# Patient Record
Sex: Female | Born: 1935 | Race: White | Hispanic: No | Marital: Single | Smoking: Never smoker
Health system: Southern US, Community
[De-identification: ages and names within clinical notes are randomized; demographics above are authoritative.]

## PROBLEM LIST (undated history)

## (undated) DIAGNOSIS — I10 Essential (primary) hypertension: Secondary | ICD-10-CM

## (undated) DIAGNOSIS — I5030 Unspecified diastolic (congestive) heart failure: Secondary | ICD-10-CM

## (undated) DIAGNOSIS — R079 Chest pain, unspecified: Secondary | ICD-10-CM

## (undated) DIAGNOSIS — Z7901 Long term (current) use of anticoagulants: Secondary | ICD-10-CM

## (undated) DIAGNOSIS — R609 Edema, unspecified: Secondary | ICD-10-CM

## (undated) DIAGNOSIS — M1990 Unspecified osteoarthritis, unspecified site: Secondary | ICD-10-CM

## (undated) DIAGNOSIS — I4891 Unspecified atrial fibrillation: Secondary | ICD-10-CM

## (undated) DIAGNOSIS — T884XXA Failed or difficult intubation, initial encounter: Secondary | ICD-10-CM

## (undated) DIAGNOSIS — C50919 Malignant neoplasm of unspecified site of unspecified female breast: Secondary | ICD-10-CM

## (undated) HISTORY — DX: Malignant neoplasm of unspecified site of unspecified female breast: C50.919

## (undated) HISTORY — DX: Unspecified diastolic (congestive) heart failure: I50.30

## (undated) HISTORY — DX: Unspecified atrial fibrillation: I48.91

## (undated) HISTORY — PX: KNEE SURGERY: SHX244

## (undated) HISTORY — DX: Chest pain, unspecified: R07.9

## (undated) HISTORY — DX: Unspecified osteoarthritis, unspecified site: M19.90

## (undated) HISTORY — PX: BREAST LUMPECTOMY: SHX2

## (undated) HISTORY — DX: Long term (current) use of anticoagulants: Z79.01

## (undated) HISTORY — PX: HIP SURGERY: SHX245

## (undated) HISTORY — DX: Essential (primary) hypertension: I10

## (undated) HISTORY — PX: LAMINECTOMY: SHX219

## (undated) HISTORY — DX: Edema, unspecified: R60.9

## (undated) HISTORY — PX: TONSILLECTOMY: SUR1361

---

## 2000-02-22 ENCOUNTER — Emergency Department (HOSPITAL_COMMUNITY): Admission: EM | Admit: 2000-02-22 | Discharge: 2000-02-23 | Payer: Self-pay | Admitting: Emergency Medicine

## 2000-02-23 ENCOUNTER — Emergency Department (HOSPITAL_COMMUNITY): Admission: EM | Admit: 2000-02-23 | Discharge: 2000-02-23 | Payer: Self-pay | Admitting: *Deleted

## 2000-02-24 ENCOUNTER — Emergency Department (HOSPITAL_COMMUNITY): Admission: EM | Admit: 2000-02-24 | Discharge: 2000-02-24 | Payer: Self-pay | Admitting: *Deleted

## 2004-10-20 ENCOUNTER — Ambulatory Visit: Payer: Self-pay | Admitting: Physical Medicine & Rehabilitation

## 2004-10-20 ENCOUNTER — Inpatient Hospital Stay (HOSPITAL_COMMUNITY): Admission: RE | Admit: 2004-10-20 | Discharge: 2004-10-23 | Payer: Self-pay | Admitting: Orthopedic Surgery

## 2004-10-23 ENCOUNTER — Inpatient Hospital Stay
Admission: RE | Admit: 2004-10-23 | Discharge: 2004-11-04 | Payer: Self-pay | Admitting: Physical Medicine & Rehabilitation

## 2006-12-13 ENCOUNTER — Emergency Department (HOSPITAL_COMMUNITY): Admission: EM | Admit: 2006-12-13 | Discharge: 2006-12-14 | Payer: Self-pay | Admitting: Emergency Medicine

## 2007-03-21 ENCOUNTER — Ambulatory Visit: Payer: Self-pay | Admitting: Cardiovascular Disease

## 2007-03-21 ENCOUNTER — Encounter: Payer: Self-pay | Admitting: Cardiovascular Disease

## 2007-03-21 ENCOUNTER — Ambulatory Visit: Payer: Self-pay

## 2007-03-30 ENCOUNTER — Ambulatory Visit: Payer: Self-pay | Admitting: Cardiology

## 2007-03-30 ENCOUNTER — Inpatient Hospital Stay (HOSPITAL_COMMUNITY): Admission: AD | Admit: 2007-03-30 | Discharge: 2007-04-06 | Payer: Self-pay | Admitting: Orthopedic Surgery

## 2007-04-28 ENCOUNTER — Ambulatory Visit: Payer: Self-pay | Admitting: Cardiovascular Disease

## 2007-04-28 LAB — CONVERTED CEMR LAB
INR: 2.2 — ABNORMAL HIGH (ref 0.8–1.0)
Prothrombin Time: 18.6 s — ABNORMAL HIGH (ref 10.9–13.3)

## 2007-05-19 ENCOUNTER — Ambulatory Visit: Payer: Self-pay | Admitting: Cardiology

## 2007-06-09 ENCOUNTER — Ambulatory Visit: Payer: Self-pay | Admitting: Cardiovascular Disease

## 2007-06-09 ENCOUNTER — Ambulatory Visit: Payer: Self-pay | Admitting: Internal Medicine

## 2007-06-23 ENCOUNTER — Ambulatory Visit: Payer: Self-pay | Admitting: Internal Medicine

## 2007-07-05 ENCOUNTER — Ambulatory Visit: Payer: Self-pay | Admitting: Cardiovascular Disease

## 2007-07-05 LAB — CONVERTED CEMR LAB
BUN: 22 mg/dL (ref 6–23)
CO2: 31 meq/L (ref 19–32)
Calcium: 9.3 mg/dL (ref 8.4–10.5)
Chloride: 107 meq/L (ref 96–112)
Creatinine, Ser: 0.8 mg/dL (ref 0.4–1.2)
GFR calc Af Amer: 88 mL/min
GFR calc non Af Amer: 73 mL/min
Glucose, Bld: 98 mg/dL (ref 70–99)
Potassium: 4 meq/L (ref 3.5–5.1)
Pro B Natriuretic peptide (BNP): 286 pg/mL — ABNORMAL HIGH (ref 0.0–100.0)
Sodium: 142 meq/L (ref 135–145)

## 2007-07-19 ENCOUNTER — Ambulatory Visit: Payer: Self-pay | Admitting: Cardiology

## 2007-08-05 ENCOUNTER — Ambulatory Visit: Payer: Self-pay | Admitting: Cardiovascular Disease

## 2007-09-05 ENCOUNTER — Ambulatory Visit: Payer: Self-pay | Admitting: Cardiology

## 2007-09-05 ENCOUNTER — Inpatient Hospital Stay (HOSPITAL_COMMUNITY): Admission: RE | Admit: 2007-09-05 | Discharge: 2007-09-14 | Payer: Self-pay | Admitting: Orthopedic Surgery

## 2007-09-09 ENCOUNTER — Encounter (INDEPENDENT_AMBULATORY_CARE_PROVIDER_SITE_OTHER): Payer: Self-pay | Admitting: Orthopedic Surgery

## 2007-09-12 ENCOUNTER — Ambulatory Visit: Payer: Self-pay | Admitting: Vascular Surgery

## 2007-09-12 ENCOUNTER — Encounter (INDEPENDENT_AMBULATORY_CARE_PROVIDER_SITE_OTHER): Payer: Self-pay | Admitting: Orthopedic Surgery

## 2007-10-21 ENCOUNTER — Ambulatory Visit: Payer: Self-pay | Admitting: Cardiovascular Disease

## 2007-10-21 LAB — CONVERTED CEMR LAB
INR: 1.6 — ABNORMAL HIGH (ref 0.8–1.0)
Prothrombin Time: 17.8 s — ABNORMAL HIGH (ref 10.9–13.3)

## 2007-10-27 ENCOUNTER — Ambulatory Visit: Payer: Self-pay | Admitting: Cardiology

## 2007-11-04 ENCOUNTER — Ambulatory Visit: Payer: Self-pay | Admitting: Cardiovascular Disease

## 2007-11-17 ENCOUNTER — Ambulatory Visit: Payer: Self-pay | Admitting: Cardiology

## 2007-11-17 ENCOUNTER — Ambulatory Visit: Payer: Self-pay

## 2007-11-17 ENCOUNTER — Ambulatory Visit: Payer: Self-pay | Admitting: Cardiovascular Disease

## 2007-11-17 LAB — CONVERTED CEMR LAB
BUN: 25 mg/dL — ABNORMAL HIGH (ref 6–23)
CO2: 31 meq/L (ref 19–32)
Calcium: 9.5 mg/dL (ref 8.4–10.5)
Chloride: 99 meq/L (ref 96–112)
Creatinine, Ser: 0.9 mg/dL (ref 0.4–1.2)
GFR calc Af Amer: 77 mL/min
GFR calc non Af Amer: 64 mL/min
Glucose, Bld: 88 mg/dL (ref 70–99)
Potassium: 3.2 meq/L — ABNORMAL LOW (ref 3.5–5.1)
Sodium: 141 meq/L (ref 135–145)

## 2007-12-08 ENCOUNTER — Ambulatory Visit: Payer: Self-pay | Admitting: Cardiology

## 2007-12-08 ENCOUNTER — Ambulatory Visit: Payer: Self-pay | Admitting: Cardiovascular Disease

## 2007-12-08 LAB — CONVERTED CEMR LAB
BUN: 24 mg/dL — ABNORMAL HIGH (ref 6–23)
CO2: 33 meq/L — ABNORMAL HIGH (ref 19–32)
Calcium: 9.5 mg/dL (ref 8.4–10.5)
Chloride: 104 meq/L (ref 96–112)
Creatinine, Ser: 0.9 mg/dL (ref 0.4–1.2)
GFR calc Af Amer: 77 mL/min
GFR calc non Af Amer: 64 mL/min
Glucose, Bld: 99 mg/dL (ref 70–99)
Potassium: 5 meq/L (ref 3.5–5.1)
Sodium: 142 meq/L (ref 135–145)

## 2007-12-29 ENCOUNTER — Ambulatory Visit: Payer: Self-pay | Admitting: Cardiovascular Disease

## 2008-01-12 ENCOUNTER — Ambulatory Visit: Payer: Self-pay | Admitting: Cardiovascular Disease

## 2008-01-12 ENCOUNTER — Ambulatory Visit: Payer: Self-pay | Admitting: Cardiology

## 2008-01-31 ENCOUNTER — Ambulatory Visit: Payer: Self-pay | Admitting: Cardiovascular Disease

## 2008-02-10 LAB — CONVERTED CEMR LAB: Pap Smear: NORMAL

## 2008-02-16 ENCOUNTER — Ambulatory Visit: Payer: Self-pay | Admitting: Cardiology

## 2008-03-08 ENCOUNTER — Ambulatory Visit: Payer: Self-pay | Admitting: Cardiovascular Disease

## 2008-05-03 ENCOUNTER — Ambulatory Visit: Payer: Self-pay | Admitting: Cardiovascular Disease

## 2008-05-17 ENCOUNTER — Ambulatory Visit: Payer: Self-pay | Admitting: Internal Medicine

## 2008-05-31 ENCOUNTER — Ambulatory Visit: Payer: Self-pay | Admitting: Cardiovascular Disease

## 2008-06-21 ENCOUNTER — Ambulatory Visit: Payer: Self-pay | Admitting: Internal Medicine

## 2008-07-19 ENCOUNTER — Ambulatory Visit: Payer: Self-pay | Admitting: Internal Medicine

## 2008-07-20 ENCOUNTER — Encounter (INDEPENDENT_AMBULATORY_CARE_PROVIDER_SITE_OTHER): Payer: Self-pay | Admitting: *Deleted

## 2008-07-31 ENCOUNTER — Encounter: Payer: Self-pay | Admitting: *Deleted

## 2008-08-03 DIAGNOSIS — I4891 Unspecified atrial fibrillation: Secondary | ICD-10-CM | POA: Insufficient documentation

## 2008-08-03 DIAGNOSIS — I10 Essential (primary) hypertension: Secondary | ICD-10-CM | POA: Insufficient documentation

## 2008-08-03 DIAGNOSIS — I5030 Unspecified diastolic (congestive) heart failure: Secondary | ICD-10-CM

## 2008-08-03 DIAGNOSIS — R609 Edema, unspecified: Secondary | ICD-10-CM | POA: Insufficient documentation

## 2008-08-03 DIAGNOSIS — R079 Chest pain, unspecified: Secondary | ICD-10-CM | POA: Insufficient documentation

## 2008-08-03 DIAGNOSIS — C50919 Malignant neoplasm of unspecified site of unspecified female breast: Secondary | ICD-10-CM | POA: Insufficient documentation

## 2008-08-03 HISTORY — DX: Unspecified diastolic (congestive) heart failure: I50.30

## 2008-08-03 HISTORY — DX: Malignant neoplasm of unspecified site of unspecified female breast: C50.919

## 2008-08-06 ENCOUNTER — Ambulatory Visit: Payer: Self-pay | Admitting: Cardiovascular Disease

## 2008-08-06 DIAGNOSIS — M1990 Unspecified osteoarthritis, unspecified site: Secondary | ICD-10-CM | POA: Insufficient documentation

## 2008-08-15 ENCOUNTER — Ambulatory Visit: Payer: Self-pay | Admitting: Cardiology

## 2008-08-15 LAB — CONVERTED CEMR LAB
POC INR: 2.6
Protime: 19.6

## 2008-09-05 ENCOUNTER — Encounter: Payer: Self-pay | Admitting: *Deleted

## 2008-09-19 ENCOUNTER — Ambulatory Visit: Payer: Self-pay | Admitting: Cardiology

## 2008-09-19 LAB — CONVERTED CEMR LAB
POC INR: 1.8
Prothrombin Time: 16.7 s

## 2008-10-03 ENCOUNTER — Ambulatory Visit: Payer: Self-pay | Admitting: Cardiology

## 2008-10-03 LAB — CONVERTED CEMR LAB
POC INR: 2.3
Prothrombin Time: 18.5 s

## 2008-10-25 ENCOUNTER — Ambulatory Visit: Payer: Self-pay | Admitting: Internal Medicine

## 2008-10-26 ENCOUNTER — Encounter (INDEPENDENT_AMBULATORY_CARE_PROVIDER_SITE_OTHER): Payer: Self-pay | Admitting: *Deleted

## 2008-10-26 LAB — CONVERTED CEMR LAB
BUN: 26 mg/dL — ABNORMAL HIGH (ref 6–23)
Basophils Absolute: 0.1 10*3/uL (ref 0.0–0.1)
Basophils Relative: 0.9 % (ref 0.0–3.0)
CO2: 35 meq/L — ABNORMAL HIGH (ref 19–32)
Calcium: 9.5 mg/dL (ref 8.4–10.5)
Chloride: 100 meq/L (ref 96–112)
Cholesterol: 215 mg/dL — ABNORMAL HIGH (ref 0–200)
Creatinine, Ser: 1 mg/dL (ref 0.4–1.2)
Direct LDL: 139.5 mg/dL
Eosinophils Absolute: 0.2 10*3/uL (ref 0.0–0.7)
Eosinophils Relative: 2.6 % (ref 0.0–5.0)
GFR calc non Af Amer: 56.18 mL/min (ref >60)
Glucose, Bld: 89 mg/dL (ref 70–99)
HCT: 42 % (ref 36.0–46.0)
HDL: 53.9 mg/dL (ref >39.00)
Hemoglobin: 14.6 g/dL (ref 12.0–15.0)
Lymphocytes Relative: 20.1 % (ref 12.0–46.0)
Lymphs Abs: 1.5 10*3/uL (ref 0.7–4.0)
MCHC: 34.7 g/dL (ref 30.0–36.0)
MCV: 94 fL (ref 78.0–100.0)
Monocytes Absolute: 0.9 10*3/uL (ref 0.1–1.0)
Monocytes Relative: 11.5 % (ref 3.0–12.0)
Neutro Abs: 4.8 10*3/uL (ref 1.4–7.7)
Neutrophils Relative %: 64.9 % (ref 43.0–77.0)
Platelets: 206 10*3/uL (ref 150.0–400.0)
Potassium: 4.3 meq/L (ref 3.5–5.1)
RBC: 4.46 M/uL (ref 3.87–5.11)
RDW: 13.4 % (ref 11.5–14.6)
Sodium: 142 meq/L (ref 135–145)
TSH: 3.98 microintl units/mL (ref 0.35–5.50)
Total CHOL/HDL Ratio: 4
Triglycerides: 120 mg/dL (ref 0.0–149.0)
VLDL: 24 mg/dL (ref 0.0–40.0)
WBC: 7.5 10*3/uL (ref 4.5–10.5)

## 2008-10-31 ENCOUNTER — Ambulatory Visit: Payer: Self-pay | Admitting: Internal Medicine

## 2008-10-31 LAB — CONVERTED CEMR LAB: POC INR: 2.8

## 2008-11-14 ENCOUNTER — Ambulatory Visit: Payer: Self-pay | Admitting: Internal Medicine

## 2008-11-14 LAB — CONVERTED CEMR LAB: POC INR: 3.2

## 2008-11-21 IMAGING — CR DG SHOULDER 2+V*R*
3 series · 3 of 3 positions shown · non-contrast
Comparison: 10/22/04.

CLINICAL DATA: 84-year-old, osteoarthritis, right shoulder. 
 RIGHT SHOULDER - 3 VIEW:

[x shoulder axillary right]
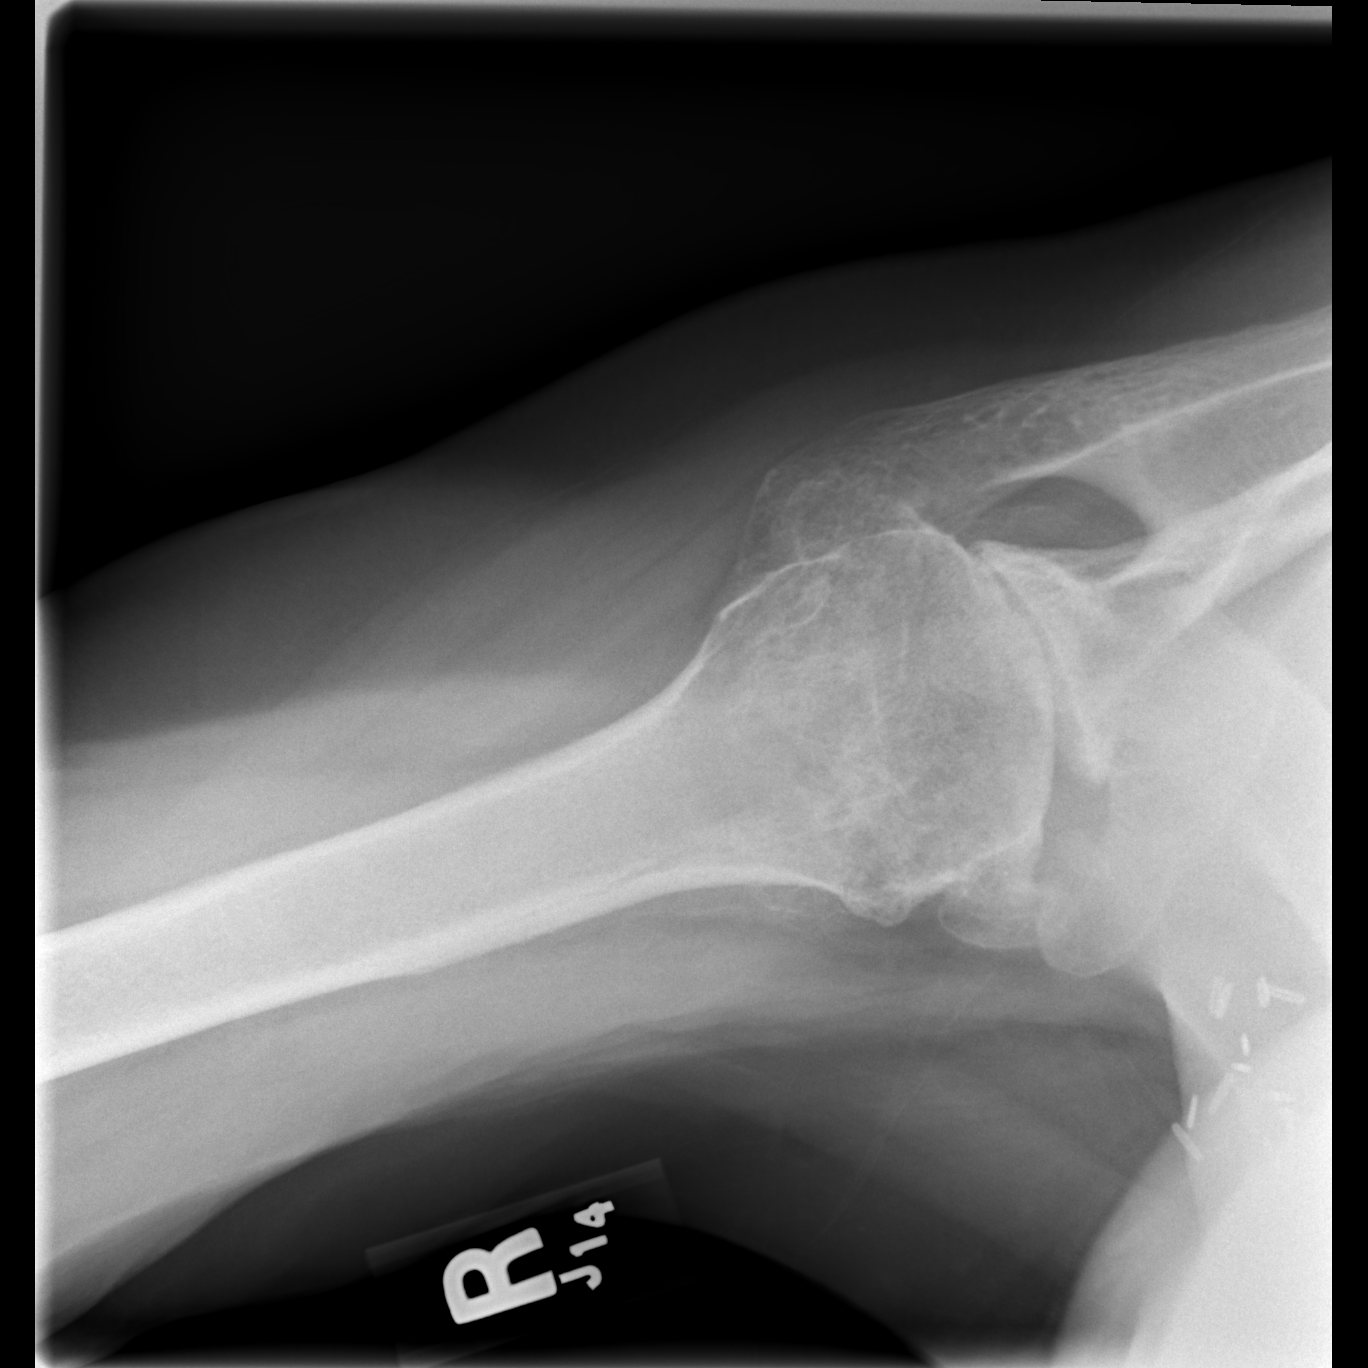

[w shoulder ap internal righ]
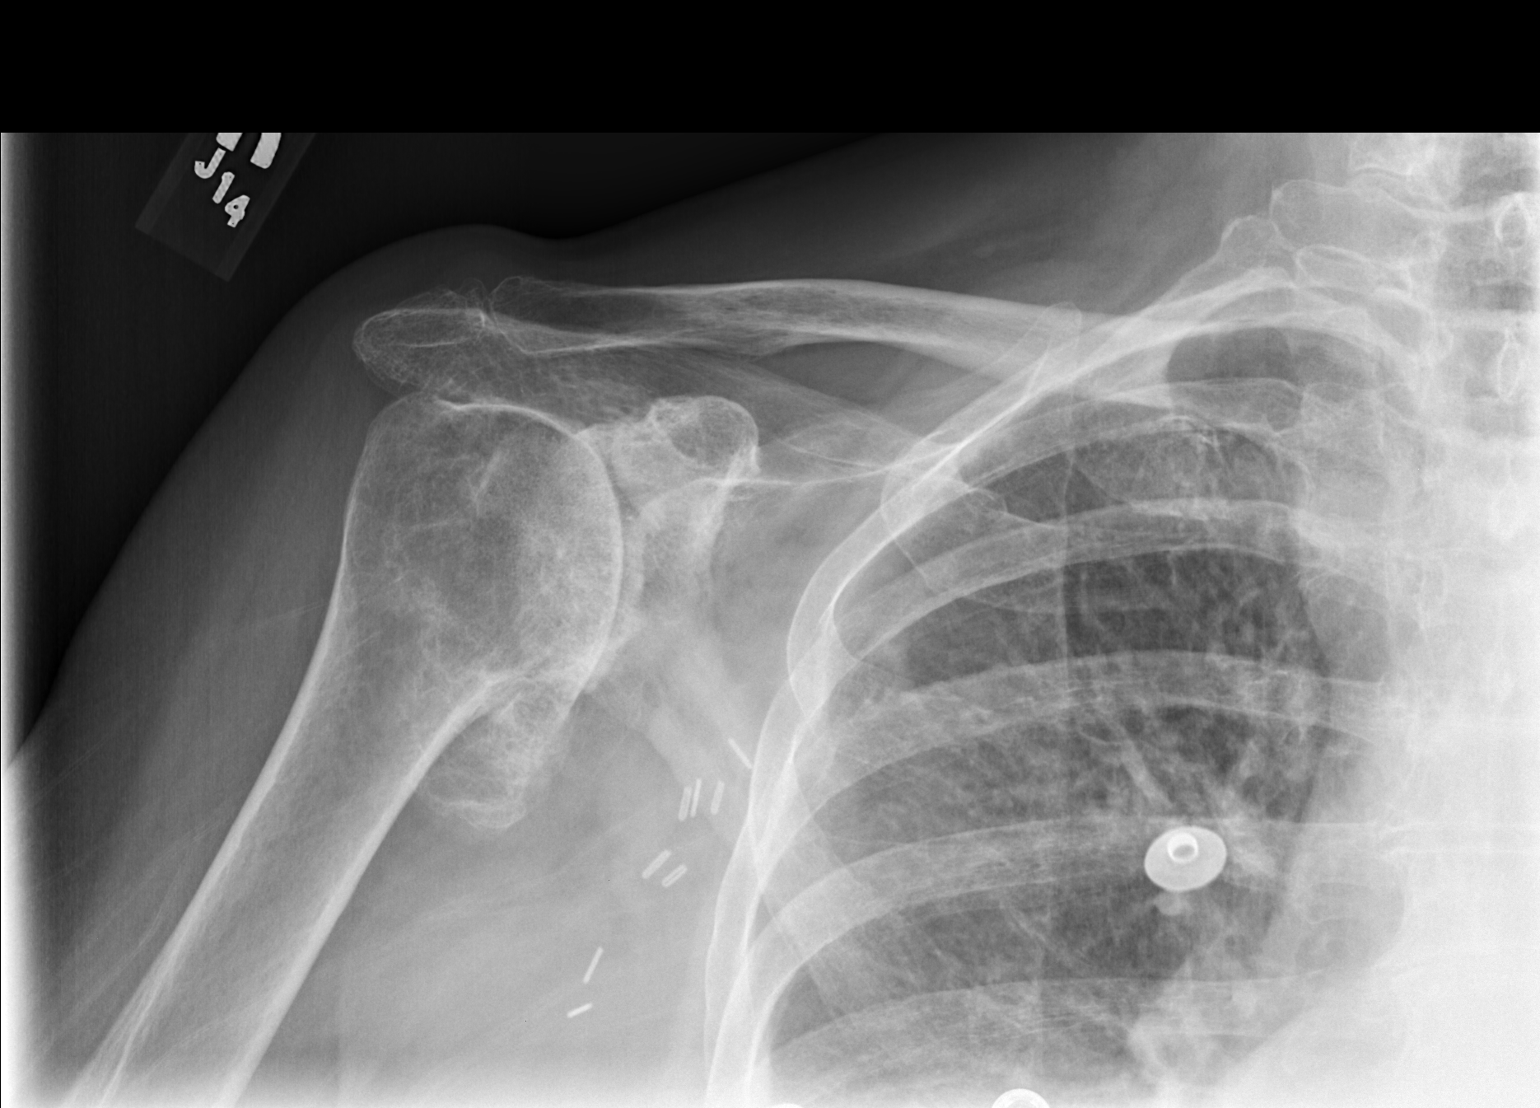

[w shoulder ap external righ]
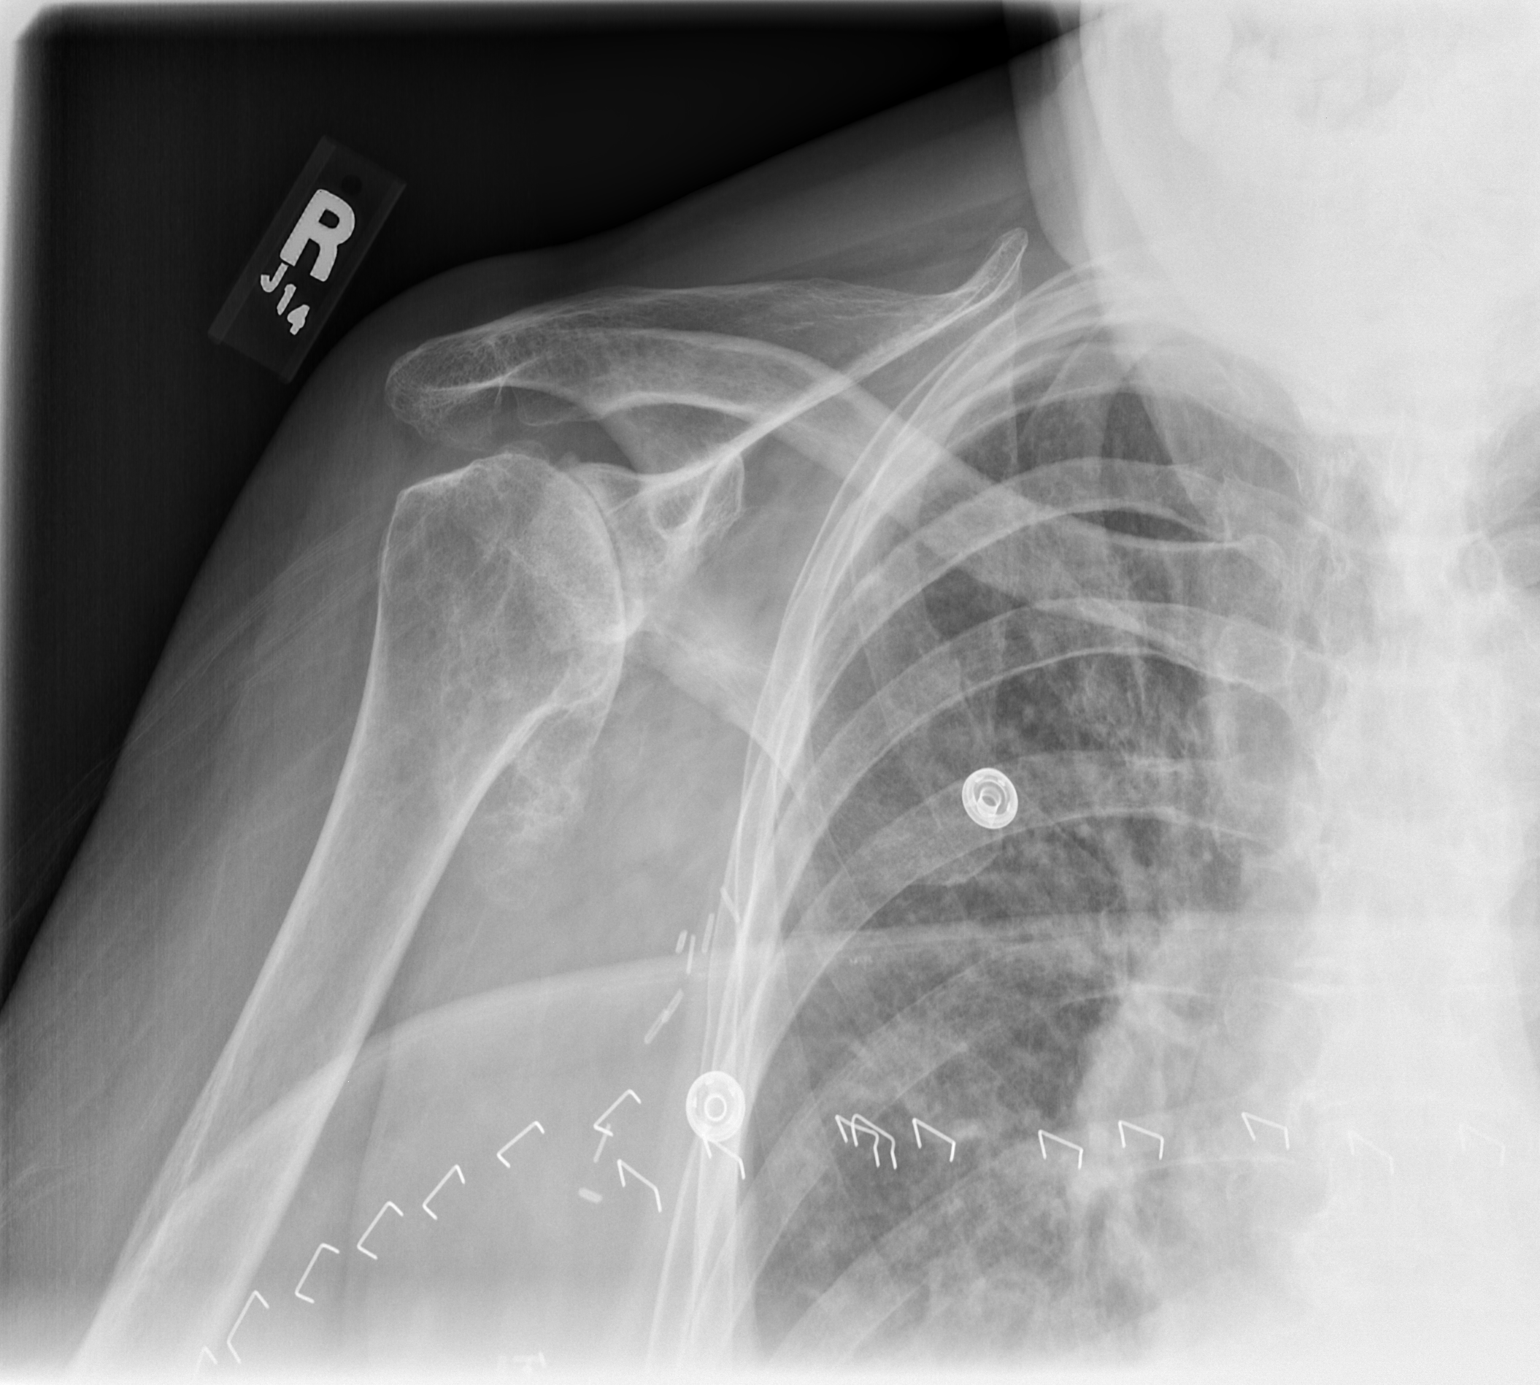

[3 of 3 positions shown; findings below may reference images not displayed]

FINDINGS: Severe and progressive degenerative change involving the right shoulder with marked glenohumeral joint space narrowing, huge osteophytic spur on the inferior aspect of the humeral head, and mild deformity of the humeral head.  No acute bony findings.  The AC joint is intact.  Surgical changes are noted in the right axilla and along the right chest wall.  The clavicle is intact, and the right lung apex is clear.
IMPRESSION: 1.  Severe and progressive glenohumeral joint degenerative changes. 
 2.  No acute bony findings.

## 2008-11-22 IMAGING — CR DG HIP COMPLETE 2+V*R*
1 series · 1 of 1 positions shown · non-contrast
Comparison: Portable right hip 2 views ? [REDACTED] 03/30/07.

CLINICAL DATA: Post-op right hip replacement.  Osteoarthritis with increasing groin pain.
DIAGNOSTIC HIP COMPLETE RIGHT ? 2 VIEWS ? 04/05/07:

[view not recorded]
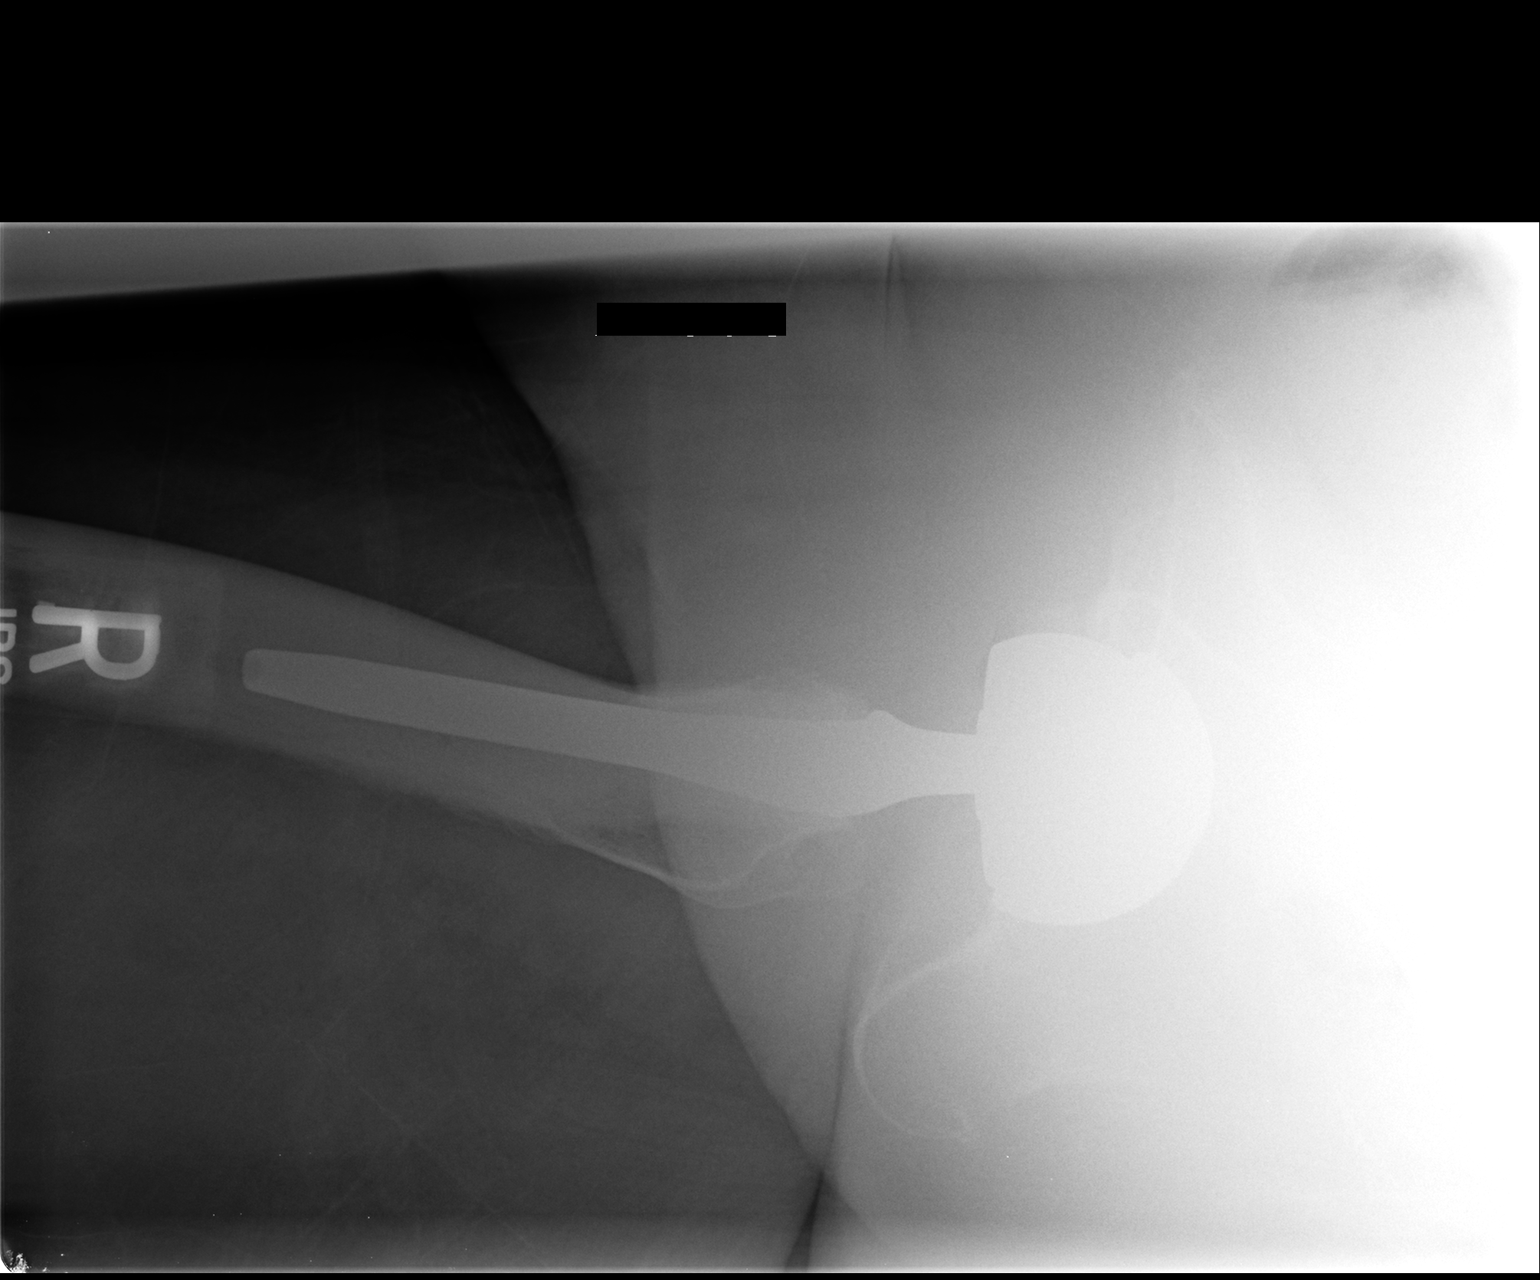

[1 of 1 positions shown; findings below may reference images not displayed]

FINDINGS: Again seen is a total right hip prosthesis in satisfactory position and alignment.  Slight diffuse osteopenia is noted.  The femoral component of the prosthesis is embedded in radiopaque cement.  Visualized right sacroiliac joint and pubic symphysis are unremarkable.  Slight diffuse osteopenia is noted.
IMPRESSION: 1.  Satisfactory position placement right hip prosthesis.
2.  Slight diffuse osteopenia.
3.  No acute findings.

## 2008-12-11 ENCOUNTER — Ambulatory Visit (HOSPITAL_COMMUNITY): Admission: RE | Admit: 2008-12-11 | Discharge: 2008-12-11 | Payer: Self-pay | Admitting: Orthopedic Surgery

## 2008-12-19 ENCOUNTER — Encounter (INDEPENDENT_AMBULATORY_CARE_PROVIDER_SITE_OTHER): Payer: Self-pay | Admitting: *Deleted

## 2008-12-19 ENCOUNTER — Ambulatory Visit: Payer: Self-pay | Admitting: Internal Medicine

## 2008-12-19 LAB — CONVERTED CEMR LAB: POC INR: 2.8

## 2008-12-21 ENCOUNTER — Encounter: Admission: RE | Admit: 2008-12-21 | Discharge: 2008-12-21 | Payer: Self-pay | Admitting: Orthopedic Surgery

## 2009-01-17 ENCOUNTER — Ambulatory Visit: Payer: Self-pay | Admitting: Cardiology

## 2009-01-17 LAB — CONVERTED CEMR LAB: POC INR: 2.3

## 2009-02-06 ENCOUNTER — Encounter: Payer: Self-pay | Admitting: Cardiovascular Disease

## 2009-02-13 ENCOUNTER — Ambulatory Visit: Payer: Self-pay | Admitting: Cardiology

## 2009-02-13 LAB — CONVERTED CEMR LAB: POC INR: 3.6

## 2009-02-27 ENCOUNTER — Ambulatory Visit: Payer: Self-pay | Admitting: Cardiology

## 2009-02-27 LAB — CONVERTED CEMR LAB: POC INR: 2.3

## 2009-03-20 ENCOUNTER — Ambulatory Visit: Payer: Self-pay | Admitting: Cardiovascular Disease

## 2009-03-20 LAB — CONVERTED CEMR LAB: POC INR: 2.5

## 2009-04-17 ENCOUNTER — Ambulatory Visit: Payer: Self-pay | Admitting: Internal Medicine

## 2009-04-17 LAB — CONVERTED CEMR LAB: POC INR: 2.5

## 2009-05-22 ENCOUNTER — Ambulatory Visit: Payer: Self-pay | Admitting: Internal Medicine

## 2009-05-22 LAB — CONVERTED CEMR LAB: POC INR: 3.1

## 2009-06-26 ENCOUNTER — Ambulatory Visit: Payer: Self-pay | Admitting: Internal Medicine

## 2009-06-26 LAB — CONVERTED CEMR LAB: POC INR: 2.1

## 2009-07-17 ENCOUNTER — Ambulatory Visit: Payer: Self-pay | Admitting: Cardiology

## 2009-07-17 LAB — CONVERTED CEMR LAB: POC INR: 2.4

## 2009-07-31 ENCOUNTER — Telehealth (INDEPENDENT_AMBULATORY_CARE_PROVIDER_SITE_OTHER): Payer: Self-pay | Admitting: *Deleted

## 2009-08-08 ENCOUNTER — Telehealth: Payer: Self-pay | Admitting: Internal Medicine

## 2009-08-09 ENCOUNTER — Ambulatory Visit: Payer: Self-pay | Admitting: Cardiovascular Disease

## 2009-08-09 LAB — CONVERTED CEMR LAB: POC INR: 2

## 2009-09-06 ENCOUNTER — Ambulatory Visit: Payer: Self-pay | Admitting: Cardiology

## 2009-09-06 LAB — CONVERTED CEMR LAB: POC INR: 3.1

## 2009-09-26 ENCOUNTER — Ambulatory Visit: Payer: Self-pay | Admitting: Internal Medicine

## 2009-09-26 ENCOUNTER — Ambulatory Visit: Payer: Self-pay | Admitting: Cardiovascular Disease

## 2009-09-26 LAB — CONVERTED CEMR LAB: POC INR: 2.8

## 2009-10-22 ENCOUNTER — Ambulatory Visit: Payer: Self-pay | Admitting: Cardiovascular Disease

## 2009-11-20 ENCOUNTER — Ambulatory Visit: Payer: Self-pay | Admitting: Cardiology

## 2009-11-20 LAB — CONVERTED CEMR LAB: POC INR: 2.2

## 2009-12-11 ENCOUNTER — Ambulatory Visit: Payer: Self-pay | Admitting: Cardiology

## 2009-12-11 LAB — CONVERTED CEMR LAB: POC INR: 2.5

## 2010-01-15 ENCOUNTER — Ambulatory Visit: Payer: Self-pay | Admitting: Cardiology

## 2010-01-15 LAB — CONVERTED CEMR LAB
INR: 1.7
POC INR: 1.7

## 2010-01-21 ENCOUNTER — Encounter: Payer: Self-pay | Admitting: Cardiovascular Disease

## 2010-01-29 ENCOUNTER — Ambulatory Visit: Payer: Self-pay | Admitting: Internal Medicine

## 2010-01-29 LAB — CONVERTED CEMR LAB: POC INR: 3

## 2010-02-19 ENCOUNTER — Ambulatory Visit: Payer: Self-pay | Admitting: Cardiology

## 2010-02-19 LAB — CONVERTED CEMR LAB: POC INR: 3.2

## 2010-03-06 ENCOUNTER — Ambulatory Visit: Admission: RE | Admit: 2010-03-06 | Discharge: 2010-03-06 | Payer: Self-pay | Source: Home / Self Care

## 2010-03-06 LAB — CONVERTED CEMR LAB: POC INR: 3.3

## 2010-03-20 ENCOUNTER — Ambulatory Visit: Admission: RE | Admit: 2010-03-20 | Discharge: 2010-03-20 | Payer: Self-pay | Source: Home / Self Care

## 2010-03-20 LAB — CONVERTED CEMR LAB: POC INR: 3

## 2010-03-27 ENCOUNTER — Encounter
Admission: RE | Admit: 2010-03-27 | Discharge: 2010-03-27 | Payer: Self-pay | Source: Home / Self Care | Attending: Orthopedic Surgery | Admitting: Orthopedic Surgery

## 2010-04-01 NOTE — Progress Notes (Signed)
Summary: dental procedure  Phone Note From Other Clinic Call back at [phone]   Caller: Marley Call For: Coumadin Clinic Summary of Call: Marley from Dr. Annabelle Harman' office (dentist) in [facility] called.  They need to extract a tooth and do a filling for pt.  Wanted to make sure this was okay since she was on Coumadin.  Instructed them that as long as it was a simple extraction, pt would be fine staying on Coumadin for procedure.  They are going to wait until pt has INR checked next Friday to schedule the procedure.  Initial call taken by: Weston Brass PharmD,  July 31, 2009 2:38 PM

## 2010-04-01 NOTE — Medication Information (Signed)
Summary: [name]  Anticoagulant Therapy  Managed by: Bethena Midget, RN, BSN Referring MD: Charlton Haws MD PCP: Newt Lukes MD Supervising MD: Johney Frame MD, Fayrene Fearing Indication 1: Atrial Fibrillation (ICD-427.31) Lab Used: [facility] Site: [facility] INR POC 2.5 INR RANGE 2 - 3  Dietary changes: no    Health status changes: no    Bleeding/hemorrhagic complications: no    Recent/future hospitalizations: no    Any changes in medication regimen? no    Recent/future dental: no  Any missed doses?: no       Is patient compliant with meds? yes       Allergies: 1)  Demerol 2)  Percocet  Anticoagulation Management History:      The patient is taking warfarin and comes in today for a routine follow up visit.  Positive risk factors for bleeding include an age of 75 years or older.  The bleeding index is 'intermediate risk'.  Positive CHADS2 values include History of CHF, History of HTN, and Age > 4 years old.  The start date was 04/22/2007.  Her last INR was 1.6 RATIO.  Anticoagulation responsible provider: Eudell Julian MD, Fayrene Fearing.  INR POC: 2.5.  Cuvette Lot#: [id number].  Exp: 06/2010.    Anticoagulation Management Assessment/Plan:      The patient's current anticoagulation dose is Warfarin sodium 6 mg tabs: Use as directed by Anticoagulation Clinic.  The target INR is 2 - 3.  The next INR is due 05/22/2009.  Anticoagulation instructions were given to patient.  Results were reviewed/authorized by Bethena Midget, RN, BSN.  She was notified by Bethena Midget, RN, BSN.         Prior Anticoagulation Instructions: INR 2.5  Continue same dose of 1.5 tablets daily except 1 tablet on Tuesdays, Thursdays, and Saturdays. Recheck in 4 weeks.  Current Anticoagulation Instructions: INR 2.5 Continue 9mg s daily except 6mg s on Tuesdays, Thursdays and Saturdays. Recheck in 4 weeks.

## 2010-04-01 NOTE — Medication Information (Signed)
Summary: rov/tm  Anticoagulant Therapy  Managed by: Bethena Midget, RN, BSN Referring MD: Charlton Haws MD PCP: Newt Lukes MD Supervising MD: Gala Romney MD, Reuel Boom Indication 1: Atrial Fibrillation (ICD-427.31) Lab Used: [facility] Site: [facility] INR POC 2.1 INR RANGE 2 - 3  Dietary changes: no    Health status changes: no    Bleeding/hemorrhagic complications: no    Recent/future hospitalizations: no    Any changes in medication regimen? no    Recent/future dental: no  Any missed doses?: no         Allergies: 1)  Demerol 2)  Percocet  Anticoagulation Management History:      The patient is taking warfarin and comes in today for a routine follow up visit.  Positive risk factors for bleeding include an age of 75 years or older.  The bleeding index is 'intermediate risk'.  Positive CHADS2 values include History of CHF, History of HTN, and Age > 50 years old.  The start date was 04/22/2007.  Her last INR was 1.6 RATIO.  Anticoagulation responsible provider: Almond Fitzgibbon MD, Reuel Boom.  INR POC: 2.1.  Cuvette Lot#: [id number].  Exp: 08/2010.    Anticoagulation Management Assessment/Plan:      The patient's current anticoagulation dose is Warfarin sodium 6 mg tabs: Take 1 1/2 tablet every day except 1 tablet on Tuesday, Thursday and Saturday or as directed by Anticoagulation Clinic.  The target INR is 2 - 3.  The next INR is due 07/17/2009.  Anticoagulation instructions were given to patient.  Results were reviewed/authorized by Bethena Midget, RN, BSN.  She was notified by Bethena Midget, RN, BSN.         Prior Anticoagulation Instructions: INR 3.1 Today take only 3mg s then resume 9mg s daily except 6mg s on Tuesdays,  Thursdays and Saturdays. Recheck in 4 weeks.   Current Anticoagulation Instructions: INR 2.1 Continue 9mg s daily except 6mg s on Tuesdays, Thursdays and Saturdays. Recheck in 3 weeks.

## 2010-04-01 NOTE — Medication Information (Signed)
Summary: rov/lr  Anticoagulant Therapy  Managed by: Shelby Dubin, PharmD, BCPS, CPP Referring MD: Charlton Haws MD Supervising MD: Daleen Squibb MD, Maisie Fus Indication 1: Atrial Fibrillation (ICD-427.31) Lab Used: [facility] Site: [facility] PT 16.7 INR POC 1.8 INR RANGE 2 - 3  Dietary changes: no    Health status changes: no    Bleeding/hemorrhagic complications: no    Recent/future hospitalizations: no    Any changes in medication regimen? no    Recent/future dental: no  Any missed doses?: no       Is patient compliant with meds? yes       Current Medications (verified): 1)  Vitamin C 500 Mg Tabs (Ascorbic Acid) .Marland Kitchen.. 1 Tab By Mouth Once Daily 2)  Calcium 500 Mg Tabs (Calcium Carbonate) .... 2 Tab By Mouth Once Daily 3)  Cvs Vitamin D 2000 Unit Tabs (Cholecalciferol) .Marland Kitchen.. 1 Tab By Mouth Once Daily 4)  Folic Acid .Marland Kitchen.. 1 Tab By Mouth Once Daily 5)  Digoxin 0.125 Mg Tabs (Digoxin) .Marland Kitchen.. 1 Tab By Mouth Once Daily 6)  Metoprolol Succinate 50 Mg Xr24h-Tab (Metoprolol Succinate) .Marland Kitchen.. 1 and 1/2 Two Times A Day 7)  Coumadin 6 Mg Tabs (Warfarin Sodium) .Marland Kitchen.. 1 Tab Tues, Thur. Sat 1 1/2 The Rest of The Week 8)  Oscal 500/200 D-3 500-200 Mg-Unit Tabs (Calcium-Vitamin D) .... 2 Tabs By Mouth Once Daily 9)  Fish Oil   Oil (Fish Oil) .Marland Kitchen.. 1 Tab By Mouth Once Daily 10)  Glucosamine-Chondroitin   Caps (Glucosamine-Chondroit-Vit C-Mn) .Marland Kitchen.. 1 Tab By Mouth Once Daily 11)  Torsemide 20 Mg Tabs (Torsemide) .... 2 Tabs By Mouth Once Daily 12)  Potassium Chloride Crys Cr 20 Meq Cr-Tabs (Potassium Chloride Crys Cr) .... 2 Tabs By Mouth Once Daily 13)  Klor-Con M20 20 Meq Cr-Tabs (Potassium Chloride Crys Cr) .Marland Kitchen.. 1 Tablet By Mouth Twice A Day 14)  Toprol Xl 50 Mg Xr24h-Tab (Metoprolol Succinate) .... Take 1 1/2 Tablet By Mouth Twice A Day 15)  Warfarin Sodium 6 Mg Tabs (Warfarin Sodium) .... Take As Directed  Allergies (verified): 1)  Demerol 2)  Percocet  Anticoagulation Management History:  The patient is taking warfarin and comes in today for a routine follow up visit.  Positive risk factors for bleeding include an age of 75 years or older.  The bleeding index is 'intermediate risk'.  Positive CHADS2 values include History of CHF, History of HTN, and Age > 75 years old.  The start date was 04/22/2007.  Her last INR was 1.6 RATIO.  Prothrombin time is 16.7.  Anticoagulation responsible provider: Daleen Squibb MD, Maisie Fus.  INR POC: 1.8.  Cuvette Lot#: [id number].  Exp: 07/11.    Anticoagulation Management Assessment/Plan:      The patient's current anticoagulation dose is Coumadin 6 mg tabs: 1 tab tues, thur. sat 1 1/2 the rest of the week, Warfarin sodium 6 mg tabs: Take as directed.  The target INR is 2 - 3.  The next INR is due 10/03/2008.  Anticoagulation instructions were given to patient.  Results were reviewed/authorized by Shelby Dubin, PharmD, BCPS, CPP.  She was notified by Cloyde Reams RN.         Prior Anticoagulation Instructions: Continue 9mg  once daily except 6mg  on Tues, Thurs, Sat.  Current Anticoagulation Instructions: INR 1.8  Take 2 tablets today then resume 1 and 1/2 tablets daily except 1 tablet on Tuesdays, Thursdays and Saturdays.  Recheck in 2 weeks.  Appended Document: rov/lr  Reviewed Juanito Doom, MD

## 2010-04-01 NOTE — Medication Information (Signed)
Summary: rov/tm  Anticoagulant Therapy  Managed by: Weston Brass, PharmD Referring MD: Charlton Haws MD PCP: Newt Lukes MD Supervising MD: Clifton James MD, Cristal Deer Indication 1: Atrial Fibrillation (ICD-427.31) Lab Used: [facility] Site: [facility] INR POC 2.0 INR RANGE 2 - 3  Dietary changes: no    Health status changes: no    Bleeding/hemorrhagic complications: no    Recent/future hospitalizations: no    Any changes in medication regimen? no    Recent/future dental: yes     Details: having tooth pulled in near future; not scheduled yet  Any missed doses?: no       Is patient compliant with meds? yes       Allergies: 1)  Demerol 2)  Percocet  Anticoagulation Management History:      The patient is taking warfarin and comes in today for a routine follow up visit.  Positive risk factors for bleeding include an age of 75 years or older.  The bleeding index is 'intermediate risk'.  Positive CHADS2 values include History of CHF, History of HTN, and Age > 30 years old.  The start date was 04/22/2007.  Her last INR was 1.6 RATIO.  Anticoagulation responsible provider: Clifton James MD, Cristal Deer.  INR POC: 2.0.  Cuvette Lot#: [id number].  Exp: 10/2010.    Anticoagulation Management Assessment/Plan:      The patient's current anticoagulation dose is Warfarin sodium 6 mg tabs: Take 1 1/2 tablet every day except 1 tablet on Tuesday, Thursday and Saturday or as directed by Anticoagulation Clinic.  The target INR is 2 - 3.  The next INR is due 09/06/2009.  Anticoagulation instructions were given to patient.  Results were reviewed/authorized by Weston Brass, PharmD.  She was notified by Weston Brass PharmD.         Prior Anticoagulation Instructions: INR 2.4 Continue 9mg  daily except 6mg  on Tuesdays, Thursdays and Saturdays. Recheck in 4 weeks.  Current Anticoagulation Instructions: INR 2.0  Continue same dose of 1 1/2 tablets every day except 1 tablet on Tuesday, Thursday and  Saturday.

## 2010-04-01 NOTE — Medication Information (Signed)
Summary: rov/lr  Anticoagulant Therapy  Managed by: Vashti Hey, RN Referring MD: Charlton Haws MD Supervising MD: Shirlee Latch MD, Areliz Rothman Indication 1: Atrial Fibrillation (ICD-427.31) Lab Used: [facility] 19.6 INR POC 2.6  Dietary changes: no    Health status changes: no    Bleeding/hemorrhagic complications: no    Recent/future hospitalizations: no    Any changes in medication regimen? no    Recent/future dental: no  Any missed doses?: no       Is patient compliant with meds? yes       Allergies (verified): 1)  Demerol 2)  Percocet  Anticoagulation Management History:      Positive risk factors for bleeding include an age of 75 years or older.  The bleeding index is 'intermediate risk'.  Positive CHADS2 values include History of CHF, History of HTN, and Age > 73 years old.  The start date was 04/22/2007.  Her last INR was 1.6 RATIO.    Anticoagulation Management Assessment/Plan:      The patient's current anticoagulation dose is Coumadin 6 mg tabs: 1 tab tues, thur. sat 1 1/2 the rest of the week, Warfarin sodium 6 mg tabs: Take as directed.  She is to have a 08/20/2008.  Anticoagulation instructions were given to patient.  Results were reviewed/authorized by Vashti Hey, RN.  She was notified by Vashti Hey RN.         Prior Anticoagulation Instructions: 6MG  T,T,SAT/ 9MG  QD  Current Anticoagulation Instructions: Continue 9mg  once daily except 6mg  on Tues, Thurs, Sat.

## 2010-04-01 NOTE — Medication Information (Signed)
Summary: rov/jaj  Anticoagulant Therapy  Managed by: Weston Brass, PharmD Referring MD: Charlton Haws MD PCP: Newt Lukes MD Supervising MD: Daleen Squibb MD, Maisie Fus Indication 1: Atrial Fibrillation (ICD-427.31) Lab Used: [facility] Site: [facility] INR POC 2.5 INR RANGE 2 - 3  Dietary changes: no    Health status changes: no    Bleeding/hemorrhagic complications: no    Recent/future hospitalizations: no    Any changes in medication regimen? no    Recent/future dental: no  Any missed doses?: no       Is patient compliant with meds? yes       Allergies: 1)  Demerol 2)  Percocet  Anticoagulation Management History:      The patient is taking warfarin and comes in today for a routine follow up visit.  Positive risk factors for bleeding include an age of 64 years or older.  The bleeding index is 'intermediate risk'.  Positive CHADS2 values include History of CHF, History of HTN, and Age > 85 years old.  The start date was 04/22/2007.  Her last INR was 1.6 RATIO.  Anticoagulation responsible provider: Daleen Squibb MD, Maisie Fus.  INR POC: 2.5.  Cuvette Lot#: [id number].  Exp: 01/2011.    Anticoagulation Management Assessment/Plan:      The patient's current anticoagulation dose is Warfarin sodium 6 mg tabs: Take 1 1/2 tablet every day except 1 tablet on Tuesday, Thursday and Saturday or as directed by Anticoagulation Clinic.  The target INR is 2 - 3.  The next INR is due 01/15/2010.  Anticoagulation instructions were given to patient.  Results were reviewed/authorized by Weston Brass, PharmD.  She was notified by Ilean Skill D candidate.         Prior Anticoagulation Instructions: INR 2.2  Continue taking 1 1/2 tablets everyday except take 1 tablet on Tuesdays, Thursdays, and Saturdays. Re-check INR in 4 weeks.   Current Anticoagulation Instructions: INR 2.5  Continue taking 1 1/2 tablets everyday except 1 tablet on Tuesday, Thursday, and Saturday. Recheck in 5 weeks. (pt's brother  requested this and she gets a ride with him)

## 2010-04-01 NOTE — Medication Information (Signed)
Summary: rov/nb  Anticoagulant Therapy  Managed by: Weston Brass, PharmD Referring MD: Charlton Haws MD PCP: Newt Lukes MD Supervising MD: Gala Romney MD, Reuel Boom Indication 1: Atrial Fibrillation (ICD-427.31) Lab Used: [facility] Site: [facility] INR POC 3.0 INR RANGE 2 - 3  Dietary changes: no    Health status changes: no    Bleeding/hemorrhagic complications: no    Recent/future hospitalizations: no    Any changes in medication regimen? yes       Details: taking extra tylenol for pain  Recent/future dental: no  Any missed doses?: no       Is patient compliant with meds? yes       Allergies: 1)  Demerol 2)  Percocet  Anticoagulation Management History:      The patient is taking warfarin and comes in today for a routine follow up visit.  Positive risk factors for bleeding include an age of 1 years or older.  The bleeding index is 'intermediate risk'.  Positive CHADS2 values include History of CHF, History of HTN, and Age > 58 years old.  The start date was 04/22/2007.  Her last INR was 1.7.  Anticoagulation responsible provider: Theophilus Walz MD, Reuel Boom.  INR POC: 3.0.  Cuvette Lot#: [id number].  Exp: 01/2011.    Anticoagulation Management Assessment/Plan:      The patient's current anticoagulation dose is Warfarin sodium 6 mg tabs: Take 1 1/2 tablet every day except 1 tablet on Tuesday, Thursday and Saturday or as directed by Anticoagulation Clinic.  The target INR is 2 - 3.  The next INR is due 02/19/2010.  Anticoagulation instructions were given to patient.  Results were reviewed/authorized by Weston Brass, PharmD.  She was notified by Weston Brass PharmD.         Prior Anticoagulation Instructions: INR 1.7 Patient to increase today dose to 2tabs and tomorrow 1.5 tabs and return to dose of 1.5 Sun, Mon , Wed and Frid and 1tab on Tue and Thur. Recheck in 10-14 days. Continue eating green leafy vegetables as normal each week.  Current Anticoagulation Instructions: INR  3.0  Continue same dose of 1 1/2 tablets every day except 1 tablet on Tuesday, Thursday and Saturday.  Recheck INR in 3 weeks.

## 2010-04-01 NOTE — Medication Information (Signed)
Summary: rov/tm  Anticoagulant Therapy  Managed by: Weston Brass, PharmD Referring MD: Charlton Haws MD PCP: Newt Lukes MD Supervising MD: Clifton James MD, Cristal Deer Indication 1: Atrial Fibrillation (ICD-427.31) Lab Used: [facility] Site: [facility] INR RANGE 2 - 3  Dietary changes: no    Health status changes: no    Bleeding/hemorrhagic complications: no    Recent/future hospitalizations: no    Any changes in medication regimen? no    Recent/future dental: no  Any missed doses?: no       Is patient compliant with meds? yes       Allergies: 1)  Demerol 2)  Percocet  Anticoagulation Management History:      The patient is taking warfarin and comes in today for a routine follow up visit.  Positive risk factors for bleeding include an age of 75 years or older.  The bleeding index is 'intermediate risk'.  Positive CHADS2 values include History of CHF, History of HTN, and Age > 75 years old.  The start date was 04/22/2007.  Her last INR was 1.6 RATIO.  Anticoagulation responsible provider: Clifton James MD, Cristal Deer.  Cuvette Lot#: [id number].  Exp: 12/2010.    Anticoagulation Management Assessment/Plan:      The patient's current anticoagulation dose is Warfarin sodium 6 mg tabs: Take 1 1/2 tablet every day except 1 tablet on Tuesday, Thursday and Saturday or as directed by Anticoagulation Clinic.  The target INR is 2 - 3.  The next INR is due 11/20/2009.  Anticoagulation instructions were given to patient.  Results were reviewed/authorized by Weston Brass, PharmD.  She was notified by Gweneth Fritter, PharmD Candidate.         Prior Anticoagulation Instructions: INR 2.8  Continue same dose of 1.5 tabs daily except for 1 tab on Tuesday, Thursday, and Saturday.  Re-check in 4 weeks. Schedule appt with brother Willadene Mounsey).   Current Anticoagulation Instructions: INR 1.9  Tonight, take 1.5 tablets (9mg ).  Then resume normal schedule of taking 1.5 tablets (9mg ) every day  except take 1 tablet (6mg ) on Tuesday and Thursday.  Recheck in 4 weeks.

## 2010-04-01 NOTE — Medication Information (Signed)
Summary: rov/tm  Anticoagulant Therapy  Managed by: Bethena Midget, RN, BSN Referring MD: Charlton Haws MD PCP: Newt Lukes MD Supervising MD: Antoine Poche MD, Fayrene Fearing Indication 1: Atrial Fibrillation (ICD-427.31) Lab Used: [facility] Site: [facility] INR POC 3.6 INR RANGE 2 - 3  Dietary changes: no    Health status changes: no    Bleeding/hemorrhagic complications: no    Recent/future hospitalizations: no    Any changes in medication regimen? no    Recent/future dental: no  Any missed doses?: no       Is patient compliant with meds? yes       Allergies: 1)  Demerol 2)  Percocet  Anticoagulation Management History:      The patient is taking warfarin and comes in today for a routine follow up visit.  Positive risk factors for bleeding include an age of 75 years or older.  The bleeding index is 'intermediate risk'.  Positive CHADS2 values include History of CHF, History of HTN, and Age > 37 years old.  The start date was 04/22/2007.  Her last INR was 1.6 RATIO.  Anticoagulation responsible provider: Antoine Poche MD, Fayrene Fearing.  INR POC: 3.6.  Cuvette Lot#: [id number].  Exp: 04/2010.    Anticoagulation Management Assessment/Plan:      The patient's current anticoagulation dose is Warfarin sodium 6 mg tabs: Use as directed by Anticoagulation Clinic.  The target INR is 2 - 3.  The next INR is due 02/27/2009.  Anticoagulation instructions were given to patient.  Results were reviewed/authorized by Bethena Midget, RN, BSN.  She was notified by Bethena Midget, RN, BSN.         Prior Anticoagulation Instructions: INR 2.3 Continue 9mg s daily except 6mg s on Tuesdays, Thursdays and Saturdays. Recheck in 4 weeks.   Current Anticoagulation Instructions: INR 3.6 Skip todays dose, then resume 9mg s daily except 6mg s on Tuesdays, Thursdays and Saturdays. Recheck in 2 weeks.

## 2010-04-01 NOTE — Medication Information (Signed)
Summary: rov/td  Anticoagulant Therapy  Managed by: Bethena Midget, RN, BSN Referring MD: Charlton Haws MD PCP: Newt Lukes MD Supervising MD: Antoine Poche MD, Fayrene Fearing Indication 1: Atrial Fibrillation (ICD-427.31) Lab Used: [facility] Site: [facility] INR POC 2.3 INR RANGE 2 - 3  Dietary changes: no    Health status changes: no    Bleeding/hemorrhagic complications: no    Recent/future hospitalizations: no    Any changes in medication regimen? no    Recent/future dental: no  Any missed doses?: no       Is patient compliant with meds? yes       Allergies: 1)  Demerol 2)  Percocet  Anticoagulation Management History:      The patient is taking warfarin and comes in today for a routine follow up visit.  Positive risk factors for bleeding include an age of 75 years or older.  The bleeding index is 'intermediate risk'.  Positive CHADS2 values include History of CHF, History of HTN, and Age > 57 years old.  The start date was 04/22/2007.  Her last INR was 1.6 RATIO.  Anticoagulation responsible provider: Antoine Poche MD, Fayrene Fearing.  INR POC: 2.3.  Cuvette Lot#: [id number].  Exp: 11/2009.    Anticoagulation Management Assessment/Plan:      The patient's current anticoagulation dose is Warfarin sodium 6 mg tabs: Use as directed by Anticoagulation Clinic.  The target INR is 2 - 3.  The next INR is due 02/13/2009.  Anticoagulation instructions were given to patient.  Results were reviewed/authorized by Bethena Midget, RN, BSN.  She was notified by Bethena Midget, RN, BSN.         Prior Anticoagulation Instructions: INR 2.8  Take1 tablet (6 mg) Tuesday, Thursday, and Saturday. Take 1.5 tablets (9 mg) all the other days of the week.  Current Anticoagulation Instructions: INR 2.3 Continue 9mg s daily except 6mg s on Tuesdays, Thursdays and Saturdays. Recheck in 4 weeks.

## 2010-04-01 NOTE — Medication Information (Signed)
Summary: rov/sp  Anticoagulant Therapy  Managed by: Bethena Midget, RN, BSN Referring MD: Charlton Haws MD PCP: Newt Lukes MD Supervising MD: Shirlee Latch MD, Thea Holshouser Indication 1: Atrial Fibrillation (ICD-427.31) Lab Used: [facility] Site: [facility] INR POC 2.4 INR RANGE 2 - 3  Dietary changes: no    Health status changes: no    Bleeding/hemorrhagic complications: no    Recent/future hospitalizations: no    Any changes in medication regimen? no    Recent/future dental: no  Any missed doses?: no       Is patient compliant with meds? yes       Allergies: 1)  Demerol 2)  Percocet  Anticoagulation Management History:      The patient is taking warfarin and comes in today for a routine follow up visit.  Positive risk factors for bleeding include an age of 75 years or older.  The bleeding index is 'intermediate risk'.  Positive CHADS2 values include History of CHF, History of HTN, and Age > 33 years old.  The start date was 04/22/2007.  Her last INR was 1.6 RATIO.  Anticoagulation responsible provider: Shirlee Latch MD, Karina Nofsinger.  INR POC: 2.4.  Cuvette Lot#: [id number].  Exp: 10/2010.    Anticoagulation Management Assessment/Plan:      The patient's current anticoagulation dose is Warfarin sodium 6 mg tabs: Take 1 1/2 tablet every day except 1 tablet on Tuesday, Thursday and Saturday or as directed by Anticoagulation Clinic.  The target INR is 2 - 3.  The next INR is due 08/09/2009.  Anticoagulation instructions were given to patient.  Results were reviewed/authorized by Bethena Midget, RN, BSN.  She was notified by Bethena Midget, RN, BSN.         Prior Anticoagulation Instructions: INR 2.1 Continue 9mg s daily except 6mg s on Tuesdays, Thursdays and Saturdays. Recheck in 3 weeks.   Current Anticoagulation Instructions: INR 2.4 Continue 9mg  daily except 6mg  on Tuesdays, Thursdays and Saturdays. Recheck in 4 weeks.

## 2010-04-01 NOTE — Medication Information (Signed)
Summary: rov/tm  Anticoagulant Therapy  Managed by: Bethena Midget, RN, BSN Referring MD: Charlton Haws MD PCP: Newt Lukes MD Supervising MD: Johney Frame MD, Fayrene Fearing Indication 1: Atrial Fibrillation (ICD-427.31) Lab Used: [facility] Site: [facility] INR POC 2.8 INR RANGE 2 - 3  Dietary changes: no    Health status changes: no    Bleeding/hemorrhagic complications: no    Recent/future hospitalizations: no    Any changes in medication regimen? no    Recent/future dental: no  Any missed doses?: no       Is patient compliant with meds? yes       Current Medications (verified): 1)  Vitamin C 500 Mg Tabs (Ascorbic Acid) .Marland Kitchen.. 1 Tab By Mouth Once Daily 2)  Calcium 500 Mg Tabs (Calcium Carbonate) .... 2 Tab By Mouth Once Daily 3)  Cvs Vitamin D 2000 Unit Tabs (Cholecalciferol) .Marland Kitchen.. 1 Tab By Mouth Once Daily 4)  Folic Acid .Marland Kitchen.. 1 Tab By Mouth Once Daily 5)  Digoxin 0.125 Mg Tabs (Digoxin) .Marland Kitchen.. 1 Tab By Mouth Once Daily 6)  Metoprolol Succinate 50 Mg Xr24h-Tab (Metoprolol Succinate) .Marland Kitchen.. 1 and 1/2 Two Times A Day 7)  Warfarin Sodium 6 Mg Tabs (Warfarin Sodium) .... Use As Directed By Anticoagulation Clinic 8)  Oscal 500/200 D-3 500-200 Mg-Unit Tabs (Calcium-Vitamin D) .... 2 Tabs By Mouth Once Daily 9)  Fish Oil   Oil (Fish Oil) .Marland Kitchen.. 1 Tab By Mouth Once Daily 10)  Glucosamine-Chondroitin   Caps (Glucosamine-Chondroit-Vit C-Mn) .Marland Kitchen.. 1 Tab By Mouth Once Daily 11)  Torsemide 20 Mg Tabs (Torsemide) .... Take 1 Tablet Two Times A Day 12)  Potassium Chloride Crys Cr 20 Meq Cr-Tabs (Potassium Chloride Crys Cr) .Marland Kitchen.. 1 Tabs By Mouth Bid  Allergies (verified): 1)  Demerol 2)  Percocet  Anticoagulation Management History:      The patient is taking warfarin and comes in today for a routine follow up visit.  Positive risk factors for bleeding include an age of 9 years or older.  The bleeding index is 'intermediate risk'.  Positive CHADS2 values include History of CHF, History of HTN,  and Age > 5 years old.  The start date was 04/22/2007.  Her last INR was 1.6 RATIO.  Anticoagulation responsible provider: Jahquan Klugh MD, Fayrene Fearing.  INR POC: 2.8.  Cuvette Lot#: [id number].  Exp: 11/2009.    Anticoagulation Management Assessment/Plan:      The patient's current anticoagulation dose is Warfarin sodium 6 mg tabs: Use as directed by Anticoagulation Clinic.  The target INR is 2 - 3.  The next INR is due 11/28/2008.  Anticoagulation instructions were given to patient.  Results were reviewed/authorized by Bethena Midget, RN, BSN.  She was notified by Bethena Midget, RN, BSN.         Prior Anticoagulation Instructions: Continue 9mg s everyday except 6mg s on Tuesdays, Thursdays, and Saturdays  Current Anticoagulation Instructions: INR 2.8 Continue 9mg s daily except on Tuesdays, Thursdays, and Saturdays take 6mg s

## 2010-04-01 NOTE — Medication Information (Signed)
Summary: rov/tm  Anticoagulant Therapy  Managed by: Weston Brass, PharmD Referring MD: Charlton Haws MD PCP: Newt Lukes MD Supervising MD: Gala Romney MD, Reuel Boom Indication 1: Atrial Fibrillation (ICD-427.31) Lab Used: [facility] Site: [facility] INR POC 2.8 INR RANGE 2 - 3  Dietary changes: no    Health status changes: no    Bleeding/hemorrhagic complications: no    Recent/future hospitalizations: no    Any changes in medication regimen? no    Recent/future dental: no  Any missed doses?: no       Is patient compliant with meds? yes       Allergies: 1)  Demerol 2)  Percocet  Anticoagulation Management History:      The patient is taking warfarin and comes in today for a routine follow up visit.  Positive risk factors for bleeding include an age of 30 years or older.  The bleeding index is 'intermediate risk'.  Positive CHADS2 values include History of CHF, History of HTN, and Age > 34 years old.  The start date was 04/22/2007.  Her last INR was 1.6 RATIO.  Anticoagulation responsible provider: Alease Fait MD, Reuel Boom.  INR POC: 2.8.  Cuvette Lot#: [id number].  Exp: 12/2010.    Anticoagulation Management Assessment/Plan:      The patient's current anticoagulation dose is Warfarin sodium 6 mg tabs: Take 1 1/2 tablet every day except 1 tablet on Tuesday, Thursday and Saturday or as directed by Anticoagulation Clinic.  The target INR is 2 - 3.  The next INR is due 10/24/2009.  Anticoagulation instructions were given to patient.  Results were reviewed/authorized by Weston Brass, PharmD.  She was notified by Dillard Cannon.         Prior Anticoagulation Instructions: INR 3.1 Today only take 3mg s then resume 9mg s everyday except 6mg s on Tuesdays, Thursdays and Saturdays. Recheck in 4 weeks.   Current Anticoagulation Instructions: INR 2.8  Continue same dose of 1.5 tabs daily except for 1 tab on Tuesday, Thursday, and Saturday.  Re-check in 4 weeks. Schedule appt with brother  Naevia Unterreiner).

## 2010-04-01 NOTE — Medication Information (Signed)
Summary: ROV/LB  Anticoagulant Therapy  Managed by: Lew Dawes, PharmD Candidate Referring MD: Charlton Haws MD PCP: Newt Lukes MD Supervising MD: Eden Emms MD, Theron Arista Indication 1: Atrial Fibrillation (ICD-427.31) Lab Used: [facility] Site: [facility] INR POC 2.5 INR RANGE 2 - 3  Dietary changes: no    Health status changes: no    Bleeding/hemorrhagic complications: no    Recent/future hospitalizations: no    Any changes in medication regimen? no    Recent/future dental: no  Any missed doses?: no       Is patient compliant with meds? yes       Allergies: 1)  Demerol 2)  Percocet  Anticoagulation Management History:      The patient is taking warfarin and comes in today for a routine follow up visit.  Positive risk factors for bleeding include an age of 23 years or older.  The bleeding index is 'intermediate risk'.  Positive CHADS2 values include History of CHF, History of HTN, and Age > 19 years old.  The start date was 04/22/2007.  Her last INR was 1.6 RATIO.  Anticoagulation responsible provider: Eden Emms MD, Theron Arista.  INR POC: 2.5.  Cuvette Lot#: [id number].  Exp: 06/2010.    Anticoagulation Management Assessment/Plan:      The patient's current anticoagulation dose is Warfarin sodium 6 mg tabs: Use as directed by Anticoagulation Clinic.  The target INR is 2 - 3.  The next INR is due 04/17/2009.  Anticoagulation instructions were given to patient.  Results were reviewed/authorized by Lew Dawes, PharmD Candidate.  She was notified by Lew Dawes, PharmD Candidate.         Prior Anticoagulation Instructions: INR 2.3  CONTINUE TAKING 1.5 TABLETS EVERYDAY EXCEPT TAKE 1 TABLET ON TUESDAYS, THURSDAYS, AND SATURDAYS.  RECHECK IN 3 WEEKS.  Current Anticoagulation Instructions: INR 2.5  Continue same dose of 1.5 tablets daily except 1 tablet on Tuesdays, Thursdays, and Saturdays. Recheck in 4 weeks.

## 2010-04-01 NOTE — Assessment & Plan Note (Signed)
Summary: NEW / MEDICARE/BCBS/ $50 /[place]   Vital Signs:  Patient profile:   77 year old female Height:      68 inches (172.72 cm) Weight:      165.4 pounds (75.18 kg) O2 Sat:      96 % Temp:     98.1 degrees F (36.72 degrees C) oral Pulse rate:   43 / minute BP sitting:   108 / 72  (left arm) Cuff size:   regular  Vitals Entered By: Orlan Leavens (October 25, 2008 9:33 AM) CC: New patient Is Patient Diabetic? No Pain Assessment Patient in pain? no        Primary Care Provider:  Newt Lukes MD  CC:  New patient.  History of Present Illness: here to establish care with local PCP  a fib - chronic coumadin - follws with dr. Eden Emms - leb cards no cp or palp - compliant with all meds  severe arthritis - folows with dr. Turner Daniels - daily severe complaints with R shoulder and L knee uses cane to ambulate - unlikely to go thru other surg due to concerns over her age and heart  hx breast cancer - '00 - s/p R side lumpectomy and XRT(30 tx) at [facility] cancer free per 12/09 check up at [facility]  would also like skin check - lots of "moles" all over body no appreciable change in any particular mole no pain, irriatation or bleeding  Clinical Review Panels:  Prevention   Last Mammogram:  normal (01/06/2008)   Last Pap Smear:  normal (02/10/2008)  Immunizations   Last Tetanus Booster:  Historical (03/02/1996)   Last Pneumovax:  Historical (03/03/2007)  Diabetes Management   Creatinine:  0.9 (12/08/2007)   Last Pneumovax:  Historical (03/03/2007)  Complete Metabolic Panel   Glucose:  99 (12/08/2007)   Sodium:  142 (12/08/2007)   Potassium:  5.0 (12/08/2007)   Chloride:  104 (12/08/2007)   CO2:  33 (12/08/2007)   BUN:  24 (12/08/2007)   Creatinine:  0.9 (12/08/2007)   Calcium:  9.5 (12/08/2007)   Current Medications (verified): 1)  Vitamin C 500 Mg Tabs (Ascorbic Acid) .Marland Kitchen.. 1 Tab By Mouth Once Daily 2)  Calcium 500 Mg Tabs (Calcium Carbonate) .... 2 Tab By Mouth Once  Daily 3)  Cvs Vitamin D 2000 Unit Tabs (Cholecalciferol) .Marland Kitchen.. 1 Tab By Mouth Once Daily 4)  Folic Acid .Marland Kitchen.. 1 Tab By Mouth Once Daily 5)  Digoxin 0.125 Mg Tabs (Digoxin) .Marland Kitchen.. 1 Tab By Mouth Once Daily 6)  Metoprolol Succinate 50 Mg Xr24h-Tab (Metoprolol Succinate) .Marland Kitchen.. 1 and 1/2 Two Times A Day 7)  Coumadin 6 Mg Tabs (Warfarin Sodium) .Marland Kitchen.. 1 Tab Tues, Thur. Sat 1 1/2 The Rest of The Week 8)  Oscal 500/200 D-3 500-200 Mg-Unit Tabs (Calcium-Vitamin D) .... 2 Tabs By Mouth Once Daily 9)  Fish Oil   Oil (Fish Oil) .Marland Kitchen.. 1 Tab By Mouth Once Daily 10)  Glucosamine-Chondroitin   Caps (Glucosamine-Chondroit-Vit C-Mn) .Marland Kitchen.. 1 Tab By Mouth Once Daily 11)  Torsemide 20 Mg Tabs (Torsemide) .... 2 Tabs By Mouth Once Daily 12)  Potassium Chloride Crys Cr 20 Meq Cr-Tabs (Potassium Chloride Crys Cr) .... 2 Tabs By Mouth Once Daily  Allergies (verified): 1)  Demerol 2)  Percocet  Past History:  Past Medical History: HYPERTENSION (ICD-401.9) CARCINOMA, BREAST (ICD-174.9) - dr. Larita Fife scallion [facility] CHEST PAIN (ICD-786.50) UNSPECIFIED DIASTOLIC HEART FAILURE (ICD-428.30) ATRIAL FIBRILLATION (ICD-427.31): chronic EDEMA (ICD-782.3) DEGENERATIVE JOINT DISEASE (ICD-715.90) Diastolic Dysfunction  Orthopedic issues.  Bilatateral hip replacements and right knee replacement - [facility]  Past Surgical History: right knee surgery 2009  both hips replaced by Dr. Turner Daniels - L '06, R '09 lumpectomy '00 remote laminectomy Tonsillectomy  Family History:  Noncontributory  Social History:  She is a nonsmoker  no alcohol  lives with her brother and his wife   Her own home is in [place], [place] Activity limited by arthritis in hips and knees  Review of Systems  The patient denies fever, chest pain, syncope, headaches, and abdominal pain.    Physical Exam  General:  overweight-appearing.  alert, well-developed, well-nourished, and cooperative to examination.    Lungs:  normal respiratory effort, no intercostal  retractions or use of accessory muscles; normal breath sounds bilaterally - no crackles and no wheezes.    Heart:  normal rate and irregular rhythm.  chronic BLE edema distally, slight L>R, 2+ Msk:  mod effusion L knee, chronic, min warmth with careful and limited ROM due to pain - Ambulates indep with use of cane Skin:  numerous moles, AKs on trunk and neck and proximal UE - none with concerning appearance or inflammation/drainage. also distal chronic venous insuff changes in BLE - no cellulitis Psych:  Oriented X3, memory intact for recent and remote, normally interactive, good eye contact, not anxious appearing, not depressed appearing, and not agitated.      Impression & Recommendations:  Problem # 1:  HYPERTENSION (ICD-401.9)  The following medications were removed from the medication list:    Toprol Xl 50 Mg Xr24h-tab (Metoprolol succinate) .Marland Kitchen... Take 1 1/2 tablet by mouth twice a day Her updated medication list for this problem includes:    Metoprolol Succinate 50 Mg Xr24h-tab (Metoprolol succinate) .Marland Kitchen... 1 and 1/2 two times a day    Torsemide 20 Mg Tabs (Torsemide) .Marland Kitchen... 2 tabs by mouth once daily  BP today: 108/72 Prior BP: 128/76 (08/06/2008)  Labs Reviewed: K+: 5.0 (12/08/2007) Creat: : 0.9 (12/08/2007)     Orders: TLB-BMP (Basic Metabolic Panel-BMET) (80048-METABOL) TLB-Lipid Panel (80061-LIPID)  Problem # 2:  EDEMA (ICD-782.3)  Her updated medication list for this problem includes:    Torsemide 20 Mg Tabs (Torsemide) .Marland Kitchen... 2 tabs by mouth once daily  Orders: TLB-CBC Platelet - w/Differential (85025-CBCD) TLB-TSH (Thyroid Stimulating Hormone) (84443-TSH)  Discussed elevation of the legs, use of compression stockings, sodium restiction, and medication use.   Problem # 3:  ATRIAL FIBRILLATION (ICD-427.31) follows with cards and anticoag clinic The following medications were removed from the medication list:    Toprol Xl 50 Mg Xr24h-tab (Metoprolol succinate) .Marland Kitchen...  Take 1 1/2 tablet by mouth twice a day    Warfarin Sodium 6 Mg Tabs (Warfarin sodium) .Marland Kitchen... Take as directed by coumadin clinic. Her updated medication list for this problem includes:    Digoxin 0.125 Mg Tabs (Digoxin) .Marland Kitchen... 1 tab by mouth once daily    Metoprolol Succinate 50 Mg Xr24h-tab (Metoprolol succinate) .Marland Kitchen... 1 and 1/2 two times a day    Coumadin 6 Mg Tabs (Warfarin sodium) .Marland Kitchen... 1 tab tues, thur. sat 1 1/2 the rest of the week  Reviewed the following: PT: 18.5 (10/03/2008)   INR: 1.6 RATIO (10/21/2007) Coumadin Dose (weekly): 54 mg (10/03/2008) Prior Coumadin Dose (weekly): 54 mg (10/03/2008) Next Protime: 10/31/2008 (dated on 10/03/2008)  Problem # 4:  CARCINOMA, BREAST (ICD-174.9) in remission - follows at [facility] annually  Complete Medication List: 1)  Vitamin C 500 Mg Tabs (Ascorbic acid) .Marland Kitchen.. 1 tab by mouth once daily 2)  Calcium 500 Mg Tabs (Calcium carbonate) .... 2 tab by mouth once daily 3)  Cvs Vitamin D 2000 Unit Tabs (Cholecalciferol) .Marland Kitchen.. 1 tab by mouth once daily 4)  Folic Acid  .Marland Kitchen.. 1 tab by mouth once daily 5)  Digoxin 0.125 Mg Tabs (Digoxin) .Marland Kitchen.. 1 tab by mouth once daily 6)  Metoprolol Succinate 50 Mg Xr24h-tab (Metoprolol succinate) .Marland Kitchen.. 1 and 1/2 two times a day 7)  Coumadin 6 Mg Tabs (Warfarin sodium) .Marland Kitchen.. 1 tab tues, thur. sat 1 1/2 the rest of the week 8)  Oscal 500/200 D-3 500-200 Mg-unit Tabs (Calcium-vitamin d) .... 2 tabs by mouth once daily 9)  Fish Oil Oil (Fish oil) .Marland Kitchen.. 1 tab by mouth once daily 10)  Glucosamine-chondroitin Caps (Glucosamine-chondroit-vit c-mn) .Marland Kitchen.. 1 tab by mouth once daily 11)  Torsemide 20 Mg Tabs (Torsemide) .... 2 tabs by mouth once daily 12)  Potassium Chloride Crys Cr 20 Meq Cr-tabs (Potassium chloride crys cr) .... 2 tabs by mouth once daily  Patient Instructions: 1)  it was a pleasure meeting you today 2)  we will check labwork today and you will be mailed a copy of these results 3)  continue all medications as currently  ongoing without change at this time - if your labs suggest a need for change, we will contact you about this 4)  Please schedule a follow-up appointment in 3-6 months, sooner if any problems.   Immunization History:  Tetanus/Td Immunization History:    Tetanus/Td:  historical (03/02/1996)  Pneumovax Immunization History:    Pneumovax:  historical (03/03/2007)   Preventive Care Screening  Pap Smear:    Date:  02/10/2008    Results:  normal   Mammogram:    Date:  01/06/2008    Results:  normal   Last Pneumovax:    Date:  03/03/2007    Results:  Historical   Last Tetanus Booster:    Date:  03/02/1996    Results:  Historical

## 2010-04-01 NOTE — Progress Notes (Signed)
  Phone Note From Other Clinic   Caller: [facility] Summary of Call: Dr. Leticia Penna at [facility] needs to speak to Dr. Felicity Coyer concerning the pt. [name] 11-02-1922. Heid at [facility]  called with this msg. the call back number is [phone]. Initial call taken by: Scharlene Gloss,  August 08, 2009 11:38 AM  Follow-up for Phone Call        spoke with Dr Leticia Penna at [phone] regarding this pt;  has ulcer on second toe. pt declines surgury, difficult  situation, pt not interested in aggressive therapy;  higher risk for osteo Dr Caesar Bookman asks for help with seeing if pt will accept an ID consult I stated I will ask pt to return for OV to discuss, and hopefully pt will accept further eval per ID Robin - to call pt - ? ask pt to see Dr Felicity Coyer on Mon june 13? ( or soon thereafter)  Follow-up by: Corwin Levins MD,  August 09, 2009 1:15 PM  Additional Follow-up for Phone Call Additional follow up Details #1::        called pt could not leave msg, will try back Additional Follow-up by: Scharlene Gloss,  August 09, 2009 1:34 PM    Additional Follow-up for Phone Call Additional follow up Details #2::    called pt and she agreed to schedule an OV with Dr. Felicity Coyer at her convenience to further evalute. Follow-up by: Scharlene Gloss,  August 12, 2009 8:54 AM

## 2010-04-01 NOTE — Medication Information (Signed)
Summary: rov/jk  Anticoagulant Therapy  Managed by: Weston Brass, PharmD Referring MD: Charlton Haws MD PCP: Newt Lukes MD Supervising MD: Juanda Chance MD, Vania Rosero Indication 1: Atrial Fibrillation (ICD-427.31) Lab Used: [facility]  Site: [facility] INR POC 2.2 INR RANGE 2 - 3  Dietary changes: no    Health status changes: no    Bleeding/hemorrhagic complications: yes       Details: Larey Seat and has big bruise on side of hip and leg  Recent/future hospitalizations: no    Any changes in medication regimen? no    Recent/future dental: no  Any missed doses?: no       Is patient compliant with meds? yes       Allergies: 1)  Demerol 2)  Percocet  Anticoagulation Management History:      The patient is taking warfarin and comes in today for a routine follow up visit.  Positive risk factors for bleeding include an age of 75 years or older.  The bleeding index is 'intermediate risk'.  Positive CHADS2 values include History of CHF, History of HTN, and Age > 75 years old.  The start date was 04/22/2007.  Her last INR was 1.6 RATIO.  Anticoagulation responsible provider: Juanda Chance MD, Smitty Cords.  INR POC: 2.2.  Cuvette Lot#: [id number].  Exp: 01/2011.    Anticoagulation Management Assessment/Plan:      The patient's current anticoagulation dose is Warfarin sodium 6 mg tabs: Take 1 1/2 tablet every day except 1 tablet on Tuesday, Thursday and Saturday or as directed by Anticoagulation Clinic.  The target INR is 2 - 3.  The next INR is due 12/18/2009.  Anticoagulation instructions were given to patient.  Results were reviewed/authorized by Weston Brass, PharmD.         Prior Anticoagulation Instructions: INR 1.9  Tonight, take 1.5 tablets (9mg ).  Then resume normal schedule of taking 1.5 tablets (9mg ) every day except take 1 tablet (6mg ) on Tuesday and Thursday.  Recheck in 4 weeks.   Current Anticoagulation Instructions: INR 2.2  Continue taking 1 1/2 tablets everyday except take 1 tablet on  Tuesdays, Thursdays, and Saturdays. Re-check INR in 4 weeks.

## 2010-04-01 NOTE — Miscellaneous (Signed)
Summary: appdate med  Clinical Lists Changes  Medications: Added new medication of DEMADEX 20 MG TABS (TORSEMIDE) Take 1 tablet by mouth two times a day

## 2010-04-01 NOTE — Medication Information (Signed)
Summary: rov.mp  Anticoagulant Therapy  Managed by: Cloyde Reams, RN, BSN Referring MD: Charlton Haws MD PCP: Newt Lukes MD Supervising MD: Graciela Husbands MD, Viviann Spare Indication 1: Atrial Fibrillation (ICD-427.31) Lab Used: [facility] Site: [facility] INR POC 3.2 INR RANGE 2 - 3  Dietary changes: no    Health status changes: no    Bleeding/hemorrhagic complications: no    Recent/future hospitalizations: no    Any changes in medication regimen? no    Recent/future dental: no  Any missed doses?: no       Is patient compliant with meds? yes       Allergies (verified): 1)  Demerol 2)  Percocet  Anticoagulation Management History:      The patient is taking warfarin and comes in today for a routine follow up visit.  Positive risk factors for bleeding include an age of 75 years or older.  The bleeding index is 'intermediate risk'.  Positive CHADS2 values include History of CHF, History of HTN, and Age > 55 years old.  The start date was 04/22/2007.  Her last INR was 1.6 RATIO.  Anticoagulation responsible provider: Graciela Husbands MD, Viviann Spare.  INR POC: 3.2.  Cuvette Lot#: [id number].  Exp: 12/2009.    Anticoagulation Management Assessment/Plan:      The patient's current anticoagulation dose is Warfarin sodium 6 mg tabs: Use as directed by Anticoagulation Clinic.  The target INR is 2 - 3.  The next INR is due 12/19/2008.  Anticoagulation instructions were given to patient.  Results were reviewed/authorized by Cloyde Reams, RN, BSN.  She was notified by Massie Bougie, PharmD Cand..         Prior Anticoagulation Instructions: INR 2.8 Continue 9mg s daily except on Tuesdays, Thursdays, and Saturdays take 6mg s  Current Anticoagulation Instructions: INR 3.2  Skip today's dose, then resume regimen of 1 1/2 tablet every day except 1 tablet on Tuesdays, Thursdays and Saturdays.

## 2010-04-01 NOTE — Assessment & Plan Note (Signed)
Summary: rov/ gd   Primary Provider:  Newt Lukes MD  CC:  check up.  History of Present Illness: [name] is seen today in followup for her chronic atrial fibrillation, anticoagulation, lower extremity edema.  She is from [place]  but has been with her brother here in [place] for most of the past year.  she needs a primary care physician.  Not in any significant palpitations.  She is still a good candidate for Coumadin despite her age.  Her INRs have been therapeutic in our Coumadin clinic.  She had multiple orthopedic surgeries last year.  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  Her left knee seems to be improving and she does not think she'll need surgery for it.  She has a brace for her.  She's had a distant history of diastolic dysfunction which has been quiet and.  She has lower extremity edema which is improved on her Demadex which he takes a total of 40 mg a day.  Current Problems (verified): 1)  Coumadin Therapy  (ICD-V58.61) 2)  Hypertension  (ICD-401.9) 3)  Carcinoma, Breast  (ICD-174.9) 4)  Chest Pain  (ICD-786.50) 5)  Unspecified Diastolic Heart Failure  (ICD-428.30) 6)  Atrial Fibrillation  (ICD-427.31) 7)  Edema  (ICD-782.3) 8)  Degenerative Joint Disease  (ICD-715.90)  Current Medications (verified): 1)  Vitamin C 500 Mg Tabs (Ascorbic Acid) .Marland Kitchen.. 1 Tab By Mouth Once Daily 2)  Calcium 500 Mg Tabs (Calcium Carbonate) .... 2 Tab By Mouth Once Daily 3)  Cvs Vitamin D 2000 Unit Tabs (Cholecalciferol) .Marland Kitchen.. 1 Tab By Mouth Once Daily 4)  Digoxin 0.125 Mg Tabs (Digoxin) .Marland Kitchen.. 1 Tab By Mouth Once Daily 5)  Metoprolol Succinate 50 Mg Xr24h-Tab (Metoprolol Succinate) .Marland Kitchen.. 1 and 1/2 Two Times A Day 6)  Warfarin Sodium 6 Mg Tabs (Warfarin Sodium) .... Take 1 1/2 Tablet Every Day Except 1 Tablet On Tuesday, Thursday and Saturday or As Directed By Anticoagulation Clinic 7)  Oscal 500/200 D-3 500-200 Mg-Unit Tabs (Calcium-Vitamin D) .... 2 Tabs By Mouth Once  Daily 8)  Fish Oil   Oil (Fish Oil) .Marland Kitchen.. 1 Tab By Mouth Once Daily 9)  Glucosamine-Chondroitin   Caps (Glucosamine-Chondroit-Vit C-Mn) .Marland Kitchen.. 1 Tab By Mouth Once Daily 10)  Torsemide 20 Mg Tabs (Torsemide) .... Take 1 Tablet Two Times A Day 11)  Potassium Chloride Crys Cr 20 Meq Cr-Tabs (Potassium Chloride Crys Cr) .Marland Kitchen.. 1 Tabs By Mouth Bid  Allergies (verified): 1)  Demerol 2)  Percocet  Past History:  Past Medical History: Last updated: 10/25/2008 HYPERTENSION (ICD-401.9) CARCINOMA, BREAST (ICD-174.9) - dr. Larita Fife scallion [facility] CHEST PAIN (ICD-786.50) UNSPECIFIED DIASTOLIC HEART FAILURE (ICD-428.30) ATRIAL FIBRILLATION (ICD-427.31): chronic EDEMA (ICD-782.3) DEGENERATIVE JOINT DISEASE (ICD-715.90) Diastolic Dysfunction  Orthopedic issues. Bilatateral hip replacements and right knee replacement - [facility]  Past Surgical History: Last updated: 10/25/2008 right knee surgery 2009  both hips replaced by Dr. Turner Daniels - L '06, R '09 lumpectomy '00 remote laminectomy Tonsillectomy  Family History: Last updated: 10/25/2008  Noncontributory  Social History: Last updated: 10/25/2008  She is a nonsmoker  no alcohol  lives with her brother and his wife   Her own home is in [place], [place] Activity limited by arthritis in hips and knees  Review of Systems       Denies fever, malais, weight loss, blurry vision, decreased visual acuity, cough, sputum, SOB, hemoptysis, pleuritic pain, palpitaitons, heartburn, abdominal pain, melena,  claudication, or rash.   Vital Signs:  Patient profile:  77 year old female Height:      68 inches Weight:      171 pounds BMI:     26.09 Pulse rate:   52 / minute Pulse rhythm:   irregularly irregular Resp:     12 per minute BP sitting:   130 / 70  (left arm)  Vitals Entered By: Kem Parkinson (September 26, 2009 11:16 AM)  Physical Exam  General:  Affect appropriate Healthy:  appears stated age HEENT: normal Neck supple with no adenopathy JVP  normal no bruits no thyromegaly Lungs clear with no wheezing and good diaphragmatic motion Heart:  S1/S2 no murmur,rub, gallop or click PMI normal Abdomen: benighn, BS positve, no tenderness, no AAA no bruit.  No HSM or HJR Distal pulses intact with no bruits Plus 3 bilateral  edema Neuro non-focal Skin warm and dry    Impression & Recommendations:  Problem # 1:  COUMADIN THERAPY (ICD-V58.61) F/U [facility] refill called in no falls and able to ambulate with cane  Problem # 2:  HYPERTENSION (ICD-401.9) Stable continue currient meds Her updated medication list for this problem includes:    Metoprolol Succinate 50 Mg Xr24h-tab (Metoprolol succinate) .Marland Kitchen... 1 and 1/2 two times a day    Torsemide 20 Mg Tabs (Torsemide) .Marland Kitchen... Take 1 tablet two times a day  Problem # 3:  ATRIAL FIBRILLATION (ICD-427.31) Rate is a little slow. D/C Digoxen no presyncope The following medications were removed from the medication list:    Digoxin 0.125 Mg Tabs (Digoxin) .Marland Kitchen... 1 tab by mouth once daily Her updated medication list for this problem includes:    Metoprolol Succinate 50 Mg Xr24h-tab (Metoprolol succinate) .Marland Kitchen... 1 and 1/2 two times a day    Warfarin Sodium 6 Mg Tabs (Warfarin sodium) .Marland Kitchen... Take 1 1/2 tablet every day except 1 tablet on tuesday, thursday and saturday or as directed by anticoagulation clinic  Problem # 4:  EDEMA (ICD-782.3) Stressed compliance of two times a day diuretic  Patient Instructions: 1)  Your physician recommends that you schedule a follow-up appointment in: 6 MONTHS 2)  Your physician has recommended you make the following change in your medication: STOP DIGOXIN/LANOXIN Prescriptions: WARFARIN SODIUM 6 MG TABS (WARFARIN SODIUM) Take 1 1/2 tablet every day except 1 tablet on Tuesday, Thursday and Saturday or as directed by Anticoagulation Clinic  #40 x 3   Entered by:   Deliah Goody, RN   Authorized by:   Colon Branch, MD, [facility]   Signed by:   Deliah Goody, RN on  09/26/2009   Method used:   Electronically to        [id number]* (retail)       [address]       [place], [place]  [address]       Ph: [phone]       Fax: [phone]   RxID:   [id number]    EKG Report  Procedure date:  09/26/2009  Findings:      Afib 57 Nonspecific ST/T wave changes

## 2010-04-01 NOTE — Miscellaneous (Signed)
Summary: [facility]  [facility]   Imported By: Roderic Ovens 02/21/2009 14:28:15  _____________________________________________________________________  External Attachment:    Type:   Image     Comment:   External Document

## 2010-04-01 NOTE — Medication Information (Signed)
Summary: rov/tm  Anticoagulant Therapy  Managed by: Bethena Midget, RN, BSN Referring MD: Charlton Haws MD PCP: Newt Lukes MD Supervising MD: Johney Frame MD, Fayrene Fearing Indication 1: Atrial Fibrillation (ICD-427.31) Lab Used: [facility] Site: [facility] INR POC 3.1 INR RANGE 2 - 3  Dietary changes: no    Health status changes: no    Bleeding/hemorrhagic complications: no    Recent/future hospitalizations: no    Any changes in medication regimen? no    Recent/future dental: no  Any missed doses?: no       Is patient compliant with meds? yes      Comments: Has had family crisis, thus diet not usual.   Allergies: 1)  Demerol 2)  Percocet  Anticoagulation Management History:      The patient is taking warfarin and comes in today for a routine follow up visit.  Positive risk factors for bleeding include an age of 75 years or older.  The bleeding index is 'intermediate risk'.  Positive CHADS2 values include History of CHF, History of HTN, and Age > 75 years old.  The start date was 04/22/2007.  Her last INR was 1.6 RATIO.  Anticoagulation responsible provider: Zaahir Pickney MD, Fayrene Fearing.  INR POC: 3.1.  Cuvette Lot#: [id number].  Exp: 07/2010.    Anticoagulation Management Assessment/Plan:      The patient's current anticoagulation dose is Warfarin sodium 6 mg tabs: Use as directed by Anticoagulation Clinic.  The target INR is 2 - 3.  The next INR is due 06/19/2009.  Anticoagulation instructions were given to patient.  Results were reviewed/authorized by Bethena Midget, RN, BSN.  She was notified by Bethena Midget, RN, BSN.         Prior Anticoagulation Instructions: INR 2.5 Continue 9mg s daily except 6mg s on Tuesdays, Thursdays and Saturdays. Recheck in 4 weeks.   Current Anticoagulation Instructions: INR 3.1 Today take only 3mg s then resume 9mg s daily except 6mg s on Tuesdays,  Thursdays and Saturdays. Recheck in 4 weeks.

## 2010-04-01 NOTE — Assessment & Plan Note (Signed)
Summary: 6 mo f/u//cy  Medications Added VITAMIN C 500 MG TABS (ASCORBIC ACID) 1 tab by mouth once daily CALCIUM 500 MG TABS (CALCIUM CARBONATE) 2 tab by mouth once daily [facility] VITAMIN D 2000 UNIT TABS (CHOLECALCIFEROL) 1 tab by mouth once daily * FOLIC ACID 1 tab by mouth once daily DIGOXIN 0.125 MG TABS (DIGOXIN) 1 tab by mouth once daily METOPROLOL SUCCINATE 50 MG XR24H-TAB (METOPROLOL SUCCINATE) 1 and 1/2 two times a day COUMADIN 6 MG TABS (WARFARIN SODIUM) 1 tab tues, thur. sat 1 1/2 the rest of the week OSCAL 500/200 D-3 500-200 MG-UNIT TABS (CALCIUM-VITAMIN D) 2 tabs by mouth once daily FISH OIL   OIL (FISH OIL) 1 tab by mouth once daily GLUCOSAMINE-CHONDROITIN   CAPS (GLUCOSAMINE-CHONDROIT-VIT C-MN) 1 tab by mouth once daily TORSEMIDE 20 MG TABS (TORSEMIDE) 2 tabs by mouth once daily POTASSIUM CHLORIDE CRYS CR 20 MEQ CR-TABS (POTASSIUM CHLORIDE CRYS CR) 2 tabs by mouth once daily KLOR-CON M20 20 MEQ CR-TABS (POTASSIUM CHLORIDE CRYS CR) 1 tablet by mouth twice a day TOPROL XL 50 MG XR24H-TAB (METOPROLOL SUCCINATE) Take 1 1/2 tablet by mouth twice a day WARFARIN SODIUM 6 MG TABS (WARFARIN SODIUM) Take as directed      Allergies Added: NKDA  CC:  6 month follow up.  History of Present Illness: [name] is seen today in followup for her chronic atrial fibrillation, anticoagulation, lower extremity edema.  She is from [place]  but has been with her brother here in [place] for most of the past year.  she needs a primary care physician.  Not in any significant palpitations.  She is still a good candidate for Coumadin despite her age.  Her INRs have been therapeutic in our Coumadin clinic.  She had multiple orthopedic surgeries last year.  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  Her left knee seems to be improving and she does not think she'll need surgery for it.  She has a brace for her.  She's had a distant history of diastolic dysfunction which has been quiet and.  She  has lower extremity edema which is improved on her Demadex which he takes a total of 40 mg a day.  Current Problems (verified): 1)  Hypertension  (ICD-401.9) 2)  Carcinoma, Breast  (ICD-174.9) 3)  Chest Pain  (ICD-786.50) 4)  Unspecified Diastolic Heart Failure  (ICD-428.30) 5)  Atrial Fibrillation  (ICD-427.31) 6)  Edema  (ICD-782.3) 7)  Degenerative Joint Disease  (ICD-715.90)  Current Medications (verified): 1)  Vitamin C 500 Mg Tabs (Ascorbic Acid) .Marland Kitchen.. 1 Tab By Mouth Once Daily 2)  Calcium 500 Mg Tabs (Calcium Carbonate) .... 2 Tab By Mouth Once Daily 3)  Cvs Vitamin D 2000 Unit Tabs (Cholecalciferol) .Marland Kitchen.. 1 Tab By Mouth Once Daily 4)  Folic Acid .Marland Kitchen.. 1 Tab By Mouth Once Daily 5)  Digoxin 0.125 Mg Tabs (Digoxin) .Marland Kitchen.. 1 Tab By Mouth Once Daily 6)  Metoprolol Succinate 50 Mg Xr24h-Tab (Metoprolol Succinate) .Marland Kitchen.. 1 and 1/2 Two Times A Day 7)  Coumadin 6 Mg Tabs (Warfarin Sodium) .Marland Kitchen.. 1 Tab Tues, Thur. Sat 1 1/2 The Rest of The Week 8)  Oscal 500/200 D-3 500-200 Mg-Unit Tabs (Calcium-Vitamin D) .... 2 Tabs By Mouth Once Daily 9)  Fish Oil   Oil (Fish Oil) .Marland Kitchen.. 1 Tab By Mouth Once Daily 10)  Glucosamine-Chondroitin   Caps (Glucosamine-Chondroit-Vit C-Mn) .Marland Kitchen.. 1 Tab By Mouth Once Daily 11)  Torsemide 20 Mg Tabs (Torsemide) .... 2 Tabs By Mouth Once  Daily 12)  Potassium Chloride Crys Cr 20 Meq Cr-Tabs (Potassium Chloride Crys Cr) .... 2 Tabs By Mouth Once Daily 13)  Klor-Con M20 20 Meq Cr-Tabs (Potassium Chloride Crys Cr) .Marland Kitchen.. 1 Tablet By Mouth Twice A Day 14)  Toprol Xl 50 Mg Xr24h-Tab (Metoprolol Succinate) .... Take 1 1/2 Tablet By Mouth Twice A Day 15)  Warfarin Sodium 6 Mg Tabs (Warfarin Sodium) .... Take As Directed  Allergies (verified): No Known Drug Allergies  Past History:  Past Medical History: Last updated: 08/03/2008 Current Problems:  HYPERTENSION (ICD-401.9) CARCINOMA, BREAST (ICD-174.9) CHEST PAIN (ICD-786.50) UNSPECIFIED DIASTOLIC HEART FAILURE  (ICD-428.30) ATRIAL FIBRILLATION (ICD-427.31): chronic EDEMA (ICD-782.3) DEGENERATIVE JOINT DISEASE (ICD-715.90) Diastolic Dysfunction  Orthopedic issues. Bilatateral hip replacements and right knee replacement  Past Surgical History: Last updated: 08/03/2008 right knee surgery.  both hips  replaced by Dr. Turner Daniels. lumpectomy  She has chronic back problems with a remote laminectomy.      Family History: Last updated: 08/03/2008   Noncontributory.   Social History: Last updated: 08/06/2008  She is a nonsmoker and does not drink alcohol.  She   lives with her brother.  Her own home is in [place] Activity limited by arthritis in hips and knees  Social History:  She is a nonsmoker and does not drink alcohol.  She   lives with her brother.  Her own home is in [place] Activity limited by arthritis in hips and knees  Vital Signs:  Patient profile:   77 year old female Height:      68 inches Weight:      167 pounds Pulse rate:   60 / minute BP sitting:   128 / 76  (left arm)  Vitals Entered By: Kem Parkinson (August 06, 2008 4:22 PM)  Physical Exam  General:  Affect appropriate Healthy:  appears stated age HEENT: normal Neck supple with no adenopathy JVP normal no bruits no thyromegaly Lungs clear with no wheezing and good diaphragmatic motion Heart:  S1/S2 no murmur,rub, gallop or click PMI normal Abdomen: benighn, BS positve, no tenderness, no AAA no bruit.  No HSM or HJR Distal pulses intact with no bruits Plaus 2 edema bilatterally Neuro non-focal Skin warm and dry S/P bilateral hip repladement and right knee  replacement    Impression & Recommendations:  Problem # 1:  HYPERTENSION (ICD-401.9) well controlled The following medications were removed from the medication list:    Metoprolol Succinate 50 Mg Xr24h-tab (Metoprolol succinate) .Marland Kitchen... 1 1/2 tab by mouth two times a day    Demadex 20 Mg Tabs (Torsemide) .Marland Kitchen... Take 1 tablet by mouth two times a  day Her updated medication list for this problem includes:    Metoprolol Succinate 50 Mg Xr24h-tab (Metoprolol succinate) .Marland Kitchen... 1 and 1/2 two times a day    Torsemide 20 Mg Tabs (Torsemide) .Marland Kitchen... 2 tabs by mouth once daily    Toprol Xl 50 Mg Xr24h-tab (Metoprolol succinate) .Marland Kitchen... Take 1 1/2 tablet by mouth twice a day  Problem # 2:  ATRIAL FIBRILLATION (ICD-427.31) Chronic good rate control.   The following medications were removed from the medication list:    Digoxin 0.25 Mg Tabs (Digoxin) .Marland Kitchen... 1 tab by mouth once daily    Metoprolol Succinate 50 Mg Xr24h-tab (Metoprolol succinate) .Marland Kitchen... 1 1/2 tab by mouth two times a day Her updated medication list for this problem includes:    Digoxin 0.125 Mg Tabs (Digoxin) .Marland Kitchen... 1 tab by mouth once daily    Metoprolol Succinate 50 Mg  Xr24h-tab (Metoprolol succinate) .Marland Kitchen... 1 and 1/2 two times a day    Coumadin 6 Mg Tabs (Warfarin sodium) .Marland Kitchen... 1 tab tues, thur. sat 1 1/2 the rest of the week    Toprol Xl 50 Mg Xr24h-tab (Metoprolol succinate) .Marland Kitchen... Take 1 1/2 tablet by mouth twice a day    Warfarin Sodium 6 Mg Tabs (Warfarin sodium) .Marland Kitchen... Take as directed  Problem # 3:  EDEMA (ICD-782.3) Improved on Demedex  Problem # 4:  DEGENERATIVE JOINT DISEASE (ICD-715.90) F/U Dr. Turner Daniels  left knee stable for now  Problem # 5:  COUMADIN THERAPY (ICD-V58.61) No bleeding diathesis, not falling ambulates with cane allright.  F/U clinic  Patient Instructions: 1)  Your physician recommends that you schedule a follow-up appointment in:  6 months 2)  You have been referred to dr Felicity Coyer

## 2010-04-01 NOTE — Medication Information (Signed)
Summary: rov/sel  Anticoagulant Therapy  Managed by: Darrick Penna Referring MD: Charlton Haws MD PCP: Newt Lukes MD Supervising MD: Myrtis Ser MD, Tinnie Gens Indication 1: Atrial Fibrillation (ICD-427.31) Lab Used: [facility] Site: [facility] INR POC 1.7 INR RANGE 2 - 3  Dietary changes: yes       Details: Increase green vegetables since last visit, turnip and collards  Health status changes: no    Bleeding/hemorrhagic complications: no    Recent/future hospitalizations: no    Any changes in medication regimen? no    Recent/future dental: no  Any missed doses?: no       Is patient compliant with meds? yes       Allergies: 1)  Demerol 2)  Percocet  Anticoagulation Management History:      The patient is taking warfarin and comes in today for a routine follow up visit.  Positive risk factors for bleeding include an age of 71 years or older.  The bleeding index is 'intermediate risk'.  Positive CHADS2 values include History of CHF, History of HTN, and Age > 17 years old.  The start date was 04/22/2007.  Her last INR was 1.6 RATIO and today's INR is 1.7.  Anticoagulation responsible provider: Myrtis Ser MD, Tinnie Gens.  INR POC: 1.7.  Cuvette Lot#: [id number].  Exp: 01/2011.    Anticoagulation Management Assessment/Plan:      The patient's current anticoagulation dose is Warfarin sodium 6 mg tabs: Take 1 1/2 tablet every day except 1 tablet on Tuesday, Thursday and Saturday or as directed by Anticoagulation Clinic.  The target INR is 2 - 3.  The next INR is due 01/29/2010.  Anticoagulation instructions were given to patient.  Results were reviewed/authorized by Darrick Penna.  She was notified by Cathlyn Parsons RN.         Prior Anticoagulation Instructions: INR 2.5  Continue taking 1 1/2 tablets everyday except 1 tablet on Tuesday, Thursday, and Saturday. Recheck in 5 weeks. (pt's brother requested this and she gets a ride with him)   Current Anticoagulation  Instructions: INR 1.7 Patient to increase today dose to 2tabs and tomorrow 1.5 tabs and return to dose of 1.5 Sun, Mon , Wed and Frid and 1tab on Tue and Thur. Recheck in 10-14 days. Continue eating green leafy vegetables as normal each week.

## 2010-04-01 NOTE — Medication Information (Signed)
Summary: rov/tm  Anticoagulant Therapy  Managed by: Jeralene Peters, PharmD Referring MD: Charlton Haws MD PCP: Newt Lukes MD Supervising MD: Riley Kill MD, Maisie Fus Indication 1: Atrial Fibrillation (ICD-427.31) Lab Used: [facility] Site: [facility] INR POC 2.3 INR RANGE 2 - 3  Dietary changes: no    Health status changes: no    Bleeding/hemorrhagic complications: no    Recent/future hospitalizations: no    Any changes in medication regimen? no    Recent/future dental: no  Any missed doses?: no       Is patient compliant with meds? yes       Current Medications (verified): 1)  Vitamin C 500 Mg Tabs (Ascorbic Acid) .Marland Kitchen.. 1 Tab By Mouth Once Daily 2)  Calcium 500 Mg Tabs (Calcium Carbonate) .... 2 Tab By Mouth Once Daily 3)  Cvs Vitamin D 2000 Unit Tabs (Cholecalciferol) .Marland Kitchen.. 1 Tab By Mouth Once Daily 4)  Folic Acid .Marland Kitchen.. 1 Tab By Mouth Once Daily 5)  Digoxin 0.125 Mg Tabs (Digoxin) .Marland Kitchen.. 1 Tab By Mouth Once Daily 6)  Metoprolol Succinate 50 Mg Xr24h-Tab (Metoprolol Succinate) .Marland Kitchen.. 1 and 1/2 Two Times A Day 7)  Warfarin Sodium 6 Mg Tabs (Warfarin Sodium) .... Use As Directed By Anticoagulation Clinic 8)  Oscal 500/200 D-3 500-200 Mg-Unit Tabs (Calcium-Vitamin D) .... 2 Tabs By Mouth Once Daily 9)  Fish Oil   Oil (Fish Oil) .Marland Kitchen.. 1 Tab By Mouth Once Daily 10)  Glucosamine-Chondroitin   Caps (Glucosamine-Chondroit-Vit C-Mn) .Marland Kitchen.. 1 Tab By Mouth Once Daily 11)  Torsemide 20 Mg Tabs (Torsemide) .... Take 1 Tablet Two Times A Day 12)  Potassium Chloride Crys Cr 20 Meq Cr-Tabs (Potassium Chloride Crys Cr) .Marland Kitchen.. 1 Tabs By Mouth Bid  Allergies (verified): 1)  Demerol 2)  Percocet  Anticoagulation Management History:      Positive risk factors for bleeding include an age of 28 years or older.  The bleeding index is 'intermediate risk'.  Positive CHADS2 values include History of CHF, History of HTN, and Age > 71 years old.  The start date was 04/22/2007.  Her last INR was 1.6 RATIO.   Anticoagulation responsible provider: Riley Kill MD, Maisie Fus.  INR POC: 2.3.  Exp: 04/2010.    Anticoagulation Management Assessment/Plan:      The patient's current anticoagulation dose is Warfarin sodium 6 mg tabs: Use as directed by Anticoagulation Clinic.  The target INR is 2 - 3.  The next INR is due 03/20/2009.  Anticoagulation instructions were given to patient.  Results were reviewed/authorized by Jeralene Peters, PharmD.         Prior Anticoagulation Instructions: INR 3.6 Skip todays dose, then resume 9mg s daily except 6mg s on Tuesdays, Thursdays and Saturdays. Recheck in 2 weeks.   Current Anticoagulation Instructions: INR 2.3  CONTINUE TAKING 1.5 TABLETS EVERYDAY EXCEPT TAKE 1 TABLET ON TUESDAYS, THURSDAYS, AND SATURDAYS.  RECHECK IN 3 WEEKS.

## 2010-04-01 NOTE — Medication Information (Signed)
Summary: rov/ewj  Anticoagulant Therapy  Managed by: Bethena Midget, RN, BSN Referring MD: Charlton Haws MD Supervising MD: Daleen Squibb MD, Maisie Fus Indication 1: Atrial Fibrillation (ICD-427.31) Lab Used: [facility] Site: [facility] PT 18.5 INR POC 2.3 INR RANGE 2 - 3  Dietary changes: no    Health status changes: no    Bleeding/hemorrhagic complications: no    Recent/future hospitalizations: no    Any changes in medication regimen? no    Recent/future dental: no  Any missed doses?: no       Is patient compliant with meds? yes       Allergies: 1)  Demerol 2)  Percocet  Anticoagulation Management History:      The patient is taking warfarin and comes in today for a routine follow up visit.  Positive risk factors for bleeding include an age of 75 years or older.  The bleeding index is 'intermediate risk'.  Positive CHADS2 values include History of CHF, History of HTN, and Age > 76 years old.  The start date was 04/22/2007.  Her last INR was 1.6 RATIO.  Prothrombin time is 18.5.  Anticoagulation responsible provider: Daleen Squibb MD, Maisie Fus.  INR POC: 2.3.  Cuvette Lot#: [id number].  Exp: 07/11.    Anticoagulation Management Assessment/Plan:      The patient's current anticoagulation dose is Coumadin 6 mg tabs: 1 tab tues, thur. sat 1 1/2 the rest of the week, Warfarin sodium 6 mg tabs: Take as directed by coumadin clinic..  The target INR is 2 - 3.  The next INR is due 10/31/2008.  Anticoagulation instructions were given to patient.  Results were reviewed/authorized by Bethena Midget, RN, BSN.  She was notified by Bethena Midget, RN, BSN.         Prior Anticoagulation Instructions: INR 1.8  Take 2 tablets today then resume 1 and 1/2 tablets daily except 1 tablet on Tuesdays, Thursdays and Saturdays.  Recheck in 2 weeks.  Current Anticoagulation Instructions: Continue 9mg s everyday except 6mg s on Tuesdays, Thursdays, and Saturdays

## 2010-04-01 NOTE — Letter (Signed)
Summary: Lipid Letter  [facility]  [address] [place], [place] [address]   Phone: [phone]  Fax: [phone]    10/26/2008  [name] [address] [place], [place]  [address]  Dear [name]:  We have carefully reviewed your last lipid profile from 10/25/08,   and the results are noted below with a summary of recommendations for lipid management.    Cholesterol:       215       HDL "good" Cholesterol:   62.95       LDL "bad" Cholesterol:         Triglycerides:       120.0      Total cholestrol and LDL are slightly above goal. All other labs look good. Please continue current meds as ongoing for now-no changes at this time. Attached a copy of your labs for your record.    LIFESTYLE RECOMMENDATIONS:   TLC Diet (Therapeutic Lifestyle Change): Saturated Fats & Transfatty acids should be kept < 7% of total calories ***Reduce Saturated Fats Polyunstaurated Fat can be up to 10% of total calories Monounsaturated Fat Fat can be up to 20% of total calories Total Fat should be no greater than 25-35% of total calories Carbohydrates should be 50-60% of total calories Protein should be approximately 15% of total calories Fiber should be at least 20-30 grams a day ***Increased fiber may help lower LDL Total Cholesterol should be < 200mg /day Consider adding plant stanol/sterols to diet (example: Benacol spread) ***A higher intake of unsaturated fat may reduce Triglycerides and Increase HDL    Adjunctive Measures (may lower LIPIDS and reduce risk of Heart Attack) include: Aerobic Exercise (20-30 minutes 3-4 times a week) Limit Alcohol Consumption Weight Reduction Aspirin 75-81 mg a day by mouth (if not allergic or contraindicated) Dietary Fiber 20-30 grams a day by mouth     Current Medications: 1)    Vitamin C 500 Mg Tabs (Ascorbic acid) .Marland Kitchen.. 1 tab by mouth once daily 2)    Calcium 500 Mg Tabs (Calcium carbonate) .... 2 tab by mouth once daily 3)     Cvs Vitamin D 2000 Unit Tabs (Cholecalciferol) .Marland Kitchen.. 1 tab by mouth once daily 4)    Folic Acid  .Marland Kitchen.. 1 tab by mouth once daily 5)    Digoxin 0.125 Mg Tabs (Digoxin) .Marland Kitchen.. 1 tab by mouth once daily 6)    Metoprolol Succinate 50 Mg Xr24h-tab (Metoprolol succinate) .Marland Kitchen.. 1 and 1/2 two times a day 7)    Coumadin 6 Mg Tabs (Warfarin sodium) .Marland Kitchen.. 1 tab tues, thur. sat 1 1/2 the rest of the week 8)    Oscal 500/200 D-3 500-200 Mg-unit Tabs (Calcium-vitamin d) .... 2 tabs by mouth once daily 9)    Fish Oil   Oil (Fish oil) .Marland Kitchen.. 1 tab by mouth once daily 10)    Glucosamine-chondroitin   Caps (Glucosamine-chondroit-vit c-mn) .Marland Kitchen.. 1 tab by mouth once daily 11)    Torsemide 20 Mg Tabs (Torsemide) .... 2 tabs by mouth once daily 12)    Potassium Chloride Crys Cr 20 Meq Cr-tabs (Potassium chloride crys cr) .... 2 tabs by mouth once daily  If you have any questions, please call. We appreciate being able to work with you.   Sincerely,    [facility] Newt Lukes MD

## 2010-04-01 NOTE — Medication Information (Signed)
Summary: ROV/lhk  Anticoagulant Therapy  Managed by: Weston Brass, PharmD Referring MD: Charlton Haws MD PCP: Newt Lukes MD Supervising MD: Gala Romney MD, Reuel Boom Indication 1: Atrial Fibrillation (ICD-427.31) Lab Used: [facility] Site: [facility] INR POC 2.8 INR RANGE 2 - 3  Dietary changes: no    Health status changes: no    Bleeding/hemorrhagic complications: no    Recent/future hospitalizations: no    Any changes in medication regimen? no    Recent/future dental: no  Any missed doses?: no       Is patient compliant with meds? yes       Allergies (verified): 1)  Demerol 2)  Percocet  Anticoagulation Management History:      The patient is taking warfarin and comes in today for a routine follow up visit.  Positive risk factors for bleeding include an age of 31 years or older.  The bleeding index is 'intermediate risk'.  Positive CHADS2 values include History of CHF, History of HTN, and Age > 34 years old.  The start date was 04/22/2007.  Her last INR was 1.6 RATIO.  Anticoagulation responsible provider: Melane Windholz MD, Reuel Boom.  INR POC: 2.8.  Cuvette Lot#: [id number].  Exp: 11/2009.    Anticoagulation Management Assessment/Plan:      The patient's current anticoagulation dose is Warfarin sodium 6 mg tabs: Use as directed by Anticoagulation Clinic.  The target INR is 2 - 3.  The next INR is due 01/17/2009.  Anticoagulation instructions were given to patient.  Results were reviewed/authorized by Weston Brass, PharmD.  She was notified by Marcheta Grammes, PharmD candidate.         Prior Anticoagulation Instructions: INR 3.2  Skip today's dose, then resume regimen of 1 1/2 tablet every day except 1 tablet on Tuesdays, Thursdays and Saturdays.  Current Anticoagulation Instructions: INR 2.8  Take1 tablet (6 mg) Tuesday, Thursday, and Saturday. Take 1.5 tablets (9 mg) all the other days of the week.

## 2010-04-01 NOTE — Letter (Signed)
Summary: Appointment - Reminder 2  [facility], Main Office  [address]   [place], [place] [address]   Phone: [phone]  Fax: [phone]     December 19, 2008 MRN: [id number]   [facility] [address] [place], [place]  [address]   Dear Ms. Hilscher,  Our records indicate that it is time to schedule a follow-up appointment with Dr. Eden Emms. It is very important that we reach you to schedule this appointment. We look forward to participating in your health care needs. Please contact us at the number listed above at your earliest convenience to schedule your appointment.  If you are unable to make an appointment at this time, give [place] a call so we can update our records.  Sincerely,   [name] Scheduling Team

## 2010-04-01 NOTE — Medication Information (Signed)
Summary: rov/sp  Anticoagulant Therapy  Managed by: Bethena Midget, RN, BSN Referring MD: Charlton Haws MD PCP: Newt Lukes MD Supervising MD: Antoine Poche MD, Fayrene Fearing Indication 1: Atrial Fibrillation (ICD-427.31) Lab Used: [facility] Site: [facility] INR POC 3.1 INR RANGE 2 - 3  Dietary changes: yes       Details: Eating less green leafy veggies  Health status changes: no    Bleeding/hemorrhagic complications: no    Recent/future hospitalizations: no    Any changes in medication regimen? no    Recent/future dental: no  Any missed doses?: no       Is patient compliant with meds? yes       Allergies: 1)  Demerol 2)  Percocet  Anticoagulation Management History:      The patient is taking warfarin and comes in today for a routine follow up visit.  Positive risk factors for bleeding include an age of 36 years or older.  The bleeding index is 'intermediate risk'.  Positive CHADS2 values include History of CHF, History of HTN, and Age > 72 years old.  The start date was 04/22/2007.  Her last INR was 1.6 RATIO.  Anticoagulation responsible provider: Antoine Poche MD, Fayrene Fearing.  INR POC: 3.1.  Cuvette Lot#: [id number].  Exp: 11/2010.    Anticoagulation Management Assessment/Plan:      The patient's current anticoagulation dose is Warfarin sodium 6 mg tabs: Take 1 1/2 tablet every day except 1 tablet on Tuesday, Thursday and Saturday or as directed by Anticoagulation Clinic.  The target INR is 2 - 3.  The next INR is due 09/26/2009.  Anticoagulation instructions were given to patient.  Results were reviewed/authorized by Bethena Midget, RN, BSN.  She was notified by Bethena Midget, RN, BSN.         Prior Anticoagulation Instructions: INR 2.0  Continue same dose of 1 1/2 tablets every day except 1 tablet on Tuesday, Thursday and Saturday.   Current Anticoagulation Instructions: INR 3.1 Today only take 3mg s then resume 9mg s everyday except 6mg s on Tuesdays, Thursdays and Saturdays. Recheck  in 4 weeks.

## 2010-04-03 NOTE — Medication Information (Signed)
Summary: rov/sp  Anticoagulant Therapy  Managed by: Bethena Midget, RN, BSN Referring MD: Charlton Haws MD PCP: Newt Lukes MD Supervising MD: Shirlee Latch MD, Doreena Maulden Indication 1: Atrial Fibrillation (ICD-427.31) Lab Used: [facility] Site: [facility] INR POC 3.2 INR RANGE 2 - 3  Dietary changes: no    Health status changes: no    Bleeding/hemorrhagic complications: no    Recent/future hospitalizations: no    Any changes in medication regimen? no    Recent/future dental: no  Any missed doses?: no       Is patient compliant with meds? yes       Allergies: 1)  Demerol 2)  Percocet  Anticoagulation Management History:      The patient is taking warfarin and comes in today for a routine follow up visit.  Positive risk factors for bleeding include an age of 14 years or older.  The bleeding index is 'intermediate risk'.  Positive CHADS2 values include History of CHF, History of HTN, and Age > 58 years old.  The start date was 04/22/2007.  Her last INR was 1.7.  Anticoagulation responsible provider: Shirlee Latch MD, Fianna Snowball.  INR POC: 3.2.  Cuvette Lot#: [id number].  Exp: 04/2011.    Anticoagulation Management Assessment/Plan:      The patient's current anticoagulation dose is Warfarin sodium 6 mg tabs: Take 1 1/2 tablet every day except 1 tablet on Tuesday, Thursday and Saturday or as directed by Anticoagulation Clinic.  The target INR is 2 - 3.  The next INR is due 03/06/2010.  Anticoagulation instructions were given to patient.  Results were reviewed/authorized by Bethena Midget, RN, BSN.  She was notified by Bethena Midget, RN, BSN.         Prior Anticoagulation Instructions: INR 3.0  Continue same dose of 1 1/2 tablets every day except 1 tablet on Tuesday, Thursday and Saturday.  Recheck INR in 3 weeks.   Current Anticoagulation Instructions: INR 3.2 Skip today's dose then resume 9mg s everyday except 6mg s on Tuesdays, Thursdays and Saturdays. Recheck in 2 weeks.

## 2010-04-03 NOTE — Letter (Signed)
Summary: [facility] Interval Note   [facility] Interval Note   Imported By: Roderic Ovens 03/13/2010 16:28:01  _____________________________________________________________________  External Attachment:    Type:   Image     Comment:   External Document

## 2010-04-03 NOTE — Medication Information (Signed)
Summary: rov/tm  Anticoagulant Therapy  Managed by: Bethena Midget, RN, BSN Referring MD: Charlton Haws MD PCP: Newt Lukes MD Supervising MD: Myrtis Ser MD,Takima Encina Indication 1: Atrial Fibrillation (ICD-427.31) Lab Used: [facility] Site: [facility] INR POC 3.3 INR RANGE 2 - 3  Dietary changes: no    Health status changes: no    Bleeding/hemorrhagic complications: no    Recent/future hospitalizations: no    Any changes in medication regimen? no    Recent/future dental: no  Any missed doses?: no       Is patient compliant with meds? yes       Allergies: 1)  Demerol 2)  Percocet  Anticoagulation Management History:      The patient is taking warfarin and comes in today for a routine follow up visit.  Positive risk factors for bleeding include an age of 70 years or older.  The bleeding index is 'intermediate risk'.  Positive CHADS2 values include History of CHF, History of HTN, and Age > 34 years old.  The start date was 04/22/2007.  Her last INR was 1.7.  Anticoagulation responsible provider: Myrtis Ser MD,Shekela Goodridge.  INR POC: 3.3.  Exp: 04/2011.    Anticoagulation Management Assessment/Plan:      The patient's current anticoagulation dose is Warfarin sodium 6 mg tabs: Take 1 1/2 tablet every day except 1 tablet on Tuesday, Thursday and Saturday or as directed by Anticoagulation Clinic.  The target INR is 2 - 3.  The next INR is due 03/20/2010.  Anticoagulation instructions were given to patient.  Results were reviewed/authorized by Bethena Midget, RN, BSN.  She was notified by Linward Headland PharmD candidate.         Prior Anticoagulation Instructions: INR 3.2 Skip today's dose then resume 9mg s everyday except 6mg s on Tuesdays, Thursdays and Saturdays. Recheck in 2 weeks.   Current Anticoagulation Instructions: INR 3.3 (goal INR=2-3)  Skip today's dose.  Take 1 tablet on Sundays, Tuesdays, Thursdays, and Saturdays and 1 and 1/2 tablets on Monday, Wednesday, and Friday.

## 2010-04-03 NOTE — Medication Information (Signed)
Summary: rov  Anticoagulant Therapy  Managed by: Weston Brass, PharmD Referring MD: Charlton Haws MD PCP: Newt Lukes MD Supervising MD: Ladona Ridgel MD, Sharlot Gowda Indication 1: Atrial Fibrillation (ICD-427.31) Lab Used: [facility] Site: [facility] INR POC 3.0 INR RANGE 2 - 3  Dietary changes: no    Health status changes: no    Bleeding/hemorrhagic complications: no    Recent/future hospitalizations: no    Any changes in medication regimen? no    Recent/future dental: no   Is patient compliant with meds? yes       Allergies: 1)  Demerol 2)  Percocet  Anticoagulation Management History:      The patient is taking warfarin and comes in today for a routine follow up visit.  Positive risk factors for bleeding include an age of 75 years or older.  The bleeding index is 'intermediate risk'.  Positive CHADS2 values include History of CHF, History of HTN, and Age > 33 years old.  The start date was 04/22/2007.  Her last INR was 1.7.  Anticoagulation responsible provider: Ladona Ridgel MD, Sharlot Gowda.  INR POC: 3.0.  Cuvette Lot#: [id number].  Exp: 03/2011.    Anticoagulation Management Assessment/Plan:      The patient's current anticoagulation dose is Warfarin sodium 6 mg tabs: Take 1 1/2 tablet every day except 1 tablet on Tuesday, Thursday and Saturday or as directed by Anticoagulation Clinic.  The target INR is 2 - 3.  The next INR is due 04/17/2010.  Anticoagulation instructions were given to patient.  Results were reviewed/authorized by Weston Brass, PharmD.  She was notified by Stephannie Peters, PharmD Candidate .         Prior Anticoagulation Instructions: INR 3.3 (goal INR=2-3)  Skip today's dose.  Take 1 tablet on Sundays, Tuesdays, Thursdays, and Saturdays and 1 and 1/2 tablets on Monday, Wednesday, and Friday.  Current Anticoagulation Instructions: INR 3.0  Coumadin 6 mg tablets - Continue 1 tablet every day except 1.5 on Mondays, Wednesdays, and Fridays

## 2010-04-10 DIAGNOSIS — I4891 Unspecified atrial fibrillation: Secondary | ICD-10-CM

## 2010-04-17 ENCOUNTER — Encounter: Payer: Self-pay | Admitting: Internal Medicine

## 2010-04-17 ENCOUNTER — Encounter (INDEPENDENT_AMBULATORY_CARE_PROVIDER_SITE_OTHER): Payer: Medicare Other

## 2010-04-17 DIAGNOSIS — I4891 Unspecified atrial fibrillation: Secondary | ICD-10-CM

## 2010-04-17 DIAGNOSIS — Z7901 Long term (current) use of anticoagulants: Secondary | ICD-10-CM

## 2010-04-17 LAB — CONVERTED CEMR LAB: POC INR: 3.5

## 2010-04-23 NOTE — Medication Information (Signed)
Summary: ccr/tmj  Anticoagulant Therapy  Managed by: Windell Hummingbird, RN Referring MD: Charlton Haws MD PCP: Newt Lukes MD Supervising MD: Tenny Craw MD, Gunnar Fusi Indication 1: Atrial Fibrillation (ICD-427.31) Lab Used: [facility] Site: [facility] INR POC 3.5 INR RANGE 2 - 3  Dietary changes: no    Health status changes: no    Bleeding/hemorrhagic complications: no    Recent/future hospitalizations: no    Any changes in medication regimen? no    Recent/future dental: no  Any missed doses?: no       Is patient compliant with meds? yes       Allergies: 1)  Demerol 2)  Percocet  Anticoagulation Management History:      The patient is taking warfarin and comes in today for a routine follow up visit.  Positive risk factors for bleeding include an age of 40 years or older.  The bleeding index is 'intermediate risk'.  Positive CHADS2 values include History of CHF, History of HTN, and Age > 24 years old.  The start date was 04/22/2007.  Her last INR was 1.7.  Anticoagulation responsible provider: Tenny Craw MD, Gunnar Fusi.  INR POC: 3.5.  Cuvette Lot#: [id number].  Exp: 03/2011.    Anticoagulation Management Assessment/Plan:      The patient's current anticoagulation dose is Warfarin sodium 6 mg tabs: Take 1 1/2 tablet every day except 1 tablet on Tuesday, Thursday and Saturday or as directed by Anticoagulation Clinic.  The target INR is 2 - 3.  The next INR is due 05/02/2010.  Anticoagulation instructions were given to patient.  Results were reviewed/authorized by Windell Hummingbird, RN.  She was notified by Windell Hummingbird, RN.         Prior Anticoagulation Instructions: INR 3.0  Coumadin 6 mg tablets - Continue 1 tablet every day except 1.5 on Mondays, Wednesdays, and Fridays   Current Anticoagulation Instructions: INR 3.5 Skip today's dose.  Then start taking 1 tablet every day, except on Mondays and Fridays.

## 2010-05-02 ENCOUNTER — Encounter (INDEPENDENT_AMBULATORY_CARE_PROVIDER_SITE_OTHER): Payer: Medicare Other

## 2010-05-02 ENCOUNTER — Encounter: Payer: Self-pay | Admitting: Cardiovascular Disease

## 2010-05-02 DIAGNOSIS — I4891 Unspecified atrial fibrillation: Secondary | ICD-10-CM

## 2010-05-02 DIAGNOSIS — Z7901 Long term (current) use of anticoagulants: Secondary | ICD-10-CM

## 2010-05-02 LAB — CONVERTED CEMR LAB: POC INR: 3.2

## 2010-05-08 NOTE — Medication Information (Signed)
Summary: rov/pc  Anticoagulant Therapy  Managed by: Cloyde Reams, RN, BSN Referring MD: Charlton Haws MD PCP: Newt Lukes MD Supervising MD: Clifton James MD, Cristal Deer Indication 1: Atrial Fibrillation (ICD-427.31) Lab Used: [facility] Site: [facility] INR POC 3.2 INR RANGE 2 - 3  Dietary changes: no    Health status changes: no    Bleeding/hemorrhagic complications: no    Recent/future hospitalizations: no    Any changes in medication regimen? no    Recent/future dental: no  Any missed doses?: no       Is patient compliant with meds? yes      Comments: Pt states she has been taking 1 tablet daily except 1.5 tablets on Sundays, Mondays, Wednesdays, and Fridays.    Allergies: 1)  Demerol 2)  Percocet  Anticoagulation Management History:      The patient is taking warfarin and comes in today for a routine follow up visit.  Positive risk factors for bleeding include an age of 75 years or older.  The bleeding index is 'intermediate risk'.  Positive CHADS2 values include History of CHF, History of HTN, and Age > 30 years old.  The start date was 04/22/2007.  Her last INR was 1.7.  Anticoagulation responsible provider: Clifton James MD, Cristal Deer.  INR POC: 3.2.  Cuvette Lot#: [id number].  Exp: 03/2011.    Anticoagulation Management Assessment/Plan:      The patient's current anticoagulation dose is Warfarin sodium 6 mg tabs: Take 1 1/2 tablet every day except 1 tablet on Tuesday, Thursday and Saturday or as directed by Anticoagulation Clinic.  The target INR is 2 - 3.  The next INR is due 05/16/2010.  Anticoagulation instructions were given to patient.  Results were reviewed/authorized by Cloyde Reams, RN, BSN.  She was notified by Cloyde Reams RN.         Prior Anticoagulation Instructions: INR 3.5 Skip today's dose.  Then start taking 1 tablet every day, except on Mondays and Fridays.   Current Anticoagulation Instructions: INR 3.2  Take 1/2 tablet today, then start  taking 1 tablet daily except 1.5 tablets on Mondays, Wednesdays, and Fridays.  Recheck in 2 weeks.

## 2010-05-16 ENCOUNTER — Encounter: Payer: Self-pay | Admitting: Cardiology

## 2010-05-16 ENCOUNTER — Encounter (INDEPENDENT_AMBULATORY_CARE_PROVIDER_SITE_OTHER): Payer: Medicare Other

## 2010-05-16 DIAGNOSIS — I4891 Unspecified atrial fibrillation: Secondary | ICD-10-CM

## 2010-05-16 DIAGNOSIS — Z7901 Long term (current) use of anticoagulants: Secondary | ICD-10-CM

## 2010-05-16 LAB — CONVERTED CEMR LAB: POC INR: 2.4

## 2010-05-20 NOTE — Medication Information (Signed)
Summary: rov/ewj  Anticoagulant Therapy  Managed by: Samantha Crimes, PharmD Referring MD: Charlton Haws MD PCP: Newt Lukes MD Supervising MD: Daleen Squibb MD, Maisie Fus Indication 1: Atrial Fibrillation (ICD-427.31) Lab Used: [facility] Site: [facility] INR POC 2.4 INR RANGE 2 - 3  Dietary changes: no    Health status changes: no    Bleeding/hemorrhagic complications: no    Recent/future hospitalizations: no    Any changes in medication regimen? no    Recent/future dental: no  Any missed doses?: no       Is patient compliant with meds? yes       Current Medications (verified): 1)  Vitamin C 500 Mg Tabs (Ascorbic Acid) .Marland Kitchen.. 1 Tab By Mouth Once Daily 2)  Calcium 500 Mg Tabs (Calcium Carbonate) .... 2 Tab By Mouth Once Daily 3)  Cvs Vitamin D 2000 Unit Tabs (Cholecalciferol) .Marland Kitchen.. 1 Tab By Mouth Once Daily 4)  Metoprolol Succinate 50 Mg Xr24h-Tab (Metoprolol Succinate) .Marland Kitchen.. 1 and 1/2 Two Times A Day 5)  Warfarin Sodium 6 Mg Tabs (Warfarin Sodium) .... Take 1 1/2 Tablet Every Day Except 1 Tablet On Tuesday, Thursday and Saturday or As Directed By Anticoagulation Clinic 6)  Oscal 500/200 D-3 500-200 Mg-Unit Tabs (Calcium-Vitamin D) .... 2 Tabs By Mouth Once Daily 7)  Fish Oil   Oil (Fish Oil) .Marland Kitchen.. 1 Tab By Mouth Once Daily 8)  Glucosamine-Chondroitin   Caps (Glucosamine-Chondroit-Vit C-Mn) .Marland Kitchen.. 1 Tab By Mouth Once Daily 9)  Torsemide 20 Mg Tabs (Torsemide) .... Take 1 Tablet Two Times A Day 10)  Potassium Chloride Crys Cr 20 Meq Cr-Tabs (Potassium Chloride Crys Cr) .Marland Kitchen.. 1 Tabs By Mouth Bid  Allergies (verified): 1)  Demerol 2)  Percocet  Anticoagulation Management History:      Positive risk factors for bleeding include an age of 75 years or older.  The bleeding index is 'intermediate risk'.  Positive CHADS2 values include History of CHF, History of HTN, and Age > 75 years old.  The start date was 04/22/2007.  Her last INR was 1.7.  Anticoagulation responsible provider: Daleen Squibb  MD, Maisie Fus.  INR POC: 2.4.  Exp: 03/2011.    Anticoagulation Management Assessment/Plan:      The patient's current anticoagulation dose is Warfarin sodium 6 mg tabs: Take 1 1/2 tablet every day except 1 tablet on Tuesday, Thursday and Saturday or as directed by Anticoagulation Clinic.  The target INR is 2 - 3.  The next INR is due 05/16/2010.  Anticoagulation instructions were given to patient.  Results were reviewed/authorized by Samantha Crimes, PharmD.         Prior Anticoagulation Instructions: INR 3.2  Take 1/2 tablet today, then start taking 1 tablet daily except 1.5 tablets on Mondays, Wednesdays, and Fridays.  Recheck in 2 weeks.    Current Anticoagulation Instructions: Return to clinic on 4/4 @ 315 Cont with current regim

## 2010-06-04 ENCOUNTER — Ambulatory Visit (INDEPENDENT_AMBULATORY_CARE_PROVIDER_SITE_OTHER): Payer: Medicare Other | Admitting: *Deleted

## 2010-06-04 DIAGNOSIS — I4891 Unspecified atrial fibrillation: Secondary | ICD-10-CM

## 2010-06-04 DIAGNOSIS — Z7901 Long term (current) use of anticoagulants: Secondary | ICD-10-CM

## 2010-06-04 LAB — POCT INR: INR: 2.7

## 2010-06-09 ENCOUNTER — Other Ambulatory Visit: Payer: Self-pay

## 2010-06-09 MED ORDER — WARFARIN SODIUM 6 MG PO TABS: 6.0000 mg | ORAL_TABLET | ORAL | Status: DC

## 2010-07-04 ENCOUNTER — Ambulatory Visit (INDEPENDENT_AMBULATORY_CARE_PROVIDER_SITE_OTHER): Payer: Medicare Other | Admitting: *Deleted

## 2010-07-04 DIAGNOSIS — I4891 Unspecified atrial fibrillation: Secondary | ICD-10-CM

## 2010-07-04 LAB — POCT INR: INR: 1.7

## 2010-07-15 NOTE — Op Note (Signed)
NAMEESTEEN, DELPRIORE NO.:  [id number]   MEDICAL RECORD NO.:  [id number]          PATIENT TYPE:  INP   LOCATION:  5030                         FACILITY:  [facility]   PHYSICIAN:  Feliberto Gottron. Turner Daniels, M.D.   DATE OF BIRTH:  1922/05/27   DATE OF PROCEDURE:  09/05/2007  DATE OF DISCHARGE:                               OPERATIVE REPORT   PREOPERATIVE DIAGNOSIS:  End-stage arthritis right knee.   POSTOPERATIVE DIAGNOSIS:  End-stage arthritis right knee.   PROCEDURE:  Right total knee arthroplasty using DePuy Sigma RP  components all cemented, DePuy HV cement two batches with a double batch  of Zinacef 1500 mg in the cement.  We used a #3 femur, a #3 tibia, a 10-  mm Sigma RP rotating bearing and a 35-mm patellar button.   SURGEON:  Feliberto Gottron.  Turner Daniels, MD   FIRST ASSISTANT:  Shirl Harris, PA-C   ANESTHETIC:  General endotracheal.   ESTIMATED BLOOD LOSS:  Minimal.   FLUID REPLACEMENT:  1200 mL crystalloid.   DRAINS PLACED:  Two medium Hemovacs and a Foley catheter.   TOURNIQUET TIME:  1 hour 45 minutes.   INDICATIONS FOR PROCEDURE:  This is an 77 year old woman who has already  had both of her hips replaced by me in the last few years and now  desires elective right total knee arthroplasty for end-stage arthritis  really of both knees but the right hurts the worse.  She has bone-on-  bone arthritic changes and has failed conservative treatment, anti-  inflammatory medicines, attempts at exercise, she can barely ambulate  now, and has also failed judicious use of narcotics, cortisone  injections, and viscosupplementation.  Risks and benefits of surgery  have been discussed, questions answered.   DESCRIPTION OF PROCEDURE:  The patient identified by armband and in the  block area underwent femoral nerve block anesthesia on the right.  She  then received 2 g of Ancef IV.  She was then taken to the operating room  5 where the appropriate anesthetic monitors were  reattached and general  endotracheal anesthesia induced with the patient in supine position.  A  Foley catheter was then inserted.  Lateral post and foot positioner  applied to the right side of the table.  Tourniquet applied high to the  right thigh and the right lower extremity prepped and draped in the  usual sterile fashion from the ankle to the hemipelvis.  Time-out  procedure was then performed.  Limb was wrapped with an Esmarch bandage,  tourniquet inflated to 350 mmHg with the knee bent and we began the  actual procedure by making the anterior midline incision starting  handbreadth above the patella going over the patella 1 cm medial, 2 and  3 to 4 cm distal to the tibial tubercle.  Small bleeders in the  subcutaneous tissue were identified and cauterized.  Transverse  retinaculum was incised and reflected medially allowing a medial  parapatellar arthrotomy.  Prepatellar fat pad was resected.  The  superficial medial collateral ligament was elevated off the proximal  tibia going from anterior to posterior and leaving intact  distally.  At  this point, the patella was everted and the knee was hyperflexed  exposing the joint itself.  Using small osteotomes and rongeurs,  peripheral osteophytes and notch osteophytes on the tibial spines were  removed.  The ACL and the PCL were resected.  The anterior one-half of  the menisci resected and then retractors were placed with a  posteromedial Z retractor, a McCullough retractor through the notch and  a lateral Homan retractor.  The knee was hyperflexed exposing the  proximal tibia which was then entered with the DePuy step drill followed  by the intramedullary rod set for 2 degrees posterior slope.  This was  then pinned in place allowing resection of 8-9 mm of bone medially and  laterally.  Using the power oscillating saw, the proximal tibia was then  removed with a mother-in-law clamp and the electrocautery.  Bone quality  was noted to be  moderate at this point.  We then entered the distal  femur 2 mm anterior to the PCL origin with a step drill followed by the  IM rod and a 5 degree right distal femoral cutting guide set at 11 mm.  This was pinned in place along the epicondylar axis and the distal  femoral cut accomplished without difficulty.  We then sized for #3 right  femoral component, once again set the foot pad along the epicondylar  axis set for neutral rotation since the arthritis was evenly divided  between the medial and lateral compartment and this was then pinned.  A  #3 femoral cutting guide was then placed over the pins.  Cross pins were  used to fix it in place the screwing type and then the anterior-  posterior chamfer cuts accomplished without difficulty followed by the  DePuy Sigma RP box cut.  The patella was then measured at 21 mm cutting  guide set at 14, the posterior 7-8 mm of the patella resected, sized for  a 35 patellar button and drilled.  Once again the knee was hyperflexed.  Proximal tibia exposed and measured for a #3 tibial baseplate.  This was  then pinned into place followed by the conical reamer and the Deltafit  keel punch.  A #3 right femoral trial was then hammered into place.  The  lug nuts were drilled.  A 10-mm trial Sigma RP bearing was snapped on  the tibial trial, 35 button snapped on the patella.  The knee reduced  and taken through range of motion.  Stability was noted to be excellent  in extension, 45 of flexion and about 95 of flexion.  At this point, the  trial components were removed.  All bony surfaces were Waterpik, clean  and dry with suction and sponges, a double batch of DePuy HV cement with  1500 mg of Zinacef was then mixed and applied to all bony metallic  mating surfaces except for the posterior condyles of the femur itself.  In order we hammered into place a #3 tibial baseplate and removed excess  cement with Freer elevators and #3 right femoral component removed   excess cement and a 35-mm patellar button removed excess cement.  The 10-  mm Sigma RP bearing was then snapped into place.  The knee once again  reduced and the cement allowed to cure.  Medium Hemovac drains were then  placed deep in the wound from the lateral side.  We checked our patellar  tracking one more time and irrigated out with normal saline solution.  Parapatellar arthrotomy closed with running #1 Vicryl suture, the  subcutaneous tissue with two layers of 0 Vicryl suture because of her  adipose tissue, 2-0 Vicryl suture and then skin staples.  Dressing of  Xeroform, 4x4 dressing, sponges, Webril and Ace wrap was then applied.  Tourniquet let down.  The foot was noted to pink up immediately.  The  patient was then awakened and taken to recovery room without difficulty.      Feliberto Gottron. Turner Daniels, M.D.  Electronically Signed     FJR/MEDQ  D:  09/05/2007  T:  09/06/2007  Job:  [id number]

## 2010-07-15 NOTE — Consult Note (Signed)
[name], [name] NO.:  [id number]   MEDICAL RECORD NO.:  [id number]          PATIENT TYPE:  INP   LOCATION:  3703                         FACILITY:  [facility]   PHYSICIAN:  Lowella Bandy, MD      DATE OF BIRTH:  15-Feb-1923   DATE OF CONSULTATION:  04/01/2007  DATE OF DISCHARGE:                                 CONSULTATION   REFERRING PHYSICIAN:  Feliberto Gottron. Turner Daniels, M.D.]   PRIMARY CARDIOLOGIST:  Noralyn Pick. Eden Emms, MD, [facility]   REASON FOR CONSULTATION:  Atrial fibrillation with rapid ventricular  response.   HISTORY OF PRESENT ILLNESS:  [name] is a very pleasant 77-year-  old female with chronic atrial fibrillation who was admitted for  elective right hip replacement for degenerative joint disease.  She had  been evaluated preoperatively by Dr. Eden Emms who increased her Toprol-XL  to 50 mg b.i.d. for improved rate control.  He did not recommend efforts  at cardioversion given the chronicity of her atrial fibrillation and the  likelihood of a recurrence perioperatively.  Her left ventricular  function is normal.  On admission her initial EKG showed atrial  fibrillation with a rate of 83 beats per minute.  However, now two days  postoperatively, she has become progressively more tachycardic on her  vital sign checks. She is not presently on telemetry monitoring, but her  heart rate was noted to be as high as 130.  She denies any chest pain or  associated shortness of breath and other than being told that she was  tachycardic, was unaware of her rapid heart rate.  She is in significant  pain in her right hip.   PAST MEDICAL HISTORY:  1. Chronic atrial fibrillation.  2. Breast cancer, status post lumpectomy in 2007.  3. Status post previous left hip replacement in 2006.  4. Status post back surgery in 1959.   CURRENT MEDICATIONS:  1. Vitamin C 500 mg p.o. daily.  2. Colace 100 mg b.i.d.  3. Lovenox 40 mg subcutaneously once a day.  4. Toprol-XL 50 mg p.o.  b.i.d.  5. Lovaza 1 g daily.  6. Potassium 10 mEq p.o. every other day.  7. Coumadin 5 mg.  8. Oxycodone p.r.n.  9. Robaxin p.r.n.   SOCIAL HISTORY:  The patient is a nonsmoker.  Does not drink alcohol.  She is from [place].   FAMILY HISTORY:  Mother died at age 83 of a heart attack.  Father died  at age 46 of an aortic aneurysm.   REVIEW OF SYSTEMS:  Positive for increased sinus drainage and  significant hip pain.  Otherwise consistent review is negative other  than those noted above in the HPI.   PHYSICAL EXAMINATION:  VITAL SIGNS:  Blood pressure 120/68, pulse 125.  GENERAL:  The patient is breathing comfortably, uncomfortable but not in  any distress.  HEENT:  Normocephalic, atraumatic.  Sclerae anicteric.  Oropharynx  clear.  NECK:  Supple.  No carotid bruits or JVD.  CARDIOVASCULAR:  Irregularly irregular rhythm.  Tachycardic.  No  murmurs, rubs or gallops appreciated.  CHEST:  Clear to auscultation  bilaterally.  ABDOMEN:  Soft, nontender, nondistended.  Normal bowel sounds.  EXTREMITIES:  Warm, trace pedal edema.  SKIN:  No rashes.  NEUROLOGIC:  Alert and oriented x3.  Cranial nerves grossly intact.  Moving all extremities well.  MUSCULOSKELETAL:  Status post right hip replacement with incision  intact.   EKG reviewed which shows atrial fibrillation with rapid ventricular  response at 127 beats per minute, nonspecific T wave abnormality.   LABORATORY DATA:  Laboratory values reviewed show hemoglobin 10.2,  hematocrit 29.6, white blood cell count 10.5, platelets 128.   ASSESSMENT:  An 77 year old female with chronic atrial fibrillation who  has developed rapid ventricular response with her atrial fibrillation,  postoperatively after hip surgery.  She is relatively asymptomatic from  a standpoint of her atrial fibrillation and at present is  hemodynamically stable.   RECOMMENDATIONS:  1. Transfer her to telemetry floor where her heart rate could be       monitored closer.  In the interim, we will initiate diltiazem 60 mg      p.o. q.6h. orally.  If this does not achieve rate control, she may      need a diltiazem drip.  2. Continue her Toprol-XL 50 mg b.i.d.  3. Continue her Coumadin therapy.  She will likely need chronic      anticoagulation with a target INR of 2 to 3 for her atrial      fibrillation.  4. Her heart rate also could be exacerbated by pain as well as mild      blood loss.  We would recommend aggressive management of her pain      as you were doing as well as checking daily hemoglobin to insure      that blood loss is not significantly contributing to her      tachycardia.      Lowella Bandy, MD  Electronically Signed     JJC/MEDQ  D:  04/02/2007  T:  04/02/2007  Job:  [id number]

## 2010-07-15 NOTE — Assessment & Plan Note (Signed)
HEALTHCARE                            CARDIOLOGY OFFICE NOTE   NAME:[name], [name]                    MRN:          [id number]  DATE:10/21/2007                            DOB:          Oct 09, 1922    HISTORY OF PRESENT ILLNESS:  A 77 year old patient that we see for  diastolic heart failure, AFib, and lower extremity edema.   Unfortunately, [name] does not have a primary care MD.  She lives in  [place].  She is currently staying with family in town.  She had a  right knee replacement performed in February.  She has not had both hips  and a right knee replaced.  Unfortunately, her left knee is a real  problem and she may need further surgery for this.  She tends to get  volume overloaded in the perioperative days.  She had a BNP in the 300s  and some dyspnea, which required IV Lasix.   She was seen in the hospital by Dr. Dietrich Pates.  She needs to have her  Coumadin level checked today.  She is in chronic AFib.   Outside of her left knee pain, in general she is doing good.  She has  had to a 2 or 3 episodes of chest pain.  She does not have documented  coronary artery disease.  The chest pain is somewhat atypical, it is  sharp, it is on the left side, it is related to motion of the upper  extremities.  I told [name] that given her age and chest pain, she should  have a Myoview.  She has never had one and there is orthopedic surgery  in her future.   REVIEW OF SYSTEMS:  Remarkable for lower extremity edema.  She needs to  be back on Lasix.  Her Coumadin level has been somewhat high at 4.6, it  needs to be rechecked today.  She has not had any bleeding diathesis.   MEDICATIONS:  1. Digoxin 0.25 a day.  2. Lasix to be reinstituted at 20 a day.  3. Potassium 10 a day.  4. Lopressor 25 b.i.d.  5. Coumadin as directed.  6. Folic acid.  7. Fish oil.   PHYSICAL EXAMINATION:  GENERAL:  Remarkable for an elderly white female,  in a wheelchair.  She  is ambulatory with a walker.  VITAL SIGNS:  Weight is 187, blood pressure is 115/70, pulse 76 and  regular, respiratory rate 14, afebrile.  HEENT:  Unremarkable.  Carotids are normal without bruit, no  lymphadenopathy, no thyromegaly, no JVP elevation.  LUNGS:  Clear.  Good diaphragmatic motion.  No wheezing.  S1 and S2 with  a soft systolic murmur.  PMI normal.  ABDOMEN:  Benign.  Bowel sounds positive.  No AAA, no tenderness, no  bruit, no hepatosplenomegaly, no hepatojugular reflux, no tenderness,  status post bilateral hip replacements, status post right knee  replacement.  EXTREMITIES:  Femorals are +3 bilaterally.  Distal pulses are intact  with +2 lower extremity edema on the right, +2 lower extremity edema in  the left.   EKG shows AFib with a digoxin effect.  IMPRESSION:  1. Atrial fibrillation, good rate control and follow up PT/INR today.  2. Lower extremity edema with history of diastolic heart failure.  Add      Lasix and potassium.  Followup BMET when she gets her next protime      checked.  3. Chest pain atypical in an elderly patient who needs more orthopedic      surgery based on abnormal EKG.  Followup adenosine Myoview.  4. Orthopedic issues.  Follow up with orthopedic doctors.  I suspect      she will proceed with left knee surgery some time in the next 3      months.  From a cardiac perspective, we would like to be consulted      to manage her volume status, which has been an issue in 2 of her      orthopedic surgeries.  She has normal left ventricular function by      recent echocardiogram.   Her clinical course is usually consistent with diastolic heart failure.     [name]. [name] Emms, MD, [facility]  Electronically Signed    PCN/MedQ  DD: 10/21/2007  DT: 10/21/2007  Job #: [id number]

## 2010-07-15 NOTE — Assessment & Plan Note (Signed)
[facility]                            CARDIOLOGY OFFICE NOTE   NAME:[name], [name]                    MRN:          [id number]  DATE:06/09/2007                            DOB:          16-Aug-1922    [name] returns today for follow-up.  She has chronic atrial fibrillation  on Coumadin.  She recently underwent hip surgery.  She now has bilateral  hip replacements.  She may need her left knee done soon.  Her rehab has  been a little bit difficult in regards to pain in her knee and lower  extremity swelling.  We had her on hydrochlorothiazide but this has not  been sufficient.  She needs to tighten up her low-sodium diet.  She has  significant lower extremity edema.  I talked to [facility] at length that we  will be making multiple medication changes to try to improve this.  However, I did tell her to continue to consult with Dr. Turner Daniels about  possible future surgery for her left knee.  Outside of the lower  extremity edema, she has not had any significant palpitations.  There  has been no PND or orthopnea.  There has been no chest pain.   She has normal LV function by echo.   REVIEW OF SYSTEMS:  Otherwise negative.   MEDICATIONS:  Os-Cal, Centrum, vitamin C, folic acid.  Her Toprol will  be increased from 50 b.i.d. to 75 b.i.d.  Her Cardizem will be stopped.  We will add digoxin 0.25 mg a day.  She will continue her Coumadin.  Her  hydrochlorothiazide will be stopped and we will substitute Lasix 20  b.i.d.  Her potassium will be increased from 10 a day to 20 a day.   ALLERGIES:  She has no known allergies.   PHYSICAL EXAMINATION:  VITAL SIGNS:  Remarkable for weight of 198 which  is the same as previous.  Affect is appropriate.  Blood pressure is  126/75, pulses 84 and irregular, respiratory rate 14, afebrile.  HEENT:  Unremarkable.  NECK:  Carotids normal without bruit, no lymphadenopathy, thyromegaly or  JVP elevation.  LUNGS:  Clear with good  diaphragmatic motion.  No wheezing.  CARDIOVASCULAR:  S1 and S2, normal heart sounds, PMI normal.  ABDOMEN:  Benign.  Bowel sounds positive, no AAA, no tenderness, no  hepatosplenomegaly, no hepatojugular reflux.  No bruit.  EXTREMITIES:  Distal pulses are intact.  There is +2 to 3 lower  extremity edema bilaterally.  PTs are +1 bilaterally.  NEURO:  Nonfocal.  SKIN:  Warm and dry.  MUSCULOSKELETAL:  She has some weakness with ambulation and left knee  pain.   She has a four-point walker with her.   IMPRESSION:  1. Atrial fibrillation, stable, rate control to be adjusted after      stopping Cardizem.  We will add digoxin.  Follow-up Coumadin clinic      today.  2. Lower extremity edema.  Cardizem to be stopped.  Diuretic to be      strengthened with b.i.d. Lasix.  Improve low-sodium diet.  Follow-      up in  four weeks with BMET and BMP.  Consider venous Duplex after      this.  3. Orthopedic issues.  Follow-up with Dr. Turner Daniels.  We will clear up her      lower extremity edema and improve her rate control.  There is no      reason for her to delay any further evaluation for possible left      knee surgery.   I will see her back in about four weeks.     Noralyn Pick. Eden Emms, MD, [facility]  Electronically Signed    PCN/MedQ  DD: 06/09/2007  DT: 06/09/2007  Job #: [id number]

## 2010-07-15 NOTE — Discharge Summary (Signed)
[name], [name] NO.:  [id number]   MEDICAL RECORD NO.:  [id number]          PATIENT TYPE:  INP   LOCATION:  5013                         FACILITY:  [facility]   PHYSICIAN:  Feliberto Gottron. Turner Daniels, M.D.   DATE OF BIRTH:  1922-11-26   DATE OF ADMISSION:  09/05/2007  DATE OF DISCHARGE:  09/14/2007                               DISCHARGE SUMMARY   CHIEF COMPLAINT:  Right knee pain.   HISTORY OF PRESENT ILLNESS:  [name] is an 77 year old lady  complaining of unremitting knee pain despite conservative treatment with  injection, anti-inflammatories and narcotic pain medicines.  She  describes a surgical intervention at this time and all risks and  benefits of surgery have been discussed.   PAST MEDICAL HISTORY:  Her past medical history is significant for  atrial fibrillation, breast cancer that was treated surgically.   PAST SURGICAL HISTORY:  Significant for lumpectomy, a right total hip  arthroplasty, left total hip arthroplasty.   SOCIAL HISTORY:  She is a nonsmoker and does not drink alcohol.  She  lives with her brother.   FAMILY HISTORY:  Noncontributory.   ALLERGIES:  She has no known drug allergies.   CURRENT MEDICATIONS:  1. Toprol-XL 75 mg two tabs p.o. daily.  2. Coumadin 6 mg one p.o. daily.  As of Tuesday she takes 9 mg of      Coumadin.  3. Digoxin 25 mg one p.o. daily.  4. Torsemide 20 mg one p.o. daily p.r.n. fluid retention.  5. Glucosamine plus MSM.  6. Centrum Silver vitamins.  7. Os-Cal with vitamin D.  8. She takes vitamin C 500 mg daily.  9. Vitamin D 1000 international units daily.  10.Folic acid 400 mcg one p.o. daily.   PHYSICAL EXAMINATION:  Gross examination of the right lower extremity  demonstrates the patient to have a valgus deformity at the knee.  Range  of motion is estimated to be 5-90 degrees.  She is neurovascularly  intact.   X-rays of the right hip demonstrate bone on bone degenerative joint  disease in the lateral  compartment of the knee.   PREOPERATIVE LABS:  White blood cells 8.9, red blood cells 4.58,  hemoglobin 14.4, hematocrit 42.5, platelet count 252, PT 25, INR 2.2,  sodium 143, potassium 3.8, chloride 102, glucose 108, BUN 18, creatinine  1.07.   HOSPITAL COURSE:  [name] was admitted to [facility] on  September 05, 2007, at which time she underwent a right total hip arthroplasty  using the DePuy system.  Dr. Turner Daniels was the surgeon.  [name]  tolerated the procedure very well.  Foley was placed perioperatively and  she was transferred to the orthopedic floor.  On the first postoperative  day the patient was tolerating p.o. intake well and was not complaining  of any nausea or vomiting.  She had no shortness of breath at that time.  On the second postoperative day [name] began complaining of  shortness of breath.  She was encouraged to use her incentive  spirometer.  She continued to tolerate p.o. intake.  Her wound was  benign with no drainage.  On the third postoperative day Dr. Turner Daniels  consulted with cardiology.  [name] continued to complain of  shortness of breath but she was given a dose of Dermedex which seemed to  improve the shortness of breath.  She had been evaluated by physical  therapy at this time but had not tried walking yet.  Her flexion with  continuous passive motion machine was at 50 degrees.  After being  evaluated by cardiology [name] was moved to telemetry floor where  she could be monitored more closely.  She was given diuretics for volume  overload related to her atrial fibrillation and congestive heart  failure.  Throughout her stay on the telemetry floor she complained of  shortness of breath that improved slowly with the diuresis.  Her wound  was benign.  The dressing was changed.  She did not work much with  physical therapy because of her shortness of breath.   On the sixth postoperative day [name] right lower  extremity had  an increase in edema and was tender to palpation and so a Doppler study  was ordered.  She continued to report improvement in her shortness of  breath.  Her vital signs remained stable.  The wound appeared benign.  On the seventh postoperative day the Doppler study had been completed  and showed no evidence of a DVT.  She was transferred back to the  orthopedic floor at that time after being stable on telemetry for  several days.  On that day she was able to ambulate approximately 15  feet with physical therapy.  On the eighth postoperative day Ms.  [name] was evaluated and she was back up on the orthopedic floor.  She reported improvement in her shortness of breath.  She stated that  she had been passing flatus and stool and tolerating p.o. intake well.  Her Foley catheter was taken out.  On the ninth postoperative day Ms.  [name] was ambulating independently.  She had walked 55 feet with  physical therapy the previous day.  She continued to tolerate p.o.  intake and pass stool and flatus.  Her incision was benign without any  drainage or erythema and so she was discharged back to the [facility] facility.   DISPOSITION:  [name] will be discharged back to [facility]  nursing facility for skilled nursing.  They will manage her wounds and  her physical therapy as well as monitoring her PT and INR.  Her  discharge medicines will be as per the HMR with the addition of Vicodin  5__________ mg tablets 1-2 tabs p.o. q.4 hours p.r.n. pain.  She will be  maintained on her normal high dose of Coumadin for DVT prophylaxis.  She  will be weightbearing as tolerated with her walker.  She will return to  the clinic in approximately 2 days for surgical staple removal.  We have  requested that a CPM machine be used at her nursing facility and she  will be 0-50 degrees with a 10 degree increase per day then CPM machine  will be discontinued once she has  metastatic 90 degrees.  She will  follow up with her cardiologist in regards to her shortness of breath.   FINAL DIAGNOSIS:  Right knee end-stage degenerative joint disease.   SECONDARY DIAGNOSES:  Shortness of breath secondary to atrial  fibrillation and congestive heart failure and hyponatremia.     Shirl Harris, [place]  Feliberto Gottron. Turner Daniels, M.D.  Electronically Signed   JW/MEDQ  D:  09/14/2007  T:  09/14/2007  Job:  [id number]

## 2010-07-15 NOTE — Op Note (Signed)
[name], [name]             ACCOUNT NO.:  [id number]   MEDICAL RECORD NO.:  [id number]          PATIENT TYPE:  INP   LOCATION:  2550                         FACILITY:  [facility]   PHYSICIAN:  Feliberto Gottron. Turner Daniels, M.D.   DATE OF BIRTH:  12/17/1922   DATE OF PROCEDURE:  03/30/2007  DATE OF DISCHARGE:                               OPERATIVE REPORT   PREOPERATIVE DIAGNOSIS:  End stage arthritis, right hip.   POSTOPERATIVE DIAGNOSIS:  End stage arthritis, right hip.   PROCEDURE:  Hybrid right total hip arthroplasty using a DePuy #4 Summit  stem cemented, 11 mm tip #3 cement restricter, and a plus 0 45 mm  ultimate ball, and a 50 mm ASR cup.   SURGEON:  Feliberto Gottron. Turner Daniels, M.D.   FIRST ASSISTANT:  [name] B. Su Hilt, P.A.-C.   ANESTHESIA:  General endotracheal.   ESTIMATED BLOOD LOSS:  200 mL.   FLUID REPLACEMENT:  1200 mL crystalloid.   DRAINS:  Foley catheter.   URINE OUTPUT:  300 mL.   INDICATIONS FOR PROCEDURE:  77 year old woman status post successful  left total hip arthroplasty, hybrid, in 2006, now desires the same for  the right for bone-on-bone arthritic changes that are affecting her  ability to ambulate and making household chores quite difficult, she  lives independently.  The risks and benefits of the surgery are well  known to the patient, questions were answered.   DESCRIPTION OF PROCEDURE:  The patient was identified by armband and  taken to the operating room at [facility] where the appropriate  anesthetic monitors were attached and general endotracheal anesthesia  induced with the patient in the supine position.  A Foley catheter was  inserted.  She received 2 grams of Ancef preoperatively and she was then  rolled into the left lateral decubitus position and fixed there with a  Veronda Prude II pelvic clamp.  The right lower extremity was prepped  and draped in the usual sterile fashion from the ankle to the  hemipelvis.  The skin along the lateral hip and  thigh infiltrated with  20 mL of 0.5% Marcaine and epinephrine solution.  Then, we began the  procedure by making a standard 20 cm incision centered over the greater  trochanter allowing a posterolateral approach to the hip joint.  Small  bleeders in the skin and subcutaneous tissue were identified and  cauterized.  The IT band was then cut in line with the skin incision  exposing the greater trochanter.  A Cobra retractor was placed between  the gluteus minimus and the superior hip joint capsule and between the  quadratus femoris and inferior joint capsule.  This exposed the  posterior aspect of the hip joint.  The piriformis and short external  rotators were then tagged with a #2 Ethibond suture and cut off their  insertion on the intertrochanteric crest and the posterior capsule was  developed into an acetabular based flap and, likewise, tagged with two  #2 Ethibond sutures.  The hip was then flexed and internally rotated  dislocating the femoral head.  The standard neck cut was performed one  fingerbreadth above the lesser trochanter.  The proximal femur was then  translated anterior levering off the anterior column with a Homan  retractor exposing the acetabulum.  A posterior inferior wing retractor  was placed at the junction of the ischium and the acetabulum, a spike  Cobra in the cotyloid notch, enhancing our exposure of the labrum which  was then resected. We then reamed up to a 49 mm basket reamer obtaining  good coverage in all quadrants and getting just down into the  subchondral bleeding bone.  The edges were then carefully cleaned with  rongeurs and we hammered into place a 50-mm DePuy ASR cup in 45 degrees  of adduction and about 20 degrees of anteversion.  Satisfied with the  position of the cup, which was well placed and well fixed, the hip was  flexed and internally rotated exposing the proximal femur.  We entered  with the initiating reamer followed by the lateral reamer  and broached  up to a #4 broach which had the same angle as the neck cut and did not  require any calcar milling. With the broach in place, an NK plus 0 45 mm  trial ball was placed on the broach, the hip reduced, and stability  noted to flexion of 90, internal rotation to 70, and in extension and  external rotation, the hip could not be dislocated. At this point, the  trial components were removed.  We sized for and inserted a #3 cement  restricter to the appropriate depth and then water picked the proximal  femur clean and dried with suction and sponges. A double batch of DePuy  low viscosity cement with 1500 mg Zinacef was then mixed, inserted in  the gun, and placed into the proximal femur under pressure followed by a  #4 DePuy Summit basic stem. Excess cement was removed and the stem was  held in 20 degrees of anteversion as it cured.  Once the stem had cured,  an NK plus 0 45 mm ultimate ball was hammered onto the stem.  The hip  was once again reduced and stability noted to be excellent.  The  acetabular capsular flap and short piriformis were repaired back to the  intertrochanteric crest through drill holes. The wound was water picked  clean one more time.  No significant bleeding was noted. The IT band was  closed with running #1 Vicryl suture, the subcutaneous tissue with 0 and  2-0 undyed Vicryl suture, and the skin with running interlocking 3-0  nylon suture.  A dressing of Xeroform and Mepilex was then applied.  The  patient was unclamped, rolled supine, awakened, and taken to the  recovery room without difficulty.      Feliberto Gottron. Turner Daniels, M.D.  Electronically Signed     FJR/MEDQ  D:  03/30/2007  T:  03/30/2007  Job:  [id number]

## 2010-07-15 NOTE — Assessment & Plan Note (Signed)
[facility]                            CARDIOLOGY OFFICE NOTE   NAME:[name], [name] NUON                    MRN:          [id number]  DATE:07/05/2007                            DOB:          1922/11/28    HISTORY OF PRESENT ILLNESS:  [name] returns today for follow-up.  She has  chronic atrial fibrillation.  Rate control has been good.  She is  asymptomatic from this.  Her INR was a little low today at 1.9.   She continues to have significant issues with her hips and knees.   She has had bilateral hip replacements and probably needs a left knee  replacement.   She has significant lower extremity edema which is likely due to  previous venous insufficiency and damage from her multiple orthopedic  surgeries.   We have escalated her dose of Lasix up to 20 b.i.d. and she still has  significant lower extremity edema.  I told her we would switch her to  Demadex 20 b.i.d. in hopes of getting a better result.  I will also give  her some Juzo 20-30 mmHg compression stockings for both legs,  particularly on the right side.   She will have a B-met and a BNP today to further assess her state of  hydration   I told her to follow up with Dr. Turner [name].  There is no absolute  contraindication to knee surgery, although, I did tell her that her  chronic lower extremity edema would be just that, and I do not foresee  her having resolution of this in the future.   REVIEW OF SYSTEMS:  Otherwise, negative.   MEDICATIONS:  1. Digoxin 0.25 a day.  2. Lasix to be changed to Demadex 20 b.i.d.  3. Potassium 20 a day.   PHYSICAL EXAMINATION:  GENERAL:  Remarkable for an elderly white female  who appears depressed.  VITAL SIGNS:  Weight is 196, blood pressure 128/72, pulse 67 and  irregular, respiratory 14.  HEENT:  Unremarkable.  Carotids normal without bruit, no  lymphadenopathy, thyromegaly JVP elevation.  LUNGS:  Clear with good diaphragmatic motion.  No wheezing.  HEART:  S1-S2, normal heart sounds, PMI normal.  ABDOMEN:  Protuberant.  Bowel sounds positive.  No bruit, no  hepatosplenomegaly or hepatojugular reflux.  No tenderness.  Distal  pulses are intact.  Right lower extremity has +3 edema, left lower  extremity has +2 edema.   IMPRESSION:  1. Atrial fibrillation,  rate control fine on digoxin alone.  Continue      anticoagulation with Coumadin.  2. Lower extremity edema.  Can change Demadex, continue potassium,      check B-met and BNP today.  3. Orthopedic issues.  Follow with Dr. Turner [name].  Okay to have knee      surgery.  Will have to follow lower extremity edema which will be      chronic and clearly will worsen at the time of surgery.  Her      Coumadin will have to be held and resumed in the perioperative.  I      will see her back  in about 3 months and we will reassess her      response to [facility]     [name]. [name] Emms, MD, [facility]  Electronically Signed    PCN/MedQ  DD: 07/05/2007  DT: 07/05/2007  Job #: [id number]

## 2010-07-15 NOTE — Assessment & Plan Note (Signed)
[facility]                            CARDIOLOGY OFFICE NOTE   NAME:[name], [name] BENTSEN                    MRN:          [id number]  DATE:03/21/2007                            DOB:          04-22-1922    [name] is a pleasant 77 year old patient referred by Dr. Turner Daniels  for preop clearance.  She needs a right hip surgery.   Dr. Turner Daniels has injected her right shoulder and then her left hip in 2006.  She has apparently had atrial fibrillation on an EKG, and was referred  here for further workup.   In talking to the patient this is likely chronic.  She reports having an  irregular heartbeat for over a year particularly when she had follow-up  at [facility] for her breast cancer.  In December they told her her heart beat  was irregular.   The patient has not had any significant chest pain, PND or orthopnea.  There have been no palpitations or syncope.   She has chronic lower extremity edema for over two years.   The patient has not had a previous cardiac workup.   Her coronary risk factors showed minimal.  There is no history of  diabetes.  She is not on cholesterol medicine.   FAMILY HISTORY:  Noncontributory.   She has not been hypertensive.   Her activity levels are markedly limited by her right hip pain.  She can  barely get around.  She also has limiting right upper shoulder pain.  She had a steroid shot in December, but clearly has significant issues  with range of motion in the right shoulder as well.  In regards to the  patient's atrial fibrillation there has been no TIA or CVA.  The patient  has been unaware of any irregularity to her heartbeat.  There has been  no presyncope or evidence of long pauses or need for a pacemaker.   The patient's lower extremity edema has not worsened.   I had a long discussion with [name] regarding the diagnosis of atrial  fibrillation.  We talked about rate control, anticoagulation,  antiarrhythmics and  conversion.  Since she is asymptomatic and probably  in chronic atrial fibrillation, I do not think there is any merit to  cardioverting her.  In particular there is no reason to do this prior to  the stress of surgery.  She is likely to go back into atrial  fibrillation in regards to her rate control.  We will increase her  Toprol to 50 b.i.d.   In regards to anticoagulation she will likely be on Coumadin post hip  surgery, and I would continue this postoperatively.  Then we will make a  decision about long-term Coumadin.  In talking to [name] she clearly is  unsteady on her feet now, but this is due to the right hip pain.  Prior  to this she has been very independent and likely would be a candidate  for long-term Coumadin.   She seems to understand the diagnosis now and some of the issues  involved.   REVIEW OF SYSTEMS:  Otherwise, remarkable for  right shoulder pain and  fairly profound right hip pain.  She seems to think her hip goes in and  out of its socket.   PAST MEDICAL HISTORY:  Otherwise, remarkable for some type of sarcoid  surgery back in 1959, previous broken ankle in 1985, breast cancer with  lumpectomy in 2007, left hip replacement in 2006 by Dr. Turner Daniels.   The patient is from [place].  She is single.  She is in town with her  brother and will be here until her surgery.  This has been rescheduled  for January 28.  She does not smoke or drink.  Activity is profoundly  limited by pain.   FAMILY HISTORY:  Remarkable for a mother dying at age 36 of a heart  attack.  Father dying at age 21 of an aneurysm of the aorta.   CURRENT MEDICATIONS:  1. Toprol 50 a day.  2. Os-Cal.  3. Vitamin C.  4. Folic acid.  5. Glucosamine.  6. An aspirin a day.   ALLERGIES:  She has no known allergies.   PHYSICAL EXAMINATION:  GENERAL:  Remarkable for an elderly white female  in some distress from her hip pain.  VITAL SIGNS:  Weight is 198, blood pressure 140/80, pulse 100 and   irregular, respiratory rate 16.  HEENT:  Unremarkable.  Carotids normal without bruit, no  lymphadenopathy, thyromegaly, JVP elevation.  LUNGS:  Clear, good diaphragmatic motion.  No wheezing.  S1, S2 with  normal heart sounds.  PMI normal.  ABDOMEN:  Benign.  Bowel sounds positive with AAA, no bruit, no  hepatosplenomegaly,  no hepatojugular reflux.  Distal pulses are intact,  +1 edema on the right, +2 in the left.  NEUROLOGICAL:  Nonfocal.  No muscular weakness.  SKIN:  Warm and dry.   She has marked decreased range of motion in the right shoulder as well  as the right hip.  EKG shows atrial fibrillation and nonspecific ST-T  wave changes.   IMPRESSION:  1. Preoperative clearance.  The patient is low risk in regards to a      coronary artery disease or heart failure.  She will increase her      Toprol to 50 b.i.d. for rate control, and make sure she takes her      beta blocker right up until the morning of surgery.  We would like      to be notified so we can follow her on telemetry perioperatively.  2. Atrial fibrillation in regards to anticoagulation.  I would like      the patient placed on Coumadin after her hip surgery.  She should      be discharged with a therapeutic INR, and we will follow her up in      our Coumadin clinic perioperatively.  3. Lower extremity edema.  Add hydrochlorothiazide 25 a day with      potassium 10 every other day.  This will decrease her risk of      perioperative thrombophlebitis, and make her rehabilitation easier      if she has less fluid on board.  4. Hypertension currently well controlled.  Continue Toprol.  Low dose      diuretic will also help.  5. Right hip and shoulder pain.  Follow up with Dr. Turner Daniels for pain      medications.  She will be cleared for surgery so long as her 2-D      echocardiogram is normal.  We will try to get  her 2-D      echocardiogram today.   That way there the patient will be able to proceed with hip surgery on   the 28th.   Addendum:  Echo shows normal LV fucntion with moderate LAE and mild MR.  Given her normal LV function and lack of cardiac symptoms she is cleared  for hip surgery.  Dr. Turner Daniels will call us to follow her on telemetry in  the hospital.     Theron Arista C. Eden Emms, MD, [facility]  Electronically Signed    PCN/MedQ  DD: 03/21/2007  DT: 03/21/2007  Job #: [id number]   cc:   Feliberto Gottron. Turner Daniels, M.D.

## 2010-07-15 NOTE — Discharge Summary (Signed)
[name], [name]             ACCOUNT NO.:  [id number]   MEDICAL RECORD NO.:  [id number]          PATIENT TYPE:  INP   LOCATION:  3703                         FACILITY:  [facility]   PHYSICIAN:  Feliberto Gottron. Turner Daniels, M.D.   DATE OF BIRTH:  Jul 08, 1922   DATE OF ADMISSION:  03/30/2007  DATE OF DISCHARGE:  04/06/2007                               DISCHARGE SUMMARY   DIAGNOSIS:  End-stage degenerative joint disease of the right hip.   SECONDARY DIAGNOSIS:  Atrial fibrillation.   PROCEDURE:  Right total hip arthroplasty.   HISTORY OF PRESENT ILLNESS:  The patient is an 77 year old woman who  underwent successful left total hip arthroplasty of a hybrid type in  2006, now has presented for the same for right bone on bone arthritic  changes that are effecting her ability to ambulate and making household  chores difficult.  She otherwise lives independent.  The risks and  benefits of the surgery were outlined to the patient, questions were  answered and all conservative measures had been exhausted.   ALLERGIES:  The patient has no known drug allergies.   MEDICATIONS:  At the time of admission:  1. Vicodin.  2. Klor-Con.  3. Toprol.  4. Hydrochlorothiazide.  5. Os-Cal.  6. Centrum Silver.  7. Vitamin C.  8. Folic acid.  9. Vitamin D.  10.Fish oil.  11.Omega 3.  12.Advil.  13.Bayer aspirin.  14.Glucosamine Chondroitin.   PAST MEDICAL HISTORY:  1. Usual childhood disease.  2. History of hypertension.  3. Degenerative joint disease.  4. Breast cancer.   PAST SURGICAL HISTORY:  Chest surgery, ankle ORIF, total hip  arthroplasty on the left, no difficulty with GET.   SOCIAL HISTORY:  No tobacco, no ethanol, no IV drug abuse.  She is  single, lives alone, is a retired Music therapist.   FAMILY HISTORY:  Mother died at age 6 with a history of diabetes and  MI.  Father died at 47 with a history of anemia.   REVIEW OF SYSTEMS:  Positive for glasses and swollen ankles.  No  dentures,  shortness of breath, chest pain or recent illness.   PHYSICAL EXAMINATION:  VITAL SIGNS:  At the time of admission the  patient's temperature is 97.4, pulse 61, blood pressure 131/74.  She is  5 feet, 9, 200 pound female.  HEENT:  Head is normocephalic, atraumatic.  Ears:  Tympanic membranes  clear. Eyes:  Pupils equal, round, reactive to light and accommodation.  Nose and throat:  Patent, benign.  NECK:  Supple, full range of motion.  CHEST:  Clear to auscultation and percussion.  HEART:  Has an irregular, irregular rhythm which she states she has  never had present before until she had an exam at [facility] several months prior to this admission.  EXTREMITIES:  Showed the left hip range of motion 0 to 90 degrees  forward flexion, 30 degrees of internal and external rotation.  Right  hip has forward flexion of only 95 degrees, internal and external  rotation of some 5 degrees with pain.  SKIN:  Shows well healed  normal surgical scar over the left hip.  Right  hip skin was intact without abnormality.   LABORATORY AND X-RAY DATA:  Showed right hip end-stage DJD of the  femoral head eroding into the acetabulum.  Preoperative labs including  CBC, CMET, chest x-ray, EKG, PT and PTT were within normal limits with  the exception of hemoglobin of 15.1, glucose of 58 with BUN of 34.   HOSPITAL COURSE:  Because of the patient's new finding of irregularity  on her heart rate, she was seen by Dr. Eden Emms for preoperative  clearance.  On the day of admission the patient was taken to the  operating room at [facility] where she underwent a right hip total  hip arthroplasty, hybrid type using DuPuy #4 Sonnet stem cemented with  11 mm tip, #3 cement restricter and a +0 to 45 mm Ultima ball with a 50  mm ASR cup.  Foley catheter was placed perioperatively.  The patient was  placed on perioperative antibiotics.  She was placed on postoperative  Coumadin prophylaxis with a target  INR of 2.0 to 3.0 per pharmacy  protocol with bridging Lovenox therapy until she became therapeutic.  Physical therapy was begun on the first postoperative day.  The patient  was placed on PCA Dilaudid pain pump for pain control.  On postoperative  day #1 the patient was awake and alert, positive nausea, no emesis.  Vital signs were stable, no fever.  Wound was clean and dry.  She was  neurovascularly intact.  Hemoglobin was 12.5, white blood cell count  9.5. INR was 1.1.  Oxygen saturation 100%.  Urine output 450.  She  continued with Toprol that had been prescribed.  On postoperative day #2  the patient was complaining of moderate pain, greater when ambulating.  Temperature max was 100.3 ranging to 98.5.  INR 1.5.  Hemoglobin 10.2.  Dressing was dry.  She was otherwise neurovascularly intact.  PCA was  discontinue.  The Foley catheter was discontinued.  Physical therapy was  continued.  On postoperative day #2 the patient began developing long  runs of atrial fibrillation and because of this she was seen by Dr.  Nevin Bloodgood at the request of Dr. Eden Emms, who was called because of the  previous cardiac clearance.  It was felt that it was important to  transfer to the telemetry floor which she was for further monitoring.  Diltiazem was initially orally and continued aggressive management of  her pain.  On postoperative day #3 the patient was without complaint.  Pulse rate was 120.  Hemoglobin was 10.6.  INR was 1.4.  Dressing was  dry.  Calf was soft.  Wound was clean and dry.  She was otherwise stable  orthopedically and was felt she was doing an adequate amount of  conversion with oral medications by the cardiologist.  It was  recommended that she consider a short rehab stay at a skilled nursing  facility so that was begun as well.  On postoperative day #4 the patient  was complaining of pain in her right hip when moving, otherwise doing  well.  She was afebrile.  Her hemoglobin was stable and  INR was 1.2.  She was continuing to be followed by cardiology and continued with her  physical therapy.  Skilled nursing facility bed was sought.  On  postoperative day #5 the patient was without complaint, moderate pain.  INR was 1.7. She was afebrile.  Wound was benign with decreasing edema.  Positive pain  to all ranges of motion of the right shoulder where she  had known DJD.  A new x-ray was taken to make sure there was no further  injury.  She continued awaiting a skilled nursing facility bed.  On  postoperative day #6 the patient was able to ambulate almost 60 feet in  the hallway, still needed assistance with transfers.  X-rays showed end-  stage degenerative joint disease of the shoulder, but no new changes.  She was afebrile, vital signs were stable and the wound was clean and  dry.  She was neurovascularly intact.  Urine output was steady.  Heart  rate was 78 on diltiazem daily.  She was otherwise stable and making  progress with physical therapy.  On postoperative day #7 the patient was  without complaints except for pain with ambulation.  She was afebrile,  vital signs were stable.  X-rays of total hip showed stable total right  hip arthroplasty.  INR was 2.0. Her wound was benign.  She had been  cleared by cardiology to go to a skilled nursing facility and was  transferred to [facility].      Laural Benes. Jannet Mantis.      Feliberto Gottron. Turner Daniels, M.D.  Electronically Signed    JBR/MEDQ  D:  04/24/2007  T:  04/25/2007  Job:  [id number]

## 2010-07-15 NOTE — Consult Note (Signed)
[name] NO.:  [id number]   MEDICAL RECORD NO.:  [id number]          PATIENT TYPE:  INP   LOCATION:  2628                         FACILITY:  [facility]   PHYSICIAN:  Gerrit Friends. Dietrich Pates, MD, FACCDATE OF BIRTH:  02-15-1923   DATE OF CONSULTATION:  09/08/2007  DATE OF DISCHARGE:                                 CONSULTATION   REFERRING PHYSICIAN:  Feliberto Gottron. Turner Daniels, MD.   PRIMARY CARE PHYSICIAN:  Lowella Bandy, MD.   PRIMARY CARDIOLOGIST:  Noralyn Pick. Eden Emms, MD, [facility].   HISTORY OF PRESENT ILLNESS:  An 77 year old woman admitted for total  knee replacement surgery and now found to have dyspnea.  [name]  is known to Dr. Eden Emms for a long history of atrial fibrillation with  adequate control of heart rate.  She has had chronic dyspnea on exertion  and presumed mild chronic congestive heart failure despite normal left  ventricular systolic function and no valvular heart disease.  She did  well up to until yesterday, the second postoperative day, but last night  noted increasing dyspnea.  A BNP level was obtained with a value of 333.  A metabolic profile shows mild hyponatremia with a sodium 132,  borderline potassium with a value of 3.6, and normal renal function.  She was given a dose of Demadex and now reports improved symptomatology.  Oxygen saturations were decreased prompting initiation of oxygen  therapy.  Her values are still intermittently been in the 80s.  They  improve when she uses her incentive spirometer.  There is no chest x-ray  nor EKG available at this time.  She has developed some anemia  postoperatively, but hemoglobin was 10.0 this morning.  Re-  anticoagulation is in progress.   PAST MEDICAL HISTORY:  Otherwise notable for carcinoma of the breast,  treated by lumpectomy in 2007.  She previously underwent a left hip  replacement in 2006 with Dr. Turner Daniels and a right hip replacement earlier  this year.  She has chronic back problems with a  remote laminectomy.   MEDICATIONS:  When last seen in the office included digoxin 0.25 mg  daily, Demadex 20 mg b.i.d., and potassium 20 mEq daily.  Unfortunately,  her admitting drug list is divergent from this.  On that she is reported  also taking Toprol-XL 75 mg or 150 mg daily as well as glucosamine and a  number of vitamins and other nutritional supplements.   ALLERGIES:  She reports allergy to DEMEROL and OXYCODONE.   SOCIAL HISTORY:  No use of tobacco products or alcohol; lives in  [place].   FAMILY HISTORY:  Mother died due to myocardial infarction at age 1.  Father died, also at age 50, due to aortic aneurysm.   REVIEW OF SYSTEMS:  Notable for urinary frequency, nocturia, following a  regular diet, need for corrective lenses for near vision, and insomnia  for which she uses no sleep aids.  She has diffuse arthritic discomfort.  All other systems reviewed and are negative.   PHYSICAL EXAMINATION:  GENERAL:  A pleasant woman with minimal  respiratory distress.  VITAL SIGNS:  The heart rate is 90 and irregular, respirations 20, and  O2 saturation in the 80s on nasal O2 at 2 liters but 97 on the nasal O2  at 5 liters.  Temperature 98.1 and blood pressure 105/60.  Input and  output shows a net negative of 1 liter.  HEENT:  Anicteric sclerae; normal lids and conjunctivae; normal oral  mucosa.  NECK:  Mild jugular venous distention; right carotid bruit.  ENDOCRINE:  No thyromegaly.  HEMATOPOIETIC:  No adenopathy.  LUNGS:  Bibasilar rales.  CARDIAC:  Irregular rhythm; normal first and second heart sounds;  minimal systolic murmur.  ABDOMEN:  Soft and nontender; no organomegaly.  EXTREMITIES:  Edema 1+; bandage over the right knee.  MUSCULOSKELETAL:  Pain with movement of the right upper extremity.  NEUROLOGIC:  Symmetric strength and tone; normal cranial nerves.   IMPRESSION:  [name] has a history of congestive heart failure  with preserved left ventricular  systolic function.  Over the past few  days, her net fluid balance is positive, but only a liter or two.  Serial weights have not been obtained.  Her clinical findings are  consistent with recurrent congestive heart failure.  We will continue  diuresis with intravenous furosemide.  Oxygen will be provided as  necessary to maintain oxygen saturation.  Myocardial infarction will be  ruled out.  A chest x-ray will be obtained as well as an echocardiogram.  We will be happy to follow this nice woman with you to control her  volume status, which appears to be only mildly out of balance at present  and probably will not prolong her hospitalization nor create significant  postoperative problems.      Gerrit Friends. Dietrich Pates, MD, [facility]  Electronically Signed     RMR/MEDQ  D:  09/08/2007  T:  09/09/2007  Job:  [id number]

## 2010-07-15 NOTE — Discharge Summary (Signed)
[name], [name]             ACCOUNT NO.:  [id number]   MEDICAL RECORD NO.:  [id number]          PATIENT TYPE:  INP   LOCATION:  3703                         FACILITY:  [facility]   PHYSICIAN:  Feliberto Gottron. Turner Daniels, M.D.   DATE OF BIRTH:  [birth year]   DATE OF ADMISSION:  03/30/2007  DATE OF DISCHARGE:  04/06/2007                               DISCHARGE SUMMARY   DIAGNOSIS:  End-stage degenerative joint disease, right hip.   PROCEDURE:  While in hospital, right total hip arthroplasty.   SECONDARY DIAGNOSIS:  Atrial fibrillation.   DISCHARGE SUMMARY:  The patient is an 77 year old woman status post left  total hip arthroplasty, hybrid type, in 2006. She now desires the same  for bone on bone arthritic changes of the right hip that are affecting  her ability to ambulate and making household chores quite difficult if  not possible, and she otherwise lives independently. The risks and  benefits of surgery are well known to the patient, questions were  answered, and cardiac clearance was obtained preoperatively.   The patient has no known drug allergies.   MEDICATIONS:  At time of admission:  1. Vicodin 1 to 2 q.6h. p.r.n. pain.  2. Klor-Con 10 mEq 1 every other day.  3. Toprol-XL 1 tablet twice daily.  4. Hydrochlorothiazide 25 mg p.o. daily.  5. Os-Cal 500 plus D twice daily.  6. Centrum Silver daily.  7. Vitamin C 500 daily.  8. Folic acid 400 mcg daily.  9. Vitamin D 1000 International Units daily.  10.Fish oil, Omega-3 one tablet daily.  11.Advil 2 tablets twice daily which she stopped prior to the surgery.  12.Bayer aspirin 81 mg daily.  13.Glucosamine chondroitin.   PAST MEDICAL HISTORY:  Usual childhood disease. Adult history:  1. Hypertension.  2. DJD.  3. Breast cancer.  4. Rather recent diagnosis of atrial fibrillation.   SURGICAL HISTORY:  1. Chest surgery.  2. Ankle ORIF.  3. Total hip arthroplasty, no difficulty with GET.   SOCIAL HISTORY:  No tobacco, no  ethanol, no IV drug use. She is single,  lives alone.   FAMILY HISTORY:  Mother died at age 68 with history of diabetes and  heart attack. Father died at age 78 with a history of anemia.   REVIEW OF SYSTEMS:  Positive for glasses and swollen ankles and  irregular heart beat. No dentures, shortness of breath, chest pain, or  recent illness.   PHYSICAL EXAMINATION:  VITAL SIGNS:  Temperature 97.4, pulse 61, blood  pressure 131/74. The patient is a 5 foot 9, 200-pound female.  HEAD:  Is normocephalic, atraumatic. EARS:  TMs are clear. EYES:  Pupils  are equal, round, and reactive to light and accommodation. NOSE:  Patent. THROAT:  Benign.  NECK:  Supple. Full range of motion.  CHEST:  Clear to auscultation and percussion.  HEART:  Irregularly irregular rate and rhythm. No murmurs heard.  ABDOMEN:  Soft, nontender.  EXTREMITIES:  Left hip range of motion 0 to 90 degrees forward flexion,  internal rotation 30 degrees, external rotation 30 degrees, right hip  forward flexion 85 degrees, internal/external  rotation of 5 degrees each  with pain. Foot tap is positive on the right, negative on the left. Left  hip shows well-healed normal scar. No abnormality of skin on the right.  Otherwise, neurovascularly intake.   X-rays show well fixed left total hip, on the right hip end-stage DJD  with femoral head eroding into the acetabulum. Preoperative labs  including CBC, CMET, chest x-ray, EKG, PT and PTT were within normal  limits.   HOSPITAL COURSE:  On the date of admission, the patient was taken to the  operating room at [facility] where she underwent a hybrid right  total hip arthroplasty using a DePuy #4 [place] stem cemented, 11-mm tip,  #3 cement restricter and a +0 45-mm Ultima ball and a 50-mm ASR cup.  Foley catheter was placed perioperatively. The patient was placed on  perioperative antibiotics. She was placed on postoperative Coumadin  prophylaxis with a target INR of 2 to  3. She was placed on bridging  Lovenox therapy until she became therapeutic. She was placed on Dilaudid  PCA for pain control, and physical therapy was begun the first  postoperative day. Postoperative day #1, the patient was awake and  alert, positive nausea. No emesis. No dizziness or shortness of breath.  Bowel sounds were stable. She was afebrile. Hemoglobin was 12.5, white  count 9.5, INR 1.1. She was otherwise stable and making slow progress  but steadily in physical therapy. On postoperative day #2, the patient  was complaining of moderate pain, certainly greater when ambulatory. T-  max 100.3, ranging to 98.5. Hemoglobin 10.2, INR 1.5. Dressing was dry.  Thigh was soft. PCA was discontinued. Foley was discontinued. On  postoperative day #2, the patient developed rapid heart rate with  symptoms, and [facility] saw the patient in consultation. She  was felt to be in atrial fibrillation with rapid ventricular response.  She was placed on telemetry bed for heart monitoring and continued  medical management. Postoperative day #3, the patient was without  complaint. T-max 100.5. Hemoglobin 10.6, INR 1.4. Dressing was dry.  Thigh was soft. She continued to make steady improvement with both  orthopedic and cardiology complaints but was making very slow progress  with occupational or physical therapy, and because of that, skilled  nursing bed was sought through social services. On postoperative day #4,  the patient was complaining of right hip pain when moving. Otherwise,  doing well orthopedically other than some right shoulder pain where she  has known arthritis. She was afebrile. INR was 1.2. Dressing was dry.  Wound was benign. Thigh edema continued to decrease. She continued  treatment for her atrial fibrillation through [place], and she continued  to await skilled nursing bed for rehabilitation. Postoperative day #5,  the patient was complaining of moderate pain in the right  hip with  increased pain in the right shoulder. She was afebrile. Wound was  benign. She had pain to all range of motion in the right shoulder, and x-  rays were obtained which showed stable arthritic change. No acute  abnormality. She continued to await skilled nursing bed and remained  stable with her cardiologic conditions. On postoperative day #6, the  patient was able to ambulate almost 60 feet with assistance but still  had poor transfers. Dressing was dry. Wound was benign. She was  afebrile. Urine output 450, heart rate 78. INR 1.9. Otherwise,  orthopedically stable. When seen by cardiology, they agree that she was  ready for rehabilitation bed  and would continue her medical management  after her transfer as needed. On postoperative day #7, the patient was  without complaint other than pain when ambulating. She was awake and  alert. She was afebrile. Wound was clean and dry. Thigh with decreased  edema. Pain to range of motion but x-rays the day before had revealed a  total stable hip. She was otherwise medically stable and orthopedically  improved. She was still not able to ambulate independently or transfer  independently and thus was improved to transfer to a nursing home bed  and had accepted a bed at [facility] for rehabilitation and  will be discharged there later today.   MEDICATIONS:  At the time of discharge:  1. Colace 100 mg b.i.d.  2. Coumadin per pharmacy protocol with a target INR of 2-3.  3. Potassium chloride, K-Dur 10 mEq tablet every other day.  4. Os-Cal plus D b.i.d.  5. Multivitamin p.o. daily.  6. Vitamin C 500 mg p.o. daily.  7. Vitamin D 400 mg p.o. daily, if the Os-Cal plus D is not available.  8. Omega-3 fish oil p.o. daily.  9. Folic acid 1 mg p.o. daily.  10.Toprol-XL 50 mg p.o. b.i.d., do not crush; hold if systolic blood      pressure is less than 100 or heart rate less than 70.  11.Tiazac Cardizem CD ER 240 mg p.o. daily.   12.Senokot or other laxative of choice as needed.  13.Reglan 10 mg p.o. q.8h. as needed for nausea.  14.Robaxin 500 mg p.o. q.6h. p.r.n. spasm.  15.Ambien 5 mg p.o. every night p.r.n. sleep.  16.Vicodin 1-2 q.4h. p.o. p.r.n. pain, the 5/325 dosage.   ACTIVITY:  At time of discharge, weight bearing as tolerated with  bilateral total hip precautions.   DIET:  As regular.   WOUND CARE:  Dressing change every other day or as needed. The patient  may shower seated with assistance.   Patient should return to clinic with Dr. Turner Daniels in one week's time.  Please call [phone] for appointment. She should return sooner should  she have any temperature greater than 101.5, any drainage from the  wound, or pain that is not well controlled by pain medication.   Once again, her diagnosis for this admission was end-stage degenerative  joint disease of the right hip.   PROCEDURE:  While in hospital, right total hip arthroplasty.      Laural Benes. Jannet Mantis.      Feliberto Gottron. Turner Daniels, M.D.  Electronically Signed    JBR/MEDQ  D:  04/06/2007  T:  04/06/2007  Job:  [id number]

## 2010-07-15 NOTE — Assessment & Plan Note (Signed)
[facility] HEALTHCARE                            CARDIOLOGY OFFICE NOTE   NAME:[name], [name]                    MRN:          [id number]  DATE:01/12/2008                            DOB:          1922/04/22    Mckaylin returns today for followup.  She has significant AFib, diastolic  heart failure, and lower extremity edema.  She has been doing well.  She  is recovering from her recent right knee surgery.  She has had both hips  replaced by Dr. Turner Daniels.  She probably needs her left knee done.  Unfortunately, her sister-in-law needs a knee replacement in January and  she helps [employer] quite a bit.  Malik is a little gun-shy, after having 2  major surgeries this year.  She will probably wait and reevaluate the  need for left knee surgery later next year.  She was at the Coumadin  Clinic today and INR was a little bit low at 1.6.  She has chronic AFib.  She is not having any significant palpitations.  Unfortunately, she has  not been taking her Lasix regularly and her lower extremity edema has  worsened.  She has chronic mild exertional dyspnea.  She is getting  along well with a 4-point cane.   REVIEW OF SYSTEMS:  Otherwise negative.   She is on:  1. Digoxin 0.25 a day.  2. Lasix is suppose to be 20 b.i.d. with potassium 10 b.i.d.  3. Lopressor 25 b.i.d.  4. Coumadin as directed.  5. Folic acid.  6. Fish oil.   PHYSICAL EXAMINATION:  GENERAL:  Remarkable for an elderly white female  in no distress.  VITAL SIGNS:  Her weight is 188, blood pressure is 120/70, pulse 76 and  regular, respiratory rate 14, and afebrile.  HEENT:  Unremarkable.  NECK:  Carotids are normal without bruit.  No lymphadenopathy,  thyromegaly, or JVP elevation.  LUNGS:  Clear.  Good diaphragmatic  motion.  No wheezing.  CARDIAC:  S1 and S2 with a soft murmur in systole.  PMI not palpable.  ABDOMEN:  Benign.  Bowel sounds positive.  No AAA.  No tenderness.  No  bruit.  No  hepatosplenomegaly.  No hepatojugular reflux.  EXTREMITIES:  Status post bilateral hip surgeries, status post right  knee replacement.  She has significant swelling in both lower  extremities at +3 with varicosities and a brace on her left knee.  NEURO:  Nonfocal.  SKIN:  Warm and dry.  Bilateral lower extremity weakness.   IMPRESSION:  1. Atrial fibrillation, stable.  Rate control is good.  INR little      low.  Follow up in the Coumadin Clinic.  Appropriate dose      adjustment made.  Continue Lopressor 25 b.i.d. for rate control.  2. Lower extremity edema, noncompliant with Lasix.  She will      reinstitute her Lasix 20 b.i.d. with potassium 10 b.i.d.  Elevate      legs at the end of the day.  3. Diastolic heart failure, currently stable.  Dyspnea at baseline.      Weight is in a  good range.  Continue low dose diuretic.   Overall, I think Aleida is doing well.  She will follow up with Dr. Turner Daniels  for orthopedic problems.  I will see her back in 6 months and she will  likely see the Coumadin Clinic in 4 weeks.     Noralyn Pick. Eden Emms, MD, [facility]  Electronically Signed    PCN/MedQ  DD: 01/12/2008  DT: 01/12/2008  Job #: [id number]

## 2010-07-15 NOTE — Assessment & Plan Note (Signed)
HEALTHCARE                            CARDIOLOGY OFFICE NOTE   NAME:[name], [name]                    MRN:          [id number]  DATE:04/28/2007                            DOB:          Jan 07, 1923    [name] returns today in follow-up.  She has chronic a fib,  lower  extremity edema and hypertension.  I cleared her for surgery at the end  of January.  She had right hip surgery by Dr. Turner Daniels.  Postoperative  course was fairly uncomplicated.  Prior to the surgery we had started  her on hydrochlorothiazide and potassium for lower extremity edema.  She  had stopped taking this at rehab.   She continues to have significant joint problems.  Her right shoulder  continues to be painful, now her right knee is hurting her.  She has  been able to ambulate with a walker but still has a long way to go.  Her  a fib has been asymptomatic.  She has not had palpitations, syncope,  PND, orthopnea.  She continues to have lower extremity edema now that  she has stopped her diuretic.   Review of systems otherwise negative.   MEDICATIONS:  Include Os-Cal, Centrum, vitamin C, folic acid, Toprol 50  b.i.d. Cardizem 240 a day, warfarin 6 mg Monday, Wednesday, Friday,  Saturday, 5 mg the other days, hydrochlorothiazide 25 a day, potassium  10 a day to be added back.   Her exam is remarkable for weight of 198, blood pressure 126/73, pulse  71 and irregular, respiratory rate 14, afebrile.  HEENT:  Unremarkable.  NECK:  Carotids normal, without bruit, no lymphadenopathy, thyromegaly,  JVP elevation.  LUNGS are clear with good diaphragmatic motion.  No  wheezing.  CARDIAC:  S1-S2 with normal heart sounds, PMI normal.  ABDOMEN:  Benign.  Bowel sounds positive.  No AAA, no tenderness, no  hepatosplenomegaly, no hepatojugular reflux.  EXTREMITIES:  Distal pulses are intact, +1-2 edema bilaterally.  PTs are  +1 bilaterally.  NEURO:  Nonfocal.  SKIN:  Warm and dry.  She has  significant right lower extremity weakness  with decreased range of motion in the right leg.   IMPRESSION:  1. A fib rate control good.  Continue current medications.  To see      Coumadin clinic today, follow-up q. 4 weeks as needed.  2. Lower extremity edema, hydrochlorothiazide, potassium being added      back.  This will help with the rehab.  I used the analogy with her      about using ankle weights in terms of making it more difficult for      her to move around.  She will elevate her legs at end of the day      and where surgical compression hose.  3. Hypertension currently well controlled.  Continue Cardizem and      Toprol which are helping with a rate control as well.  4. Continued orthopedic issues including right knee pain and right      shoulder pain.  Follow-up with Dr. Turner Daniels.  Unfortunately it appears  at some point in the future she may now need a right knee      replacement.   Continue rehab and home health.     Noralyn Pick. Eden Emms, MD, [facility]  Electronically Signed    PCN/MedQ  DD: 04/28/2007  DT: 04/28/2007  Job #: [id number]

## 2010-07-18 NOTE — Op Note (Signed)
NAMEANNSLEIGH, [name] NO.:  [id number]   MEDICAL RECORD NO.:  [id number]          PATIENT TYPE:  INP   LOCATION:  5022                         FACILITY:  [facility]   PHYSICIAN:  Feliberto Gottron. Turner Daniels, M.D.   DATE OF BIRTH:  June 23, 1922   DATE OF PROCEDURE:  10/20/2004  DATE OF DISCHARGE:                                 OPERATIVE REPORT   PREOPERATIVE DIAGNOSIS:  End-stage arthritis of the left hip.   POSTOPERATIVE DIAGNOSIS:  Same.   PROCEDURE:  Left total hip arthroplasty using DePuy components, a 52 ASR  shell, NK +0 46 ASR ball,  #4 Summit stem.  Stem cemented with a double  batch of DePuy medium viscosity cement.  11 mm distal tip on the stem and a  #3 cement restricter.   SURGEON:  Feliberto Gottron. Turner Daniels, M.D.   ASSISTANT:  Skip Mayer, PA-C.   ANESTHETIC:  Endotracheal.   ESTIMATED BLOOD LOSS:  300 mL.   FLUID REPLACEMENT:  1200 mL crystalloid.   DRAINS PLACED:  None.   TOURNIQUET TIME:  None.   INDICATIONS FOR PROCEDURE:  77 year old woman with x-ray proven end-stage  arthritis of the left hip who has progressed to the point where she actually  has difficulty walking despite use of narcotic medication. She has also  failed conservative treatment with exercise and because of functional  limitations and pain, desires elective left total hip arthroplasty. Risks  and benefits of surgery were discussed with the patient at length, also with  her brother Max Elias, who is a [employer] fast streamer and all questions were  answered.   DESCRIPTION OF PROCEDURE:  The patient identified by armband, taken the  operating room at [facility] main hospital where appropriate anesthetic monitors  were attached and general endotracheal anesthesia induced with the patient  in supine position. She was then placed in the right lateral decubitus  position and fixed with a Stulberg Mark II pelvic clamp with the pelvis  vertical.  The left lower extremity was then prepped and draped in usual  sterile fashion from the ankle to the hemipelvis and the skin along the  lateral hip and thigh infiltrated with 20 mL of half percent Marcaine and  epinephrine solution. At this point a standard posterolateral approach to  the hip 20 cm in length centered over the greater trochanter was made  through the skin and subcutaneous tissue. Because of her adipose layer we  had used some Adson-Beckman retractors to get down to the IT band which was  cut in line with the skin incision exposing the greater trochanter. The  piriformis and short external rotators were then tagged to number with #2  Ethilon suture and cut off at their insertion on the intertrochanteric crest  exposing the hip joint capsule from posterior-superior to posterior  inferior. An acetabular based flap in this tissue was then made and this was  likewise tagged with two #2 Ethibond suture and retracted posteriorly also  protecting the sciatic nerve. This exposed the femoral neck and the  posterior aspect of the arthritic femoral head.  The hip was flexed and  internally  rotated dislocating the femoral head and a standard neck cut was  performed one fingerbreadth above the lesser trochanter with a power saw.  Examination of the femoral head revealed it to be down to bone-on-bone  arthritis, some fairly impressive subchondral cysts same with the  acetabulum.  Posterior superior and posterior inferior wing retractors were  then placed.  The proximal femur was levered off the anterior column  translating it anterior to the acetabulum and a spike cobra retractor was  placed in the inferior aspect of the cotyloid notch.  The inflamed labrum  was then removed with the electrocautery exposing the acetabulum which was  then sequentially reamed up to 51 mm basket reamer obtaining a good cut in  all quadrants and peripheral osteophytes were removed.  At this point a 52  mm ASR shell was brought up and impacted into the position of 45  degrees of  abduction and 20 degrees of anteversion obtaining good coverage in all  quadrants and exposing the posterior superior beads over about 2 mm area. It  had excellent fit and fill and there was no loosening noted. At this point  the hip was flexed and internally rotated exposing the proximal femur which  was then entered with a step drill initiator followed by broaching up to a  #4 cemented Summit broach and calcar milling accomplished to finish up the  femoral preparation. An NK +0 46 mm trial was then placed on the broach, the  hip reduced and the hip noted to flex to 90 degrees, internally rotated to  60 without difficulty, externally rotated to 30 and there was no instability  noted. At this point the trial components removed. We sized the proximal  femur for a #3 cement restricter which was inserted to the proper depth and  the proximal femur was then water picked clean and dried with suction and  sponges, a double batch of DePuy medium viscosity cement with 1500  milligrams of Zinacef was then mixed and the back table and injected under  pressure into the proximal femur followed by a #4 Summit stem with a 11 mm  tip. Excess cement was removed and the cement allowed to cure. Once this had  been accomplished an NK+0 collar was then hammered on the stem followed by  the 46 ball. The hip again reduced and stability noted to be excellent. The  wound was once again irrigated out with normal saline solution. The capsule  and short external rotators were repaired back to the intertrochanteric  crest a #2 Ethibond suture. At this point the wound was once again irrigated  out externally the IT band was closed with running #1 Vicryl suture. The  subcutaneous tissue layered with 0 and 2-0 undyed Vicryl suture and the skin  with running interlocking 3-0 nylon suture. A dressing of Xeroform, 4x4  dressing sponges and Hypafix tape was applied. The patient was then laid supine, awakened and  taken to the recovery room without difficulty.      Feliberto Gottron. Turner Daniels, M.D.  Electronically Signed     FJR/MEDQ  D:  10/20/2004  T:  10/20/2004  Job:  [id number]

## 2010-07-18 NOTE — Discharge Summary (Signed)
[name], [name] NO.:  [id number]   MEDICAL RECORD NO.:  [id number]          PATIENT TYPE:  ORB   LOCATION:  4530                         FACILITY:  [facility]   PHYSICIAN:  Ranelle Oyster, M.D.DATE OF BIRTH:  [birth year]   DATE OF ADMISSION:  10/23/2004  DATE OF DISCHARGE:  11/04/2004                                 DISCHARGE SUMMARY   DISCHARGE DIAGNOSES:  1.  Left total hip arthroplasty secondary to osteoarthritis, October 20, 2004.  2.  Pain management.  3.  Coumadin for deep vein thrombosis prophylaxis.  4.  Hypertension.  5.  History of breast cancer.  6.  Postoperative anemia after total hip replacement.   HPI:  This is a 77 year old female, history of a left leg fracture in 1990  with pinning, admitted August 21 with end-stage changes to left hip, no  relief with conservative care.  Underwent a left total hip arthroplasty  August 21 per Dr. Turner Daniels.  Placed on Coumadin for deep vein thrombosis  prophylaxis, weightbearing as tolerated.  Hospital course unremarkable.  Foley catheter tube removed August 23.  Right shoulder discomfort with x-ray  showing severe glenohumeral osteoarthritis and monitor with conservative  care.  Postoperative anemia, 8.0.  She denied any headache or dizziness.  She was admitted to subacute care services.   PAST MEDICAL HISTORY:  See discharge diagnoses.  Occasional alcohol, no  tobacco.   ALLERGIES:  DEMEROL.   SOCIAL HISTORY:  [place]  Independent with  cane prior to admission.  She plans to stay with her brother on discharge in  [place] and assistance as needed.   MEDICATIONS PRIOR TO ADMISSION:  Arimidex 1 mg daily, Toprol XL 50 mg daily,  multivitamin daily, aspirin 325 mg daily.   HOSPITAL COURSE:  Patient with progressive gains while on rehab services  with therapies initiated daily.  The following issues are followed during  the patient's rehab course.  Pertaining to Ms.  Jowers's left total hip  arthroplasty, October 20, 2004, surgical site healing nicely.  No signs of  infection.  Sutures have been removed.  She is weightbearing as tolerated  with total hip precautions.  Postoperative anemia of 7.9.  She was  transfused 2 units of packed red blood cells August 25 with followup  hemoglobins of 9.0, 8.4.  She remained on iron supplement.  She remained on  oxycodone as needed for pain.  Coumadin for deep vein thrombosis prophylaxis  with latest INR of 2.2.  She will complete Coumadin protocol November 20, 2004, followed by [facility].  She was advised to resume her aspirin  therapy after Coumadin completed.  Blood pressures controlled with Toprol 50  mg daily.  She had no headache or dizziness.  No bowel or bladder  disturbances.  Overall, for her functional mobility, she was minimal assist  with a rolling walker 150 feet, simple setup for activities of daily living  except minimal assist for lower body dressing.  She was discharged to home  with home therapies.   Discharge medications at time of  dictation included Coumadin 5 mg one and a  half tablets daily until November 20, 2004 and stop.  Arimidex 1 mg at  bedtime, Toprol XL 50 mg daily, Trinsicon twice daily, folic acid 1 mg  daily,  multivitamin daily, oxycodone as needed for pain, Robaxin as needed for  spasms.  She would return to Dr. Turner Daniels orthopedic services, call for  appointment.  [facility] to check INR on November 05, 2004.  She was  advised to resume her aspirin therapy after Coumadin completed.      Mariam Dollar, P.A.      Ranelle Oyster, M.D.  Electronically Signed    DA/MEDQ  D:  11/04/2004  T:  11/04/2004  Job:  [id number]   cc:   Ranelle Oyster, M.D.  [address] [place]  [place] [address]  Fax: [phone]   Feliberto Gottron. Turner Daniels, M.D.  [address]  [place]  [place] [address]  Fax: [phone]

## 2010-07-18 NOTE — Discharge Summary (Signed)
NAMEGIAH, [name]             ACCOUNT NO.:  [id number]   MEDICAL RECORD NO.:  [id number]          PATIENT TYPE:  INP   LOCATION:  5022                         FACILITY:  [facility]   PHYSICIAN:  [name] B. Su Hilt, P.A.  DATE OF BIRTH:  Apr 12, 1922   DATE OF ADMISSION:  10/20/2004  DATE OF DISCHARGE:  10/23/2004                                 DISCHARGE SUMMARY   PRIMARY DIAGNOSIS:  End-stage DJD of the left hip.   PROCEDURES:  Left total hip arthroplasty.   DISCHARGE SUMMARY:  The patient is a 77 year old woman with x-ray proven end-  stage arthritis of the left hip who has progressed to the point where she  actually has difficulty walking despite using narcotic pain medication.  Has  failed conservative treatment with exercise and physical limitations of  pain.  Desires elective left total arthroplasty of the hip.  Risks and  benefits of the surgery were discussed at length with the patient.  All  questions were answered.   ALLERGIES:  DEMEROL.   MEDICATIONS:  1.  Arimidex.  2.  Toprol.  3.  Aleve.  4.  Aspirin.  5.  Vitamins.   PAST MEDICAL HISTORY:  1.  __________  adult history.  2.  Hypertension.  3.  Degenerative joint disease.  4.  Breast cancer.   PAST SURGICAL HISTORY:  1.  Sarcoid left leg fracture in 1990.  2.  Breast cancer 2002.  No difficulties with __________  .   SOCIAL HISTORY:  No tobacco.  Positive ethanol, rarely.  No IV drug abuse.  She is retired Facilities manager.  Lives alone.   FAMILY HISTORY:  Father died at 15 from an aneurysm.  Mother died at 36 of  diabetes and MI.   REVIEW OF SYSTEMS:  __________ .  She denies any chest pain or shortness of  breath or recent illness.   PHYSICAL EXAMINATION:  VITAL SIGNS:  Temperature 97.9, pulse 64,  respirations 18, blood pressure 130/60, 5 feet 9 inches, weight 194 pounds.  HEENT:  Normocephalic, atraumatic.  EARS:  TMs are clear.  EYES:  Pupils  equal, round and reactive to light and accommodations.  __________ .  NECK:  Supple range of motion.  CHEST:  Clear to auscultation and percussion.  HEART:  Regular rate and rhythm.  No murmurs, rubs or gallops.  ABDOMEN:  Soft, nontender.  No masses.  Bowel sounds 2+.  EXTREMITIES:  Within normal limits with the exception of left hip, which has  range of motion internal rotation 5 degrees, external rotation 5 degrees,  positive for pain.  One quarter inch shortened left lower extremity compared  to the right.  She is otherwise neurovascularly intact, __________  positive.   STUDIES:  X-rays show end-stage arthritis of the left hip.   LABORATORY DATA:  Preoperative labs including CBC, CMET, chest x-ray, EKG,  PT/PTT were all within normal limits with the exception of the EKG which did  show nonspecific T wave abnormalities.  She also had a  BUN of 25.  Other  preoperative labs were within normal limits.   HOSPITAL COURSE:  On the day of admission, the patient was taken to the  operating room in the hospital where she underwent a left total hip  arthroplasty __________ .  A 52 ASR shell and NK plus 046 ASR ball and #4  cement stem.  Stem submitted with double __________ , medium viscosity  cement with an 11 mm distal tip on the stem and a #3 cement restricter.  The  patient was placed on preoperative antibiotics.  She was placed on  postoperative Phenergan prophylaxis with a target INR of 1.5-2.  She was  placed on postoperative PCA pain control with morphine.  Physical therapy  was begun the first postoperative day.   Postoperative day #1, the patient reported moderate pain.  She was awake and  alert.  Otherwise, doing well.  T-max 102.4, recheck 101.2; O2 saturations  100% using incentive spirometer.  Abdomen was soft and nontender.  She was  neurovascularly intact.  X-rays showed well placed, well fixed total hip.  Urine output greater than 350 cc per shift.  PT 15.3 on Lovenox and  Coumadin.  Dressing is dry.  Otherwise, progressing  well.   On postoperative day #2, the patient complained of moderate pain in the left  hip and thigh and also in the right shoulder where she had no arthritis.  She was afebrile.  Blood pressure 108/59, hemoglobin 8.3, INR 1.3.  Pain  range of motion in the right shoulder.  Dressing was dry.  Showed moderate  edema.  Gap was soft.  X-rays were taken of the shoulder to determine if  there was any acute damage.  Physical therapy was continued.  Foley was  discontinued.  Her Toprol was held because of her low blood pressure.  She  was evaluated for possible admission to rehab or [facility], and it was felt that  the patient would do well with an inpatient rehabilitation staff.   Postoperative day #3, the patient was awake and alert with moderate nausea.  Vital signs were stable.  She is afebrile.  Dressing was clean and dry.  Wound was benign.  Hemoglobin 8.0, PT 15.6, gap and bili were soft.  Her  shoulder was improving.  They did show arthritis and possible impingement on  the right shoulder, but she wishes to hold off on cortisone shot at this  point.  She was okay for rehab or SACU, and a subacute bed was available, so  she was discharged to their care.  She will continue using total hip  precautions for ambulation.  Weightbearing as tolerated with total hip  precautions and walker.   DISCHARGE MEDICATIONS:  1.  Preoperative medications.  2.  Tylox for pain.  3.  Coumadin for pharmacy protocol.   FOLLOWUP:  She will return to see [place] within one week of her discharge from  the [facility].  We will continue to follow her while in [facility] stay as needed.      Laural Benes. Judithe Modest  D:  12/15/2004  T:  12/15/2004  Job:  [id number]

## 2010-07-25 ENCOUNTER — Ambulatory Visit (INDEPENDENT_AMBULATORY_CARE_PROVIDER_SITE_OTHER): Payer: Medicare Other | Admitting: *Deleted

## 2010-07-25 DIAGNOSIS — I4891 Unspecified atrial fibrillation: Secondary | ICD-10-CM

## 2010-07-25 LAB — POCT INR: INR: 1.6

## 2010-08-08 ENCOUNTER — Ambulatory Visit (INDEPENDENT_AMBULATORY_CARE_PROVIDER_SITE_OTHER): Payer: Medicare Other | Admitting: *Deleted

## 2010-08-08 DIAGNOSIS — Z7901 Long term (current) use of anticoagulants: Secondary | ICD-10-CM | POA: Insufficient documentation

## 2010-08-08 DIAGNOSIS — I4891 Unspecified atrial fibrillation: Secondary | ICD-10-CM

## 2010-08-08 LAB — POCT INR: INR: 3.4

## 2010-08-11 ENCOUNTER — Encounter: Payer: Medicare Other | Admitting: *Deleted

## 2010-09-05 ENCOUNTER — Ambulatory Visit (INDEPENDENT_AMBULATORY_CARE_PROVIDER_SITE_OTHER): Payer: Medicare Other | Admitting: *Deleted

## 2010-09-05 DIAGNOSIS — I4891 Unspecified atrial fibrillation: Secondary | ICD-10-CM

## 2010-09-05 LAB — POCT INR: INR: 2.5

## 2010-09-16 ENCOUNTER — Other Ambulatory Visit: Payer: Self-pay | Admitting: *Deleted

## 2010-09-16 MED ORDER — WARFARIN SODIUM 6 MG PO TABS: ORAL_TABLET | ORAL | Status: DC

## 2010-10-10 ENCOUNTER — Encounter: Payer: Medicare Other | Admitting: *Deleted

## 2010-10-17 ENCOUNTER — Other Ambulatory Visit: Payer: Self-pay | Admitting: *Deleted

## 2010-10-17 ENCOUNTER — Ambulatory Visit (INDEPENDENT_AMBULATORY_CARE_PROVIDER_SITE_OTHER): Payer: Medicare Other | Admitting: *Deleted

## 2010-10-17 DIAGNOSIS — I4891 Unspecified atrial fibrillation: Secondary | ICD-10-CM

## 2010-10-17 LAB — POCT INR: INR: 2.7

## 2010-10-17 MED ORDER — METOPROLOL SUCCINATE ER 50 MG PO TB24: ORAL_TABLET | ORAL | Status: DC

## 2010-10-17 NOTE — Telephone Encounter (Signed)
rx sent in today

## 2010-11-14 ENCOUNTER — Encounter: Payer: Medicare Other | Admitting: *Deleted

## 2010-11-14 ENCOUNTER — Ambulatory Visit (INDEPENDENT_AMBULATORY_CARE_PROVIDER_SITE_OTHER): Payer: Medicare Other | Admitting: *Deleted

## 2010-11-14 DIAGNOSIS — I4891 Unspecified atrial fibrillation: Secondary | ICD-10-CM

## 2010-11-14 LAB — POCT INR: INR: 3.2

## 2010-11-20 LAB — DIFFERENTIAL
Basophils Absolute: 0
Basophils Relative: 1
Eosinophils Absolute: 0.2
Eosinophils Relative: 2
Lymphocytes Relative: 23
Lymphs Abs: 1.9
Monocytes Absolute: 0.8
Monocytes Relative: 10
Neutro Abs: 5.2
Neutrophils Relative %: 64

## 2010-11-20 LAB — CBC
HCT: 29.6 — ABNORMAL LOW
HCT: 31.4 — ABNORMAL LOW
HCT: 37.1
HCT: 45.6
Hemoglobin: 10.2 — ABNORMAL LOW
Hemoglobin: 10.6 — ABNORMAL LOW
Hemoglobin: 12.5
Hemoglobin: 15.1 — ABNORMAL HIGH
MCHC: 33.1
MCHC: 33.6
MCHC: 33.9
MCHC: 34.5
MCV: 91.2
MCV: 91.3
MCV: 92.4
MCV: 92.6
Platelets: 128 — ABNORMAL LOW
Platelets: 158
Platelets: 167
Platelets: 238
RBC: 3.24 — ABNORMAL LOW
RBC: 3.44 — ABNORMAL LOW
RBC: 4.02
RBC: 4.92
RDW: 14.1
RDW: 14.1
RDW: 14.2
RDW: 14.4
WBC: 10.5
WBC: 16.4 — ABNORMAL HIGH
WBC: 8.1
WBC: 9.5

## 2010-11-20 LAB — URINALYSIS, ROUTINE W REFLEX MICROSCOPIC
Bilirubin Urine: NEGATIVE
Glucose, UA: NEGATIVE
Hgb urine dipstick: NEGATIVE
Ketones, ur: NEGATIVE
Nitrite: NEGATIVE
Protein, ur: NEGATIVE
Specific Gravity, Urine: 1.023
Urobilinogen, UA: 1
pH: 5

## 2010-11-20 LAB — ABO/RH: ABO/RH(D): AB POS

## 2010-11-20 LAB — BASIC METABOLIC PANEL
BUN: 15
BUN: 22
CO2: 26
CO2: 32
Calcium: 8.5
Calcium: 8.6
Chloride: 102
Chloride: 98
Creatinine, Ser: 0.74
Creatinine, Ser: 0.85
GFR calc Af Amer: >60
GFR calc Af Amer: >60
GFR calc non Af Amer: >60
GFR calc non Af Amer: >60
Glucose, Bld: 116 — ABNORMAL HIGH
Glucose, Bld: 121 — ABNORMAL HIGH
Potassium: 4.1
Potassium: 4.5
Sodium: 130 — ABNORMAL LOW
Sodium: 137

## 2010-11-20 LAB — COMPREHENSIVE METABOLIC PANEL
ALT: 18
AST: 25
Albumin: 3.8
Alkaline Phosphatase: 82
BUN: 34 — ABNORMAL HIGH
CO2: 29
Calcium: 9.9
Chloride: 103
Creatinine, Ser: 1.09
GFR calc Af Amer: 58 — ABNORMAL LOW
GFR calc non Af Amer: 48 — ABNORMAL LOW
Glucose, Bld: 58 — ABNORMAL LOW
Potassium: 4.6
Sodium: 139
Total Bilirubin: 0.8
Total Protein: 6.9

## 2010-11-20 LAB — APTT: aPTT: 25

## 2010-11-20 LAB — PROTIME-INR
INR: 0.9
INR: 1.1
INR: 1.4
INR: 1.5
Prothrombin Time: 12.8
Prothrombin Time: 14.1
Prothrombin Time: 17.5 — ABNORMAL HIGH
Prothrombin Time: 18.2 — ABNORMAL HIGH

## 2010-11-20 LAB — URINE MICROSCOPIC-ADD ON

## 2010-11-20 LAB — B-NATRIURETIC PEPTIDE (CONVERTED LAB): Pro B Natriuretic peptide (BNP): 414 — ABNORMAL HIGH

## 2010-11-20 LAB — TYPE AND SCREEN
ABO/RH(D): AB POS
Antibody Screen: NEGATIVE

## 2010-11-21 ENCOUNTER — Encounter: Payer: Medicare Other | Admitting: *Deleted

## 2010-11-21 LAB — PROTIME-INR
INR: 1.2
INR: 1.7 — ABNORMAL HIGH
INR: 1.9 — ABNORMAL HIGH
INR: 2 — ABNORMAL HIGH
Prothrombin Time: 15.9 — ABNORMAL HIGH
Prothrombin Time: 20.9 — ABNORMAL HIGH
Prothrombin Time: 22.8 — ABNORMAL HIGH
Prothrombin Time: 23.5 — ABNORMAL HIGH

## 2010-11-26 ENCOUNTER — Other Ambulatory Visit: Payer: Self-pay | Admitting: *Deleted

## 2010-11-26 MED ORDER — TORSEMIDE 20 MG PO TABS: 20.0000 mg | ORAL_TABLET | Freq: Two times a day (BID) | ORAL | Status: DC

## 2010-11-27 LAB — PROTIME-INR
INR: 1.1
INR: 1.2
INR: 1.3
INR: 1.4
INR: 1.4
INR: 1.5
INR: 1.6 — ABNORMAL HIGH
INR: 1.8 — ABNORMAL HIGH
INR: 2.1 — ABNORMAL HIGH
INR: 2.2 — ABNORMAL HIGH
Prothrombin Time: 14.7
Prothrombin Time: 15.8 — ABNORMAL HIGH
Prothrombin Time: 16.6 — ABNORMAL HIGH
Prothrombin Time: 17.3 — ABNORMAL HIGH
Prothrombin Time: 17.7 — ABNORMAL HIGH
Prothrombin Time: 18.8 — ABNORMAL HIGH
Prothrombin Time: 20 — ABNORMAL HIGH
Prothrombin Time: 21.6 — ABNORMAL HIGH
Prothrombin Time: 25 — ABNORMAL HIGH
Prothrombin Time: 25 — ABNORMAL HIGH

## 2010-11-27 LAB — BASIC METABOLIC PANEL
BUN: 11
BUN: 12
BUN: 12
BUN: 13
BUN: 14
BUN: 16
CO2: 26
CO2: 28
CO2: 29
CO2: 29
CO2: 33 — ABNORMAL HIGH
CO2: 34 — ABNORMAL HIGH
Calcium: 7.9 — ABNORMAL LOW
Calcium: 8.2 — ABNORMAL LOW
Calcium: 8.2 — ABNORMAL LOW
Calcium: 8.2 — ABNORMAL LOW
Calcium: 8.4
Calcium: 8.5
Chloride: 100
Chloride: 102
Chloride: 96
Chloride: 97
Chloride: 98
Chloride: 98
Creatinine, Ser: 0.64
Creatinine, Ser: 0.64
Creatinine, Ser: 0.67
Creatinine, Ser: 0.67
Creatinine, Ser: 0.75
Creatinine, Ser: 0.77
GFR calc Af Amer: >60
GFR calc Af Amer: >60
GFR calc Af Amer: >60
GFR calc Af Amer: >60
GFR calc Af Amer: >60
GFR calc Af Amer: >60
GFR calc non Af Amer: >60
GFR calc non Af Amer: >60
GFR calc non Af Amer: >60
GFR calc non Af Amer: >60
GFR calc non Af Amer: >60
GFR calc non Af Amer: >60
Glucose, Bld: 101 — ABNORMAL HIGH
Glucose, Bld: 101 — ABNORMAL HIGH
Glucose, Bld: 92
Glucose, Bld: 93
Glucose, Bld: 96
Glucose, Bld: 99
Potassium: 3.6
Potassium: 3.8
Potassium: 3.8
Potassium: 3.9
Potassium: 4
Potassium: 4.6
Sodium: 131 — ABNORMAL LOW
Sodium: 132 — ABNORMAL LOW
Sodium: 135
Sodium: 137
Sodium: 137
Sodium: 140

## 2010-11-27 LAB — CBC
HCT: 28.2 — ABNORMAL LOW
HCT: 28.6 — ABNORMAL LOW
HCT: 29 — ABNORMAL LOW
HCT: 29.4 — ABNORMAL LOW
HCT: 29.4 — ABNORMAL LOW
HCT: 30.4 — ABNORMAL LOW
HCT: 34.7 — ABNORMAL LOW
HCT: 42.5
Hemoglobin: 10 — ABNORMAL LOW
Hemoglobin: 10 — ABNORMAL LOW
Hemoglobin: 10.1 — ABNORMAL LOW
Hemoglobin: 10.2 — ABNORMAL LOW
Hemoglobin: 10.5 — ABNORMAL LOW
Hemoglobin: 11.7 — ABNORMAL LOW
Hemoglobin: 14.4
Hemoglobin: 9.6 — ABNORMAL LOW
MCHC: 33.7
MCHC: 33.8
MCHC: 34
MCHC: 34.2
MCHC: 34.5
MCHC: 34.6
MCHC: 34.7
MCHC: 35
MCV: 91.9
MCV: 92.6
MCV: 92.8
MCV: 92.9
MCV: 92.9
MCV: 93.1
MCV: 93.2
MCV: 93.3
Platelets: 128 — ABNORMAL LOW
Platelets: 133 — ABNORMAL LOW
Platelets: 144 — ABNORMAL LOW
Platelets: 147 — ABNORMAL LOW
Platelets: 187
Platelets: 235
Platelets: 252
Platelets: 306
RBC: 3.02 — ABNORMAL LOW
RBC: 3.11 — ABNORMAL LOW
RBC: 3.12 — ABNORMAL LOW
RBC: 3.15 — ABNORMAL LOW
RBC: 3.16 — ABNORMAL LOW
RBC: 3.27 — ABNORMAL LOW
RBC: 3.74 — ABNORMAL LOW
RBC: 4.58
RDW: 14.7
RDW: 14.8
RDW: 14.8
RDW: 14.9
RDW: 15
RDW: 15.1
RDW: 15.3
RDW: 15.5
WBC: 10.4
WBC: 10.6 — ABNORMAL HIGH
WBC: 12.8 — ABNORMAL HIGH
WBC: 7.9
WBC: 8.3
WBC: 8.9
WBC: 9.5
WBC: 9.7

## 2010-11-27 LAB — TROPONIN I
Troponin I: 0.02
Troponin I: 0.03

## 2010-11-27 LAB — URINALYSIS, ROUTINE W REFLEX MICROSCOPIC
Bilirubin Urine: NEGATIVE
Glucose, UA: NEGATIVE
Hgb urine dipstick: NEGATIVE
Ketones, ur: NEGATIVE
Nitrite: NEGATIVE
Protein, ur: NEGATIVE
Specific Gravity, Urine: 1.02
Urobilinogen, UA: 1
pH: 8

## 2010-11-27 LAB — DIFFERENTIAL
Basophils Absolute: 0
Basophils Relative: 0
Eosinophils Absolute: 0.2
Eosinophils Relative: 2
Lymphocytes Relative: 20
Lymphs Abs: 1.7
Monocytes Absolute: 0.9
Monocytes Relative: 10
Neutro Abs: 6.1
Neutrophils Relative %: 69

## 2010-11-27 LAB — COMPREHENSIVE METABOLIC PANEL
ALT: 19
AST: 27
Albumin: 3.6
Alkaline Phosphatase: 74
BUN: 18
CO2: 33 — ABNORMAL HIGH
Calcium: 9.4
Chloride: 102
Creatinine, Ser: 1.07
GFR calc Af Amer: 59 — ABNORMAL LOW
GFR calc non Af Amer: 49 — ABNORMAL LOW
Glucose, Bld: 108 — ABNORMAL HIGH
Potassium: 3.8
Sodium: 143
Total Bilirubin: 1.1
Total Protein: 6.5

## 2010-11-27 LAB — TYPE AND SCREEN
ABO/RH(D): AB POS
Antibody Screen: NEGATIVE

## 2010-11-27 LAB — CK TOTAL AND CKMB (NOT AT ARMC)
CK, MB: 1.9
CK, MB: 6.3 — ABNORMAL HIGH
Relative Index: 0.4
Relative Index: 0.8
Total CK: 531 — ABNORMAL HIGH
Total CK: 789 — ABNORMAL HIGH

## 2010-11-27 LAB — URINE MICROSCOPIC-ADD ON

## 2010-11-27 LAB — APTT: aPTT: 32

## 2010-11-27 LAB — B-NATRIURETIC PEPTIDE (CONVERTED LAB): Pro B Natriuretic peptide (BNP): 333 — ABNORMAL HIGH

## 2010-11-28 LAB — PROTIME-INR
INR: 2.2 — ABNORMAL HIGH
Prothrombin Time: 26 — ABNORMAL HIGH

## 2010-12-05 ENCOUNTER — Ambulatory Visit (INDEPENDENT_AMBULATORY_CARE_PROVIDER_SITE_OTHER): Payer: Medicare Other | Admitting: *Deleted

## 2010-12-05 DIAGNOSIS — I4891 Unspecified atrial fibrillation: Secondary | ICD-10-CM

## 2010-12-05 LAB — POCT INR: INR: 2.2

## 2010-12-29 ENCOUNTER — Other Ambulatory Visit: Payer: Self-pay | Admitting: *Deleted

## 2010-12-29 MED ORDER — POTASSIUM CHLORIDE CRYS ER 20 MEQ PO TBCR: 20.0000 meq | EXTENDED_RELEASE_TABLET | Freq: Two times a day (BID) | ORAL | Status: DC

## 2011-01-02 ENCOUNTER — Ambulatory Visit (INDEPENDENT_AMBULATORY_CARE_PROVIDER_SITE_OTHER): Payer: Medicare Other | Admitting: *Deleted

## 2011-01-02 DIAGNOSIS — I4891 Unspecified atrial fibrillation: Secondary | ICD-10-CM

## 2011-01-02 DIAGNOSIS — Z7901 Long term (current) use of anticoagulants: Secondary | ICD-10-CM

## 2011-01-02 LAB — POCT INR: INR: 2.3

## 2011-01-05 ENCOUNTER — Encounter: Payer: Medicare Other | Admitting: *Deleted

## 2011-01-29 ENCOUNTER — Ambulatory Visit (INDEPENDENT_AMBULATORY_CARE_PROVIDER_SITE_OTHER): Payer: Medicare Other | Admitting: *Deleted

## 2011-01-29 DIAGNOSIS — Z7901 Long term (current) use of anticoagulants: Secondary | ICD-10-CM

## 2011-01-29 DIAGNOSIS — I4891 Unspecified atrial fibrillation: Secondary | ICD-10-CM

## 2011-01-29 LAB — POCT INR: INR: 3.2

## 2011-02-02 ENCOUNTER — Other Ambulatory Visit: Payer: Self-pay | Admitting: Cardiology

## 2011-02-02 MED ORDER — WARFARIN SODIUM 6 MG PO TABS: ORAL_TABLET | ORAL | Status: DC

## 2011-02-02 NOTE — Telephone Encounter (Signed)
Coumadin refill

## 2011-02-26 ENCOUNTER — Encounter: Payer: Medicare Other | Admitting: *Deleted

## 2011-03-05 ENCOUNTER — Ambulatory Visit (INDEPENDENT_AMBULATORY_CARE_PROVIDER_SITE_OTHER): Payer: Medicare Other | Admitting: *Deleted

## 2011-03-05 DIAGNOSIS — I4891 Unspecified atrial fibrillation: Secondary | ICD-10-CM

## 2011-03-05 DIAGNOSIS — Z7901 Long term (current) use of anticoagulants: Secondary | ICD-10-CM

## 2011-03-05 LAB — POCT INR: INR: 3

## 2011-03-19 ENCOUNTER — Ambulatory Visit (INDEPENDENT_AMBULATORY_CARE_PROVIDER_SITE_OTHER): Payer: Medicare Other | Admitting: *Deleted

## 2011-03-19 DIAGNOSIS — I4891 Unspecified atrial fibrillation: Secondary | ICD-10-CM

## 2011-03-19 DIAGNOSIS — Z7901 Long term (current) use of anticoagulants: Secondary | ICD-10-CM

## 2011-03-19 LAB — POCT INR: INR: 3.3

## 2011-04-09 ENCOUNTER — Other Ambulatory Visit: Payer: Self-pay | Admitting: *Deleted

## 2011-04-21 ENCOUNTER — Other Ambulatory Visit: Payer: Self-pay | Admitting: *Deleted

## 2011-04-21 MED ORDER — METOPROLOL SUCCINATE ER 50 MG PO TB24: ORAL_TABLET | ORAL | Status: DC

## 2011-04-22 ENCOUNTER — Ambulatory Visit (INDEPENDENT_AMBULATORY_CARE_PROVIDER_SITE_OTHER): Payer: Medicare Other | Admitting: *Deleted

## 2011-04-22 DIAGNOSIS — I4891 Unspecified atrial fibrillation: Secondary | ICD-10-CM

## 2011-04-22 DIAGNOSIS — Z7901 Long term (current) use of anticoagulants: Secondary | ICD-10-CM

## 2011-04-22 LAB — POCT INR: INR: 2.4

## 2011-05-21 ENCOUNTER — Ambulatory Visit (INDEPENDENT_AMBULATORY_CARE_PROVIDER_SITE_OTHER): Payer: Medicare Other | Admitting: Pharmacist

## 2011-05-21 DIAGNOSIS — Z7901 Long term (current) use of anticoagulants: Secondary | ICD-10-CM

## 2011-05-21 DIAGNOSIS — I4891 Unspecified atrial fibrillation: Secondary | ICD-10-CM

## 2011-05-21 LAB — POCT INR: INR: 2.7

## 2011-06-15 ENCOUNTER — Encounter: Payer: Self-pay | Admitting: *Deleted

## 2011-06-15 ENCOUNTER — Other Ambulatory Visit: Payer: Self-pay | Admitting: *Deleted

## 2011-06-15 MED ORDER — WARFARIN SODIUM 6 MG PO TABS: ORAL_TABLET | ORAL | Status: DC

## 2011-06-15 NOTE — Telephone Encounter (Signed)
This encounter was created in error - please disregard.

## 2011-06-18 ENCOUNTER — Ambulatory Visit (INDEPENDENT_AMBULATORY_CARE_PROVIDER_SITE_OTHER): Payer: Medicare Other

## 2011-06-18 DIAGNOSIS — Z7901 Long term (current) use of anticoagulants: Secondary | ICD-10-CM

## 2011-06-18 DIAGNOSIS — I4891 Unspecified atrial fibrillation: Secondary | ICD-10-CM

## 2011-06-29 ENCOUNTER — Encounter: Payer: Self-pay | Admitting: *Deleted

## 2011-06-29 ENCOUNTER — Encounter: Payer: Self-pay | Admitting: Cardiovascular Disease

## 2011-06-30 ENCOUNTER — Ambulatory Visit (INDEPENDENT_AMBULATORY_CARE_PROVIDER_SITE_OTHER): Payer: Medicare Other | Admitting: Cardiovascular Disease

## 2011-06-30 ENCOUNTER — Encounter: Payer: Self-pay | Admitting: Cardiovascular Disease

## 2011-06-30 VITALS — BP 130/75 | HR 68

## 2011-06-30 DIAGNOSIS — R609 Edema, unspecified: Secondary | ICD-10-CM

## 2011-06-30 DIAGNOSIS — I4891 Unspecified atrial fibrillation: Secondary | ICD-10-CM

## 2011-06-30 DIAGNOSIS — I10 Essential (primary) hypertension: Secondary | ICD-10-CM

## 2011-06-30 MED ORDER — TORSEMIDE 20 MG PO TABS: 20.0000 mg | ORAL_TABLET | Freq: Two times a day (BID) | ORAL | Status: DC

## 2011-06-30 NOTE — Assessment & Plan Note (Signed)
Well controlled.  Continue current medications and low sodium Dash type diet.    

## 2011-06-30 NOTE — Assessment & Plan Note (Signed)
Good rate control and anticoagulation F/U coumadin clinic 

## 2011-06-30 NOTE — Progress Notes (Signed)
Patient ID: [name], female   DOB: 20-Aug-1922, 78 y.o.   MRN: [id number] [name] is seen today in followup for her chronic atrial fibrillation, anticoagulation, lower extremity edema. She is from [place] but has been with her brother here in [place] for most of the past year. she needs a primary care physician. Not in any significant palpitations. She is still a good candidate for Coumadin despite her age. Her INRs have been therapeutic in our Coumadin clinic. She had multiple orthopedic surgeries past few years . 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 Her left knee seems to be improving and she does not think she'll need surgery for it. She has a brace for her. She's had a distant history of diastolic dysfunction which has been quiet and. She has lower extremity edema from venous insuficiency.  She does not like to wear here support hose.  She has only been taking her diuretic once/day and the edema is worse  ROS: Denies fever, malais, weight loss, blurry vision, decreased visual acuity, cough, sputum, SOB, hemoptysis, pleuritic pain, palpitaitons, heartburn, abdominal pain, melena,, claudication, or rash.  All other systems reviewed and negative  General: Affect appropriate Chronically ill elderly female HEENT: normal Neck supple with no adenopathy JVP normal no bruits no thyromegaly Lungs clear with no wheezing and good diaphragmatic motion Heart:  S1/S2 no murmur, no rub, gallop or click PMI normal Abdomen: benighn, BS positve, no tenderness, no AAA no bruit.  No HSM or HJR Distal pulses intact with no bruits Plus 3 bilateral  edema Neuro non-focal Skin warm and dry No muscular weakness   Current Outpatient Prescriptions  Medication Sig Dispense Refill  . calcium-vitamin D (OSCAL WITH D) 500-200 MG-UNIT per tablet Take 1 tablet by mouth daily.      . cholecalciferol (VITAMIN D) 1000 UNITS tablet Take 1,000 Units by mouth daily.      Marland Kitchen  HYDROcodone-acetaminophen (NORCO) 5-325 MG per tablet prn      . metoprolol succinate (TOPROL XL) 50 MG 24 hr tablet Take 1 and 1/2 (half) tablet twice daily  120 tablet  1  . Multiple Vitamins-Minerals (CENTRUM PO) Take 1 tablet by mouth daily.      . Omega-3 Fatty Acids (FISH OIL PO) Take 1 tablet by mouth daily.      . potassium chloride SA (K-DUR,KLOR-CON) 20 MEQ tablet Take 1 tablet (20 mEq total) by mouth 2 (two) times daily.  60 tablet  12  . torsemide (DEMADEX) 20 MG tablet Take 1 tablet (20 mg total) by mouth 2 (two) times daily.  60 tablet  2  . vitamin C (ASCORBIC ACID) 500 MG tablet Take 500 mg by mouth daily.      Marland Kitchen warfarin (COUMADIN) 6 MG tablet Take as directed by the Anticoagulation Clinic  40 tablet  3    Allergies  Meperidine hcl and Oxycodone-acetaminophen  Electrocardiogram:  afib rate 68 orhterwise normal ECG  Assessment and Plan

## 2011-06-30 NOTE — Patient Instructions (Signed)
Your physician wants you to follow-up in: YEAR WITH DR NISHAN  You will receive a reminder letter in the mail two months in advance. If you don't receive a letter, please call our office to schedule the follow-up appointment.  Your physician recommends that you continue on your current medications as directed. Please refer to the Current Medication list given to you today. 

## 2011-06-30 NOTE — Assessment & Plan Note (Signed)
Dependant form venous insuf.  Encouraged her to elevate legs when possible , limit salt.  Where support hose and take diuretic bid as prescribed.  F/U Dr Bayard Hugger

## 2011-07-06 ENCOUNTER — Other Ambulatory Visit: Payer: Self-pay | Admitting: Cardiovascular Disease

## 2011-07-16 ENCOUNTER — Ambulatory Visit (INDEPENDENT_AMBULATORY_CARE_PROVIDER_SITE_OTHER): Payer: Medicare Other

## 2011-07-16 DIAGNOSIS — I4891 Unspecified atrial fibrillation: Secondary | ICD-10-CM

## 2011-07-16 DIAGNOSIS — Z7901 Long term (current) use of anticoagulants: Secondary | ICD-10-CM

## 2011-07-16 LAB — POCT INR: INR: 2.3

## 2011-08-13 ENCOUNTER — Ambulatory Visit (INDEPENDENT_AMBULATORY_CARE_PROVIDER_SITE_OTHER): Payer: Medicare Other | Admitting: *Deleted

## 2011-08-13 DIAGNOSIS — Z7901 Long term (current) use of anticoagulants: Secondary | ICD-10-CM

## 2011-08-13 DIAGNOSIS — I4891 Unspecified atrial fibrillation: Secondary | ICD-10-CM

## 2011-08-13 LAB — POCT INR: INR: 2.7

## 2011-08-18 ENCOUNTER — Other Ambulatory Visit: Payer: Self-pay | Admitting: Cardiovascular Disease

## 2011-09-10 ENCOUNTER — Ambulatory Visit (INDEPENDENT_AMBULATORY_CARE_PROVIDER_SITE_OTHER): Payer: Medicare Other

## 2011-09-10 DIAGNOSIS — Z7901 Long term (current) use of anticoagulants: Secondary | ICD-10-CM

## 2011-09-10 DIAGNOSIS — I4891 Unspecified atrial fibrillation: Secondary | ICD-10-CM

## 2011-09-10 LAB — POCT INR: INR: 3.3

## 2011-10-08 ENCOUNTER — Ambulatory Visit (INDEPENDENT_AMBULATORY_CARE_PROVIDER_SITE_OTHER): Payer: Medicare Other | Admitting: *Deleted

## 2011-10-08 DIAGNOSIS — Z7901 Long term (current) use of anticoagulants: Secondary | ICD-10-CM

## 2011-10-08 DIAGNOSIS — I4891 Unspecified atrial fibrillation: Secondary | ICD-10-CM

## 2011-10-08 LAB — POCT INR: INR: 2.9

## 2011-10-19 ENCOUNTER — Other Ambulatory Visit: Payer: Self-pay | Admitting: Cardiovascular Disease

## 2011-10-29 ENCOUNTER — Ambulatory Visit (INDEPENDENT_AMBULATORY_CARE_PROVIDER_SITE_OTHER): Payer: Medicare Other

## 2011-10-29 DIAGNOSIS — Z7901 Long term (current) use of anticoagulants: Secondary | ICD-10-CM

## 2011-10-29 DIAGNOSIS — I4891 Unspecified atrial fibrillation: Secondary | ICD-10-CM

## 2011-10-29 LAB — POCT INR: INR: 1.9

## 2011-11-26 ENCOUNTER — Ambulatory Visit (INDEPENDENT_AMBULATORY_CARE_PROVIDER_SITE_OTHER): Payer: Medicare Other | Admitting: *Deleted

## 2011-11-26 DIAGNOSIS — I4891 Unspecified atrial fibrillation: Secondary | ICD-10-CM

## 2011-11-26 DIAGNOSIS — Z7901 Long term (current) use of anticoagulants: Secondary | ICD-10-CM

## 2011-11-26 LAB — POCT INR: INR: 2

## 2011-12-24 ENCOUNTER — Ambulatory Visit (INDEPENDENT_AMBULATORY_CARE_PROVIDER_SITE_OTHER): Payer: Medicare Other | Admitting: *Deleted

## 2011-12-24 DIAGNOSIS — Z7901 Long term (current) use of anticoagulants: Secondary | ICD-10-CM

## 2011-12-24 DIAGNOSIS — I4891 Unspecified atrial fibrillation: Secondary | ICD-10-CM

## 2011-12-24 LAB — POCT INR: INR: 2.3

## 2012-02-02 ENCOUNTER — Ambulatory Visit (INDEPENDENT_AMBULATORY_CARE_PROVIDER_SITE_OTHER): Payer: Medicare Other | Admitting: *Deleted

## 2012-02-02 DIAGNOSIS — Z7901 Long term (current) use of anticoagulants: Secondary | ICD-10-CM

## 2012-02-02 DIAGNOSIS — I4891 Unspecified atrial fibrillation: Secondary | ICD-10-CM

## 2012-02-02 LAB — POCT INR: INR: 3

## 2012-02-11 ENCOUNTER — Telehealth: Payer: Self-pay | Admitting: Cardiovascular Disease

## 2012-02-11 MED ORDER — POTASSIUM CHLORIDE CRYS ER 20 MEQ PO TBCR: 20.0000 meq | EXTENDED_RELEASE_TABLET | Freq: Two times a day (BID) | ORAL | Status: DC

## 2012-02-11 MED ORDER — METOPROLOL SUCCINATE ER 50 MG PO TB24: ORAL_TABLET | ORAL | Status: DC

## 2012-02-11 NOTE — Telephone Encounter (Signed)
plz return call to pt [phone] regarding medication. Pt very upset, states she doesn't know whats going on would like return call to advise.

## 2012-03-08 ENCOUNTER — Ambulatory Visit (INDEPENDENT_AMBULATORY_CARE_PROVIDER_SITE_OTHER): Payer: Medicare PPO | Admitting: *Deleted

## 2012-03-08 DIAGNOSIS — I4891 Unspecified atrial fibrillation: Secondary | ICD-10-CM

## 2012-03-08 DIAGNOSIS — Z7901 Long term (current) use of anticoagulants: Secondary | ICD-10-CM

## 2012-03-08 LAB — POCT INR: INR: 2.8

## 2012-03-15 ENCOUNTER — Other Ambulatory Visit: Payer: Self-pay

## 2012-03-15 ENCOUNTER — Telehealth: Payer: Self-pay | Admitting: Cardiovascular Disease

## 2012-03-15 MED ORDER — WARFARIN SODIUM 6 MG PO TABS: ORAL_TABLET | ORAL | Status: DC

## 2012-03-15 NOTE — Telephone Encounter (Signed)
Called pt to inform her that her coumadin has been refilled and she states understanding

## 2012-03-15 NOTE — Telephone Encounter (Signed)
Pt needs refill on warfarin sent into [facility] [address] phone# [phone]

## 2012-04-21 ENCOUNTER — Ambulatory Visit (INDEPENDENT_AMBULATORY_CARE_PROVIDER_SITE_OTHER): Payer: Medicare PPO | Admitting: *Deleted

## 2012-04-21 DIAGNOSIS — Z7901 Long term (current) use of anticoagulants: Secondary | ICD-10-CM

## 2012-04-21 DIAGNOSIS — I4891 Unspecified atrial fibrillation: Secondary | ICD-10-CM

## 2012-04-21 LAB — POCT INR: INR: 2.4

## 2012-05-01 ENCOUNTER — Observation Stay (HOSPITAL_COMMUNITY)
Admission: EM | Admit: 2012-05-01 | Discharge: 2012-05-05 | Disposition: A | Discharge status: Skilled Nursing Facility | Payer: Medicare PPO | Attending: Internal Medicine | Admitting: Internal Medicine

## 2012-05-01 ENCOUNTER — Encounter (HOSPITAL_COMMUNITY): Payer: Self-pay | Admitting: Emergency Medicine

## 2012-05-01 DIAGNOSIS — M25539 Pain in unspecified wrist: Secondary | ICD-10-CM | POA: Insufficient documentation

## 2012-05-01 DIAGNOSIS — C50919 Malignant neoplasm of unspecified site of unspecified female breast: Secondary | ICD-10-CM

## 2012-05-01 DIAGNOSIS — L02419 Cutaneous abscess of limb, unspecified: Secondary | ICD-10-CM | POA: Insufficient documentation

## 2012-05-01 DIAGNOSIS — R609 Edema, unspecified: Secondary | ICD-10-CM

## 2012-05-01 DIAGNOSIS — Z7901 Long term (current) use of anticoagulants: Secondary | ICD-10-CM

## 2012-05-01 DIAGNOSIS — R079 Chest pain, unspecified: Secondary | ICD-10-CM

## 2012-05-01 DIAGNOSIS — L03119 Cellulitis of unspecified part of limb: Secondary | ICD-10-CM | POA: Insufficient documentation

## 2012-05-01 DIAGNOSIS — W19XXXA Unspecified fall, initial encounter: Secondary | ICD-10-CM

## 2012-05-01 DIAGNOSIS — M25552 Pain in left hip: Secondary | ICD-10-CM

## 2012-05-01 DIAGNOSIS — K5900 Constipation, unspecified: Secondary | ICD-10-CM | POA: Insufficient documentation

## 2012-05-01 DIAGNOSIS — R0789 Other chest pain: Secondary | ICD-10-CM | POA: Insufficient documentation

## 2012-05-01 DIAGNOSIS — I509 Heart failure, unspecified: Secondary | ICD-10-CM | POA: Insufficient documentation

## 2012-05-01 DIAGNOSIS — M1990 Unspecified osteoarthritis, unspecified site: Secondary | ICD-10-CM

## 2012-05-01 DIAGNOSIS — I5030 Unspecified diastolic (congestive) heart failure: Secondary | ICD-10-CM

## 2012-05-01 DIAGNOSIS — I5032 Chronic diastolic (congestive) heart failure: Secondary | ICD-10-CM | POA: Insufficient documentation

## 2012-05-01 DIAGNOSIS — Z96649 Presence of unspecified artificial hip joint: Secondary | ICD-10-CM | POA: Insufficient documentation

## 2012-05-01 DIAGNOSIS — I10 Essential (primary) hypertension: Secondary | ICD-10-CM

## 2012-05-01 DIAGNOSIS — M25559 Pain in unspecified hip: Principal | ICD-10-CM | POA: Insufficient documentation

## 2012-05-01 DIAGNOSIS — I4891 Unspecified atrial fibrillation: Secondary | ICD-10-CM

## 2012-05-01 DIAGNOSIS — Y92009 Unspecified place in unspecified non-institutional (private) residence as the place of occurrence of the external cause: Secondary | ICD-10-CM | POA: Insufficient documentation

## 2012-05-01 LAB — CBC WITH DIFFERENTIAL/PLATELET
Basophils Absolute: 0 10*3/uL (ref 0.0–0.1)
Basophils Relative: 0 % (ref 0–1)
Eosinophils Absolute: 0.2 10*3/uL (ref 0.0–0.7)
Eosinophils Relative: 2 % (ref 0–5)
HCT: 41.2 % (ref 36.0–46.0)
Hemoglobin: 13.6 g/dL (ref 12.0–15.0)
Lymphocytes Relative: 16 % (ref 12–46)
Lymphs Abs: 1.3 10*3/uL (ref 0.7–4.0)
MCH: 30.6 pg (ref 26.0–34.0)
MCHC: 33 g/dL (ref 30.0–36.0)
MCV: 92.8 fL (ref 78.0–100.0)
Monocytes Absolute: 1.1 10*3/uL — ABNORMAL HIGH (ref 0.1–1.0)
Monocytes Relative: 13 % — ABNORMAL HIGH (ref 3–12)
Neutro Abs: 5.6 10*3/uL (ref 1.7–7.7)
Neutrophils Relative %: 69 % (ref 43–77)
Platelets: 201 10*3/uL (ref 150–400)
RBC: 4.44 MIL/uL (ref 3.87–5.11)
RDW: 14.1 % (ref 11.5–15.5)
WBC: 8.2 10*3/uL (ref 4.0–10.5)

## 2012-05-01 LAB — COMPREHENSIVE METABOLIC PANEL
ALT: 18 U/L (ref 0–35)
AST: 29 U/L (ref 0–37)
Albumin: 3.5 g/dL (ref 3.5–5.2)
Alkaline Phosphatase: 76 U/L (ref 39–117)
BUN: 26 mg/dL — ABNORMAL HIGH (ref 6–23)
CO2: 26 mEq/L (ref 19–32)
Calcium: 9.3 mg/dL (ref 8.4–10.5)
Chloride: 106 mEq/L (ref 96–112)
Creatinine, Ser: 0.84 mg/dL (ref 0.50–1.10)
GFR calc Af Amer: 70 mL/min — ABNORMAL LOW (ref >90)
GFR calc non Af Amer: 61 mL/min — ABNORMAL LOW (ref >90)
Glucose, Bld: 105 mg/dL — ABNORMAL HIGH (ref 70–99)
Potassium: 4.3 mEq/L (ref 3.5–5.1)
Sodium: 140 mEq/L (ref 135–145)
Total Bilirubin: 0.7 mg/dL (ref 0.3–1.2)
Total Protein: 7 g/dL (ref 6.0–8.3)

## 2012-05-01 LAB — APTT: aPTT: 43 seconds — ABNORMAL HIGH (ref 24–37)

## 2012-05-01 LAB — PROTIME-INR
INR: 3.16 — ABNORMAL HIGH (ref 0.00–1.49)
Prothrombin Time: 30.7 seconds — ABNORMAL HIGH (ref 11.6–15.2)

## 2012-05-01 MED ORDER — ONDANSETRON HCL 4 MG/2ML IJ SOLN
4.0000 mg | Freq: Once | INTRAMUSCULAR | Status: AC
  Administered 2012-05-01: 4 mg via INTRAVENOUS
  Filled 2012-05-01: qty 2

## 2012-05-01 MED ORDER — MORPHINE SULFATE 4 MG/ML IJ SOLN
4.0000 mg | Freq: Once | INTRAMUSCULAR | Status: AC
  Administered 2012-05-01: 4 mg via INTRAVENOUS
  Filled 2012-05-01: qty 1

## 2012-05-01 NOTE — ED Provider Notes (Signed)
History     CSN: [id number]  Arrival date & time 05/01/12  2224   First MD Initiated Contact with Patient 05/01/12 2257      Chief Complaint  Patient presents with  . Fall    (Consider location/radiation/quality/duration/timing/severity/associated sxs/prior treatment) HPI Patient reports at 3 AM this morning she had gotten up and was standing at the kitchen counter taking her pain pill. She states she suddenly fell backwards onto her left side. She states she was not dizzy or lightheaded. She denies tripping. She states "I just fell". She states she hit her head but denies loss of consciousness. She states that since she fell she has pain in her left hip and groin and is having difficulty ambulating. She denies headache, blurred vision, nausea, vomiting, chest pain or shortness of breath.  Patient has had bilateral total hip replacement and has had right total knee replacement.  PCP Dr Sheryle Hail Cardiology Dr [facility] Dr Turner Daniels  Past Medical History  Diagnosis Date  . CARCINOMA, BREAST   . HYPERTENSION   . Atrial fibrillation   . Unspecified diastolic heart failure   . DEGENERATIVE JOINT DISEASE   . Edema   . CHEST PAIN   . Long term (current) use of anticoagulants     Past Surgical History  Procedure Laterality Date  . Knee surgery    . Hip surgery    . Breast lumpectomy    . Tonsillectomy    . Laminectomy      No family history on file.  History  Substance Use Topics  . Smoking status: Never Smoker   . Smokeless tobacco: Not on file  . Alcohol Use: No  Lives at home Lives alone  OB History   Grav Para Term Preterm Abortions TAB SAB Ect Mult Living                  Review of Systems  All other systems reviewed and are negative.    Allergies  Meperidine hcl and Oxycodone-acetaminophen  Home Medications   Current Outpatient Rx  Name  Route  Sig  Dispense  Refill  . calcium-vitamin D (OSCAL WITH D) 500-200 MG-UNIT per tablet   Oral    Take 1 tablet by mouth daily.         . cholecalciferol (VITAMIN D) 1000 UNITS tablet   Oral   Take 1,000 Units by mouth daily.         Marland Kitchen HYDROcodone-acetaminophen (NORCO) 5-325 MG per tablet   Oral   Take 1 tablet by mouth 2 (two) times daily as needed for pain. prn         . metoprolol succinate (TOPROL-XL) 50 MG 24 hr tablet   Oral   Take 75 mg by mouth 2 (two) times daily. Take with or immediately following a meal.         . Multiple Vitamins-Minerals (CENTRUM PO)   Oral   Take 1 tablet by mouth daily.         . Omega-3 Fatty Acids (FISH OIL PO)   Oral   Take 1 tablet by mouth daily.         . potassium chloride SA (K-DUR,KLOR-CON) 20 MEQ tablet   Oral   Take 1 tablet (20 mEq total) by mouth 2 (two) times daily.   60 tablet   12   . torsemide (DEMADEX) 20 MG tablet   Oral   Take 20 mg by mouth 2 (two) times daily.         Marland Kitchen  vitamin C (ASCORBIC ACID) 500 MG tablet   Oral   Take 500 mg by mouth daily.         Marland Kitchen warfarin (COUMADIN) 6 MG tablet   Oral   Take 6-9 mg by mouth See admin instructions. Takes 1.5 tablets Mon,Wed and Fri.  Takes 1 tablet Sun,Tues,Thurs and sat           BP 147/82  Pulse 64  Temp(Src) 98.3 F (36.8 C) (Oral)  Resp 16  SpO2 98%  Vital signs normal    Physical Exam  Nursing note and vitals reviewed. Constitutional: She is oriented to person, place, and time. She appears well-developed and well-nourished.  Non-toxic appearance. She does not appear ill. No distress.  HENT:  Head: Normocephalic and atraumatic.  Right Ear: External ear normal.  Left Ear: External ear normal.  Nose: Nose normal. No mucosal edema or rhinorrhea.  Mouth/Throat: Oropharynx is clear and moist and mucous membranes are normal. No dental abscesses or edematous.  Eyes: Conjunctivae and EOM are normal. Pupils are equal, round, and reactive to light.  Neck: Normal range of motion and full passive range of motion without pain. Neck supple.   Cardiovascular: Normal rate, regular rhythm and normal heart sounds.  Exam reveals no gallop and no friction rub.   No murmur heard. Pulmonary/Chest: Effort normal and breath sounds normal. No respiratory distress. She has no wheezes. She has no rhonchi. She has no rales. She exhibits no tenderness and no crepitus.  Abdominal: Soft. Normal appearance and bowel sounds are normal. She exhibits no distension. There is no tenderness. There is no rebound and no guarding.  Musculoskeletal: Normal range of motion. She exhibits no edema and no tenderness.  Has tenderness in her right true hip joint (chronic), also tender in her left hip and her pubis. Has had RTKR. Has diffuse swelling and effusion of her left knee ? Chronic, patient can't tell me if the swelling is new or old, states she needs a LTKR but can't have surgery b/o her heart.   Neurological: She is alert and oriented to person, place, and time. She has normal strength. No cranial nerve deficit.  Skin: Skin is warm, dry and intact. No rash noted. No erythema. No pallor.  Psychiatric: She has a normal mood and affect. Her speech is normal and behavior is normal. Her mood appears not anxious.    ED Course  Procedures (including critical care time) Medications  morphine 4 MG/ML injection 4 mg (4 mg Intravenous Given 05/01/12 2343)  ondansetron (ZOFRAN) injection 4 mg (4 mg Intravenous Given 05/01/12 2343)   Nursing staff attempted to have patient stand, states she is unable to bear weight on her left leg, indicates pain in her left hip.   I reexamined her leg, she is not tender in the thigh.   03:43 Dr Toniann Fail admit to med-surg team 10  Results for orders placed during the hospital encounter of 05/01/12  APTT      Result Value Range   aPTT 43 (*) 24 - 37 seconds  PROTIME-INR      Result Value Range   Prothrombin Time 30.7 (*) 11.6 - 15.2 seconds   INR 3.16 (*) 0.00 - 1.49  CBC WITH DIFFERENTIAL      Result Value Range   WBC 8.2   4.0 - 10.5 K/uL   RBC 4.44  3.87 - 5.11 MIL/uL   Hemoglobin 13.6  12.0 - 15.0 g/dL   HCT 16.1  09.6 - 04.5 %  MCV 92.8  78.0 - 100.0 fL   MCH 30.6  26.0 - 34.0 pg   MCHC 33.0  30.0 - 36.0 g/dL   RDW 54.0  98.1 - 19.1 %   Platelets 201  150 - 400 K/uL   Neutrophils Relative 69  43 - 77 %   Neutro Abs 5.6  1.7 - 7.7 K/uL   Lymphocytes Relative 16  12 - 46 %   Lymphs Abs 1.3  0.7 - 4.0 K/uL   Monocytes Relative 13 (*) 3 - 12 %   Monocytes Absolute 1.1 (*) 0.1 - 1.0 K/uL   Eosinophils Relative 2  0 - 5 %   Eosinophils Absolute 0.2  0.0 - 0.7 K/uL   Basophils Relative 0  0 - 1 %   Basophils Absolute 0.0  0.0 - 0.1 K/uL  COMPREHENSIVE METABOLIC PANEL      Result Value Range   Sodium 140  135 - 145 mEq/L   Potassium 4.3  3.5 - 5.1 mEq/L   Chloride 106  96 - 112 mEq/L   CO2 26  19 - 32 mEq/L   Glucose, Bld 105 (*) 70 - 99 mg/dL   BUN 26 (*) 6 - 23 mg/dL   Creatinine, Ser 4.78  0.50 - 1.10 mg/dL   Calcium 9.3  8.4 - 29.5 mg/dL   Total Protein 7.0  6.0 - 8.3 g/dL   Albumin 3.5  3.5 - 5.2 g/dL   AST 29  0 - 37 U/L   ALT 18  0 - 35 U/L   Alkaline Phosphatase 76  39 - 117 U/L   Total Bilirubin 0.7  0.3 - 1.2 mg/dL   GFR calc non Af Amer 61 (*) >90 mL/min   GFR calc Af Amer 70 (*) >90 mL/min  URINALYSIS, ROUTINE W REFLEX MICROSCOPIC      Result Value Range   Color, Urine YELLOW  YELLOW   APPearance CLOUDY (*) CLEAR   Specific Gravity, Urine 1.015  1.005 - 1.030   pH 5.5  5.0 - 8.0   Glucose, UA NEGATIVE  NEGATIVE mg/dL   Hgb urine dipstick SMALL (*) NEGATIVE   Bilirubin Urine NEGATIVE  NEGATIVE   Ketones, ur NEGATIVE  NEGATIVE mg/dL   Protein, ur NEGATIVE  NEGATIVE mg/dL   Urobilinogen, UA 0.2  0.0 - 1.0 mg/dL   Nitrite NEGATIVE  NEGATIVE   Leukocytes, UA NEGATIVE  NEGATIVE  URINE MICROSCOPIC-ADD ON      Result Value Range   Squamous Epithelial / LPF FEW (*) RARE   WBC, UA 0-2  <3 WBC/hpf   RBC / HPF 3-6  <3 RBC/hpf   Bacteria, UA FEW (*) RARE   Casts HYALINE CASTS (*)  NEGATIVE   Laboratory interpretation all normal     Dg Chest 1 View  05/02/2012  *RADIOLOGY REPORT*  Clinical Data: Status post fall; left hip pain.  CHEST - 1 VIEW  Comparison: Chest radiograph performed 09/08/2007  Findings: The lungs are well-aerated.  Vascular congestion is noted, with mildly increased interstitial markings, possibly reflecting transient interstitial edema.  There is no evidence of pleural effusion or pneumothorax.  The cardiomediastinal silhouette is mildly enlarged.  No acute osseous abnormalities are seen.  Clips are noted overlying the right axilla.  Degenerative change is noted at both glenohumeral joints.  IMPRESSION: Vascular congestion and mild cardiomegaly, with mildly increased interstitial markings, possibly reflecting transient interstitial edema.  No displaced rib fractures seen.   Original Report Authenticated By: Tonia Ghent, M.D.  Dg Hip Complete Left  05/02/2012  *RADIOLOGY REPORT*  Clinical Data: Status post fall; left hip pain.  LEFT HIP - COMPLETE 2+ VIEW  Comparison: Left hip radiographs performed 10/20/2004  Findings: There is no evidence of fracture or dislocation. Bilateral femoral prostheses appear grossly intact, though the right-sided prosthesis is incompletely imaged on this study.  Both femoral prostheses are seated normally at their respective acetabula, without evidence of loosening.  The proximal left femur appears intact.  Mild degenerative change is noted at the lower lumbar spine.  The sacroiliac joints are unremarkable in appearance.  Apparent lucency on the pelvis radiograph overlying the femoral prostheses is not characterized on the left hip view, and is likely artifactual in nature.  The visualized bowel gas pattern is grossly unremarkable in appearance.  Scattered phleboliths are noted within the pelvis.  IMPRESSION: No evidence of fracture or dislocation.  Bilateral femoral prostheses appear intact, without evidence of loosening.   Original  Report Authenticated By: Tonia Ghent, M.D.    Ct Head Wo Contrast  05/02/2012  *RADIOLOGY REPORT*  Clinical Data: Fall  CT HEAD WITHOUT CONTRAST  Technique:  Contiguous axial images were obtained from the base of the skull through the vertex without contrast.  Comparison: None.  Findings: No intracranial hemorrhage.  No parenchymal contusion. No midline shift or mass effect.  Basilar cisterns are patent. No skull base fracture.  No fluid in the paranasal sinuses or mastoid air cells.  There is mild periventricular white matter hypodensities.  IMPRESSION: No intracranial trauma.  Atrophy and microvascular disease.   Original Report Authenticated By: Genevive Bi, M.D.    Ct Pelvis Wo Contrast  Ct Hip Left Wo Contrast  05/02/2012  *RADIOLOGY REPORT*  Clinical Data:  Status post fall; left hip and groin pain.  Unable to bear weight on left leg.  CT PELVIS AND LEFT LOWER EXTREMITY WITHOUT CONTRAST  Technique:  Multidetector CT imaging of the pelvis and left lower extremity was performed according to the standard protocol without intravenous contrast.  Multiplanar CT image reconstructions were also generated.  Comparison:  Left hip radiographs performed earlier today at 12:00 a.m.  CT PELVIS  Findings:  There is no evidence of fracture or dislocation.  The sacrum appears grossly intact, though an occult fracture cannot be entirely excluded on CT.  The sacroiliac joints are grossly unremarkable in appearance.  The acetabula are difficult to fully characterize but appear grossly intact; evaluation is mildly suboptimal due to metal artifact.  The femoral prostheses are noted in expected alignment, and appear intact bilaterally, without evidence of loosening.  Mild degenerative change is noted at the lower lumbar spine.  There is slightly unusual low attenuation at the expected location of the uterus; this likely reflects the patient's baseline, given the patient's age.  There is ectasia of the right common iliac  artery to 1.6 cm, with mild associated calcification.  The visualized small and large bowel loops are grossly unremarkable in appearance.  The appendix is normal in caliber and contains minimal contrast.  Scattered vascular calcifications are seen.  No significant soft tissue injury is noted.  IMPRESSION:  1.  No evidence of fracture or dislocation. 2.  Bilateral femoral prostheses appear intact, without evidence of loosening. 3.  Ectasia of the right common iliac artery to 1.6 cm, with mild associated calcific atherosclerosis.  CT LEFT LOWER EXTREMITY  Findings: The proximal left femur appears intact.  There is no evidence of fracture or loosening.  The left femoral prosthesis is  seated in expected position.  There is no definite evidence for particle disease.  There is mild osteopenia of visualized osseous structures.  The right femoral prosthesis also appears intact, without evidence of loosening.  There is mild soft tissue injury along the lateral aspect of the left mid thigh, with mild soft tissue inflammation.  The underlying musculature is unremarkable in appearance.  The vasculature is not well assessed without contrast.  Visualized inguinal nodes remain borderline normal in size.  IMPRESSION:  1.  No evidence of fracture or dislocation. 2.  Femoral prostheses appear intact, without evidence of loosening. 3.  Mild osteopenia of visualized osseous structures. 4.  Mild soft tissue injury along the lateral aspect of the left mid thigh, with mild soft tissue inflammation.  Underlying musculature is grossly unremarkable.   Original Report Authenticated By: Tonia Ghent, M.D.    Dg Knee Complete 4 Views Left  05/02/2012  *RADIOLOGY REPORT*  Clinical Data: Fall, knee pain  LEFT KNEE - COMPLETE 4+ VIEW  Comparison: none  Findings: There is no evidence of acute fracture of the left knee. There is severe joint space narrowing of the lateral compartment. There is osteophytosis of the patellofemoral compartment.  No  joint effusion.  IMPRESSION: 1.  No acute fracture of the left knee. 2.  Severe osteoarthritis of the lateral compartment.   Original Report Authenticated By: Genevive Bi, M.D.      Date: 05/01/2012  Rate: 71  Rhythm: atrial fibrillation  QRS Axis: normal  Intervals: normal  ST/T Wave abnormalities: normal  Conduction Disutrbances:none  Narrative Interpretation:   Old EKG Reviewed: none available     1. Fall, initial encounter   2. Hip pain, acute, left     Plan admission  Devoria Albe, MD, FACEP   MDM          Ward Givens, MD 05/02/12 [phone]

## 2012-05-01 NOTE — ED Notes (Signed)
No gross obvious deformities upon initial assessment. Pt denies LOC. Denies pain to head and neck.

## 2012-05-02 ENCOUNTER — Emergency Department (HOSPITAL_COMMUNITY): Payer: Medicare PPO

## 2012-05-02 ENCOUNTER — Observation Stay (HOSPITAL_COMMUNITY): Payer: Medicare PPO

## 2012-05-02 ENCOUNTER — Encounter (HOSPITAL_COMMUNITY): Payer: Self-pay | Admitting: Radiology

## 2012-05-02 DIAGNOSIS — M25559 Pain in unspecified hip: Secondary | ICD-10-CM | POA: Diagnosis present

## 2012-05-02 DIAGNOSIS — W19XXXA Unspecified fall, initial encounter: Secondary | ICD-10-CM

## 2012-05-02 LAB — URINALYSIS, ROUTINE W REFLEX MICROSCOPIC
Bilirubin Urine: NEGATIVE
Glucose, UA: NEGATIVE mg/dL
Ketones, ur: NEGATIVE mg/dL
Leukocytes, UA: NEGATIVE
Nitrite: NEGATIVE
Protein, ur: NEGATIVE mg/dL
Specific Gravity, Urine: 1.015 (ref 1.005–1.030)
Urobilinogen, UA: 0.2 mg/dL (ref 0.0–1.0)
pH: 5.5 (ref 5.0–8.0)

## 2012-05-02 LAB — URINE MICROSCOPIC-ADD ON

## 2012-05-02 LAB — TROPONIN I: Troponin I: <0.3 ng/mL (ref <0.30)

## 2012-05-02 LAB — D-DIMER, QUANTITATIVE: D-Dimer, Quant: 0.83 ug/mL-FEU — ABNORMAL HIGH (ref 0.00–0.48)

## 2012-05-02 MED ORDER — HYDROCODONE-ACETAMINOPHEN 5-325 MG PO TABS
2.0000 | ORAL_TABLET | Freq: Four times a day (QID) | ORAL | Status: DC | PRN
  Administered 2012-05-02 (×2): 2 via ORAL
  Filled 2012-05-02 (×2): qty 2

## 2012-05-02 MED ORDER — HYDROCODONE-ACETAMINOPHEN 5-325 MG PO TABS
1.0000 | ORAL_TABLET | Freq: Two times a day (BID) | ORAL | Status: DC | PRN
  Administered 2012-05-03 (×2): 1 via ORAL
  Filled 2012-05-02 (×2): qty 1

## 2012-05-02 MED ORDER — ONDANSETRON HCL 4 MG PO TABS: 4.0000 mg | ORAL_TABLET | Freq: Four times a day (QID) | ORAL | Status: DC | PRN

## 2012-05-02 MED ORDER — VITAMIN C 500 MG PO TABS
500.0000 mg | ORAL_TABLET | Freq: Every day | ORAL | Status: DC
  Administered 2012-05-02 – 2012-05-05 (×4): 500 mg via ORAL
  Filled 2012-05-02 (×5): qty 1

## 2012-05-02 MED ORDER — TORSEMIDE 20 MG PO TABS
20.0000 mg | ORAL_TABLET | Freq: Two times a day (BID) | ORAL | Status: DC
  Administered 2012-05-02: 20 mg via ORAL
  Filled 2012-05-02 (×2): qty 1

## 2012-05-02 MED ORDER — ONDANSETRON HCL 4 MG/2ML IJ SOLN
4.0000 mg | Freq: Four times a day (QID) | INTRAMUSCULAR | Status: DC | PRN
  Administered 2012-05-05: 4 mg via INTRAVENOUS
  Filled 2012-05-02: qty 2

## 2012-05-02 MED ORDER — ONDANSETRON HCL 4 MG/2ML IJ SOLN: 4.0000 mg | Freq: Three times a day (TID) | INTRAMUSCULAR | Status: AC | PRN

## 2012-05-02 MED ORDER — WARFARIN - PHARMACIST DOSING INPATIENT: Freq: Every day | Status: DC

## 2012-05-02 MED ORDER — MORPHINE SULFATE 2 MG/ML IJ SOLN
1.0000 mg | INTRAMUSCULAR | Status: DC | PRN
  Administered 2012-05-03 – 2012-05-05 (×3): 1 mg via INTRAVENOUS
  Filled 2012-05-02 (×4): qty 1

## 2012-05-02 MED ORDER — ACETAMINOPHEN 650 MG RE SUPP: 650.0000 mg | Freq: Four times a day (QID) | RECTAL | Status: DC | PRN

## 2012-05-02 MED ORDER — OMEGA-3-ACID ETHYL ESTERS 1 G PO CAPS
1.0000 g | ORAL_CAPSULE | Freq: Every day | ORAL | Status: DC
  Administered 2012-05-02 – 2012-05-05 (×4): 1 g via ORAL
  Filled 2012-05-02 (×4): qty 1

## 2012-05-02 MED ORDER — POTASSIUM CHLORIDE CRYS ER 20 MEQ PO TBCR
20.0000 meq | EXTENDED_RELEASE_TABLET | Freq: Two times a day (BID) | ORAL | Status: DC
  Administered 2012-05-02 – 2012-05-05 (×7): 20 meq via ORAL
  Filled 2012-05-02 (×9): qty 1

## 2012-05-02 MED ORDER — SODIUM CHLORIDE 0.9 % IJ SOLN
3.0000 mL | Freq: Two times a day (BID) | INTRAMUSCULAR | Status: DC
  Administered 2012-05-03: 3 mL via INTRAVENOUS

## 2012-05-02 MED ORDER — SODIUM CHLORIDE 0.9 % IJ SOLN
3.0000 mL | Freq: Two times a day (BID) | INTRAMUSCULAR | Status: DC
  Administered 2012-05-03 – 2012-05-05 (×3): 3 mL via INTRAVENOUS

## 2012-05-02 MED ORDER — WARFARIN SODIUM 6 MG PO TABS
6.0000 mg | ORAL_TABLET | Freq: Once | ORAL | Status: AC
  Administered 2012-05-02: 6 mg via ORAL
  Filled 2012-05-02: qty 1

## 2012-05-02 MED ORDER — METOPROLOL SUCCINATE ER 50 MG PO TB24
75.0000 mg | ORAL_TABLET | Freq: Two times a day (BID) | ORAL | Status: DC
  Filled 2012-05-02 (×5): qty 1

## 2012-05-02 MED ORDER — TORSEMIDE 20 MG PO TABS
20.0000 mg | ORAL_TABLET | Freq: Two times a day (BID) | ORAL | Status: DC
  Administered 2012-05-02 – 2012-05-05 (×7): 20 mg via ORAL
  Filled 2012-05-02 (×8): qty 1

## 2012-05-02 MED ORDER — ACETAMINOPHEN 325 MG PO TABS: 650.0000 mg | ORAL_TABLET | Freq: Four times a day (QID) | ORAL | Status: DC | PRN

## 2012-05-02 MED ORDER — CEPHALEXIN 500 MG PO CAPS
500.0000 mg | ORAL_CAPSULE | Freq: Two times a day (BID) | ORAL | Status: DC
  Administered 2012-05-02 – 2012-05-05 (×7): 500 mg via ORAL
  Filled 2012-05-02 (×8): qty 1

## 2012-05-02 MED ORDER — SODIUM CHLORIDE 0.9 % IV SOLN
INTRAVENOUS | Status: AC
  Administered 2012-05-02: 06:00:00 via INTRAVENOUS

## 2012-05-02 NOTE — ED Notes (Signed)
Pt unable to tolerate ambulation effectively. MD notified.

## 2012-05-02 NOTE — Progress Notes (Signed)
Md notified of Bp 91/51 and HR ranging between low 50's and 70s. Will hold metoprolol per MD order this AM.

## 2012-05-02 NOTE — Progress Notes (Signed)
Patient ID: HELYN SCHWAN, female   DOB: 1922/10/31, 79 y.o.   MRN: [id number] Subjective: Patient reports continued discomfort primarily along the lateral aspect of her left hip. She has not had the pain before she fell yesterday morning, metal artifact MRI scans have been accomplished.  Objective: [place] MRI scan of the right hip shows no evidence of stress fracture, no marrow edema, no evidence of damage to the musculature from the metal-on-metal hips. No fluid collection. On the left side which is the newly painful hip, there is some subcutaneous edema and a fluid collection over the greater trochanter 2 x 4 x 8 cm it is consistent with trochanteric bursitis. It is not tract from the metal-on-metal joint. There is no evidence of marrow edema or destruction of the musculature.  Assessment: 56.  79 year old woman with stable metal-on-metal hips no evidence of adverse tissue reaction on [place] MRI scans today. 2. On the left hip there is lateral subcutaneous edema and fluid over the greater trochanter consistent with posttraumatic trochanteric bursitis. I believe this is secondary to the fall she sustained a day before yesterday and it will be treated nonoperatively  Plan: Going forward she is weightbearing as tolerated with a walker, pain medicines as prescribed by her primary care for rod her are reasonable. At age 9 she's been living at home with her brother who is a retired P[employer] consultant, and also has some medical issues. It would be a good time to consider assisted living or nursing home placement for Ms. Ballenger and and this was discussed with her and one of her family members.

## 2012-05-02 NOTE — Progress Notes (Signed)
ANTICOAGULATION CONSULT NOTE - Follow Up Consult  Pharmacy Consult for Coumadin  Indication: atrial fibrillation  Allergies  Allergen Reactions  . Meperidine Hcl Nausea Only  . Oxycodone-Acetaminophen Nausea Only    Patient Measurements: Height: 5\' 6"  (167.6 cm) Weight: 186 lb 11.2 oz (84.687 kg) IBW/kg (Calculated) : 59.3  Vital Signs: Temp: 98.9 F (37.2 C) (03/03 0534) BP: 91/51 mmHg (03/03 1000) Pulse Rate: 58 (03/03 1000)  Labs:  Recent Labs  05/01/12 2312  HGB 13.6  HCT 41.2  PLT 201  APTT 43*  LABPROT 30.7*  INR 3.16*  CREATININE 0.84    Estimated Creatinine Clearance: 51.8 ml/min (by C-G formula based on Cr of 0.84).   Medications:  Scheduled:  . sodium chloride   Intravenous STAT  . metoprolol succinate  75 mg Oral BID  . [COMPLETED]  morphine injection  4 mg Intravenous Once  . omega-3 acid ethyl esters  1 g Oral Daily  . [COMPLETED] ondansetron (ZOFRAN) IV  4 mg Intravenous Once  . potassium chloride SA  20 mEq Oral BID  . sodium chloride  3 mL Intravenous Q12H  . sodium chloride  3 mL Intravenous Q12H  . torsemide  20 mg Oral BID  . torsemide  20 mg Oral BID  . vitamin C  500 mg Oral Daily  . Warfarin - Pharmacist Dosing Inpatient   Does not apply q1800    Assessment: 79 yo female with fall and trouble ambulating. Continue coumadin for atrial fibrillation.  Home dose of coumadin is 9mg  MWF and 6mg  all other days.   Patient admitted with supratherapeutic INR at 3.16. Scr is 0.84 and CrCl 51.32ml/min. No evidence of bleeding.      Goal of Therapy:  INR 2-3 Monitor platelets by anticoagulation protocol: Yes   Plan:  Coumadin 6mg  today  Daily PT/INR    [name] PharmD Candidate 05/02/2012,11:49 AM

## 2012-05-02 NOTE — Progress Notes (Signed)
Lower dose of Coumadin today awaiting decrease in INR.  Do not want to hold today fearing that INR will drop to less than 2  Thank you.  Okey Regal, PharmD

## 2012-05-02 NOTE — Progress Notes (Signed)
[facility] PROGRESS NOTE  [name] ZOX:[id number] DOB: [birth year] DOA: 05/01/2012 PCP: Rene Paci, MD  Brief Narrative: [name] is a 79 y.o. female history of atrial fibrillation diastolic CHF chronic lower extremity edema had a fall at her house yesterday and hurt her left hip. Patient states that she does not know how she fell but did not lose consciousness or hit her head. Since her fall she has been experiencing hip pain in particular left side. In the ER x-rays did not reveal any fracture since the pain is persistent and severe CT of the hip was done which only showed some inflammation changes but no fracture. Since patient's pain is persistent and severe this time patient has been admitted for further observation and management. EKG shows atrial fibrillation with controlled rate and CT head chest x-rays are unremarkable with INR being mildly supratherapeutic. Patient otherwise denies any chest pain shortness of breath nausea vomiting abdominal pain diarrhea or any dizziness. Patient's daughter states that patient uses a walker and she has a poor gait and balance.  Assessment/Plan: 1. Left hip pain status post fall - CT scan did not show any evidence of acute fracture or hardware dislocation. Dr.Rowan is following as well. Plan for MRI today. PT OT saw patient and they recommended skilled nursing facility. 2. Left wrist pain - with swelling, we'll get a plain x-ray. 3. Atrial fibrillation - continue home medications 4. History of heart failure with preserved systolic function - continue diuretics  Code Status: Full Family Communication: Daughter at bedside  Disposition Plan: Skilled nursing facility. Awaiting MRI results for now.  Consultants:  Orthopedics  Procedures:  None  Antibiotics:  None  HPI/Subjective: Complains of soreness in her left hip and her left wrist.  Objective: Filed Vitals:   05/02/12 0400 05/02/12 0430 05/02/12 0500 05/02/12  0534  BP: 129/64 132/63 141/85 146/84  Pulse:  66 64 62  Temp:    98.9 F (37.2 C)  TempSrc:      Resp: 14 13 15 16   Height:    5\' 6"  (1.676 m)  Weight:    84.687 kg (186 lb 11.2 oz)  SpO2: 94% 95% 96% 99%    Intake/Output Summary (Last 24 hours) at 05/02/12 0856 Last data filed at 05/02/12 8119  Gross per 24 hour  Intake    240 ml  Output      0 ml  Net    240 ml   Filed Weights   05/02/12 0534  Weight: 84.687 kg (186 lb 11.2 oz)    Exam:   General:  NAD  Cardiovascular: regular, without MRG  Respiratory: good air movement, clear to auscultation throughout, no wheezing, ronchi or rales  Abdomen: soft, not tender to palpation, positive bowel sounds  MSK: no peripheral edema. Tenderness in the left hip area. [name] appear swollen and mildly erythematous. Tender with movement.  Neuro: Neuro exam nonfocal.  Data Reviewed: Basic Metabolic Panel:  Recent Labs Lab 05/01/12 2312  NA 140  K 4.3  CL 106  CO2 26  GLUCOSE 105*  BUN 26*  CREATININE 0.84  CALCIUM 9.3   Liver Function Tests:  Recent Labs Lab 05/01/12 2312  AST 29  ALT 18  ALKPHOS 76  BILITOT 0.7  PROT 7.0  ALBUMIN 3.5   No results found for this basename: LIPASE, AMYLASE,  in the last 168 hours No results found for this basename: AMMONIA,  in the last 168 hours CBC:  Recent Labs Lab 05/01/12 2312  WBC 8.2  NEUTROABS 5.6  HGB 13.6  HCT 41.2  MCV 92.8  PLT 201   Studies: Dg Chest 1 View  05/02/2012  *RADIOLOGY REPORT*  Clinical Data: Status post fall; left hip pain.  CHEST - 1 VIEW  Comparison: Chest radiograph performed 09/08/2007  Findings: The lungs are well-aerated.  Vascular congestion is noted, with mildly increased interstitial markings, possibly reflecting transient interstitial edema.  There is no evidence of pleural effusion or pneumothorax.  The cardiomediastinal silhouette is mildly enlarged.  No acute osseous abnormalities are seen.  Clips are noted overlying the right  axilla.  Degenerative change is noted at both glenohumeral joints.  IMPRESSION: Vascular congestion and mild cardiomegaly, with mildly increased interstitial markings, possibly reflecting transient interstitial edema.  No displaced rib fractures seen.   Original Report Authenticated By: Tonia Ghent, M.D.    Dg Hip Complete Left  05/02/2012  *RADIOLOGY REPORT*  Clinical Data: Status post fall; left hip pain.  LEFT HIP - COMPLETE 2+ VIEW  Comparison: Left hip radiographs performed 10/20/2004  Findings: There is no evidence of fracture or dislocation. Bilateral femoral prostheses appear grossly intact, though the right-sided prosthesis is incompletely imaged on this study.  Both femoral prostheses are seated normally at their respective acetabula, without evidence of loosening.  The proximal left femur appears intact.  Mild degenerative change is noted at the lower lumbar spine.  The sacroiliac joints are unremarkable in appearance.  Apparent lucency on the pelvis radiograph overlying the femoral prostheses is not characterized on the left hip view, and is likely artifactual in nature.  The visualized bowel gas pattern is grossly unremarkable in appearance.  Scattered phleboliths are noted within the pelvis.  IMPRESSION: No evidence of fracture or dislocation.  Bilateral femoral prostheses appear intact, without evidence of loosening.   Original Report Authenticated By: Tonia Ghent, M.D.    Ct Head Wo Contrast  05/02/2012  *RADIOLOGY REPORT*  Clinical Data: Fall  CT HEAD WITHOUT CONTRAST  Technique:  Contiguous axial images were obtained from the base of the skull through the vertex without contrast.  Comparison: None.  Findings: No intracranial hemorrhage.  No parenchymal contusion. No midline shift or mass effect.  Basilar cisterns are patent. No skull base fracture.  No fluid in the paranasal sinuses or mastoid air cells.  There is mild periventricular white matter hypodensities.  IMPRESSION: No intracranial  trauma.  Atrophy and microvascular disease.   Original Report Authenticated By: Genevive Bi, M.D.    Ct Pelvis Wo Contrast  05/02/2012  *RADIOLOGY REPORT*  Clinical Data:  Status post fall; left hip and groin pain.  Unable to bear weight on left leg.  CT PELVIS AND LEFT LOWER EXTREMITY WITHOUT CONTRAST  Technique:  Multidetector CT imaging of the pelvis and left lower extremity was performed according to the standard protocol without intravenous contrast.  Multiplanar CT image reconstructions were also generated.  Comparison:  Left hip radiographs performed earlier today at 12:00 a.m.  CT PELVIS  Findings:  There is no evidence of fracture or dislocation.  The sacrum appears grossly intact, though an occult fracture cannot be entirely excluded on CT.  The sacroiliac joints are grossly unremarkable in appearance.  The acetabula are difficult to fully characterize but appear grossly intact; evaluation is mildly suboptimal due to metal artifact.  The femoral prostheses are noted in expected alignment, and appear intact bilaterally, without evidence of loosening.  Mild degenerative change is noted at the lower lumbar spine.  There is slightly unusual low  attenuation at the expected location of the uterus; this likely reflects the patient's baseline, given the patient's age.  There is ectasia of the right common iliac artery to 1.6 cm, with mild associated calcification.  The visualized small and large bowel loops are grossly unremarkable in appearance.  The appendix is normal in caliber and contains minimal contrast.  Scattered vascular calcifications are seen.  No significant soft tissue injury is noted.  IMPRESSION:  1.  No evidence of fracture or dislocation. 2.  Bilateral femoral prostheses appear intact, without evidence of loosening. 3.  Ectasia of the right common iliac artery to 1.6 cm, with mild associated calcific atherosclerosis.  CT LEFT LOWER EXTREMITY  Findings: The proximal left femur appears  intact.  There is no evidence of fracture or loosening.  The left femoral prosthesis is seated in expected position.  There is no definite evidence for particle disease.  There is mild osteopenia of visualized osseous structures.  The right femoral prosthesis also appears intact, without evidence of loosening.  There is mild soft tissue injury along the lateral aspect of the left mid thigh, with mild soft tissue inflammation.  The underlying musculature is unremarkable in appearance.  The vasculature is not well assessed without contrast.  Visualized inguinal nodes remain borderline normal in size.  IMPRESSION:  1.  No evidence of fracture or dislocation. 2.  Femoral prostheses appear intact, without evidence of loosening. 3.  Mild osteopenia of visualized osseous structures. 4.  Mild soft tissue injury along the lateral aspect of the left mid thigh, with mild soft tissue inflammation.  Underlying musculature is grossly unremarkable.   Original Report Authenticated By: Tonia Ghent, M.D.    Ct Hip Left Wo Contrast  05/02/2012  *RADIOLOGY REPORT*  Clinical Data:  Status post fall; left hip and groin pain.  Unable to bear weight on left leg.  CT PELVIS AND LEFT LOWER EXTREMITY WITHOUT CONTRAST  Technique:  Multidetector CT imaging of the pelvis and left lower extremity was performed according to the standard protocol without intravenous contrast.  Multiplanar CT image reconstructions were also generated.  Comparison:  Left hip radiographs performed earlier today at 12:00 a.m.  CT PELVIS  Findings:  There is no evidence of fracture or dislocation.  The sacrum appears grossly intact, though an occult fracture cannot be entirely excluded on CT.  The sacroiliac joints are grossly unremarkable in appearance.  The acetabula are difficult to fully characterize but appear grossly intact; evaluation is mildly suboptimal due to metal artifact.  The femoral prostheses are noted in expected alignment, and appear intact  bilaterally, without evidence of loosening.  Mild degenerative change is noted at the lower lumbar spine.  There is slightly unusual low attenuation at the expected location of the uterus; this likely reflects the patient's baseline, given the patient's age.  There is ectasia of the right common iliac artery to 1.6 cm, with mild associated calcification.  The visualized small and large bowel loops are grossly unremarkable in appearance.  The appendix is normal in caliber and contains minimal contrast.  Scattered vascular calcifications are seen.  No significant soft tissue injury is noted.  IMPRESSION:  1.  No evidence of fracture or dislocation. 2.  Bilateral femoral prostheses appear intact, without evidence of loosening. 3.  Ectasia of the right common iliac artery to 1.6 cm, with mild associated calcific atherosclerosis.  CT LEFT LOWER EXTREMITY  Findings: The proximal left femur appears intact.  There is no evidence of fracture or loosening.  The left femoral prosthesis is seated in expected position.  There is no definite evidence for particle disease.  There is mild osteopenia of visualized osseous structures.  The right femoral prosthesis also appears intact, without evidence of loosening.  There is mild soft tissue injury along the lateral aspect of the left mid thigh, with mild soft tissue inflammation.  The underlying musculature is unremarkable in appearance.  The vasculature is not well assessed without contrast.  Visualized inguinal nodes remain borderline normal in size.  IMPRESSION:  1.  No evidence of fracture or dislocation. 2.  Femoral prostheses appear intact, without evidence of loosening. 3.  Mild osteopenia of visualized osseous structures. 4.  Mild soft tissue injury along the lateral aspect of the left mid thigh, with mild soft tissue inflammation.  Underlying musculature is grossly unremarkable.   Original Report Authenticated By: Tonia Ghent, M.D.    Dg Knee Complete 4 Views  Left  05/02/2012  *RADIOLOGY REPORT*  Clinical Data: Fall, knee pain  LEFT KNEE - COMPLETE 4+ VIEW  Comparison: none  Findings: There is no evidence of acute fracture of the left knee. There is severe joint space narrowing of the lateral compartment. There is osteophytosis of the patellofemoral compartment.  No joint effusion.  IMPRESSION: 1.  No acute fracture of the left knee. 2.  Severe osteoarthritis of the lateral compartment.   Original Report Authenticated By: Genevive Bi, M.D.     Scheduled Meds: . sodium chloride   Intravenous STAT  . metoprolol succinate  75 mg Oral BID  . omega-3 acid ethyl esters  1 g Oral Daily  . potassium chloride SA  20 mEq Oral BID  . sodium chloride  3 mL Intravenous Q12H  . sodium chloride  3 mL Intravenous Q12H  . torsemide  20 mg Oral BID  . torsemide  20 mg Oral BID  . vitamin C  500 mg Oral Daily  . Warfarin - Pharmacist Dosing Inpatient   Does not apply q1800   Continuous Infusions:   Principal Problem:   Hip pain Active Problems:   HYPERTENSION   Atrial fibrillation   Shelly Bombard, MD [facility] Pager [phone]. If 7 PM - 7 AM, please contact night-coverage at [email or web address], password [facility] 05/02/2012, 8:56 AM  LOS: 1 day

## 2012-05-02 NOTE — Progress Notes (Signed)
Patient ID: [name], female   DOB: [birth year], 79 y.o.   MRN: [id number] Subjective: Patient known to me for the last 15 years status post left total hip in 2008, right total hip in 2010. Both devices are DePuy ASR hips and are recalled, workup has included plain x-rays, metal ion levels, CT scan, bone scan, and most recently metal artifact reduction MRI scans which have not showed any evidence of pseudotumors or fluid collections. Last x-rays in my office for one year ago and were unremarkable. Patient fell at home at 3 AM on 05/01/2012 with pain in the left hip and thigh of new onset. The right total hip as does have some mild groin pain. She is brought to the emergency room at 2100 hours and plain x-rays were unremarkable, as were CT scans personally reviewed. She is been admitted to the medicine service for pain control.  Objective: Surgical wounds to both hips are healed no erythema no swelling. She is tender along the lateral aspect of the left hip and thigh consistent with a contusion, she thinks she may have actually fallen onto her left side. She has bilateral lower extremity pitting edema that is chronic and consistent with atrial fibrillation and congestive heart failure. She can move her toes up and down without difficulty.  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 Fall yesterday with new onset left lateral hip and thigh pain most consistent with contusion in light of negative imaging studies.  Plan: To complete her workup I would like to go ahead and get metal artifact reduction MRI scans of both hips to make sure there is no evidence of new pseudotumor or fluid collections. This patient has had some progressive weakness, she is now 79 years old, it is very likely she will need to be placed in a nursing home situation.

## 2012-05-02 NOTE — Progress Notes (Signed)
ANTICOAGULATION CONSULT NOTE - Initial Consult  Pharmacy Consult for heparin Indication: atrial fibrillation  Allergies  Allergen Reactions  . Meperidine Hcl Nausea Only  . Oxycodone-Acetaminophen Nausea Only    Vital Signs: Temp: 98.3 F (36.8 C) (03/02 2323) Temp src: Oral (03/02 2323) BP: 141/85 mmHg (03/03 0500) Pulse Rate: 64 (03/03 0500)  Labs:  Recent Labs  05/01/12 2312  HGB 13.6  HCT 41.2  PLT 201  APTT 43*  LABPROT 30.7*  INR 3.16*  CREATININE 0.84    Medical History: Past Medical History  Diagnosis Date  . CARCINOMA, BREAST   . HYPERTENSION   . Atrial fibrillation   . Unspecified diastolic heart failure   . DEGENERATIVE JOINT DISEASE   . Edema   . CHEST PAIN   . Long term (current) use of anticoagulants     Medications:  Prescriptions prior to admission  Medication Sig Dispense Refill  . calcium-vitamin D (OSCAL WITH D) 500-200 MG-UNIT per tablet Take 1 tablet by mouth daily.      . cholecalciferol (VITAMIN D) 1000 UNITS tablet Take 1,000 Units by mouth daily.      Marland Kitchen HYDROcodone-acetaminophen (NORCO) 5-325 MG per tablet Take 1 tablet by mouth 2 (two) times daily as needed for pain. prn      . metoprolol succinate (TOPROL-XL) 50 MG 24 hr tablet Take 75 mg by mouth 2 (two) times daily. Take with or immediately following a meal.      . Multiple Vitamins-Minerals (CENTRUM PO) Take 1 tablet by mouth daily.      . Omega-3 Fatty Acids (FISH OIL PO) Take 1 tablet by mouth daily.      . potassium chloride SA (K-DUR,KLOR-CON) 20 MEQ tablet Take 1 tablet (20 mEq total) by mouth 2 (two) times daily.  60 tablet  12  . torsemide (DEMADEX) 20 MG tablet Take 20 mg by mouth 2 (two) times daily.      . vitamin C (ASCORBIC ACID) 500 MG tablet Take 500 mg by mouth daily.      Marland Kitchen warfarin (COUMADIN) 6 MG tablet Take 6-9 mg by mouth See admin instructions. Takes 1.5 tablets Mon,Wed and Fri.  Takes 1 tablet Sun,Tues,Thurs and sat       Scheduled:  . sodium chloride    Intravenous STAT  . metoprolol succinate  75 mg Oral BID  . [COMPLETED]  morphine injection  4 mg Intravenous Once  . omega-3 acid ethyl esters  1 g Oral Daily  . [COMPLETED] ondansetron (ZOFRAN) IV  4 mg Intravenous Once  . potassium chloride SA  20 mEq Oral BID  . sodium chloride  3 mL Intravenous Q12H  . sodium chloride  3 mL Intravenous Q12H  . torsemide  20 mg Oral BID  . torsemide  20 mg Oral BID  . vitamin C  500 mg Oral Daily    Assessment: 79yo female c/o sudden fall, now with difficulty ambulating, CT reveals no acute issues of hip or head, to continue Coumadin for Afib; admitted with supratherapeutic INR.  Goal of Therapy:  INR 2-3   Plan:  Will hold Coumadin today and monitor INR to resume and adjust doses.  Vernard Gambles, PharmD, BCPS  05/02/2012,6:05 AM

## 2012-05-02 NOTE — H&P (Signed)
[facility] History and Physical  [name] ZOX:[id number] DOB: December 23, 1922 DOA: 05/01/2012  Referring physician: Dr. Lynelle Doctor. PCP: Rene Paci, MD  Specialists: Dr.Nishan Cardiologist.  Chief Complaint: Fall with left hip pain.  HPI: [name] is a 79 y.o. female history of atrial fibrillation diastolic CHF chronic lower extremity edema had a fall at her house yesterday and hurt her left hip. Patient states that she does not know how she fell but did not lose consciousness or hit her head. Since her fall she has been experiencing hip pain in particular left side. In the ER x-rays did not reveal any fracture since the pain is persistent and severe CT of the hip was done which only showed  some inflammation changes but no fracture. Since patient's pain is persistent and severe this time patient has been admitted for further observation and management. EKG shows atrial fibrillation with controlled rate and CT head chest x-rays are unremarkable with INR being mildly supratherapeutic. Patient otherwise denies any chest pain shortness of breath nausea vomiting abdominal pain diarrhea or any dizziness. Patient's daughter states that patient uses a walker and she has a poor gait and balance.  Review of Systems: As presented in the history of presenting illness nothing else significant.  Past Medical History  Diagnosis Date  . CARCINOMA, BREAST   . HYPERTENSION   . Atrial fibrillation   . Unspecified diastolic heart failure   . DEGENERATIVE JOINT DISEASE   . Edema   . CHEST PAIN   . Long term (current) use of anticoagulants    Past Surgical History  Procedure Laterality Date  . Knee surgery    . Hip surgery    . Breast lumpectomy    . Tonsillectomy    . Laminectomy     Social History:  reports that she has never smoked. She does not have any smokeless tobacco history on file. She reports that she does not drink alcohol or use illicit drugs.  lives with her family. In  her home. where does patient live--home, ALF, SNF? and with whom if at home? Can do ADLs. Can patient participate in ADLs?  Allergies  Allergen Reactions  . Meperidine Hcl Nausea Only  . Oxycodone-Acetaminophen Nausea Only    Family History  Problem Relation Age of Onset  . CAD Mother   . CAD Father   . Stroke Brother       Prior to Admission medications   Medication Sig Start Date End Date Taking? Authorizing Provider  calcium-vitamin D (OSCAL WITH D) 500-200 MG-UNIT per tablet Take 1 tablet by mouth daily.   Yes Historical Provider, MD  cholecalciferol (VITAMIN D) 1000 UNITS tablet Take 1,000 Units by mouth daily.   Yes Historical Provider, MD  HYDROcodone-acetaminophen (NORCO) 5-325 MG per tablet Take 1 tablet by mouth 2 (two) times daily as needed for pain. prn 04/17/11  Yes Historical Provider, MD  metoprolol succinate (TOPROL-XL) 50 MG 24 hr tablet Take 75 mg by mouth 2 (two) times daily. Take with or immediately following a meal.   Yes Historical Provider, MD  Multiple Vitamins-Minerals (CENTRUM PO) Take 1 tablet by mouth daily.   Yes Historical Provider, MD  Omega-3 Fatty Acids (FISH OIL PO) Take 1 tablet by mouth daily.   Yes Historical Provider, MD  potassium chloride SA (K-DUR,KLOR-CON) 20 MEQ tablet Take 1 tablet (20 mEq total) by mouth 2 (two) times daily. 02/11/12  Yes Wendall Stade, MD  torsemide (DEMADEX) 20 MG tablet Take 20 mg  by mouth 2 (two) times daily. 06/30/11  Yes Wendall Stade, MD  vitamin C (ASCORBIC ACID) 500 MG tablet Take 500 mg by mouth daily.   Yes Historical Provider, MD  warfarin (COUMADIN) 6 MG tablet Take 6-9 mg by mouth See admin instructions. Takes 1.5 tablets Mon,Wed and Fri.  Takes 1 tablet Sun,Tues,Thurs and sat 03/15/12  Yes Wendall Stade, MD   Physical Exam: Filed Vitals:   05/02/12 0330 05/02/12 0400 05/02/12 0430 05/02/12 0500  BP: 155/75 129/64 132/63 141/85  Pulse: 59  66 64  Temp:      TempSrc:      Resp: 11 14 13 15   SpO2: 99% 94%  95% 96%     General:  Well-built and nourished.  Eyes: Anicteric no pallor.  ENT: No discharge from the ears eyes nose or mouth.  Neck: No mass felt.  Cardiovascular: S1-S2 heard.  Respiratory: No rhonchi no crepitations.  Abdomen: Soft nontender bowel sounds present.  Skin: No rash.  Musculoskeletal: Pain on moving the left hip. Mild tenderness of the left hip area.  Psychiatric: Appears normal.  Neurologic: Motor lower extremities.  Labs on Admission:  Basic Metabolic Panel:  Recent Labs Lab 05/01/12 2312  NA 140  K 4.3  CL 106  CO2 26  GLUCOSE 105*  BUN 26*  CREATININE 0.84  CALCIUM 9.3   Liver Function Tests:  Recent Labs Lab 05/01/12 2312  AST 29  ALT 18  ALKPHOS 76  BILITOT 0.7  PROT 7.0  ALBUMIN 3.5   No results found for this basename: LIPASE, AMYLASE,  in the last 168 hours No results found for this basename: AMMONIA,  in the last 168 hours CBC:  Recent Labs Lab 05/01/12 2312  WBC 8.2  NEUTROABS 5.6  HGB 13.6  HCT 41.2  MCV 92.8  PLT 201   Cardiac Enzymes: No results found for this basename: CKTOTAL, CKMB, CKMBINDEX, TROPONINI,  in the last 168 hours  BNP (last 3 results) No results found for this basename: PROBNP,  in the last 8760 hours CBG: No results found for this basename: GLUCAP,  in the last 168 hours  Radiological Exams on Admission: Dg Chest 1 View  05/02/2012  *RADIOLOGY REPORT*  Clinical Data: Status post fall; left hip pain.  CHEST - 1 VIEW  Comparison: Chest radiograph performed 09/08/2007  Findings: The lungs are well-aerated.  Vascular congestion is noted, with mildly increased interstitial markings, possibly reflecting transient interstitial edema.  There is no evidence of pleural effusion or pneumothorax.  The cardiomediastinal silhouette is mildly enlarged.  No acute osseous abnormalities are seen.  Clips are noted overlying the right axilla.  Degenerative change is noted at both glenohumeral joints.  IMPRESSION:  Vascular congestion and mild cardiomegaly, with mildly increased interstitial markings, possibly reflecting transient interstitial edema.  No displaced rib fractures seen.   Original Report Authenticated By: Tonia Ghent, M.D.    Dg Hip Complete Left  05/02/2012  *RADIOLOGY REPORT*  Clinical Data: Status post fall; left hip pain.  LEFT HIP - COMPLETE 2+ VIEW  Comparison: Left hip radiographs performed 10/20/2004  Findings: There is no evidence of fracture or dislocation. Bilateral femoral prostheses appear grossly intact, though the right-sided prosthesis is incompletely imaged on this study.  Both femoral prostheses are seated normally at their respective acetabula, without evidence of loosening.  The proximal left femur appears intact.  Mild degenerative change is noted at the lower lumbar spine.  The sacroiliac joints are unremarkable in appearance.  Apparent lucency on the pelvis radiograph overlying the femoral prostheses is not characterized on the left hip view, and is likely artifactual in nature.  The visualized bowel gas pattern is grossly unremarkable in appearance.  Scattered phleboliths are noted within the pelvis.  IMPRESSION: No evidence of fracture or dislocation.  Bilateral femoral prostheses appear intact, without evidence of loosening.   Original Report Authenticated By: Tonia Ghent, M.D.    Ct Head Wo Contrast  05/02/2012  *RADIOLOGY REPORT*  Clinical Data: Fall  CT HEAD WITHOUT CONTRAST  Technique:  Contiguous axial images were obtained from the base of the skull through the vertex without contrast.  Comparison: None.  Findings: No intracranial hemorrhage.  No parenchymal contusion. No midline shift or mass effect.  Basilar cisterns are patent. No skull base fracture.  No fluid in the paranasal sinuses or mastoid air cells.  There is mild periventricular white matter hypodensities.  IMPRESSION: No intracranial trauma.  Atrophy and microvascular disease.   Original Report Authenticated By:  Genevive Bi, M.D.    Ct Pelvis Wo Contrast  05/02/2012  *RADIOLOGY REPORT*  Clinical Data:  Status post fall; left hip and groin pain.  Unable to bear weight on left leg.  CT PELVIS AND LEFT LOWER EXTREMITY WITHOUT CONTRAST  Technique:  Multidetector CT imaging of the pelvis and left lower extremity was performed according to the standard protocol without intravenous contrast.  Multiplanar CT image reconstructions were also generated.  Comparison:  Left hip radiographs performed earlier today at 12:00 a.m.  CT PELVIS  Findings:  There is no evidence of fracture or dislocation.  The sacrum appears grossly intact, though an occult fracture cannot be entirely excluded on CT.  The sacroiliac joints are grossly unremarkable in appearance.  The acetabula are difficult to fully characterize but appear grossly intact; evaluation is mildly suboptimal due to metal artifact.  The femoral prostheses are noted in expected alignment, and appear intact bilaterally, without evidence of loosening.  Mild degenerative change is noted at the lower lumbar spine.  There is slightly unusual low attenuation at the expected location of the uterus; this likely reflects the patient's baseline, given the patient's age.  There is ectasia of the right common iliac artery to 1.6 cm, with mild associated calcification.  The visualized small and large bowel loops are grossly unremarkable in appearance.  The appendix is normal in caliber and contains minimal contrast.  Scattered vascular calcifications are seen.  No significant soft tissue injury is noted.  IMPRESSION:  1.  No evidence of fracture or dislocation. 2.  Bilateral femoral prostheses appear intact, without evidence of loosening. 3.  Ectasia of the right common iliac artery to 1.6 cm, with mild associated calcific atherosclerosis.  CT LEFT LOWER EXTREMITY  Findings: The proximal left femur appears intact.  There is no evidence of fracture or loosening.  The left femoral prosthesis  is seated in expected position.  There is no definite evidence for particle disease.  There is mild osteopenia of visualized osseous structures.  The right femoral prosthesis also appears intact, without evidence of loosening.  There is mild soft tissue injury along the lateral aspect of the left mid thigh, with mild soft tissue inflammation.  The underlying musculature is unremarkable in appearance.  The vasculature is not well assessed without contrast.  Visualized inguinal nodes remain borderline normal in size.  IMPRESSION:  1.  No evidence of fracture or dislocation. 2.  Femoral prostheses appear intact, without evidence of loosening. 3.  Mild osteopenia  of visualized osseous structures. 4.  Mild soft tissue injury along the lateral aspect of the left mid thigh, with mild soft tissue inflammation.  Underlying musculature is grossly unremarkable.   Original Report Authenticated By: Tonia Ghent, M.D.    Ct Hip Left Wo Contrast  05/02/2012  *RADIOLOGY REPORT*  Clinical Data:  Status post fall; left hip and groin pain.  Unable to bear weight on left leg.  CT PELVIS AND LEFT LOWER EXTREMITY WITHOUT CONTRAST  Technique:  Multidetector CT imaging of the pelvis and left lower extremity was performed according to the standard protocol without intravenous contrast.  Multiplanar CT image reconstructions were also generated.  Comparison:  Left hip radiographs performed earlier today at 12:00 a.m.  CT PELVIS  Findings:  There is no evidence of fracture or dislocation.  The sacrum appears grossly intact, though an occult fracture cannot be entirely excluded on CT.  The sacroiliac joints are grossly unremarkable in appearance.  The acetabula are difficult to fully characterize but appear grossly intact; evaluation is mildly suboptimal due to metal artifact.  The femoral prostheses are noted in expected alignment, and appear intact bilaterally, without evidence of loosening.  Mild degenerative change is noted at the lower  lumbar spine.  There is slightly unusual low attenuation at the expected location of the uterus; this likely reflects the patient's baseline, given the patient's age.  There is ectasia of the right common iliac artery to 1.6 cm, with mild associated calcification.  The visualized small and large bowel loops are grossly unremarkable in appearance.  The appendix is normal in caliber and contains minimal contrast.  Scattered vascular calcifications are seen.  No significant soft tissue injury is noted.  IMPRESSION:  1.  No evidence of fracture or dislocation. 2.  Bilateral femoral prostheses appear intact, without evidence of loosening. 3.  Ectasia of the right common iliac artery to 1.6 cm, with mild associated calcific atherosclerosis.  CT LEFT LOWER EXTREMITY  Findings: The proximal left femur appears intact.  There is no evidence of fracture or loosening.  The left femoral prosthesis is seated in expected position.  There is no definite evidence for particle disease.  There is mild osteopenia of visualized osseous structures.  The right femoral prosthesis also appears intact, without evidence of loosening.  There is mild soft tissue injury along the lateral aspect of the left mid thigh, with mild soft tissue inflammation.  The underlying musculature is unremarkable in appearance.  The vasculature is not well assessed without contrast.  Visualized inguinal nodes remain borderline normal in size.  IMPRESSION:  1.  No evidence of fracture or dislocation. 2.  Femoral prostheses appear intact, without evidence of loosening. 3.  Mild osteopenia of visualized osseous structures. 4.  Mild soft tissue injury along the lateral aspect of the left mid thigh, with mild soft tissue inflammation.  Underlying musculature is grossly unremarkable.   Original Report Authenticated By: Tonia Ghent, M.D.    Dg Knee Complete 4 Views Left  05/02/2012  *RADIOLOGY REPORT*  Clinical Data: Fall, knee pain  LEFT KNEE - COMPLETE 4+ VIEW   Comparison: none  Findings: There is no evidence of acute fracture of the left knee. There is severe joint space narrowing of the lateral compartment. There is osteophytosis of the patellofemoral compartment.  No joint effusion.  IMPRESSION: 1.  No acute fracture of the left knee. 2.  Severe osteoarthritis of the lateral compartment.   Original Report Authenticated By: Genevive Bi, M.D.  EKG: Independently reviewed. A. fib with controlled rate.  Assessment/Plan Principal Problem:   Hip pain Active Problems:   HYPERTENSION   Atrial fibrillation   Fall   1. Left hip pain status post fall - patient has been placed on pain relief medications and have requested physical therapy consult. I have ordered nurses to observe if there is any hematoma buildup in that area as patient is on Coumadin. If pain persist or gets severe then we get MRI or consult orthopedic. 2. Fall - patient does not remember what made her fall probably balance issues. Given her cardiac history we will monitor in telemetry to rule out any arrhythmias. Check d-dimer given the lower extremity edema. 3. Atrial fibrillation rate controlled -  Continue present rate limiting medications and Coumadin per pharmacy. 4. Coagulopathy secondary to Coumadin - mildly supratherapeutic INR Coumadin to be dosed by pharmacy. 5. Chronic lower extremity edema - reviewing patient's chart patient does have chronic edema. Given the fall I ordered d-dimer. Continue diuretics. 6. History of CHF diastolic presently compensated - continue diuretics.   Code Status: Full code. Family Communication: Patient's family at the bedside.  Disposition Plan: Admit for observation.  Marvens Hollars N. [facility] Pager [phone].  If 7PM-7AM, please contact night-coverage [email or web address] Password [email or web address] 05/02/2012, 5:12 AM

## 2012-05-02 NOTE — Evaluation (Signed)
Physical Therapy Evaluation Patient Details Name: [name] MRN: [id number] DOB: 1922/03/31 Today's Date: 05/02/2012 Time: 0822-0855 PT Time Calculation (min): 33 min  PT Assessment / Plan / Recommendation Clinical Impression  pt presents with Fall and L sided hip and wrist pain along with other chronic pains.  Per pt and friend, it sounds like pt has not been very safe living alone at home.  pt reports near falls and having to catch herself on her 4WW.  pt and friend also note that pt's bathroom is not accessible and that pt has a hard time fitting into bathroom with RW.  pt would benefit from SNF at D/C in order to have 24/7 care.      PT Assessment  Patient needs continued PT services    Follow Up Recommendations  SNF    Does the patient have the potential to tolerate intense rehabilitation      Barriers to Discharge Decreased caregiver support;Inaccessible home environment      Equipment Recommendations  None recommended by PT    Recommendations for Other Services OT consult   Frequency Min 3X/week    Precautions / Restrictions Precautions Precautions: Fall Restrictions Weight Bearing Restrictions: No   Pertinent Vitals/Pain Did not rate, but indicates pain in Bil hips, L knee, L shoulder, L wrist.  Premedicated earlier this am.        Mobility  Bed Mobility Bed Mobility: Supine to Sit;Sitting - Scoot to Edge of Bed;Sit to Supine Supine to Sit: 2: Max assist;With rails Sitting - Scoot to [place] of Bed: 3: Mod assist Sit to Supine: 1: +2 Total assist Sit to Supine: Patient Percentage: 40% Details for Bed Mobility Assistance: cues for sequencing, safe technique, encouragement.   Transfers Transfers: Sit to Stand;Stand to Sit Sit to Stand: 1: +2 Total assist;With upper extremity assist;From bed Sit to Stand: Patient Percentage: 40% Stand to Sit: 1: +2 Total assist;With upper extremity assist;To bed Stand to Sit: Patient Percentage: 40% Details for Transfer  Assistance: cues for use of UEs, upright posture, controlling descent to bed.   Ambulation/Gait Ambulation/Gait Assistance: Not tested (comment) Stairs: No Wheelchair Mobility Wheelchair Mobility: No    Exercises     PT Diagnosis: Difficulty walking;Acute pain  PT Problem List: Decreased strength;Decreased activity tolerance;Decreased balance;Decreased mobility;Decreased knowledge of use of DME;Pain PT Treatment Interventions: DME instruction;Gait training;Stair training;Functional mobility training;Therapeutic activities;Therapeutic exercise;Balance training;Patient/family education   PT Goals Acute Rehab PT Goals PT Goal Formulation: With patient Time For Goal Achievement: 05/16/12 Potential to Achieve Goals: Fair Pt will go Supine/Side to Sit: with supervision PT Goal: Supine/Side to Sit - Progress: Goal set today Pt will go Sit to Supine/Side: with supervision PT Goal: Sit to Supine/Side - Progress: Goal set today Pt will go Sit to Stand: with supervision PT Goal: Sit to Stand - Progress: Goal set today Pt will go Stand to Sit: with supervision PT Goal: Stand to Sit - Progress: Goal set today Pt will Ambulate: 51 - 150 feet;with min assist;with rolling walker PT Goal: Ambulate - Progress: Goal set today Pt will Go Up / Down Stairs: 3-5 stairs;with min assist;with rolling walker PT Goal: Up/Down Stairs - Progress: Goal set today  Visit Information  Last PT Received On: 05/02/12 Assistance Needed: +2    Subjective Data  Subjective: I normally make do the best I can.   Patient Stated Goal: Move without pain.     Prior Functioning  Home Living Lives With: Alone Available Help at Discharge: Family;Friend(s);Available PRN/intermittently (Someone  visits pt daily, but unable to provide 24hr care.  ) Type of Home: House Home Access: Stairs to enter [employer] of Steps: 3 Entrance Stairs-Rails: None Home Layout: One level Bathroom Shower/Tub: Teacher, music: Standard Bathroom Accessibility: No Home Adaptive Equipment: Quad cane;Straight cane;Walker - rolling;Walker - four wheeled (Lift chair) Prior Function Level of Independence: Needs assistance Needs Assistance: Light Housekeeping;Meal Prep Meal Prep: Maximal Light Housekeeping: Total Able to Take Stairs?: Yes (with A only) Driving: No Vocation: Retired Comments: Per pt and Friend/CG (Jan) pt has been having difficulties at home for some time and should have more A at home than she has had.   Communication Communication: No difficulties    Cognition  Cognition Overall Cognitive Status: Appears within functional limits for tasks assessed/performed Arousal/Alertness: Awake/alert Orientation Level: Appears intact for tasks assessed Behavior During Session: [facility] for tasks performed    Extremity/Trunk Assessment Right Lower Extremity Assessment RLE ROM/Strength/Tone: Deficits RLE ROM/Strength/Tone Deficits: Generally weak and debilitated.  Painful with hip/knee mobility.   RLE Sensation: WFL - Light Touch Left Lower Extremity Assessment LLE ROM/Strength/Tone: Deficits LLE ROM/Strength/Tone Deficits: Generally weak and debilitated.  Hip/knee limited by pain.   LLE Sensation: WFL - Light Touch Trunk Assessment Trunk Assessment: Kyphotic   Balance Balance Balance Assessed: Yes Static Standing Balance Static Standing - Balance Support: Bilateral upper extremity supported;During functional activity Static Standing - Level of Assistance: 1: +2 Total assist Static Standing - Comment/# of Minutes: pt 70% during static stand.  pt attempts to wt shift on to L LE, however too painful to WB at this time.    End of Session PT - End of Session Equipment Utilized During Treatment: Gait belt;Oxygen Activity Tolerance: Patient limited by pain Patient left: in bed;with call bell/phone within reach;with family/visitor present Nurse Communication: Mobility status  GP Functional  Assessment Tool Used: Clincial Judgement Functional Limitation: Mobility: Walking and moving around Mobility: Walking and Moving Around Current Status ([id number]): At least 60 percent but less than 80 percent impaired, limited or restricted Mobility: Walking and Moving Around Goal Status [phone]): At least 20 percent but less than 40 percent impaired, limited or restricted   Sunny Schlein, [place] [phone] 05/02/2012, 9:13 AM

## 2012-05-03 DIAGNOSIS — Z7901 Long term (current) use of anticoagulants: Secondary | ICD-10-CM

## 2012-05-03 LAB — BASIC METABOLIC PANEL
BUN: 25 mg/dL — ABNORMAL HIGH (ref 6–23)
CO2: 27 mEq/L (ref 19–32)
Calcium: 8.8 mg/dL (ref 8.4–10.5)
Chloride: 101 mEq/L (ref 96–112)
Creatinine, Ser: 0.93 mg/dL (ref 0.50–1.10)
GFR calc Af Amer: 62 mL/min — ABNORMAL LOW (ref >90)
GFR calc non Af Amer: 54 mL/min — ABNORMAL LOW (ref >90)
Glucose, Bld: 82 mg/dL (ref 70–99)
Potassium: 4.1 mEq/L (ref 3.5–5.1)
Sodium: 139 mEq/L (ref 135–145)

## 2012-05-03 LAB — CBC
HCT: 38.2 % (ref 36.0–46.0)
Hemoglobin: 13.3 g/dL (ref 12.0–15.0)
MCH: 31.3 pg (ref 26.0–34.0)
MCHC: 34.8 g/dL (ref 30.0–36.0)
MCV: 89.9 fL (ref 78.0–100.0)
Platelets: 143 10*3/uL — ABNORMAL LOW (ref 150–400)
RBC: 4.25 MIL/uL (ref 3.87–5.11)
RDW: 14 % (ref 11.5–15.5)
WBC: 6.8 10*3/uL (ref 4.0–10.5)

## 2012-05-03 LAB — PROTIME-INR
INR: 3.44 — ABNORMAL HIGH (ref 0.00–1.49)
Prothrombin Time: 32.7 seconds — ABNORMAL HIGH (ref 11.6–15.2)

## 2012-05-03 LAB — GLUCOSE, CAPILLARY: Glucose-Capillary: 85 mg/dL (ref 70–99)

## 2012-05-03 MED ORDER — METOPROLOL SUCCINATE ER 50 MG PO TB24
75.0000 mg | ORAL_TABLET | Freq: Two times a day (BID) | ORAL | Status: DC
  Administered 2012-05-03 – 2012-05-05 (×3): 75 mg via ORAL
  Filled 2012-05-03 (×6): qty 1

## 2012-05-03 NOTE — Progress Notes (Signed)
Physical Therapy Treatment Patient Details Name: [name] MRN: [id number] DOB: 1922/07/01 Today's Date: 05/03/2012 Time: 4098-1191 PT Time Calculation (min): 20 min  PT Assessment / Plan / Recommendation Comments on Treatment Session  Pt continues ltd by pain primarily on left side and including shoulder, wrist and hip    Follow Up Recommendations  SNF     Does the patient have the potential to tolerate intense rehabilitation     Barriers to Discharge        Equipment Recommendations  None recommended by PT    Recommendations for Other Services OT consult  Frequency Min 3X/week   Plan Discharge plan remains appropriate    Precautions / Restrictions Precautions Precautions: Fall Restrictions Weight Bearing Restrictions: No   Pertinent Vitals/Pain 8/10; premed, RN aware    Mobility  Bed Mobility Bed Mobility: Sit to Supine Sit to Supine: 1: +2 Total assist Sit to Supine: Patient Percentage: 40% Details for Bed Mobility Assistance: cues for sequencing, safe technique, encouragement.   Transfers Transfers: Sit to Stand;Stand to Sit Sit to Stand: 1: +2 Total assist;From chair/3-in-1;With armrests Sit to Stand: Patient Percentage: 50% Stand to Sit: 1: +2 Total assist;To bed;With upper extremity assist Stand to Sit: Patient Percentage: 60% Details for Transfer Assistance: cues for use of UEs, upright posture, controlling descent to bed.   Ambulation/Gait Ambulation/Gait Assistance: 1: +2 Total assist Ambulation/Gait: Patient Percentage: 60% Ambulation Distance (Feet): 1 Feet Assistive device: Rolling walker Ambulation/Gait Assistance Details: cues for posture, sequence and position from RW - bed pulled in behind pt when unable to move further Gait Pattern: Step-to pattern;Decreased step length - right;Decreased step length - left;Decreased stance time - left Wheelchair Mobility Wheelchair Mobility: No    Exercises     PT Diagnosis:    PT Problem List:   PT  Treatment Interventions:     PT Goals Acute Rehab PT Goals PT Goal Formulation: With patient Time For Goal Achievement: 05/16/12 Potential to Achieve Goals: Fair Pt will go Supine/Side to Sit: with supervision PT Goal: Supine/Side to Sit - Progress: Progressing toward goal Pt will go Sit to Supine/Side: with supervision PT Goal: Sit to Supine/Side - Progress: Progressing toward goal Pt will go Sit to Stand: with supervision PT Goal: Sit to Stand - Progress: Progressing toward goal Pt will go Stand to Sit: with supervision PT Goal: Stand to Sit - Progress: Progressing toward goal Pt will Ambulate: 51 - 150 feet;with min assist;with rolling walker PT Goal: Ambulate - Progress: Progressing toward goal  Visit Information  Last PT Received On: 05/03/12 Assistance Needed: +2    Subjective Data  Patient Stated Goal: Move without pain.     Cognition  Cognition Overall Cognitive Status: Appears within functional limits for tasks assessed/performed Arousal/Alertness: Awake/alert Orientation Level: Appears intact for tasks assessed Behavior During Session: [facility] for tasks performed    Balance     End of Session PT - End of Session Equipment Utilized During Treatment: Gait belt;Oxygen Activity Tolerance: Patient limited by pain Patient left: in bed;with call bell/phone within reach;with family/visitor present Nurse Communication: Mobility status   GP     Kiet Geer 05/03/2012, 4:12 PM

## 2012-05-03 NOTE — Progress Notes (Signed)
ANTICOAGULATION CONSULT NOTE - Follow Up Consult  Pharmacy Consult for Coumadin Indication: atrial fibrillation  Allergies  Allergen Reactions  . Meperidine Hcl Nausea Only  . Oxycodone-Acetaminophen Nausea Only    Patient Measurements: Height: 5\' 6"  (167.6 cm) Weight: 176 lb 11.2 oz (80.151 kg) IBW/kg (Calculated) : 59.3  Vital Signs: Temp: 97.8 F (36.6 C) (03/04 0446) BP: 140/53 mmHg (03/04 0446) Pulse Rate: 65 (03/04 0446)  Labs:  Recent Labs  05/01/12 2312 05/02/12 1157 05/03/12 0600  HGB 13.6  --  13.3  HCT 41.2  --  38.2  PLT 201  --  143*  APTT 43*  --   --   LABPROT 30.7*  --  32.7*  INR 3.16*  --  3.44*  CREATININE 0.84  --  0.93  TROPONINI  --  <0.30  --     Estimated Creatinine Clearance: 45.5 ml/min (by C-G formula based on Cr of 0.93).   Medications:  Scheduled:  . [COMPLETED] sodium chloride   Intravenous STAT  . cephALEXin  500 mg Oral Q12H  . metoprolol succinate  75 mg Oral BID  . omega-3 acid ethyl esters  1 g Oral Daily  . potassium chloride SA  20 mEq Oral BID  . sodium chloride  3 mL Intravenous Q12H  . sodium chloride  3 mL Intravenous Q12H  . torsemide  20 mg Oral BID  . vitamin C  500 mg Oral Daily  . [COMPLETED] warfarin  6 mg Oral ONCE-1800  . Warfarin - Pharmacist Dosing Inpatient   Does not apply q1800  . [DISCONTINUED] metoprolol succinate  75 mg Oral BID  . [DISCONTINUED] torsemide  20 mg Oral BID    Assessment: 79 yo female with fall and trouble ambulating.  Will continue her on coumadin for atrial fibrillation.  Home dose of coumadin is 9mg  MWF and 6mg  all other days.   INR today remains supratherapeutic at 3.44.  SCr 0.93 and CrCl 45.30ml/min.  No evidence of bleeding at this time.  Because patients INR is continuing to climb despite lower dose of coumadin yesterday, will hold dose today and follow-up INR tomorrow.    Goal of Therapy:  INR 2-3 Monitor platelets by anticoagulation protocol: Yes   Plan:  Hold  Coumadin today  Daily PT/INR   Micheline Chapman PharmD Candidate 05/03/2012,11:33 AM

## 2012-05-03 NOTE — Clinical Social Work Placement (Addendum)
Clinical Social Work Department CLINICAL SOCIAL WORK PLACEMENT NOTE 05/03/2012  Patient:  [name], [name]  Account Number:  [id number] Admit date:  05/01/2012  Clinical Social Worker:  Johnsie Cancel  Date/time:  05/03/2012 04:20 PM  Clinical Social Work is seeking post-discharge placement for this patient at the following level of care:   SKILLED NURSING   (*CSW will update this form in Epic as items are completed)   05/03/2012  Patient/family provided with [facility] Department of Clinical Social Work's list of facilities offering this level of care within the geographic area requested by the patient (or if unable, by the patient's family).  05/03/2012  Patient/family informed of their freedom to choose among providers that offer the needed level of care, that participate in Medicare, Medicaid or managed care program needed by the patient, have an available bed and are willing to accept the patient.  05/03/2012  Patient/family informed of [facility]' ownership interest in [facility], as well as of the fact that they are under no obligation to receive care at this facility.  PASARR submitted to [employer] on 05/05/2012 PASARR number received from [employer] on 05/05/2012  FL2 transmitted to all facilities in geographic area requested by pt/family on  05/03/2012 FL2 transmitted to all facilities within larger geographic area on   Patient informed that his/her managed care company has contracts with or will negotiate with  certain facilities, including the following:     Patient/family informed of bed offers received:  05/04/2012 Patient chooses bed at  [facility] Physician recommends and patient chooses bed at  N/A  Patient to be transferred to [facility] on 05/05/2012   Patient to be transferred to facility by [facility]  The following physician request were entered in Epic:   Additional Comments:  Lia Foyer, LCSWA [facility] Clinical Social Worker Contact  #: [phone]

## 2012-05-03 NOTE — Clinical Social Work Note (Signed)
Clinical Social Work Department BRIEF PSYCHOSOCIAL ASSESSMENT 05/03/2012  Patient:  [name], [name]     Account Number:  [id number]     Admit date:  05/01/2012  Clinical Social Worker:  Johnsie Cancel  Date/Time:  05/03/2012 04:07 PM  Referred by:  Physician  Date Referred:  05/02/2012 Referred for  SNF Placement   Other Referral:   Interview type:  Patient Other interview type:    PSYCHOSOCIAL DATA Living Status:  ALONE  Primary support name:  Max (brother) Primary support relationship to patient:  SIBLING Degree of support available:   Unknown, not at patient's bedside.    CURRENT CONCERNS Current Concerns  Post-Acute Placement   Other Concerns:    SOCIAL WORK ASSESSMENT / PLAN CSW consulted by MD re: SNF placement. CSW completed chart review and spoke with patient. CSW introduced self, explained role as CSW, and provided support on current hospital admission. CSW explained the PT recommendations for SNF and patient was agreeable to d/c plan. CSW encouraged patient to show SNF list to family and friends for recommendations. CSW faxed patient out to [facility] and completed an FL2. CSW will continue to follow.   Assessment/plan status:  Information/Referral to [facility] Other assessment/ plan:   Information/referral to community resources:  SNF list  PATIENT'S/FAMILY'S RESPONSE TO PLAN OF CARE: Patient thanked CSW for providing support and assisting in d/c.    Lia Foyer, LCSWA [facility] Clinical Social Worker Contact #: [phone]

## 2012-05-03 NOTE — Progress Notes (Signed)
[facility] PROGRESS NOTE  [name] ZOX:[id number] DOB: Oct 05, 1922 DOA: 05/01/2012 PCP: Rene Paci, MD  Brief Narrative: [name] is a 79 y.o. female history of atrial fibrillation diastolic CHF chronic lower extremity edema had a fall at her house yesterday and hurt her left hip. Patient states that she does not know how she fell but did not lose consciousness or hit her head. Since her fall she has been experiencing hip pain in particular left side. In the ER x-rays did not reveal any fracture since the pain is persistent and severe CT of the hip was done which only showed some inflammation changes but no fracture. Since patient's pain is persistent and severe this time patient has been admitted for further observation and management. EKG shows atrial fibrillation with controlled rate and CT head chest x-rays are unremarkable with INR being mildly supratherapeutic. Patient otherwise denies any chest pain shortness of breath nausea vomiting abdominal pain diarrhea or any dizziness. Patient's daughter states that patient uses a walker and she has a poor gait and balance.  Assessment/Plan: 1. Left hip pain status post fall - CT scan did not show any evidence of acute fracture or hardware dislocation. MRI reassuring. PT OT saw patient and they recommended skilled nursing facility. 2. Left wrist pain - with swelling, plain x-ray negative for fracture. 3. Atrial fibrillation - continue home medications 4. Bilateral LE recurrent cellulitis - per patient an ongoing problem. I have started Keflex on 3/3, legs improved today 5. History of heart failure with preserved systolic function - continue diuretics  Code Status: Full Family Communication: Daughter at bedside  Disposition Plan: Skilled nursing facility.   Consultants:  Orthopedics  Procedures:  None  Antibiotics:  None  HPI/Subjective: - denies chest pain.  Objective: Filed Vitals:   05/02/12 2054  05/02/12 2300 05/03/12 0446 05/03/12 0500  BP: 110/49 135/51 140/53   Pulse: 67 71 65   Temp: 98 F (36.7 C)  97.8 F (36.6 C)   TempSrc: Oral     Resp: 18  18   Height:      Weight:    80.151 kg (176 lb 11.2 oz)  SpO2: 98%  100%     Intake/Output Summary (Last 24 hours) at 05/03/12 0815 Last data filed at 05/03/12 0447  Gross per 24 hour  Intake    960 ml  Output   1050 ml  Net    -90 ml   Filed Weights   05/02/12 0534 05/03/12 0500  Weight: 84.687 kg (186 lb 11.2 oz) 80.151 kg (176 lb 11.2 oz)    Exam:   General:  NAD  Cardiovascular: regular, without MRG  Respiratory: good air movement, clear to auscultation throughout, no wheezing, ronchi or rales  Abdomen: soft, not tender to palpation, positive bowel sounds  MSK: no peripheral edema. Tenderness in the left hip area. Therese appear swollen and mildly erythematous. Tender with movement.  Neuro: Neuro exam nonfocal.  Data Reviewed: Basic Metabolic Panel:  Recent Labs Lab 05/01/12 2312 05/03/12 0600  NA 140 139  K 4.3 4.1  CL 106 101  CO2 26 27  GLUCOSE 105* 82  BUN 26* 25*  CREATININE 0.84 0.93  CALCIUM 9.3 8.8   Liver Function Tests:  Recent Labs Lab 05/01/12 2312  AST 29  ALT 18  ALKPHOS 76  BILITOT 0.7  PROT 7.0  ALBUMIN 3.5   CBC:  Recent Labs Lab 05/01/12 2312 05/03/12 0600  WBC 8.2 6.8  NEUTROABS 5.6  --  HGB 13.6 13.3  HCT 41.2 38.2  MCV 92.8 89.9  PLT 201 143*   Studies: Dg Chest 1 View  05/02/2012  *RADIOLOGY REPORT*  Clinical Data: Status post fall; left hip pain.  CHEST - 1 VIEW  Comparison: Chest radiograph performed 09/08/2007  Findings: The lungs are well-aerated.  Vascular congestion is noted, with mildly increased interstitial markings, possibly reflecting transient interstitial edema.  There is no evidence of pleural effusion or pneumothorax.  The cardiomediastinal silhouette is mildly enlarged.  No acute osseous abnormalities are seen.  Clips are noted overlying  the right axilla.  Degenerative change is noted at both glenohumeral joints.  IMPRESSION: Vascular congestion and mild cardiomegaly, with mildly increased interstitial markings, possibly reflecting transient interstitial edema.  No displaced rib fractures seen.   Original Report Authenticated By: Tonia Ghent, M.D.    Dg Wrist Complete Left  05/02/2012  *RADIOLOGY REPORT*  Clinical Data: Fall, posterior wrist pain, swelling  LEFT WRIST - COMPLETE 3+ VIEW  Comparison: None.  Findings: Diffuse osteopenia noted.  Osteoarthritis of the left first [facility] joint with joint space loss, sclerosis and osteophytes. Normal alignment without acute displaced fracture.  Distal radius, ulna and carpal bones appear intact.  IMPRESSION: Osteopenia and osteoarthritis.  No acute osseous finding by plain radiography   Original Report Authenticated By: Judie Petit. Shick, M.D.    Dg Hip Complete Left  05/02/2012  *RADIOLOGY REPORT*  Clinical Data: Status post fall; left hip pain.  LEFT HIP - COMPLETE 2+ VIEW  Comparison: Left hip radiographs performed 10/20/2004  Findings: There is no evidence of fracture or dislocation. Bilateral femoral prostheses appear grossly intact, though the right-sided prosthesis is incompletely imaged on this study.  Both femoral prostheses are seated normally at their respective acetabula, without evidence of loosening.  The proximal left femur appears intact.  Mild degenerative change is noted at the lower lumbar spine.  The sacroiliac joints are unremarkable in appearance.  Apparent lucency on the pelvis radiograph overlying the femoral prostheses is not characterized on the left hip view, and is likely artifactual in nature.  The visualized bowel gas pattern is grossly unremarkable in appearance.  Scattered phleboliths are noted within the pelvis.  IMPRESSION: No evidence of fracture or dislocation.  Bilateral femoral prostheses appear intact, without evidence of loosening.   Original Report Authenticated By:  Tonia Ghent, M.D.    Ct Head Wo Contrast  05/02/2012  *RADIOLOGY REPORT*  Clinical Data: Fall  CT HEAD WITHOUT CONTRAST  Technique:  Contiguous axial images were obtained from the base of the skull through the vertex without contrast.  Comparison: None.  Findings: No intracranial hemorrhage.  No parenchymal contusion. No midline shift or mass effect.  Basilar cisterns are patent. No skull base fracture.  No fluid in the paranasal sinuses or mastoid air cells.  There is mild periventricular white matter hypodensities.  IMPRESSION: No intracranial trauma.  Atrophy and microvascular disease.   Original Report Authenticated By: Genevive Bi, M.D.    Ct Pelvis Wo Contrast  05/02/2012  *RADIOLOGY REPORT*  Clinical Data:  Status post fall; left hip and groin pain.  Unable to bear weight on left leg.  CT PELVIS AND LEFT LOWER EXTREMITY WITHOUT CONTRAST  Technique:  Multidetector CT imaging of the pelvis and left lower extremity was performed according to the standard protocol without intravenous contrast.  Multiplanar CT image reconstructions were also generated.  Comparison:  Left hip radiographs performed earlier today at 12:00 a.m.  CT PELVIS  Findings:  There is  no evidence of fracture or dislocation.  The sacrum appears grossly intact, though an occult fracture cannot be entirely excluded on CT.  The sacroiliac joints are grossly unremarkable in appearance.  The acetabula are difficult to fully characterize but appear grossly intact; evaluation is mildly suboptimal due to metal artifact.  The femoral prostheses are noted in expected alignment, and appear intact bilaterally, without evidence of loosening.  Mild degenerative change is noted at the lower lumbar spine.  There is slightly unusual low attenuation at the expected location of the uterus; this likely reflects the patient's baseline, given the patient's age.  There is ectasia of the right common iliac artery to 1.6 cm, with mild associated  calcification.  The visualized small and large bowel loops are grossly unremarkable in appearance.  The appendix is normal in caliber and contains minimal contrast.  Scattered vascular calcifications are seen.  No significant soft tissue injury is noted.  IMPRESSION:  1.  No evidence of fracture or dislocation. 2.  Bilateral femoral prostheses appear intact, without evidence of loosening. 3.  Ectasia of the right common iliac artery to 1.6 cm, with mild associated calcific atherosclerosis.  CT LEFT LOWER EXTREMITY  Findings: The proximal left femur appears intact.  There is no evidence of fracture or loosening.  The left femoral prosthesis is seated in expected position.  There is no definite evidence for particle disease.  There is mild osteopenia of visualized osseous structures.  The right femoral prosthesis also appears intact, without evidence of loosening.  There is mild soft tissue injury along the lateral aspect of the left mid thigh, with mild soft tissue inflammation.  The underlying musculature is unremarkable in appearance.  The vasculature is not well assessed without contrast.  Visualized inguinal nodes remain borderline normal in size.  IMPRESSION:  1.  No evidence of fracture or dislocation. 2.  Femoral prostheses appear intact, without evidence of loosening. 3.  Mild osteopenia of visualized osseous structures. 4.  Mild soft tissue injury along the lateral aspect of the left mid thigh, with mild soft tissue inflammation.  Underlying musculature is grossly unremarkable.   Original Report Authenticated By: Tonia Ghent, M.D.    Ct Hip Left Wo Contrast  05/02/2012  *RADIOLOGY REPORT*  Clinical Data:  Status post fall; left hip and groin pain.  Unable to bear weight on left leg.  CT PELVIS AND LEFT LOWER EXTREMITY WITHOUT CONTRAST  Technique:  Multidetector CT imaging of the pelvis and left lower extremity was performed according to the standard protocol without intravenous contrast.  Multiplanar CT  image reconstructions were also generated.  Comparison:  Left hip radiographs performed earlier today at 12:00 a.m.  CT PELVIS  Findings:  There is no evidence of fracture or dislocation.  The sacrum appears grossly intact, though an occult fracture cannot be entirely excluded on CT.  The sacroiliac joints are grossly unremarkable in appearance.  The acetabula are difficult to fully characterize but appear grossly intact; evaluation is mildly suboptimal due to metal artifact.  The femoral prostheses are noted in expected alignment, and appear intact bilaterally, without evidence of loosening.  Mild degenerative change is noted at the lower lumbar spine.  There is slightly unusual low attenuation at the expected location of the uterus; this likely reflects the patient's baseline, given the patient's age.  There is ectasia of the right common iliac artery to 1.6 cm, with mild associated calcification.  The visualized small and large bowel loops are grossly unremarkable in appearance.  The  appendix is normal in caliber and contains minimal contrast.  Scattered vascular calcifications are seen.  No significant soft tissue injury is noted.  IMPRESSION:  1.  No evidence of fracture or dislocation. 2.  Bilateral femoral prostheses appear intact, without evidence of loosening. 3.  Ectasia of the right common iliac artery to 1.6 cm, with mild associated calcific atherosclerosis.  CT LEFT LOWER EXTREMITY  Findings: The proximal left femur appears intact.  There is no evidence of fracture or loosening.  The left femoral prosthesis is seated in expected position.  There is no definite evidence for particle disease.  There is mild osteopenia of visualized osseous structures.  The right femoral prosthesis also appears intact, without evidence of loosening.  There is mild soft tissue injury along the lateral aspect of the left mid thigh, with mild soft tissue inflammation.  The underlying musculature is unremarkable in appearance.   The vasculature is not well assessed without contrast.  Visualized inguinal nodes remain borderline normal in size.  IMPRESSION:  1.  No evidence of fracture or dislocation. 2.  Femoral prostheses appear intact, without evidence of loosening. 3.  Mild osteopenia of visualized osseous structures. 4.  Mild soft tissue injury along the lateral aspect of the left mid thigh, with mild soft tissue inflammation.  Underlying musculature is grossly unremarkable.   Original Report Authenticated By: Tonia Ghent, M.D.    Mr Hip Right Wo Contrast  05/02/2012  *RADIOLOGY REPORT*  Clinical Data: Right hip pain.  MRI OF THE RIGHT HIP WITHOUT CONTRAST  Technique:  Multiplanar, multisequence MR imaging was performed. No intravenous contrast was administered.  Comparison: CT scan 05/02/2012.  Findings: The right total hip arthroplasty appears intact.  No obvious fracture.  No abnormal para-articular fluid collections are identified.  Moderate artifact is noted.  Moderate fatty atrophy of the gluteus muscles is noted bilaterally.  The bony pelvis appears intact.  No significant intrapelvic abnormalities.  A tiny right inguinal hernia is noted.  IMPRESSION:  1.  No complicating features associated with the right hip prosthesis. 2.  No pelvic fracture.   Original Report Authenticated By: Rudie Meyer, M.D.    Mr Hip Left Wo Contrast  05/02/2012  *RADIOLOGY REPORT*  Clinical Data: Left hip pain.  Total left hip arthroplasty.  MRI OF THE LEFT HIP WITHOUT CONTRAST  Technique:  Multiplanar, multisequence MR imaging was performed. No intravenous contrast was administered.  Comparison: CT scan 05/02/2012.  Findings: Significant artifact related to the bilateral hip prosthesis.  No obvious hip or pelvic fracture.  There is bilateral subcutaneous edema of uncertain significance.  There is a fluid collection deep to the fascia which could be trochanteric bursitis or a postoperative fluid collection.  A pseudotumor associated with the  prosthesis is also possible.  No inguinal mass are adenopathy.  IMPRESSION:  1.  5 x 8 2 x 3 cm fluid collection near the greater trochanter could reflect trochanteric bursitis or a pseudotumor associated with the prosthesis. 2.  No obvious pelvic or hip fracture.   Original Report Authenticated By: Rudie Meyer, M.D.    Dg Knee Complete 4 Views Left  05/02/2012  *RADIOLOGY REPORT*  Clinical Data: Fall, knee pain  LEFT KNEE - COMPLETE 4+ VIEW  Comparison: none  Findings: There is no evidence of acute fracture of the left knee. There is severe joint space narrowing of the lateral compartment. There is osteophytosis of the patellofemoral compartment.  No joint effusion.  IMPRESSION: 1.  No acute fracture of the left  knee. 2.  Severe osteoarthritis of the lateral compartment.   Original Report Authenticated By: Genevive Bi, M.D.     Scheduled Meds: . cephALEXin  500 mg Oral Q12H  . metoprolol succinate  75 mg Oral BID  . omega-3 acid ethyl esters  1 g Oral Daily  . potassium chloride SA  20 mEq Oral BID  . sodium chloride  3 mL Intravenous Q12H  . sodium chloride  3 mL Intravenous Q12H  . torsemide  20 mg Oral BID  . vitamin C  500 mg Oral Daily  . Warfarin - Pharmacist Dosing Inpatient   Does not apply q1800   Continuous Infusions:   Principal Problem:   Hip pain Active Problems:   HYPERTENSION   Atrial fibrillation   Shelly Bombard, MD [facility] Pager [phone]. If 7 PM - 7 AM, please contact night-coverage at [email or web address], password [facility] 05/03/2012, 8:15 AM  LOS: 2 days

## 2012-05-03 NOTE — Progress Notes (Signed)
Physical Therapy Treatment Patient Details Name: [name] MRN: [id number] DOB: 1922-11-20 Today's Date: 05/03/2012 Time: 4098-1191 PT Time Calculation (min): 27 min  PT Assessment / Plan / Recommendation Comments on Treatment Session       Follow Up Recommendations  SNF     Does the patient have the potential to tolerate intense rehabilitation     Barriers to Discharge        Equipment Recommendations  None recommended by PT    Recommendations for Other Services OT consult  Frequency Min 3X/week   Plan Discharge plan remains appropriate    Precautions / Restrictions Precautions Precautions: Fall Restrictions Weight Bearing Restrictions: No   Pertinent Vitals/Pain 7/10; premed, RN aware    Mobility  Bed Mobility Bed Mobility: Supine to Sit Supine to Sit: 2: Max assist;With rails Sitting - Scoot to Edge of Bed: 3: Mod assist Details for Bed Mobility Assistance: cues for sequencing, safe technique, encouragement.   Transfers Transfers: Sit to Stand;Stand to Sit Sit to Stand: 1: +2 Total assist;With upper extremity assist;From bed Sit to Stand: Patient Percentage: 50% Stand to Sit: 1: +2 Total assist;With upper extremity assist;To bed;To chair/3-in-1 Stand to Sit: Patient Percentage: 60% Details for Transfer Assistance: cues for use of UEs, upright posture, controlling descent to bed.   Ambulation/Gait Ambulation/Gait Assistance: 1: +2 Total assist Ambulation/Gait: Patient Percentage: 60% Ambulation Distance (Feet): 1 Feet Assistive device: Rolling walker Ambulation/Gait Assistance Details: pain ltd - pvted on R LE and chair brought behind Gait Pattern: Step-to pattern;Decreased step length - right;Decreased step length - left;Decreased stance time - left    Exercises     PT Diagnosis:    PT Problem List:   PT Treatment Interventions:     PT Goals Acute Rehab PT Goals PT Goal Formulation: With patient Time For Goal Achievement: 05/16/12 Potential to  Achieve Goals: Fair Pt will go Supine/Side to Sit: with supervision PT Goal: Supine/Side to Sit - Progress: Progressing toward goal Pt will go Sit to Supine/Side: with supervision PT Goal: Sit to Supine/Side - Progress: Progressing toward goal Pt will go Sit to Stand: with supervision PT Goal: Sit to Stand - Progress: Progressing toward goal Pt will go Stand to Sit: with supervision PT Goal: Stand to Sit - Progress: Progressing toward goal Pt will Ambulate: 51 - 150 feet;with min assist;with rolling walker PT Goal: Ambulate - Progress: Progressing toward goal Pt will Go Up / Down Stairs: 3-5 stairs;with min assist;with rolling walker  Visit Information  Last PT Received On: 05/03/12 Assistance Needed: +2    Subjective Data  Subjective: This shoulder and this hip just hurt a lot Patient Stated Goal: Move without pain.     Cognition  Cognition Overall Cognitive Status: Appears within functional limits for tasks assessed/performed Arousal/Alertness: Awake/alert Orientation Level: Appears intact for tasks assessed Behavior During Session: [facility] for tasks performed    Balance  Static Standing Balance Static Standing - Balance Support: Bilateral upper extremity supported;During functional activity Static Standing - Level of Assistance: 3: Mod assist;4: Min assist Static Standing - Comment/# of Minutes: 5  End of Session PT - End of Session Equipment Utilized During Treatment: Gait belt;Oxygen Activity Tolerance: Patient limited by pain Patient left: in chair;with call bell/phone within reach;with nursing in room Nurse Communication: Mobility status   GP     Davit Vassar 05/03/2012, 12:46 PM

## 2012-05-04 ENCOUNTER — Observation Stay (HOSPITAL_COMMUNITY): Payer: Medicare PPO

## 2012-05-04 DIAGNOSIS — I10 Essential (primary) hypertension: Secondary | ICD-10-CM

## 2012-05-04 LAB — PROTIME-INR
INR: 2.18 — ABNORMAL HIGH (ref 0.00–1.49)
Prothrombin Time: 23.3 seconds — ABNORMAL HIGH (ref 11.6–15.2)

## 2012-05-04 MED ORDER — ONDANSETRON HCL 4 MG PO TABS: 4.0000 mg | ORAL_TABLET | Freq: Four times a day (QID) | ORAL | Status: DC | PRN

## 2012-05-04 MED ORDER — ACETAMINOPHEN 325 MG PO TABS: 650.0000 mg | ORAL_TABLET | Freq: Four times a day (QID) | ORAL | Status: DC | PRN

## 2012-05-04 MED ORDER — BISACODYL 10 MG RE SUPP
10.0000 mg | Freq: Every day | RECTAL | Status: DC | PRN
  Administered 2012-05-04: 10 mg via RECTAL
  Filled 2012-05-04: qty 1

## 2012-05-04 MED ORDER — HYDROCODONE-ACETAMINOPHEN 5-325 MG PO TABS: 1.0000 | ORAL_TABLET | Freq: Four times a day (QID) | ORAL | Status: DC | PRN

## 2012-05-04 MED ORDER — SENNOSIDES-DOCUSATE SODIUM 8.6-50 MG PO TABS: 1.0000 | ORAL_TABLET | Freq: Two times a day (BID) | ORAL | Status: DC

## 2012-05-04 MED ORDER — HYDROCODONE-ACETAMINOPHEN 5-325 MG PO TABS
1.0000 | ORAL_TABLET | Freq: Four times a day (QID) | ORAL | Status: DC | PRN
  Administered 2012-05-04 – 2012-05-05 (×3): 1 via ORAL
  Filled 2012-05-04 (×3): qty 1

## 2012-05-04 MED ORDER — SENNOSIDES-DOCUSATE SODIUM 8.6-50 MG PO TABS
1.0000 | ORAL_TABLET | Freq: Two times a day (BID) | ORAL | Status: DC
  Administered 2012-05-04 – 2012-05-05 (×2): 1 via ORAL
  Filled 2012-05-04 (×2): qty 1

## 2012-05-04 MED ORDER — POLYETHYLENE GLYCOL 3350 17 G PO PACK: 17.0000 g | PACK | Freq: Every day | ORAL | Status: DC

## 2012-05-04 MED ORDER — POLYETHYLENE GLYCOL 3350 17 G PO PACK
17.0000 g | PACK | Freq: Every day | ORAL | Status: DC
  Administered 2012-05-04 – 2012-05-05 (×2): 17 g via ORAL
  Filled 2012-05-04 (×2): qty 1

## 2012-05-04 MED ORDER — CEPHALEXIN 500 MG PO CAPS: 500.0000 mg | ORAL_CAPSULE | Freq: Two times a day (BID) | ORAL | Status: AC

## 2012-05-04 NOTE — Progress Notes (Signed)
ANTICOAGULATION CONSULT NOTE - Follow Up Consult  Pharmacy Consult for Coumadin Indication: atrial fibrillation  Allergies  Allergen Reactions  . Meperidine Hcl Nausea Only  . Oxycodone-Acetaminophen Nausea Only    Patient Measurements: Height: 5\' 6"  (167.6 cm) Weight: 176 lb 11.2 oz (80.151 kg) IBW/kg (Calculated) : 59.3  Vital Signs: Temp: 97.9 F (36.6 C) (03/05 0549) BP: 107/42 mmHg (03/05 1000) Pulse Rate: 60 (03/05 1000)  Labs:  Recent Labs  05/01/12 2312 05/02/12 1157 05/03/12 0600 05/04/12 0645  HGB 13.6  --  13.3  --   HCT 41.2  --  38.2  --   PLT 201  --  143*  --   APTT 43*  --   --   --   LABPROT 30.7*  --  32.7* 23.3*  INR 3.16*  --  3.44* 2.18*  CREATININE 0.84  --  0.93  --   TROPONINI  --  <0.30  --   --     Estimated Creatinine Clearance: 45.5 ml/min (by C-G formula based on Cr of 0.93).   Medications:  Scheduled:  . cephALEXin  500 mg Oral Q12H  . metoprolol succinate  75 mg Oral BID  . omega-3 acid ethyl esters  1 g Oral Daily  . polyethylene glycol  17 g Oral Daily  . potassium chloride SA  20 mEq Oral BID  . senna-docusate  1 tablet Oral BID  . sodium chloride  3 mL Intravenous Q12H  . sodium chloride  3 mL Intravenous Q12H  . torsemide  20 mg Oral BID  . vitamin C  500 mg Oral Daily  . Warfarin - Pharmacist Dosing Inpatient   Does not apply q1800    Assessment: 79 yo female with fall and trouble ambulating. Will continue Coumadin for atrial fibrillation.  Home dose of Coumadin is 9mg  MWF and 6mg  all other days.   INR today is therapeutic is 2.18. SCr 0.93 and CrCl 45.39ml/min. No evidence of bleeding at this time. Recommend resuming home dose of Coumadin.   Goal of Therapy:  INR 2-3 Monitor platelets by anticoagulation protocol: Yes   Plan:  Resume home dose of Coumadin (6mg  every day, except 9mg  on MWF) on discharge.  Daily PT/INR  Micheline Chapman PharmD Candidate 05/04/2012,11:19 AM

## 2012-05-04 NOTE — Progress Notes (Signed)
[facility] PROGRESS NOTE  [name] YNW:[id number] DOB: 02-Oct-1922 DOA: 05/01/2012 PCP: Rene Paci, MD  Brief Narrative: [name] is a 79 y.o. female history of atrial fibrillation diastolic CHF chronic lower extremity edema had a fall at her house yesterday and hurt her left hip. Patient states that she does not know how she fell but did not lose consciousness or hit her head. Since her fall she has been experiencing hip pain in particular left side. In the ER x-rays did not reveal any fracture since the pain is persistent and severe CT of the hip was done which only showed some inflammation changes but no fracture. Since patient's pain is persistent and severe this time patient has been admitted for further observation and management. EKG shows atrial fibrillation with controlled rate and CT head chest x-rays are unremarkable with INR being mildly supratherapeutic. Patient otherwise denies any chest pain shortness of breath nausea vomiting abdominal pain diarrhea or any dizziness. Patient's daughter states that patient uses a walker and she has a poor gait and balance.  Assessment/Plan: Left hip pain status post fall CT scan did not show any evidence of acute fracture or hardware dislocation. MRI reassuring. PT/OT saw patient and they recommended skilled nursing facility. Pain controlled with pain medications.  Left sided rib/chest pain post fall Likely due musculoskeletal from the fall, tender to palpation. Chest x-ray negative for rib fracture, will get dedicated rib x-ray to rule out fractures.  Left wrist pain - with swelling Plain x-ray negative for fracture.  Atrial fibrillation Rate controlled.  Anticoagulated on coumadin, therapeutic INR.  Bilateral LE recurrent cellulitis/Chronic venous stasis changes Per patient an ongoing problem. Started on Keflex on 05/02/2012. To me they appear to be due to chronic venous stasis changes. Define a 10 day course of  antibiotics.  History of heart failure with preserved systolic function, chronic diastolic heart failure Currently compensated, continue diuretics.  Code Status: Full Family Communication: No family present. Disposition Plan: Skilled nursing facility.   Consultants:  Orthopedics  Procedures:  None  Antibiotics:  Cephalexin 05/02/2012 >>  HPI/Subjective: Complaining of left sided rib pain that is tender to palpation.  Objective: Filed Vitals:   05/03/12 0800 05/03/12 1300 05/03/12 2121 05/04/12 0549  BP: 114/97 110/58 147/78 159/77  Pulse: 78 76 79 72  Temp: 98.2 F (36.8 C) 98.3 F (36.8 C) 98.2 F (36.8 C) 97.9 F (36.6 C)  TempSrc: Oral     Resp: 16 16 18 20   Height:      Weight:      SpO2: 98%  100% 100%    Intake/Output Summary (Last 24 hours) at 05/04/12 0751 Last data filed at 05/03/12 2200  Gross per 24 hour  Intake   1200 ml  Output      0 ml  Net   1200 ml   Filed Weights   05/02/12 0534 05/03/12 0500  Weight: 84.687 kg (186 lb 11.2 oz) 80.151 kg (176 lb 11.2 oz)    Exam:   General:  NAD  Cardiovascular: regular, without MRG  Respiratory: good air movement, clear to auscultation throughout, no wheezing, ronchi or rales  Abdomen: soft, not tender to palpation, positive bowel sounds  MSK: no peripheral edema. Tenderness in the left hip area. Therese appear swollen and mildly erythematous. Tender with movement.  Neuro: Neuro exam nonfocal.  Chest: Tender to palpation over the left bottom ribs.  Data Reviewed: Basic Metabolic Panel:  Recent Labs Lab 05/01/12 2312 05/03/12 0600  NA 140 139  K 4.3 4.1  CL 106 101  CO2 26 27  GLUCOSE 105* 82  BUN 26* 25*  CREATININE 0.84 0.93  CALCIUM 9.3 8.8   Liver Function Tests:  Recent Labs Lab 05/01/12 2312  AST 29  ALT 18  ALKPHOS 76  BILITOT 0.7  PROT 7.0  ALBUMIN 3.5   CBC:  Recent Labs Lab 05/01/12 2312 05/03/12 0600  WBC 8.2 6.8  NEUTROABS 5.6  --   HGB 13.6 13.3   HCT 41.2 38.2  MCV 92.8 89.9  PLT 201 143*   Studies: Dg Wrist Complete Left  2012-06-01  *RADIOLOGY REPORT*  Clinical Data: Fall, posterior wrist pain, swelling  LEFT WRIST - COMPLETE 3+ VIEW  Comparison: None.  Findings: Diffuse osteopenia noted.  Osteoarthritis of the left first [facility] joint with joint space loss, sclerosis and osteophytes. Normal alignment without acute displaced fracture.  Distal radius, ulna and carpal bones appear intact.  IMPRESSION: Osteopenia and osteoarthritis.  No acute osseous finding by plain radiography   Original Report Authenticated By: Judie Petit. Miles Costain, M.D.    Mr Hip Right Wo Contrast  06/01/2012  *RADIOLOGY REPORT*  Clinical Data: Right hip pain.  MRI OF THE RIGHT HIP WITHOUT CONTRAST  Technique:  Multiplanar, multisequence MR imaging was performed. No intravenous contrast was administered.  Comparison: CT scan 06/01/2012.  Findings: The right total hip arthroplasty appears intact.  No obvious fracture.  No abnormal para-articular fluid collections are identified.  Moderate artifact is noted.  Moderate fatty atrophy of the gluteus muscles is noted bilaterally.  The bony pelvis appears intact.  No significant intrapelvic abnormalities.  A tiny right inguinal hernia is noted.  IMPRESSION:  1.  No complicating features associated with the right hip prosthesis. 2.  No pelvic fracture.   Original Report Authenticated By: Rudie Meyer, M.D.    Mr Hip Left Wo Contrast  June 01, 2012  *RADIOLOGY REPORT*  Clinical Data: Left hip pain.  Total left hip arthroplasty.  MRI OF THE LEFT HIP WITHOUT CONTRAST  Technique:  Multiplanar, multisequence MR imaging was performed. No intravenous contrast was administered.  Comparison: CT scan 06/01/2012.  Findings: Significant artifact related to the bilateral hip prosthesis.  No obvious hip or pelvic fracture.  There is bilateral subcutaneous edema of uncertain significance.  There is a fluid collection deep to the fascia which could be trochanteric  bursitis or a postoperative fluid collection.  A pseudotumor associated with the prosthesis is also possible.  No inguinal mass are adenopathy.  IMPRESSION:  1.  5 x 8 2 x 3 cm fluid collection near the greater trochanter could reflect trochanteric bursitis or a pseudotumor associated with the prosthesis. 2.  No obvious pelvic or hip fracture.   Original Report Authenticated By: Rudie Meyer, M.D.     Scheduled Meds: . cephALEXin  500 mg Oral Q12H  . metoprolol succinate  75 mg Oral BID  . omega-3 acid ethyl esters  1 g Oral Daily  . potassium chloride SA  20 mEq Oral BID  . senna-docusate  1 tablet Oral BID  . sodium chloride  3 mL Intravenous Q12H  . sodium chloride  3 mL Intravenous Q12H  . torsemide  20 mg Oral BID  . vitamin C  500 mg Oral Daily  . Warfarin - Pharmacist Dosing Inpatient   Does not apply q1800   Continuous Infusions:   Principal Problem:   Hip pain Active Problems:   HYPERTENSION   Atrial fibrillation   Fall  Javonna Balli  Betti Cruz, MD [facility] Pager [phone]. If 7 PM - 7 AM, please contact night-coverage at [email or web address], password [facility] 05/04/2012, 7:51 AM  LOS: 3 days

## 2012-05-04 NOTE — Discharge Summary (Addendum)
Physician Discharge Summary  [name]:[id number] DOB: [birth year] DOA: 05/01/2012  PCP: [name] Paci, MD  Admit date: 05/01/2012 Discharge date: 05/05/2012  Time spent: 35 minutes  Recommendations for Outpatient Follow-up:  Please followup with [name] Paci, MD (PCP) in 1 week.  Please have PT/INR checked on 05/06/2012, pharmacy to adjust coumadin dosing if deemed appropriate.  Discharge Diagnoses:  Principal Problem:   Hip pain Active Problems:   HYPERTENSION   Atrial fibrillation   Fall   Discharge Condition: Stable  Diet recommendation: Heart healthy diet  Filed Weights   05/02/12 0534 05/03/12 0500  Weight: 84.687 kg (186 lb 11.2 oz) 80.151 kg (176 lb 11.2 oz)    History of present illness:  [name] is a 79 y.o. female history of atrial fibrillation diastolic CHF chronic lower extremity edema had a fall at her house yesterday and hurt her left hip. Patient states that she does not know how she fell but did not lose consciousness or hit her head. Since her fall she has been experiencing hip pain in particular left side. In the ER x-rays did not reveal any fracture since the pain is persistent and severe CT of the hip was done which only showed some inflammation changes but no fracture. Since patient's pain is persistent and severe this time patient has been admitted for further observation and management. EKG shows atrial fibrillation with controlled rate and CT head chest x-rays are unremarkable with INR being mildly supratherapeutic. Patient otherwise denies any chest pain shortness of breath nausea vomiting abdominal pain diarrhea or any dizziness. Patient's daughter states that patient uses a walker and she has a poor gait and balance.  Hospital Course:  Left hip pain status post fall CT scan did not show any evidence of acute fracture or hardware dislocation. MRI reassuring. PT/OT saw patient and they recommended skilled nursing facility. Pain  controlled with pain medications, further titration of pain medications to be done at [facility].  Left sided rib/chest pain post fall Likely due musculoskeletal from the fall. Rib x-ray negative for fractures.  Left wrist pain - with swelling Plain x-ray negative for fracture.  Atrial fibrillation Rate controlled.  Anticoagulated on coumadin, therapeutic INR.  Bilateral LE recurrent cellulitis/Chronic venous stasis changes Per patient an ongoing problem. Started on Keflex on 05/02/2012. To me they appear to be due to chronic venous stasis changes. Define a 10 day course of antibiotics till 05/11/2012.  History of heart failure with preserved systolic function, chronic diastolic heart failure Currently compensated, continue diuretics.  Constipation Stool softeners and suppositories as needed.  Consultants:  Orthopedics  Procedures:  None  Antibiotics:  Cephalexin 05/02/2012 >> (Till 05/11/2012)  Discharge Exam: Filed Vitals:   05/04/12 1459 05/04/12 2113 05/05/12 0605 05/05/12 1136  BP: 105/57 125/69 135/63 102/62  Pulse: 68 69 71 98  Temp: 97.4 F (36.3 C) 98.6 F (37 C) 98.8 F (37.1 C)   TempSrc:      Resp: 18 18 20    Height:      Weight:      SpO2: 100% 96% 98%    Discharge Instructions      Discharge Orders   Future Appointments Provider Department Dept Phone   06/02/2012 2:00 PM [facility] [phone]   Future Orders Complete By Expires     Diet - low sodium heart healthy  As directed     Discharge instructions  As directed     Comments:  Please followup with [name] Paci, MD (PCP) in 1 week.  Please have PT/INR checked on 05/06/2012, pharmacy to adjust coumadin dosing if deemed appropriate.    Increase activity slowly  As directed         Medication List    STOP taking these medications       HYDROcodone-acetaminophen 5-325 MG per tablet  Commonly known as:  NORCO/VICODIN      TAKE these  medications       acetaminophen 325 MG tablet  Commonly known as:  TYLENOL  Take 2 tablets (650 mg total) by mouth every 6 (six) hours as needed.     bisacodyl 10 MG suppository  Commonly known as:  DULCOLAX  Place 1 suppository (10 mg total) rectally daily as needed.     calcium-vitamin D 500-200 MG-UNIT per tablet  Commonly known as:  OSCAL WITH D  Take 1 tablet by mouth daily.     CENTRUM PO  Take 1 tablet by mouth daily.     cephALEXin 500 MG capsule  Commonly known as:  KEFLEX  Take 1 capsule (500 mg total) by mouth every 12 (twelve) hours. Till 05/11/2012.     cholecalciferol 1000 UNITS tablet  Commonly known as:  VITAMIN D  Take 1,000 Units by mouth daily.     FISH OIL PO  Take 1 tablet by mouth daily.     metoprolol succinate 50 MG 24 hr tablet  Commonly known as:  TOPROL-XL  Take 75 mg by mouth 2 (two) times daily. Take with or immediately following a meal.     ondansetron 4 MG tablet  Commonly known as:  ZOFRAN  Take 1 tablet (4 mg total) by mouth every 6 (six) hours as needed for nausea.     Oxycodone HCl 10 MG Tabs  Take 1 tablet (10 mg total) by mouth 2 (two) times daily as needed (Prior to working with physical therapy.).     oxyCODONE 5 MG immediate release tablet  Commonly known as:  Oxy IR/ROXICODONE  Take 1-2 tablets (5-10 mg total) by mouth every 4 (four) hours as needed.     polyethylene glycol packet  Commonly known as:  MIRALAX / GLYCOLAX  Take 17 g by mouth daily.     potassium chloride SA 20 MEQ tablet  Commonly known as:  K-DUR,KLOR-CON  Take 1 tablet (20 mEq total) by mouth 2 (two) times daily.     senna-docusate 8.6-50 MG per tablet  Commonly known as:  Senokot-S  Take 1 tablet by mouth 2 (two) times daily.     torsemide 20 MG tablet  Commonly known as:  DEMADEX  Take 20 mg by mouth 2 (two) times daily.     vitamin C 500 MG tablet  Commonly known as:  ASCORBIC ACID  Take 500 mg by mouth daily.     warfarin 6 MG tablet  Commonly  known as:  COUMADIN  Take 6-9 mg by mouth See admin instructions. Takes 1.5 tablets Mon,Wed and Fri.   Takes 1 tablet Sun,Tues,Thurs and sat       Follow-up Information   Follow up with [name] Paci, MD. Schedule an appointment as soon as possible for a visit in 1 week.   Contact information:   [address] [place] [address] [phone]        The results of significant diagnostics from this hospitalization (including imaging, microbiology, ancillary and laboratory) are listed below for reference.  Significant Diagnostic Studies: Dg Chest 1 View  05/02/2012  *RADIOLOGY REPORT*  Clinical Data: Status post fall; left hip pain.  CHEST - 1 VIEW  Comparison: Chest radiograph performed 09/08/2007  Findings: The lungs are well-aerated.  Vascular congestion is noted, with mildly increased interstitial markings, possibly reflecting transient interstitial edema.  There is no evidence of pleural effusion or pneumothorax.  The cardiomediastinal silhouette is mildly enlarged.  No acute osseous abnormalities are seen.  Clips are noted overlying the right axilla.  Degenerative change is noted at both glenohumeral joints.  IMPRESSION: Vascular congestion and mild cardiomegaly, with mildly increased interstitial markings, possibly reflecting transient interstitial edema.  No displaced rib fractures seen.   Original Report Authenticated By: Tonia Ghent, M.D.    Dg Wrist Complete Left  05/02/2012  *RADIOLOGY REPORT*  Clinical Data: Fall, posterior wrist pain, swelling  LEFT WRIST - COMPLETE 3+ VIEW  Comparison: None.  Findings: Diffuse osteopenia noted.  Osteoarthritis of the left first [facility] joint with joint space loss, sclerosis and osteophytes. Normal alignment without acute displaced fracture.  Distal radius, ulna and carpal bones appear intact.  IMPRESSION: Osteopenia and osteoarthritis.  No acute osseous finding by plain radiography   Original Report Authenticated  By: Judie Petit. Shick, M.D.    Dg Hip Complete Left  05/02/2012  *RADIOLOGY REPORT*  Clinical Data: Status post fall; left hip pain.  LEFT HIP - COMPLETE 2+ VIEW  Comparison: Left hip radiographs performed 10/20/2004  Findings: There is no evidence of fracture or dislocation. Bilateral femoral prostheses appear grossly intact, though the right-sided prosthesis is incompletely imaged on this study.  Both femoral prostheses are seated normally at their respective acetabula, without evidence of loosening.  The proximal left femur appears intact.  Mild degenerative change is noted at the lower lumbar spine.  The sacroiliac joints are unremarkable in appearance.  Apparent lucency on the pelvis radiograph overlying the femoral prostheses is not characterized on the left hip view, and is likely artifactual in nature.  The visualized bowel gas pattern is grossly unremarkable in appearance.  Scattered phleboliths are noted within the pelvis.  IMPRESSION: No evidence of fracture or dislocation.  Bilateral femoral prostheses appear intact, without evidence of loosening.   Original Report Authenticated By: Tonia Ghent, M.D.    Ct Head Wo Contrast  05/02/2012  *RADIOLOGY REPORT*  Clinical Data: Fall  CT HEAD WITHOUT CONTRAST  Technique:  Contiguous axial images were obtained from the base of the skull through the vertex without contrast.  Comparison: None.  Findings: No intracranial hemorrhage.  No parenchymal contusion. No midline shift or mass effect.  Basilar cisterns are patent. No skull base fracture.  No fluid in the paranasal sinuses or mastoid air cells.  There is mild periventricular white matter hypodensities.  IMPRESSION: No intracranial trauma.  Atrophy and microvascular disease.   Original Report Authenticated By: Genevive Bi, M.D.    Ct Pelvis Wo Contrast  05/02/2012  *RADIOLOGY REPORT*  Clinical Data:  Status post fall; left hip and groin pain.  Unable to bear weight on left leg.  CT PELVIS AND LEFT LOWER  EXTREMITY WITHOUT CONTRAST  Technique:  Multidetector CT imaging of the pelvis and left lower extremity was performed according to the standard protocol without intravenous contrast.  Multiplanar CT image reconstructions were also generated.  Comparison:  Left hip radiographs performed earlier today at 12:00 a.m.  CT PELVIS  Findings:  There is no evidence of fracture or dislocation.  The sacrum appears grossly intact, though an occult  fracture cannot be entirely excluded on CT.  The sacroiliac joints are grossly unremarkable in appearance.  The acetabula are difficult to fully characterize but appear grossly intact; evaluation is mildly suboptimal due to metal artifact.  The femoral prostheses are noted in expected alignment, and appear intact bilaterally, without evidence of loosening.  Mild degenerative change is noted at the lower lumbar spine.  There is slightly unusual low attenuation at the expected location of the uterus; this likely reflects the patient's baseline, given the patient's age.  There is ectasia of the right common iliac artery to 1.6 cm, with mild associated calcification.  The visualized small and large bowel loops are grossly unremarkable in appearance.  The appendix is normal in caliber and contains minimal contrast.  Scattered vascular calcifications are seen.  No significant soft tissue injury is noted.  IMPRESSION:  1.  No evidence of fracture or dislocation. 2.  Bilateral femoral prostheses appear intact, without evidence of loosening. 3.  Ectasia of the right common iliac artery to 1.6 cm, with mild associated calcific atherosclerosis.  CT LEFT LOWER EXTREMITY  Findings: The proximal left femur appears intact.  There is no evidence of fracture or loosening.  The left femoral prosthesis is seated in expected position.  There is no definite evidence for particle disease.  There is mild osteopenia of visualized osseous structures.  The right femoral prosthesis also appears intact, without  evidence of loosening.  There is mild soft tissue injury along the lateral aspect of the left mid thigh, with mild soft tissue inflammation.  The underlying musculature is unremarkable in appearance.  The vasculature is not well assessed without contrast.  Visualized inguinal nodes remain borderline normal in size.  IMPRESSION:  1.  No evidence of fracture or dislocation. 2.  Femoral prostheses appear intact, without evidence of loosening. 3.  Mild osteopenia of visualized osseous structures. 4.  Mild soft tissue injury along the lateral aspect of the left mid thigh, with mild soft tissue inflammation.  Underlying musculature is grossly unremarkable.   Original Report Authenticated By: Tonia Ghent, M.D.    Ct Hip Left Wo Contrast  05/02/2012  *RADIOLOGY REPORT*  Clinical Data:  Status post fall; left hip and groin pain.  Unable to bear weight on left leg.  CT PELVIS AND LEFT LOWER EXTREMITY WITHOUT CONTRAST  Technique:  Multidetector CT imaging of the pelvis and left lower extremity was performed according to the standard protocol without intravenous contrast.  Multiplanar CT image reconstructions were also generated.  Comparison:  Left hip radiographs performed earlier today at 12:00 a.m.  CT PELVIS  Findings:  There is no evidence of fracture or dislocation.  The sacrum appears grossly intact, though an occult fracture cannot be entirely excluded on CT.  The sacroiliac joints are grossly unremarkable in appearance.  The acetabula are difficult to fully characterize but appear grossly intact; evaluation is mildly suboptimal due to metal artifact.  The femoral prostheses are noted in expected alignment, and appear intact bilaterally, without evidence of loosening.  Mild degenerative change is noted at the lower lumbar spine.  There is slightly unusual low attenuation at the expected location of the uterus; this likely reflects the patient's baseline, given the patient's age.  There is ectasia of the right common  iliac artery to 1.6 cm, with mild associated calcification.  The visualized small and large bowel loops are grossly unremarkable in appearance.  The appendix is normal in caliber and contains minimal contrast.  Scattered vascular calcifications are seen.  No significant soft tissue injury is noted.  IMPRESSION:  1.  No evidence of fracture or dislocation. 2.  Bilateral femoral prostheses appear intact, without evidence of loosening. 3.  Ectasia of the right common iliac artery to 1.6 cm, with mild associated calcific atherosclerosis.  CT LEFT LOWER EXTREMITY  Findings: The proximal left femur appears intact.  There is no evidence of fracture or loosening.  The left femoral prosthesis is seated in expected position.  There is no definite evidence for particle disease.  There is mild osteopenia of visualized osseous structures.  The right femoral prosthesis also appears intact, without evidence of loosening.  There is mild soft tissue injury along the lateral aspect of the left mid thigh, with mild soft tissue inflammation.  The underlying musculature is unremarkable in appearance.  The vasculature is not well assessed without contrast.  Visualized inguinal nodes remain borderline normal in size.  IMPRESSION:  1.  No evidence of fracture or dislocation. 2.  Femoral prostheses appear intact, without evidence of loosening. 3.  Mild osteopenia of visualized osseous structures. 4.  Mild soft tissue injury along the lateral aspect of the left mid thigh, with mild soft tissue inflammation.  Underlying musculature is grossly unremarkable.   Original Report Authenticated By: Tonia Ghent, M.D.    Mr Hip Right Wo Contrast  05/02/2012  *RADIOLOGY REPORT*  Clinical Data: Right hip pain.  MRI OF THE RIGHT HIP WITHOUT CONTRAST  Technique:  Multiplanar, multisequence MR imaging was performed. No intravenous contrast was administered.  Comparison: CT scan 05/02/2012.  Findings: The right total hip arthroplasty appears intact.  No  obvious fracture.  No abnormal para-articular fluid collections are identified.  Moderate artifact is noted.  Moderate fatty atrophy of the gluteus muscles is noted bilaterally.  The bony pelvis appears intact.  No significant intrapelvic abnormalities.  A tiny right inguinal hernia is noted.  IMPRESSION:  1.  No complicating features associated with the right hip prosthesis. 2.  No pelvic fracture.   Original Report Authenticated By: Rudie Meyer, M.D.    Mr Hip Left Wo Contrast  05/02/2012  *RADIOLOGY REPORT*  Clinical Data: Left hip pain.  Total left hip arthroplasty.  MRI OF THE LEFT HIP WITHOUT CONTRAST  Technique:  Multiplanar, multisequence MR imaging was performed. No intravenous contrast was administered.  Comparison: CT scan 05/02/2012.  Findings: Significant artifact related to the bilateral hip prosthesis.  No obvious hip or pelvic fracture.  There is bilateral subcutaneous edema of uncertain significance.  There is a fluid collection deep to the fascia which could be trochanteric bursitis or a postoperative fluid collection.  A pseudotumor associated with the prosthesis is also possible.  No inguinal mass are adenopathy.  IMPRESSION:  1.  5 x 8 2 x 3 cm fluid collection near the greater trochanter could reflect trochanteric bursitis or a pseudotumor associated with the prosthesis. 2.  No obvious pelvic or hip fracture.   Original Report Authenticated By: Rudie Meyer, M.D.    Dg Knee Complete 4 Views Left  05/02/2012  *RADIOLOGY REPORT*  Clinical Data: Fall, knee pain  LEFT KNEE - COMPLETE 4+ VIEW  Comparison: none  Findings: There is no evidence of acute fracture of the left knee. There is severe joint space narrowing of the lateral compartment. There is osteophytosis of the patellofemoral compartment.  No joint effusion.  IMPRESSION: 1.  No acute fracture of the left knee. 2.  Severe osteoarthritis of the lateral compartment.   Original Report Authenticated By: Genevive Bi,  M.D.      Microbiology: No results found for this or any previous visit (from the past 240 hour(s)).   Labs: Basic Metabolic Panel:  Recent Labs Lab 05/01/12 2312 05/03/12 0600  NA 140 139  K 4.3 4.1  CL 106 101  CO2 26 27  GLUCOSE 105* 82  BUN 26* 25*  CREATININE 0.84 0.93  CALCIUM 9.3 8.8   Liver Function Tests:  Recent Labs Lab 05/01/12 2312  AST 29  ALT 18  ALKPHOS 76  BILITOT 0.7  PROT 7.0  ALBUMIN 3.5   No results found for this basename: LIPASE, AMYLASE,  in the last 168 hours No results found for this basename: AMMONIA,  in the last 168 hours CBC:  Recent Labs Lab 05/01/12 2312 05/03/12 0600  WBC 8.2 6.8  NEUTROABS 5.6  --   HGB 13.6 13.3  HCT 41.2 38.2  MCV 92.8 89.9  PLT 201 143*   Cardiac Enzymes:  Recent Labs Lab 05/02/12 1157  TROPONINI <0.30   BNP: BNP (last 3 results) No results found for this basename: PROBNP,  in the last 8760 hours CBG:  Recent Labs Lab 05/03/12 0449  GLUCAP 85       Signed:  Hiral Lukasiewicz A  [facility] 05/05/2012, 1:09 PM

## 2012-05-05 DIAGNOSIS — I5030 Unspecified diastolic (congestive) heart failure: Secondary | ICD-10-CM

## 2012-05-05 DIAGNOSIS — M25559 Pain in unspecified hip: Principal | ICD-10-CM

## 2012-05-05 LAB — PROTIME-INR
INR: 1.61 — ABNORMAL HIGH (ref 0.00–1.49)
Prothrombin Time: 18.6 seconds — ABNORMAL HIGH (ref 11.6–15.2)

## 2012-05-05 MED ORDER — BISACODYL 10 MG RE SUPP: 10.0000 mg | Freq: Every day | RECTAL | Status: DC | PRN

## 2012-05-05 MED ORDER — OXYCODONE HCL 10 MG PO TABS: 10.0000 mg | ORAL_TABLET | Freq: Two times a day (BID) | ORAL | Status: DC | PRN

## 2012-05-05 MED ORDER — OXYCODONE HCL 5 MG PO TABS: 10.0000 mg | ORAL_TABLET | Freq: Every day | ORAL | Status: DC | PRN

## 2012-05-05 MED ORDER — OXYCODONE HCL 5 MG PO TABS: 5.0000 mg | ORAL_TABLET | ORAL | Status: DC | PRN

## 2012-05-05 MED ORDER — WARFARIN SODIUM 6 MG PO TABS
9.0000 mg | ORAL_TABLET | Freq: Once | ORAL | Status: AC
  Administered 2012-05-05: 9 mg via ORAL
  Filled 2012-05-05: qty 1

## 2012-05-05 NOTE — Progress Notes (Signed)
[facility] PROGRESS NOTE  [name] XLK:[id number] DOB: [birth year] DOA: 05/01/2012 PCP: Rene Paci, MD  Brief Narrative: [name] is a 79 y.o. female history of atrial fibrillation diastolic CHF chronic lower extremity edema had a fall at her house yesterday and hurt her left hip. Patient states that she does not know how she fell but did not lose consciousness or hit her head. Since her fall she has been experiencing hip pain in particular left side. In the ER x-rays did not reveal any fracture since the pain is persistent and severe CT of the hip was done which only showed some inflammation changes but no fracture. Since patient's pain is persistent and severe this time patient has been admitted for further observation and management. EKG shows atrial fibrillation with controlled rate and CT head chest x-rays are unremarkable with INR being mildly supratherapeutic. Patient otherwise denies any chest pain shortness of breath nausea vomiting abdominal pain diarrhea or any dizziness. Patient's daughter states that patient uses a walker and she has a poor gait and balance.  Assessment/Plan: Left hip pain status post fall CT scan did not show any evidence of acute fracture or hardware dislocation. MRI reassuring. PT/OT saw patient and they recommended skilled nursing facility. Pain controlled with pain medications.  Left sided rib/chest pain post fall Rib films negative for fracture.  Left wrist pain - with swelling Plain x-ray negative for fracture.  Atrial fibrillation Rate controlled.  Anticoagulated on coumadin, therapeutic INR.  Bilateral LE recurrent cellulitis/Chronic venous stasis changes Per patient an ongoing problem. Started on Keflex on 05/02/2012. To me they appear to be due to chronic venous stasis changes. Define a 10 day course of antibiotics.  History of heart failure with preserved systolic function, chronic diastolic heart failure Currently  compensated, continue diuretics.  Constipation Resolved with suppository.  Code Status: Full Family Communication: No family present. Disposition Plan: Skilled nursing facility.   Consultants:  Orthopedics  Procedures:  None  Antibiotics:  Cephalexin 05/02/2012 >>  HPI/Subjective: Pain continues to improve from admission. Had a bowel movement yesterday.  Objective: Filed Vitals:   05/04/12 1000 05/04/12 1459 05/04/12 2113 05/05/12 0605  BP: 107/42 105/57 125/69 135/63  Pulse: 60 68 69 71  Temp: 98.2 F (36.8 C) 97.4 F (36.3 C) 98.6 F (37 C) 98.8 F (37.1 C)  TempSrc: Oral     Resp: 18 18 18 20   Height:      Weight:      SpO2: 100% 100% 96% 98%   No intake or output data in the 24 hours ending 05/05/12 0932 Filed Weights   05/02/12 0534 05/03/12 0500  Weight: 84.687 kg (186 lb 11.2 oz) 80.151 kg (176 lb 11.2 oz)    Exam:   General:  NAD  Cardiovascular: regular, without MRG  Respiratory: good air movement, clear to auscultation throughout, no wheezing, ronchi or rales  Abdomen: soft, not tender to palpation, positive bowel sounds  MSK: no peripheral edema. Tenderness in the left hip area. Therese appear swollen and mildly erythematous. Tender with movement.  Neuro: Neuro exam nonfocal.  Chest: Tender to palpation over the left bottom ribs.  Data Reviewed: Basic Metabolic Panel:  Recent Labs Lab 05/01/12 2312 05/03/12 0600  NA 140 139  K 4.3 4.1  CL 106 101  CO2 26 27  GLUCOSE 105* 82  BUN 26* 25*  CREATININE 0.84 0.93  CALCIUM 9.3 8.8   Liver Function Tests:  Recent Labs Lab 05/01/12 2312  AST 29  ALT 18  ALKPHOS 76  BILITOT 0.7  PROT 7.0  ALBUMIN 3.5   CBC:  Recent Labs Lab 05/01/12 2312 05/03/12 0600  WBC 8.2 6.8  NEUTROABS 5.6  --   HGB 13.6 13.3  HCT 41.2 38.2  MCV 92.8 89.9  PLT 201 143*   Studies: Dg Ribs Unilateral Left  05/04/2012  *RADIOLOGY REPORT*  Clinical Data: Left rib pain.  LEFT RIBS - 2 VIEW   Comparison: 05/01/2012  Findings: No acute bony abnormality.  No visible rib fracture.  No pneumothorax.  Mild cardiomegaly.  Left lung is clear.  No visible effusion.  IMPRESSION: No visible rib fractures, effusion or pneumothorax.  Advanced degenerative changes in the left glenohumeral joint.   Original Report Authenticated By: Charlett Nose, M.D.     Scheduled Meds: . cephALEXin  500 mg Oral Q12H  . metoprolol succinate  75 mg Oral BID  . omega-3 acid ethyl esters  1 g Oral Daily  . polyethylene glycol  17 g Oral Daily  . potassium chloride SA  20 mEq Oral BID  . senna-docusate  1 tablet Oral BID  . sodium chloride  3 mL Intravenous Q12H  . sodium chloride  3 mL Intravenous Q12H  . torsemide  20 mg Oral BID  . vitamin C  500 mg Oral Daily  . Warfarin - Pharmacist Dosing Inpatient   Does not apply q1800   Continuous Infusions:   Principal Problem:   Hip pain Active Problems:   HYPERTENSION   Atrial fibrillation   Larkin Ina, MD [facility] Pager [phone]. If 7 PM - 7 AM, please contact night-coverage at [email or web address], password [facility] 05/05/2012, 9:32 AM  LOS: 4 days

## 2012-05-05 NOTE — Progress Notes (Signed)
ANTICOAGULATION CONSULT NOTE - Follow Up Consult  Pharmacy Consult for Coumadin Indication: atrial fibrillation  Allergies  Allergen Reactions  . Meperidine Hcl Nausea Only  . Oxycodone-Acetaminophen Nausea Only    Patient Measurements: Height: 5\' 6"  (167.6 cm) Weight: 176 lb 11.2 oz (80.151 kg) IBW/kg (Calculated) : 59.3  Vital Signs: Temp: 98.8 F (37.1 C) (03/06 0605) BP: 102/62 mmHg (03/06 1136) Pulse Rate: 98 (03/06 1136)  Labs:  Recent Labs  05/02/12 1157 05/03/12 0600 05/04/12 0645 05/05/12 0400  HGB  --  13.3  --   --   HCT  --  38.2  --   --   PLT  --  143*  --   --   LABPROT  --  32.7* 23.3* 18.6*  INR  --  3.44* 2.18* 1.61*  CREATININE  --  0.93  --   --   TROPONINI <0.30  --   --   --     Estimated Creatinine Clearance: 45.5 ml/min (by C-G formula based on Cr of 0.93).  Assessment: 89yof on coumadin pta for afib, admitted with a supratherapeutic INR. Dose held 3/4, INR down to 2.18 on 3/5, and coumadin resumed 3/6 @ 0100. INR continues to decrease and is subtherapeutic today at 1.61. No CBC today. No bleeding reported.  Goal of Therapy:  INR 2-3 Monitor platelets by anticoagulation protocol: Yes   Plan:  1) Repeat coumadin 9mg  x 1 tonight 2) Follow up INR in AM  Fredrik Rigger 05/05/2012,11:40 AM

## 2012-05-05 NOTE — Clinical Social Work Note (Signed)
CSW was consulted to complete discharge of patient. Pt to transfer to [facility] today via [facility]. Facility and family are aware of d/c. D/C packet complete with chart copy, signed FL2, and signed hard Rx.  CSW signing off as no other CSW needs identified at this time.  Lia Foyer, LCSWA [facility] Clinical Social Worker Contact #: [phone]

## 2012-05-05 NOTE — Progress Notes (Signed)
Report called to nurse at [facility] for patient transfer.

## 2012-05-09 ENCOUNTER — Telehealth: Payer: Self-pay | Admitting: General Practice

## 2012-05-09 NOTE — Telephone Encounter (Signed)
Transitional care call attempted.  LMOM. 

## 2012-05-10 ENCOUNTER — Telehealth: Payer: Self-pay | Admitting: General Practice

## 2012-05-10 NOTE — Telephone Encounter (Signed)
Attempted to make transitional care call.  Unable to reach patient.  Left message on machine to return call to office.  Patient needs PT/INR as soon as possible and f/u appointment with Dr. Felicity Coyer.

## 2012-06-20 ENCOUNTER — Other Ambulatory Visit: Payer: Self-pay | Admitting: Pharmacist

## 2012-06-20 ENCOUNTER — Ambulatory Visit (INDEPENDENT_AMBULATORY_CARE_PROVIDER_SITE_OTHER): Payer: Medicare PPO | Admitting: Pharmacist

## 2012-06-20 ENCOUNTER — Encounter: Payer: Self-pay | Admitting: Pharmacist

## 2012-06-20 DIAGNOSIS — Z7901 Long term (current) use of anticoagulants: Secondary | ICD-10-CM

## 2012-06-20 DIAGNOSIS — I4891 Unspecified atrial fibrillation: Secondary | ICD-10-CM

## 2012-06-20 LAB — POCT INR: INR: 1.4

## 2012-06-20 MED ORDER — WARFARIN SODIUM 6 MG PO TABS: 6.0000 mg | ORAL_TABLET | ORAL | Status: DC

## 2012-07-01 ENCOUNTER — Ambulatory Visit (INDEPENDENT_AMBULATORY_CARE_PROVIDER_SITE_OTHER): Payer: Medicare PPO | Admitting: *Deleted

## 2012-07-01 DIAGNOSIS — I4891 Unspecified atrial fibrillation: Secondary | ICD-10-CM

## 2012-07-01 DIAGNOSIS — Z7901 Long term (current) use of anticoagulants: Secondary | ICD-10-CM

## 2012-07-01 LAB — POCT INR: INR: 3.3

## 2012-07-13 ENCOUNTER — Ambulatory Visit (INDEPENDENT_AMBULATORY_CARE_PROVIDER_SITE_OTHER): Payer: Medicare PPO | Admitting: *Deleted

## 2012-07-13 DIAGNOSIS — Z7901 Long term (current) use of anticoagulants: Secondary | ICD-10-CM

## 2012-07-13 DIAGNOSIS — I4891 Unspecified atrial fibrillation: Secondary | ICD-10-CM

## 2012-07-13 LAB — POCT INR: INR: 2.9

## 2012-07-20 ENCOUNTER — Ambulatory Visit (INDEPENDENT_AMBULATORY_CARE_PROVIDER_SITE_OTHER): Payer: Medicare PPO | Admitting: Cardiovascular Disease

## 2012-07-20 ENCOUNTER — Encounter: Payer: Self-pay | Admitting: Cardiovascular Disease

## 2012-07-20 VITALS — BP 106/62 | HR 53 | Ht 66.0 in | Wt 200.8 lb

## 2012-07-20 DIAGNOSIS — I10 Essential (primary) hypertension: Secondary | ICD-10-CM

## 2012-07-20 DIAGNOSIS — I4891 Unspecified atrial fibrillation: Secondary | ICD-10-CM

## 2012-07-20 DIAGNOSIS — R609 Edema, unspecified: Secondary | ICD-10-CM

## 2012-07-20 NOTE — Patient Instructions (Signed)
Your physician wants you to follow-up in:  6 MONTHS WITH DR NISHAN  You will receive a reminder letter in the mail two months in advance. If you don't receive a letter, please call our office to schedule the follow-up appointment. Your physician recommends that you continue on your current medications as directed. Please refer to the Current Medication list given to you today. 

## 2012-07-20 NOTE — Assessment & Plan Note (Signed)
Well controlled.  Continue current medications and low sodium Dash type diet.    

## 2012-07-20 NOTE — Assessment & Plan Note (Signed)
Good rate control  Told her son if she has 2 more minor falls or one major she will have to have her coumadin stopped

## 2012-07-20 NOTE — Progress Notes (Signed)
Patient ID: [name], female   DOB: 1922/11/30, 79 y.o.   MRN: [id number] Billi is seen today in followup for her chronic atrial fibrillation, anticoagulation, lower extremity edema. She is from [place] but has been with her brother here in [place] for most of the past year. she needs a primary care physician. Not in any significant palpitations. She is still a good candidate for Coumadin despite her age. Her INRs have been therapeutic in our Coumadin clinic. She had multiple orthopedic surgeries past few years . 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 Her left knee seems to be improving and she does not think she'll need surgery for it. She has a brace for her. She's had a distant history of diastolic dysfunction which has been quiet and. She has lower extremity edema from venous insuficiency. She does not like to wear here support hose. She has only been taking her diuretic once/day and some times not at all as it is hard for her to get to the bathroom  In rehab march after fall Had fluid on left hip  ROS: Denies fever, malais, weight loss, blurry vision, decreased visual acuity, cough, sputum, SOB, hemoptysis, pleuritic pain, palpitaitons, heartburn, abdominal pain, melena, lower extremity edema, claudication, or rash.  All other systems reviewed and negative  General: Affect appropriate Frail elderly female HEENT: normal Neck supple with no adenopathy JVP normal no bruits no thyromegaly Lungs clear with no wheezing and good diaphragmatic motion Heart:  S1/S2 no murmur, no rub, gallop or click PMI normal Abdomen: benighn, BS positve, no tenderness, no AAA no bruit.  No HSM or HJR Distal pulses intact with no bruits Plus 2 bilateral  Edema chronic venous insuficiency Neuro non-focal Skin warm and dry No muscular weakness   Current Outpatient Prescriptions  Medication Sig Dispense Refill  . calcium-vitamin D (OSCAL WITH D) 500-200 MG-UNIT per tablet Take 1  tablet by mouth daily.      . cholecalciferol (VITAMIN D) 1000 UNITS tablet Take 1,000 Units by mouth daily.      Marland Kitchen HYDROcodone-acetaminophen (NORCO/VICODIN) 5-325 MG per tablet Take 1 tablet by mouth every 6 (six) hours as needed for pain.      Marland Kitchen lidocaine (LIDODERM) 5 % Place 3 patches onto the skin daily. Remove & Discard patch within 12 hours or as directed by MD      . metoprolol succinate (TOPROL-XL) 50 MG 24 hr tablet Take 75 mg by mouth 2 (two) times daily. Take with or immediately following a meal.      . Multiple Vitamins-Minerals (CENTRUM PO) Take 1 tablet by mouth daily.      . naproxen sodium (ANAPROX) 220 MG tablet Take 220 mg by mouth 2 (two) times daily with a meal.      . Omega-3 Fatty Acids (FISH OIL PO) Take 1 tablet by mouth daily.      . polyethylene glycol (MIRALAX / GLYCOLAX) packet Take 17 g by mouth daily.  14 each    . potassium chloride SA (K-DUR,KLOR-CON) 20 MEQ tablet Take 1 tablet (20 mEq total) by mouth 2 (two) times daily.  60 tablet  12  . senna-docusate (SENOKOT-S) 8.6-50 MG per tablet Take 1 tablet by mouth 2 (two) times daily.      Marland Kitchen torsemide (DEMADEX) 20 MG tablet Take 20 mg by mouth 2 (two) times daily.      . vitamin C (ASCORBIC ACID) 500 MG tablet Take 500 mg by mouth daily.      Marland Kitchen  warfarin (COUMADIN) 6 MG tablet Take 1 tablet (6 mg total) by mouth as directed.  120 tablet  0   No current facility-administered medications for this visit.    Allergies  Meperidine hcl and Oxycodone-acetaminophen  Electrocardiogram:  05/02/11  Afib rate 71 otherwise normal   Assessment and Plan

## 2012-07-20 NOTE — Assessment & Plan Note (Signed)
Chronic venous insuf.  Discussed need for compliance with low sodium diet, support hose and demedex

## 2012-07-29 ENCOUNTER — Other Ambulatory Visit: Payer: Self-pay

## 2012-07-29 ENCOUNTER — Ambulatory Visit (INDEPENDENT_AMBULATORY_CARE_PROVIDER_SITE_OTHER): Payer: Medicare PPO

## 2012-07-29 DIAGNOSIS — Z7901 Long term (current) use of anticoagulants: Secondary | ICD-10-CM

## 2012-07-29 DIAGNOSIS — I4891 Unspecified atrial fibrillation: Secondary | ICD-10-CM

## 2012-07-29 LAB — POCT INR: INR: 2.9

## 2012-07-29 NOTE — Telephone Encounter (Signed)
Pt requesting 90 day supply of these medications sent to [facility] on [address] please.  Thanks

## 2012-08-01 ENCOUNTER — Other Ambulatory Visit: Payer: Self-pay | Admitting: Emergency Medicine

## 2012-08-01 MED ORDER — POTASSIUM CHLORIDE CRYS ER 20 MEQ PO TBCR: 20.0000 meq | EXTENDED_RELEASE_TABLET | Freq: Two times a day (BID) | ORAL | Status: DC

## 2012-08-01 MED ORDER — METOPROLOL SUCCINATE ER 50 MG PO TB24: 75.0000 mg | ORAL_TABLET | Freq: Two times a day (BID) | ORAL | Status: DC

## 2012-08-01 MED ORDER — TORSEMIDE 20 MG PO TABS: 20.0000 mg | ORAL_TABLET | Freq: Two times a day (BID) | ORAL | Status: DC

## 2012-08-01 NOTE — Telephone Encounter (Signed)
A user error has taken place: encounter opened in error, closed for administrative reasons.

## 2012-09-05 ENCOUNTER — Ambulatory Visit (INDEPENDENT_AMBULATORY_CARE_PROVIDER_SITE_OTHER): Payer: Medicare PPO | Admitting: *Deleted

## 2012-09-05 DIAGNOSIS — Z7901 Long term (current) use of anticoagulants: Secondary | ICD-10-CM

## 2012-09-05 DIAGNOSIS — I4891 Unspecified atrial fibrillation: Secondary | ICD-10-CM

## 2012-09-05 LAB — POCT INR: INR: 3.2

## 2012-10-03 ENCOUNTER — Ambulatory Visit (INDEPENDENT_AMBULATORY_CARE_PROVIDER_SITE_OTHER): Payer: Medicare PPO | Admitting: *Deleted

## 2012-10-03 DIAGNOSIS — Z7901 Long term (current) use of anticoagulants: Secondary | ICD-10-CM

## 2012-10-03 DIAGNOSIS — I4891 Unspecified atrial fibrillation: Secondary | ICD-10-CM

## 2012-10-03 LAB — POCT INR: INR: 2.8

## 2012-10-18 ENCOUNTER — Other Ambulatory Visit: Payer: Self-pay | Admitting: *Deleted

## 2012-10-18 MED ORDER — WARFARIN SODIUM 6 MG PO TABS: 6.0000 mg | ORAL_TABLET | ORAL | Status: DC

## 2012-11-03 ENCOUNTER — Ambulatory Visit (INDEPENDENT_AMBULATORY_CARE_PROVIDER_SITE_OTHER): Payer: Medicare PPO | Admitting: *Deleted

## 2012-11-03 DIAGNOSIS — Z7901 Long term (current) use of anticoagulants: Secondary | ICD-10-CM

## 2012-11-03 DIAGNOSIS — I4891 Unspecified atrial fibrillation: Secondary | ICD-10-CM

## 2012-11-03 LAB — POCT INR: INR: 2.4

## 2012-12-02 ENCOUNTER — Ambulatory Visit (INDEPENDENT_AMBULATORY_CARE_PROVIDER_SITE_OTHER): Payer: Medicare PPO | Admitting: *Deleted

## 2012-12-02 DIAGNOSIS — I4891 Unspecified atrial fibrillation: Secondary | ICD-10-CM

## 2012-12-02 DIAGNOSIS — Z7901 Long term (current) use of anticoagulants: Secondary | ICD-10-CM

## 2012-12-02 LAB — POCT INR: INR: 2.7

## 2013-01-13 ENCOUNTER — Ambulatory Visit (INDEPENDENT_AMBULATORY_CARE_PROVIDER_SITE_OTHER): Payer: Medicare PPO | Admitting: *Deleted

## 2013-01-13 DIAGNOSIS — I4891 Unspecified atrial fibrillation: Secondary | ICD-10-CM

## 2013-01-13 DIAGNOSIS — Z7901 Long term (current) use of anticoagulants: Secondary | ICD-10-CM

## 2013-01-13 LAB — POCT INR: INR: 1.9

## 2013-01-13 MED ORDER — POTASSIUM CHLORIDE CRYS ER 20 MEQ PO TBCR: 20.0000 meq | EXTENDED_RELEASE_TABLET | Freq: Two times a day (BID) | ORAL | Status: DC

## 2013-02-02 ENCOUNTER — Other Ambulatory Visit: Payer: Self-pay | Admitting: Cardiovascular Disease

## 2013-02-20 ENCOUNTER — Ambulatory Visit (INDEPENDENT_AMBULATORY_CARE_PROVIDER_SITE_OTHER): Payer: Medicare PPO | Admitting: *Deleted

## 2013-02-20 DIAGNOSIS — Z7901 Long term (current) use of anticoagulants: Secondary | ICD-10-CM

## 2013-02-20 DIAGNOSIS — I4891 Unspecified atrial fibrillation: Secondary | ICD-10-CM

## 2013-02-20 LAB — POCT INR: INR: 2.6

## 2013-03-13 ENCOUNTER — Other Ambulatory Visit: Payer: Self-pay

## 2013-03-13 MED ORDER — METOPROLOL SUCCINATE ER 50 MG PO TB24: 75.0000 mg | ORAL_TABLET | Freq: Two times a day (BID) | ORAL | Status: DC

## 2013-03-28 ENCOUNTER — Telehealth: Payer: Self-pay | Admitting: *Deleted

## 2013-03-28 MED ORDER — METOPROLOL SUCCINATE ER 100 MG PO TB24: 100.0000 mg | ORAL_TABLET | Freq: Two times a day (BID) | ORAL | Status: DC

## 2013-03-28 NOTE — Telephone Encounter (Signed)
Formulary change for [employer] per Maximino Greenland, Pharm D

## 2013-04-12 ENCOUNTER — Ambulatory Visit (INDEPENDENT_AMBULATORY_CARE_PROVIDER_SITE_OTHER): Payer: Medicare PPO | Admitting: Cardiovascular Disease

## 2013-04-12 ENCOUNTER — Ambulatory Visit (INDEPENDENT_AMBULATORY_CARE_PROVIDER_SITE_OTHER): Payer: Medicare PPO | Admitting: *Deleted

## 2013-04-12 ENCOUNTER — Encounter: Payer: Self-pay | Admitting: Cardiovascular Disease

## 2013-04-12 VITALS — BP 124/68 | HR 68

## 2013-04-12 DIAGNOSIS — I4891 Unspecified atrial fibrillation: Secondary | ICD-10-CM

## 2013-04-12 DIAGNOSIS — Z7901 Long term (current) use of anticoagulants: Secondary | ICD-10-CM

## 2013-04-12 DIAGNOSIS — M25559 Pain in unspecified hip: Secondary | ICD-10-CM

## 2013-04-12 DIAGNOSIS — R609 Edema, unspecified: Secondary | ICD-10-CM

## 2013-04-12 LAB — POCT INR: INR: 2.5

## 2013-04-12 MED ORDER — METOPROLOL TARTRATE 50 MG PO TABS: 50.0000 mg | ORAL_TABLET | Freq: Two times a day (BID) | ORAL | Status: DC

## 2013-04-12 NOTE — Assessment & Plan Note (Signed)
Well controlled.  Continue current medications and low sodium Dash type diet.    

## 2013-04-12 NOTE — Assessment & Plan Note (Signed)
Encouraged her to take demedex daily and wear support hose Limit salt

## 2013-04-12 NOTE — Assessment & Plan Note (Signed)
Good anticoagulation change to lopressor short acting 50 bid

## 2013-04-12 NOTE — Assessment & Plan Note (Signed)
F/u Mayer Camel PRN oxycodone otherwise Aleve  Mobility limited

## 2013-04-12 NOTE — Progress Notes (Signed)
Patient ID: MADDUX FIRST, female   DOB: June 14, 1922, 80 y.o.   MRN: [id number] [name] is seen today in followup for her chronic atrial fibrillation, anticoagulation, lower extremity edema. She is from [place] but has been with her brother here in [place] for most of the past year. she needs a primary care physician. Not in any significant palpitations. She is still a good candidate for Coumadin despite her age. Her INRs have been therapeutic in our Coumadin clinic. She had multiple orthopedic surgeries past few years . 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 Her left knee seems to be improving and she does not think she'll need surgery for it. She has a brace for her. She's had a distant history of diastolic dysfunction which has been quiet and. She has lower extremity edema from venous insuficiency. She does not like to wear here support hose. She has only been taking her diuretic once/day and some times not at all as it is hard for her to get to the bathroom  In rehab march after fall Had fluid on left hip   Unable to manage support hose Mix up with beta blocker should be on short acting not succinate No palpitations or bleeding issue    ROS: Denies fever, malais, weight loss, blurry vision, decreased visual acuity, cough, sputum, SOB, hemoptysis, pleuritic pain, palpitaitons, heartburn, abdominal pain, melena, lower extremity edema, claudication, or rash.  All other systems reviewed and negative  General: Affect appropriate Chronically ill white female  HEENT: normal Neck supple with no adenopathy JVP normal no bruits no thyromegaly Lungs clear with no wheezing and good diaphragmatic motion Heart:  S1/S2 SEM  murmur, no rub, gallop or click PMI normal Abdomen: benighn, BS positve, no tenderness, no AAA no bruit.  No HSM or HJR Distal pulses intact with no bruits Plus 2 bilateral edema with varicosities and stasis  Neuro non-focal Skin warm and dry No muscular  weakness   Current Outpatient Prescriptions  Medication Sig Dispense Refill  . calcium-vitamin D (OSCAL WITH D) 500-200 MG-UNIT per tablet Take 1 tablet by mouth daily.      . cholecalciferol (VITAMIN D) 1000 UNITS tablet Take 1,000 Units by mouth daily.      Marland Kitchen HYDROcodone-acetaminophen (NORCO/VICODIN) 5-325 MG per tablet Take 1 tablet by mouth every 6 (six) hours as needed for pain.      Marland Kitchen lidocaine (LIDODERM) 5 % Place 3 patches onto the skin daily. Remove & Discard patch within 12 hours or as directed by MD      . metoprolol succinate (TOPROL-XL) 50 MG 24 hr tablet 1 1/2 tab po bid      . Multiple Vitamins-Minerals (CENTRUM PO) Take 1 tablet by mouth daily.      . naproxen sodium (ANAPROX) 220 MG tablet Take 220 mg by mouth 2 (two) times daily with a meal.      . Omega-3 Fatty Acids (FISH OIL PO) Take 1 tablet by mouth daily.      . potassium chloride SA (K-DUR,KLOR-CON) 20 MEQ tablet Take 1 tablet (20 mEq total) by mouth 2 (two) times daily.  180 tablet  1  . torsemide (DEMADEX) 20 MG tablet Take 20 mg by mouth as needed.      . vitamin C (ASCORBIC ACID) 500 MG tablet Take 500 mg by mouth daily.      Marland Kitchen warfarin (COUMADIN) 6 MG tablet Take as directed by coumadin clinic  120 tablet  0   No current  facility-administered medications for this visit.    Allergies  Meperidine hcl and Oxycodone-acetaminophen  Electrocardiogram:  05/02/12  Afib rate 74 nonspecific ST/T wave changes   Assessment and Plan

## 2013-04-12 NOTE — Patient Instructions (Signed)
Your physician wants you to follow-up in: [place] will receive a reminder letter in the mail two months in advance. If you don't receive a letter, please call our office to schedule the follow-up appointment. SHOULD  BE   TAKING  METOPROLOL  TARTRATE  SHORT  ACTING  (LOPRESSOR)  50 MG  1  TAB  TWICE  DAILY

## 2013-05-19 ENCOUNTER — Other Ambulatory Visit: Payer: Self-pay | Admitting: Cardiovascular Disease

## 2013-05-23 ENCOUNTER — Other Ambulatory Visit: Payer: Self-pay

## 2013-05-23 MED ORDER — WARFARIN SODIUM 6 MG PO TABS: ORAL_TABLET | ORAL | Status: DC

## 2013-05-24 ENCOUNTER — Ambulatory Visit (INDEPENDENT_AMBULATORY_CARE_PROVIDER_SITE_OTHER): Payer: Medicare PPO | Admitting: *Deleted

## 2013-05-24 DIAGNOSIS — I4891 Unspecified atrial fibrillation: Secondary | ICD-10-CM

## 2013-05-24 DIAGNOSIS — Z7901 Long term (current) use of anticoagulants: Secondary | ICD-10-CM

## 2013-05-24 LAB — POCT INR: INR: 3.3

## 2013-06-21 ENCOUNTER — Ambulatory Visit (INDEPENDENT_AMBULATORY_CARE_PROVIDER_SITE_OTHER): Payer: Medicare PPO | Admitting: *Deleted

## 2013-06-21 DIAGNOSIS — I4891 Unspecified atrial fibrillation: Secondary | ICD-10-CM

## 2013-06-21 DIAGNOSIS — Z7901 Long term (current) use of anticoagulants: Secondary | ICD-10-CM

## 2013-06-21 LAB — POCT INR: INR: 2.7

## 2013-08-03 ENCOUNTER — Ambulatory Visit (INDEPENDENT_AMBULATORY_CARE_PROVIDER_SITE_OTHER): Payer: Medicare PPO | Admitting: Pharmacist

## 2013-08-03 DIAGNOSIS — Z7901 Long term (current) use of anticoagulants: Secondary | ICD-10-CM

## 2013-08-03 DIAGNOSIS — I4891 Unspecified atrial fibrillation: Secondary | ICD-10-CM

## 2013-08-03 LAB — POCT INR: INR: 3.1

## 2013-08-09 ENCOUNTER — Other Ambulatory Visit: Payer: Self-pay | Admitting: Cardiovascular Disease

## 2013-09-15 ENCOUNTER — Ambulatory Visit (INDEPENDENT_AMBULATORY_CARE_PROVIDER_SITE_OTHER): Payer: Medicare PPO | Admitting: *Deleted

## 2013-09-15 DIAGNOSIS — Z7901 Long term (current) use of anticoagulants: Secondary | ICD-10-CM

## 2013-09-15 DIAGNOSIS — Z5181 Encounter for therapeutic drug level monitoring: Secondary | ICD-10-CM

## 2013-09-15 DIAGNOSIS — I4891 Unspecified atrial fibrillation: Secondary | ICD-10-CM

## 2013-09-15 LAB — POCT INR: INR: 3.8

## 2013-10-03 ENCOUNTER — Ambulatory Visit (INDEPENDENT_AMBULATORY_CARE_PROVIDER_SITE_OTHER): Payer: Medicare PPO | Admitting: *Deleted

## 2013-10-03 DIAGNOSIS — I4891 Unspecified atrial fibrillation: Secondary | ICD-10-CM

## 2013-10-03 DIAGNOSIS — Z5181 Encounter for therapeutic drug level monitoring: Secondary | ICD-10-CM

## 2013-10-03 DIAGNOSIS — Z7901 Long term (current) use of anticoagulants: Secondary | ICD-10-CM

## 2013-10-03 LAB — POCT INR: INR: 2

## 2013-10-23 ENCOUNTER — Ambulatory Visit (INDEPENDENT_AMBULATORY_CARE_PROVIDER_SITE_OTHER): Payer: Medicare PPO | Admitting: Pharmacist

## 2013-10-23 ENCOUNTER — Encounter: Payer: Self-pay | Admitting: Cardiovascular Disease

## 2013-10-23 ENCOUNTER — Ambulatory Visit (INDEPENDENT_AMBULATORY_CARE_PROVIDER_SITE_OTHER): Payer: Medicare PPO | Admitting: Cardiovascular Disease

## 2013-10-23 VITALS — BP 132/84 | HR 73 | Ht 67.0 in | Wt 205.8 lb

## 2013-10-23 DIAGNOSIS — R609 Edema, unspecified: Secondary | ICD-10-CM

## 2013-10-23 DIAGNOSIS — I4891 Unspecified atrial fibrillation: Secondary | ICD-10-CM

## 2013-10-23 DIAGNOSIS — Z7901 Long term (current) use of anticoagulants: Secondary | ICD-10-CM

## 2013-10-23 DIAGNOSIS — I10 Essential (primary) hypertension: Secondary | ICD-10-CM

## 2013-10-23 DIAGNOSIS — Z5181 Encounter for therapeutic drug level monitoring: Secondary | ICD-10-CM

## 2013-10-23 LAB — POCT INR: INR: 2.4

## 2013-10-23 MED ORDER — TORSEMIDE 20 MG PO TABS: 20.0000 mg | ORAL_TABLET | Freq: Two times a day (BID) | ORAL | Status: DC

## 2013-10-23 NOTE — Progress Notes (Signed)
Patient ID: [name], female   DOB: 1922/05/10, 79 y.o.   MRN: [id number] [name] is seen today in followup for her chronic atrial fibrillation, anticoagulation, lower extremity edema. She is from [place] but has been with her brother here in [place] for most of the past year. she needs a primary care physician. Not in any significant palpitations. She is still a good candidate for Coumadin despite her age. Her INRs have been therapeutic in our Coumadin clinic. She had multiple orthopedic surgeries past few years . 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 Her left knee seems to be improving and she does not think she'll need surgery for it. She has a brace for her. She's had a distant history of diastolic dysfunction which has been quiet and. She has lower extremity edema from venous insuficiency. She does not like to wear here support hose. She has only been taking her diuretic once/day and some times not at all as it is hard for her to get to the bathroom  In rehab march after fall Had fluid on left hip  Unable to manage support hose    No palpitations or bleeding issue   She really does not like taking diuretic.  Discussed with nephew who lives with her on a farm 100 acres near [place]: Denies fever, malais, weight loss, blurry vision, decreased visual acuity, cough, sputum, SOB, hemoptysis, pleuritic pain, palpitaitons, heartburn, abdominal pain, melena, lower extremity edema, claudication, or rash.  All other systems reviewed and negative  General: Affect appropriate Overweight elderly white female HEENT: normal Neck supple with no adenopathy JVP normal no bruits no thyromegaly Lungs clear with no wheezing and good diaphragmatic motion Heart:  S1/S2 no murmur, no rub, gallop or click PMI normal Abdomen: benighn, BS positve, no tenderness, no AAA no bruit.  No HSM or HJR Distal pulses intact with no bruits Plus 3 bilateral edema Neuro non-focal Skin  warm and dry No muscular weakness   Current Outpatient Prescriptions  Medication Sig Dispense Refill  . calcium-vitamin D (OSCAL WITH D) 500-200 MG-UNIT per tablet Take 1 tablet by mouth daily.      . cholecalciferol (VITAMIN D) 1000 UNITS tablet Take 1,000 Units by mouth daily.      Marland Kitchen HYDROcodone-acetaminophen (NORCO/VICODIN) 5-325 MG per tablet Take 1 tablet by mouth every 6 (six) hours as needed for pain.      Marland Kitchen lidocaine (LIDODERM) 5 % Place 3 patches onto the skin daily. Remove & Discard patch within 12 hours or as directed by MD      . metoprolol (LOPRESSOR) 50 MG tablet Take 1 tablet (50 mg total) by mouth 2 (two) times daily.  180 tablet  3  . Multiple Vitamins-Minerals (CENTRUM PO) Take 1 tablet by mouth daily.      . naproxen sodium (ANAPROX) 220 MG tablet Take 220 mg by mouth 2 (two) times daily with a meal.      . Omega-3 Fatty Acids (FISH OIL PO) Take 1 tablet by mouth daily.      . potassium chloride SA (K-DUR,KLOR-CON) 20 MEQ tablet TAKE ONE TABLET BY MOUTH TWICE DAILY   180 tablet  0  . torsemide (DEMADEX) 20 MG tablet Take 20 mg by mouth as needed.      . vitamin C (ASCORBIC ACID) 500 MG tablet Take 500 mg by mouth daily.      Marland Kitchen warfarin (COUMADIN) 6 MG tablet 1 tablet daily except 1.5 tablets on  Mondays, Wednesdays and Fridays or as directed by coumadin clinic  120 tablet  1   No current facility-administered medications for this visit.    Allergies  Meperidine hcl and Oxycodone-acetaminophen  Electrocardiogram:  afib rate 71 nonspecific ST changes 3/14  Assessment and Plan

## 2013-10-23 NOTE — Assessment & Plan Note (Signed)
Echo to assess PA pressure RV and LV function She will try to take demedex bid  She is hesitant to do anything else at her age and does not like the diuretic because she goes to the bathroom constantly

## 2013-10-23 NOTE — Assessment & Plan Note (Signed)
Good rate control and anticoagulation  

## 2013-10-23 NOTE — Assessment & Plan Note (Signed)
Well controlled.  Continue current medications and low sodium Dash type diet.    

## 2013-10-23 NOTE — Patient Instructions (Signed)
Your physician wants you to follow-up in:  [place] will receive a reminder letter in the mail two months in advance. If you don't receive a letter, please call our office to schedule the follow-up appointment. Your physician has recommended you make the following change in your medication:  INCREASE TORSEMIDE  20 MG   TWICE DAILY  Your physician has requested that you have an echocardiogram. Echocardiography is a painless test that uses sound waves to create images of your heart. It provides your doctor with information about the size and shape of your heart and how well your heart's chambers and valves are working. This procedure takes approximately one hour. There are no restrictions for this procedure.

## 2013-10-26 ENCOUNTER — Other Ambulatory Visit: Payer: Self-pay

## 2013-10-26 MED ORDER — TORSEMIDE 20 MG PO TABS: 20.0000 mg | ORAL_TABLET | Freq: Two times a day (BID) | ORAL | Status: DC

## 2013-10-30 ENCOUNTER — Ambulatory Visit (HOSPITAL_COMMUNITY): Payer: Medicare PPO | Attending: Cardiovascular Disease | Admitting: Radiology

## 2013-10-30 DIAGNOSIS — R609 Edema, unspecified: Secondary | ICD-10-CM | POA: Diagnosis not present

## 2013-10-30 DIAGNOSIS — I482 Chronic atrial fibrillation, unspecified: Secondary | ICD-10-CM

## 2013-10-30 DIAGNOSIS — I4891 Unspecified atrial fibrillation: Secondary | ICD-10-CM | POA: Diagnosis present

## 2013-10-30 NOTE — Progress Notes (Signed)
Echocardiogram performed.  

## 2013-11-23 ENCOUNTER — Ambulatory Visit (INDEPENDENT_AMBULATORY_CARE_PROVIDER_SITE_OTHER): Payer: Medicare PPO

## 2013-11-23 DIAGNOSIS — Z7901 Long term (current) use of anticoagulants: Secondary | ICD-10-CM

## 2013-11-23 DIAGNOSIS — I4891 Unspecified atrial fibrillation: Secondary | ICD-10-CM

## 2013-11-23 DIAGNOSIS — Z5181 Encounter for therapeutic drug level monitoring: Secondary | ICD-10-CM

## 2013-11-23 LAB — POCT INR: INR: 2.4

## 2014-01-04 ENCOUNTER — Ambulatory Visit (INDEPENDENT_AMBULATORY_CARE_PROVIDER_SITE_OTHER): Payer: Medicare PPO

## 2014-01-04 DIAGNOSIS — Z5181 Encounter for therapeutic drug level monitoring: Secondary | ICD-10-CM

## 2014-01-04 DIAGNOSIS — Z7901 Long term (current) use of anticoagulants: Secondary | ICD-10-CM

## 2014-01-04 DIAGNOSIS — I4891 Unspecified atrial fibrillation: Secondary | ICD-10-CM

## 2014-01-04 LAB — POCT INR: INR: 2.4

## 2014-02-15 ENCOUNTER — Ambulatory Visit (INDEPENDENT_AMBULATORY_CARE_PROVIDER_SITE_OTHER): Payer: Medicare PPO | Admitting: *Deleted

## 2014-02-15 DIAGNOSIS — Z7901 Long term (current) use of anticoagulants: Secondary | ICD-10-CM

## 2014-02-15 DIAGNOSIS — I4891 Unspecified atrial fibrillation: Secondary | ICD-10-CM

## 2014-02-15 DIAGNOSIS — Z5181 Encounter for therapeutic drug level monitoring: Secondary | ICD-10-CM

## 2014-02-15 LAB — POCT INR: INR: 1.9

## 2014-04-04 ENCOUNTER — Ambulatory Visit (INDEPENDENT_AMBULATORY_CARE_PROVIDER_SITE_OTHER): Payer: Medicare PPO | Admitting: *Deleted

## 2014-04-04 DIAGNOSIS — I4891 Unspecified atrial fibrillation: Secondary | ICD-10-CM

## 2014-04-04 DIAGNOSIS — Z5181 Encounter for therapeutic drug level monitoring: Secondary | ICD-10-CM

## 2014-04-04 DIAGNOSIS — Z7901 Long term (current) use of anticoagulants: Secondary | ICD-10-CM

## 2014-04-04 LAB — POCT INR: INR: 2.4

## 2014-04-30 ENCOUNTER — Ambulatory Visit (INDEPENDENT_AMBULATORY_CARE_PROVIDER_SITE_OTHER): Payer: Medicare PPO | Admitting: Cardiovascular Disease

## 2014-04-30 ENCOUNTER — Encounter: Payer: Self-pay | Admitting: Cardiovascular Disease

## 2014-04-30 VITALS — BP 130/76 | HR 70 | Ht 68.0 in | Wt 211.0 lb

## 2014-04-30 DIAGNOSIS — R609 Edema, unspecified: Secondary | ICD-10-CM

## 2014-04-30 DIAGNOSIS — I482 Chronic atrial fibrillation, unspecified: Secondary | ICD-10-CM

## 2014-04-30 DIAGNOSIS — I10 Essential (primary) hypertension: Secondary | ICD-10-CM

## 2014-04-30 DIAGNOSIS — Z5181 Encounter for therapeutic drug level monitoring: Secondary | ICD-10-CM

## 2014-04-30 NOTE — Progress Notes (Signed)
Patient ID: [name], female   DOB: 07/30/1922, 81 y.o.   MRN: [id number] [name] is seen today in followup for her chronic atrial fibrillation, anticoagulation, lower extremity edema. She is from [place] but has been with her brother here in [place] for most of the past year. she needs a primary care physician. Not in any significant palpitations. She is still a good candidate for Coumadin despite her age. Her INRs have been therapeutic in our Coumadin clinic. She had multiple orthopedic surgeries past few years . 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 Her left knee seems to be improving and she does not think she'll need surgery for it. She has a brace for her. She's had a distant history of diastolic dysfunction which has been quiet and. She has lower extremity edema from venous insuficiency. She does not like to wear here support hose. She has only been taking her diuretic once/day and some times not at all as it is hard for her to get to the bathroom  In rehab march after fall Had fluid on left hip  Unable to manage support hose    No palpitations or bleeding issue   She really does not like taking diuretic.  Discussed with nephew who lives with her on a farm 100 acres near [employer]  No issues  Uses walker at home   Echo 8/Study Conclusions  - Left ventricle: The cavity size was normal. Wall thickness was increased in a pattern of mild LVH. Systolic function was normal. The estimated ejection fraction was in the range of 60% to 65%. Wall motion was normal; there were no regional wall motion abnormalities. - Aortic valve: There was trivial regurgitation. - Left atrium: The atrium was severely dilated.31/15   ROS: Denies fever, malais, weight loss, blurry vision, decreased visual acuity, cough, sputum, SOB, hemoptysis, pleuritic pain, palpitaitons, heartburn, abdominal pain, melena, lower extremity edema, claudication, or rash.  All other systems  reviewed and negative  General: Affect appropriate Overweight elderly white female HEENT: normal Neck supple with no adenopathy JVP normal no bruits no thyromegaly Lungs clear with no wheezing and good diaphragmatic motion Heart:  S1/S2 no murmur, no rub, gallop or click PMI normal Abdomen: benighn, BS positve, no tenderness, no AAA no bruit.  No HSM or HJR Distal pulses intact with no bruits Plus 3 bilateral edema Neuro non-focal Skin warm and dry No muscular weakness   Current Outpatient Prescriptions  Medication Sig Dispense Refill  . calcium-vitamin D (OSCAL WITH D) 500-200 MG-UNIT per tablet Take 1 tablet by mouth daily.    . cholecalciferol (VITAMIN D) 1000 UNITS tablet Take 1,000 Units by mouth daily.    Marland Kitchen HYDROcodone-acetaminophen (NORCO/VICODIN) 5-325 MG per tablet Take 1 tablet by mouth every 6 (six) hours as needed for pain.    . metoprolol (LOPRESSOR) 50 MG tablet Take 1 tablet (50 mg total) by mouth 2 (two) times daily. 180 tablet 3  . Multiple Vitamins-Minerals (CENTRUM PO) Take 1 tablet by mouth daily.    . naproxen sodium (ANAPROX) 220 MG tablet Take 220 mg by mouth 2 (two) times daily with a meal.    . Omega-3 Fatty Acids (FISH OIL PO) Take 1 tablet by mouth daily.    . potassium chloride SA (K-DUR,KLOR-CON) 20 MEQ tablet TAKE ONE TABLET BY MOUTH TWICE DAILY  180 tablet 0  . torsemide (DEMADEX) 20 MG tablet Take 1 tablet (20 mg total) by mouth 2 (two) times daily. 180 tablet 3  .  vitamin C (ASCORBIC ACID) 500 MG tablet Take 500 mg by mouth daily.    Marland Kitchen warfarin (COUMADIN) 6 MG tablet 1 tablet daily except 1.5 tablets on Mondays, Wednesdays and Fridays or as directed by coumadin clinic 120 tablet 1   No current facility-administered medications for this visit.    Allergies  Meperidine hcl and Oxycodone-acetaminophen   ECG  afib rate 71 nonspecific ST changes 3/14  04/30/14  Afib nonspecific ST changes rate 70 same as prior   Assessment and Plan

## 2014-04-30 NOTE — Assessment & Plan Note (Signed)
Good rate control and anticoagulation Despite age does not fall

## 2014-04-30 NOTE — Assessment & Plan Note (Signed)
Well controlled.  Continue current medications and low sodium Dash type diet.    

## 2014-04-30 NOTE — Assessment & Plan Note (Signed)
INR;'s Rx with no bleding diathesis

## 2014-04-30 NOTE — Patient Instructions (Signed)
Your physician wants you to follow-up in: YEAR WITH DR NISHAN  You will receive a reminder letter in the mail two months in advance. If you don't receive a letter, please call our office to schedule the follow-up appointment.  Your physician recommends that you continue on your current medications as directed. Please refer to the Current Medication list given to you today. 

## 2014-04-30 NOTE — Assessment & Plan Note (Signed)
Chronic venous with stasis continue to take lasix no change today

## 2014-05-04 ENCOUNTER — Other Ambulatory Visit: Payer: Self-pay | Admitting: Cardiovascular Disease

## 2014-05-11 ENCOUNTER — Ambulatory Visit (INDEPENDENT_AMBULATORY_CARE_PROVIDER_SITE_OTHER): Payer: Medicare PPO | Admitting: *Deleted

## 2014-05-11 DIAGNOSIS — Z7901 Long term (current) use of anticoagulants: Secondary | ICD-10-CM

## 2014-05-11 DIAGNOSIS — I482 Chronic atrial fibrillation, unspecified: Secondary | ICD-10-CM

## 2014-05-11 DIAGNOSIS — Z5181 Encounter for therapeutic drug level monitoring: Secondary | ICD-10-CM

## 2014-05-11 DIAGNOSIS — I4891 Unspecified atrial fibrillation: Secondary | ICD-10-CM

## 2014-05-11 LAB — POCT INR: INR: 2.8

## 2014-06-22 ENCOUNTER — Ambulatory Visit (INDEPENDENT_AMBULATORY_CARE_PROVIDER_SITE_OTHER): Payer: Medicare PPO | Admitting: *Deleted

## 2014-06-22 DIAGNOSIS — Z5181 Encounter for therapeutic drug level monitoring: Secondary | ICD-10-CM

## 2014-06-22 DIAGNOSIS — I482 Chronic atrial fibrillation, unspecified: Secondary | ICD-10-CM

## 2014-06-22 DIAGNOSIS — I4891 Unspecified atrial fibrillation: Secondary | ICD-10-CM | POA: Diagnosis not present

## 2014-06-22 DIAGNOSIS — Z7901 Long term (current) use of anticoagulants: Secondary | ICD-10-CM

## 2014-06-22 LAB — POCT INR: INR: 2.3

## 2014-07-19 ENCOUNTER — Other Ambulatory Visit: Payer: Self-pay | Admitting: Cardiovascular Disease

## 2014-07-20 ENCOUNTER — Other Ambulatory Visit: Payer: Self-pay | Admitting: Cardiovascular Disease

## 2014-08-03 ENCOUNTER — Ambulatory Visit (INDEPENDENT_AMBULATORY_CARE_PROVIDER_SITE_OTHER): Payer: Medicare PPO

## 2014-08-03 ENCOUNTER — Other Ambulatory Visit: Payer: Self-pay | Admitting: *Deleted

## 2014-08-03 DIAGNOSIS — I482 Chronic atrial fibrillation, unspecified: Secondary | ICD-10-CM

## 2014-08-03 DIAGNOSIS — Z5181 Encounter for therapeutic drug level monitoring: Secondary | ICD-10-CM | POA: Diagnosis not present

## 2014-08-03 DIAGNOSIS — I4891 Unspecified atrial fibrillation: Secondary | ICD-10-CM

## 2014-08-03 DIAGNOSIS — Z7901 Long term (current) use of anticoagulants: Secondary | ICD-10-CM | POA: Diagnosis not present

## 2014-08-03 LAB — POCT INR: INR: 1.8

## 2014-08-03 MED ORDER — WARFARIN SODIUM 6 MG PO TABS: ORAL_TABLET | ORAL | Status: DC

## 2014-08-16 ENCOUNTER — Telehealth: Payer: Self-pay

## 2014-08-16 DIAGNOSIS — R2681 Unsteadiness on feet: Secondary | ICD-10-CM | POA: Diagnosis not present

## 2014-08-16 DIAGNOSIS — L989 Disorder of the skin and subcutaneous tissue, unspecified: Secondary | ICD-10-CM | POA: Diagnosis not present

## 2014-08-16 MED ORDER — METOPROLOL TARTRATE 25 MG PO TABS: ORAL_TABLET | ORAL | Status: DC

## 2014-08-16 NOTE — Telephone Encounter (Signed)
If she has been taking a beta blocker I would want her on short acting low dose bid 25mg  Confirm she is taking either toprol or lopressor now

## 2014-08-17 ENCOUNTER — Other Ambulatory Visit: Payer: Self-pay

## 2014-08-17 MED ORDER — METOPROLOL TARTRATE 25 MG PO TABS: 25.0000 mg | ORAL_TABLET | Freq: Two times a day (BID) | ORAL | Status: DC

## 2014-08-20 NOTE — Telephone Encounter (Signed)
LMTCB ./CY 

## 2014-08-22 NOTE — Telephone Encounter (Signed)
Left message to call back  

## 2014-08-22 NOTE — Telephone Encounter (Addendum)
Spoke with pt and she states that she has been taking a BB. Pt was already aware that Dr. Johnsie Cancel wanted her to take low dose 25mg  BID.  Pt in agreement.

## 2014-09-07 ENCOUNTER — Ambulatory Visit (INDEPENDENT_AMBULATORY_CARE_PROVIDER_SITE_OTHER): Payer: Medicare PPO | Admitting: *Deleted

## 2014-09-07 DIAGNOSIS — I4891 Unspecified atrial fibrillation: Secondary | ICD-10-CM

## 2014-09-07 DIAGNOSIS — I482 Chronic atrial fibrillation, unspecified: Secondary | ICD-10-CM

## 2014-09-07 DIAGNOSIS — Z5181 Encounter for therapeutic drug level monitoring: Secondary | ICD-10-CM

## 2014-09-07 DIAGNOSIS — Z7901 Long term (current) use of anticoagulants: Secondary | ICD-10-CM

## 2014-09-07 LAB — POCT INR: INR: 2.2

## 2014-10-19 ENCOUNTER — Ambulatory Visit (INDEPENDENT_AMBULATORY_CARE_PROVIDER_SITE_OTHER): Payer: Medicare PPO | Admitting: *Deleted

## 2014-10-19 DIAGNOSIS — I4891 Unspecified atrial fibrillation: Secondary | ICD-10-CM

## 2014-10-19 DIAGNOSIS — Z5181 Encounter for therapeutic drug level monitoring: Secondary | ICD-10-CM

## 2014-10-19 DIAGNOSIS — Z7901 Long term (current) use of anticoagulants: Secondary | ICD-10-CM | POA: Diagnosis not present

## 2014-10-19 DIAGNOSIS — I482 Chronic atrial fibrillation, unspecified: Secondary | ICD-10-CM

## 2014-10-19 LAB — POCT INR: INR: 2.7

## 2014-11-30 ENCOUNTER — Ambulatory Visit (INDEPENDENT_AMBULATORY_CARE_PROVIDER_SITE_OTHER): Payer: Medicare PPO | Admitting: *Deleted

## 2014-11-30 DIAGNOSIS — I4891 Unspecified atrial fibrillation: Secondary | ICD-10-CM

## 2014-11-30 DIAGNOSIS — I482 Chronic atrial fibrillation, unspecified: Secondary | ICD-10-CM

## 2014-11-30 DIAGNOSIS — Z7901 Long term (current) use of anticoagulants: Secondary | ICD-10-CM | POA: Diagnosis not present

## 2014-11-30 DIAGNOSIS — Z5181 Encounter for therapeutic drug level monitoring: Secondary | ICD-10-CM

## 2014-11-30 LAB — POCT INR: INR: 2.5

## 2015-01-02 ENCOUNTER — Other Ambulatory Visit: Payer: Self-pay | Admitting: Cardiovascular Disease

## 2015-01-11 ENCOUNTER — Ambulatory Visit (INDEPENDENT_AMBULATORY_CARE_PROVIDER_SITE_OTHER): Payer: Medicare PPO | Admitting: *Deleted

## 2015-01-11 DIAGNOSIS — I482 Chronic atrial fibrillation, unspecified: Secondary | ICD-10-CM

## 2015-01-11 DIAGNOSIS — I4891 Unspecified atrial fibrillation: Secondary | ICD-10-CM

## 2015-01-11 DIAGNOSIS — Z7901 Long term (current) use of anticoagulants: Secondary | ICD-10-CM | POA: Diagnosis not present

## 2015-01-11 DIAGNOSIS — Z5181 Encounter for therapeutic drug level monitoring: Secondary | ICD-10-CM

## 2015-01-11 LAB — POCT INR: INR: 2.9

## 2015-01-25 ENCOUNTER — Other Ambulatory Visit: Payer: Self-pay | Admitting: Cardiovascular Disease

## 2015-02-21 ENCOUNTER — Ambulatory Visit (INDEPENDENT_AMBULATORY_CARE_PROVIDER_SITE_OTHER): Payer: Medicare PPO | Admitting: *Deleted

## 2015-02-21 DIAGNOSIS — I4891 Unspecified atrial fibrillation: Secondary | ICD-10-CM

## 2015-02-21 DIAGNOSIS — Z7901 Long term (current) use of anticoagulants: Secondary | ICD-10-CM | POA: Diagnosis not present

## 2015-02-21 DIAGNOSIS — I482 Chronic atrial fibrillation, unspecified: Secondary | ICD-10-CM

## 2015-02-21 DIAGNOSIS — Z5181 Encounter for therapeutic drug level monitoring: Secondary | ICD-10-CM | POA: Diagnosis not present

## 2015-02-21 LAB — POCT INR: INR: 3.3

## 2015-03-21 ENCOUNTER — Ambulatory Visit (INDEPENDENT_AMBULATORY_CARE_PROVIDER_SITE_OTHER): Payer: Medicare Other | Admitting: Pharmacist

## 2015-03-21 DIAGNOSIS — Z7901 Long term (current) use of anticoagulants: Secondary | ICD-10-CM

## 2015-03-21 DIAGNOSIS — I4891 Unspecified atrial fibrillation: Secondary | ICD-10-CM | POA: Diagnosis not present

## 2015-03-21 DIAGNOSIS — I482 Chronic atrial fibrillation, unspecified: Secondary | ICD-10-CM

## 2015-03-21 DIAGNOSIS — Z5181 Encounter for therapeutic drug level monitoring: Secondary | ICD-10-CM | POA: Diagnosis not present

## 2015-03-21 LAB — POCT INR: INR: 3.4

## 2015-03-27 ENCOUNTER — Other Ambulatory Visit: Payer: Self-pay | Admitting: Cardiovascular Disease

## 2015-04-17 ENCOUNTER — Ambulatory Visit (INDEPENDENT_AMBULATORY_CARE_PROVIDER_SITE_OTHER): Payer: Medicare Other | Admitting: *Deleted

## 2015-04-17 DIAGNOSIS — I482 Chronic atrial fibrillation, unspecified: Secondary | ICD-10-CM

## 2015-04-17 DIAGNOSIS — Z5181 Encounter for therapeutic drug level monitoring: Secondary | ICD-10-CM | POA: Diagnosis not present

## 2015-04-17 DIAGNOSIS — Z7901 Long term (current) use of anticoagulants: Secondary | ICD-10-CM | POA: Diagnosis not present

## 2015-04-17 DIAGNOSIS — I4891 Unspecified atrial fibrillation: Secondary | ICD-10-CM | POA: Diagnosis not present

## 2015-04-17 LAB — POCT INR: INR: 2.6

## 2015-05-01 ENCOUNTER — Encounter: Payer: Self-pay | Admitting: Cardiovascular Disease

## 2015-05-01 ENCOUNTER — Ambulatory Visit (INDEPENDENT_AMBULATORY_CARE_PROVIDER_SITE_OTHER): Payer: Medicare Other | Admitting: Pharmacist

## 2015-05-01 ENCOUNTER — Ambulatory Visit (INDEPENDENT_AMBULATORY_CARE_PROVIDER_SITE_OTHER): Payer: Medicare Other | Admitting: Cardiovascular Disease

## 2015-05-01 VITALS — BP 140/90 | HR 67 | Ht 60.0 in

## 2015-05-01 DIAGNOSIS — I482 Chronic atrial fibrillation, unspecified: Secondary | ICD-10-CM

## 2015-05-01 DIAGNOSIS — I4891 Unspecified atrial fibrillation: Secondary | ICD-10-CM

## 2015-05-01 DIAGNOSIS — Z5181 Encounter for therapeutic drug level monitoring: Secondary | ICD-10-CM

## 2015-05-01 DIAGNOSIS — Z7901 Long term (current) use of anticoagulants: Secondary | ICD-10-CM

## 2015-05-01 LAB — POCT INR: INR: 3.1

## 2015-05-01 NOTE — Patient Instructions (Signed)

## 2015-05-01 NOTE — Progress Notes (Signed)
Patient ID: ITZY PEPITONE, female   DOB: 30-Nov-1922, 82 y.o.   MRN: [id number]   [name] is seen today in followup for her chronic atrial fibrillation, anticoagulation, lower extremity edema. She is from [place] but has been with her brother here in [place] for most of the past year. she needs a primary care physician. Not in any significant palpitations. She is still a good candidate for Coumadin despite her age. Her INRs have been therapeutic in our Coumadin clinic. She had multiple orthopedic surgeries past few years . 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 Her left knee seems to be improving and she does not think she'll need surgery for it. She has a brace for her. She's had a distant history of diastolic dysfunction which has been quiet and. She has lower extremity edema from venous insuficiency. She does not like to wear here support hose. She has only been taking her diuretic once/day and some times not at all as it is hard for her to get to the bathroom  In rehab march after fall Had fluid on left hip  Unable to manage support hose    No palpitations or bleeding issue   She really does not like taking diuretic.  Discussed with nephew who [place] with her [place] a [place] near [employer]  No issues  Uses walker at home   Echo 8/Study Conclusions  - Left ventricle: The cavity size was normal. Wall thickness was increased in a pattern of mild LVH. Systolic function was normal. The estimated ejection fraction was in the range of 60% to 65%. Wall motion was normal; there were no regional wall motion abnormalities. - Aortic valve: There was trivial regurgitation. - Left atrium: The atrium was severely dilated.31/15  Has a hard time getting around Edema bad due to non compliance INR today 3.3 will need to be checked again in 3 weeks  She also is refusing PT has had both hips replaced and right knee Left knee is unstable    ROS: Denies fever, malais,  weight loss, blurry vision, decreased visual acuity, cough, sputum, SOB, hemoptysis, pleuritic pain, palpitaitons, heartburn, abdominal pain, melena, lower extremity edema, claudication, or rash.  All other systems reviewed and negative  General: Affect appropriate Overweight elderly white female HEENT: normal Neck supple with no adenopathy JVP normal no bruits no thyromegaly Lungs clear with no wheezing and good diaphragmatic motion Heart:  S1/S2 no murmur, no rub, gallop or click PMI normal Abdomen: benighn, BS positve, no tenderness, no AAA no bruit.  No HSM or HJR Distal pulses intact with no bruits Plus 3 bilateral edema Neuro non-focal Skin warm and dry No muscular weakness   Current Outpatient Prescriptions  Medication Sig Dispense Refill  . calcium-vitamin D (OSCAL WITH D) 500-200 MG-UNIT per tablet Take 1 tablet by mouth daily.    . cholecalciferol (VITAMIN D) 1000 UNITS tablet Take 1,000 Units by mouth daily.    Marland Kitchen HYDROcodone-acetaminophen (NORCO/VICODIN) 5-325 MG per tablet Take 1 tablet by mouth every 6 (six) hours as needed for pain.    . metoprolol tartrate (LOPRESSOR) 25 MG tablet TAKE 1 TABLET BY MOUTH 2 TIMES DAILY. 90 tablet 0  . Multiple Vitamins-Minerals (CENTRUM PO) Take 1 tablet by mouth daily.    . naproxen sodium (ANAPROX) 220 MG tablet Take 220 mg by mouth 2 (two) times daily with a meal.    . Omega-3 Fatty Acids (FISH OIL PO) Take 1 tablet by mouth daily.    Marland Kitchen  potassium chloride SA (K-DUR,KLOR-CON) 20 MEQ tablet TAKE ONE TABLET BY MOUTH TWICE DAILY  180 tablet 3  . torsemide (DEMADEX) 20 MG tablet Take 1 tablet (20 mg total) by mouth 2 (two) times daily. 180 tablet 3  . vitamin C (ASCORBIC ACID) 500 MG tablet Take 500 mg by mouth daily.    Marland Kitchen warfarin (COUMADIN) 6 MG tablet TAKE AS DIRECTED BY COUMADIN CLINIC 130 tablet 1   No current facility-administered medications for this visit.    Allergies  Meperidine hcl and Oxycodone-acetaminophen   ECG   afib rate 71 nonspecific ST changes 3/14  04/30/14  Afib nonspecific ST changes rate 70 same as prior   Assessment and Plan Afib: good rate control and anticoagulation.   Anticoagulation  INR 3.3  Take 1/2 tab tonight f/u INR 3 weeks Edema: would be better if she would take her diuretic Ortho:  Left knee pain/arthritis not an operative candidate f/u primary for pain control    Jenkins Rouge

## 2015-05-08 ENCOUNTER — Other Ambulatory Visit: Payer: Self-pay | Admitting: Cardiovascular Disease

## 2015-05-23 ENCOUNTER — Ambulatory Visit (INDEPENDENT_AMBULATORY_CARE_PROVIDER_SITE_OTHER): Payer: Medicare Other | Admitting: *Deleted

## 2015-05-23 DIAGNOSIS — I482 Chronic atrial fibrillation, unspecified: Secondary | ICD-10-CM

## 2015-05-23 DIAGNOSIS — Z5181 Encounter for therapeutic drug level monitoring: Secondary | ICD-10-CM

## 2015-05-23 DIAGNOSIS — I4891 Unspecified atrial fibrillation: Secondary | ICD-10-CM

## 2015-05-23 DIAGNOSIS — Z7901 Long term (current) use of anticoagulants: Secondary | ICD-10-CM | POA: Diagnosis not present

## 2015-05-23 LAB — POCT INR: INR: 3

## 2015-05-24 ENCOUNTER — Inpatient Hospital Stay (HOSPITAL_COMMUNITY)
Admission: EM | Admit: 2015-05-24 | Discharge: 2015-05-30 | DRG: 378 | Disposition: A | Discharge status: Skilled Nursing Facility | Payer: Medicare Other | Attending: Internal Medicine | Admitting: Internal Medicine

## 2015-05-24 ENCOUNTER — Telehealth: Payer: Self-pay | Admitting: Cardiovascular Disease

## 2015-05-24 ENCOUNTER — Encounter (HOSPITAL_COMMUNITY): Payer: Self-pay | Admitting: *Deleted

## 2015-05-24 ENCOUNTER — Emergency Department (HOSPITAL_COMMUNITY): Payer: Medicare Other

## 2015-05-24 DIAGNOSIS — I5032 Chronic diastolic (congestive) heart failure: Secondary | ICD-10-CM | POA: Diagnosis present

## 2015-05-24 DIAGNOSIS — D509 Iron deficiency anemia, unspecified: Secondary | ICD-10-CM | POA: Diagnosis present

## 2015-05-24 DIAGNOSIS — M130 Polyarthritis, unspecified: Secondary | ICD-10-CM | POA: Diagnosis present

## 2015-05-24 DIAGNOSIS — T39395A Adverse effect of other nonsteroidal anti-inflammatory drugs [NSAID], initial encounter: Secondary | ICD-10-CM | POA: Diagnosis present

## 2015-05-24 DIAGNOSIS — M1712 Unilateral primary osteoarthritis, left knee: Secondary | ICD-10-CM | POA: Diagnosis present

## 2015-05-24 DIAGNOSIS — K649 Unspecified hemorrhoids: Secondary | ICD-10-CM | POA: Diagnosis present

## 2015-05-24 DIAGNOSIS — I10 Essential (primary) hypertension: Secondary | ICD-10-CM | POA: Diagnosis not present

## 2015-05-24 DIAGNOSIS — I4891 Unspecified atrial fibrillation: Secondary | ICD-10-CM | POA: Diagnosis present

## 2015-05-24 DIAGNOSIS — D62 Acute posthemorrhagic anemia: Secondary | ICD-10-CM | POA: Diagnosis present

## 2015-05-24 DIAGNOSIS — M25511 Pain in right shoulder: Secondary | ICD-10-CM | POA: Diagnosis present

## 2015-05-24 DIAGNOSIS — K921 Melena: Secondary | ICD-10-CM | POA: Diagnosis present

## 2015-05-24 DIAGNOSIS — Z7901 Long term (current) use of anticoagulants: Secondary | ICD-10-CM | POA: Diagnosis not present

## 2015-05-24 DIAGNOSIS — Z886 Allergy status to analgesic agent status: Secondary | ICD-10-CM | POA: Diagnosis not present

## 2015-05-24 DIAGNOSIS — C50911 Malignant neoplasm of unspecified site of right female breast: Secondary | ICD-10-CM | POA: Diagnosis present

## 2015-05-24 DIAGNOSIS — I482 Chronic atrial fibrillation: Secondary | ICD-10-CM | POA: Diagnosis not present

## 2015-05-24 DIAGNOSIS — M79605 Pain in left leg: Secondary | ICD-10-CM | POA: Diagnosis present

## 2015-05-24 DIAGNOSIS — K259 Gastric ulcer, unspecified as acute or chronic, without hemorrhage or perforation: Secondary | ICD-10-CM | POA: Diagnosis present

## 2015-05-24 DIAGNOSIS — K625 Hemorrhage of anus and rectum: Secondary | ICD-10-CM

## 2015-05-24 DIAGNOSIS — I872 Venous insufficiency (chronic) (peripheral): Secondary | ICD-10-CM | POA: Diagnosis present

## 2015-05-24 DIAGNOSIS — I5030 Unspecified diastolic (congestive) heart failure: Secondary | ICD-10-CM | POA: Diagnosis present

## 2015-05-24 DIAGNOSIS — K2970 Gastritis, unspecified, without bleeding: Secondary | ICD-10-CM | POA: Diagnosis present

## 2015-05-24 DIAGNOSIS — C50919 Malignant neoplasm of unspecified site of unspecified female breast: Secondary | ICD-10-CM | POA: Diagnosis present

## 2015-05-24 DIAGNOSIS — Z923 Personal history of irradiation: Secondary | ICD-10-CM | POA: Diagnosis not present

## 2015-05-24 DIAGNOSIS — R7989 Other specified abnormal findings of blood chemistry: Secondary | ICD-10-CM | POA: Diagnosis not present

## 2015-05-24 DIAGNOSIS — I110 Hypertensive heart disease with heart failure: Secondary | ICD-10-CM | POA: Diagnosis present

## 2015-05-24 DIAGNOSIS — Z8249 Family history of ischemic heart disease and other diseases of the circulatory system: Secondary | ICD-10-CM

## 2015-05-24 DIAGNOSIS — Z5181 Encounter for therapeutic drug level monitoring: Secondary | ICD-10-CM

## 2015-05-24 DIAGNOSIS — R778 Other specified abnormalities of plasma proteins: Secondary | ICD-10-CM | POA: Diagnosis present

## 2015-05-24 DIAGNOSIS — K254 Chronic or unspecified gastric ulcer with hemorrhage: Secondary | ICD-10-CM | POA: Diagnosis not present

## 2015-05-24 DIAGNOSIS — K922 Gastrointestinal hemorrhage, unspecified: Secondary | ICD-10-CM | POA: Diagnosis not present

## 2015-05-24 DIAGNOSIS — Y92019 Unspecified place in single-family (private) house as the place of occurrence of the external cause: Secondary | ICD-10-CM | POA: Diagnosis not present

## 2015-05-24 DIAGNOSIS — Z885 Allergy status to narcotic agent status: Secondary | ICD-10-CM

## 2015-05-24 DIAGNOSIS — D649 Anemia, unspecified: Secondary | ICD-10-CM

## 2015-05-24 HISTORY — DX: Gastrointestinal hemorrhage, unspecified: K92.2

## 2015-05-24 LAB — CBC
HCT: 23.1 % — ABNORMAL LOW (ref 36.0–46.0)
HCT: 20.5 % — ABNORMAL LOW (ref 36.0–46.0)
Hemoglobin: 7.4 g/dL — ABNORMAL LOW (ref 12.0–15.0)
Hemoglobin: 6.5 g/dL — CL (ref 12.0–15.0)
MCH: 30.2 pg (ref 26.0–34.0)
MCH: 29.4 pg (ref 26.0–34.0)
MCHC: 32 g/dL (ref 30.0–36.0)
MCHC: 31.7 g/dL (ref 30.0–36.0)
MCV: 94.3 fL (ref 78.0–100.0)
MCV: 92.8 fL (ref 78.0–100.0)
Platelets: 229 10*3/uL (ref 150–400)
Platelets: 216 10*3/uL (ref 150–400)
RBC: 2.45 MIL/uL — ABNORMAL LOW (ref 3.87–5.11)
RBC: 2.21 MIL/uL — ABNORMAL LOW (ref 3.87–5.11)
RDW: 15 % (ref 11.5–15.5)
RDW: 15.2 % (ref 11.5–15.5)
WBC: 12.3 10*3/uL — ABNORMAL HIGH (ref 4.0–10.5)
WBC: 12 10*3/uL — ABNORMAL HIGH (ref 4.0–10.5)

## 2015-05-24 LAB — COMPREHENSIVE METABOLIC PANEL
ALT: 16 U/L (ref 14–54)
AST: 26 U/L (ref 15–41)
Albumin: 2.6 g/dL — ABNORMAL LOW (ref 3.5–5.0)
Alkaline Phosphatase: 51 U/L (ref 38–126)
Anion gap: 14 (ref 5–15)
BUN: 95 mg/dL — ABNORMAL HIGH (ref 6–20)
CO2: 15 mmol/L — ABNORMAL LOW (ref 22–32)
Calcium: 8.8 mg/dL — ABNORMAL LOW (ref 8.9–10.3)
Chloride: 116 mmol/L — ABNORMAL HIGH (ref 101–111)
Creatinine, Ser: 0.95 mg/dL (ref 0.44–1.00)
GFR calc Af Amer: 59 mL/min — ABNORMAL LOW (ref >60)
GFR calc non Af Amer: 51 mL/min — ABNORMAL LOW (ref >60)
Glucose, Bld: 133 mg/dL — ABNORMAL HIGH (ref 65–99)
Potassium: 4.4 mmol/L (ref 3.5–5.1)
Sodium: 145 mmol/L (ref 135–145)
Total Bilirubin: 0.4 mg/dL (ref 0.3–1.2)
Total Protein: 5 g/dL — ABNORMAL LOW (ref 6.5–8.1)

## 2015-05-24 LAB — URINALYSIS, ROUTINE W REFLEX MICROSCOPIC
Bilirubin Urine: NEGATIVE
Glucose, UA: NEGATIVE mg/dL
Hgb urine dipstick: NEGATIVE
Ketones, ur: NEGATIVE mg/dL
Nitrite: NEGATIVE
Protein, ur: NEGATIVE mg/dL
Specific Gravity, Urine: 1.019 (ref 1.005–1.030)
pH: 5 (ref 5.0–8.0)

## 2015-05-24 LAB — URINE MICROSCOPIC-ADD ON

## 2015-05-24 LAB — TROPONIN I: Troponin I: 0.04 ng/mL — ABNORMAL HIGH (ref <0.031)

## 2015-05-24 LAB — PROTIME-INR
INR: 3.34 — ABNORMAL HIGH (ref 0.00–1.49)
Prothrombin Time: 33.2 seconds — ABNORMAL HIGH (ref 11.6–15.2)

## 2015-05-24 LAB — PREPARE RBC (CROSSMATCH)

## 2015-05-24 MED ORDER — SODIUM CHLORIDE 0.9% FLUSH
3.0000 mL | Freq: Two times a day (BID) | INTRAVENOUS | Status: DC
  Administered 2015-05-24 – 2015-05-30 (×11): 3 mL via INTRAVENOUS

## 2015-05-24 MED ORDER — VITAMIN K1 10 MG/ML IJ SOLN
5.0000 mg | Freq: Once | INTRAVENOUS | Status: AC
  Administered 2015-05-24: 5 mg via INTRAVENOUS
  Filled 2015-05-24: qty 0.5

## 2015-05-24 MED ORDER — CALCIUM CARBONATE-VITAMIN D 500-200 MG-UNIT PO TABS
1.0000 | ORAL_TABLET | Freq: Every day | ORAL | Status: DC
  Administered 2015-05-24 – 2015-05-30 (×7): 1 via ORAL
  Filled 2015-05-24 (×7): qty 1

## 2015-05-24 MED ORDER — SODIUM CHLORIDE 0.9 % IV BOLUS (SEPSIS): 500.0000 mL | Freq: Once | INTRAVENOUS | Status: DC

## 2015-05-24 MED ORDER — VITAMIN C 500 MG PO TABS
500.0000 mg | ORAL_TABLET | Freq: Every day | ORAL | Status: DC
  Administered 2015-05-24 – 2015-05-30 (×7): 500 mg via ORAL
  Filled 2015-05-24 (×7): qty 1

## 2015-05-24 MED ORDER — HYDROCODONE-ACETAMINOPHEN 5-325 MG PO TABS
1.0000 | ORAL_TABLET | Freq: Four times a day (QID) | ORAL | Status: DC | PRN
Start: 2015-05-24 — End: 2015-05-30
  Administered 2015-05-25 – 2015-05-29 (×6): 1 via ORAL
  Filled 2015-05-24 (×6): qty 1

## 2015-05-24 MED ORDER — SODIUM CHLORIDE 0.9 % IV SOLN: INTRAVENOUS | Status: AC

## 2015-05-24 MED ORDER — VITAMIN D 1000 UNITS PO TABS
1000.0000 [IU] | ORAL_TABLET | Freq: Every day | ORAL | Status: DC
  Administered 2015-05-24 – 2015-05-30 (×7): 1000 [IU] via ORAL
  Filled 2015-05-24 (×8): qty 1

## 2015-05-24 MED ORDER — SODIUM CHLORIDE 0.9 % IV SOLN: Freq: Once | INTRAVENOUS | Status: DC

## 2015-05-24 MED ORDER — OMEGA-3-ACID ETHYL ESTERS 1 G PO CAPS
1.0000 g | ORAL_CAPSULE | Freq: Every day | ORAL | Status: DC
  Administered 2015-05-24 – 2015-05-30 (×7): 1 g via ORAL
  Filled 2015-05-24 (×7): qty 1

## 2015-05-24 MED ORDER — ONDANSETRON HCL 4 MG/2ML IJ SOLN: 4.0000 mg | Freq: Four times a day (QID) | INTRAMUSCULAR | Status: DC | PRN

## 2015-05-24 MED ORDER — CENTRUM PO TABS: 1.0000 | ORAL_TABLET | Freq: Every day | ORAL | Status: DC

## 2015-05-24 MED ORDER — ADULT MULTIVITAMIN W/MINERALS CH
1.0000 | ORAL_TABLET | Freq: Every day | ORAL | Status: DC
  Administered 2015-05-24 – 2015-05-30 (×7): 1 via ORAL
  Filled 2015-05-24 (×7): qty 1

## 2015-05-24 MED ORDER — PANTOPRAZOLE SODIUM 40 MG IV SOLR
40.0000 mg | Freq: Two times a day (BID) | INTRAVENOUS | Status: DC
  Administered 2015-05-24 – 2015-05-30 (×11): 40 mg via INTRAVENOUS
  Filled 2015-05-24 (×11): qty 40

## 2015-05-24 MED ORDER — ONDANSETRON HCL 4 MG PO TABS: 4.0000 mg | ORAL_TABLET | Freq: Four times a day (QID) | ORAL | Status: DC | PRN

## 2015-05-24 MED ORDER — MORPHINE SULFATE (PF) 2 MG/ML IV SOLN
1.0000 mg | INTRAVENOUS | Status: DC | PRN
Start: 2015-05-24 — End: 2015-05-30
  Administered 2015-05-26 – 2015-05-27 (×2): 1 mg via INTRAVENOUS
  Filled 2015-05-24 (×2): qty 1

## 2015-05-24 MED ORDER — METOPROLOL TARTRATE 25 MG PO TABS
25.0000 mg | ORAL_TABLET | Freq: Two times a day (BID) | ORAL | Status: DC
  Administered 2015-05-25 – 2015-05-27 (×4): 25 mg via ORAL
  Filled 2015-05-24 (×4): qty 1

## 2015-05-24 NOTE — H&P (Addendum)
[facility] History and Physical  [name] [id number] DOB: [birth year] DOA: 05/24/2015  Referring physician: ED physician PCP: Default, Provider, MD  Specialists:   Chief Complaint: GI bleeding and generalized weakness  HPI: [name] is a 82 y.o. female with PMH of atrial fibrillation on Coumadin, hypertension, diastolic congestive heart failure, right-sided breast cancer(S2/P of lumpectomy and radiation therapy), who presents with GI bleeding and generalized weakness.  Patient reports that she has been feeling very week in the past several days. She has sob with mild exertion. She saw little blood in stool 2 weeks ago, but no hematochezia or hematuria currently. Her nephew states that patient's blood pressure was low at 81/40 at home. Patient denies abdominal pain, nausea, vomiting, diarrhea, chest pain, fever or chills. No symptoms of UTI. No unilateral weakness. Pt also report R shoulder pain and left leg pain, but denies any injury. Per EDP, pt has dark bloody stool when he did rectal exam.   Of note, pt had colonoscopy 2000 at [facility] which showed hemorrhoid, otherwise negative. She state that she also had EGD at [facility] in 2000. She said that doctor had difficulty in passing the endoscopy through esophagus to stomach. She is not sure whether they actually looked at her stomach successfully.  In ED, patient was found to have hemoglobin dropped from 13.3 on 05/03/12--> 7.4, INR 3.34, WBC 12.3, temperature normal, soft blood pressure 97/53, electrolytes and renal function okay, negative chest x-ray for acute abnormalities , pending urinalysis. Patient is admitted to inpatient for further eval and treatment. GI will be consulted by EDP.  EKG: Independently reviewed. QTC 477, atrial fibrillation, mild T-wave inversion in inferior leads and V3 to V4, early R-wave progression  Where does patient live?   At home   Can patient participate in ADLs? Little   Review of  Systems:   General: no fevers, chills, no changes in body weight, has poor appetite, has fatigue HEENT: no blurry vision, hearing changes or sore throat Pulm: has dyspnea, no coughing, wheezing CV: no chest pain, no palpitations Abd: no nausea, vomiting, abdominal pain, diarrhea, constipation. GU: no dysuria, burning on urination, increased urinary frequency, hematuria  Ext: no leg edema Neuro: no unilateral weakness, numbness, or tingling, no vision change or hearing loss Skin: no rash MSK: has pain over R shoulder and Left leg Heme: No easy bruising.  Travel history: No recent long distant travel.  Allergy:  Allergies  Allergen Reactions  . Meperidine Hcl Nausea Only  . Oxycodone-Acetaminophen Nausea Only    Past Medical History  Diagnosis Date  . CARCINOMA, BREAST   . HYPERTENSION   . Atrial fibrillation ([place])   . Unspecified diastolic heart failure   . DEGENERATIVE JOINT DISEASE   . Edema   . CHEST PAIN   . Long term (current) use of anticoagulants     Past Surgical History  Procedure Laterality Date  . Knee surgery    . Hip surgery    . Breast lumpectomy    . Tonsillectomy    . Laminectomy      Social History:  reports that she has never smoked. She does not have any smokeless tobacco history on file. She reports that she does not drink alcohol or use illicit drugs.  Family History:  Family History  Problem Relation Age of Onset  . CAD Mother   . CAD Father   . Stroke Brother      Prior to Admission medications   Medication Sig Start  Date End Date Taking? Authorizing Provider  calcium-vitamin D (OSCAL WITH D) 500-200 MG-UNIT per tablet Take 1 tablet by mouth daily.    Historical Provider, MD  cholecalciferol (VITAMIN D) 1000 UNITS tablet Take 1,000 Units by mouth daily.    Historical Provider, MD  HYDROcodone-acetaminophen (NORCO/VICODIN) 5-325 MG per tablet Take 1 tablet by mouth every 6 (six) hours as needed for pain.    Historical Provider, MD   metoprolol tartrate (LOPRESSOR) 25 MG tablet TAKE 1 TABLET BY MOUTH 2 TIMES DAILY. 05/09/15   Josue Hector, MD  Multiple Vitamins-Minerals (CENTRUM PO) Take 1 tablet by mouth daily.    Historical Provider, MD  naproxen sodium (ANAPROX) 220 MG tablet Take 220 mg by mouth 2 (two) times daily with a meal.    Historical Provider, MD  Omega-3 Fatty Acids (FISH OIL PO) Take 1 tablet by mouth daily.    Historical Provider, MD  potassium chloride SA (K-DUR,KLOR-CON) 20 MEQ tablet TAKE ONE TABLET BY MOUTH TWICE DAILY  05/04/14   Josue Hector, MD  torsemide (DEMADEX) 20 MG tablet Take 1 tablet (20 mg total) by mouth 2 (two) times daily. 10/26/13   Josue Hector, MD  vitamin C (ASCORBIC ACID) 500 MG tablet Take 500 mg by mouth daily.    Historical Provider, MD  warfarin (COUMADIN) 6 MG tablet TAKE AS DIRECTED BY COUMADIN CLINIC 01/28/15   Josue Hector, MD    Physical Exam: Filed Vitals:   05/24/15 1830 05/24/15 1845 05/24/15 1900 05/24/15 1915  BP: 110/51 97/53 104/51 120/63  Pulse: 83 80 83 87  Temp:      TempSrc:      Resp: 20 18 17 25   SpO2: 100% 100% 100% 96%   General: Not in acute distress. Pale looking. HEENT:       Eyes: PERRL, EOMI, no scleral icterus.       ENT: No discharge from the ears and nose, no pharynx injection, no tonsillar enlargement.        Neck: No JVD, no bruit, no mass felt. Heme: No neck lymph node enlargement. Cardiac: S1/S2, RRR, No murmurs, No gallops or rubs. Pulm: No rales, wheezing, rhonchi or rubs. Abd: Soft, nondistended, nontender, no rebound pain, no organomegaly, BS present. Ext: 1+ pitting leg edema bilaterally and has venous insufficiency in legs.  2+DP/PT pulse bilaterally. Musculoskeletal: there is tenderness over right shoulder with limitation of right arm movement, also has tenderness over left lateral leg with skin bruise at the same location. Skin: No rashes.  Neuro: Alert, oriented X3, cranial nerves II-XII grossly intact, moves all extremities  normally. Psych: Patient is not psychotic, no suicidal or hemocidal ideation.  Labs on Admission:  Basic Metabolic Panel:  Recent Labs Lab 05/24/15 1714  NA 145  K 4.4  CL 116*  CO2 15*  GLUCOSE 133*  BUN 95*  CREATININE 0.95  CALCIUM 8.8*   Liver Function Tests:  Recent Labs Lab 05/24/15 1714  AST 26  ALT 16  ALKPHOS 51  BILITOT 0.4  PROT 5.0*  ALBUMIN 2.6*   No results for input(s): LIPASE, AMYLASE in the last 168 hours. No results for input(s): AMMONIA in the last 168 hours. CBC:  Recent Labs Lab 05/24/15 1714  WBC 12.3*  HGB 7.4*  HCT 23.1*  MCV 94.3  PLT 229   Cardiac Enzymes: No results for input(s): CKTOTAL, CKMB, CKMBINDEX, TROPONINI in the last 168 hours.  BNP (last 3 results) No results for input(s): BNP in the last  8760 hours.  ProBNP (last 3 results) No results for input(s): PROBNP in the last 8760 hours.  CBG: No results for input(s): GLUCAP in the last 168 hours.  Radiological Exams on Admission: Dg Chest 2 View  05/24/2015  CLINICAL DATA:  Generalized fatigue and weakness for several days. Shortness of breath. EXAM: CHEST  2 VIEW COMPARISON:  05/01/2012 FINDINGS: The cardiomediastinal silhouette is unchanged, with the cardiac silhouette remaining mildly enlarged. The lungs are well inflated. Vascular congestion and interstitial densities on the prior study have resolved. There is no evidence of acute airspace consolidation, edema, pleural effusion, or pneumothorax. Advanced degenerative changes are present about both shoulders, and right axillary surgical clips are noted. IMPRESSION: No active cardiopulmonary disease. Electronically Signed   By: Logan Bores M.D.   On: 05/24/2015 17:48    Assessment/Plan Principal Problem:   GIB (gastrointestinal bleeding) Active Problems:   Malignant neoplasm of female breast [facility])   Essential hypertension   Atrial fibrillation (HCC)   Diastolic heart failure ([place])   Encounter for therapeutic drug  monitoring   GI bleed   Right shoulder pain   Left leg pain  GIB (gastrointestinal bleeding): this is likely triggered by supertherapeutic INR 3.34 2/2 Coumadin use. Pt is on naproxen, which increased risk of gastric ulcer. bp was soft which is responding to IVF, current blood pressure is SBP>100. Hgb 13.3-->7.4. GI will be consulted by EDP.  - will admit to SDU - GI consulted by Ed, will follow up recommendations - NPO  - NS 100 mL/hr for 6 hours - 2 units of blood were ordered by EDP - Start IV pantoprazole 40 mg bib - Zofran IV for nausea - Avoid NSAIDs and SQ heparin-->hold naproxen - Maintain IV access (2 large bore IVs if possible). - Monitor closely and follow q6h cbc, transfuse as necessary. - INR/PTT/type & screen - check trop x 3 - give Vk 5 mg x 2  Addendum: trop is elevated at 0.04. No CP. Likely due to demanding ischemia. -will start lipitor -No ASA due to GIB -trop x 3 -2d echo in AM -check A1c and FLP   Atrial Fibrillation: CHA2DS2-VASc Score is 5, needs oral anticoagulation. Patient is on Coumadin at home. INR is supratherapeutic 3.34  on admission. Heart rate is well controlled. -Hold Coumadin due to GI bleeding -continue metoprolol with holding blood pressure parameter  HTN: -Hold lasix -continue metoprolol with holding blood pressure parameter  Hx of right sided breast cancer: s/p of lumpectomy and irradiation therapy 2002. Patient has been followed up by oncologist at [facility], last seen was on 04/03/15. She was told that her breast cancer is stable. -Follow-up with oncologist at [place] diastolic congestive heart failure: Patient is on prn torsemide at home. 2-D echo 10/30/13 showed EF 60-65%. Patient has 1+ leg edema, which is at least partially caused by venous insufficiency. Patient's respiratory functions okay, does not seem to have acute exacerbation -Hold diuretics due to soft blood pressure secondary to GI bleeding -Check BNP  Right shoulder  pain and Left leg pain: - X-ray of R shoulder and left tibia/fibula -pain control: prn Norco and morphine   DVT ppx: SCD  Code Status: Full code Family Communication: Yes, patient's nephew at bed side Disposition Plan: Admit to inpatient   Date of Service 05/24/2015    Ivor Costa [facility] Pager [phone]  If 7PM-7AM, please contact night-coverage [email or web address] Password [facility] 05/24/2015, 8:13 PM

## 2015-05-24 NOTE — ED Notes (Signed)
MD at bedside. 

## 2015-05-24 NOTE — ED Notes (Addendum)
Pt reports generalized fatigue and weakness for several days. Having sob with mild exertion. Appears very pale at triage, denies any bleeding or dark stools. Also reports low bp pta. bp is 113/70 at triage.

## 2015-05-24 NOTE — Progress Notes (Signed)
CRITICAL VALUE ALERT  Critical value received:  hgb 6.5  Date of notification:  03/24  Time of notification:  2118  Critical value read back:Yes  Nurse who received alert:  Remo Lipps, RN  Pt is currently receiving blood

## 2015-05-24 NOTE — ED Provider Notes (Signed)
CSN: [id number]     Arrival date & time 05/24/15  1643 History   First MD Initiated Contact with Patient 05/24/15 1702     Chief Complaint  Patient presents with  . Shortness of Breath  . Fatigue    Patient is a 82 y.o. female presenting with shortness of breath. The history is provided by the patient and a relative.  Shortness of Breath Associated symptoms: no abdominal pain, no chest pain, no headaches, no rash and no vomiting   Patient presents with shortness of breath and fatigue. Has been worse last couple days but has not been feeling good for a while now. Has had an occasional cough. No fall. No lack of bloody stools. Patient appears pale. She is on Coumadin for atrial fibrillation. She has a chronic pain in her left lower leg and right shoulder. She states she's not unusual for her. No dysuria. No headache. No confusion. Has chronic edema of both lower legs. INR was 3 yesterday. She is reportedly not been taking her Lasix as much recently since she was having difficulty making it to the bathroom.  Past Medical History  Diagnosis Date  . CARCINOMA, BREAST   . HYPERTENSION   . Atrial fibrillation ([place])   . Unspecified diastolic heart failure   . DEGENERATIVE JOINT DISEASE   . Edema   . CHEST PAIN   . Long term (current) use of anticoagulants    Past Surgical History  Procedure Laterality Date  . Knee surgery    . Hip surgery    . Breast lumpectomy    . Tonsillectomy    . Laminectomy     Family History  Problem Relation Age of Onset  . CAD Mother   . CAD Father   . Stroke Brother    Social History  Substance Use Topics  . Smoking status: Never Smoker   . Smokeless tobacco: None  . Alcohol Use: No   OB History    No data available     Review of Systems  Constitutional: Positive for fatigue. Negative for activity change and appetite change.  Eyes: Negative for pain.  Respiratory: Positive for shortness of breath. Negative for chest tightness.   Cardiovascular:  Positive for leg swelling. Negative for chest pain.  Gastrointestinal: Negative for nausea, vomiting, abdominal pain and diarrhea.  Genitourinary: Negative for flank pain.  Musculoskeletal: Negative for back pain and neck stiffness.       Right shoulder and left lower leg pains.  Skin: Negative for rash and wound.  Neurological: Positive for weakness. Negative for numbness and headaches.  Psychiatric/Behavioral: Negative for behavioral problems.      Allergies  Meperidine hcl and Oxycodone-acetaminophen  Home Medications   Prior to Admission medications   Medication Sig Start Date End Date Taking? Authorizing Provider  calcium-vitamin D (OSCAL WITH D) 500-200 MG-UNIT per tablet Take 1 tablet by mouth daily.    Historical Provider, MD  cholecalciferol (VITAMIN D) 1000 UNITS tablet Take 1,000 Units by mouth daily.    Historical Provider, MD  HYDROcodone-acetaminophen (NORCO/VICODIN) 5-325 MG per tablet Take 1 tablet by mouth every 6 (six) hours as needed for pain.    Historical Provider, MD  metoprolol tartrate (LOPRESSOR) 25 MG tablet TAKE 1 TABLET BY MOUTH 2 TIMES DAILY. 05/09/15   Josue Hector, MD  Multiple Vitamins-Minerals (CENTRUM PO) Take 1 tablet by mouth daily.    Historical Provider, MD  naproxen sodium (ANAPROX) 220 MG tablet Take 220 mg by mouth 2 (two)  times daily with a meal.    Historical Provider, MD  Omega-3 Fatty Acids (FISH OIL PO) Take 1 tablet by mouth daily.    Historical Provider, MD  potassium chloride SA (K-DUR,KLOR-CON) 20 MEQ tablet TAKE ONE TABLET BY MOUTH TWICE DAILY  05/04/14   Josue Hector, MD  torsemide (DEMADEX) 20 MG tablet Take 1 tablet (20 mg total) by mouth 2 (two) times daily. 10/26/13   Josue Hector, MD  vitamin C (ASCORBIC ACID) 500 MG tablet Take 500 mg by mouth daily.    Historical Provider, MD  warfarin (COUMADIN) 6 MG tablet TAKE AS DIRECTED BY COUMADIN CLINIC 01/28/15   Josue Hector, MD   BP 97/53 mmHg  Pulse 80  Temp(Src) 97.5 F (36.4  C) (Oral)  Resp 18  SpO2 100% Physical Exam  Constitutional: She appears well-developed.  HENT:  Head: Atraumatic.  Cardiovascular:  Irregular heartbeat with occasional tachycardia.  Pulmonary/Chest: Effort normal.  Abdominal: Soft. There is no tenderness.  Musculoskeletal: She exhibits edema.  Neurological: She is alert.  Skin: Skin is warm.    ED Course  Procedures (including critical care time) Labs Review Labs Reviewed  CBC - Abnormal; Notable for the following:    WBC 12.3 (*)    RBC 2.45 (*)    Hemoglobin 7.4 (*)    HCT 23.1 (*)    All other components within normal limits  COMPREHENSIVE METABOLIC PANEL - Abnormal; Notable for the following:    Chloride 116 (*)    CO2 15 (*)    Glucose, Bld 133 (*)    BUN 95 (*)    Calcium 8.8 (*)    Total Protein 5.0 (*)    Albumin 2.6 (*)    GFR calc non Af Amer 51 (*)    GFR calc Af Amer 59 (*)    All other components within normal limits  URINALYSIS, ROUTINE W REFLEX MICROSCOPIC (NOT AT [facility])  PROTIME-INR  POC OCCULT BLOOD, ED  TYPE AND SCREEN    Imaging Review Dg Chest 2 View  05/24/2015  CLINICAL DATA:  Generalized fatigue and weakness for several days. Shortness of breath. EXAM: CHEST  2 VIEW COMPARISON:  05/01/2012 FINDINGS: The cardiomediastinal silhouette is unchanged, with the cardiac silhouette remaining mildly enlarged. The lungs are well inflated. Vascular congestion and interstitial densities on the prior study have resolved. There is no evidence of acute airspace consolidation, edema, pleural effusion, or pneumothorax. Advanced degenerative changes are present about both shoulders, and right axillary surgical clips are noted. IMPRESSION: No active cardiopulmonary disease. Electronically Signed   By: Logan Bores M.D.   On: 05/24/2015 17:48   I have personally reviewed and evaluated these images and lab results as part of my medical decision-making.   EKG Interpretation   Date/Time:  Friday May 24 2015  17:01:18 EDT Ventricular Rate:  102 PR Interval:    QRS Duration: 82 QT Interval:  366 QTC Calculation: 477 R Axis:   67 Text Interpretation:  Atrial fibrillation with rapid ventricular response  ST \\T \ T wave abnormality, consider inferolateral ischemia Abnormal ECG  Confirmed by Alvino Chapel  MD, Ovid Curd [phone]) on 05/24/2015 5:24:57 PM      MDM   Final diagnoses:  Anemia, unspecified anemia type  Gastrointestinal hemorrhage, unspecified gastritis, unspecified gastrointestinal hemorrhage type    Patient presents with fatigue over last few days but worse the last day or 2. Found to have a hemoglobin of 7. Guaiac positive with black stool. On Coumadin for atrial  fibrillation. Will admit to internal medicine. INR was 3 yesterday.    Davonna Belling, MD 05/24/15 1850

## 2015-05-24 NOTE — Telephone Encounter (Signed)
New Message:    Please call asap,her blood pressure is 81/40 and no energy.

## 2015-05-24 NOTE — Telephone Encounter (Signed)
Called patient's nephew back about his aunt. Patient has no energy and BP is at 81/40. Patient has not eaten much according to nephew. Patient's nephew is driving patient to our office and is on [employer] as we speak.  With patient's age and lack of appointments this late in the day. Encouraged nephew to go to urgent care or ED. Informed patient's nephew that patient need to be evaluated, and did not think patient should wait until next week for an appointment.

## 2015-05-24 NOTE — ED Notes (Signed)
REturn from ct.

## 2015-05-25 ENCOUNTER — Inpatient Hospital Stay (HOSPITAL_COMMUNITY): Payer: Medicare Other

## 2015-05-25 DIAGNOSIS — R778 Other specified abnormalities of plasma proteins: Secondary | ICD-10-CM | POA: Diagnosis present

## 2015-05-25 DIAGNOSIS — R7989 Other specified abnormal findings of blood chemistry: Secondary | ICD-10-CM

## 2015-05-25 DIAGNOSIS — Z5181 Encounter for therapeutic drug level monitoring: Secondary | ICD-10-CM

## 2015-05-25 LAB — CBC
HCT: 23.1 % — ABNORMAL LOW (ref 36.0–46.0)
Hemoglobin: 7.6 g/dL — ABNORMAL LOW (ref 12.0–15.0)
MCH: 29.1 pg (ref 26.0–34.0)
MCHC: 32.9 g/dL (ref 30.0–36.0)
MCV: 88.5 fL (ref 78.0–100.0)
Platelets: 156 10*3/uL (ref 150–400)
RBC: 2.61 MIL/uL — ABNORMAL LOW (ref 3.87–5.11)
RDW: 16.1 % — ABNORMAL HIGH (ref 11.5–15.5)
WBC: 13 10*3/uL — ABNORMAL HIGH (ref 4.0–10.5)

## 2015-05-25 LAB — BASIC METABOLIC PANEL
Anion gap: 6 (ref 5–15)
BUN: 91 mg/dL — ABNORMAL HIGH (ref 6–20)
CO2: 22 mmol/L (ref 22–32)
Calcium: 8.7 mg/dL — ABNORMAL LOW (ref 8.9–10.3)
Chloride: 119 mmol/L — ABNORMAL HIGH (ref 101–111)
Creatinine, Ser: 0.75 mg/dL (ref 0.44–1.00)
GFR calc Af Amer: >60 mL/min (ref >60)
GFR calc non Af Amer: >60 mL/min (ref >60)
Glucose, Bld: 130 mg/dL — ABNORMAL HIGH (ref 65–99)
Potassium: 4.2 mmol/L (ref 3.5–5.1)
Sodium: 147 mmol/L — ABNORMAL HIGH (ref 135–145)

## 2015-05-25 LAB — LIPID PANEL
Cholesterol: 89 mg/dL (ref 0–200)
HDL: 21 mg/dL — ABNORMAL LOW (ref >40)
LDL Cholesterol: 29 mg/dL (ref 0–99)
Total CHOL/HDL Ratio: 4.2 RATIO
Triglycerides: 195 mg/dL — ABNORMAL HIGH (ref <150)
VLDL: 39 mg/dL (ref 0–40)

## 2015-05-25 LAB — PROTIME-INR
INR: 1.4 (ref 0.00–1.49)
Prothrombin Time: 17.2 seconds — ABNORMAL HIGH (ref 11.6–15.2)

## 2015-05-25 LAB — TROPONIN I: Troponin I: 0.03 ng/mL (ref <0.031)

## 2015-05-25 LAB — MRSA PCR SCREENING: MRSA by PCR: NEGATIVE

## 2015-05-25 MED ORDER — PROTHROMBIN COMPLEX CONC HUMAN 500 UNITS IV KIT: 25.0000 [IU]/kg | PACK | Status: DC

## 2015-05-25 MED ORDER — DEXTROSE 5 % IV SOLN
5.0000 mg | Freq: Once | INTRAVENOUS | Status: DC
Start: 2015-05-25 — End: 2015-05-25
  Filled 2015-05-25: qty 0.5

## 2015-05-25 MED ORDER — ATORVASTATIN CALCIUM 40 MG PO TABS: 40.0000 mg | ORAL_TABLET | Freq: Every day | ORAL | Status: DC

## 2015-05-25 MED ORDER — VITAMIN K1 10 MG/ML IJ SOLN: 10.0000 mg | INTRAVENOUS | Status: DC

## 2015-05-25 NOTE — Evaluation (Signed)
Physical Therapy Evaluation Patient Details Name: [name] MRN: [id number] DOB: Dec 13, 1922 Today's Date: 05/25/2015   History of Present Illness  Patient is a 82 yo female admitted 05/24/15 with GIB, weakness, anemia, dyspnea.  Patient with supratherapeutic INR.   PMH:  Afib, HTN, CHF, breast CA, Rt shoulder pain, LLE pain, chest pain.  Clinical Impression  Patient presents with problems listed below.  Will benefit from acute PT to maximize functional mobility prior to discharge.  Patient lives alone.  Requiring mod assist for mobility today.  Recommend ST-SNF for continued therapy at discharge.      Follow Up Recommendations SNF;Supervision for mobility/OOB    Equipment Recommendations  3in1 (PT)    Recommendations for Other Services       Precautions / Restrictions Precautions Precautions: Fall Restrictions Weight Bearing Restrictions: No      Mobility  Bed Mobility Overal bed mobility: Needs Assistance Bed Mobility: Supine to Sit     Supine to sit: Mod assist     General bed mobility comments: Verbal cues for technique.  Assist to raise trunk to sitting position and to move LE's off of bed.  Used bed pad to scoot patient to EOB in sitting.  Once upright, patient with fair sitting balance.  Transfers Overall transfer level: Needs assistance Equipment used: Rolling walker (2 wheeled) Transfers: Sit to/from [facility] Sit to Stand: Mod assist;+2 physical assistance Stand pivot transfers: Min assist;+2 physical assistance       General transfer comment: Verbal cues for hand placement and technique.  Assist to power up to standing and for balance.  Patient able to take several shuffle steps to pivot to chair.  Assist to control descent into chair.  Ambulation/Gait             General Gait Details: Declined  Stairs            Wheelchair Mobility    Modified Rankin (Stroke Patients Only)       Balance Overall balance  assessment: Needs assistance         Standing balance support: Bilateral upper extremity supported Standing balance-Leahy Scale: Poor                               Pertinent Vitals/Pain Pain Assessment: Faces Faces Pain Scale: Hurts whole lot Pain Location: Rt shoulder and LLE Pain Descriptors / Indicators: Aching;Grimacing;Guarding;Sore;Sharp Pain Intervention(s): Limited activity within patient's tolerance;Monitored during session;Repositioned    Home Living Family/patient expects to be discharged to:: Private residence Living Arrangements: Alone Available Help at Discharge: Family;Friend(s);Available PRN/intermittently (nephew) Type of Home: House Home Access: Stairs to enter Entrance Stairs-Rails: Psychiatric nurse of Steps: 2 Home Layout: One level Home Equipment: Walker - 2 wheels;Cane - single point      Prior Function Level of Independence: Independent with assistive device(s);Needs assistance   Gait / Transfers Assistance Needed: Uses RW for gait  ADL's / Homemaking Assistance Needed: Nephew assists with transportation, household, and checks on patient.  Friends assist with housekeeping and meals.  Patient takes sponge baths.        Hand Dominance   Dominant Hand: Right    Extremity/Trunk Assessment   Upper Extremity Assessment: Generalized weakness;RUE deficits/detail RUE Deficits / Details: Decreased shoulder ROM and strength due to pain.  Xray showed Progressive severe degenerative arthropathy - no fracture. RUE: Unable to fully assess due to pain       Lower Extremity Assessment:  Generalized weakness;LLE deficits/detail   LLE Deficits / Details: Strength grossly 3/5 - limited by pain lower leg.  Xray negative.     Communication   Communication: No difficulties  Cognition Arousal/Alertness: Awake/alert Behavior During Therapy: WFL for tasks assessed/performed;Anxious Overall Cognitive Status: Within Functional Limits  for tasks assessed                      General Comments      Exercises        Assessment/Plan    PT Assessment Patient needs continued PT services  PT Diagnosis Difficulty walking;Generalized weakness;Acute pain   PT Problem List Decreased strength;Decreased range of motion;Decreased activity tolerance;Decreased balance;Decreased mobility;Decreased knowledge of use of DME;Cardiopulmonary status limiting activity;Pain  PT Treatment Interventions DME instruction;Gait training;Functional mobility training;Therapeutic activities;Therapeutic exercise;Patient/family education   PT Goals (Current goals can be found in the Care Plan section) Acute Rehab PT Goals Patient Stated Goal: To go home PT Goal Formulation: With patient Time For Goal Achievement: 06/01/15 Potential to Achieve Goals: Fair    Frequency Min 3X/week   Barriers to discharge Decreased caregiver support Patient lives alone.    Co-evaluation               End of Session Equipment Utilized During Treatment: Gait belt Activity Tolerance: Patient limited by fatigue;Patient limited by pain Patient left: in chair;with call bell/phone within reach Nurse Communication: Mobility status         Time: [id number] PT Time Calculation (min) (ACUTE ONLY): 13 min   Charges:   PT Evaluation $PT Eval Moderate Complexity: 1 Procedure     PT G CodesDespina Pole 06-07-15, 12:09 PM Carita Pian. Sanjuana Kava, [place] Pager [phone]

## 2015-05-25 NOTE — Consult Note (Signed)
Referring Provider: Dr. Broadus John Primary Care Physician:  Default, Provider, MD Primary Gastroenterologist:  Althia Forts  Reason for Consultation:  Anemia; Heme positive stool  HPI: [name] is a 82 y.o. female with multiple medical problems and on chronic Coumadin (INR 3.34) being seen for a consult due to anemia and heme positive stool. She has been feeling very weak. Denies abdominal pain, nausea, vomiting, hematemesis, hematochezia, melena. Heme positive. Colonoscopy done years ago at [facility] that reportedly showed hemorrhoids. She recalls that an EGD was attempted and states that she thinks the doctor had difficulty with intubating the esophagus although details not known. Has been using Aleve daily for 3-4 months. Hgb 7.4. BP [id number] - [id number] systolic. Patient currently sitting in bedside chair and feels ok.   Past Medical History  Diagnosis Date  . CARCINOMA, BREAST   . HYPERTENSION   . Atrial fibrillation ([place])   . Unspecified diastolic heart failure   . DEGENERATIVE JOINT DISEASE   . Edema   . CHEST PAIN   . Long term (current) use of anticoagulants     Past Surgical History  Procedure Laterality Date  . Knee surgery    . Hip surgery    . Breast lumpectomy    . Tonsillectomy    . Laminectomy      Prior to Admission medications   Medication Sig Start Date End Date Taking? Authorizing Provider  calcium-vitamin D (OSCAL WITH D) 500-200 MG-UNIT per tablet Take 1 tablet by mouth daily.   Yes Historical Provider, MD  cholecalciferol (VITAMIN D) 1000 UNITS tablet Take 1,000 Units by mouth daily.   Yes Historical Provider, MD  HYDROcodone-acetaminophen (NORCO/VICODIN) 5-325 MG per tablet Take 1 tablet by mouth every 6 (six) hours as needed for pain.   Yes Historical Provider, MD  metoprolol tartrate (LOPRESSOR) 25 MG tablet TAKE 1 TABLET BY MOUTH 2 TIMES DAILY. 05/09/15  Yes Josue Hector, MD  Multiple Vitamins-Minerals (CENTRUM PO) Take 1 tablet by mouth daily.   Yes Historical  Provider, MD  naproxen sodium (ANAPROX) 220 MG tablet Take 220 mg by mouth 2 (two) times daily as needed (for pain).    Yes Historical Provider, MD  Omega-3 Fatty Acids (FISH OIL PO) Take 1 tablet by mouth daily.   Yes Historical Provider, MD  potassium chloride SA (K-DUR,KLOR-CON) 20 MEQ tablet TAKE ONE TABLET BY MOUTH TWICE DAILY  05/04/14  Yes Josue Hector, MD  torsemide (DEMADEX) 20 MG tablet Take 1 tablet (20 mg total) by mouth 2 (two) times daily. 10/26/13  Yes Josue Hector, MD  vitamin C (ASCORBIC ACID) 500 MG tablet Take 500 mg by mouth daily.   Yes Historical Provider, MD  warfarin (COUMADIN) 6 MG tablet TAKE AS DIRECTED BY COUMADIN CLINIC Patient taking differently: TAKES 9MG  ONLY ON FRI, TAKES 6MG  ALL OTHER DAYS 01/28/15  Yes Josue Hector, MD    Scheduled Meds: . sodium chloride   Intravenous Once  . atorvastatin  40 mg Oral q1800  . calcium-vitamin D  1 tablet Oral Daily  . cholecalciferol  1,000 Units Oral Daily  . metoprolol tartrate  25 mg Oral BID  . multivitamin with minerals  1 tablet Oral Daily  . omega-3 acid ethyl esters  1 g Oral Daily  . pantoprazole (PROTONIX) IV  40 mg Intravenous Q12H  . phytonadione (VITAMIN K) IV  5 mg Intravenous Once  . sodium chloride flush  3 mL Intravenous Q12H  . vitamin C  500 mg Oral Daily  Continuous Infusions:  PRN Meds:.HYDROcodone-acetaminophen, morphine injection, ondansetron **OR** ondansetron (ZOFRAN) IV  Allergies as of 05/24/2015 - Review Complete 05/24/2015  Allergen Reaction Noted  . Meperidine hcl Nausea Only 08/15/2008  . Oxycodone-acetaminophen Nausea Only 08/15/2008    Family History  Problem Relation Age of Onset  . CAD Mother   . CAD Father   . Stroke Brother     Social History   Social History  . Marital Status: Single    Spouse Name: N/A  . Number of Children: N/A  . Years of Education: N/A   Occupational History  . Not on file.   Social History Main Topics  . Smoking status: Never Smoker    . Smokeless tobacco: Not on file  . Alcohol Use: No  . Drug Use: No  . Sexual Activity: Not on file   Other Topics Concern  . Not on file   Social History Narrative    Review of Systems: All negative except as stated above in HPI.  Physical Exam: Vital signs: Filed Vitals:   05/25/15 0500 05/25/15 0900  BP: 106/46 95/56  Pulse: 69 80  Temp:  97.7 F (36.5 C)  Resp: 17 15   Last BM Date: 05/24/15 General:   Lethargic, elderly, frail, no acute distress Head: atraumatic Eyes: pupils equal and reactive ENT: oropharynx clear Neck: supple, nontender Lungs:  Clear throughout to auscultation.   No wheezes, crackles, or rhonchi. No acute distress. Heart:  Regular rate and rhythm; no murmurs, clicks, rubs,  or gallops. Abdomen: soft, nontender, nondistended, +BS  Rectal:  Deferred Ext: 1+ LE edema  GI:  Lab Results:  Recent Labs  05/24/15 1714 05/24/15 2050 05/25/15 0532  WBC 12.3* 12.0* 13.0*  HGB 7.4* 6.5* 7.6*  HCT 23.1* 20.5* 23.1*  PLT 229 216 156   BMET  Recent Labs  05/24/15 1714 05/25/15 0525  NA 145 147*  K 4.4 4.2  CL 116* 119*  CO2 15* 22  GLUCOSE 133* 130*  BUN 95* 91*  CREATININE 0.95 0.75  CALCIUM 8.8* 8.7*   LFT  Recent Labs  05/24/15 1714  PROT 5.0*  ALBUMIN 2.6*  AST 26  ALT 16  ALKPHOS 51  BILITOT 0.4   PT/INR  Recent Labs  05/24/15 1758 05/25/15 1046  LABPROT 33.2* 17.2*  INR 3.34* 1.40     Studies/Results: Dg Chest 2 View  05/24/2015  CLINICAL DATA:  Generalized fatigue and weakness for several days. Shortness of breath. EXAM: CHEST  2 VIEW COMPARISON:  05/01/2012 FINDINGS: The cardiomediastinal silhouette is unchanged, with the cardiac silhouette remaining mildly enlarged. The lungs are well inflated. Vascular congestion and interstitial densities on the prior study have resolved. There is no evidence of acute airspace consolidation, edema, pleural effusion, or pneumothorax. Advanced degenerative changes are present  about both shoulders, and right axillary surgical clips are noted. IMPRESSION: No active cardiopulmonary disease. Electronically Signed   By: Logan Bores M.D.   On: 05/24/2015 17:48   Dg Shoulder Right  05/25/2015  CLINICAL DATA:  General high shoulder pain. Pain with moving question RIGHT shoulder for 9 months. EXAM: RIGHT SHOULDER - 2+ VIEW COMPARISON:  Shoulder Radiograph 04/11/2007, chest radiograph 05/01/2012 FINDINGS: No acute fracture dislocation of the RIGHT shoulder. There is severe degenerative change of the glenohumeral joint with erosion of the humeral head and fragmentation of inferior aspect of the glenoid fossa. There is heterotrophic ossification. Findings are similar to but progressed from chest radiograph of 05/01/2012 and shoulder film of 04/11/2007. IMPRESSION:  Progressive severe degenerative arthropathy of the RIGHT shoulder joint. Electronically Signed   By: Suzy Bouchard M.D.   On: 05/25/2015 09:25   Dg Tibia/fibula Left  05/25/2015  CLINICAL DATA:  82 year old female with a history of calf pain for 4 years. EXAM: LEFT TIBIA AND FIBULA - 2 VIEW COMPARISON:  05/02/2012 FINDINGS: Diffuse osteopenia. Surgical changes of distal fibular open reduction internal fixation. Posttraumatic changes of the proximal fibula, with healed fracture site. Advanced degenerative changes of the knee joint. Degenerative changes of the ankle joint. No displaced fracture identified. IMPRESSION: Negative for acute bony abnormality. Osteopenia. Posttraumatic changes of the proximal fibula with surgical changes of open reduction internal fixation of distal fibula. Advanced degenerative changes of the knee and ankle. Signed, Dulcy Fanny. Earleen Newport, DO [facility] Radiology Electronically Signed   By: Corrie Mckusick D.O.   On: 05/25/2015 09:24    Impression/Plan: 82 yo with heme positive and anemia in the setting of an elevated INR (3.34) on Coumadin. No evidence of  active GI bleeding. Will recommend conservative management and hold off on invasive GI procedures unless anemia worsens or visible bleeding develops. Clear liquid diet ok for now. Supportive care. Will follow.    LOS: 1 day   [place] C.  05/25/2015, 12:02 PM  Pager [phone]  If no answer or after 5 PM call [phone]

## 2015-05-25 NOTE — Progress Notes (Signed)
T RH Progress Note                                            Patient Demographics:    [name], is a 82 y.o. female, DOB - 12/22/1922, [id number]  Admit date - 05/24/2015   Admitting Physician Ivor Costa, MD  Outpatient Primary MD for the patient is Default, Provider, MD  LOS - 1   Chief Complaint  Patient presents with  . Shortness of Breath  . Fatigue        Subjective:   No further bleeding, has R shoulder pain   Assessment  & Plan :   GIB (gastrointestinal bleeding): - likely triggered by supertherapeutic INR 3.34 2/2 Coumadin use.  -coumadin on hold, s/p 2 units PRBC, no overt bleeding -conservative management planned- -INR down to 1.4, hold off on Vitamin K -[facility]  Atrial Fibrillation: CHA2DS2-VASc Score is 5, needs oral anticoagulation. Patient is on Coumadin at home. INR was 3.34 on admission. Heart rate is well controlled. -Coumadin on hold -continue metoprolol with holding blood pressure parameter  HTN: -Hold lasix -continue metoprolol with holding blood pressure parameter  Hx of right sided breast cancer: s/p of lumpectomy and irradiation therapy 2002. Patient has been followed up by oncologist at [facility], last seen was on 04/03/15. She was told that her breast cancer is stable. -Follow-up with oncologist at [place] diastolic congestive heart failure: Patient is on prn torsemide at home. 2-D echo 10/30/13 showed EF 60-65%. Patient has 1+ leg edema, which is at least partially caused by venous insufficiency.  -Hold diuretics due to soft blood pressure secondary to GI bleeding -resume lasix in 1-2days  Right shoulder pain and Left leg pain: - X-ray of R shoulder -Progressive severe degenerative arthropathy  -pain control: prn Norco and morphine  DVT ppx: SCD  Code Status: Full code Family Communication:  none at bedside Disposition Plan: Admit to inpatient  Consults  :  GI   Lab Results  Component Value Date   PLT 156 05/25/2015    Anti-infectives    None        Objective:   Filed Vitals:   05/25/15 0500 05/25/15 0900 05/25/15 1000 05/25/15 1200  BP: 106/46 95/56 104/45 112/58  Pulse: 69 80 78 81  Temp:  97.7 F (36.5 C)    TempSrc:  Oral    Resp: 17 15 14 17   SpO2: 100% 100% 98% 100%    Wt Readings from Last 3 Encounters:  04/30/14 95.709 kg (211 lb)  10/23/13 93.35 kg (205 lb 12.8 oz)  07/20/12 91.082 kg (200 lb 12.8 oz)     Intake/Output Summary (Last 24 hours) at 05/25/15 1311 Last data filed at 05/25/15 0926  Gross per 24 hour  Intake    670 ml  Output    950 ml  Net   -280 ml     Physical Exam  Awake Alert, Oriented X 3, No new F.N deficits, Normal affect Slope.AT,PERRAL Supple Neck,No JVD, No cervical lymphadenopathy appriciated.  Symmetrical Chest wall movement, Good air movement bilaterally, CTAB RRR,No Gallops,Rubs or new Murmurs, No Parasternal Heave +ve B.Sounds, Abd Soft, No tenderness, No organomegaly appriciated, No rebound - guarding or rigidity. No Cyanosis, Clubbing or edema, No new Rash or bruise      Data Review:    CBC  Recent Labs Lab  05/24/15 1714 05/24/15 2050 05/25/15 0532  WBC 12.3* 12.0* 13.0*  HGB 7.4* 6.5* 7.6*  HCT 23.1* 20.5* 23.1*  PLT 229 216 156  MCV 94.3 92.8 88.5  MCH 30.2 29.4 29.1  MCHC 32.0 31.7 32.9  RDW 15.0 15.2 16.1*    Chemistries   Recent Labs Lab 05/24/15 1714 05/25/15 0525  NA 145 147*  K 4.4 4.2  CL 116* 119*  CO2 15* 22  GLUCOSE 133* 130*  BUN 95* 91*  CREATININE 0.95 0.75  CALCIUM 8.8* 8.7*  AST 26  --   ALT 16  --   ALKPHOS 51  --   BILITOT 0.4  --    ------------------------------------------------------------------------------------------------------------------  Recent Labs  05/25/15 0525  CHOL 89  HDL 21*  LDLCALC 29  TRIG 195*  CHOLHDL 4.2    No results found  for: HGBA1C ------------------------------------------------------------------------------------------------------------------ No results for input(s): TSH, T4TOTAL, T3FREE, THYROIDAB in the last 72 hours.  Invalid input(s): FREET3 ------------------------------------------------------------------------------------------------------------------ No results for input(s): VITAMINB12, FOLATE, FERRITIN, TIBC, IRON, RETICCTPCT in the last 72 hours.  Coagulation profile  Recent Labs Lab 05/23/15 1615 05/24/15 1758 05/25/15 1046  INR 3.0 3.34* 1.40    No results for input(s): DDIMER in the last 72 hours.  Cardiac Enzymes  Recent Labs Lab 05/24/15 2039 05/25/15 0525  TROPONINI 0.04* 0.03   ------------------------------------------------------------------------------------------------------------------ No results found for: BNP  Inpatient Medications  Scheduled Meds: . sodium chloride   Intravenous Once  . atorvastatin  40 mg Oral q1800  . calcium-vitamin D  1 tablet Oral Daily  . cholecalciferol  1,000 Units Oral Daily  . metoprolol tartrate  25 mg Oral BID  . multivitamin with minerals  1 tablet Oral Daily  . omega-3 acid ethyl esters  1 g Oral Daily  . pantoprazole (PROTONIX) IV  40 mg Intravenous Q12H  . sodium chloride flush  3 mL Intravenous Q12H  . vitamin C  500 mg Oral Daily   Continuous Infusions:  PRN Meds:.HYDROcodone-acetaminophen, morphine injection, ondansetron **OR** ondansetron (ZOFRAN) IV  Micro Results Recent Results (from the past 240 hour(s))  MRSA PCR Screening     Status: None   Collection Time: 05/24/15 10:06 PM  Result Value Ref Range Status   MRSA by PCR NEGATIVE NEGATIVE Final    Comment:        The GeneXpert MRSA Assay (FDA approved for NASAL specimens only), is one component of a comprehensive MRSA colonization surveillance program. It is not intended to diagnose MRSA infection nor to guide or monitor treatment for MRSA infections.       Radiology Reports Dg Chest 2 View  05/24/2015  CLINICAL DATA:  Generalized fatigue and weakness for several days. Shortness of breath. EXAM: CHEST  2 VIEW COMPARISON:  05/01/2012 FINDINGS: The cardiomediastinal silhouette is unchanged, with the cardiac silhouette remaining mildly enlarged. The lungs are well inflated. Vascular congestion and interstitial densities on the prior study have resolved. There is no evidence of acute airspace consolidation, edema, pleural effusion, or pneumothorax. Advanced degenerative changes are present about both shoulders, and right axillary surgical clips are noted. IMPRESSION: No active cardiopulmonary disease. Electronically Signed   By: Logan Bores M.D.   On: 05/24/2015 17:48   Dg Shoulder Right  05/25/2015  CLINICAL DATA:  General high shoulder pain. Pain with moving question RIGHT shoulder for 9 months. EXAM: RIGHT SHOULDER - 2+ VIEW COMPARISON:  Shoulder Radiograph 04/11/2007, chest radiograph 05/01/2012 FINDINGS: No acute fracture dislocation of the RIGHT shoulder. There is severe degenerative change  of the glenohumeral joint with erosion of the humeral head and fragmentation of inferior aspect of the glenoid fossa. There is heterotrophic ossification. Findings are similar to but progressed from chest radiograph of 05/01/2012 and shoulder film of 04/11/2007. IMPRESSION: Progressive severe degenerative arthropathy of the RIGHT shoulder joint. Electronically Signed   By: Suzy Bouchard M.D.   On: 05/25/2015 09:25   Dg Tibia/fibula Left  05/25/2015  CLINICAL DATA:  82 year old female with a history of calf pain for 4 years. EXAM: LEFT TIBIA AND FIBULA - 2 VIEW COMPARISON:  05/02/2012 FINDINGS: Diffuse osteopenia. Surgical changes of distal fibular open reduction internal fixation. Posttraumatic changes of the proximal fibula, with healed fracture site. Advanced degenerative changes of the knee joint. Degenerative changes of the ankle joint. No displaced fracture  identified. IMPRESSION: Negative for acute bony abnormality. Osteopenia. Posttraumatic changes of the proximal fibula with surgical changes of open reduction internal fixation of distal fibula. Advanced degenerative changes of the knee and ankle. Signed, Dulcy Fanny. Earleen Newport, DO [facility] Radiology Electronically Signed   By: Corrie Mckusick D.O.   On: 05/25/2015 09:24    Time Spent in minutes   33min   Latori Beggs M.D on 05/25/2015 at 1:11 PM  Between 7am to 7pm - Pager - [phone]  After 7pm go to [email or web address] - password [facility]  [facility] -  Office  [phone]

## 2015-05-26 LAB — BASIC METABOLIC PANEL
Anion gap: 6 (ref 5–15)
BUN: 55 mg/dL — ABNORMAL HIGH (ref 6–20)
CO2: 24 mmol/L (ref 22–32)
Calcium: 8.5 mg/dL — ABNORMAL LOW (ref 8.9–10.3)
Chloride: 116 mmol/L — ABNORMAL HIGH (ref 101–111)
Creatinine, Ser: 0.86 mg/dL (ref 0.44–1.00)
GFR calc Af Amer: >60 mL/min (ref >60)
GFR calc non Af Amer: 58 mL/min — ABNORMAL LOW (ref >60)
Glucose, Bld: 102 mg/dL — ABNORMAL HIGH (ref 65–99)
Potassium: 3.8 mmol/L (ref 3.5–5.1)
Sodium: 146 mmol/L — ABNORMAL HIGH (ref 135–145)

## 2015-05-26 LAB — CBC
HCT: 21.6 % — ABNORMAL LOW (ref 36.0–46.0)
HCT: 29.3 % — ABNORMAL LOW (ref 36.0–46.0)
Hemoglobin: 7 g/dL — ABNORMAL LOW (ref 12.0–15.0)
Hemoglobin: 9.4 g/dL — ABNORMAL LOW (ref 12.0–15.0)
MCH: 29.2 pg (ref 26.0–34.0)
MCH: 28.8 pg (ref 26.0–34.0)
MCHC: 32.4 g/dL (ref 30.0–36.0)
MCHC: 32.1 g/dL (ref 30.0–36.0)
MCV: 90 fL (ref 78.0–100.0)
MCV: 89.9 fL (ref 78.0–100.0)
Platelets: 159 10*3/uL (ref 150–400)
Platelets: 190 10*3/uL (ref 150–400)
RBC: 2.4 MIL/uL — ABNORMAL LOW (ref 3.87–5.11)
RBC: 3.26 MIL/uL — ABNORMAL LOW (ref 3.87–5.11)
RDW: 17.8 % — ABNORMAL HIGH (ref 11.5–15.5)
RDW: 17.7 % — ABNORMAL HIGH (ref 11.5–15.5)
WBC: 10.6 10*3/uL — ABNORMAL HIGH (ref 4.0–10.5)
WBC: 12.3 10*3/uL — ABNORMAL HIGH (ref 4.0–10.5)

## 2015-05-26 LAB — PROTIME-INR
INR: 1.26 (ref 0.00–1.49)
Prothrombin Time: 16 seconds — ABNORMAL HIGH (ref 11.6–15.2)

## 2015-05-26 LAB — HEMOGLOBIN AND HEMATOCRIT, BLOOD
HCT: 24 % — ABNORMAL LOW (ref 36.0–46.0)
HCT: 27.9 % — ABNORMAL LOW (ref 36.0–46.0)
Hemoglobin: 7.9 g/dL — ABNORMAL LOW (ref 12.0–15.0)
Hemoglobin: 9.6 g/dL — ABNORMAL LOW (ref 12.0–15.0)

## 2015-05-26 LAB — PREPARE RBC (CROSSMATCH)

## 2015-05-26 MED ORDER — SODIUM CHLORIDE 0.9 % IV SOLN
Freq: Once | INTRAVENOUS | Status: AC
  Administered 2015-05-26: 05:00:00 via INTRAVENOUS

## 2015-05-26 NOTE — Progress Notes (Signed)
T [facility] Progress Note                                            Patient Demographics:    [name], is a 82 y.o. female, DOB - 1922/11/02, [id number]  Admit date - 05/24/2015   Admitting Physician Ivor Costa, MD  Outpatient Primary MD for the patient is Default, Provider, MD  LOS - 2   Chief Complaint  Patient presents with  . Shortness of Breath  . Fatigue        Subjective:   No further bleeding, has R shoulder pain   Assessment  & Plan :   GIB (gastrointestinal bleeding): - likely triggered by supertherapeutic INR 3.34 2/2 Coumadin use.  -coumadin on hold, s/p 3 units PRBC so far, including 1 unit this am -INR down to 1.4, hold off on Vitamin K -[facility], plan for EGD Tuesday -monitor CBC q12  Atrial Fibrillation: CHA2DS2-VASc Score is 5, needs oral anticoagulation. Patient is on Coumadin at home. INR was 3.34 on admission. Heart rate is well controlled. -Coumadin on hold -continue metoprolol with holding blood pressure parameter  HTN: -Hold lasix -continue metoprolol with holding blood pressure parameter, BP still soft  Hx of right sided breast cancer: s/p of lumpectomy and irradiation therapy 2002. Patient has been followed up by oncologist at [facility], last seen was on 04/03/15. She was told that her breast cancer is stable. -Follow-up with oncologist at [place] diastolic congestive heart failure: Patient is on prn torsemide at home. 2-D echo 10/30/13 showed EF 60-65%. Patient has 1+ leg edema, which is at least partially caused by venous insufficiency.  -Hold diuretics due to soft blood pressure secondary to GI bleeding -resume lasix when BP more stable  Right shoulder pain and Left leg pain: - X-ray of R shoulder -Progressive severe degenerative arthropathy  -pain control: prn Norco and morphine  DVT ppx: SCD  Code  Status: Full code Family Communication: none at bedside Disposition Plan: Admit to inpatient  Consults  :  GI   Lab Results  Component Value Date   PLT 159 05/26/2015    Anti-infectives    None        Objective:   Filed Vitals:   05/26/15 1000 05/26/15 1050 05/26/15 1100 05/26/15 1200  BP: 110/61 110/61 124/62 97/59  Pulse: 72 70 82 68  Temp:    97.6 F (36.4 C)  TempSrc:    Oral  Resp: 21  15 16   SpO2: 100%  100% 100%    Wt Readings from Last 3 Encounters:  04/30/14 95.709 kg (211 lb)  10/23/13 93.35 kg (205 lb 12.8 oz)  07/20/12 91.082 kg (200 lb 12.8 oz)     Intake/Output Summary (Last 24 hours) at 05/26/15 1359 Last data filed at 05/26/15 1100  Gross per 24 hour  Intake    360 ml  Output    970 ml  Net   -610 ml     Physical Exam  Awake Alert, Oriented X 3, No new F.N deficits, Normal affect [place].AT,PERRAL Supple Neck,No JVD, No cervical lymphadenopathy appriciated.  Symmetrical Chest wall movement, Good air movement bilaterally, CTAB RRR,No Gallops,Rubs or new Murmurs, No Parasternal Heave +ve B.Sounds, Abd Soft, No tenderness, No organomegaly appriciated, No rebound - guarding or rigidity. No Cyanosis, Clubbing or edema, No new Rash or bruise  Data Review:    CBC  Recent Labs Lab 05/24/15 1714 05/24/15 2050 05/25/15 0532 05/26/15 0026 05/26/15 0400 05/26/15 1119  WBC 12.3* 12.0* 13.0*  --  10.6*  --   HGB 7.4* 6.5* 7.6* 7.9* 7.0* 9.6*  HCT 23.1* 20.5* 23.1* 24.0* 21.6* 27.9*  PLT 229 216 156  --  159  --   MCV 94.3 92.8 88.5  --  90.0  --   MCH 30.2 29.4 29.1  --  29.2  --   MCHC 32.0 31.7 32.9  --  32.4  --   RDW 15.0 15.2 16.1*  --  17.8*  --     Chemistries   Recent Labs Lab 05/24/15 1714 05/25/15 0525 05/26/15 0400  NA 145 147* 146*  K 4.4 4.2 3.8  CL 116* 119* 116*  CO2 15* 22 24  GLUCOSE 133* 130* 102*  BUN 95* 91* 55*  CREATININE 0.95 0.75 0.86  CALCIUM 8.8* 8.7* 8.5*  AST 26  --   --   ALT 16  --   --    ALKPHOS 51  --   --   BILITOT 0.4  --   --    ------------------------------------------------------------------------------------------------------------------  Recent Labs  05/25/15 0525  CHOL 89  HDL 21*  LDLCALC 29  TRIG 195*  CHOLHDL 4.2    No results found for: HGBA1C ------------------------------------------------------------------------------------------------------------------ No results for input(s): TSH, T4TOTAL, T3FREE, THYROIDAB in the last 72 hours.  Invalid input(s): FREET3 ------------------------------------------------------------------------------------------------------------------ No results for input(s): VITAMINB12, FOLATE, FERRITIN, TIBC, IRON, RETICCTPCT in the last 72 hours.  Coagulation profile  Recent Labs Lab 05/23/15 1615 05/24/15 1758 05/25/15 1046 05/26/15 0400  INR 3.0 3.34* 1.40 1.26    No results for input(s): DDIMER in the last 72 hours.  Cardiac Enzymes  Recent Labs Lab 05/24/15 2039 05/25/15 0525  TROPONINI 0.04* 0.03   ------------------------------------------------------------------------------------------------------------------ No results found for: BNP  Inpatient Medications  Scheduled Meds: . sodium chloride   Intravenous Once  . calcium-vitamin D  1 tablet Oral Daily  . cholecalciferol  1,000 Units Oral Daily  . metoprolol tartrate  25 mg Oral BID  . multivitamin with minerals  1 tablet Oral Daily  . omega-3 acid ethyl esters  1 g Oral Daily  . pantoprazole (PROTONIX) IV  40 mg Intravenous Q12H  . sodium chloride flush  3 mL Intravenous Q12H  . vitamin C  500 mg Oral Daily   Continuous Infusions:  PRN Meds:.HYDROcodone-acetaminophen, morphine injection, ondansetron **OR** ondansetron (ZOFRAN) IV  Micro Results Recent Results (from the past 240 hour(s))  MRSA PCR Screening     Status: None   Collection Time: 05/24/15 10:06 PM  Result Value Ref Range Status   MRSA by PCR NEGATIVE NEGATIVE Final     Comment:        The GeneXpert MRSA Assay (FDA approved for NASAL specimens only), is one component of a comprehensive MRSA colonization surveillance program. It is not intended to diagnose MRSA infection nor to guide or monitor treatment for MRSA infections.     Radiology Reports Dg Chest 2 View  05/24/2015  CLINICAL DATA:  Generalized fatigue and weakness for several days. Shortness of breath. EXAM: CHEST  2 VIEW COMPARISON:  05/01/2012 FINDINGS: The cardiomediastinal silhouette is unchanged, with the cardiac silhouette remaining mildly enlarged. The lungs are well inflated. Vascular congestion and interstitial densities on the prior study have resolved. There is no evidence of acute airspace consolidation, edema, pleural effusion, or pneumothorax. Advanced degenerative changes are present about  both shoulders, and right axillary surgical clips are noted. IMPRESSION: No active cardiopulmonary disease. Electronically Signed   By: Logan Bores M.D.   On: 05/24/2015 17:48   Dg Shoulder Right  05/25/2015  CLINICAL DATA:  General high shoulder pain. Pain with moving question RIGHT shoulder for 9 months. EXAM: RIGHT SHOULDER - 2+ VIEW COMPARISON:  Shoulder Radiograph 04/11/2007, chest radiograph 05/01/2012 FINDINGS: No acute fracture dislocation of the RIGHT shoulder. There is severe degenerative change of the glenohumeral joint with erosion of the humeral head and fragmentation of inferior aspect of the glenoid fossa. There is heterotrophic ossification. Findings are similar to but progressed from chest radiograph of 05/01/2012 and shoulder film of 04/11/2007. IMPRESSION: Progressive severe degenerative arthropathy of the RIGHT shoulder joint. Electronically Signed   By: Suzy Bouchard M.D.   On: 05/25/2015 09:25   Dg Tibia/fibula Left  05/25/2015  CLINICAL DATA:  82 year old female with a history of calf pain for 4 years. EXAM: LEFT TIBIA AND FIBULA - 2 VIEW COMPARISON:  05/02/2012 FINDINGS:  Diffuse osteopenia. Surgical changes of distal fibular open reduction internal fixation. Posttraumatic changes of the proximal fibula, with healed fracture site. Advanced degenerative changes of the knee joint. Degenerative changes of the ankle joint. No displaced fracture identified. IMPRESSION: Negative for acute bony abnormality. Osteopenia. Posttraumatic changes of the proximal fibula with surgical changes of open reduction internal fixation of distal fibula. Advanced degenerative changes of the knee and ankle. Signed, Dulcy Fanny. Earleen Newport, DO [facility] Electronically Signed   By: Corrie Mckusick D.O.   On: 05/25/2015 09:24    Time Spent in minutes   65min   Teliah Buffalo M.D on 05/26/2015 at 1:59 PM  Between 7am to 7pm - Pager - [phone]  After 7pm go to [email or web address] - password [facility]  [facility] -  Office  [phone]

## 2015-05-26 NOTE — Progress Notes (Signed)
Patient ID: [name], female   DOB: 1922/09/09, 82 y.o.   MRN: [id number] [facility] Gastroenterology Progress Note  [name] 82 y.o. 1923-01-06   Subjective: Feels ok. Sitting in chair eating breakfast. No complaints  Objective: Vital signs in last 24 hours: Filed Vitals:   05/26/15 0900 05/26/15 0917  BP: 118/44 93/55  Pulse: 85 76  Temp:  98.2 F (36.8 C)  Resp: 19 18    Physical Exam: Gen: alert, no acute distress, elderly, frail HEENT: anicteric CV: RRR Chest: CTA B Abd: soft, nontender, nondistended,   Lab Results:  Recent Labs  05/25/15 0525 05/26/15 0400  NA 147* 146*  K 4.2 3.8  CL 119* 116*  CO2 22 24  GLUCOSE 130* 102*  BUN 91* 55*  CREATININE 0.75 0.86  CALCIUM 8.7* 8.5*    Recent Labs  05/24/15 1714  AST 26  ALT 16  ALKPHOS 51  BILITOT 0.4  PROT 5.0*  ALBUMIN 2.6*    Recent Labs  05/25/15 0532 05/26/15 0026 05/26/15 0400  WBC 13.0*  --  10.6*  HGB 7.6* 7.9* 7.0*  HCT 23.1* 24.0* 21.6*  MCV 88.5  --  90.0  PLT 156  --  159    Recent Labs  05/25/15 1046 05/26/15 0400  LABPROT 17.2* 16.0*  INR 1.40 1.26      Assessment/Plan: Anemia without overt bleeding in the setting of chronic anticoagulation (at home). Due to worsening anemia would recommend an EGD and discussed risks/benefits of the procedure with the patient. Will plan to use Diprivan sedation by anesthesia and do procedure tomorrow. She is undecided about whether she wants to have it done and will discuss today with her nephew. If she agrees to do the EGD then will plan to do tomorrow if time available on endoscopy schedule. NPO p MN.   Sarepta C. 05/26/2015, 9:29 AM  Pager [phone]  If no answer or after 5 PM call [phone]

## 2015-05-26 NOTE — Progress Notes (Signed)
Patient ID: [name], female   DOB: 1922-03-10, 82 y.o.   MRN: [id number]  Discussed recommendation for EGD with family by phone this afternoon who were in her room and family and patient are agreeable to proceed. Patient needs Propofol sedation and first available slot is Tuesday (time to be determined). Will plan to do EGD Tuesday. Clear liquid diet today and tomorrow. NPO p MN tomorrow night for EGD Tuesday.

## 2015-05-26 NOTE — Progress Notes (Signed)
Utilization review completed.  

## 2015-05-27 LAB — TYPE AND SCREEN
ABO/RH(D): AB POS
Antibody Screen: NEGATIVE
Unit division: 0
Unit division: 0
Unit division: 0
Unit division: 0

## 2015-05-27 LAB — BASIC METABOLIC PANEL
Anion gap: 7 (ref 5–15)
BUN: 33 mg/dL — ABNORMAL HIGH (ref 6–20)
CO2: 23 mmol/L (ref 22–32)
Calcium: 8.4 mg/dL — ABNORMAL LOW (ref 8.9–10.3)
Chloride: 112 mmol/L — ABNORMAL HIGH (ref 101–111)
Creatinine, Ser: 0.78 mg/dL (ref 0.44–1.00)
GFR calc Af Amer: >60 mL/min (ref >60)
GFR calc non Af Amer: >60 mL/min (ref >60)
Glucose, Bld: 101 mg/dL — ABNORMAL HIGH (ref 65–99)
Potassium: 3.8 mmol/L (ref 3.5–5.1)
Sodium: 142 mmol/L (ref 135–145)

## 2015-05-27 LAB — PROTIME-INR
INR: 1.18 (ref 0.00–1.49)
Prothrombin Time: 15.2 seconds (ref 11.6–15.2)

## 2015-05-27 LAB — CBC
HCT: 24.2 % — ABNORMAL LOW (ref 36.0–46.0)
HCT: 26.6 % — ABNORMAL LOW (ref 36.0–46.0)
Hemoglobin: 8 g/dL — ABNORMAL LOW (ref 12.0–15.0)
Hemoglobin: 8.5 g/dL — ABNORMAL LOW (ref 12.0–15.0)
MCH: 29.5 pg (ref 26.0–34.0)
MCH: 29.1 pg (ref 26.0–34.0)
MCHC: 33.1 g/dL (ref 30.0–36.0)
MCHC: 32 g/dL (ref 30.0–36.0)
MCV: 89.3 fL (ref 78.0–100.0)
MCV: 91.1 fL (ref 78.0–100.0)
Platelets: 172 10*3/uL (ref 150–400)
Platelets: 209 10*3/uL (ref 150–400)
RBC: 2.71 MIL/uL — ABNORMAL LOW (ref 3.87–5.11)
RBC: 2.92 MIL/uL — ABNORMAL LOW (ref 3.87–5.11)
RDW: 18.1 % — ABNORMAL HIGH (ref 11.5–15.5)
RDW: 18.5 % — ABNORMAL HIGH (ref 11.5–15.5)
WBC: 9.8 10*3/uL (ref 4.0–10.5)
WBC: 11.3 10*3/uL — ABNORMAL HIGH (ref 4.0–10.5)

## 2015-05-27 LAB — GLUCOSE, CAPILLARY: Glucose-Capillary: 90 mg/dL (ref 65–99)

## 2015-05-27 LAB — HEMOGLOBIN A1C
Hgb A1c MFr Bld: 5.9 % — ABNORMAL HIGH (ref 4.8–5.6)
Mean Plasma Glucose: 123 mg/dL

## 2015-05-27 MED ORDER — METOPROLOL TARTRATE 12.5 MG HALF TABLET
12.5000 mg | ORAL_TABLET | Freq: Two times a day (BID) | ORAL | Status: DC
  Administered 2015-05-27 – 2015-05-29 (×4): 12.5 mg via ORAL
  Filled 2015-05-27 (×5): qty 1

## 2015-05-27 NOTE — Progress Notes (Signed)
Physical Therapy Treatment Patient Details Name: [name] MRN: [id number] DOB: Aug 11, 1922 Today's Date: 05/27/2015    History of Present Illness Patient is a 82 yo female admitted 05/24/15 with GIB, weakness, anemia, dyspnea.  Patient with supratherapeutic INR.   PMH:  Afib, HTN, CHF, breast CA, Rt shoulder pain, LLE pain, chest pain.    PT Comments    Pt admitted with above diagnosis. Pt currently with functional limitations due to pain, decreased strength, balance, and functional activity tolerance. Pt is capable of ambulation with RW, but very slow moving and needs help with sit to stand. Encouraged pt to walk to the commode across the room during the day to help build her strength and endurance back up. Nursing aware of mobility status. Pt will benefit from skilled PT to increase their independence and safety with mobility to allow discharge to the venue listed below.     Follow Up Recommendations  SNF;Supervision/Assistance - 24 hour     Equipment Recommendations  Other (comment) (TBD)    Recommendations for Other Services       Precautions / Restrictions Precautions Precautions: Fall Restrictions Weight Bearing Restrictions: No    Mobility  Bed Mobility               General bed mobility comments: Pt in chair upon entry to room  Transfers Overall transfer level: Needs assistance Equipment used: Rolling walker (2 wheeled) Transfers: Sit to/from Stand Sit to Stand: Mod assist;+2 physical assistance         General transfer comment: Verbal cues for sequencing and hand placement. Pt needs Mod A from lower surfaces but only Min A with elevated surfaces. Placed 2 pillows and 2 firm blankets under pt to assist with sit to stand.   Ambulation/Gait Ambulation/Gait assistance: Min guard Ambulation Distance (Feet): 30 Feet ([id number] with sitting rest break) Assistive device: Rolling walker (2 wheeled) Gait Pattern/deviations: Step-through pattern;Decreased  stride length;Shuffle;Antalgic;Trunk flexed;Wide base of support Gait velocity: very slow Gait velocity interpretation: Below normal speed for age/gender General Gait Details: Pt has very slow gait due to L LE pain and fatigue. Pt moaning/wincing the entire time with ambulation and states she just has no strength. Min Guard for safety.    Stairs            Wheelchair Mobility    Modified Rankin (Stroke Patients Only)       Balance Overall balance assessment: Needs assistance Sitting-balance support: No upper extremity supported;Feet supported Sitting balance-Leahy Scale: Fair     Standing balance support: Bilateral upper extremity supported Standing balance-Leahy Scale: Poor Standing balance comment: Reliant on UE support.                     Cognition Arousal/Alertness: Awake/alert Behavior During Therapy: WFL for tasks assessed/performed Overall Cognitive Status: Within Functional Limits for tasks assessed                      Exercises      General Comments General comments (skin integrity, edema, etc.): Pt c/o of her legs sticking together in standing and states that she has "knock knees" and it hurts when they rub together. OT applied Micoguard powder to decrease friction and discomfort with ambulation.       Pertinent Vitals/Pain Pain Assessment: Faces Faces Pain Scale: Hurts whole lot Pain Location: R shoulder in sitting, L LE with ambulation Pain Descriptors / Indicators: Discomfort;Guarding;Grimacing;Aching Pain Intervention(s): Limited activity within patient's tolerance;Monitored during session;Repositioned  Pre-Activity: HR: 76 bpm, O2 Sat: 100% on room air, RR: 14bpm, BP: 107/51 mmHg During-Activity: HR: 107bpm, O2 Sat: 99% on room air, RR: 19 bpm Post-Activity: HR: 86bpm, O2 Sat: 97% on room air, RR: 16 bpm     Home Living                      Prior Function            PT Goals (current goals can now be found in the  care plan section) Acute Rehab PT Goals Patient Stated Goal: to go home PT Goal Formulation: With patient Time For Goal Achievement: 06/08/15 Potential to Achieve Goals: Good Progress towards PT goals: Progressing toward goals    Frequency  Min 2X/week    PT Plan Frequency needs to be updated (per protocol 2x per week for SNF recs)    Co-evaluation             End of Session Equipment Utilized During Treatment: Gait belt Activity Tolerance: Patient limited by fatigue;Patient limited by pain Patient left: in chair;with call bell/phone within reach;with chair alarm set     Time: [id number] PT Time Calculation (min) (ACUTE ONLY): 26 min  Charges:  $Gait Training: 8-22 mins                    G Codes:      [name], SPT [name] 05/27/2015, 10:50 AM

## 2015-05-27 NOTE — Care Management Important Message (Signed)
Important Message  Patient Details  Name: [name] MRN: [id number] Date of Birth: 1922/08/18   Medicare Important Message Given:  Yes    Loann Quill 05/27/2015, 12:37 PM

## 2015-05-27 NOTE — Progress Notes (Signed)
Arrival Method: via bed Mental Status: alert and oriented x 4 Telemetry: applied, CCMD notified Tubes: n/a IV: LFA, NSL Pain: Denies Family: None present Living Situation: Home alone  Safety Measures: bed in lowest position, call bell in reach, non skid socks on, bed alarm on 6E Orientation: oriented to staff and unit

## 2015-05-27 NOTE — Evaluation (Signed)
Occupational Therapy Evaluation Patient Details Name: [name] MRN: [id number] DOB: [birth year] Today's Date: 05/27/2015    History of Present Illness Patient is a 82 yo female admitted 05/24/15 with GIB, weakness, anemia, dyspnea.  Patient with supratherapeutic INR.   PMH:  Afib, HTN, CHF, breast CA, Rt shoulder pain, LLE pain, chest pain.   Clinical Impression   PT admitted with GIB, weakness anemia and dyspnea. Pt currently with functional limitiations due to the deficits listed below (see OT problem list). PTA was living at home MOD I with nephew helping throughout the day. Received meals from friend occasionally.  Pt will benefit from skilled OT to increase their independence and safety with adls and balance to allow discharge SNF.     Follow Up Recommendations  SNF;Supervision/Assistance - 24 hour    Equipment Recommendations   (defer to SNF)    Recommendations for Other Services       Precautions / Restrictions Precautions Precautions: Fall      Mobility Bed Mobility               General bed mobility comments: in chair on arrival  Transfers Overall transfer level: Needs assistance Equipment used: Rolling walker (2 wheeled) Transfers: Sit to/from Stand Sit to Stand: Mod assist;+2 physical assistance         General transfer comment: Verbal cues for sequencing and hand placement. Pt needs Mod A from lower surfaces but only Min A with elevated surfaces. Placed 2 pillows and 2 firm blankets under pt to assist with sit to stand.     Balance Overall balance assessment: Needs assistance Sitting-balance support: No upper extremity supported;Feet supported Sitting balance-Leahy Scale: Fair     Standing balance support: Bilateral upper extremity supported;During functional activity Standing balance-Leahy Scale: Poor                              ADL Overall ADL's : Needs assistance/impaired Eating/Feeding: Set up;Sitting   Grooming:  Wash/dry face;Wash/dry hands;Set up;Sitting       Lower Body Bathing: Maximal assistance;Sit to/from stand Lower Body Bathing Details (indicate cue type and reason): pt with decr peri hygiene noted and an odor present. Question infection?         Toilet Transfer: +2 for physical assistance;Moderate assistance     Toileting - Clothing Manipulation Details (indicate cue type and reason): pt reports bil LE rubbing and skin friction feeling painful. Ot applied powder to inner legs. pt reports incr comfort with mobility. pt noted to have very adducted knees and thighs     Functional mobility during ADLs: Min guard;Rolling walker General ADL Comments: pt requires (A) for all transfers. pt at home uses a lift chair. pt noted to have decr personal hygiene and inability to complete standard chair transfer. Question ability to transfer off commode at home. pt is home > 8 hours alone at a time.      Vision     Perception     Praxis      Pertinent Vitals/Pain Pain Assessment: Faces Faces Pain Scale: Hurts whole lot Pain Location: R shoulder / L LE with mobility Pain Descriptors / Indicators: Discomfort Pain Intervention(s): Limited activity within patient's tolerance;Repositioned     Hand Dominance Right   Extremity/Trunk Assessment Upper Extremity Assessment Upper Extremity Assessment: RUE deficits/detail RUE Deficits / Details: decr AROM shoulder flexion. pt is able to push downward to (A) with transfers. pt reports being able to use  elbow flexion adducted toward body is less painful RUE: Unable to fully assess due to pain   Lower Extremity Assessment Lower Extremity Assessment: Defer to PT evaluation   Cervical / Trunk Assessment Cervical / Trunk Assessment: Kyphotic   Communication Communication Communication: No difficulties   Cognition Arousal/Alertness: Awake/alert Behavior During Therapy: WFL for tasks assessed/performed Overall Cognitive Status: Within Functional  Limits for tasks assessed                     General Comments       Exercises       Shoulder Instructions      Home Living Family/patient expects to be discharged to:: Private residence Living Arrangements: Alone Available Help at Discharge: Family;Friend(s);Available PRN/intermittently Type of Home: House Home Access: Stairs to enter [employer] of Steps: 2 Entrance Stairs-Rails: Right;Left Home Layout: One level     Bathroom Shower/Tub: Teacher, early years/pre: Standard     Home Equipment: Environmental consultant - 2 wheels;Cane - single point   Additional Comments: nephew checks on her 2-3 times per day 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 pt has a friend that prepares meals and brings them by to her      Prior Functioning/Environment Level of Independence: Independent with assistive device(s);Needs assistance  Gait / Transfers Assistance Needed: Uses RW for gait ADL's / Homemaking Assistance Needed: Nephew assists with transportation, household, and checks on patient.  Friends assist with housekeeping and meals.  Patient takes sponge baths.        OT Diagnosis: Generalized weakness;Acute pain   OT Problem List: Decreased strength;Decreased activity tolerance;Impaired balance (sitting and/or standing);Decreased safety awareness;Decreased knowledge of use of DME or AE;Decreased knowledge of precautions;Cardiopulmonary status limiting activity;Obesity;Pain;Impaired UE functional use   OT Treatment/Interventions: Self-care/ADL training;Therapeutic exercise;Energy conservation;DME and/or AE instruction;Therapeutic activities;Patient/family education;Balance training    OT Goals(Current goals can be found in the care plan section) Acute Rehab OT Goals Patient Stated Goal: to go home OT Goal Formulation: With patient Time For Goal Achievement: 06/10/15 Potential to Achieve Goals: Good  OT Frequency: Min 2X/week   Barriers to D/C: Decreased  caregiver support  lives alone with nephew that checks on her daily but throughout the day        Co-evaluation              End of Session Equipment Utilized During Treatment: Gait belt;Rolling walker Nurse Communication: Mobility status;Precautions  Activity Tolerance: Patient tolerated treatment well Patient left: in chair;with call bell/phone within reach;with chair alarm set   Time: [phone] OT Time Calculation (min): 16 min Charges:  OT General Charges $OT Visit: 1 Procedure OT Evaluation $OT Eval Moderate Complexity: 1 Procedure G-Codes:    Parke Poisson B 05/29/2015, 1:32 PM    Jeri Modena   OTR/L [phone] Office: [phone] .

## 2015-05-27 NOTE — Progress Notes (Signed)
05/27/2015 Central monitor called at 1000 am patient had a 1.78 pause at 0608 and a 1.35 at 1018. Patient was asymptomatic at 1018. Dr Broadus John was notified and made aware. [facility] RN.

## 2015-05-27 NOTE — Progress Notes (Addendum)
[facility] Progress Note                                            Patient Demographics:    [name], is a 82 y.o. female, DOB - 1922/10/13, [id number]  Admit date - 05/24/2015   Admitting Physician Ivor Costa, MD  Outpatient Primary MD for the patient is Default, Provider, MD  LOS - 3   Chief Complaint  Patient presents with  . Shortness of Breath  . Fatigue        Subjective:   No further bleeding, feels ok, R shoulder still hurts but better   Assessment  & Plan :   GIB (gastrointestinal bleeding): - likely triggered by supertherapeutic INR 3.34 2/2 Coumadin use.  -coumadin on hold, s/p 3 units PRBC so far -INR down to 1.1 -[facility] consulting, plan for EGD Tuesday -monitor CBC q12, hb 8 this am  Atrial Fibrillation: CHA2DS2-VASc Score is 5, needs oral anticoagulation. Patient is on Coumadin at home. INR was 3.34 on admission. Heart rate is well controlled. -Coumadin on hold -continue metoprolol with holding blood pressure parameter  HTN: -Hold lasix -continue metoprolol with holding blood pressure parameter, BP still soft  Hx of right sided breast cancer: s/p of lumpectomy and irradiation therapy 2002. Patient has been followed up by oncologist at [facility], last seen was on 04/03/15. She was told that her breast cancer is stable. -Follow-up with oncologist at [facility] diastolic congestive heart failure: Patient is on prn torsemide at home. 2-D echo 10/30/13 showed EF 60-65%. Patient has 1+ leg edema, which is at least partially caused by venous insufficiency.  -Hold diuretics due to soft blood pressure secondary to GI bleeding -resume lasix when BP more stable  Right shoulder pain and Left leg pain: - X-ray of R shoulder -Progressive severe degenerative arthropathy  -pain control: prn Norco and morphine  DVT ppx: SCD  Code Status: Full  code Family Communication: none at bedside Disposition Plan: Tx to tele  Consults  :  GI   Lab Results  Component Value Date   PLT 172 05/27/2015    Anti-infectives    None        Objective:   Filed Vitals:   05/26/15 2000 05/27/15 0100 05/27/15 0400 05/27/15 0759  BP: 94/49 98/52 102/55 95/47  Pulse: 71 65 71 70  Temp: 98.4 F (36.9 C) 98.3 F (36.8 C) 98.4 F (36.9 C) 98.1 F (36.7 C)  TempSrc: Oral Oral Oral Oral  Resp: 17 14 16 16   SpO2: 100% 98% 99% 98%    Wt Readings from Last 3 Encounters:  04/30/14 95.709 kg (211 lb)  10/23/13 93.35 kg (205 lb 12.8 oz)  07/20/12 91.082 kg (200 lb 12.8 oz)     Intake/Output Summary (Last 24 hours) at 05/27/15 1122 Last data filed at 05/27/15 0935  Gross per 24 hour  Intake    840 ml  Output    700 ml  Net    140 ml     Physical Exam  Awake Alert, Oriented X 3, No new F.N deficits, Normal affect [place].AT,PERRAL Supple Neck,No JVD, No cervical lymphadenopathy appriciated.  Symmetrical Chest wall movement, Good air movement bilaterally, CTAB RRR,No Gallops,Rubs or new Murmurs, No Parasternal Heave +ve B.Sounds, Abd Soft, No tenderness, No organomegaly appriciated, No rebound - guarding or rigidity. No Cyanosis, Clubbing or  edema, No new Rash or bruise      Data Review:    CBC  Recent Labs Lab 05/24/15 2050 05/25/15 0532 05/26/15 0026 05/26/15 0400 05/26/15 1119 05/26/15 1838 05/27/15 0317  WBC 12.0* 13.0*  --  10.6*  --  12.3* 9.8  HGB 6.5* 7.6* 7.9* 7.0* 9.6* 9.4* 8.0*  HCT 20.5* 23.1* 24.0* 21.6* 27.9* 29.3* 24.2*  PLT 216 156  --  159  --  190 172  MCV 92.8 88.5  --  90.0  --  89.9 89.3  MCH 29.4 29.1  --  29.2  --  28.8 29.5  MCHC 31.7 32.9  --  32.4  --  32.1 33.1  RDW 15.2 16.1*  --  17.8*  --  17.7* 18.1*    Chemistries   Recent Labs Lab 05/24/15 1714 05/25/15 0525 05/26/15 0400 05/27/15 0317  NA 145 147* 146* 142  K 4.4 4.2 3.8 3.8  CL 116* 119* 116* 112*  CO2 15* 22 24 23    GLUCOSE 133* 130* 102* 101*  BUN 95* 91* 55* 33*  CREATININE 0.95 0.75 0.86 0.78  CALCIUM 8.8* 8.7* 8.5* 8.4*  AST 26  --   --   --   ALT 16  --   --   --   ALKPHOS 51  --   --   --   BILITOT 0.4  --   --   --    ------------------------------------------------------------------------------------------------------------------  Recent Labs  05/25/15 0525  CHOL 89  HDL 21*  LDLCALC 29  TRIG 195*  CHOLHDL 4.2    Lab Results  Component Value Date   HGBA1C 5.9* 05/25/2015   ------------------------------------------------------------------------------------------------------------------ No results for input(s): TSH, T4TOTAL, T3FREE, THYROIDAB in the last 72 hours.  Invalid input(s): FREET3 ------------------------------------------------------------------------------------------------------------------ No results for input(s): VITAMINB12, FOLATE, FERRITIN, TIBC, IRON, RETICCTPCT in the last 72 hours.  Coagulation profile  Recent Labs Lab 05/23/15 1615 05/24/15 1758 05/25/15 1046 05/26/15 0400 05/27/15 0317  INR 3.0 3.34* 1.40 1.26 1.18    No results for input(s): DDIMER in the last 72 hours.  Cardiac Enzymes  Recent Labs Lab 05/24/15 2039 05/25/15 0525  TROPONINI 0.04* 0.03   ------------------------------------------------------------------------------------------------------------------ No results found for: BNP  Inpatient Medications  Scheduled Meds: . sodium chloride   Intravenous Once  . calcium-vitamin D  1 tablet Oral Daily  . cholecalciferol  1,000 Units Oral Daily  . metoprolol tartrate  25 mg Oral BID  . multivitamin with minerals  1 tablet Oral Daily  . omega-3 acid ethyl esters  1 g Oral Daily  . pantoprazole (PROTONIX) IV  40 mg Intravenous Q12H  . sodium chloride flush  3 mL Intravenous Q12H  . vitamin C  500 mg Oral Daily   Continuous Infusions:  PRN Meds:.HYDROcodone-acetaminophen, morphine injection, ondansetron **OR** ondansetron  (ZOFRAN) IV  Micro Results Recent Results (from the past 240 hour(s))  MRSA PCR Screening     Status: None   Collection Time: 05/24/15 10:06 PM  Result Value Ref Range Status   MRSA by PCR NEGATIVE NEGATIVE Final    Comment:        The GeneXpert MRSA Assay (FDA approved for NASAL specimens only), is one component of a comprehensive MRSA colonization surveillance program. It is not intended to diagnose MRSA infection nor to guide or monitor treatment for MRSA infections.     Radiology Reports Dg Chest 2 View  05/24/2015  CLINICAL DATA:  Generalized fatigue and weakness for several days. Shortness of breath. EXAM: CHEST  2 VIEW COMPARISON:  05/01/2012 FINDINGS: The cardiomediastinal silhouette is unchanged, with the cardiac silhouette remaining mildly enlarged. The lungs are well inflated. Vascular congestion and interstitial densities on the prior study have resolved. There is no evidence of acute airspace consolidation, edema, pleural effusion, or pneumothorax. Advanced degenerative changes are present about both shoulders, and right axillary surgical clips are noted. IMPRESSION: No active cardiopulmonary disease. Electronically Signed   By: Logan Bores M.D.   On: 05/24/2015 17:48   Dg Shoulder Right  05/25/2015  CLINICAL DATA:  General high shoulder pain. Pain with moving question RIGHT shoulder for 9 months. EXAM: RIGHT SHOULDER - 2+ VIEW COMPARISON:  Shoulder Radiograph 04/11/2007, chest radiograph 05/01/2012 FINDINGS: No acute fracture dislocation of the RIGHT shoulder. There is severe degenerative change of the glenohumeral joint with erosion of the humeral head and fragmentation of inferior aspect of the glenoid fossa. There is heterotrophic ossification. Findings are similar to but progressed from chest radiograph of 05/01/2012 and shoulder film of 04/11/2007. IMPRESSION: Progressive severe degenerative arthropathy of the RIGHT shoulder joint. Electronically Signed   By: Suzy Bouchard M.D.   On: 05/25/2015 09:25   Dg Tibia/fibula Left  05/25/2015  CLINICAL DATA:  82 year old female with a history of calf pain for 4 years. EXAM: LEFT TIBIA AND FIBULA - 2 VIEW COMPARISON:  05/02/2012 FINDINGS: Diffuse osteopenia. Surgical changes of distal fibular open reduction internal fixation. Posttraumatic changes of the proximal fibula, with healed fracture site. Advanced degenerative changes of the knee joint. Degenerative changes of the ankle joint. No displaced fracture identified. IMPRESSION: Negative for acute bony abnormality. Osteopenia. Posttraumatic changes of the proximal fibula with surgical changes of open reduction internal fixation of distal fibula. Advanced degenerative changes of the knee and ankle. Signed, Dulcy Fanny. Earleen Newport, DO [facility] Radiology Electronically Signed   By: Corrie Mckusick D.O.   On: 05/25/2015 09:24    Time Spent in minutes   33min   Avriana Joo M.D on 05/27/2015 at 11:22 AM  Between 7am to 7pm - Pager - [phone]  After 7pm go to [email or web address] - password [facility]  [facility] -  Office  [phone]

## 2015-05-27 NOTE — Progress Notes (Signed)
Patient ID: [name], female   DOB: [birth year], 82 y.o.   MRN: [id number] [facility] Gastroenterology Progress Note  [name] 82 y.o. [birth year]   Subjective: Family in room. Denies abdominal pain. Denies BMs overnight. Had some leg pain yesterday. Family reports that her father and brother both had abdominal aneurysms.  Objective: Vital signs in last 24 hours: Filed Vitals:   05/27/15 0400 05/27/15 0759  BP: 102/55 95/47  Pulse: 71 70  Temp: 98.4 F (36.9 C) 98.1 F (36.7 C)  Resp: 16 16    Physical Exam: Gen: alert, no acute distress, elderly HEENT: anicteric CV: RRR Chest: CTA B Abd: soft, nontender, nondistended  Lab Results:  Recent Labs  05/26/15 0400 05/27/15 0317  NA 146* 142  K 3.8 3.8  CL 116* 112*  CO2 24 23  GLUCOSE 102* 101*  BUN 55* 33*  CREATININE 0.86 0.78  CALCIUM 8.5* 8.4*    Recent Labs  05/24/15 1714  AST 26  ALT 16  ALKPHOS 51  BILITOT 0.4  PROT 5.0*  ALBUMIN 2.6*    Recent Labs  05/26/15 1838 05/27/15 0317  WBC 12.3* 9.8  HGB 9.4* 8.0*  HCT 29.3* 24.2*  MCV 89.9 89.3  PLT 190 172    Recent Labs  05/26/15 0400 05/27/15 0317  LABPROT 16.0* 15.2  INR 1.26 1.18      Assessment/Plan: Melena with worsening anemia in the setting of chronic Coumadin (on hold). Will plan to do an EGD with Propofol sedation and earliest available time is 05/29/15. Continue Protonix 40 mg IV Q 12 hours. Soft food today. Clear liquids tomorrow. NPO p MN Tuesday night. D/W family.   Silvana C. 05/27/2015, 10:14 AM  Pager [phone]  If no answer or after 5 PM call [phone]

## 2015-05-28 ENCOUNTER — Encounter (HOSPITAL_COMMUNITY): Payer: Self-pay | Admitting: Anesthesiology

## 2015-05-28 ENCOUNTER — Encounter (HOSPITAL_COMMUNITY)
Admission: EM | Disposition: A | Discharge status: Skilled Nursing Facility | Payer: Self-pay | Source: Home / Self Care | Attending: Internal Medicine

## 2015-05-28 ENCOUNTER — Ambulatory Visit (HOSPITAL_COMMUNITY): Admit: 2015-05-28 | Payer: Self-pay | Admitting: Gastroenterology

## 2015-05-28 ENCOUNTER — Inpatient Hospital Stay (HOSPITAL_COMMUNITY): Payer: Medicare Other | Admitting: Anesthesiology

## 2015-05-28 HISTORY — PX: ESOPHAGOGASTRODUODENOSCOPY (EGD) WITH PROPOFOL: SHX5813

## 2015-05-28 LAB — BASIC METABOLIC PANEL
Anion gap: 4 — ABNORMAL LOW (ref 5–15)
Anion gap: 6 (ref 5–15)
BUN: 24 mg/dL — ABNORMAL HIGH (ref 6–20)
BUN: 20 mg/dL (ref 6–20)
CO2: 24 mmol/L (ref 22–32)
CO2: 25 mmol/L (ref 22–32)
Calcium: 7.9 mg/dL — ABNORMAL LOW (ref 8.9–10.3)
Calcium: 8.3 mg/dL — ABNORMAL LOW (ref 8.9–10.3)
Chloride: 111 mmol/L (ref 101–111)
Chloride: 110 mmol/L (ref 101–111)
Creatinine, Ser: 0.91 mg/dL (ref 0.44–1.00)
Creatinine, Ser: 0.83 mg/dL (ref 0.44–1.00)
GFR calc Af Amer: >60 mL/min (ref >60)
GFR calc Af Amer: >60 mL/min (ref >60)
GFR calc non Af Amer: 54 mL/min — ABNORMAL LOW (ref >60)
GFR calc non Af Amer: >60 mL/min (ref >60)
Glucose, Bld: 98 mg/dL (ref 65–99)
Glucose, Bld: 94 mg/dL (ref 65–99)
Potassium: 4 mmol/L (ref 3.5–5.1)
Potassium: 3.9 mmol/L (ref 3.5–5.1)
Sodium: 139 mmol/L (ref 135–145)
Sodium: 141 mmol/L (ref 135–145)

## 2015-05-28 LAB — CBC
HCT: 22.6 % — ABNORMAL LOW (ref 36.0–46.0)
HCT: 27.3 % — ABNORMAL LOW (ref 36.0–46.0)
Hemoglobin: 7.4 g/dL — ABNORMAL LOW (ref 12.0–15.0)
Hemoglobin: 9.1 g/dL — ABNORMAL LOW (ref 12.0–15.0)
MCH: 30 pg (ref 26.0–34.0)
MCH: 30.3 pg (ref 26.0–34.0)
MCHC: 32.7 g/dL (ref 30.0–36.0)
MCHC: 33.3 g/dL (ref 30.0–36.0)
MCV: 91.5 fL (ref 78.0–100.0)
MCV: 91 fL (ref 78.0–100.0)
Platelets: 172 10*3/uL (ref 150–400)
Platelets: 184 10*3/uL (ref 150–400)
RBC: 2.47 MIL/uL — ABNORMAL LOW (ref 3.87–5.11)
RBC: 3 MIL/uL — ABNORMAL LOW (ref 3.87–5.11)
RDW: 18.5 % — ABNORMAL HIGH (ref 11.5–15.5)
RDW: 17.6 % — ABNORMAL HIGH (ref 11.5–15.5)
WBC: 9.2 10*3/uL (ref 4.0–10.5)
WBC: 9.9 10*3/uL (ref 4.0–10.5)

## 2015-05-28 LAB — GLUCOSE, CAPILLARY: Glucose-Capillary: 90 mg/dL (ref 65–99)

## 2015-05-28 LAB — PREPARE RBC (CROSSMATCH)

## 2015-05-28 SURGERY — ESOPHAGOGASTRODUODENOSCOPY (EGD) WITH PROPOFOL
Anesthesia: Monitor Anesthesia Care | Laterality: Left

## 2015-05-28 MED ORDER — PHENYLEPHRINE HCL 10 MG/ML IJ SOLN
INTRAMUSCULAR | Status: DC | PRN
  Administered 2015-05-28: 40 ug via INTRAVENOUS

## 2015-05-28 MED ORDER — FUROSEMIDE 10 MG/ML IJ SOLN
20.0000 mg | Freq: Once | INTRAMUSCULAR | Status: AC
  Administered 2015-05-28: 20 mg via INTRAVENOUS
  Filled 2015-05-28: qty 2

## 2015-05-28 MED ORDER — LIDOCAINE HCL (CARDIAC) 20 MG/ML IV SOLN
INTRAVENOUS | Status: DC | PRN
  Administered 2015-05-28: 30 mg via INTRATRACHEAL

## 2015-05-28 MED ORDER — SODIUM CHLORIDE 0.9 % IV SOLN: Freq: Once | INTRAVENOUS | Status: DC

## 2015-05-28 MED ORDER — LACTATED RINGERS IV SOLN
INTRAVENOUS | Status: DC | PRN
  Administered 2015-05-28: 13:00:00 via INTRAVENOUS

## 2015-05-28 MED ORDER — PROPOFOL 10 MG/ML IV BOLUS
INTRAVENOUS | Status: DC | PRN
  Administered 2015-05-28: 30 mg via INTRAVENOUS
  Administered 2015-05-28 (×2): 20 mg via INTRAVENOUS

## 2015-05-28 NOTE — Anesthesia Procedure Notes (Signed)
Procedure Name: MAC Date/Time: 05/28/2015 1:00 PM Performed by: Neldon Newport Pre-anesthesia Checklist: Suction available, Patient being monitored, Emergency Drugs available, Timeout performed and Patient identified Oxygen Delivery Method: Nasal cannula Placement Confirmation: positive ETCO2

## 2015-05-28 NOTE — Op Note (Signed)
[facility] Patient Name: [name] Procedure Date : 05/28/2015 MRN: [id number] Attending MD: Lear Ng , MD Date of Birth: 10-22-1922 CSN: [id number] Age: 82 Admit Type: Inpatient Procedure:                Upper GI endoscopy Indications:              Iron deficiency anemia, Heme positive stool Providers:                Lear Ng, MD, Malka So, RN,                            William Dalton, Technician Referring MD:              Medicines:                Propofol per Anesthesia, Monitored Anesthesia Care Complications:            No immediate complications. Estimated Blood Loss:     Estimated blood loss: none. Procedure:                Pre-Anesthesia Assessment:                           - Prior to the procedure, a History and Physical                            was performed, and patient medications and                            allergies were reviewed. The patient's tolerance of                            previous anesthesia was also reviewed. The risks                            and benefits of the procedure and the sedation                            options and risks were discussed with the patient.                            All questions were answered, and informed consent                            was obtained. Prior Anticoagulants: The patient has                            taken no previous anticoagulant or antiplatelet                            agents. ASA Grade Assessment: III - A patient with                            severe systemic disease. After reviewing the risks  and benefits, the patient was deemed in                            satisfactory condition to undergo the procedure.                           After obtaining informed consent, the endoscope was                            passed under direct vision. Throughout the                            procedure, the patient's blood pressure,  pulse, and                            oxygen saturations were monitored continuously. The                            [id number]) scope was introduced through the                            mouth, and advanced to the second part of duodenum.                            The upper GI endoscopy was accomplished without                            difficulty. The patient tolerated the procedure                            well. Scope In: Scope Out: Findings:      The oropharynx was normal.      The examined esophagus was normal.      One non-bleeding superficial gastric ulcer with no stigmata of bleeding       was found in the gastric antrum. The lesion was 5 mm in largest       dimension.      One non-bleeding linear gastric ulcer with no stigmata of bleeding was       found in the gastric antrum. The lesion was 4 mm in largest dimension.      Localized moderate mucosal changes characterized by congestion and       erythema were found in the prepyloric region of the stomach.      The examined duodenum was normal. Impression:               - Normal oropharynx.                           - Normal esophagus.                           - Non-bleeding gastric ulcer with no stigmata of                            bleeding.                           -  Non-bleeding gastric ulcer with no stigmata of                            bleeding.                           - Congested and erythematous mucosa in the                            prepyloric region of the stomach.                           - Normal examined duodenum.                           - No specimens collected. Moderate Sedation:      MAC Recommendation:           - Clear liquid diet.                           - Use Protonix (pantoprazole) 40 mg IV twice daily.                           - Post procedure medication orders were given. Procedure Code(s):        --- Professional ---                           [phone],  Esophagogastroduodenoscopy, flexible,                            transoral; diagnostic, including collection of                            specimen(s) by brushing or washing, when performed                            (separate procedure) Diagnosis Code(s):        --- Professional ---                           D50.9, Iron deficiency anemia, unspecified                           K25.9, Gastric ulcer, unspecified as acute or                            chronic, without hemorrhage or perforation                           R19.5, Other fecal abnormalities                           K31.89, Other diseases of stomach and duodenum CPT copyright 2016 American [employer] Association. All rights reserved. The codes documented in this report are preliminary and upon coder review may  be revised to meet current compliance requirements. Wilford Corner, MD Lear Ng, MD 05/28/2015 1:38:17 PM This  report has been signed electronically. Number of Addenda: 0

## 2015-05-28 NOTE — Clinical Social Work Note (Signed)
CSW attempted to visit patient regarding discharge plans and assessment. Patient was not in the room due to having a procedure. CSW will follow, and attempt to complete assessment tomorrow.

## 2015-05-28 NOTE — Interval H&P Note (Signed)
History and Physical Interval Note:  05/28/2015 12:49 PM  [name]  has presented today for surgery, with the diagnosis of anemia, heme + stool  The various methods of treatment have been discussed with the patient and family. After consideration of risks, benefits and other options for treatment, the patient has consented to  Procedure(s): ESOPHAGOGASTRODUODENOSCOPY (EGD) WITH PROPOFOL (Left) as a surgical intervention .  The patient's history has been reviewed, patient examined, no change in status, stable for surgery.  I have reviewed the patient's chart and labs.  Questions were answered to the patient's satisfaction.     [place] C.

## 2015-05-28 NOTE — Brief Op Note (Signed)
Clean-based antral ulcers. Likely NSAID-related. Check H. Pylori serology and treat if positive. Ok to resume anticoagulation today if deemed necessary. Continue IV PPI Q 12 hours until on solid food and then change to PO PPI BID.

## 2015-05-28 NOTE — H&P (View-Only) (Signed)
Patient ID: [name], female   DOB: [birth year], 82 y.o.   MRN: [id number] [facility] Gastroenterology Progress Note  [name] 82 y.o. [birth year]   Subjective: Family in room. Denies abdominal pain. Denies BMs overnight. Had some leg pain yesterday. Family reports that her father and brother both had abdominal aneurysms.  Objective: Vital signs in last 24 hours: Filed Vitals:   05/27/15 0400 05/27/15 0759  BP: 102/55 95/47  Pulse: 71 70  Temp: 98.4 F (36.9 C) 98.1 F (36.7 C)  Resp: 16 16    Physical Exam: Gen: alert, no acute distress, elderly HEENT: anicteric CV: RRR Chest: CTA B Abd: soft, nontender, nondistended  Lab Results:  Recent Labs  05/26/15 0400 05/27/15 0317  NA 146* 142  K 3.8 3.8  CL 116* 112*  CO2 24 23  GLUCOSE 102* 101*  BUN 55* 33*  CREATININE 0.86 0.78  CALCIUM 8.5* 8.4*    Recent Labs  05/24/15 1714  AST 26  ALT 16  ALKPHOS 51  BILITOT 0.4  PROT 5.0*  ALBUMIN 2.6*    Recent Labs  05/26/15 1838 05/27/15 0317  WBC 12.3* 9.8  HGB 9.4* 8.0*  HCT 29.3* 24.2*  MCV 89.9 89.3  PLT 190 172    Recent Labs  05/26/15 0400 05/27/15 0317  LABPROT 16.0* 15.2  INR 1.26 1.18      Assessment/Plan: Melena with worsening anemia in the setting of chronic Coumadin (on hold). Will plan to do an EGD with Propofol sedation and earliest available time is 05/29/15. Continue Protonix 40 mg IV Q 12 hours. Soft food today. Clear liquids tomorrow. NPO p MN Tuesday night. D/W family.   [place] C. 05/27/2015, 10:14 AM  Pager [phone]  If no answer or after 5 PM call [phone]

## 2015-05-28 NOTE — Anesthesia Preprocedure Evaluation (Addendum)
Anesthesia Evaluation  Patient identified by MRN, date of birth, ID band Patient awake    Reviewed: Allergy & Precautions, NPO status , Patient's Chart, lab work & pertinent test results  History of Anesthesia Complications Negative for: history of anesthetic complications  Airway Mallampati: II  TM Distance: >3 FB Neck ROM: Full    Dental  (+) Dental Advisory Given, Teeth Intact   Pulmonary neg pulmonary ROS,    breath sounds clear to auscultation       Cardiovascular hypertension, Pt. on medications and Pt. on home beta blockers (-) angina+ dysrhythmias Atrial Fibrillation  Rhythm:Irregular Rate:Normal  '15 ECHO:  EF 65%, valves OK   Neuro/Psych negative neurological ROS     GI/Hepatic negative GI ROS, Neg liver ROS, neg GERD  ,  Endo/Other  negative endocrine ROS  Renal/GU negative Renal ROS     Musculoskeletal  (+) Arthritis ,   Abdominal   Peds  Hematology  (+) Blood dyscrasia (Hb 7.4,  off Coumadin since Thursday, INR 1.18), ,   Anesthesia Other Findings   Reproductive/Obstetrics                            Anesthesia Physical Anesthesia Plan  ASA: III  Anesthesia Plan: MAC   Post-op Pain Management:    Induction: Intravenous  Airway Management Planned: Natural Airway and Nasal Cannula  Additional Equipment:   Intra-op Plan:   Post-operative Plan:   Informed Consent: I have reviewed the patients History and Physical, chart, labs and discussed the procedure including the risks, benefits and alternatives for the proposed anesthesia with the patient or authorized representative who has indicated his/her understanding and acceptance.   Dental advisory given  Plan Discussed with: Surgeon and CRNA  Anesthesia Plan Comments: (Plan routine monitors, MAC)        Anesthesia Quick Evaluation

## 2015-05-28 NOTE — Anesthesia Postprocedure Evaluation (Signed)
Anesthesia Post Note  Patient: [name]  Procedure(s) Performed: Procedure(s) (LRB): ESOPHAGOGASTRODUODENOSCOPY (EGD) WITH PROPOFOL (Left)  Patient location during evaluation: Endoscopy Anesthesia Type: MAC Level of consciousness: awake and alert, oriented and patient cooperative Pain management: pain level controlled Vital Signs Assessment: post-procedure vital signs reviewed and stable Respiratory status: spontaneous breathing, nonlabored ventilation and respiratory function stable Cardiovascular status: blood pressure returned to baseline and stable Postop Assessment: no signs of nausea or vomiting Anesthetic complications: no    Last Vitals:  Filed Vitals:   05/28/15 1224 05/28/15 1325  BP: 136/50 100/51  Pulse: 68 69  Temp: 36.8 C   Resp: 19 19    Last Pain:  Filed Vitals:   05/28/15 1325  PainSc: 6                  Ifeanyi Mickelson,E. Jamier Urbas

## 2015-05-28 NOTE — Progress Notes (Addendum)
T [facility] Progress Note                                            Patient Demographics:    [name], is a 82 y.o. female, DOB - 03/09/1922, [id number]  Admit date - 05/24/2015   Admitting Physician Ivor Costa, MD  Narrative: [name] is a 82 y.o. female with PMH of atrial fibrillation on Coumadin, hypertension, diastolic congestive heart failure, who presented with GI bleeding and Anemia/generalized weakness. Noted to have dark bloody stool on rectal exam/hemoccult positive, hb 7.4 on admission, last baseline was 13 in 2014 Required 3units PRBC so far [facility] consulting, plan for EGD today   Subjective:  Feels well, no further dark stools   Assessment  & Plan :   GIB (gastrointestinal bleeding): - likely triggered by supertherapeutic INR 3.34 2/2 Coumadin use.  -coumadin on hold, s/p 3 units PRBC so far, INR down to 1.1 -[facility] consulting, plan for EGD today -hb down to 7.4 will give another unit today  Acute Blood loss anemia  -from #1, give another unit of blood today, monitor Hb  Atrial Fibrillation: CHA2DS2-VASc Score is 5, needs oral anticoagulation. Patient is on Coumadin at home. INR was 3.34 on admission. Heart rate is well controlled. -Coumadin on hold -continue metoprolol, cut down dose due to sinus pauses 1.64msec and 1.40msec  HTN: -Hold lasix -continue metoprolol at lower dose, BP still soft  Hx of right sided breast cancer: s/p of lumpectomy and irradiation therapy 2002. Patient has been followed up by oncologist at [facility], last seen was on 04/03/15. She was told that her breast cancer is stable. -Follow-up with oncologist at [place] diastolic congestive heart failure: Patient is on prn torsemide at home. 2-D echo 10/30/13 showed EF 60-65%. Patient has trace edema, which is at least partially caused by venous insufficiency.    -Holding diuretics due to soft blood pressure secondary to GI bleeding -resume lasix when BP more stable  Right shoulder pain and Left leg pain: - X-ray of R shoulder -Progressive severe degenerative arthropathy  -pain control: prn Norco and morphine  DVT ppx: SCD  Code Status: Full code Family Communication: none at bedside, called and d/w friend Ladoris Gene per Pts request Disposition Plan: needs SNF, CSW consulted  Consults  :  GI   Lab Results  Component Value Date   PLT 172 05/28/2015    Anti-infectives    None        Objective:   Filed Vitals:   05/27/15 1700 05/27/15 2045 05/28/15 0518 05/28/15 0848  BP: 115/50 102/52 107/50 107/43  Pulse: 61 69 69 73  Temp: 97.7 F (36.5 C) 98.3 F (36.8 C) 98 F (36.7 C) 98.2 F (36.8 C)  TempSrc: Oral Oral Oral Oral  Resp: 18 16 18    Weight:  89.404 kg (197 lb 1.6 oz)    SpO2: 100% 100% 97% 99%    Wt Readings from Last 3 Encounters:  05/27/15 89.404 kg (197 lb 1.6 oz)  04/30/14 95.709 kg (211 lb)  10/23/13 93.35 kg (205 lb 12.8 oz)     Intake/Output Summary (Last 24 hours) at 05/28/15 1025 Last data filed at 05/28/15 0730  Gross per 24 hour  Intake    720 ml  Output    600 ml  Net    120 ml  Physical Exam  Awake Alert, Oriented X 3, No new F.N deficits, Normal affect [place].AT,PERRAL Supple Neck,No JVD, No cervical lymphadenopathy appriciated.  Symmetrical Chest wall movement, Good air movement bilaterally, CTAB RRR,No Gallops,Rubs or new Murmurs, No Parasternal Heave +ve B.Sounds, Abd Soft, No tenderness, No organomegaly appriciated, No rebound - guarding or rigidity. No Cyanosis, Clubbing or edema, No new Rash or bruise      Data Review:    CBC  Recent Labs Lab 05/26/15 0400 05/26/15 1119 05/26/15 1838 05/27/15 0317 05/27/15 1645 05/28/15 0501  WBC 10.6*  --  12.3* 9.8 11.3* 9.2  HGB 7.0* 9.6* 9.4* 8.0* 8.5* 7.4*  HCT 21.6* 27.9* 29.3* 24.2* 26.6* 22.6*  PLT 159  --  190 172 209 172   MCV 90.0  --  89.9 89.3 91.1 91.5  MCH 29.2  --  28.8 29.5 29.1 30.0  MCHC 32.4  --  32.1 33.1 32.0 32.7  RDW 17.8*  --  17.7* 18.1* 18.5* 18.5*    Chemistries   Recent Labs Lab 05/24/15 1714 05/25/15 0525 05/26/15 0400 05/27/15 0317 05/28/15 0501  NA 145 147* 146* 142 139  K 4.4 4.2 3.8 3.8 4.0  CL 116* 119* 116* 112* 111  CO2 15* 22 24 23 24   GLUCOSE 133* 130* 102* 101* 98  BUN 95* 91* 55* 33* 24*  CREATININE 0.95 0.75 0.86 0.78 0.91  CALCIUM 8.8* 8.7* 8.5* 8.4* 7.9*  AST 26  --   --   --   --   ALT 16  --   --   --   --   ALKPHOS 51  --   --   --   --   BILITOT 0.4  --   --   --   --    ------------------------------------------------------------------------------------------------------------------ No results for input(s): CHOL, HDL, LDLCALC, TRIG, CHOLHDL, LDLDIRECT in the last 72 hours.  Lab Results  Component Value Date   HGBA1C 5.9* 05/25/2015   ------------------------------------------------------------------------------------------------------------------ No results for input(s): TSH, T4TOTAL, T3FREE, THYROIDAB in the last 72 hours.  Invalid input(s): FREET3 ------------------------------------------------------------------------------------------------------------------ No results for input(s): VITAMINB12, FOLATE, FERRITIN, TIBC, IRON, RETICCTPCT in the last 72 hours.  Coagulation profile  Recent Labs Lab 05/23/15 1615 05/24/15 1758 05/25/15 1046 05/26/15 0400 05/27/15 0317  INR 3.0 3.34* 1.40 1.26 1.18    No results for input(s): DDIMER in the last 72 hours.  Cardiac Enzymes  Recent Labs Lab 05/24/15 2039 05/25/15 0525  TROPONINI 0.04* 0.03   ------------------------------------------------------------------------------------------------------------------ No results found for: BNP  Inpatient Medications  Scheduled Meds: . sodium chloride   Intravenous Once  . sodium chloride   Intravenous Once  . calcium-vitamin D  1 tablet Oral  Daily  . cholecalciferol  1,000 Units Oral Daily  . metoprolol tartrate  12.5 mg Oral BID  . multivitamin with minerals  1 tablet Oral Daily  . omega-3 acid ethyl esters  1 g Oral Daily  . pantoprazole (PROTONIX) IV  40 mg Intravenous Q12H  . sodium chloride flush  3 mL Intravenous Q12H  . vitamin C  500 mg Oral Daily   Continuous Infusions:  PRN Meds:.HYDROcodone-acetaminophen, morphine injection, ondansetron **OR** ondansetron (ZOFRAN) IV  Micro Results Recent Results (from the past 240 hour(s))  MRSA PCR Screening     Status: None   Collection Time: 05/24/15 10:06 PM  Result Value Ref Range Status   MRSA by PCR NEGATIVE NEGATIVE Final    Comment:        The GeneXpert MRSA Assay (FDA approved  for NASAL specimens only), is one component of a comprehensive MRSA colonization surveillance program. It is not intended to diagnose MRSA infection nor to guide or monitor treatment for MRSA infections.     Radiology Reports Dg Chest 2 View  05/24/2015  CLINICAL DATA:  Generalized fatigue and weakness for several days. Shortness of breath. EXAM: CHEST  2 VIEW COMPARISON:  05/01/2012 FINDINGS: The cardiomediastinal silhouette is unchanged, with the cardiac silhouette remaining mildly enlarged. The lungs are well inflated. Vascular congestion and interstitial densities on the prior study have resolved. There is no evidence of acute airspace consolidation, edema, pleural effusion, or pneumothorax. Advanced degenerative changes are present about both shoulders, and right axillary surgical clips are noted. IMPRESSION: No active cardiopulmonary disease. Electronically Signed   By: Logan Bores M.D.   On: 05/24/2015 17:48   Dg Shoulder Right  05/25/2015  CLINICAL DATA:  General high shoulder pain. Pain with moving question RIGHT shoulder for 9 months. EXAM: RIGHT SHOULDER - 2+ VIEW COMPARISON:  Shoulder Radiograph 04/11/2007, chest radiograph 05/01/2012 FINDINGS: No acute fracture dislocation of  the RIGHT shoulder. There is severe degenerative change of the glenohumeral joint with erosion of the humeral head and fragmentation of inferior aspect of the glenoid fossa. There is heterotrophic ossification. Findings are similar to but progressed from chest radiograph of 05/01/2012 and shoulder film of 04/11/2007. IMPRESSION: Progressive severe degenerative arthropathy of the RIGHT shoulder joint. Electronically Signed   By: Suzy Bouchard M.D.   On: 05/25/2015 09:25   Dg Tibia/fibula Left  05/25/2015  CLINICAL DATA:  82 year old female with a history of calf pain for 4 years. EXAM: LEFT TIBIA AND FIBULA - 2 VIEW COMPARISON:  05/02/2012 FINDINGS: Diffuse osteopenia. Surgical changes of distal fibular open reduction internal fixation. Posttraumatic changes of the proximal fibula, with healed fracture site. Advanced degenerative changes of the knee joint. Degenerative changes of the ankle joint. No displaced fracture identified. IMPRESSION: Negative for acute bony abnormality. Osteopenia. Posttraumatic changes of the proximal fibula with surgical changes of open reduction internal fixation of distal fibula. Advanced degenerative changes of the knee and ankle. Signed, Dulcy Fanny. Earleen Newport, DO [facility] Radiology Electronically Signed   By: Corrie Mckusick D.O.   On: 05/25/2015 09:24    Time Spent in minutes   63min   Eilleen Davoli M.D on 05/28/2015 at 10:25 AM  Between 7am to 7pm - Pager - [phone]  After 7pm go to [email or web address] - password [facility]  [facility] -  Office  [phone]

## 2015-05-28 NOTE — Transfer of Care (Signed)
Immediate Anesthesia Transfer of Care Note  Patient: [name]  Procedure(s) Performed: Procedure(s): ESOPHAGOGASTRODUODENOSCOPY (EGD) WITH PROPOFOL (Left)  Patient Location: Endoscopy Unit  Anesthesia Type:MAC  Level of Consciousness: awake, alert  and oriented  Airway & Oxygen Therapy: Patient Spontanous Breathing and Patient connected to nasal cannula oxygen  Post-op Assessment: Report given to RN, Post -op Vital signs reviewed and stable and Patient moving all extremities X 4  Post vital signs: Reviewed and stable  Last Vitals:  Filed Vitals:   05/28/15 0848 05/28/15 1224  BP: 107/43 136/50  Pulse: 73 68  Temp: 36.8 C 36.8 C  Resp:  19    Complications: No apparent anesthesia complications

## 2015-05-29 DIAGNOSIS — R7989 Other specified abnormal findings of blood chemistry: Secondary | ICD-10-CM

## 2015-05-29 DIAGNOSIS — I5032 Chronic diastolic (congestive) heart failure: Secondary | ICD-10-CM

## 2015-05-29 DIAGNOSIS — K922 Gastrointestinal hemorrhage, unspecified: Secondary | ICD-10-CM

## 2015-05-29 LAB — CBC
HCT: 27.1 % — ABNORMAL LOW (ref 36.0–46.0)
Hemoglobin: 8.7 g/dL — ABNORMAL LOW (ref 12.0–15.0)
MCH: 29.4 pg (ref 26.0–34.0)
MCHC: 32.1 g/dL (ref 30.0–36.0)
MCV: 91.6 fL (ref 78.0–100.0)
Platelets: 195 10*3/uL (ref 150–400)
RBC: 2.96 MIL/uL — ABNORMAL LOW (ref 3.87–5.11)
RDW: 17.4 % — ABNORMAL HIGH (ref 11.5–15.5)
WBC: 8.7 10*3/uL (ref 4.0–10.5)

## 2015-05-29 LAB — TYPE AND SCREEN
ABO/RH(D): AB POS
Antibody Screen: NEGATIVE
Unit division: 0

## 2015-05-29 NOTE — Clinical Social Work Note (Signed)
Clinical Social Work Assessment  Patient Details  Name: [name] MRN: [id number] Date of Birth: 30-Dec-1922  Date of referral:  05/28/15               Reason for consult:  Facility Placement                Permission sought to share information with:  Family Supports, Customer service manager Permission granted to share information::  Yes, Verbal Permission Granted  Name::     Max Pandya and Jan [place]::     Relationship::  Brother and friend  Sport and exercise psychologist Information:  Max - [phone]  Housing/Transportation Living arrangements for the past 2 months:  [place] (Patient lives in brothers home) Source of Information:  Patient Patient Interpreter Needed:  None Criminal Activity/Legal Involvement Pertinent to Current Situation/Hospitalization:  No - Comment as needed Significant Relationships:  Other Family Members, Friend Lives with:  Siblings (Brother Max Engineer, materials) Do you feel safe going back to the place where you live?  Yes Need for family participation in patient care:  Yes (Comment) (Patient called friend Jan as she wanted CSW to talk with her about SNF placement)  Care giving concerns:  Patient wants to discharge home and having needed assistance and supervisor post discharge discussed.  Ms. Lingley reported that her brother is a retired Forensic psychologist and is in and out of the home. Patient reported that her nephew checks in on her 2-3 times per day and she has a couple of friends that check on her regularly and could be with her during the day while her brother works. Ms. Deraad reported that she has a lift chair, 2 walkers (one with a seat), and toilet seat. Patient added that she takes sponge bathes as she cannot get into the bathtub.    Social Worker assessment / plan:  CSW talked with patient regarding discharge planning and recommendation of ST rehab. Ms. Machowski was sitting up in the chair at bedside and is alert, oriented, pleasant and  receptive to talking with CSW.  Patient has been to [place] rehab at [facility] 2014 and indicated that the rehab there was very good.  Ms. Lucero feels that rehab will not really help her because she has had both hips replaced and one knee replaced and that rehab will not improve her mobility due to these replacements. However during the conversation, patient appears to be open to going to rehab, especially if her friend feels she should. CSW informed that Jan is coming to hospital today and patient will request to see CSW once she gets here.  Employment status:  Retired Forensic scientist:  Glass blower/designer) PT Recommendations:  [place] / Referral to community resources:  [place] (Patient has a facility preference if she goes to rehab - [facility])  Patient/Family's Response to care:  No concerns expressed by patient regarding care during hospitalization.  Patient/Family's Understanding of and Emotional Response to Diagnosis, Current Treatment, and Prognosis:  Not discussed.  Emotional Assessment Appearance:  Appears younger than stated age Attitude/Demeanor/Rapport:  Other (Appropriate, open) Affect (typically observed):  Accepting, Pleasant, Appropriate Orientation:  Oriented to Self, Oriented to Place, Oriented to  Time, Oriented to Situation Alcohol / Substance use:  Never Used Psych involvement (Current and /or in the community):  No (Comment)  Discharge Needs  Concerns to be addressed:  Discharge Planning Concerns Readmission within the last 30 days:  No Current discharge risk:  None Barriers to Discharge:  No Barriers Identified   Sable Feil, LCSW 05/29/2015, 2:12 PM

## 2015-05-29 NOTE — Progress Notes (Addendum)
PATIENT DETAILS Name: [name] Age: 82 y.o. Sex: female Date of Birth: 03/11/1922 Admit Date: 05/24/2015 Admitting Physician Ivor Costa, MD [id number], Provider, MD  Subjective: Feels much better. No further melena.  Assessment/Plan: Principal Problem:  Upper GIB (gastrointestinal bleeding): Seems to have resolved, GI consulted and underwent EGD on 3/28 showed clean-based antral ulcers likely secondary to NSAIDs. Recommendations are to continue with PPI twice a day for 3 months, and then daily. H. pylori serology still pending. Per GI, okay to resume anticoagulation.  Active Problems: Acute blood loss anemia: Transfused numerous units of PRBC during this hospital stay. Hemoglobin currently stable. Follow.  Atrial fibrillation: Rate controlled with metoprolol. CHA2DS2-VASc Score of around 5. Previously on anticoagulation. Spoke with gastroenterology-GI M.D., ulcers are not bleeding-and felt to be from NSAIDs-per GI, okay to resume anticoagulation. Subsequently spoke with patient, explained risks of bleeding with resuming anticoagulation, and the risk of CVA of anticoagulation. At this time we have elected to resume anticoagulation cautiously-will start 3/30 and at lower dose.  Intermittent sinus pauses: Pauses upto 2 seconds-will stop Metoprolol today  Chronic diastolic heart failure: Clinically compensated. Resume diuretics in the next few days.  Right shoulder/left leg pain: Chronic issues. Stable for continued outpatient monitoring/treatment.  History of right-sided breast cancer:s/p of lumpectomy and irradiation therapy 2002. Patient has been followed up by oncologist at [facility], last seen was on 04/03/15. Plan is to continue outpatient follow-up with oncologist at [facility].  Disposition: Remain inpatient-SNF 3/20  Antimicrobial agents  See below  Anti-infectives    None      DVT Prophylaxis: SCD's  Code Status: Full code   Family  Communication None  Procedures: 3/27-egd  CONSULTS:  GI  Time spent 30 minutes-Greater than 50% of this time was spent in counseling, explanation of diagnosis, planning of further management, and coordination of care.  MEDICATIONS: Scheduled Meds: . sodium chloride   Intravenous Once  . sodium chloride   Intravenous Once  . calcium-vitamin D  1 tablet Oral Daily  . cholecalciferol  1,000 Units Oral Daily  . metoprolol tartrate  12.5 mg Oral BID  . multivitamin with minerals  1 tablet Oral Daily  . omega-3 acid ethyl esters  1 g Oral Daily  . pantoprazole (PROTONIX) IV  40 mg Intravenous Q12H  . sodium chloride flush  3 mL Intravenous Q12H  . vitamin C  500 mg Oral Daily   Continuous Infusions:  PRN Meds:.HYDROcodone-acetaminophen, morphine injection, ondansetron **OR** ondansetron (ZOFRAN) IV    PHYSICAL EXAM: Vital signs in last 24 hours: Filed Vitals:   05/28/15 1819 05/28/15 2107 05/29/15 0431 05/29/15 0831  BP: 109/51 114/42 113/49 102/67  Pulse: 66 72 60 58  Temp: 98.4 F (36.9 C) 98.5 F (36.9 C) 98.7 F (37.1 C) 98.6 F (37 C)  TempSrc: Oral Oral Oral Oral  Resp: 20 18 16 17   Weight:  87.635 kg (193 lb 3.2 oz)    SpO2: 100% 98% 95% 99%    Weight change: -1.769 kg (-3 lb 14.4 oz) Filed Weights   05/27/15 2045 05/28/15 2107  Weight: 89.404 kg (197 lb 1.6 oz) 87.635 kg (193 lb 3.2 oz)   Body mass index is 37.73 kg/(m^2).   Gen Exam: Awake and alert with clear speech.  Neck: Supple, No JVD.   Chest: B/L Clear.   CVS: S1 S2 Regular, no murmurs.  Abdomen: soft, BS +, non tender, non distended.  Extremities: no edema, lower extremities warm to touch. Neurologic: Non Focal.   Skin: No Rash.   Wounds: N/A.    Intake/Output from previous day:  Intake/Output Summary (Last 24 hours) at 05/29/15 1544 Last data filed at 05/29/15 1330  Gross per 24 hour  Intake    575 ml  Output    900 ml  Net   -325 ml     LAB RESULTS: CBC  Recent Labs Lab  05/27/15 0317 05/27/15 1645 05/28/15 0501 05/28/15 2050 05/29/15 0619  WBC 9.8 11.3* 9.2 9.9 8.7  HGB 8.0* 8.5* 7.4* 9.1* 8.7*  HCT 24.2* 26.6* 22.6* 27.3* 27.1*  PLT 172 209 172 184 195  MCV 89.3 91.1 91.5 91.0 91.6  MCH 29.5 29.1 30.0 30.3 29.4  MCHC 33.1 32.0 32.7 33.3 32.1  RDW 18.1* 18.5* 18.5* 17.6* 17.4*    Chemistries   Recent Labs Lab 05/25/15 0525 05/26/15 0400 05/27/15 0317 05/28/15 0501 05/28/15 1046  NA 147* 146* 142 139 141  K 4.2 3.8 3.8 4.0 3.9  CL 119* 116* 112* 111 110  CO2 22 24 23 24 25   GLUCOSE 130* 102* 101* 98 94  BUN 91* 55* 33* 24* 20  CREATININE 0.75 0.86 0.78 0.91 0.83  CALCIUM 8.7* 8.5* 8.4* 7.9* 8.3*    CBG:  Recent Labs Lab 05/27/15 0757 05/28/15 0752  GLUCAP 90 90    GFR Estimated Creatinine Clearance: 44.3 mL/min (by C-G formula based on Cr of 0.83).  Coagulation profile  Recent Labs Lab 05/23/15 1615 05/24/15 1758 05/25/15 1046 05/26/15 0400 05/27/15 0317  INR 3.0 3.34* 1.40 1.26 1.18    Cardiac Enzymes  Recent Labs Lab 05/24/15 2039 05/25/15 0525  TROPONINI 0.04* 0.03    Invalid input(s): POCBNP No results for input(s): DDIMER in the last 72 hours. No results for input(s): HGBA1C in the last 72 hours. No results for input(s): CHOL, HDL, LDLCALC, TRIG, CHOLHDL, LDLDIRECT in the last 72 hours. No results for input(s): TSH, T4TOTAL, T3FREE, THYROIDAB in the last 72 hours.  Invalid input(s): FREET3 No results for input(s): VITAMINB12, FOLATE, FERRITIN, TIBC, IRON, RETICCTPCT in the last 72 hours. No results for input(s): LIPASE, AMYLASE in the last 72 hours.  Urine Studies No results for input(s): UHGB, CRYS in the last 72 hours.  Invalid input(s): UACOL, UAPR, USPG, UPH, UTP, UGL, UKET, UBIL, UNIT, UROB, ULEU, UEPI, UWBC, URBC, UBAC, CAST, UCOM, BILUA  MICROBIOLOGY: Recent Results (from the past 240 hour(s))  MRSA PCR Screening     Status: None   Collection Time: 05/24/15 10:06 PM  Result Value Ref  Range Status   MRSA by PCR NEGATIVE NEGATIVE Final    Comment:        The GeneXpert MRSA Assay (FDA approved for NASAL specimens only), is one component of a comprehensive MRSA colonization surveillance program. It is not intended to diagnose MRSA infection nor to guide or monitor treatment for MRSA infections.     RADIOLOGY STUDIES/RESULTS: Dg Chest 2 View  05/24/2015  CLINICAL DATA:  Generalized fatigue and weakness for several days. Shortness of breath. EXAM: CHEST  2 VIEW COMPARISON:  05/01/2012 FINDINGS: The cardiomediastinal silhouette is unchanged, with the cardiac silhouette remaining mildly enlarged. The lungs are well inflated. Vascular congestion and interstitial densities on the prior study have resolved. There is no evidence of acute airspace consolidation, edema, pleural effusion, or pneumothorax. Advanced degenerative changes are present about both shoulders, and right axillary surgical clips are noted. IMPRESSION: No active cardiopulmonary disease. Electronically Signed  By: Logan Bores M.D.   On: 05/24/2015 17:48   Dg Shoulder Right  05/25/2015  CLINICAL DATA:  General high shoulder pain. Pain with moving question RIGHT shoulder for 9 months. EXAM: RIGHT SHOULDER - 2+ VIEW COMPARISON:  Shoulder Radiograph 04/11/2007, chest radiograph 05/01/2012 FINDINGS: No acute fracture dislocation of the RIGHT shoulder. There is severe degenerative change of the glenohumeral joint with erosion of the humeral head and fragmentation of inferior aspect of the glenoid fossa. There is heterotrophic ossification. Findings are similar to but progressed from chest radiograph of 05/01/2012 and shoulder film of 04/11/2007. IMPRESSION: Progressive severe degenerative arthropathy of the RIGHT shoulder joint. Electronically Signed   By: Suzy Bouchard M.D.   On: 05/25/2015 09:25   Dg Tibia/fibula Left  05/25/2015  CLINICAL DATA:  82 year old female with a history of calf pain for 4 years. EXAM: LEFT  TIBIA AND FIBULA - 2 VIEW COMPARISON:  05/02/2012 FINDINGS: Diffuse osteopenia. Surgical changes of distal fibular open reduction internal fixation. Posttraumatic changes of the proximal fibula, with healed fracture site. Advanced degenerative changes of the knee joint. Degenerative changes of the ankle joint. No displaced fracture identified. IMPRESSION: Negative for acute bony abnormality. Osteopenia. Posttraumatic changes of the proximal fibula with surgical changes of open reduction internal fixation of distal fibula. Advanced degenerative changes of the knee and ankle. Signed, Dulcy Fanny. Earleen Newport, DO [facility] Radiology Electronically Signed   By: Corrie Mckusick D.O.   On: 05/25/2015 09:24    Oren Binet, MD  [facility] Pager:[phone]  If 7PM-7AM, please contact night-coverage [email or web address] Password [facility] 05/29/2015, 3:44 PM   LOS: 5 days

## 2015-05-29 NOTE — Clinical Social Work Placement (Signed)
   CLINICAL SOCIAL WORK PLACEMENT  NOTE  Date:  05/29/2015  Patient Details  Name: [name] SKORUPSKI MRN: [id number] Date of Birth: [birth year]  Clinical Social Work is seeking post-discharge placement for this patient at the [place] level of care (*CSW will initial, date and re-position this form in  chart as items are completed):  No   Patient/family provided with [facility] [facility] Work Department's list of facilities offering this level of care within the geographic area requested by the patient (or if unable, by the patient's family).  Yes   Patient/family informed of their freedom to choose among providers that offer the needed level of care, that participate in Medicare, Medicaid or managed care program needed by the patient, have an available bed and are willing to accept the patient.  No   Patient/family informed of [facility]'s ownership interest in [facility] and [facility], as well as of the fact that they are under no obligation to receive care at these facilities.  PASRR submitted to EDS on       PASRR number received on       Existing PASRR number confirmed on 05/28/15     FL2 transmitted to all facilities in geographic area requested by pt/family on 05/29/15     FL2 transmitted to all facilities within larger geographic area on       Patient informed that his/her managed care company has contracts with or will negotiate with certain facilities, including the following:            Patient/family informed of bed offers received.  Patient chooses bed at       Physician recommends and patient chooses bed at      Patient to be transferred to   on  .  Patient to be transferred to facility by       Patient family notified on   of transfer.  Name of family member notified:        PHYSICIAN       Additional Comment:    _______________________________________________ Sable Feil, LCSW 05/29/2015, 2:40 PM

## 2015-05-29 NOTE — Progress Notes (Signed)
Patient ID: [name], female   DOB: Sep 06, 1922, 82 y.o.   MRN: [id number] [facility] Gastroenterology Progress Note  [name] 82 y.o. [birth year]   Subjective: Feels good. No complaints.  Objective: Vital signs in last 24 hours: Filed Vitals:   05/29/15 0431 05/29/15 0831  BP: 113/49 102/67  Pulse: 60 58  Temp: 98.7 F (37.1 C) 98.6 F (37 C)  Resp: 16 17    Physical Exam: Gen: alert, no acute distress, elderly, frail Abd: soft, nontender, nondistended, +BS  Lab Results:  Recent Labs  05/28/15 0501 05/28/15 1046  NA 139 141  K 4.0 3.9  CL 111 110  CO2 24 25  GLUCOSE 98 94  BUN 24* 20  CREATININE 0.91 0.83  CALCIUM 7.9* 8.3*   No results for input(s): AST, ALT, ALKPHOS, BILITOT, PROT, ALBUMIN in the last 72 hours.  Recent Labs  05/28/15 2050 05/29/15 0619  WBC 9.9 8.7  HGB 9.1* 8.7*  HCT 27.3* 27.1*  MCV 91.0 91.6  PLT 184 195    Recent Labs  05/27/15 0317  LABPROT 15.2  INR 1.18      Assessment/Plan: 82 yo with recent GI bleed from gastric ulcer. PPI PO BID at discharge for 3 months and then QD. Will f/u on H. Pylori serology as outpt. Will sign off. Call if questions.   [name] C. 05/29/2015, 12:00 PM  Pager [phone]  If no answer or after 5 PM call [phone]

## 2015-05-29 NOTE — NC FL2 (Signed)
[place] LEVEL OF CARE SCREENING TOOL     IDENTIFICATION  Patient Name: [name] Birthdate: 1922-10-15 Sex: female Admission Date (Current Location): 05/24/2015  [facility] and [place] Number:  Herbalist and Address:  [facility] [place], [place] [address] [place], [place] [address]      Provider Number: [id number]  Attending Physician Name and Address:  Jonetta Osgood, MD  Relative Name and Phone Number:  Rubani Kruszka - brother.  Phone number [phone].    Current Level of Care: Hospital Recommended Level of Care: [place] Prior Approval Number:    Date Approved/Denied:   PASRR Number: [id number]  Discharge Plan: SNF    Current Diagnoses: Patient Active Problem List   Diagnosis Date Noted  . Elevated troponin 05/25/2015  . GIB (gastrointestinal bleeding) 05/24/2015  . GI bleed 05/24/2015  . Right shoulder pain 05/24/2015  . Left leg pain 05/24/2015  . Bleeding gastrointestinal   . Encounter for therapeutic drug monitoring 09/15/2013  . Hip pain 05/02/2012  . Fall 05/02/2012  . Long term (current) use of anticoagulants 08/08/2010  . DEGENERATIVE JOINT DISEASE 08/06/2008  . Malignant neoplasm of female breast ([place]) 08/03/2008  . Essential hypertension 08/03/2008  . Atrial fibrillation ([place]) 08/03/2008  . Diastolic heart failure ([place]) 08/03/2008  . EDEMA 08/03/2008  . CHEST PAIN 08/03/2008    Orientation RESPIRATION BLADDER Height & Weight     Self, Time, Situation, Place  Normal Continent Weight: 193 lb 3.2 oz (87.635 kg) Height:     BEHAVIORAL SYMPTOMS/MOOD NEUROLOGICAL BOWEL NUTRITION STATUS      Continent Diet (Heart healthy - Carb modified)  AMBULATORY STATUS COMMUNICATION OF NEEDS Skin   Limited Assist (Patient walked 30 ft (15 ft then 15 ft. with sitting rest break)) Verbally Other (Comment) (Ecchymosis on arm and buttocks. Skin tags on back and discoloration to both legs.)                       Personal Care Assistance Level of Assistance  Bathing, Feeding, Dressing Bathing Assistance: Maximum assistance Feeding assistance: Independent Dressing Assistance: Maximum assistance     Functional Limitations Info  Sight, Hearing, Speech Sight Info: Impaired (Glasses noted in eyeglass case, may only be needed for reading.) Hearing Info: Adequate Speech Info: Adequate    SPECIAL CARE FACTORS FREQUENCY  PT (By licensed PT), OT (By licensed OT)     PT Frequency: Evaluated 3/25. A minimum of 2X per week recommended. OT Frequency: Evaluated 3/27. A minimum of 2X per week recommended.            Contractures Contractures Info: Not present    Additional Factors Info  Code Status, Allergies Code Status Info: Full Allergies Info: Meperidine HcL, Oxycodone-acetaminophen.           Current Medications (05/29/2015):  This is the current hospital active medication list Current Facility-Administered Medications  Medication Dose Route Frequency Provider Last Rate Last Dose  . 0.9 %  sodium chloride infusion   Intravenous Once Davonna Belling, MD      . 0.9 %  sodium chloride infusion   Intravenous Once Domenic Polite, MD      . calcium-vitamin D (OSCAL WITH D) 500-200 MG-UNIT per tablet 1 tablet  1 tablet Oral Daily Ivor Costa, MD   1 tablet at 05/29/15 1109  . cholecalciferol (VITAMIN D) tablet 1,000 Units  1,000 Units Oral Daily Ivor Costa, MD   1,000 Units at  05/29/15 1109  . HYDROcodone-acetaminophen (NORCO/VICODIN) 5-325 MG per tablet 1 tablet  1 tablet Oral Q6H PRN Ivor Costa, MD   1 tablet at 05/29/15 0015  . metoprolol tartrate (LOPRESSOR) tablet 12.5 mg  12.5 mg Oral BID Domenic Polite, MD   12.5 mg at 05/29/15 1109  . morphine 2 MG/ML injection 1 mg  1 mg Intravenous Q3H PRN Ivor Costa, MD   1 mg at 05/27/15 0133  . multivitamin with minerals tablet 1 tablet  1 tablet Oral Daily Ivor Costa, MD   1 tablet at 05/29/15 1109  . omega-3 acid ethyl esters (LOVAZA)  capsule 1 g  1 g Oral Daily Ivor Costa, MD   1 g at 05/29/15 1109  . ondansetron (ZOFRAN) tablet 4 mg  4 mg Oral Q6H PRN Ivor Costa, MD       Or  . ondansetron [facility]) injection 4 mg  4 mg Intravenous Q6H PRN Ivor Costa, MD      . pantoprazole (PROTONIX) injection 40 mg  40 mg Intravenous Q12H Ivor Costa, MD   40 mg at 05/29/15 1109  . sodium chloride flush (NS) 0.9 % injection 3 mL  3 mL Intravenous Q12H Ivor Costa, MD   3 mL at 05/29/15 1110  . vitamin C (ASCORBIC ACID) tablet 500 mg  500 mg Oral Daily Ivor Costa, MD   500 mg at 05/29/15 1109     Discharge Medications: Please see discharge summary for a list of discharge medications.  Relevant Imaging Results:  Relevant Lab Results:   Additional Information ss#[id number].   Sable Feil, LCSW

## 2015-05-29 NOTE — Clinical Social Work Note (Signed)
CSW met with patient, her friend [name] [phone]), nephew [name] [phone] or [phone]) and brother, [name] [phone]) regarding discharge plans for patient. Ms. [name] and family feels that patient will need rehab and patient agreeable. Her preference is [facility] as she had a very good experience there with rehab. The 2nd choice is [facility]. Ms. [name] provided with SNF list for [facility]. Ms. Raney has given CSW permission to call brother, nephew or friend and indicated that [name] should be the first contact.   Amr Sturtevant Givens, MSW, LCSW Licensed Clinical Social Worker [place] [phone]

## 2015-05-30 DIAGNOSIS — I482 Chronic atrial fibrillation: Secondary | ICD-10-CM

## 2015-05-30 DIAGNOSIS — D62 Acute posthemorrhagic anemia: Secondary | ICD-10-CM

## 2015-05-30 DIAGNOSIS — K254 Chronic or unspecified gastric ulcer with hemorrhage: Secondary | ICD-10-CM

## 2015-05-30 LAB — GLUCOSE, CAPILLARY: Glucose-Capillary: 90 mg/dL (ref 65–99)

## 2015-05-30 LAB — CBC
HCT: 26.1 % — ABNORMAL LOW (ref 36.0–46.0)
Hemoglobin: 8.4 g/dL — ABNORMAL LOW (ref 12.0–15.0)
MCH: 29.5 pg (ref 26.0–34.0)
MCHC: 32.2 g/dL (ref 30.0–36.0)
MCV: 91.6 fL (ref 78.0–100.0)
Platelets: 215 10*3/uL (ref 150–400)
RBC: 2.85 MIL/uL — ABNORMAL LOW (ref 3.87–5.11)
RDW: 16.9 % — ABNORMAL HIGH (ref 11.5–15.5)
WBC: 8 10*3/uL (ref 4.0–10.5)

## 2015-05-30 LAB — H. PYLORI ANTIBODY, IGG: H Pylori IgG: <0.9 U/mL (ref 0.0–0.8)

## 2015-05-30 MED ORDER — PANTOPRAZOLE SODIUM 40 MG PO TBEC: 40.0000 mg | DELAYED_RELEASE_TABLET | Freq: Two times a day (BID) | ORAL | Status: DC

## 2015-05-30 MED ORDER — POLYETHYLENE GLYCOL 3350 17 G PO PACK: 17.0000 g | PACK | Freq: Every day | ORAL | Status: DC

## 2015-05-30 MED ORDER — SENNA 8.6 MG PO TABS
2.0000 | ORAL_TABLET | Freq: Every day | ORAL | Status: DC
Start: 2015-05-30 — End: 2019-08-01

## 2015-05-30 MED ORDER — FERROUS SULFATE 325 (65 FE) MG PO TABS: 325.0000 mg | ORAL_TABLET | Freq: Three times a day (TID) | ORAL | Status: DC

## 2015-05-30 MED ORDER — HYDROCODONE-ACETAMINOPHEN 5-325 MG PO TABS: 1.0000 | ORAL_TABLET | Freq: Four times a day (QID) | ORAL | Status: DC | PRN

## 2015-05-30 NOTE — Clinical Social Work Placement (Signed)
   CLINICAL SOCIAL WORK PLACEMENT  NOTE 05/30/15 - PATIENT DISCHARGED TO [facility]  Date:  05/30/2015  Patient Details  Name: [name] MRN: [id number] Date of Birth: 1922-08-30  Clinical Social Work is seeking post-discharge placement for this patient at the [place] level of care (*CSW will initial, date and re-position this form in  chart as items are completed):  No   Patient/family provided with [place] Work Department's list of facilities offering this level of care within the geographic area requested by the patient (or if unable, by the patient's family).  Yes   Patient/family informed of their freedom to choose among providers that offer the needed level of care, that participate in Medicare, Medicaid or managed care program needed by the patient, have an available bed and are willing to accept the patient.  No   Patient/family informed of [facility]'s ownership interest in [facility] and [facility], as well as of the fact that they are under no obligation to receive care at these facilities.  PASRR submitted to EDS on       PASRR number received on       Existing PASRR number confirmed on 05/28/15     FL2 transmitted to all facilities in geographic area requested by pt/family on 05/29/15     FL2 transmitted to all facilities within larger geographic area on       Patient informed that his/her managed care company has contracts with or will negotiate with certain facilities, including the following:         05/30/15   Patient/family informed of bed offers received.  Patient chooses bed at  [place] recommends and patient chooses bed at      Patient to be transferred to  [facility] on  05/30/15.  Patient to be transferred to facility by  ambulance     Patient family notified on  05/30/15 of transfer.  Name of family member notified:    Cloyde Reams, friend at the bedside    PHYSICIAN      Additional Comment:    _______________________________________________ Sable Feil, LCSW 05/30/2015, 3:09 PM

## 2015-05-30 NOTE — Discharge Summary (Signed)
PATIENT DETAILS Name: [name] Age: 82 y.o. Sex: female Date of Birth: [birth year] MRN: [id number]. Admitting Physician: Ivor Costa, MD [id number], Provider, MD  Admit Date: 05/24/2015 Discharge date: 05/30/2015  Recommendations for Outpatient Follow-up:  1. Avoid NSAIDs  2. Coumadin being resumed on 3/30 after extensive discussion with patient. Please follow INR closely.  3. Please repeat CBC and chemistry panel in 1 week  4. PPI twice a day for 3 months, and then daily 5. Metoprolol discontinued as patient having intermittent sinus pauses up to 2 seconds-ensure follow-up with cardiology.  PRIMARY DISCHARGE DIAGNOSIS:  Principal Problem:   GIB (gastrointestinal bleeding) Active Problems:   Malignant neoplasm of female breast (HCC)   Essential hypertension   Atrial fibrillation (HCC)   Diastolic heart failure ([place])   Encounter for therapeutic drug monitoring   GI bleed   Right shoulder pain   Left leg pain   Elevated troponin      PAST MEDICAL HISTORY: Past Medical History  Diagnosis Date  . CARCINOMA, BREAST   . HYPERTENSION   . Atrial fibrillation ([place])   . Unspecified diastolic heart failure   . DEGENERATIVE JOINT DISEASE   . Edema   . CHEST PAIN   . Long term (current) use of anticoagulants     DISCHARGE MEDICATIONS: Current Discharge Medication List    START taking these medications   Details  ferrous sulfate 325 (65 FE) MG tablet Take 1 tablet (325 mg total) by mouth 3 (three) times daily with meals.    pantoprazole (PROTONIX) 40 MG tablet Take 1 tablet (40 mg total) by mouth 2 (two) times daily. PPI twice a day for 3 months, and then daily    polyethylene glycol (MIRALAX) packet Take 17 g by mouth daily.    senna (SENOKOT) 8.6 MG TABS tablet Take 2 tablets (17.2 mg total) by mouth at bedtime.      CONTINUE these medications which have CHANGED   Details  HYDROcodone-acetaminophen (NORCO/VICODIN) 5-325 MG tablet Take 1 tablet by mouth every  6 (six) hours as needed. Qty: 30 tablet, Refills: 0      CONTINUE these medications which have NOT CHANGED   Details  calcium-vitamin D (OSCAL WITH D) 500-200 MG-UNIT per tablet Take 1 tablet by mouth daily.    cholecalciferol (VITAMIN D) 1000 UNITS tablet Take 1,000 Units by mouth daily.    Multiple Vitamins-Minerals (CENTRUM PO) Take 1 tablet by mouth daily.    Omega-3 Fatty Acids (FISH OIL PO) Take 1 tablet by mouth daily.    potassium chloride SA (K-DUR,KLOR-CON) 20 MEQ tablet TAKE ONE TABLET BY MOUTH TWICE DAILY  Qty: 180 tablet, Refills: 3    torsemide (DEMADEX) 20 MG tablet Take 1 tablet (20 mg total) by mouth 2 (two) times daily. Qty: 180 tablet, Refills: 3    vitamin C (ASCORBIC ACID) 500 MG tablet Take 500 mg by mouth daily.    warfarin (COUMADIN) 6 MG tablet TAKE AS DIRECTED BY COUMADIN CLINIC Qty: 130 tablet, Refills: 1      STOP taking these medications     metoprolol tartrate (LOPRESSOR) 25 MG tablet      naproxen sodium (ANAPROX) 220 MG tablet         ALLERGIES:   Allergies  Allergen Reactions  . Meperidine Hcl Nausea Only  . Oxycodone-Acetaminophen Nausea Only    BRIEF HPI:  See H&P, Labs, Consult and Test reports for all details in brief, patient is a 82 year old female with history of atrial fibrillation  on Coumadin, history of generalized arthritis taking intermittent nonsteroidal anti-inflammatory medications were presented with generalized weakness and dark stools.  CONSULTATIONS:   GI  PERTINENT RADIOLOGIC STUDIES: Dg Chest 2 View  05/24/2015  CLINICAL DATA:  Generalized fatigue and weakness for several days. Shortness of breath. EXAM: CHEST  2 VIEW COMPARISON:  05/01/2012 FINDINGS: The cardiomediastinal silhouette is unchanged, with the cardiac silhouette remaining mildly enlarged. The lungs are well inflated. Vascular congestion and interstitial densities on the prior study have resolved. There is no evidence of acute airspace consolidation,  edema, pleural effusion, or pneumothorax. Advanced degenerative changes are present about both shoulders, and right axillary surgical clips are noted. IMPRESSION: No active cardiopulmonary disease. Electronically Signed   By: Logan Bores M.D.   On: 05/24/2015 17:48   Dg Shoulder Right  05/25/2015  CLINICAL DATA:  General high shoulder pain. Pain with moving question RIGHT shoulder for 9 months. EXAM: RIGHT SHOULDER - 2+ VIEW COMPARISON:  Shoulder Radiograph 04/11/2007, chest radiograph 05/01/2012 FINDINGS: No acute fracture dislocation of the RIGHT shoulder. There is severe degenerative change of the glenohumeral joint with erosion of the humeral head and fragmentation of inferior aspect of the glenoid fossa. There is heterotrophic ossification. Findings are similar to but progressed from chest radiograph of 05/01/2012 and shoulder film of 04/11/2007. IMPRESSION: Progressive severe degenerative arthropathy of the RIGHT shoulder joint. Electronically Signed   By: Suzy Bouchard M.D.   On: 05/25/2015 09:25   Dg Tibia/fibula Left  05/25/2015  CLINICAL DATA:  82 year old female with a history of calf pain for 4 years. EXAM: LEFT TIBIA AND FIBULA - 2 VIEW COMPARISON:  05/02/2012 FINDINGS: Diffuse osteopenia. Surgical changes of distal fibular open reduction internal fixation. Posttraumatic changes of the proximal fibula, with healed fracture site. Advanced degenerative changes of the knee joint. Degenerative changes of the ankle joint. No displaced fracture identified. IMPRESSION: Negative for acute bony abnormality. Osteopenia. Posttraumatic changes of the proximal fibula with surgical changes of open reduction internal fixation of distal fibula. Advanced degenerative changes of the knee and ankle. Signed, Dulcy Fanny. Earleen Newport, DO [facility] Electronically Signed   By: Corrie Mckusick D.O.   On: 05/25/2015 09:24     PERTINENT LAB RESULTS: CBC:  Recent  Labs  05/29/15 0619 05/30/15 0542  WBC 8.7 8.0  HGB 8.7* 8.4*  HCT 27.1* 26.1*  PLT 195 215   CMET CMP     Component Value Date/Time   NA 141 05/28/2015 1046   K 3.9 05/28/2015 1046   CL 110 05/28/2015 1046   CO2 25 05/28/2015 1046   GLUCOSE 94 05/28/2015 1046   BUN 20 05/28/2015 1046   CREATININE 0.83 05/28/2015 1046   CALCIUM 8.3* 05/28/2015 1046   PROT 5.0* 05/24/2015 1714   ALBUMIN 2.6* 05/24/2015 1714   AST 26 05/24/2015 1714   ALT 16 05/24/2015 1714   ALKPHOS 51 05/24/2015 1714   BILITOT 0.4 05/24/2015 1714   GFRNONAA >60 05/28/2015 1046   GFRAA >60 05/28/2015 1046    GFR Estimated Creatinine Clearance: 44.3 mL/min (by C-G formula based on Cr of 0.83). No results for input(s): LIPASE, AMYLASE in the last 72 hours. No results for input(s): CKTOTAL, CKMB, CKMBINDEX, TROPONINI in the last 72 hours. Invalid input(s): POCBNP No results for input(s): DDIMER in the last 72 hours. No results for input(s): HGBA1C in the last 72 hours. No results for input(s): CHOL, HDL, LDLCALC, TRIG, CHOLHDL, LDLDIRECT in the last 72 hours. No results for  input(s): TSH, T4TOTAL, T3FREE, THYROIDAB in the last 72 hours.  Invalid input(s): FREET3 No results for input(s): VITAMINB12, FOLATE, FERRITIN, TIBC, IRON, RETICCTPCT in the last 72 hours. Coags: No results for input(s): INR in the last 72 hours.  Invalid input(s): PT Microbiology: Recent Results (from the past 240 hour(s))  MRSA PCR Screening     Status: None   Collection Time: 05/24/15 10:06 PM  Result Value Ref Range Status   MRSA by PCR NEGATIVE NEGATIVE Final    Comment:        The GeneXpert MRSA Assay (FDA approved for NASAL specimens only), is one component of a comprehensive MRSA colonization surveillance program. It is not intended to diagnose MRSA infection nor to guide or monitor treatment for MRSA infections.      BRIEF HOSPITAL COURSE:  Upper GIB (gastrointestinal bleeding): Resolved. No further melena.  Hemoglobin relatively stable.  GI consulted and underwent EGD on 3/28 showed clean-based antral ulcers likely secondary to NSAIDs. Recommendations are to continue with PPI twice a day for 3 months, and then daily.  Per GI, okay to resume anticoagulation-see below  Active Problems: Acute blood loss anemia: Transfused numerous units of PRBC during this hospital stay. Hemoglobin currently stable. Please repeat CBC in 1 week he did  Atrial fibrillation: Rate controlled with metoprolol. CHA2DS2-VASc Score of around 5. Previously on anticoagulation. Spoke with gastroenterology-GI M.D., ulcers are not bleeding-and felt to be from NSAIDs-per GI, okay to resume anticoagulation. Subsequently spoke with patient March times, explained risks of bleeding with resuming anticoagulation, and the risk of CVA of anticoagulation. At this time, patient has elected to resume anticoagulation. If she were to have a second episode of GI bleeding, then we could consider stopping anticoagulation permanently. She is otherwise a very healthy 82 year old, no history of falls.  Intermittent sinus pauses: Pauses upto 2 seconds observed in telemetry, patient was completely asymptomatic and not aware of these pauses. In any event, metoprolol has been discontinued, please ensure follow-up with cardiology.  Chronic diastolic heart failure: Clinically compensated. Resume diuretics in the next few days.  Right shoulder/left leg pain: Chronic issues. Stable for continued outpatient monitoring/treatment.  History of right-sided breast cancer:s/p of lumpectomy and irradiation therapy 2002. Patient has been followed up by oncologist at [facility], last seen was on 04/03/15. Plan is to continue outpatient follow-up with oncologist at [facility].  TODAY-DAY OF DISCHARGE:  Subjective:   Shanya Armwood today has no headache,no chest abdominal pain,no new weakness tingling or numbness, feels much better   Objective:   Blood pressure 114/59, pulse 67,  temperature 100.8 F (38.2 C), temperature source Oral, resp. rate 19, weight 87.635 kg (193 lb 3.2 oz), SpO2 97 %.  Intake/Output Summary (Last 24 hours) at 05/30/15 1153 Last data filed at 05/30/15 1054  Gross per 24 hour  Intake    240 ml  Output    400 ml  Net   -160 ml   Filed Weights   05/27/15 2045 05/28/15 2107  Weight: 89.404 kg (197 lb 1.6 oz) 87.635 kg (193 lb 3.2 oz)    Exam Awake Alert, Oriented *3, No new F.N deficits, Normal affect [place].AT,PERRAL Supple Neck,No JVD, No cervical lymphadenopathy appriciated.  Symmetrical Chest wall movement, Good air movement bilaterally, CTAB RRR,No Gallops,Rubs or new Murmurs, No Parasternal Heave +ve B.Sounds, Abd Soft, Non tender, No organomegaly appriciated, No rebound -guarding or rigidity. No Cyanosis, Clubbing or edema, No new Rash or bruise  DISCHARGE CONDITION: Stable  DISPOSITION: SNF  DISCHARGE INSTRUCTIONS:  Activity:  As tolerated with Full fall precautions use walker/cane & assistance as needed  Get Medicines reviewed and adjusted: Please take all your medications with you for your next visit with your Primary MD  Please request your Primary MD to go over all hospital tests and procedure/radiological results at the follow up, please ask your Primary MD to get all Hospital records sent to his/her office.  If you experience worsening of your admission symptoms, develop shortness of breath, life threatening emergency, suicidal or homicidal thoughts you must seek medical attention immediately by calling 911 or calling your MD immediately  if symptoms less severe.  You must read complete instructions/literature along with all the possible adverse reactions/side effects for all the Medicines you take and that have been prescribed to you. Take any new Medicines after you have completely understood and accpet all the possible adverse reactions/side effects.   Do not drive when taking Pain medications.   Do not take  more than prescribed Pain, Sleep and Anxiety Medications  Special Instructions: If you have smoked or chewed Tobacco  in the last 2 yrs please stop smoking, stop any regular Alcohol  and or any Recreational drug use.  Wear Seat belts while driving.  Please note  You were cared for by a hospitalist during your hospital stay. Once you are discharged, your primary care physician will handle any further medical issues. Please note that NO REFILLS for any discharge medications will be authorized once you are discharged, as it is imperative that you return to your primary care physician (or establish a relationship with a primary care physician if you do not have one) for your aftercare needs so that they can reassess your need for medications and monitor your lab values.   Diet recommendation: Heart Healthy diet  Discharge Instructions    Call MD for:    Complete by:  As directed   Hematochezia, hematemesis and melena     Diet - low sodium heart healthy    Complete by:  As directed      Increase activity slowly    Complete by:  As directed            Follow-up Information    Follow up with Jenkins Rouge, MD. Schedule an appointment as soon as possible for a visit in 1 week.   Specialty:  Cardiology   Why:  Hospital follow up   Contact information:   [id number] [place] [address] [phone]       Follow up with Lear Ng., MD. Schedule an appointment as soon as possible for a visit in 2 weeks.   Specialty:  Gastroenterology   Why:  Hospital follow up   Contact information:   [id number] [place] [address] [phone]      Total Time spent on discharge equals  45 minutes.  SignedOren Binet 05/30/2015 11:53 AM

## 2015-05-30 NOTE — Discharge Instructions (Signed)

## 2015-05-30 NOTE — Care Management Important Message (Signed)
Important Message  Patient Details  Name: [name] MRN: [id number] Date of Birth: Sep 18, 1922   Medicare Important Message Given:  Yes    Senai Kingsley P Taheem Fricke 05/30/2015, 11:43 AM

## 2015-06-17 ENCOUNTER — Ambulatory Visit: Payer: Medicare Other | Admitting: Cardiology

## 2015-06-17 ENCOUNTER — Telehealth: Payer: Self-pay | Admitting: *Deleted

## 2015-06-17 NOTE — Telephone Encounter (Signed)
Pt called to inform [facility] that she was in [place] they are managing her Coumadin.  She stated she has been in the hospital & needed monitoring after hospital d/c.  Advised her to call after she is discharged from the nursing home so we make her an appt in our [facility] clinic & she stated she would once she gets d/c.

## 2015-06-23 NOTE — Progress Notes (Signed)
Cardiology Office Note    Date:  06/24/2015   ID:  [name], DOB 07/04/1922, MRN [id number]  PCP:  Default, Provider, MD  Cardiologist:  Dr. Romero [facility] hospital follow up  History of Present Illness:  [name] is a 82 y.o. female with a history of R Breast CA s/p lumpectomy/XRT (2002), chronic atrial fibrillation on Coumadin, HTN and LE edema who presents for post hospital follow-up.  She was last seen by Dr. Johnsie Cancel on 04/30/14 for general cardiology follow-up and was felt to be doing well from a cardiac standpoint. She was maintained on metoprolol for rate control of her atrial fibrillation as well as Coumadin.  She was recently admitted 3/24-3/30/17 for GI bleed. She was transfused during her admission. She was seen by GI who felt that she had NSAID-induced ulcers. She was placed on a PPI. They were not felt to be currently bleeding and she was resumed on Coumadin. It was felt that if she had recurrent GI bleed she may need to stop Coumadin permanently. She was also noted to have 2 second sinus pauses on telemetry and her metoprolol 25mg  BID was discontinued.  Today she presents to clinic for follow-up. Her INR was 3.9 today at [facility]. It was rechecked today and INR 4.1. Leaving [facility] tomorrow due to insurance issue. She lives alone here in [place]. She has a lot of help from neighbors and family. No CP or SOB. She complains of knee pain. She does cook herself her own breakfast but does require a lot of help. LE edema is chronic and stable. She has had a few falls, but had a fall today with significant bruising on her back. She was trying to get to the bathroom and her leg gave out on her. This happens a lot.   Past Medical History  Diagnosis Date  . CARCINOMA, BREAST   . HYPERTENSION   . Atrial fibrillation ([place])   . Unspecified diastolic heart failure   . DEGENERATIVE JOINT DISEASE   . Edema   . CHEST PAIN   . Long term (current) use of  anticoagulants     Past Surgical History  Procedure Laterality Date  . Knee surgery    . Hip surgery    . Breast lumpectomy    . Tonsillectomy    . Laminectomy    . Esophagogastroduodenoscopy (egd) with propofol Left 05/28/2015    Procedure: ESOPHAGOGASTRODUODENOSCOPY (EGD) WITH PROPOFOL;  Surgeon: Wilford Corner, MD;  Location: [facility];  Service: Endoscopy;  Laterality: Left;    Current Medications: Outpatient Prescriptions Prior to Visit  Medication Sig Dispense Refill  . calcium-vitamin D (OSCAL WITH D) 500-200 MG-UNIT per tablet Take 1 tablet by mouth daily.    . cholecalciferol (VITAMIN D) 1000 UNITS tablet Take 1,000 Units by mouth daily.    . ferrous sulfate 325 (65 FE) MG tablet Take 1 tablet (325 mg total) by mouth 3 (three) times daily with meals.    . Multiple Vitamins-Minerals (CENTRUM PO) Take 1 tablet by mouth daily.    . Omega-3 Fatty Acids (FISH OIL PO) Take 1 tablet by mouth daily.    . pantoprazole (PROTONIX) 40 MG tablet Take 1 tablet (40 mg total) by mouth 2 (two) times daily. PPI twice a day for 3 months, and then daily    . potassium chloride SA (K-DUR,KLOR-CON) 20 MEQ tablet TAKE ONE TABLET BY MOUTH TWICE DAILY  180 tablet 3  . senna (SENOKOT) 8.6 MG TABS tablet Take  2 tablets (17.2 mg total) by mouth at bedtime.    . torsemide (DEMADEX) 20 MG tablet Take 1 tablet (20 mg total) by mouth 2 (two) times daily. 180 tablet 3  . vitamin C (ASCORBIC ACID) 500 MG tablet Take 500 mg by mouth daily.    Marland Kitchen warfarin (COUMADIN) 6 MG tablet TAKE AS DIRECTED BY COUMADIN CLINIC (Patient taking differently: TAKES 9MG  ONLY ON FRI, TAKES 6MG  ALL OTHER DAYS) 130 tablet 1  . HYDROcodone-acetaminophen (NORCO/VICODIN) 5-325 MG tablet Take 1 tablet by mouth every 6 (six) hours as needed. (Patient not taking: Reported on 06/24/2015) 30 tablet 0  . polyethylene glycol (MIRALAX) packet Take 17 g by mouth daily. (Patient not taking: Reported on 06/24/2015)     No facility-administered  medications prior to visit.     Allergies:   Meperidine hcl and Oxycodone-acetaminophen   Social History   Social History  . Marital Status: Single    Spouse Name: N/A  . Number of Children: N/A  . Years of Education: N/A   Social History Main Topics  . Smoking status: Never Smoker   . Smokeless tobacco: None  . Alcohol Use: No  . Drug Use: No  . Sexual Activity: Not Asked   Other Topics Concern  . None   Social History Narrative     Family History:  The patient's family history includes CAD in her father and mother; Heart attack in her brother and mother; Hypertension in her mother; Stroke in her brother.   ROS:   Please see the history of present illness.    ROS All other systems reviewed and are negative.   PHYSICAL EXAM:   VS:  BP 122/80 mmHg  Pulse 79  Ht 5' (1.524 m)  Wt 201 lb (91.173 kg)  BMI 39.26 kg/m2  SpO2 97%   GEN: Well nourished, well developed, in no acute distress HEENT: normal Neck: no JVD, carotid bruits, or masses Cardiac: RRR; no murmurs, rubs, or gallops,no edema  Respiratory:  clear to auscultation bilaterally, normal work of breathing GI: soft, nontender, nondistended, + BS MS: no deformity or atrophy Skin: warm and dry, no rash Neuro:  Alert and Oriented x 3, Strength and sensation are intact Psych: euthymic mood, full affect  Wt Readings from Last 3 Encounters:  06/24/15 201 lb (91.173 kg)  05/28/15 193 lb 3.2 oz (87.635 kg)  04/30/14 211 lb (95.709 kg)      Studies/Labs Reviewed:   EKG:  EKG is NOT ordered today.    Recent Labs: 05/24/2015: ALT 16 05/28/2015: BUN 20; Creatinine, Ser 0.83; Potassium 3.9; Sodium 141 05/30/2015: Hemoglobin 8.4*; Platelets 215   Lipid Panel    Component Value Date/Time   CHOL 89 05/25/2015 0525   TRIG 195* 05/25/2015 0525   HDL 21* 05/25/2015 0525   CHOLHDL 4.2 05/25/2015 0525   VLDL 39 05/25/2015 0525   LDLCALC 29 05/25/2015 0525   LDLDIRECT 139.5 10/25/2008 0958    Additional  studies/ records that were reviewed today include:   2D ECHO: 10/30/13 LV EF: 60% -  65% Study Conclusions - Left ventricle: The cavity size was normal. Wall thickness was increased in a pattern of mild LVH. Systolic function was normal. The estimated ejection fraction was in the range of 60% to 65%. Wall motion was normal; there were no regional wall motion abnormalities. - Aortic valve: There was trivial regurgitation. - Left atrium: The atrium was severely dilated.    ASSESSMENT:    1. Chronic atrial fibrillation (HCC)  2. Bilateral edema of lower extremity   3. Essential hypertension   4. Upper gastrointestinal bleed   5. History of breast cancer      PLAN:  In order of problems listed above:  Chronic atrial fibrillation: Metoprolol tart 25mg  BID recently discontinued during recent admission for 2 second pauses. Rate currently well controlled off all AV nodal blocking agents. CHASDSVASC at least 5. She has been maintained on coumadin. INR today 4.1 today . She had a fall earlier this morning with significant hematoma on her backside. We had a long discussion about continuing her coumadin and we decided that the risk of bleeding, with recent GI bleeding and falls, was higher than her risk of CVA. We will stop coumadin today.   Chronic LE edema: stable. Continue home Torsemide.   HTN: Blood pressure normal on no antihypertensives.  Upper GIB (gastrointestinal bleeding): GI consulted and underwent EGD on 3/28 showed clean-based antral ulcers likely secondary to NSAIDs. Recommendations are to continue with PPI twice a day for 3 months, and then daily. Per GI, okay to resume anticoagulation. Will check CBC today. Discharge hemoglobin was 8.4. As, above with recent fall, we will stop coumadin permanently   History of breast cancer  Medication Adjustments/Labs and Tests Ordered: Current medicines are reviewed at length with the patient today.  Concerns regarding  medicines are outlined above.  Medication changes, Labs and Tests ordered today are listed in the Patient Instructions below. Patient Instructions  Medication Instructions:  Your physician has recommended you make the following change in your medication:  1. Stop coumadin    Labwork: Your physician recommends that you have lab work today: cbc   Testing/Procedures: -None  Follow-Up: Your physician recommends that you keep your scheduled  follow-up appointment with Dr. Johnsie Cancel on July 3   Any Other Special Instructions Will Be Listed Below (If Applicable).  If you need a refill on your cardiac medications before your next appointment, please call your pharmacy.      Renea Ee  06/24/2015 4:41 PM    [facility] [place], [place], [place]  [address] Phone: [phone]; Fax: [phone]

## 2015-06-24 ENCOUNTER — Encounter: Payer: Self-pay | Admitting: Physician Assistant

## 2015-06-24 ENCOUNTER — Ambulatory Visit (INDEPENDENT_AMBULATORY_CARE_PROVIDER_SITE_OTHER): Payer: Medicare Other | Admitting: *Deleted

## 2015-06-24 ENCOUNTER — Ambulatory Visit (INDEPENDENT_AMBULATORY_CARE_PROVIDER_SITE_OTHER): Payer: Medicare Other | Admitting: Physician Assistant

## 2015-06-24 VITALS — BP 122/80 | HR 79 | Ht 60.0 in | Wt 201.0 lb

## 2015-06-24 DIAGNOSIS — R600 Localized edema: Secondary | ICD-10-CM | POA: Diagnosis not present

## 2015-06-24 DIAGNOSIS — I482 Chronic atrial fibrillation, unspecified: Secondary | ICD-10-CM

## 2015-06-24 DIAGNOSIS — Z7901 Long term (current) use of anticoagulants: Secondary | ICD-10-CM | POA: Diagnosis not present

## 2015-06-24 DIAGNOSIS — K922 Gastrointestinal hemorrhage, unspecified: Secondary | ICD-10-CM | POA: Diagnosis not present

## 2015-06-24 DIAGNOSIS — I10 Essential (primary) hypertension: Secondary | ICD-10-CM

## 2015-06-24 DIAGNOSIS — Z853 Personal history of malignant neoplasm of breast: Secondary | ICD-10-CM

## 2015-06-24 DIAGNOSIS — Z5181 Encounter for therapeutic drug level monitoring: Secondary | ICD-10-CM

## 2015-06-24 DIAGNOSIS — I4891 Unspecified atrial fibrillation: Secondary | ICD-10-CM | POA: Diagnosis not present

## 2015-06-24 LAB — CBC WITH DIFFERENTIAL/PLATELET
Basophils Absolute: 0 cells/uL (ref 0–200)
Basophils Relative: 0 %
Eosinophils Absolute: 190 cells/uL (ref 15–500)
Eosinophils Relative: 2 %
HCT: 34.2 % — ABNORMAL LOW (ref 35.0–45.0)
Hemoglobin: 10.7 g/dL — ABNORMAL LOW (ref 11.7–15.5)
Lymphocytes Relative: 12 %
Lymphs Abs: 1140 cells/uL (ref 850–3900)
MCH: 28.1 pg (ref 27.0–33.0)
MCHC: 31.3 g/dL — ABNORMAL LOW (ref 32.0–36.0)
MCV: 89.8 fL (ref 80.0–100.0)
MPV: 10.2 fL (ref 7.5–12.5)
Monocytes Absolute: 1330 cells/uL — ABNORMAL HIGH (ref 200–950)
Monocytes Relative: 14 %
Neutro Abs: 6840 cells/uL (ref 1500–7800)
Neutrophils Relative %: 72 %
Platelets: 385 10*3/uL (ref 140–400)
RBC: 3.81 MIL/uL (ref 3.80–5.10)
RDW: 15.8 % — ABNORMAL HIGH (ref 11.0–15.0)
WBC: 9.5 10*3/uL (ref 3.8–10.8)

## 2015-06-24 LAB — POCT INR: INR: 4.1

## 2015-06-24 NOTE — Patient Instructions (Signed)
Medication Instructions:  Your physician has recommended you make the following change in your medication:  1. Stop coumadin    Labwork: Your physician recommends that you have lab work today: cbc   Testing/Procedures: -None  Follow-Up: Your physician recommends that you keep your scheduled  follow-up appointment with Dr. Johnsie Cancel on July 3   Any Other Special Instructions Will Be Listed Below (If Applicable).     If you need a refill on your cardiac medications before your next appointment, please call your pharmacy.

## 2015-06-26 ENCOUNTER — Telehealth: Payer: Self-pay | Admitting: Cardiovascular Disease

## 2015-06-26 NOTE — Telephone Encounter (Signed)
Returned [name] call. Informed her that she is not on the [facility] list, but when labs are resulted, that the nurse would call patient with results. Jan verbalized understanding.

## 2015-06-26 NOTE — Telephone Encounter (Signed)
New message      Calling to get most recent hgb numbers.  Please call pt at [phone].

## 2015-07-24 ENCOUNTER — Other Ambulatory Visit: Payer: Self-pay | Admitting: Cardiovascular Disease

## 2015-09-01 NOTE — Progress Notes (Signed)
Cardiology Office Note    Date:  09/02/2015   ID:  [name], DOB 30-Jan-1923, MRN [id number]  PCP:  Aretta Nip, MD  Cardiologist:  Dr. Johnsie Cancel   Post hospital follow up  History of Present Illness:  [name] is a 82 y.o. female with a history of R Breast CA s/p lumpectomy/XRT (2002), chronic atrial fibrillation on Coumadin, HTN and LE edema who presents for post hospital follow-up.  She was last seen by Dr. Johnsie Cancel on 04/30/14 for general cardiology follow-up and was felt to be doing well from a cardiac standpoint. She was maintained on metoprolol for rate control of her atrial fibrillation as well as Coumadin.  She was recently admitted 3/24-3/30/17 for GI bleed. She was transfused during her admission. She was seen by GI who felt that she had NSAID-induced ulcers. She was placed on a PPI. They were not felt to be currently bleeding and she was resumed on Coumadin. It was felt that if she had recurrent GI bleed she may need to stop Coumadin permanently. She was also noted to have 2 second sinus pauses on telemetry and her metoprolol 25mg  BID was discontinued.   Past Medical History  Diagnosis Date  . CARCINOMA, BREAST   . HYPERTENSION   . Atrial fibrillation ([place])   . Unspecified diastolic heart failure   . DEGENERATIVE JOINT DISEASE   . Edema   . CHEST PAIN   . Long term (current) use of anticoagulants     Past Surgical History  Procedure Laterality Date  . Knee surgery    . Hip surgery    . Breast lumpectomy    . Tonsillectomy    . Laminectomy    . Esophagogastroduodenoscopy (egd) with propofol Left 05/28/2015    Procedure: ESOPHAGOGASTRODUODENOSCOPY (EGD) WITH PROPOFOL;  Surgeon: Wilford Corner, MD;  Location: [facility];  Service: Endoscopy;  Laterality: Left;    Current Medications: Outpatient Prescriptions Prior to Visit  Medication Sig Dispense Refill  . calcium-vitamin D (OSCAL WITH D) 500-200 MG-UNIT per tablet Take 1 tablet by mouth  daily.    . cholecalciferol (VITAMIN D) 1000 UNITS tablet Take 1,000 Units by mouth daily.    . ferrous sulfate 325 (65 FE) MG tablet Take 1 tablet (325 mg total) by mouth 3 (three) times daily with meals.    Marland Kitchen HYDROcodone-acetaminophen (NORCO/VICODIN) 5-325 MG tablet Take 1 tablet by mouth every 6 (six) hours as needed for moderate pain or severe pain.    . Multiple Vitamins-Minerals (CENTRUM PO) Take 1 tablet by mouth daily.    . Omega-3 Fatty Acids (FISH OIL PO) Take 1 tablet by mouth daily.    . pantoprazole (PROTONIX) 40 MG tablet Take 1 tablet (40 mg total) by mouth 2 (two) times daily. PPI twice a day for 3 months, and then daily    . polyethylene glycol (MIRALAX / GLYCOLAX) packet Take 17 g by mouth daily. CONSTIPATION    . potassium chloride SA (K-DUR,KLOR-CON) 20 MEQ tablet TAKE ONE TABLET BY MOUTH TWICE DAILY 180 tablet 3  . senna (SENOKOT) 8.6 MG TABS tablet Take 2 tablets (17.2 mg total) by mouth at bedtime.    . vitamin C (ASCORBIC ACID) 500 MG tablet Take 500 mg by mouth daily.    Marland Kitchen torsemide (DEMADEX) 20 MG tablet Take 1 tablet (20 mg total) by mouth 2 (two) times daily. 180 tablet 3  . warfarin (COUMADIN) 6 MG tablet TAKE AS DIRECTED BY COUMADIN CLINIC 130 tablet 1  No facility-administered medications prior to visit.     Allergies:   Meperidine hcl and Oxycodone-acetaminophen   Social History   Social History  . Marital Status: Single    Spouse Name: N/A  . Number of Children: N/A  . Years of Education: N/A   Social History Main Topics  . Smoking status: Never Smoker   . Smokeless tobacco: None  . Alcohol Use: No  . Drug Use: No  . Sexual Activity: Not Asked   Other Topics Concern  . None   Social History Narrative     Family History:  The patient's family history includes CAD in her father and mother; Heart attack in her brother and mother; Hypertension in her mother; Stroke in her brother.   ROS:   Please see the history of present illness.    ROS All  other systems reviewed and are negative.   PHYSICAL EXAM:   VS:  BP 130/64 mmHg  Pulse 70  Ht 5\' 6"  (1.676 m)  Wt 183 lb 1.9 oz (83.063 kg)  BMI 29.57 kg/m2  SpO2 95%    Affect appropriate Elderly white female  HEENT: normal Neck supple with no adenopathy JVP normal no bruits no thyromegaly Lungs clear with no wheezing and good diaphragmatic motion Heart:  S1/S2 no murmur, no rub, gallop or click PMI normal Abdomen: benighn, BS positve, no tenderness, no AAA no bruit.  No HSM or HJR Distal pulses intact with no bruits Plus 2 LE edema with chronic venous stasis changes  Neuro non-focal Skin warm and dry No muscular weakness   Wt Readings from Last 3 Encounters:  09/02/15 183 lb 1.9 oz (83.063 kg)  06/24/15 201 lb (91.173 kg)  05/28/15 193 lb 3.2 oz (87.635 kg)      Studies/Labs Reviewed:   EKG:  EKG is NOT ordered today.    Recent Labs: 05/24/2015: ALT 16 05/28/2015: BUN 20; Creatinine, Ser 0.83; Potassium 3.9; Sodium 141 06/24/2015: Hemoglobin 10.7*; Platelets 385   Lipid Panel    Component Value Date/Time   CHOL 89 05/25/2015 0525   TRIG 195* 05/25/2015 0525   HDL 21* 05/25/2015 0525   CHOLHDL 4.2 05/25/2015 0525   VLDL 39 05/25/2015 0525   LDLCALC 29 05/25/2015 0525   LDLDIRECT 139.5 10/25/2008 0958    Additional studies/ records that were reviewed today include:   2D ECHO: 10/30/13 LV EF: 60% -  65% Study Conclusions - Left ventricle: The cavity size was normal. Wall thickness was increased in a pattern of mild LVH. Systolic function was normal. The estimated ejection fraction was in the range of 60% to 65%. Wall motion was normal; there were no regional wall motion abnormalities. - Aortic valve: There was trivial regurgitation. - Left atrium: The atrium was severely dilated.    ASSESSMENT:    1. Chronic atrial fibrillation (HCC)      PLAN:  In order of problems listed above:  Chronic atrial fibrillation: Metoprolol tart 25mg  BID  recently discontinued during recent admission for 2 second pauses. Rate currently well controlled off all AV nodal blocking agents. CHASDSVASC at least 5. Coumadin stopped In April due to GI bleed , age and falls  Chronic LE edema: stable. Continue home Torsemide. She gets annoyed at [place] a lot   HTN: Blood pressure normal on no antihypertensives.  Upper GIB (gastrointestinal bleeding): GI consulted and underwent EGD on 3/28 showed clean-based antral ulcers likely secondary to NSAIDs. Recommendations are to continue with PPI twice a day for 3 months,  and then daily. Per GI, okay to resume anticoagulation. Will check CBC today. Discharge hemoglobin was 8.4.  Lab Results  Component Value Date   HCT 34.2* 06/24/2015  Talk with primary about checking Hct/Iron if ok cut back on Iron As it constipates her   History of breast cancer:  no mammography given age   Jenkins Rouge

## 2015-09-02 ENCOUNTER — Encounter: Payer: Self-pay | Admitting: Cardiovascular Disease

## 2015-09-02 ENCOUNTER — Ambulatory Visit (INDEPENDENT_AMBULATORY_CARE_PROVIDER_SITE_OTHER): Payer: Medicare Other | Admitting: Cardiovascular Disease

## 2015-09-02 ENCOUNTER — Ambulatory Visit: Payer: Medicare Other | Admitting: Cardiovascular Disease

## 2015-09-02 VITALS — BP 130/64 | HR 70 | Ht 66.0 in | Wt 183.1 lb

## 2015-09-02 DIAGNOSIS — I482 Chronic atrial fibrillation, unspecified: Secondary | ICD-10-CM

## 2015-09-02 MED ORDER — TORSEMIDE 20 MG PO TABS: 20.0000 mg | ORAL_TABLET | Freq: Two times a day (BID) | ORAL | Status: DC

## 2015-09-02 NOTE — Patient Instructions (Signed)

## 2015-09-05 ENCOUNTER — Telehealth: Payer: Self-pay | Admitting: *Deleted

## 2015-09-05 NOTE — Telephone Encounter (Signed)
Sharyn Lull, patient's DPR, is calling about patient having increased swelling and weight gain of 2 lbs since her visit to our office on Monday. Sharyn Lull stated that patient is feeling fine, but they just don't want patient to get back in the shape she was before she went into the hospital. At this time patient is prescribed Demadex 20 mg by mouth twice daily. Patient is actually taking 40 mg of Demadex at one time daily. Encouraged Sharyn Lull to have patient take the medication as prescribed, and have patient elevate her legs. Sharyn Lull reports patient has been voiding, but not as often. Will forward to Dr. Johnsie Cancel for further advisement.

## 2015-09-05 NOTE — Telephone Encounter (Signed)
Alanda Slim called to discuss patients torsemide dose. She stated that the patient has gained about 5lbs of fluid over one week. She can be reached at  [phone]

## 2015-10-15 ENCOUNTER — Ambulatory Visit: Payer: Medicare Other | Admitting: Cardiovascular Disease

## 2015-11-08 ENCOUNTER — Telehealth: Payer: Self-pay | Admitting: *Deleted

## 2015-11-08 NOTE — Telephone Encounter (Signed)
[place], RN      09/05/15 3:11 PM  Note    Sharyn Lull, patient's DPR, is calling about patient having increased swelling and weight gain of 2 lbs since her visit to our office on Monday. Sharyn Lull stated that patient is feeling fine, but they just don't want patient to get back in the shape she was before she went into the hospital. At this time patient is prescribed Demadex 20 mg by mouth twice daily. Patient is actually taking 40 mg of Demadex at one time daily. Encouraged Sharyn Lull to have patient take the medication as prescribed, and have patient elevate her legs. Sharyn Lull reports patient has been voiding, but not as often. Will forward to Dr. Johnsie Cancel for further advisement.

## 2015-11-08 NOTE — Telephone Encounter (Signed)
Sharyn Lull, patient's DPR, is calling needing to clarify patient's Demadex dose. Went over instructions, that patient should take Demadex 20 mg in the morning and 20 mg in the afternoon.  Sharyn Lull reports that patient has been taking Demadex 40 mg in the morning and 20 mg in the afternoon. Repeated the prescription instructions, so to make it clear what patient should be taking. Sharyn Lull also reported patient is still gaining weight. Informed Sharyn Lull, that patient should come in to see a PA or NP to be evaluated, and that she might need labs since patient has been taking more of her Demadex than instructed. Patient will see Cecilie Kicks NP next week for follow-up. Encouraged Sharyn Lull to give our office a call with any other questions or concerns.

## 2015-11-12 ENCOUNTER — Encounter: Payer: Self-pay | Admitting: Cardiology

## 2015-11-13 ENCOUNTER — Encounter: Payer: Self-pay | Admitting: Cardiology

## 2015-11-13 ENCOUNTER — Ambulatory Visit (INDEPENDENT_AMBULATORY_CARE_PROVIDER_SITE_OTHER): Payer: Medicare Other | Admitting: Cardiology

## 2015-11-13 VITALS — BP 100/70 | HR 74 | Ht 68.0 in | Wt 192.8 lb

## 2015-11-13 DIAGNOSIS — R609 Edema, unspecified: Secondary | ICD-10-CM

## 2015-11-13 DIAGNOSIS — D649 Anemia, unspecified: Secondary | ICD-10-CM | POA: Diagnosis not present

## 2015-11-13 DIAGNOSIS — R0602 Shortness of breath: Secondary | ICD-10-CM | POA: Diagnosis not present

## 2015-11-13 DIAGNOSIS — I482 Chronic atrial fibrillation, unspecified: Secondary | ICD-10-CM

## 2015-11-13 DIAGNOSIS — Z853 Personal history of malignant neoplasm of breast: Secondary | ICD-10-CM

## 2015-11-13 LAB — CBC WITH DIFFERENTIAL/PLATELET
Basophils Absolute: 0 cells/uL (ref 0–200)
Basophils Relative: 0 %
Eosinophils Absolute: 316 cells/uL (ref 15–500)
Eosinophils Relative: 4 %
HCT: 39.9 % (ref 35.0–45.0)
Hemoglobin: 13.2 g/dL (ref 11.7–15.5)
Lymphocytes Relative: 22 %
Lymphs Abs: 1738 cells/uL (ref 850–3900)
MCH: 30.7 pg (ref 27.0–33.0)
MCHC: 33.1 g/dL (ref 32.0–36.0)
MCV: 92.8 fL (ref 80.0–100.0)
MPV: 10.4 fL (ref 7.5–12.5)
Monocytes Absolute: 632 cells/uL (ref 200–950)
Monocytes Relative: 8 %
Neutro Abs: 5214 cells/uL (ref 1500–7800)
Neutrophils Relative %: 66 %
Platelets: 247 10*3/uL (ref 140–400)
RBC: 4.3 MIL/uL (ref 3.80–5.10)
RDW: 15.6 % — ABNORMAL HIGH (ref 11.0–15.0)
WBC: 7.9 10*3/uL (ref 3.8–10.8)

## 2015-11-13 MED ORDER — TORSEMIDE 20 MG PO TABS: 40.0000 mg | ORAL_TABLET | Freq: Every day | ORAL | 3 refills | Status: DC

## 2015-11-13 MED ORDER — TORSEMIDE 20 MG PO TABS: 20.0000 mg | ORAL_TABLET | Freq: Every day | ORAL | 3 refills | Status: DC

## 2015-11-13 NOTE — Progress Notes (Signed)
Cardiology Office Note   Date:  11/13/2015   ID:  [name], DOB 25-Sep-1922, MRN [id number]  PCP:  Aretta Nip, MD  Cardiologist:  Dr. Johnsie Cancel    Chief Complaint  Patient presents with  . Leg Swelling      History of Present Illness: [name] is a 82 y.o. female who presents for edema and clarify meds.  She has a history of R Breast CA s/p lumpectomy/XRT (2002), chronic atrial fibrillation on Coumadin, HTN and LE edema,  She had been admitted 3/24-3/30/17 for GI bleed. She was transfused during her admission. She was seen by GI who felt that she had NSAID-induced ulcers. She was placed on a PPI. They were not felt to be currently bleeding and she was resumed on Coumadin. It was felt that if she had recurrent GI bleed she may need to stop Coumadin permanently. She was also noted to have 2 second sinus pauses on telemetry and her metoprolol 25mg  BID was discontinued.  She has been doing well but now with increased lower ext swelling.  No chest pain but increased SOB with activity.  In addition she has significant rt. Shoulder, scapula pain.  She has had this of and on since fall in the spring.  Now with significant increase in pain.  Discussed she should see her orthopedist.  She complains of constipation on her Iron.  Will recheck CBC and if stable HGB would decrease iron.    Past Medical History:  Diagnosis Date  . Atrial fibrillation ([place])   . CARCINOMA, BREAST   . CHEST PAIN   . DEGENERATIVE JOINT DISEASE   . Edema   . HYPERTENSION   . Long term (current) use of anticoagulants   . Unspecified diastolic heart failure     Past Surgical History:  Procedure Laterality Date  . BREAST LUMPECTOMY    . ESOPHAGOGASTRODUODENOSCOPY (EGD) WITH PROPOFOL Left 05/28/2015   Procedure: ESOPHAGOGASTRODUODENOSCOPY (EGD) WITH PROPOFOL;  Surgeon: Wilford Corner, MD;  Location: [facility] ENDOSCOPY;  Service: Endoscopy;  Laterality: Left;  . HIP SURGERY    . KNEE SURGERY      . LAMINECTOMY    . TONSILLECTOMY       Current Outpatient Prescriptions  Medication Sig Dispense Refill  . calcium-vitamin D (OSCAL WITH D) 500-200 MG-UNIT per tablet Take 1 tablet by mouth daily.    . cholecalciferol (VITAMIN D) 1000 UNITS tablet Take 1,000 Units by mouth daily.    . ferrous sulfate 325 (65 FE) MG tablet Take 1 tablet (325 mg total) by mouth 3 (three) times daily with meals.    Marland Kitchen HYDROcodone-acetaminophen (NORCO/VICODIN) 5-325 MG tablet Take 1 tablet by mouth every 6 (six) hours as needed for moderate pain or severe pain.    . Multiple Vitamins-Minerals (CENTRUM PO) Take 1 tablet by mouth daily.    . Omega-3 Fatty Acids (FISH OIL PO) Take 1 tablet by mouth daily.    . polyethylene glycol (MIRALAX / GLYCOLAX) packet Take 17 g by mouth daily. CONSTIPATION    . potassium chloride SA (K-DUR,KLOR-CON) 20 MEQ tablet TAKE ONE TABLET BY MOUTH TWICE DAILY 180 tablet 3  . senna (SENOKOT) 8.6 MG TABS tablet Take 2 tablets (17.2 mg total) by mouth at bedtime.    . torsemide (DEMADEX) 20 MG tablet Take 1 tablet (20 mg total) by mouth 2 (two) times daily. 180 tablet 3  . vitamin C (ASCORBIC ACID) 500 MG tablet Take 500 mg by mouth daily.  No current facility-administered medications for this visit.     Allergies:   Morphine and related; Sulfa antibiotics; Meperidine; Meperidine hcl; Nsaids; and Oxycodone-acetaminophen    Social History:  The patient  reports that she has never smoked. She has never used smokeless tobacco. She reports that she does not drink alcohol or use drugs.   Family History:  The patient's family history includes CAD in her father and mother; Heart attack in her brother and mother; Hypertension in her mother; Stroke in her brother.    ROS:  General:no colds or fevers, + weight increase due to edema Skin:no rashes or ulcers HEENT:no blurred vision, no congestion CV:see HPI PUL:see HPI GI:no diarrhea constipation or melena, no indigestion GU:no  hematuria, no dysuria MS:+ rt joint pain, no claudication Neuro:no syncope, no lightheadedness Endo:no diabetes, no thyroid disease  Wt Readings from Last 3 Encounters:  11/13/15 192 lb 12.8 oz (87.5 kg)  09/02/15 183 lb 1.9 oz (83.1 kg)  06/24/15 201 lb (91.2 kg)     PHYSICAL EXAM: VS:  BP 100/70   Pulse 74   Ht 5\' 8"  (1.727 m)   Wt 192 lb 12.8 oz (87.5 kg)   BMI 29.32 kg/m  , BMI Body mass index is 29.32 kg/m. General:Pleasant affect, NAD Skin:Warm and dry, brisk capillary refill HEENT:normocephalic, sclera clear, mucus membranes moist Neck:supple, no JVD, no bruits  Heart:irreg irreg without murmur, gallup, rub or click Lungs:clear without rales, rhonchi, or wheezes [id number], non tender, + BS, do not palpate liver spleen or masses Ext:2-+ lower ext edema, 2+ radial pulses Neuro:alert and oriented X 3, MAE, follows commands, + facial symmetry    EKG:  EKG is ordered today. The ekg ordered today demonstrates a fib old septal infarct no chnages in ST depression in lat leads.     Recent Labs: 05/24/2015: ALT 16 05/28/2015: BUN 20; Creatinine, Ser 0.83; Potassium 3.9; Sodium 141 06/24/2015: Hemoglobin 10.7; Platelets 385    Lipid Panel    Component Value Date/Time   CHOL 89 05/25/2015 0525   TRIG 195 (H) 05/25/2015 0525   HDL 21 (L) 05/25/2015 0525   CHOLHDL 4.2 05/25/2015 0525   VLDL 39 05/25/2015 0525   LDLCALC 29 05/25/2015 0525   LDLDIRECT 139.5 10/25/2008 0958       Other studies Reviewed: Additional studies/ records that were reviewed today include:  Echo 2015.  Study Conclusions  - Left ventricle: The cavity size was normal. Wall thickness was increased in a pattern of mild LVH. Systolic function was normal. The estimated ejection fraction was in the range of 60% to 65%. Wall motion was normal; there were no regional wall motion abnormalities. - Aortic valve: There was trivial regurgitation. - Left atrium: The atrium was severely  dilated.  ASSESSMENT AND PLAN:  Chronic LE edema: stable. Continue home Torsemide. Which would be 40 in am and 20 inpm for 1 wek then back to 40 in am.  Also to wrap legs pt canot use support stockings.    Chronic atrial fibrillation: Metoprolol tart 25mg  BID discontinued during recent admission for 2 second pauses. Rate currently well controlled off all AV nodal blocking agents. CHASDSVASC at least 5. Coumadin stopped In April due to GI bleed , age and falls   HTN: Blood pressure normal on no antihypertensives.  Upper GIB (gastrointestinal bleeding): GI consulted and underwent EGD on 3/28 showed clean-based antral ulcers likely secondary to NSAIDs. Recommendations are to continue with PPI twice a day for 3 months, and then daily.  Per GI, okay to resume anticoagulation. Will check CBC today. Discharge hemoglobin was 8.4.  She would like to decrease iron due to constipation.   Current medicines are reviewed with the patient today.  The patient Has no concerns regarding medicines.  The following changes have been made:  See above Labs/ tests ordered today include:see above  Disposition:   FU:  see above  Signed, Cecilie Kicks, NP  11/13/2015 3:34 PM    [facility], [place], [place], [place] Phone: [phone]; Fax: [phone]

## 2015-11-13 NOTE — Patient Instructions (Addendum)
Medication Instructions:  1) INCREASE TORSEMIDE to 40 mg in the AM and 20 mg around 1:00PM for a week, then resume 40 mg daily  Labwork: TODAY: BNP, BMET, TSH, CBC  Testing/Procedures: None  Follow-Up: You have an appointment scheduled with Cecilie Kicks, NP on November 28, 2015 at 1:30PM.  Any Other Special Instructions Will Be Listed Below (If Applicable).     If you need a refill on your cardiac medications before your next appointment, please call your pharmacy.

## 2015-11-14 ENCOUNTER — Other Ambulatory Visit: Payer: Self-pay | Admitting: *Deleted

## 2015-11-14 LAB — BASIC METABOLIC PANEL
BUN: 38 mg/dL — ABNORMAL HIGH (ref 7–25)
CO2: 25 mmol/L (ref 20–31)
Calcium: 9.3 mg/dL (ref 8.6–10.4)
Chloride: 101 mmol/L (ref 98–110)
Creat: 0.98 mg/dL — ABNORMAL HIGH (ref 0.60–0.88)
Glucose, Bld: 91 mg/dL (ref 65–99)
Potassium: 4.3 mmol/L (ref 3.5–5.3)
Sodium: 138 mmol/L (ref 135–146)

## 2015-11-14 LAB — BRAIN NATRIURETIC PEPTIDE: Brain Natriuretic Peptide: 110.3 pg/mL — ABNORMAL HIGH (ref <100)

## 2015-11-14 LAB — TSH: TSH: 4.32 mIU/L

## 2015-11-14 MED ORDER — FERROUS SULFATE 325 (65 FE) MG PO TABS: 325.0000 mg | ORAL_TABLET | Freq: Every day | ORAL | Status: DC

## 2015-11-28 ENCOUNTER — Encounter: Payer: Self-pay | Admitting: Cardiology

## 2015-11-28 ENCOUNTER — Ambulatory Visit (INDEPENDENT_AMBULATORY_CARE_PROVIDER_SITE_OTHER): Payer: Medicare Other | Admitting: Cardiology

## 2015-11-28 ENCOUNTER — Encounter (INDEPENDENT_AMBULATORY_CARE_PROVIDER_SITE_OTHER): Payer: Self-pay

## 2015-11-28 VITALS — BP 110/70 | HR 72 | Ht 68.0 in | Wt 192.0 lb

## 2015-11-28 DIAGNOSIS — R600 Localized edema: Secondary | ICD-10-CM

## 2015-11-28 DIAGNOSIS — I10 Essential (primary) hypertension: Secondary | ICD-10-CM

## 2015-11-28 DIAGNOSIS — Z79899 Other long term (current) drug therapy: Secondary | ICD-10-CM | POA: Diagnosis not present

## 2015-11-28 DIAGNOSIS — I482 Chronic atrial fibrillation, unspecified: Secondary | ICD-10-CM

## 2015-11-28 DIAGNOSIS — D649 Anemia, unspecified: Secondary | ICD-10-CM | POA: Diagnosis not present

## 2015-11-28 NOTE — Progress Notes (Signed)
Cardiology Office Note   Date:  11/28/2015   ID:  [name], DOB [birth year], MRN [id number]  PCP:  Aretta Nip, MD  Cardiologist:  Dr. Johnsie Cancel    Chief Complaint  Patient presents with  . Edema      History of Present Illness: [name] is a 82 y.o. female who presents for edema follow up.  Pt had increased her demadex but was still gaining wt. I saw her and we adjusted her demadex for several days and now she is on 40 mg daily.   She changed her bed- they bought a hospital bed and her shoulder is much improved.  Her wt is about the same but edema has improved.  She is eating well and that may be cause of wt. Gain.  Additionally her H/H is normal we decreased her Iron due to her constipation and she would like to stop it today.    She has a history of R Breast CA s/p lumpectomy/XRT (2002), chronic atrial fibrillation on Coumadin, HTN and LE edema and GI bleed. Last admit to hospital she had 2 sec. Pauses and her BB was stopped.    She has no complaints of dizziness. occ bright blood with stool, she believes is her hemorrhoids. No chest pain and no SOB.    Past Medical History:  Diagnosis Date  . Atrial fibrillation ([place])   . CARCINOMA, BREAST   . CHEST PAIN   . DEGENERATIVE JOINT DISEASE   . Edema   . HYPERTENSION   . Long term (current) use of anticoagulants   . Unspecified diastolic heart failure     Past Surgical History:  Procedure Laterality Date  . BREAST LUMPECTOMY    . ESOPHAGOGASTRODUODENOSCOPY (EGD) WITH PROPOFOL Left 05/28/2015   Procedure: ESOPHAGOGASTRODUODENOSCOPY (EGD) WITH PROPOFOL;  Surgeon: Wilford Corner, MD;  Location: [facility] ENDOSCOPY;  Service: Endoscopy;  Laterality: Left;  . HIP SURGERY    . KNEE SURGERY    . LAMINECTOMY    . TONSILLECTOMY       Current Outpatient Prescriptions  Medication Sig Dispense Refill  . calcium-vitamin D (OSCAL WITH D) 500-200 MG-UNIT per tablet Take 1 tablet by mouth daily.    . cholecalciferol  (VITAMIN D) 1000 UNITS tablet Take 1,000 Units by mouth daily.    . ferrous sulfate 325 (65 FE) MG tablet Take 1 tablet (325 mg total) by mouth daily with breakfast.    . HYDROcodone-acetaminophen (NORCO/VICODIN) 5-325 MG tablet Take 1 tablet by mouth every 6 (six) hours as needed for moderate pain or severe pain.    . Multiple Vitamins-Minerals (CENTRUM PO) Take 1 tablet by mouth daily.    . Omega-3 Fatty Acids (FISH OIL PO) Take 1 tablet by mouth daily.    . polyethylene glycol (MIRALAX / GLYCOLAX) packet Take 17 g by mouth daily. CONSTIPATION    . potassium chloride SA (K-DUR,KLOR-CON) 20 MEQ tablet TAKE ONE TABLET BY MOUTH TWICE DAILY 180 tablet 3  . senna (SENOKOT) 8.6 MG TABS tablet Take 2 tablets (17.2 mg total) by mouth at bedtime.    . torsemide (DEMADEX) 20 MG tablet Take 2 tablets (40 mg total) by mouth daily. 180 tablet 3  . vitamin C (ASCORBIC ACID) 500 MG tablet Take 500 mg by mouth daily.     No current facility-administered medications for this visit.     Allergies:   Morphine and related; Sulfa antibiotics; Meperidine; Meperidine hcl; Nsaids; and Oxycodone-acetaminophen    Social History:  The  patient  reports that she has never smoked. She has never used smokeless tobacco. She reports that she does not drink alcohol or use drugs.   Family History:  The patient's family history includes CAD in her father and mother; Heart attack in her brother and mother; Hypertension in her mother; Stroke in her brother.    ROS:  General:no colds or fevers, no weight changes Skin:no rashes or ulcers HEENT:no blurred vision, no congestion CV:see HPI PUL:see HPI GI:no diarrhea  +constipation or melena, no indigestion GU:no hematuria, no dysuria MS:no joint pain, no claudication Neuro:no syncope, no lightheadedness Endo:no diabetes, no thyroid disease  Wt Readings from Last 3 Encounters:  11/28/15 192 lb (87.1 kg)  11/13/15 192 lb 12.8 oz (87.5 kg)  09/02/15 183 lb 1.9 oz (83.1 kg)       PHYSICAL EXAM: VS:  BP 110/70   Pulse 72   Ht 5\' 8"  (1.727 m)   Wt 192 lb (87.1 kg)   BMI 29.19 kg/m  , BMI Body mass index is 29.19 kg/m. General:Pleasant affect, NAD Skin:Warm and dry, brisk capillary refill HEENT:normocephalic, sclera clear, mucus membranes moist Neck:supple, no JVD, no bruits  Heart:S1S2 RRR without murmur, gallup, rub or click Lungs:clear without rales, rhonchi, or wheezes [id number], non tender, + BS, do not palpate liver spleen or masses Ext:1+ lower ext edema, 2+ pedal pulses, 2+ radial pulses Neuro:alert and oriented X 3, MAE, follows commands, + facial symmetry    EKG:  EKG is NOT ordered today.    Recent Labs: 05/24/2015: ALT 16 11/13/2015: Brain Natriuretic Peptide 110.3; BUN 38; Creat 0.98; Hemoglobin 13.2; Platelets 247; Potassium 4.3; Sodium 138; TSH 4.32    Lipid Panel    Component Value Date/Time   CHOL 89 05/25/2015 0525   TRIG 195 (H) 05/25/2015 0525   HDL 21 (L) 05/25/2015 0525   CHOLHDL 4.2 05/25/2015 0525   VLDL 39 05/25/2015 0525   LDLCALC 29 05/25/2015 0525   LDLDIRECT 139.5 10/25/2008 0958       Other studies Reviewed: Additional studies/ records that were reviewed today include: . 2D ECHO: 10/30/13 LV EF: 60% -  65% Study Conclusions - Left ventricle: The cavity size was normal. Wall thickness was increased in a pattern of mild LVH. Systolic function was normal. The estimated ejection fraction was in the range of 60% to 65%. Wall motion was normal; there were no regional wall motion abnormalities. - Aortic valve: There was trivial regurgitation. - Left atrium: The atrium was severely dilated  ASSESSMENT AND PLAN:  Chronic atrial fibrillation: Metoprolol tart 25mg  BID recently discontinued during recent admission for 2 second pauses. Rate currently well controlled off all AV nodal blocking agents. CHASDSVASC at least 5. Coumadin stopped In April due to GI bleed , age and falls  Chronic LE edema: stable.  Continue home Torsemide.  At 40 mg in am.  HTN: Blood pressure normal on no antihypertensives.  Upper GIB (gastrointestinal bleeding): GI consulted and underwent EGD on 3/28 showed clean-based antral ulcers likely secondary to NSAIDs. Recommendations are to continue with PPI twice a day for 3 months, and then daily. anticoagulation was not resumed.  HGB back to normal,  Will stop Iron but recheck CBC and BMP in 1 month.    Shoulder pain is improved with new bed.    Current medicines are reviewed with the patient today.  The patient Has no concerns regarding medicines.  The following changes have been made:  See above Labs/ tests ordered today include:see above  Disposition:   FU:  see above  Signed, Cecilie Kicks, NP  11/28/2015 2:06 PM    [facility] [place], [place], [place], [place] Phone: [phone]; Fax: [phone]

## 2015-11-28 NOTE — Patient Instructions (Signed)
Your physician has recommended you make the following change in your medication:   [place]    Your physician recommends that you return for lab work in: [place]  OF October 2017  Your physician recommends that you schedule a follow-up appointment in:  [place]  Johnsie Cancel

## 2015-12-30 ENCOUNTER — Other Ambulatory Visit (INDEPENDENT_AMBULATORY_CARE_PROVIDER_SITE_OTHER): Payer: Medicare Other | Admitting: *Deleted

## 2015-12-30 DIAGNOSIS — Z23 Encounter for immunization: Secondary | ICD-10-CM

## 2015-12-30 DIAGNOSIS — Z79899 Other long term (current) drug therapy: Secondary | ICD-10-CM

## 2015-12-30 DIAGNOSIS — D649 Anemia, unspecified: Secondary | ICD-10-CM

## 2015-12-30 LAB — BASIC METABOLIC PANEL
BUN: 34 mg/dL — ABNORMAL HIGH (ref 7–25)
CO2: 30 mmol/L (ref 20–31)
Calcium: 9.1 mg/dL (ref 8.6–10.4)
Chloride: 105 mmol/L (ref 98–110)
Creat: 1.02 mg/dL — ABNORMAL HIGH (ref 0.60–0.88)
Glucose, Bld: 81 mg/dL (ref 65–99)
Potassium: 4.1 mmol/L (ref 3.5–5.3)
Sodium: 143 mmol/L (ref 135–146)

## 2015-12-30 LAB — CBC WITH DIFFERENTIAL/PLATELET
Basophils Absolute: 65 cells/uL (ref 0–200)
Basophils Relative: 1 %
Eosinophils Absolute: 260 cells/uL (ref 15–500)
Eosinophils Relative: 4 %
HCT: 38.8 % (ref 35.0–45.0)
Hemoglobin: 13.1 g/dL (ref 11.7–15.5)
Lymphocytes Relative: 24 %
Lymphs Abs: 1560 cells/uL (ref 850–3900)
MCH: 31.7 pg (ref 27.0–33.0)
MCHC: 33.8 g/dL (ref 32.0–36.0)
MCV: 93.9 fL (ref 80.0–100.0)
MPV: 10.1 fL (ref 7.5–12.5)
Monocytes Absolute: 585 cells/uL (ref 200–950)
Monocytes Relative: 9 %
Neutro Abs: 4030 cells/uL (ref 1500–7800)
Neutrophils Relative %: 62 %
Platelets: 232 10*3/uL (ref 140–400)
RBC: 4.13 MIL/uL (ref 3.80–5.10)
RDW: 14.2 % (ref 11.0–15.0)
WBC: 6.5 10*3/uL (ref 3.8–10.8)

## 2016-01-01 ENCOUNTER — Telehealth: Payer: Self-pay | Admitting: *Deleted

## 2016-01-01 NOTE — Telephone Encounter (Signed)
Spoke with pt, she was confused re: Torsemide.  11-13-15 it was increased to 40 mg a.m and 20 mg 1 p.m. This was never changed back by provider, but pt thought it was on 11-28-15 o/v, so she did cut it back herself.  Pt advised that she still has some swelling so pt was advised to take 1/2 tab 40 mg torsemide today (as previously instructed) and I will speak with Cecilie Kicks tomorrow, 01/02/16 and call pt back.  Pt agreeable with this plan and verbalized understanding.

## 2016-01-01 NOTE — Telephone Encounter (Signed)
WANTED TO KNOW WHAT SHE SHOULD DO ABOUT HER TORSEMIDE, PLEASE CALL & ADVISE HER THANKS

## 2016-01-01 NOTE — Telephone Encounter (Signed)
Her kidneys are stressed, so if she is on 40 mg daily she needs to decrease to 20 mg daily.  But she complained of edema so she may take the 40 mg like she is for 4 days then decrease.  Watch her salt.  She may have to tolerates some lower ext edema to protect her kidneys.  If her wt climbs or increase in SOB then go back to 40 mg PRN for no more than 4 days.

## 2016-01-02 ENCOUNTER — Ambulatory Visit: Payer: Medicare Other | Admitting: Cardiology

## 2016-01-02 NOTE — Telephone Encounter (Signed)
Returned pts call re: torsemide.  [email or web address]

## 2016-03-31 NOTE — Progress Notes (Deleted)
Cardiology Office Note   Date:  03/31/2016   ID:  [name], DOB 11-23-1922, MRN [id number]  PCP:  Aretta Nip, MD  Cardiologist:  Dr. Johnsie Cancel    No chief complaint on file.     History of Present Illness: [name] is a 83 y.o. female who presents for edema follow up.  Pt had increased her demadex but was still gaining wt. I saw her and we adjusted her demadex for several days and now she is on 40 mg daily.   She changed her bed- they bought a hospital bed and her shoulder is much improved.  Her wt is about the same but edema has improved.  She is eating well and that may be cause of wt. Gain.  Additionally her H/H is normal we decreased her Iron due to her constipation and she would like to stop it today.    She has a history of R Breast CA s/p lumpectomy/XRT (2002), chronic atrial fibrillation on Coumadin, HTN and LE edema and GI bleed. Last admit to hospital she had 2 sec. Pauses and her BB was stopped.    She has no complaints of dizziness. occ bright blood with stool, she believes is her hemorrhoids. No chest pain and no SOB.    Past Medical History:  Diagnosis Date  . Atrial fibrillation ([place])   . CARCINOMA, BREAST   . CHEST PAIN   . DEGENERATIVE JOINT DISEASE   . Edema   . HYPERTENSION   . Long term (current) use of anticoagulants   . Unspecified diastolic heart failure [facility])     Past Surgical History:  Procedure Laterality Date  . BREAST LUMPECTOMY    . ESOPHAGOGASTRODUODENOSCOPY (EGD) WITH PROPOFOL Left 05/28/2015   Procedure: ESOPHAGOGASTRODUODENOSCOPY (EGD) WITH PROPOFOL;  Surgeon: Wilford Corner, MD;  Location: [facility] ENDOSCOPY;  Service: Endoscopy;  Laterality: Left;  . HIP SURGERY    . KNEE SURGERY    . LAMINECTOMY    . TONSILLECTOMY       Current Outpatient Prescriptions  Medication Sig Dispense Refill  . calcium-vitamin D (OSCAL WITH D) 500-200 MG-UNIT per tablet Take 1 tablet by mouth daily.    . cholecalciferol (VITAMIN D) 1000  UNITS tablet Take 1,000 Units by mouth daily.    . ferrous sulfate 325 (65 FE) MG tablet Take 1 tablet (325 mg total) by mouth daily with breakfast.    . HYDROcodone-acetaminophen (NORCO/VICODIN) 5-325 MG tablet Take 1 tablet by mouth every 6 (six) hours as needed for moderate pain or severe pain.    . Multiple Vitamins-Minerals (CENTRUM PO) Take 1 tablet by mouth daily.    . Omega-3 Fatty Acids (FISH OIL PO) Take 1 tablet by mouth daily.    . polyethylene glycol (MIRALAX / GLYCOLAX) packet Take 17 g by mouth daily. CONSTIPATION    . potassium chloride SA (K-DUR,KLOR-CON) 20 MEQ tablet TAKE ONE TABLET BY MOUTH TWICE DAILY 180 tablet 3  . senna (SENOKOT) 8.6 MG TABS tablet Take 2 tablets (17.2 mg total) by mouth at bedtime.    . torsemide (DEMADEX) 20 MG tablet Take 2 tablets (40 mg total) by mouth daily. 180 tablet 3  . vitamin C (ASCORBIC ACID) 500 MG tablet Take 500 mg by mouth daily.     No current facility-administered medications for this visit.     Allergies:   Morphine and related; Sulfa antibiotics; Meperidine; Meperidine hcl; Nsaids; and Oxycodone-acetaminophen    Social History:  The patient  reports that  she has never smoked. She has never used smokeless tobacco. She reports that she does not drink alcohol or use drugs.   Family History:  The patient's family history includes CAD in her father and mother; Heart attack in her brother and mother; Hypertension in her mother; Stroke in her brother.    ROS:  General:no colds or fevers, no weight changes Skin:no rashes or ulcers HEENT:no blurred vision, no congestion CV:see HPI PUL:see HPI GI:no diarrhea  +constipation or melena, no indigestion GU:no hematuria, no dysuria MS:no joint pain, no claudication Neuro:no syncope, no lightheadedness Endo:no diabetes, no thyroid disease  Wt Readings from Last 3 Encounters:  11/28/15 192 lb (87.1 kg)  11/13/15 192 lb 12.8 oz (87.5 kg)  09/02/15 183 lb 1.9 oz (83.1 kg)     PHYSICAL  EXAM: VS:  There were no vitals taken for this visit. , BMI There is no height or weight on file to calculate BMI. General:Pleasant affect, NAD Skin:Warm and dry, brisk capillary refill HEENT:normocephalic, sclera clear, mucus membranes moist Neck:supple, no JVD, no bruits  Heart:S1S2 RRR without murmur, gallup, rub or click Lungs:clear without rales, rhonchi, or wheezes [id number], non tender, + BS, do not palpate liver spleen or masses Ext:1+ lower ext edema, 2+ pedal pulses, 2+ radial pulses Neuro:alert and oriented X 3, MAE, follows commands, + facial symmetry    EKG:  EKG is NOT ordered today.    Recent Labs: 05/24/2015: ALT 16 11/13/2015: Brain Natriuretic Peptide 110.3; TSH 4.32 12/30/2015: BUN 34; Creat 1.02; Hemoglobin 13.1; Platelets 232; Potassium 4.1; Sodium 143    Lipid Panel    Component Value Date/Time   CHOL 89 05/25/2015 0525   TRIG 195 (H) 05/25/2015 0525   HDL 21 (L) 05/25/2015 0525   CHOLHDL 4.2 05/25/2015 0525   VLDL 39 05/25/2015 0525   LDLCALC 29 05/25/2015 0525   LDLDIRECT 139.5 10/25/2008 0958       Other studies Reviewed: Additional studies/ records that were reviewed today include: . 2D ECHO: 10/30/13 LV EF: 60% -  65% Study Conclusions - Left ventricle: The cavity size was normal. Wall thickness was increased in a pattern of mild LVH. Systolic function was normal. The estimated ejection fraction was in the range of 60% to 65%. Wall motion was normal; there were no regional wall motion abnormalities. - Aortic valve: There was trivial regurgitation. - Left atrium: The atrium was severely dilated  ASSESSMENT AND PLAN:  Chronic atrial fibrillation: Metoprolol tart 25mg  BID recently discontinued during recent admission for 2 second pauses. Rate currently well controlled off all AV nodal blocking agents. CHASDSVASC at least 5. Coumadin stopped In April due to GI bleed , age and falls  Chronic LE edema: stable. Continue home Torsemide.   At 40 mg in am. Lab Results  Component Value Date   CREATININE 1.02 (H) 12/30/2015   BUN 34 (H) 12/30/2015   NA 143 12/30/2015   K 4.1 12/30/2015   CL 105 12/30/2015   CO2 30 12/30/2015     HTN: Blood pressure normal on no antihypertensives.  Upper GIB (gastrointestinal bleeding): GI consulted and underwent EGD on 3/28 showed clean-based antral ulcers likely secondary to NSAIDs. Recommendations are to continue with PPI twice a day for 3 months, and then daily. anticoagulation was not resumed.  HGB back to normal,  Off iron due to constipation  Lab Results  Component Value Date   HCT 38.8 12/30/2015    Jenkins Rouge

## 2016-04-08 ENCOUNTER — Ambulatory Visit: Payer: Medicare Other | Admitting: Cardiovascular Disease

## 2016-05-31 NOTE — Progress Notes (Deleted)
Cardiology Office Note   Date:  05/31/2016   ID:  [name], DOB Sep 19, 1922, MRN [id number]  PCP:  Aretta Nip, MD  Cardiologist:  Dr. Johnsie Cancel    No chief complaint on file.     History of Present Illness: [name] is a 82 y.o. female who presents for edema follow up.  Tends to be best with 40 mg demedex but sometimes Cr elevates    She has a history of R Breast CA s/p lumpectomy/XRT (2002), chronic atrial fibrillation on Coumadin, HTN and LE edema and GI bleed. Last admit to hospital she had 2 sec. Pauses and her BB was stopped.    She has no complaints of dizziness. occ bright blood with stool, she believes is her hemorrhoids. No chest pain and no SOB.    Past Medical History:  Diagnosis Date  . Atrial fibrillation ([place])   . CARCINOMA, BREAST   . CHEST PAIN   . DEGENERATIVE JOINT DISEASE   . Edema   . HYPERTENSION   . Long term (current) use of anticoagulants   . Unspecified diastolic heart failure [facility] [address])     Past Surgical History:  Procedure Laterality Date  . BREAST LUMPECTOMY    . ESOPHAGOGASTRODUODENOSCOPY (EGD) WITH PROPOFOL Left 05/28/2015   Procedure: ESOPHAGOGASTRODUODENOSCOPY (EGD) WITH PROPOFOL;  Surgeon: Wilford Corner, MD;  Location: [facility] ENDOSCOPY;  Service: Endoscopy;  Laterality: Left;  . HIP SURGERY    . KNEE SURGERY    . LAMINECTOMY    . TONSILLECTOMY       Current Outpatient Prescriptions  Medication Sig Dispense Refill  . calcium-vitamin D (OSCAL WITH D) 500-200 MG-UNIT per tablet Take 1 tablet by mouth daily.    . cholecalciferol (VITAMIN D) 1000 UNITS tablet Take 1,000 Units by mouth daily.    . ferrous sulfate 325 (65 FE) MG tablet Take 1 tablet (325 mg total) by mouth daily with breakfast.    . HYDROcodone-acetaminophen (NORCO/VICODIN) 5-325 MG tablet Take 1 tablet by mouth every 6 (six) hours as needed for moderate pain or severe pain.    . Multiple Vitamins-Minerals (CENTRUM PO) Take 1 tablet by mouth daily.    .  Omega-3 Fatty Acids (FISH OIL PO) Take 1 tablet by mouth daily.    . polyethylene glycol (MIRALAX / GLYCOLAX) packet Take 17 g by mouth daily. CONSTIPATION    . potassium chloride SA (K-DUR,KLOR-CON) 20 MEQ tablet TAKE ONE TABLET BY MOUTH TWICE DAILY 180 tablet 3  . senna (SENOKOT) 8.6 MG TABS tablet Take 2 tablets (17.2 mg total) by mouth at bedtime.    . torsemide (DEMADEX) 20 MG tablet Take 2 tablets (40 mg total) by mouth daily. 180 tablet 3  . vitamin C (ASCORBIC ACID) 500 MG tablet Take 500 mg by mouth daily.     No current facility-administered medications for this visit.     Allergies:   Morphine and related; Sulfa antibiotics; Meperidine; Meperidine hcl; Nsaids; and Oxycodone-acetaminophen    Social History:  The patient  reports that she has never smoked. She has never used smokeless tobacco. She reports that she does not drink alcohol or use drugs.   Family History:  The patient's family history includes CAD in her father and mother; Heart attack in her brother and mother; Hypertension in her mother; Stroke in her brother.    ROS:  General:no colds or fevers, no weight changes Skin:no rashes or ulcers HEENT:no blurred vision, no congestion CV:see HPI PUL:see HPI GI:no diarrhea  +  constipation or melena, no indigestion GU:no hematuria, no dysuria MS:no joint pain, no claudication Neuro:no syncope, no lightheadedness Endo:no diabetes, no thyroid disease  Wt Readings from Last 3 Encounters:  11/28/15 192 lb (87.1 kg)  11/13/15 192 lb 12.8 oz (87.5 kg)  09/02/15 183 lb 1.9 oz (83.1 kg)     PHYSICAL EXAM: VS:  There were no vitals taken for this visit. , BMI There is no height or weight on file to calculate BMI. General:Pleasant affect, NAD Skin:Warm and dry, brisk capillary refill HEENT:normocephalic, sclera clear, mucus membranes moist Neck:supple, no JVD, no bruits  Heart:S1S2 RRR without murmur, gallup, rub or click Lungs:clear without rales, rhonchi, or  wheezes FTD:DUKG, non tender, + BS, do not palpate liver spleen or masses Ext:1+ lower ext edema, 2+ pedal pulses, 2+ radial pulses Neuro:alert and oriented X 3, MAE, follows commands, + facial symmetry    EKG:  11/13/15 afib rates 71 lateral T wave changes     Recent Labs: 11/13/2015: Brain Natriuretic Peptide 110.3; TSH 4.32 12/30/2015: BUN 34; Creat 1.02; Hemoglobin 13.1; Platelets 232; Potassium 4.1; Sodium 143    Lipid Panel    Component Value Date/Time   CHOL 89 05/25/2015 0525   TRIG 195 (H) 05/25/2015 0525   HDL 21 (L) 05/25/2015 0525   CHOLHDL 4.2 05/25/2015 0525   VLDL 39 05/25/2015 0525   LDLCALC 29 05/25/2015 0525   LDLDIRECT 139.5 10/25/2008 0958       Other studies Reviewed: Additional studies/ records that were reviewed today include: . 2D ECHO: 10/30/13 LV EF: 60% -  65% Study Conclusions - Left ventricle: The cavity size was normal. Wall thickness was increased in a pattern of mild LVH. Systolic function was normal. The estimated ejection fraction was in the range of 60% to 65%. Wall motion was normal; there were no regional wall motion abnormalities. - Aortic valve: There was trivial regurgitation. - Left atrium: The atrium was severely dilated  ASSESSMENT AND PLAN:  Chronic atrial fibrillation:  CHASDSVASC at least 5. Coumadin stopped In April due to GI bleed , age and falls Has some AV node disease and not on beta blocker at this time as well  Chronic LE edema: stable. Continue home Torsemide.  At 40 mg in am.  HTN: Blood pressure normal on no antihypertensives.  Upper GIB (gastrointestinal bleeding): GI consulted and underwent EGD on 3/28 showed clean-based antral ulcers likely secondary to NSAIDs. Recommendations are to continue with PPI twice a day for 3 months, and then daily. anticoagulation was not resumed.  HGB back to normal     Jenkins Rouge, MD

## 2016-06-02 ENCOUNTER — Ambulatory Visit: Payer: Medicare Other | Admitting: Cardiovascular Disease

## 2016-06-22 NOTE — Progress Notes (Signed)
Cardiology Office Note   Date:  06/25/2016   ID:  DESTA BUJAK, DOB [birth year], MRN [id number]  PCP:  Aretta Nip, MD  Cardiologist:  Dr. Johnsie Cancel    Chief Complaint  Patient presents with  . Atrial Fibrillation  . Medication Management      History of Present Illness: [name] is a 83 y.o. female who presents for f/u PAF , edema and GI bleed   Tends to be best with 40 mg demedex but sometimes Cr elevates    She has a history of R Breast CA s/p lumpectomy/XRT (2002), chronic atrial fibrillation  HTN and LE edema and GI bleed. Last admit to hospital she had 2 sec. Pauses and her BB was stopped.    She has no complaints of dizziness. occ bright blood with stool, she believes is her hemorrhoids. No chest pain and no SOB.  Daughter notes foul urine smell No fever or chills Getting cataracts done in the next few weeks  Past Medical History:  Diagnosis Date  . Atrial fibrillation ([place])   . CARCINOMA, BREAST   . CHEST PAIN   . DEGENERATIVE JOINT DISEASE   . Edema   . HYPERTENSION   . Long term (current) use of anticoagulants   . Unspecified diastolic heart failure     Past Surgical History:  Procedure Laterality Date  . BREAST LUMPECTOMY    . ESOPHAGOGASTRODUODENOSCOPY (EGD) WITH PROPOFOL Left 05/28/2015   Procedure: ESOPHAGOGASTRODUODENOSCOPY (EGD) WITH PROPOFOL;  Surgeon: Wilford Corner, MD;  Location: [facility] ENDOSCOPY;  Service: Endoscopy;  Laterality: Left;  . HIP SURGERY    . KNEE SURGERY    . LAMINECTOMY    . TONSILLECTOMY       Current Outpatient Prescriptions  Medication Sig Dispense Refill  . calcium-vitamin D (OSCAL WITH D) 500-200 MG-UNIT per tablet Take 1 tablet by mouth daily.    . cholecalciferol (VITAMIN D) 1000 UNITS tablet Take 1,000 Units by mouth daily.    Marland Kitchen HYDROcodone-acetaminophen (NORCO/VICODIN) 5-325 MG tablet Take 1 tablet by mouth every 6 (six) hours as needed for moderate pain or severe pain.    . Multiple Vitamins-Minerals  (CENTRUM PO) Take 1 tablet by mouth daily.    . Omega-3 Fatty Acids (FISH OIL PO) Take 1 tablet by mouth daily.    . polyethylene glycol (MIRALAX / GLYCOLAX) packet Take 17 g by mouth daily. CONSTIPATION    . potassium chloride SA (K-DUR,KLOR-CON) 20 MEQ tablet TAKE ONE TABLET BY MOUTH TWICE DAILY 180 tablet 3  . senna (SENOKOT) 8.6 MG TABS tablet Take 2 tablets (17.2 mg total) by mouth at bedtime.    . torsemide (DEMADEX) 20 MG tablet Take 2 tablets (40 mg total) by mouth daily. 180 tablet 3  . vitamin C (ASCORBIC ACID) 500 MG tablet Take 500 mg by mouth daily.     No current facility-administered medications for this visit.     Allergies:   Morphine and related; Sulfa antibiotics; Meperidine; Meperidine hcl; Nsaids; Oxycodone-acetaminophen; and Tolmetin    Social History:  The patient  reports that she has never smoked. She has never used smokeless tobacco. She reports that she does not drink alcohol or use drugs.   Family History:  The patient's family history includes CAD in her father and mother; Heart attack in her brother and mother; Hypertension in her mother; Stroke in her brother.    ROS:  General:no colds or fevers, no weight changes Skin:no rashes or ulcers HEENT:no blurred vision, no  congestion CV:see HPI PUL:see HPI GI:no diarrhea  +constipation or melena, no indigestion GU:no hematuria, no dysuria MS:no joint pain, no claudication Neuro:no syncope, no lightheadedness Endo:no diabetes, no thyroid disease  Wt Readings from Last 3 Encounters:  06/25/16 212 lb (96.2 kg)  11/28/15 192 lb (87.1 kg)  11/13/15 192 lb 12.8 oz (87.5 kg)     PHYSICAL EXAM: VS:  BP 122/70   Pulse 67   Ht 5\' 8"  (1.727 m)   Wt 212 lb (96.2 kg)   SpO2 96%   BMI 32.23 kg/m  , BMI Body mass index is 32.23 kg/m. General:Pleasant affect, NAD Skin:Warm and dry, brisk capillary refill HEENT:normocephalic, sclera clear, mucus membranes moist Neck:supple, no JVD, no bruits  Heart:S1S2 RRR  without murmur, gallup, rub or click Lungs:clear without rales, rhonchi, or wheezes CZY:SAYT, non tender, + BS, do not palpate liver spleen or masses Ext:1+ lower ext edema, 2+ pedal pulses, 2+ radial pulses Neuro:alert and oriented X 3, MAE, follows commands, + facial symmetry    EKG:  11/13/15 afib rates 71 lateral T wave changes     Recent Labs: 11/13/2015: Brain Natriuretic Peptide 110.3; TSH 4.32 12/30/2015: BUN 34; Creat 1.02; Hemoglobin 13.1; Platelets 232; Potassium 4.1; Sodium 143    Lipid Panel    Component Value Date/Time   CHOL 89 05/25/2015 0525   TRIG 195 (H) 05/25/2015 0525   HDL 21 (L) 05/25/2015 0525   CHOLHDL 4.2 05/25/2015 0525   VLDL 39 05/25/2015 0525   LDLCALC 29 05/25/2015 0525   LDLDIRECT 139.5 10/25/2008 0958       Other studies Reviewed: Additional studies/ records that were reviewed today include: . 2D ECHO: 10/30/13 LV EF: 60% -  65% Study Conclusions - Left ventricle: The cavity size was normal. Wall thickness was increased in a pattern of mild LVH. Systolic function was normal. The estimated ejection fraction was in the range of 60% to 65%. Wall motion was normal; there were no regional wall motion abnormalities. - Aortic valve: There was trivial regurgitation. - Left atrium: The atrium was severely dilated  ASSESSMENT AND PLAN:  Chronic atrial fibrillation:  CHASDSVASC at least 5. Coumadin stopped In April 2017  due to GI bleed , age and falls Has some AV node disease and not on beta blocker at this time as well  Chronic LE edema: stable. Continue home Torsemide.  At 40 mg in am.  HTN: Blood pressure normal on no antihypertensives.  Upper GIB (gastrointestinal bleeding): GI consulted and underwent EGD on 3/28 showed clean-based antral ulcers likely secondary to NSAIDs. Recommendations are to continue with PPI twice a day for 3 months, and then daily. anticoagulation was not resumed.  HGB back to normal    UTI:   Encouraged daughter to get UA at primary care office   Cataracts:  Clear to have surgery   Jenkins Rouge, MD

## 2016-06-25 ENCOUNTER — Ambulatory Visit (INDEPENDENT_AMBULATORY_CARE_PROVIDER_SITE_OTHER): Payer: Medicare Other | Admitting: Cardiovascular Disease

## 2016-06-25 ENCOUNTER — Encounter (INDEPENDENT_AMBULATORY_CARE_PROVIDER_SITE_OTHER): Payer: Self-pay

## 2016-06-25 ENCOUNTER — Encounter: Payer: Self-pay | Admitting: Cardiovascular Disease

## 2016-06-25 VITALS — BP 122/70 | HR 67 | Ht 68.0 in | Wt 212.0 lb

## 2016-06-25 DIAGNOSIS — Z5181 Encounter for therapeutic drug level monitoring: Secondary | ICD-10-CM | POA: Diagnosis not present

## 2016-06-25 DIAGNOSIS — I482 Chronic atrial fibrillation, unspecified: Secondary | ICD-10-CM

## 2016-06-25 NOTE — Patient Instructions (Signed)

## 2016-08-15 ENCOUNTER — Other Ambulatory Visit: Payer: Self-pay | Admitting: Cardiovascular Disease

## 2016-11-10 ENCOUNTER — Other Ambulatory Visit: Payer: Self-pay | Admitting: Cardiology

## 2017-05-17 ENCOUNTER — Other Ambulatory Visit: Payer: Self-pay | Admitting: Cardiovascular Disease

## 2017-06-01 ENCOUNTER — Other Ambulatory Visit: Payer: Self-pay | Admitting: Cardiovascular Disease

## 2017-06-01 MED ORDER — POTASSIUM CHLORIDE CRYS ER 20 MEQ PO TBCR: 20.0000 meq | EXTENDED_RELEASE_TABLET | Freq: Two times a day (BID) | ORAL | 0 refills | Status: DC

## 2017-08-04 ENCOUNTER — Other Ambulatory Visit: Payer: Self-pay | Admitting: Cardiovascular Disease

## 2017-08-05 NOTE — Addendum Note (Signed)
Addended by: Derl Barrow on: 08/05/2017 09:51 AM   Modules accepted: Orders

## 2017-08-13 ENCOUNTER — Ambulatory Visit (INDEPENDENT_AMBULATORY_CARE_PROVIDER_SITE_OTHER): Payer: Medicare Other

## 2017-08-13 ENCOUNTER — Encounter: Payer: Self-pay | Admitting: Sports Medicine

## 2017-08-13 ENCOUNTER — Ambulatory Visit (INDEPENDENT_AMBULATORY_CARE_PROVIDER_SITE_OTHER): Payer: Medicare Other | Admitting: Sports Medicine

## 2017-08-13 DIAGNOSIS — M25462 Effusion, left knee: Secondary | ICD-10-CM | POA: Diagnosis not present

## 2017-08-13 DIAGNOSIS — M1712 Unilateral primary osteoarthritis, left knee: Secondary | ICD-10-CM | POA: Diagnosis not present

## 2017-08-13 NOTE — Progress Notes (Signed)
Subjective:    CC: Left leg pain  HPI:  [name] is a very pleasant 84 year old female, for many years she has had pain that she localizes in her left knee, anterolateral proximal tibia.  She is post bilateral hip and right total knee arthroplasties, doing well from that standpoint.  She has been given topical diclofenac, Tylenol which has not provided much relief, has never had an injection in her left knee.  She also has a history of an old fibular fracture with a deformity, on MRI it was mentioned that this could potentially cause a peroneal entrapment syndrome.  She does not really have any paresthesias running down the dorsal lateral aspect of her left leg or dorsum of her left foot.  She has done some home health physical therapy in the past but did not really get much benefit, it sounds as though she had too much pain to meaningfully participate.  I reviewed the past medical history, family history, social history, surgical history, and allergies today and no changes were needed.  Please see the problem list section below in epic for further details.  Past Medical History: Past Medical History:  Diagnosis Date  . Atrial fibrillation ([place])   . CARCINOMA, BREAST   . CHEST PAIN   . DEGENERATIVE JOINT DISEASE   . Edema   . HYPERTENSION   . Long term (current) use of anticoagulants   . Unspecified diastolic heart failure    Past Surgical History: Past Surgical History:  Procedure Laterality Date  . BREAST LUMPECTOMY    . ESOPHAGOGASTRODUODENOSCOPY (EGD) WITH PROPOFOL Left 05/28/2015   Procedure: ESOPHAGOGASTRODUODENOSCOPY (EGD) WITH PROPOFOL;  Surgeon: Wilford Corner, MD;  Location: [facility] ENDOSCOPY;  Service: Endoscopy;  Laterality: Left;  . HIP SURGERY    . KNEE SURGERY    . LAMINECTOMY    . TONSILLECTOMY     Social History: Social History   Socioeconomic History  . Marital status: Single    Spouse name: Not on file  . Number of children: Not on file  . Years of education: Not  on file  . Highest education level: Not on file  Occupational History  . Not on file  Social Needs  . Financial resource strain: Not on file  . Food insecurity:    Worry: Not on file    Inability: Not on file  . Transportation needs:    Medical: Not on file    Non-medical: Not on file  Tobacco Use  . Smoking status: Never Smoker  . Smokeless tobacco: Never Used  Substance and Sexual Activity  . Alcohol use: No  . Drug use: No  . Sexual activity: Not on file  Lifestyle  . Physical activity:    Days per week: Not on file    Minutes per session: Not on file  . Stress: Not on file  Relationships  . Social connections:    Talks on phone: Not on file    Gets together: Not on file    Attends religious service: Not on file    Active member of club or organization: Not on file    Attends meetings of clubs or organizations: Not on file    Relationship status: Not on file  Other Topics Concern  . Not on file  Social History Narrative  . Not on file   Family History: Family History  Problem Relation Age of Onset  . CAD Mother   . Heart attack Mother   . Hypertension Mother   . CAD  Father   . Stroke Brother   . Heart attack Brother    Allergies: Allergies  Allergen Reactions  . Morphine And Related Other (See Comments)    Other Reaction: mild Intolerance, Allergy  . Sulfa Antibiotics     Other reaction(s): Other (See Comments) Other Reaction: mild Intolerance, Allergy  . Meperidine Nausea And Vomiting and Other (See Comments)  . Meperidine Hcl Nausea Only  . Nsaids     Other reaction(s): Other (See Comments) Bleeding ulcer  . Oxycodone-Acetaminophen Nausea Only  . Tolmetin Other (See Comments)    Other reaction(s): Other (See Comments) Bleeding ulcer   Medications: See med rec.  Review of Systems: No headache, visual changes, nausea, vomiting, diarrhea, constipation, dizziness, abdominal pain, skin rash, fevers, chills, night sweats, swollen lymph nodes, weight  loss, chest pain, body aches, joint swelling, muscle aches, shortness of breath, mood changes, visual or auditory hallucinations.  Objective:    General: Well Developed, well nourished, and in no acute distress.  Neuro: Alert and oriented x3, extra-ocular muscles intact, sensation grossly intact.  HEENT: Normocephalic, atraumatic, pupils equal round reactive to light, neck supple, no masses, no lymphadenopathy, thyroid nonpalpable.  Skin: Warm and dry, no rashes noted.  Cardiac: Regular rate and rhythm, no murmurs rubs or gallops.  Respiratory: Clear to auscultation bilaterally. Not using accessory muscles, speaking in full sentences.  Abdominal: Soft, nontender, nondistended, positive bowel sounds, no masses, no organomegaly.  Left knee: Valgus deformity, tenderness at the lateral joint line, only minimal tenderness with fullness over the proximal anterolateral tibia. ROM normal in flexion and extension and lower leg rotation. Ligaments with solid consistent endpoints including ACL, PCL, LCL, MCL. Negative Mcmurray's and provocative meniscal tests. Non painful patellar compression. Patellar and quadriceps tendons unremarkable. Hamstring and quadriceps strength is normal.  Procedure: Real-time Ultrasound Guided Injection of left knee Device: GE Logiq E  Verbal informed consent obtained.  Time-out conducted.  Noted no overlying erythema, induration, or other signs of local infection.  Skin prepped in a sterile fashion.  Local anesthesia: Topical Ethyl chloride.  With sterile technique and under real time ultrasound guidance: 1 cc Kenalog 40, 2 cc lidocaine, 2 cc bupivacaine injected easily. Completed without difficulty  Pain immediately resolved suggesting accurate placement of the medication.  Advised to call if fevers/chills, erythema, induration, drainage, or persistent bleeding.  Images permanently stored and available for review in the ultrasound unit.  Impression: Technically  successful ultrasound guided injection.  Impression and Recommendations:    The patient was counselled, risk factors were discussed, anticipatory guidance given.  Primary osteoarthritis of left knee Anterolateral proximal tibial pain, pain also at the joint line, I think most of this is coming from her knee. Left knee was injected today, she is post bilateral hip and right knee arthroplasties. Pennsaid (diclofenac 2% topical) samples given. She does have a history of a proximal fibular fracture that may be causing some degree of peroneal entrapment, at the 1 month follow-up we can do a peroneal nerve hydrodissection if persistent pain. She did have near complete relief of all of her leg pain with ambulation today after the injection. Adding home health physical therapy. She certainly does have some degree of myofascial pain syndrome, certainly an SNRI would be an option in the future should she continue to have widespread pain. ___________________________________________ Gwen Her. Dianah Field, M.D., ABFM., CAQSM. Primary Care and [place] Instructor of [employer] [facility] [employer]

## 2017-08-13 NOTE — Assessment & Plan Note (Addendum)
Anterolateral proximal tibial pain, pain also at the joint line, I think most of this is coming from her knee. Left knee was injected today, she is post bilateral hip and right knee arthroplasties. Pennsaid (diclofenac 2% topical) samples given. She does have a history of a proximal fibular fracture that may be causing some degree of peroneal entrapment, at the 1 month follow-up we can do a peroneal nerve hydrodissection if persistent pain. She did have near complete relief of all of her leg pain with ambulation today after the injection. Adding home health physical therapy. She certainly does have some degree of myofascial pain syndrome, certainly an SNRI would be an option in the future should she continue to have widespread pain.

## 2017-08-18 ENCOUNTER — Ambulatory Visit: Payer: Medicare Other | Admitting: Sports Medicine

## 2017-08-30 ENCOUNTER — Encounter: Payer: Self-pay | Admitting: Nurse Practitioner

## 2017-08-30 ENCOUNTER — Ambulatory Visit (INDEPENDENT_AMBULATORY_CARE_PROVIDER_SITE_OTHER): Payer: Medicare Other | Admitting: Nurse Practitioner

## 2017-08-30 ENCOUNTER — Encounter (INDEPENDENT_AMBULATORY_CARE_PROVIDER_SITE_OTHER): Payer: Self-pay

## 2017-08-30 VITALS — BP 140/78 | HR 57 | Ht 68.0 in

## 2017-08-30 DIAGNOSIS — I482 Chronic atrial fibrillation, unspecified: Secondary | ICD-10-CM

## 2017-08-30 MED ORDER — TORSEMIDE 20 MG PO TABS: 40.0000 mg | ORAL_TABLET | Freq: Every day | ORAL | 3 refills | Status: DC

## 2017-08-30 NOTE — Patient Instructions (Addendum)
We will be checking the following labs today - BMET, CBC   Medication Instructions:    Continue with your current medicines.   Let's stay on the Torsemide - total of 40 mg a day     Testing/Procedures To Be Arranged:  N/A  Follow-Up:   See Dr. Johnsie Cancel in 9 months    Other Special Instructions:   N/A    If you need a refill on your cardiac medications before your next appointment, please call your pharmacy.   Call the [place] office at [phone] if you have any questions, problems or concerns.

## 2017-08-30 NOTE — Progress Notes (Signed)
CARDIOLOGY OFFICE NOTE   Date:  08/30/2017    [name] Date of Birth: 1922-12-23 Medical Record #[id number]  PCP:  Aretta Nip, MD  Cardiologist:  Johnsie Cancel  Chief Complaint  Patient presents with  . Atrial Fibrillation    Follow up visit - seen for Dr. Johnsie Cancel    History of Present Illness: [name] is a 84 y.o. female who presents today for a follow up visit. Seen for Dr. Johnsie Cancel.   She has a history of permanent AF, chronic edema and prior GI bleed. She has had prior breast cancer treated with R lumpectomy and XRT in 2002. She is no longer on beta blocker due to pauses.   Last seen in April of 2018.   Comes in today. Here with her caregiver. Lots of concerns. Has not been back as recommended due to "flu season". Dose of her Torsemide is quite unclear. Sounds like sometimes she takes total of 60 mg and sometimes she takes 40 mg. No lab since October of 2017. Does walk to the bathroom but pretty sedentary as a general rule. No chest pain. Breathing is ok per her report. No falls. Probably gets too much salt as a general rule.   Past Medical History:  Diagnosis Date  . Atrial fibrillation ([place])   . CARCINOMA, BREAST   . CHEST PAIN   . DEGENERATIVE JOINT DISEASE   . Edema   . HYPERTENSION   . Long term (current) use of anticoagulants   . Unspecified diastolic heart failure     Past Surgical History:  Procedure Laterality Date  . BREAST LUMPECTOMY    . ESOPHAGOGASTRODUODENOSCOPY (EGD) WITH PROPOFOL Left 05/28/2015   Procedure: ESOPHAGOGASTRODUODENOSCOPY (EGD) WITH PROPOFOL;  Surgeon: Wilford Corner, MD;  Location: [facility] ENDOSCOPY;  Service: Endoscopy;  Laterality: Left;  . HIP SURGERY    . KNEE SURGERY    . LAMINECTOMY    . TONSILLECTOMY       Medications: Current Meds  Medication Sig  . calcium-vitamin D (OSCAL WITH D) 500-200 MG-UNIT per tablet Take 1 tablet by mouth daily.  . cholecalciferol (VITAMIN D) 1000 UNITS tablet Take 1,000 Units  by mouth daily.  . diclofenac sodium (VOLTAREN) 1 % GEL Apply 4 g topically 2 (two) times daily.  Marland Kitchen HYDROcodone-acetaminophen (NORCO/VICODIN) 5-325 MG tablet Take 1 tablet by mouth every 6 (six) hours as needed for moderate pain or severe pain.  . Multiple Vitamins-Minerals (CENTRUM PO) Take 1 tablet by mouth daily.  . Omega-3 Fatty Acids (FISH OIL PO) Take 1 tablet by mouth daily.  . polyethylene glycol (MIRALAX / GLYCOLAX) packet Take 17 g by mouth daily. CONSTIPATION  . potassium chloride SA (KLOR-CON M20) 20 MEQ tablet Take 1 tablet (20 mEq total) by mouth 2 (two) times daily. Please keep upcoming appt in July for future refills. Thank you  . senna (SENOKOT) 8.6 MG TABS tablet Take 2 tablets (17.2 mg total) by mouth at bedtime.  . torsemide (DEMADEX) 20 MG tablet TAKE 2 TABLETS (40 MG TOTAL) BY MOUTH DAILY. (Patient taking differently: Take 40 mg by mouth daily. 40 mg in the am 20 mg at 12:00 pm)  . vitamin C (ASCORBIC ACID) 500 MG tablet Take 500 mg by mouth daily.     Allergies: Allergies  Allergen Reactions  . Morphine And Related Other (See Comments)    Other Reaction: mild Intolerance, Allergy  . Sulfa Antibiotics     Other reaction(s): Other (See Comments) Other Reaction: mild  Intolerance, Allergy  . Meperidine Nausea And Vomiting and Other (See Comments)  . Meperidine Hcl Nausea Only  . Nsaids     Other reaction(s): Other (See Comments) Bleeding ulcer  . Oxycodone-Acetaminophen Nausea Only  . Tolmetin Other (See Comments)    Other reaction(s): Other (See Comments) Bleeding ulcer    Social History: The patient  reports that she has never smoked. She has never used smokeless tobacco. She reports that she does not drink alcohol or use drugs.   Family History: The patient's family history includes CAD in her father and mother; Heart attack in her brother and mother; Hypertension in her mother; Stroke in her brother.   Review of Systems: Please see the history of present  illness.   Otherwise, the review of systems is positive for none.   All other systems are reviewed and negative.   Physical Exam: VS:  BP 140/78 (BP Location: Left Arm, Patient Position: Sitting, Cuff Size: Large)   Pulse (!) 57   Ht 5\' 8"  (1.727 m)   BMI 32.99 kg/m  .  BMI Body mass index is 32.99 kg/m.  Wt Readings from Last 3 Encounters:  08/13/17 217 lb (98.4 kg)  06/25/16 212 lb (96.2 kg)  11/28/15 192 lb (87.1 kg)    General: Pleasant. Elderly. Alert and in no acute distress. She is in a wheelchair.   HEENT: Normal.  Neck: Supple, no JVD, carotid bruits, or masses noted.  Cardiac: Irregular irregular rhythm. Rate is ok.  She has marked edema of the right leg - some on the left. She does not use support stockings.  Respiratory:  Lungs are clear to auscultation bilaterally with normal work of breathing.  GI: Soft and nontender.  MS: No deformity or atrophy. Gait not tested. She is not able to stand. Skin: Warm and dry. Color is normal.  Neuro:  Strength and sensation are intact and no gross focal deficits noted.  Psych: Alert, appropriate and with normal affect.   LABORATORY DATA:  EKG:  EKG is ordered today. This demonstrates AF with VR of 57.  Lab Results  Component Value Date   WBC 6.5 12/30/2015   HGB 13.1 12/30/2015   HCT 38.8 12/30/2015   PLT 232 12/30/2015   GLUCOSE 81 12/30/2015   CHOL 89 05/25/2015   TRIG 195 (H) 05/25/2015   HDL 21 (L) 05/25/2015   LDLDIRECT 139.5 10/25/2008   LDLCALC 29 05/25/2015   ALT 16 05/24/2015   AST 26 05/24/2015   NA 143 12/30/2015   K 4.1 12/30/2015   CL 105 12/30/2015   CREATININE 1.02 (H) 12/30/2015   BUN 34 (H) 12/30/2015   CO2 30 12/30/2015   TSH 4.32 11/13/2015   INR 4.1 06/24/2015   HGBA1C 5.9 (H) 05/25/2015     BNP (last 3 results) No results for input(s): BNP in the last 8760 hours.  ProBNP (last 3 results) No results for input(s): PROBNP in the last 8760 hours.   Other Studies Reviewed Today:  Echo  Study Conclusions 2015  - Left ventricle: The cavity size was normal. Wall thickness was increased in a pattern of mild LVH. Systolic function was normal. The estimated ejection fraction was in the range of 60% to 65%. Wall motion was normal; there were no regional wall motion abnormalities. - Aortic valve: There was trivial regurgitation. - Left atrium: The atrium was severely dilated.  Assessment/Plan:  1. Pesistent atrial fibrillation:  CHASDSVASC at least 5. Coumadin stopped In April 2017 due to GI  bleed, age and history of falls. Her rate is currently ok. She has had AV nodal disease - no longer on beta blocker. She needs lab today.   2. Chronic LE edema: probably getting too much salt. Not able to use support stockings. Checking lab today. Would leave on 40 mg of Torsemide daily.   3. HTN: BP fair on her current regimen  4. Prior GI bleeding she has had prior EGD showing antral ulcers due to NSAIDs. She is no longer on anticoagulation and no longer felt to be a satisfactory candidate for anticoagulation.    Current medicines are reviewed with the patient today.  The patient does not have concerns regarding medicines other than what has been noted above.  The following changes have been made:  See above.  Labs/ tests ordered today include:   No orders of the defined types were placed in this encounter.    Disposition:   FU with Dr. Johnsie Cancel in 9 months (she does not wish to come during flu season).   Patient is agreeable to this plan and will call if any problems develop in the interim.   SignedTruitt Merle, NP  08/30/2017 4:12 PM  [facility] [address] [place], [place]  [address] Phone: [phone] Fax: [phone]

## 2017-08-31 ENCOUNTER — Telehealth: Payer: Self-pay | Admitting: Nurse Practitioner

## 2017-08-31 LAB — BASIC METABOLIC PANEL
BUN/Creatinine Ratio: 31 — ABNORMAL HIGH (ref 12–28)
BUN: 32 mg/dL (ref 10–36)
CO2: 25 mmol/L (ref 20–29)
Calcium: 9.6 mg/dL (ref 8.7–10.3)
Chloride: 104 mmol/L (ref 96–106)
Creatinine, Ser: 1.04 mg/dL — ABNORMAL HIGH (ref 0.57–1.00)
GFR calc Af Amer: 54 mL/min/{1.73_m2} — ABNORMAL LOW (ref >59)
GFR calc non Af Amer: 47 mL/min/{1.73_m2} — ABNORMAL LOW (ref >59)
Glucose: 90 mg/dL (ref 65–99)
Potassium: 4.3 mmol/L (ref 3.5–5.2)
Sodium: 143 mmol/L (ref 134–144)

## 2017-08-31 LAB — CBC
Hematocrit: 44.2 % (ref 34.0–46.6)
Hemoglobin: 14.1 g/dL (ref 11.1–15.9)
MCH: 30.6 pg (ref 26.6–33.0)
MCHC: 31.9 g/dL (ref 31.5–35.7)
MCV: 96 fL (ref 79–97)
Platelets: 238 10*3/uL (ref 150–450)
RBC: 4.61 x10E6/uL (ref 3.77–5.28)
RDW: 13.9 % (ref 12.3–15.4)
WBC: 7.8 10*3/uL (ref 3.4–10.8)

## 2017-08-31 NOTE — Telephone Encounter (Signed)
Pt c/o swelling: STAT is pt has developed SOB within 24 hours  1) How much weight have you gained and in what time span? [facility]  2) If swelling, where is the swelling located? legs  3) Are you currently taking a fluid pill?Torsemide  4) Are you currently SOB? no  5) Do you have a log of your daily weights (if so, list)? She dies  6) Have you gained 3 pounds in a day or 5 pounds in a week? 3 pounds in a day 7) Have you traveled recently? no :

## 2017-08-31 NOTE — Telephone Encounter (Signed)
S/w HH stated pt did not take torsemide yesterday due to having appointments. .  Pt will not take torsemide when pt has appts.  HH also stated pt states knows body and will adjust torsemide.  Reiterated pt should use large ace bandages to wrap legs and stay away from salt.  Pt cannot use compression hose.

## 2017-09-01 ENCOUNTER — Telehealth: Payer: Self-pay | Admitting: Nurse Practitioner

## 2017-09-01 NOTE — Telephone Encounter (Signed)
New message   Pt returning call about lab results. Please call

## 2017-09-13 ENCOUNTER — Ambulatory Visit (INDEPENDENT_AMBULATORY_CARE_PROVIDER_SITE_OTHER): Payer: Medicare Other | Admitting: Sports Medicine

## 2017-09-13 ENCOUNTER — Encounter: Payer: Self-pay | Admitting: Sports Medicine

## 2017-09-13 DIAGNOSIS — M1712 Unilateral primary osteoarthritis, left knee: Secondary | ICD-10-CM

## 2017-09-13 MED ORDER — DULOXETINE HCL 30 MG PO CPEP: 30.0000 mg | ORAL_CAPSULE | Freq: Every day | ORAL | 3 refills | Status: DC

## 2017-09-13 NOTE — Assessment & Plan Note (Addendum)
Lateral compartment osteoarthritis, improved completely for 48 hours and then had a recurrence of pain. She does have some allodynia distally, adding Cymbalta. If insufficient relief after a few months of up tapering Cymbalta we will proceed with viscosupplementation. I have also given her a full pump model sample of Pennsaid (diclofenac 2% topical).

## 2017-09-13 NOTE — Progress Notes (Signed)
Subjective:    CC: Follow-up  HPI: Left leg pain: 48 hours of pain-free life after the injection but pain returned, not as bad as before.  Localized mostly at the lateral joint line of the left knee, and occasional pain over the anterolateral tibia.  She did have a leg MRI that did not show any masses.  We added some Pennsaid (diclofenac 2% topical) which also seemed to provide good relief.  I reviewed the past medical history, family history, social history, surgical history, and allergies today and no changes were needed.  Please see the problem list section below in epic for further details.  Past Medical History: Past Medical History:  Diagnosis Date  . Atrial fibrillation ([place])   . CARCINOMA, BREAST   . CHEST PAIN   . DEGENERATIVE JOINT DISEASE   . Edema   . HYPERTENSION   . Long term (current) use of anticoagulants   . Unspecified diastolic heart failure    Past Surgical History: Past Surgical History:  Procedure Laterality Date  . BREAST LUMPECTOMY    . ESOPHAGOGASTRODUODENOSCOPY (EGD) WITH PROPOFOL Left 05/28/2015   Procedure: ESOPHAGOGASTRODUODENOSCOPY (EGD) WITH PROPOFOL;  Surgeon: Wilford Corner, MD;  Location: [facility] ENDOSCOPY;  Service: Endoscopy;  Laterality: Left;  . HIP SURGERY    . KNEE SURGERY    . LAMINECTOMY    . TONSILLECTOMY     Social History: Social History   Socioeconomic History  . Marital status: Single    Spouse name: Not on file  . Number of children: Not on file  . Years of education: Not on file  . Highest education level: Not on file  Occupational History  . Not on file  Social Needs  . Financial resource strain: Not on file  . Food insecurity:    Worry: Not on file    Inability: Not on file  . Transportation needs:    Medical: Not on file    Non-medical: Not on file  Tobacco Use  . Smoking status: Never Smoker  . Smokeless tobacco: Never Used  Substance and Sexual Activity  . Alcohol use: No  . Drug use: No  . Sexual activity:  Not on file  Lifestyle  . Physical activity:    Days per week: Not on file    Minutes per session: Not on file  . Stress: Not on file  Relationships  . Social connections:    Talks on phone: Not on file    Gets together: Not on file    Attends religious service: Not on file    Active member of club or organization: Not on file    Attends meetings of clubs or organizations: Not on file    Relationship status: Not on file  Other Topics Concern  . Not on file  Social History Narrative  . Not on file   Family History: Family History  Problem Relation Age of Onset  . CAD Mother   . Heart attack Mother   . Hypertension Mother   . CAD Father   . Stroke Brother   . Heart attack Brother    Allergies: Allergies  Allergen Reactions  . Morphine And Related Other (See Comments)    Other Reaction: mild Intolerance, Allergy  . Sulfa Antibiotics     Other reaction(s): Other (See Comments) Other Reaction: mild Intolerance, Allergy  . Meperidine Nausea And Vomiting and Other (See Comments)  . Meperidine Hcl Nausea Only  . Nsaids     Other reaction(s): Other (See Comments) Bleeding  ulcer  . Oxycodone-Acetaminophen Nausea Only  . Tolmetin Other (See Comments)    Other reaction(s): Other (See Comments) Bleeding ulcer   Medications: See med rec.  Review of Systems: No fevers, chills, night sweats, weight loss, chest pain, or shortness of breath.   Objective:    General: Well Developed, well nourished, and in no acute distress.  Neuro: Alert and oriented x3, extra-ocular muscles intact, sensation grossly intact.  HEENT: Normocephalic, atraumatic, pupils equal round reactive to light, neck supple, no masses, no lymphadenopathy, thyroid nonpalpable.  Skin: Warm and dry, no rashes. Cardiac: Regular rate and rhythm, no murmurs rubs or gallops, no lower extremity edema.  Respiratory: Clear to auscultation bilaterally. Not using accessory muscles, speaking in full  sentences.  Impression and Recommendations:    Primary osteoarthritis of left knee Lateral compartment osteoarthritis, improved completely for 48 hours and then had a recurrence of pain. She does have some allodynia distally, adding Cymbalta. If insufficient relief after a few months of up tapering Cymbalta we will proceed with viscosupplementation. I have also given her a full pump model sample of Pennsaid (diclofenac 2% topical).   I spent 25 minutes with this patient, greater than 50% was face-to-face time counseling regarding the above diagnoses ___________________________________________ Gwen Her. Dianah Field, M.D., ABFM., CAQSM. Primary Care and [place] Instructor of [place] of [facility] of Medicine

## 2017-09-26 ENCOUNTER — Other Ambulatory Visit: Payer: Self-pay | Admitting: Cardiovascular Disease

## 2017-10-05 ENCOUNTER — Other Ambulatory Visit: Payer: Self-pay | Admitting: Sports Medicine

## 2017-10-05 DIAGNOSIS — M1712 Unilateral primary osteoarthritis, left knee: Secondary | ICD-10-CM

## 2017-10-15 ENCOUNTER — Ambulatory Visit (INDEPENDENT_AMBULATORY_CARE_PROVIDER_SITE_OTHER): Payer: Medicare Other | Admitting: Sports Medicine

## 2017-10-15 ENCOUNTER — Encounter: Payer: Self-pay | Admitting: Sports Medicine

## 2017-10-15 DIAGNOSIS — M1712 Unilateral primary osteoarthritis, left knee: Secondary | ICD-10-CM | POA: Diagnosis not present

## 2017-10-15 MED ORDER — DULOXETINE HCL 60 MG PO CPEP: 60.0000 mg | ORAL_CAPSULE | Freq: Every day | ORAL | 3 refills | Status: DC

## 2017-10-15 NOTE — Assessment & Plan Note (Signed)
Lateral compartment osteoarthritis did not respond for all that long after an injection. Also she had allodynia so we added Cymbalta at the last visit, and she continued with Pennsaid (diclofenac 2% topical) twice a day. She returns today for the most part pain-free, only has a bit of achiness.   Very happy with how things have gone. Increasing Cymbalta to 60 mg to try to get rid of the last bit of achiness. Return to see me in 1 month if needed.

## 2017-10-15 NOTE — Progress Notes (Signed)
Subjective:    CC: Follow-up  HPI: Knee osteoarthritis and allodynia: Symptoms are for the most part resolved after knee injection at the last visit, as well as the addition of Cymbalta.  She feels well, she feels happy, has only a bit of soreness and is agreeable to go up on Cymbalta.  No further questions.  I reviewed the past medical history, family history, social history, surgical history, and allergies today and no changes were needed.  Please see the problem list section below in epic for further details.  Past Medical History: Past Medical History:  Diagnosis Date  . Atrial fibrillation ([place])   . CARCINOMA, BREAST   . CHEST PAIN   . DEGENERATIVE JOINT DISEASE   . Edema   . HYPERTENSION   . Long term (current) use of anticoagulants   . Unspecified diastolic heart failure    Past Surgical History: Past Surgical History:  Procedure Laterality Date  . BREAST LUMPECTOMY    . ESOPHAGOGASTRODUODENOSCOPY (EGD) WITH PROPOFOL Left 05/28/2015   Procedure: ESOPHAGOGASTRODUODENOSCOPY (EGD) WITH PROPOFOL;  Surgeon: Wilford Corner, MD;  Location: [facility] ENDOSCOPY;  Service: Endoscopy;  Laterality: Left;  . HIP SURGERY    . KNEE SURGERY    . LAMINECTOMY    . TONSILLECTOMY     Social History: Social History   Socioeconomic History  . Marital status: Single    Spouse name: Not on file  . Number of children: Not on file  . Years of education: Not on file  . Highest education level: Not on file  Occupational History  . Not on file  Social Needs  . Financial resource strain: Not on file  . Food insecurity:    Worry: Not on file    Inability: Not on file  . Transportation needs:    Medical: Not on file    Non-medical: Not on file  Tobacco Use  . Smoking status: Never Smoker  . Smokeless tobacco: Never Used  Substance and Sexual Activity  . Alcohol use: No  . Drug use: No  . Sexual activity: Not on file  Lifestyle  . Physical activity:    Days per week: Not on file   Minutes per session: Not on file  . Stress: Not on file  Relationships  . Social connections:    Talks on phone: Not on file    Gets together: Not on file    Attends religious service: Not on file    Active member of club or organization: Not on file    Attends meetings of clubs or organizations: Not on file    Relationship status: Not on file  Other Topics Concern  . Not on file  Social History Narrative  . Not on file   Family History: Family History  Problem Relation Age of Onset  . CAD Mother   . Heart attack Mother   . Hypertension Mother   . CAD Father   . Stroke Brother   . Heart attack Brother    Allergies: Allergies  Allergen Reactions  . Morphine And Related Other (See Comments)    Other Reaction: mild Intolerance, Allergy  . Sulfa Antibiotics     Other reaction(s): Other (See Comments) Other Reaction: mild Intolerance, Allergy  . Meperidine Nausea And Vomiting and Other (See Comments)  . Meperidine Hcl Nausea Only  . Nsaids     Other reaction(s): Other (See Comments) Bleeding ulcer  . Oxycodone-Acetaminophen Nausea Only  . Tolmetin Other (See Comments)    Other reaction(s): Other (  See Comments) Bleeding ulcer   Medications: See med rec.  Review of Systems: No fevers, chills, night sweats, weight loss, chest pain, or shortness of breath.   Objective:    General: Well Developed, well nourished, and in no acute distress.  Neuro: Alert and oriented x3, extra-ocular muscles intact, sensation grossly intact.  HEENT: Normocephalic, atraumatic, pupils equal round reactive to light, neck supple, no masses, no lymphadenopathy, thyroid nonpalpable.  Skin: Warm and dry, no rashes. Cardiac: Regular rate and rhythm, no murmurs rubs or gallops, no lower extremity edema.  Respiratory: Clear to auscultation bilaterally. Not using accessory muscles, speaking in full sentences.  Impression and Recommendations:    Primary osteoarthritis of left knee Lateral  compartment osteoarthritis did not respond for all that long after an injection. Also she had allodynia so we added Cymbalta at the last visit, and she continued with Pennsaid (diclofenac 2% topical) twice a day. She returns today for the most part pain-free, only has a bit of achiness.   Very happy with how things have gone. Increasing Cymbalta to 60 mg to try to get rid of the last bit of achiness. Return to see me in 1 month if needed.  I spent 25 minutes with this patient, greater than 50% was face-to-face time counseling regarding the above diagnoses ___________________________________________ Gwen Her. Dianah Field, M.D., ABFM., CAQSM. Primary Care and [place] Instructor of [place] of [facility] of Medicine

## 2017-11-02 ENCOUNTER — Ambulatory Visit (INDEPENDENT_AMBULATORY_CARE_PROVIDER_SITE_OTHER): Payer: Medicare Other

## 2017-11-02 ENCOUNTER — Ambulatory Visit (INDEPENDENT_AMBULATORY_CARE_PROVIDER_SITE_OTHER): Payer: Medicare Other | Admitting: Sports Medicine

## 2017-11-02 ENCOUNTER — Encounter: Payer: Self-pay | Admitting: Sports Medicine

## 2017-11-02 DIAGNOSIS — M50321 Other cervical disc degeneration at C4-C5 level: Secondary | ICD-10-CM | POA: Diagnosis not present

## 2017-11-02 DIAGNOSIS — M1712 Unilateral primary osteoarthritis, left knee: Secondary | ICD-10-CM | POA: Diagnosis not present

## 2017-11-02 DIAGNOSIS — M4807 Spinal stenosis, lumbosacral region: Secondary | ICD-10-CM

## 2017-11-02 DIAGNOSIS — M4802 Spinal stenosis, cervical region: Secondary | ICD-10-CM | POA: Diagnosis not present

## 2017-11-02 DIAGNOSIS — G8929 Other chronic pain: Secondary | ICD-10-CM

## 2017-11-02 DIAGNOSIS — I700 Atherosclerosis of aorta: Secondary | ICD-10-CM | POA: Diagnosis not present

## 2017-11-02 DIAGNOSIS — M48061 Spinal stenosis, lumbar region without neurogenic claudication: Secondary | ICD-10-CM

## 2017-11-02 DIAGNOSIS — M4805 Spinal stenosis, thoracolumbar region: Secondary | ICD-10-CM

## 2017-11-02 DIAGNOSIS — M549 Dorsalgia, unspecified: Secondary | ICD-10-CM

## 2017-11-02 DIAGNOSIS — M5136 Other intervertebral disc degeneration, lumbar region: Secondary | ICD-10-CM | POA: Diagnosis not present

## 2017-11-02 DIAGNOSIS — M4804 Spinal stenosis, thoracic region: Secondary | ICD-10-CM

## 2017-11-02 MED ORDER — PREDNISONE 50 MG PO TABS: ORAL_TABLET | ORAL | 0 refills | Status: DC

## 2017-11-02 NOTE — Progress Notes (Addendum)
Subjective:    CC: Leg pain  HPI: [name] returns, she is a pleasant 84 year old female, she had a fibular fracture, and has had some persistent pain at the site of the fracture, also has severe lateral compartment osteoarthritis all contributing to her lateral knee and lower leg pain.  We did a knee joint injection that only provided minimal relief, oral NSAIDs, topical NSAIDs have not provided much relief either.  In addition she has noted progressive weakness of both legs, to the point where she sometimes have difficulty getting out of a chair.  No overt paresthesias, though she does have a history of breast cancer.  I reviewed the past medical history, family history, social history, surgical history, and allergies today and no changes were needed.  Please see the problem list section below in epic for further details.  Past Medical History: Past Medical History:  Diagnosis Date  . Atrial fibrillation ([place])   . CARCINOMA, BREAST   . CHEST PAIN   . DEGENERATIVE JOINT DISEASE   . Edema   . HYPERTENSION   . Long term (current) use of anticoagulants   . Unspecified diastolic heart failure    Past Surgical History: Past Surgical History:  Procedure Laterality Date  . BREAST LUMPECTOMY    . ESOPHAGOGASTRODUODENOSCOPY (EGD) WITH PROPOFOL Left 05/28/2015   Procedure: ESOPHAGOGASTRODUODENOSCOPY (EGD) WITH PROPOFOL;  Surgeon: Wilford Corner, MD;  Location: [facility] ENDOSCOPY;  Service: Endoscopy;  Laterality: Left;  . HIP SURGERY    . KNEE SURGERY    . LAMINECTOMY    . TONSILLECTOMY     Social History: Social History   Socioeconomic History  . Marital status: Single    Spouse name: Not on file  . Number of children: Not on file  . Years of education: Not on file  . Highest education level: Not on file  Occupational History  . Not on file  Social Needs  . Financial resource strain: Not on file  . Food insecurity:    Worry: Not on file    Inability: Not on file  . Transportation  needs:    Medical: Not on file    Non-medical: Not on file  Tobacco Use  . Smoking status: Never Smoker  . Smokeless tobacco: Never Used  Substance and Sexual Activity  . Alcohol use: No  . Drug use: No  . Sexual activity: Not on file  Lifestyle  . Physical activity:    Days per week: Not on file    Minutes per session: Not on file  . Stress: Not on file  Relationships  . Social connections:    Talks on phone: Not on file    Gets together: Not on file    Attends religious service: Not on file    Active member of club or organization: Not on file    Attends meetings of clubs or organizations: Not on file    Relationship status: Not on file  Other Topics Concern  . Not on file  Social History Narrative  . Not on file   Family History: Family History  Problem Relation Age of Onset  . CAD Mother   . Heart attack Mother   . Hypertension Mother   . CAD Father   . Stroke Brother   . Heart attack Brother    Allergies: Allergies  Allergen Reactions  . Morphine And Related Other (See Comments)    Other Reaction: mild Intolerance, Allergy  . Sulfa Antibiotics     Other reaction(s): Other (See  Comments) Other Reaction: mild Intolerance, Allergy  . Meperidine Nausea And Vomiting and Other (See Comments)  . Meperidine Hcl Nausea Only  . Nsaids     Other reaction(s): Other (See Comments) Bleeding ulcer  . Oxycodone-Acetaminophen Nausea Only  . Tolmetin Other (See Comments)    Other reaction(s): Other (See Comments) Bleeding ulcer   Medications: See med rec.  Review of Systems: No fevers, chills, night sweats, weight loss, chest pain, or shortness of breath.   Objective:    General: Well Developed, well nourished, and in no acute distress.  Neuro: Alert and oriented x3, extra-ocular muscles intact, sensation grossly intact.  HEENT: Normocephalic, atraumatic, pupils equal round reactive to light, neck supple, no masses, no lymphadenopathy, thyroid nonpalpable.  Skin:  Warm and dry, no rashes. Cardiac: Regular rate and rhythm, no murmurs rubs or gallops, no lower extremity edema.  Respiratory: Clear to auscultation bilaterally. Not using accessory muscles, speaking in full sentences.  Impression and Recommendations:    Primary osteoarthritis of left knee Lateral compartment osteoarthritis did not respond all that well after an injection. We added Cymbalta which only provided minimal relief, she has noted an increase in pain when going up to 60 mg so she will drop back down and stop her Cymbalta. I do think her pain is most likely coming from her lateral compartment osteoarthritis but she does have an old deformity from a fibular injury, at this point we do need a new MRI of her fibula on the left.   Spinal stenosis of thoracolumbar region With progressive lower extremity weakness, history of breast cancer. Cervical, thoracic, lumbar spine x-rays, lumbar spine MRI today. Adding 5 days of prednisone in the meantime.  MRI does show multilevel lumbar spinal stenosis, at the T10-T11 level there does appear to be some degree of thoracic spinal cord compression, I am going to add a thoracic spine MRI for further formal evaluation and set her up for a T10-T11 epidural.  There is mild spinal cord compression at T10-T11 with some edema in the cord itself. I do think she needs an urgent second opinion from spine surgery.  I spent 25 minutes with this patient, greater than 50% was face-to-face time counseling regarding the above diagnoses ___________________________________________ Gwen Her. Dianah Field, M.D., ABFM., CAQSM. Primary Care and [place] Instructor of [employer] [facility] of [employer]

## 2017-11-02 NOTE — Addendum Note (Signed)
Addended by: Silverio Decamp on: 11/02/2017 05:08 PM   Modules accepted: Orders

## 2017-11-02 NOTE — Assessment & Plan Note (Addendum)
With progressive lower extremity weakness, history of breast cancer. Cervical, thoracic, lumbar spine x-rays, lumbar spine MRI today. Adding 5 days of prednisone in the meantime.  MRI does show multilevel lumbar spinal stenosis, at the T10-T11 level there does appear to be some degree of thoracic spinal cord compression, I am going to add a thoracic spine MRI for further formal evaluation and set her up for a T10-T11 epidural.  There is mild spinal cord compression at T10-T11 with some edema in the cord itself. I do think she needs an urgent second opinion from spine surgery.

## 2017-11-02 NOTE — Assessment & Plan Note (Signed)
Lateral compartment osteoarthritis did not respond all that well after an injection. We added Cymbalta which only provided minimal relief, she has noted an increase in pain when going up to 60 mg so she will drop back down and stop her Cymbalta. I do think her pain is most likely coming from her lateral compartment osteoarthritis but she does have an old deformity from a fibular injury, at this point we do need a new MRI of her fibula on the left.

## 2017-11-08 ENCOUNTER — Ambulatory Visit (INDEPENDENT_AMBULATORY_CARE_PROVIDER_SITE_OTHER): Payer: Medicare Other

## 2017-11-08 ENCOUNTER — Other Ambulatory Visit: Payer: Medicare Other

## 2017-11-08 DIAGNOSIS — M48061 Spinal stenosis, lumbar region without neurogenic claudication: Secondary | ICD-10-CM | POA: Diagnosis not present

## 2017-11-08 DIAGNOSIS — M4804 Spinal stenosis, thoracic region: Secondary | ICD-10-CM

## 2017-11-08 DIAGNOSIS — M1712 Unilateral primary osteoarthritis, left knee: Secondary | ICD-10-CM

## 2017-11-08 DIAGNOSIS — M4805 Spinal stenosis, thoracolumbar region: Secondary | ICD-10-CM | POA: Diagnosis not present

## 2017-11-08 NOTE — Addendum Note (Signed)
Addended by: Silverio Decamp on: 11/08/2017 04:37 PM   Modules accepted: Orders

## 2018-02-06 ENCOUNTER — Other Ambulatory Visit: Payer: Self-pay | Admitting: Nurse Practitioner

## 2018-03-14 ENCOUNTER — Telehealth: Payer: Self-pay | Admitting: Cardiovascular Disease

## 2018-03-14 NOTE — Telephone Encounter (Signed)
Yes

## 2018-03-14 NOTE — Telephone Encounter (Signed)
New message    Pt c/o medication issue:  1. Name of Medication: torsemide (DEMADEX) 20 MG tablet   2. How are you currently taking this medication (dosage and times per day)? 1 in morning and 1 pill at night   3. Are you having a reaction (difficulty breathing--STAT)? Wheezing only   4. What is your medication issue? Caregiver [name] stated that dosage is not working. Pt still has a lot of fluid and has increased.

## 2018-03-14 NOTE — Telephone Encounter (Signed)
Called patient's caregiver, Cloyde Reams [facility]) about message. Patient complaining of swelling below the knees and has progressed in the last 6 weeks with no pitting, and SOB/wheezing with activity. Caregiver can not say for sure that patient takes her Torsemide as directed all the time. Caregiver wants to know if patient can take an extra torsemide daily. Patient has follow up in March with Truitt Merle NP. Will forward to Dr. Johnsie Cancel for advisement.

## 2018-03-14 NOTE — Telephone Encounter (Signed)
Called Jan Wood back about Dr. Kyla Balzarine response. Jan stated patient would only take an extra for a couple of days and they will call if this does not work and help her swelling go down.

## 2018-03-15 ENCOUNTER — Encounter: Payer: Self-pay | Admitting: Sports Medicine

## 2018-03-15 ENCOUNTER — Ambulatory Visit (INDEPENDENT_AMBULATORY_CARE_PROVIDER_SITE_OTHER): Payer: Medicare Other | Admitting: Sports Medicine

## 2018-03-15 DIAGNOSIS — M1712 Unilateral primary osteoarthritis, left knee: Secondary | ICD-10-CM | POA: Diagnosis not present

## 2018-03-15 NOTE — Assessment & Plan Note (Signed)
Severe inoperable pain from her left knee osteoarthritis. I think this is simpler than were making it.  She has pain, we need to treat it. I am going to refer her to discuss genicular radiofrequency ablation. In the meantime I am happy to take over her pain management. I do think that #30 Norco per month is probably not enough for her. I would be happy to do an up taper, but certainly I would want to do this with approval from her primary care provider with whom she has a pain contract. To Dr. Zadie Rhine, I am happy to take over her pain management. I think this point quality years of life is more important than any small risk of shortening life, i.e a hospice care mantra.

## 2018-03-15 NOTE — Progress Notes (Signed)
Subjective:    CC: Left knee pain  HPI: Is a pleasant 85 year old female with severe end-stage left knee osteoarthritis, inoperable.  She has had several injections, steroid and Visco supplementation without improvement.  She is currently in a pain contract with her PCP, 30 Norco per month it sounds, she does not often take as many as she needs for fear that she will run out and have persistent discomfort.  I reviewed the past medical history, family history, social history, surgical history, and allergies today and no changes were needed.  Please see the problem list section below in epic for further details.  Past Medical History: Past Medical History:  Diagnosis Date  . Atrial fibrillation ([place])   . CARCINOMA, BREAST   . CHEST PAIN   . DEGENERATIVE JOINT DISEASE   . Edema   . HYPERTENSION   . Long term (current) use of anticoagulants   . Unspecified diastolic heart failure    Past Surgical History: Past Surgical History:  Procedure Laterality Date  . BREAST LUMPECTOMY    . ESOPHAGOGASTRODUODENOSCOPY (EGD) WITH PROPOFOL Left 05/28/2015   Procedure: ESOPHAGOGASTRODUODENOSCOPY (EGD) WITH PROPOFOL;  Surgeon: Wilford Corner, MD;  Location: [facility] ENDOSCOPY;  Service: Endoscopy;  Laterality: Left;  . HIP SURGERY    . KNEE SURGERY    . LAMINECTOMY    . TONSILLECTOMY     Social History: Social History   Socioeconomic History  . Marital status: Single    Spouse name: Not on file  . Number of children: Not on file  . Years of education: Not on file  . Highest education level: Not on file  Occupational History  . Not on file  Social Needs  . Financial resource strain: Not on file  . Food insecurity:    Worry: Not on file    Inability: Not on file  . Transportation needs:    Medical: Not on file    Non-medical: Not on file  Tobacco Use  . Smoking status: Never Smoker  . Smokeless tobacco: Never Used  Substance and Sexual Activity  . Alcohol use: No  . Drug use: No  .  Sexual activity: Not on file  Lifestyle  . Physical activity:    Days per week: Not on file    Minutes per session: Not on file  . Stress: Not on file  Relationships  . Social connections:    Talks on phone: Not on file    Gets together: Not on file    Attends religious service: Not on file    Active member of club or organization: Not on file    Attends meetings of clubs or organizations: Not on file    Relationship status: Not on file  Other Topics Concern  . Not on file  Social History Narrative  . Not on file   Family History: Family History  Problem Relation Age of Onset  . CAD Mother   . Heart attack Mother   . Hypertension Mother   . CAD Father   . Stroke Brother   . Heart attack Brother    Allergies: Allergies  Allergen Reactions  . Morphine And Related Other (See Comments)    Other Reaction: mild Intolerance, Allergy  . Sulfa Antibiotics     Other reaction(s): Other (See Comments) Other Reaction: mild Intolerance, Allergy  . Meperidine Nausea And Vomiting and Other (See Comments)  . Meperidine Hcl Nausea Only  . Nsaids     Other reaction(s): Other (See Comments) Bleeding ulcer  .  Oxycodone-Acetaminophen Nausea Only  . Tolmetin Other (See Comments)    Other reaction(s): Other (See Comments) Bleeding ulcer   Medications: See med rec.  Review of Systems: No fevers, chills, night sweats, weight loss, chest pain, or shortness of breath.   Objective:    General: Well Developed, well nourished, and in no acute distress.  Neuro: Alert and oriented x3, extra-ocular muscles intact, sensation grossly intact.  HEENT: Normocephalic, atraumatic, pupils equal round reactive to light, neck supple, no masses, no lymphadenopathy, thyroid nonpalpable.  Skin: Warm and dry, no rashes. Cardiac: Regular rate and rhythm, no murmurs rubs or gallops, no lower extremity edema.  Respiratory: Clear to auscultation bilaterally. Not using accessory muscles, speaking in full  sentences. Left knee: Tender to palpation at the lateral joint line, minimally swollen.  Impression and Recommendations:    Primary osteoarthritis of left knee Severe inoperable pain from her left knee osteoarthritis. I think this is simpler than were making it.  She has pain, we need to treat it. I am going to refer her to discuss genicular radiofrequency ablation. In the meantime I am happy to take over her pain management. I do think that #30 Norco per month is probably not enough for her. I would be happy to do an up taper, but certainly I would want to do this with approval from her primary care provider with whom she has a pain contract. To Dr. Zadie Rhine, I am happy to take over her pain management. I think this point quality years of life is more important than any small risk of shortening life, i.e a hospice care mantra. ___________________________________________ Gwen Her. Dianah Field, M.D., ABFM., CAQSM. Primary Care and Sports Medicine [facility]  Adjunct Professor of [employer] of Medicine

## 2018-03-24 ENCOUNTER — Telehealth: Payer: Self-pay | Admitting: Sports Medicine

## 2018-03-24 MED ORDER — HYDROCODONE-ACETAMINOPHEN 5-325 MG PO TABS: 1.0000 | ORAL_TABLET | Freq: Three times a day (TID) | ORAL | 0 refills | Status: DC | PRN

## 2018-03-24 NOTE — Telephone Encounter (Signed)
Preeeeeeeeeeeeeeeeeeeeeeeeeesh!

## 2018-03-24 NOTE — Telephone Encounter (Signed)
I had discussed taking over medical pain management for [facility].  I sent a note to her PCP, she needs 2-3 Norco daily to keep her pain under control. I am going to send in a prescription for this. We can start her on a pain management contract as well. Please call her and let her know that I have sent in the medication, and to see me in a month to see how things are going, I would also like to bring her back at some point may be in a nurse visit to fill out a pain management contract.

## 2018-03-24 NOTE — Telephone Encounter (Signed)
Pt and Pt's niece, Jan ([phone]), advised. Follow up made for one month. She will sign pain contact at that time. No further questions.

## 2018-04-13 ENCOUNTER — Encounter: Payer: Self-pay | Admitting: Nurse Practitioner

## 2018-04-25 ENCOUNTER — Encounter: Payer: Self-pay | Admitting: Sports Medicine

## 2018-04-25 ENCOUNTER — Ambulatory Visit (INDEPENDENT_AMBULATORY_CARE_PROVIDER_SITE_OTHER): Payer: Medicare Other | Admitting: Sports Medicine

## 2018-04-25 DIAGNOSIS — M1712 Unilateral primary osteoarthritis, left knee: Secondary | ICD-10-CM | POA: Diagnosis not present

## 2018-04-25 MED ORDER — HYDROCODONE-ACETAMINOPHEN 5-325 MG PO TABS: 1.0000 | ORAL_TABLET | Freq: Three times a day (TID) | ORAL | 0 refills | Status: DC

## 2018-04-25 NOTE — Progress Notes (Signed)
Subjective:    CC: Recheck chronic pain  HPI: [name] returns, she is a pleasant 85 year old female.  She has inoperable left knee osteoarthritis, end-stage.  We have tried multiple injections, and I have referred her for genicular radiofrequency ablation.  This has not yet been done.  Ultimately we have decided on medical pain management.  She is doing 2 Norco daily, this is partially efficacious.  She is agreeable to be some additional home health physical therapy to improve her mobility.  And she is going to go up to [place] 3 times daily.  I reviewed the past medical history, family history, social history, surgical history, and allergies today and no changes were needed.  Please see the problem list section below in epic for further details.  Past Medical History: Past Medical History:  Diagnosis Date  . Atrial fibrillation ([place])   . CARCINOMA, BREAST   . CHEST PAIN   . DEGENERATIVE JOINT DISEASE   . Edema   . HYPERTENSION   . Long term (current) use of anticoagulants   . Unspecified diastolic heart failure    Past Surgical History: Past Surgical History:  Procedure Laterality Date  . BREAST LUMPECTOMY    . ESOPHAGOGASTRODUODENOSCOPY (EGD) WITH PROPOFOL Left 05/28/2015   Procedure: ESOPHAGOGASTRODUODENOSCOPY (EGD) WITH PROPOFOL;  Surgeon: Wilford Corner, MD;  Location: [facility] ENDOSCOPY;  Service: Endoscopy;  Laterality: Left;  . HIP SURGERY    . KNEE SURGERY    . LAMINECTOMY    . TONSILLECTOMY     Social History: Social History   Socioeconomic History  . Marital status: Single    Spouse name: Not on file  . Number of children: Not on file  . Years of education: Not on file  . Highest education level: Not on file  Occupational History  . Not on file  Social Needs  . Financial resource strain: Not on file  . Food insecurity:    Worry: Not on file    Inability: Not on file  . Transportation needs:    Medical: Not on file    Non-medical: Not on file  Tobacco Use  .  Smoking status: Never Smoker  . Smokeless tobacco: Never Used  Substance and Sexual Activity  . Alcohol use: No  . Drug use: No  . Sexual activity: Not on file  Lifestyle  . Physical activity:    Days per week: Not on file    Minutes per session: Not on file  . Stress: Not on file  Relationships  . Social connections:    Talks on phone: Not on file    Gets together: Not on file    Attends religious service: Not on file    Active member of club or organization: Not on file    Attends meetings of clubs or organizations: Not on file    Relationship status: Not on file  Other Topics Concern  . Not on file  Social History Narrative  . Not on file   Family History: Family History  Problem Relation Age of Onset  . CAD Mother   . Heart attack Mother   . Hypertension Mother   . CAD Father   . Stroke Brother   . Heart attack Brother    Allergies: Allergies  Allergen Reactions  . Morphine And Related Other (See Comments)    Other Reaction: mild Intolerance, Allergy  . Sulfa Antibiotics     Other reaction(s): Other (See Comments) Other Reaction: mild Intolerance, Allergy  . Meperidine Nausea And Vomiting  and Other (See Comments)  . Meperidine Hcl Nausea Only  . Nsaids     Other reaction(s): Other (See Comments) Bleeding ulcer  . Tolmetin Other (See Comments)    Other reaction(s): Other (See Comments) Bleeding ulcer   Medications: See med rec.  Review of Systems: No fevers, chills, night sweats, weight loss, chest pain, or shortness of breath.   Objective:    General: Well Developed, well nourished, and in no acute distress.  Neuro: Alert and oriented x3, extra-ocular muscles intact, sensation grossly intact.  HEENT: Normocephalic, atraumatic, pupils equal round reactive to light, neck supple, no masses, no lymphadenopathy, thyroid nonpalpable.  Skin: Warm and dry, no rashes. Cardiac: Regular rate and rhythm, no murmurs rubs or gallops, no lower extremity edema.    Respiratory: Clear to auscultation bilaterally. Not using accessory muscles, speaking in full sentences.  Impression and Recommendations:    Primary osteoarthritis of left knee Severe inoperable left knee arthritis pain. I have not heard back regarding genicular radiofrequency ablation. Ultimately #90 Norco per month has been efficacious, she really is only using it twice a day, I have recommended that she go up to 3 times daily, refilling it. Pain contract signed today. Adding additional home health physical therapy. Return to see me in 2 months.  I spent 25 minutes with this patient, greater than 50% was face-to-face time counseling regarding the above diagnoses, specifically risks, benefits, alternatives to medical pain management with controlled substances. ___________________________________________ Gwen Her. Dianah Field, M.D., ABFM., CAQSM. Primary Care and Sports Medicine [facility]  Adjunct Professor of [employer]

## 2018-04-25 NOTE — Assessment & Plan Note (Signed)
Severe inoperable left knee arthritis pain. I have not heard back regarding genicular radiofrequency ablation. Ultimately #90 Norco per month has been efficacious, she really is only using it twice a day, I have recommended that she go up to 3 times daily, refilling it. Pain contract signed today. Adding additional home health physical therapy. Return to see me in 2 months.

## 2018-05-02 ENCOUNTER — Ambulatory Visit: Payer: Medicare Other | Admitting: Nurse Practitioner

## 2018-06-07 ENCOUNTER — Other Ambulatory Visit: Payer: Self-pay | Admitting: Sports Medicine

## 2018-06-07 DIAGNOSIS — M1712 Unilateral primary osteoarthritis, left knee: Secondary | ICD-10-CM

## 2018-06-07 MED ORDER — HYDROCODONE-ACETAMINOPHEN 5-325 MG PO TABS: 1.0000 | ORAL_TABLET | Freq: Three times a day (TID) | ORAL | 0 refills | Status: DC

## 2018-06-07 NOTE — Telephone Encounter (Signed)
Patients niece (Jan [phone]) called and patient is in need of a refill of Hydrocodone 5-325 mg sent to [facility]. Last refill was 04/25/2018 and patient has signed pain contract. Please advise.

## 2018-06-07 NOTE — Telephone Encounter (Signed)
I called and spoke with [name] and she was advised the Hydrocodone was sent to [facility]. She wants to know if the appointment is cancelled and I advised her that she would receive a call from the office to reschedule and she voices understanding. No further questions at this time.

## 2018-06-24 ENCOUNTER — Ambulatory Visit: Payer: Medicare Other | Admitting: Sports Medicine

## 2018-06-29 ENCOUNTER — Ambulatory Visit (INDEPENDENT_AMBULATORY_CARE_PROVIDER_SITE_OTHER): Payer: Medicare Other | Admitting: Sports Medicine

## 2018-06-29 DIAGNOSIS — M1712 Unilateral primary osteoarthritis, left knee: Secondary | ICD-10-CM | POA: Diagnosis not present

## 2018-06-29 MED ORDER — HYDROCODONE-ACETAMINOPHEN 5-325 MG PO TABS: 1.0000 | ORAL_TABLET | Freq: Three times a day (TID) | ORAL | 0 refills | Status: DC

## 2018-06-29 MED ORDER — DICLOFENAC SODIUM 1 % TD GEL: 4.0000 g | Freq: Four times a day (QID) | TRANSDERMAL | 11 refills | Status: DC

## 2018-06-29 NOTE — Progress Notes (Signed)
Virtual Visit via Telephone   I connected with  [name]  on 06/29/18 by telephone/telehealth and verified that I am speaking with the correct person using two identifiers.   I discussed the limitations, risks, security and privacy concerns of performing an evaluation and management service by telephone, including the higher likelihood of inaccurate diagnosis and treatment, and the availability of in person appointments.  We also discussed the likely need of an additional face to face encounter for complete and high quality delivery of care.  I also discussed with the patient that there may be a patient responsible charge related to this service. The patient expressed understanding and wishes to proceed.  Provider location is either at home or medical facility. Patient location is at their home, different from provider location. People involved in care of the patient during this telehealth encounter were myself, my nurse/medical assistant, and my front office/scheduling team member.  Subjective:    CC: Follow-up knee pain  HPI: This is a pleasant 85 year old female, she has severe knee pain.  End-stage osteoarthritis that is inoperable.  She was not interested in genicular radiofrequency ablation, Norco at 3 pills daily has provided good relief, and she is happy to keep doing this.  I reviewed the past medical history, family history, social history, surgical history, and allergies today and no changes were needed.  Please see the problem list section below in epic for further details.  Past Medical History: Past Medical History:  Diagnosis Date  . Atrial fibrillation ([place])   . CARCINOMA, BREAST   . CHEST PAIN   . DEGENERATIVE JOINT DISEASE   . Edema   . HYPERTENSION   . Long term (current) use of anticoagulants   . Unspecified diastolic heart failure    Past Surgical History: Past Surgical History:  Procedure Laterality Date  . BREAST LUMPECTOMY    .  ESOPHAGOGASTRODUODENOSCOPY (EGD) WITH PROPOFOL Left 05/28/2015   Procedure: ESOPHAGOGASTRODUODENOSCOPY (EGD) WITH PROPOFOL;  Surgeon: Wilford Corner, MD;  Location: [facility] ENDOSCOPY;  Service: Endoscopy;  Laterality: Left;  . HIP SURGERY    . KNEE SURGERY    . LAMINECTOMY    . TONSILLECTOMY     Social History: Social History   Socioeconomic History  . Marital status: Single    Spouse name: Not on file  . Number of children: Not on file  . Years of education: Not on file  . Highest education level: Not on file  Occupational History  . Not on file  Social Needs  . Financial resource strain: Not on file  . Food insecurity:    Worry: Not on file    Inability: Not on file  . Transportation needs:    Medical: Not on file    Non-medical: Not on file  Tobacco Use  . Smoking status: Never Smoker  . Smokeless tobacco: Never Used  Substance and Sexual Activity  . Alcohol use: No  . Drug use: No  . Sexual activity: Not on file  Lifestyle  . Physical activity:    Days per week: Not on file    Minutes per session: Not on file  . Stress: Not on file  Relationships  . Social connections:    Talks on phone: Not on file    Gets together: Not on file    Attends religious service: Not on file    Active member of club or organization: Not on file    Attends meetings of clubs or organizations: Not on file  Relationship status: Not on file  Other Topics Concern  . Not on file  Social History Narrative  . Not on file   Family History: Family History  Problem Relation Age of Onset  . CAD Mother   . Heart attack Mother   . Hypertension Mother   . CAD Father   . Stroke Brother   . Heart attack Brother    Allergies: Allergies  Allergen Reactions  . Morphine And Related Other (See Comments)    Other Reaction: mild Intolerance, Allergy  . Sulfa Antibiotics     Other reaction(s): Other (See Comments) Other Reaction: mild Intolerance, Allergy  . Meperidine Nausea And Vomiting and  Other (See Comments)  . Meperidine Hcl Nausea Only  . Nsaids     Other reaction(s): Other (See Comments) Bleeding ulcer  . Tolmetin Other (See Comments)    Other reaction(s): Other (See Comments) Bleeding ulcer   Medications: See med rec.  Review of Systems: No fevers, chills, night sweats, weight loss, chest pain, or shortness of breath.   Objective:    General: Speaking full sentences, no audible heavy breathing.  Sounds alert and appropriately interactive.  No other physical exam performed due to the non-face to face nature of this visit.  Impression and Recommendations:    Primary osteoarthritis of left knee Severe and operative left knee osteoarthritis. Patient declines genicular radiofrequency ablation. Ultimately #90 Norco per month has been efficacious along with topical Voltaren. She has a pain contract and I am happy to do the Voltaren consistently long-term. No adverse effects, constipation, no excessive drowsiness.  I discussed the above assessment and treatment plan with the patient. The patient was provided an opportunity to ask questions and all were answered. The patient agreed with the plan and demonstrated an understanding of the instructions.   The patient was advised to call back or seek an in-person evaluation if the symptoms worsen or if the condition fails to improve as anticipated.   I provided 25 minutes of non-face-to-face time during this encounter, 15 minutes of additional time was needed to gather information, review chart, records, communicate/coordinate with staff remotely, and complete documentation.   ___________________________________________ Gwen Her. Dianah Field, M.D., ABFM., CAQSM. Primary Care and Sports Medicine [facility]  Adjunct Professor of [employer] [employer] [facility] [employer]

## 2018-06-29 NOTE — Assessment & Plan Note (Signed)
Severe and operative left knee osteoarthritis. Patient declines genicular radiofrequency ablation. Ultimately #90 Norco per month has been efficacious along with topical Voltaren. She has a pain contract and I am happy to do the Voltaren consistently long-term. No adverse effects, constipation, no excessive drowsiness.

## 2018-08-10 ENCOUNTER — Other Ambulatory Visit: Payer: Self-pay | Admitting: Nurse Practitioner

## 2018-08-17 ENCOUNTER — Other Ambulatory Visit: Payer: Self-pay | Admitting: *Deleted

## 2018-08-17 DIAGNOSIS — M1712 Unilateral primary osteoarthritis, left knee: Secondary | ICD-10-CM

## 2018-08-17 MED ORDER — HYDROCODONE-ACETAMINOPHEN 5-325 MG PO TABS: 1.0000 | ORAL_TABLET | Freq: Three times a day (TID) | ORAL | 0 refills | Status: DC

## 2018-08-26 ENCOUNTER — Telehealth: Payer: Self-pay | Admitting: *Deleted

## 2018-08-26 ENCOUNTER — Other Ambulatory Visit: Payer: Self-pay | Admitting: *Deleted

## 2018-08-26 MED ORDER — POSTURE SEAT MISC: 0 refills | Status: DC

## 2018-08-26 NOTE — Telephone Encounter (Signed)
Rx ordered

## 2018-08-26 NOTE — Telephone Encounter (Signed)
Pt's caretaker left vm requesting an order for a new lift chair.

## 2018-09-26 ENCOUNTER — Other Ambulatory Visit: Payer: Self-pay | Admitting: *Deleted

## 2018-09-26 DIAGNOSIS — M1712 Unilateral primary osteoarthritis, left knee: Secondary | ICD-10-CM

## 2018-09-26 MED ORDER — HYDROCODONE-ACETAMINOPHEN 5-325 MG PO TABS: 1.0000 | ORAL_TABLET | Freq: Three times a day (TID) | ORAL | 0 refills | Status: DC

## 2018-10-01 ENCOUNTER — Other Ambulatory Visit: Payer: Self-pay | Admitting: Cardiovascular Disease

## 2018-10-11 ENCOUNTER — Telehealth: Payer: Self-pay | Admitting: *Deleted

## 2018-10-11 NOTE — Telephone Encounter (Signed)
Absolutely needs to come in for that.

## 2018-10-11 NOTE — Telephone Encounter (Signed)
Pt's caretaker left vm stating that pt has about 1/4" hard immovable mass on her neck and they wanted to know if she needs to come in to the office to have this evaluated.

## 2018-10-17 ENCOUNTER — Other Ambulatory Visit: Payer: Self-pay | Admitting: Cardiovascular Disease

## 2018-10-17 ENCOUNTER — Other Ambulatory Visit: Payer: Self-pay | Admitting: Family Medicine

## 2018-10-17 DIAGNOSIS — R221 Localized swelling, mass and lump, neck: Secondary | ICD-10-CM

## 2018-11-01 ENCOUNTER — Other Ambulatory Visit: Payer: Self-pay | Admitting: Cardiovascular Disease

## 2018-11-08 ENCOUNTER — Other Ambulatory Visit: Payer: Self-pay | Admitting: *Deleted

## 2018-11-08 DIAGNOSIS — M1712 Unilateral primary osteoarthritis, left knee: Secondary | ICD-10-CM

## 2018-11-08 MED ORDER — HYDROCODONE-ACETAMINOPHEN 5-325 MG PO TABS: 1.0000 | ORAL_TABLET | Freq: Three times a day (TID) | ORAL | 0 refills | Status: DC

## 2018-11-09 ENCOUNTER — Other Ambulatory Visit: Payer: Self-pay | Admitting: Cardiovascular Disease

## 2018-11-11 ENCOUNTER — Emergency Department (HOSPITAL_COMMUNITY)
Admission: EM | Admit: 2018-11-11 | Discharge: 2018-11-11 | Disposition: A | Discharge status: Home / Self Care | Payer: Medicare Other | Attending: Emergency Medicine | Admitting: Emergency Medicine

## 2018-11-11 ENCOUNTER — Emergency Department (HOSPITAL_COMMUNITY): Payer: Medicare Other

## 2018-11-11 ENCOUNTER — Encounter (HOSPITAL_COMMUNITY): Payer: Self-pay | Admitting: Emergency Medicine

## 2018-11-11 ENCOUNTER — Other Ambulatory Visit: Payer: Self-pay

## 2018-11-11 DIAGNOSIS — Z20828 Contact with and (suspected) exposure to other viral communicable diseases: Secondary | ICD-10-CM | POA: Diagnosis not present

## 2018-11-11 DIAGNOSIS — Z79899 Other long term (current) drug therapy: Secondary | ICD-10-CM | POA: Diagnosis not present

## 2018-11-11 DIAGNOSIS — Z853 Personal history of malignant neoplasm of breast: Secondary | ICD-10-CM | POA: Insufficient documentation

## 2018-11-11 DIAGNOSIS — I482 Chronic atrial fibrillation, unspecified: Secondary | ICD-10-CM

## 2018-11-11 DIAGNOSIS — I5030 Unspecified diastolic (congestive) heart failure: Secondary | ICD-10-CM | POA: Diagnosis not present

## 2018-11-11 DIAGNOSIS — Z7901 Long term (current) use of anticoagulants: Secondary | ICD-10-CM | POA: Insufficient documentation

## 2018-11-11 DIAGNOSIS — R4182 Altered mental status, unspecified: Secondary | ICD-10-CM

## 2018-11-11 DIAGNOSIS — G459 Transient cerebral ischemic attack, unspecified: Secondary | ICD-10-CM

## 2018-11-11 DIAGNOSIS — I110 Hypertensive heart disease with heart failure: Secondary | ICD-10-CM | POA: Insufficient documentation

## 2018-11-11 DIAGNOSIS — R221 Localized swelling, mass and lump, neck: Secondary | ICD-10-CM

## 2018-11-11 LAB — BASIC METABOLIC PANEL
Anion gap: 9 (ref 5–15)
BUN: 33 mg/dL — ABNORMAL HIGH (ref 8–23)
CO2: 26 mmol/L (ref 22–32)
Calcium: 9.1 mg/dL (ref 8.9–10.3)
Chloride: 105 mmol/L (ref 98–111)
Creatinine, Ser: 1.14 mg/dL — ABNORMAL HIGH (ref 0.44–1.00)
GFR calc Af Amer: 48 mL/min — ABNORMAL LOW (ref >60)
GFR calc non Af Amer: 41 mL/min — ABNORMAL LOW (ref >60)
Glucose, Bld: 90 mg/dL (ref 70–99)
Potassium: 4.1 mmol/L (ref 3.5–5.1)
Sodium: 140 mmol/L (ref 135–145)

## 2018-11-11 LAB — CBC
HCT: 43.3 % (ref 36.0–46.0)
Hemoglobin: 13.9 g/dL (ref 12.0–15.0)
MCH: 31.2 pg (ref 26.0–34.0)
MCHC: 32.1 g/dL (ref 30.0–36.0)
MCV: 97.3 fL (ref 80.0–100.0)
Platelets: 257 10*3/uL (ref 150–400)
RBC: 4.45 MIL/uL (ref 3.87–5.11)
RDW: 14.4 % (ref 11.5–15.5)
WBC: 9.2 10*3/uL (ref 4.0–10.5)
nRBC: 0 % (ref 0.0–0.2)

## 2018-11-11 LAB — URINALYSIS, ROUTINE W REFLEX MICROSCOPIC
Bacteria, UA: NONE SEEN
Bilirubin Urine: NEGATIVE
Glucose, UA: NEGATIVE mg/dL
Ketones, ur: NEGATIVE mg/dL
Leukocytes,Ua: NEGATIVE
Nitrite: NEGATIVE
Protein, ur: NEGATIVE mg/dL
Specific Gravity, Urine: 1.008 (ref 1.005–1.030)
pH: 6 (ref 5.0–8.0)

## 2018-11-11 LAB — TROPONIN I (HIGH SENSITIVITY): Troponin I (High Sensitivity): 42 ng/L — ABNORMAL HIGH (ref <18)

## 2018-11-11 LAB — SARS CORONAVIRUS 2 BY RT PCR (HOSPITAL ORDER, PERFORMED IN ~~LOC~~ HOSPITAL LAB): SARS Coronavirus 2: NEGATIVE

## 2018-11-11 MED ORDER — ASPIRIN EC 81 MG PO TBEC: 81.0000 mg | DELAYED_RELEASE_TABLET | Freq: Every day | ORAL | 2 refills | Status: DC

## 2018-11-11 MED ORDER — PANTOPRAZOLE SODIUM 20 MG PO TBEC: 20.0000 mg | DELAYED_RELEASE_TABLET | Freq: Every day | ORAL | 0 refills | Status: DC

## 2018-11-11 MED ORDER — PANTOPRAZOLE SODIUM 20 MG PO TBEC
20.0000 mg | DELAYED_RELEASE_TABLET | Freq: Once | ORAL | Status: AC
  Administered 2018-11-11: 20 mg via ORAL
  Filled 2018-11-11 (×2): qty 1

## 2018-11-11 MED ORDER — ASPIRIN 81 MG PO CHEW
324.0000 mg | CHEWABLE_TABLET | Freq: Once | ORAL | Status: AC
  Administered 2018-11-11: 324 mg via ORAL
  Filled 2018-11-11: qty 4

## 2018-11-11 MED ORDER — IOHEXOL 350 MG/ML SOLN
40.0000 mL | Freq: Once | INTRAVENOUS | Status: AC | PRN
  Administered 2018-11-11: 40 mL via INTRAVENOUS

## 2018-11-11 MED ORDER — IOHEXOL 300 MG/ML  SOLN
75.0000 mL | Freq: Once | INTRAMUSCULAR | Status: AC | PRN
  Administered 2018-11-11: 17:00:00 75 mL via INTRAVENOUS

## 2018-11-11 NOTE — Progress Notes (Signed)
ED CALL NOTE/CHART REVIEW NOTE  Called by EDP regarding abnormal carotid finding on a CT soft tissue neck. Radiology recommends CT perfusion. I concur with obtaining that if there is a concern for ICA occlusion. Neurological exam is at baseline per EDP. Briefly spoke with patient and niece at bedside while attending to other tasks in ER. Patient denied any symptoms of loss of consciousness or altered consciousness or speech difficulty. CT perfusion was performed- no perfusion deficit Patient does have a history of A. fib, but has had extensive problems with anticoagulation and antiplatelets in terms of GI bleeding. At this point, given her advanced age and discussion with family, she will follow-up with primary care.  CTH now and 6 hours ago with no change - would have expected hypodensity in evolution of early stroke. From the concern for left ICA occlusion noted on the CTA neck soft tissue- follow-up CT perfusion study did not show any perfusion deficit indicating that these LICA occlusion is either chronic or well compensated. Given deranged renal function, CTA head and neck was not performed.  Recommendations -Discuss with primary care about possible anticoag vs antiplatelets for stroke prevention given h/o Afib. From a pure neurology perspective, if a patient has had stroke or tia sx, DOAC for Afib should be used, but she denies having had any stroke or TIA sx and LICA occlusion appears chronic, so I am not really hard pressed to prescribe antiplatelets or anticoagulants when the side effects of such agents caused bleeding problems from the gut -Patient and family both denied any clear history of loss of consciousness or speech difficulties that had been earlier provided by the EMS or the caregiver- this that the caregiver presumably overreacted.  Patient and family amenable to the plan.  Dr. Wilson Singer also amenable to plan.  Please call with questions.  -- Amie Portland, MD [facility] Pager: [phone] If 7pm to 7am, please call on call as listed on AMION.  No charge note

## 2018-11-11 NOTE — Discharge Instructions (Addendum)
1.  Take Protonix daily. 2.  Follow-up with your doctor as soon as possible.

## 2018-11-11 NOTE — ED Provider Notes (Signed)
05:19 PM  Spoke with Radiology regarding CT read. Discussed with Dr. Rory Percy who is following and will evaluate.   Nanda Quinton, MD   Margette Fast, MD 11/11/18 [phone]

## 2018-11-11 NOTE — ED Triage Notes (Signed)
Pt arrives via EMS from home with reports of a brief episode of unresponsiveness followed by some confusion witnessed by home rehab. Pt A&Ox4 en route.

## 2018-11-11 NOTE — ED Provider Notes (Signed)
[facility] EMERGENCY DEPARTMENT Provider Note   CSN: [id number] Arrival date & time: 11/11/18  [id number]     History   Chief Complaint Chief Complaint  Patient presents with  . Altered Mental Status    HPI [name] is a 85 y.o. adult.     HPI Patient had episode of altered level of consciousness this morning at around 9:00.  She had her caregiver with her.  Reportedly the patient was sitting in her chair and just became poorly responsive.  She was staring and not answering.  She did not collapse or fall out of the chair.  The caregiver reportedly observed her to have some appearance of left-sided facial droop and less movement of the left.  She called for help from the neighbor who also is the patient's other caregiver.  This companion is with the patient at night and manage her medications is very familiar with the patient.  She reports that she is completely back to normal now.  That had resolved just prior to her coming to check on the patient.  Patient has no complaints.  She denies any pain.  She reports she has been feeling well.  She denies headache, chest pain, shortness of breath, nausea, weakness numbness or tingling of extremities.  Patient's caregiver reports that the patient is not anticoagulated for her atrial fibrillation.  She reports that she is a significant fall risk so the risk-benefit is against anticoagulation.  She does not take a daily aspirin.  Her care provider reports that several years ago she had ulcers which were attributed to chronic NSAID use.  She was on a PPI at that time but has not had any recurrence or been on a PPI for several years. Past Medical History:  Diagnosis Date  . Atrial fibrillation ([place])   . CARCINOMA, BREAST   . CHEST PAIN   . DEGENERATIVE JOINT DISEASE   . Edema   . HYPERTENSION   . Long term (current) use of anticoagulants   . Unspecified diastolic heart failure     Patient Active Problem List   Diagnosis  Date Noted  . Spinal stenosis of thoracolumbar region 11/02/2017  . Elevated troponin 05/25/2015  . GIB (gastrointestinal bleeding) 05/24/2015  . GI bleed 05/24/2015  . Right shoulder pain 05/24/2015  . Primary osteoarthritis of left knee 05/24/2015  . Bleeding gastrointestinal   . Encounter for therapeutic drug monitoring 09/15/2013  . Hip pain 05/02/2012  . Fall 05/02/2012  . Long term (current) use of anticoagulants 08/08/2010  . DEGENERATIVE JOINT DISEASE 08/06/2008  . Malignant neoplasm of female breast ([place]) 08/03/2008  . Essential hypertension 08/03/2008  . Atrial fibrillation ([place]) 08/03/2008  . Diastolic heart failure ([place]) 08/03/2008  . EDEMA 08/03/2008  . CHEST PAIN 08/03/2008    Past Surgical History:  Procedure Laterality Date  . BREAST LUMPECTOMY    . ESOPHAGOGASTRODUODENOSCOPY (EGD) WITH PROPOFOL Left 05/28/2015   Procedure: ESOPHAGOGASTRODUODENOSCOPY (EGD) WITH PROPOFOL;  Surgeon: Wilford Corner, MD;  Location: [facility] ENDOSCOPY;  Service: Endoscopy;  Laterality: Left;  . HIP SURGERY    . KNEE SURGERY    . LAMINECTOMY    . TONSILLECTOMY       OB History   No obstetric history on file.      Home Medications    Prior to Admission medications   Medication Sig Start Date End Date Taking? Authorizing Provider  calcium-vitamin D (OSCAL WITH D) 500-200 MG-UNIT per tablet Take 1 tablet by mouth daily.  [provider]  cholecalciferol (VITAMIN D) 1000 UNITS tablet Take 1,000 Units by mouth daily.    [provider]  diclofenac sodium (VOLTAREN) 1 % GEL Apply 4 g topically 4 (four) times daily. Knee 06/29/18   Silverio Decamp, MD  HYDROcodone-acetaminophen (NORCO/VICODIN) 5-325 MG tablet Take 1 tablet by mouth 3 (three) times daily. 11/08/18   Silverio Decamp, MD  Misc. Devices (POSTURE SEAT) MISC Lift chair 08/26/18   Silverio Decamp, MD  Multiple Vitamins-Minerals (CENTRUM PO) Take 1 tablet by mouth daily.    [provider]  Omega-3 Fatty Acids (FISH OIL PO) Take 1 tablet by mouth daily.    [provider]  polyethylene glycol (MIRALAX / GLYCOLAX) packet Take 17 g by mouth daily. CONSTIPATION    [provider]  potassium chloride SA (KLOR-CON M20) 20 MEQ tablet Take 1 tablet (20 mEq total) by mouth 2 (two) times daily. 11/01/18   Josue Hector, MD  senna (SENOKOT) 8.6 MG TABS tablet Take 2 tablets (17.2 mg total) by mouth at bedtime. Patient taking differently: Take 2 tablets by mouth as needed.  05/30/15   Jonetta Osgood, MD  torsemide (DEMADEX) 20 MG tablet TAKE 2 TABLETS BY MOUTH EVERY DAY 11/09/18   Josue Hector, MD  vitamin C (ASCORBIC ACID) 500 MG tablet Take 500 mg by mouth daily.    [provider]    Family History Family History  Problem Relation Age of Onset  . CAD Mother   . Heart attack Mother   . Hypertension Mother   . CAD Father   . Stroke Brother   . Heart attack Brother     Social History Social History   Tobacco Use  . Smoking status: Never Smoker  . Smokeless tobacco: Never Used  Substance Use Topics  . Alcohol use: No  . Drug use: No     Allergies   Morphine and related, Sulfa antibiotics, Meperidine, Meperidine hcl, Nsaids, and Tolmetin   Review of Systems Review of Systems 10 Systems reviewed and are negative for acute change except as noted in the HPI.   Physical Exam Updated Vital Signs BP 121/76   Pulse 61   Temp 98.1 F (36.7 C) (Oral)   Resp 14   Ht 5\' 5"  (1.651 m)   Wt 99.8 kg   SpO2 98%   BMI 36.61 kg/m   Physical Exam Constitutional:      Comments: Patient is alert and nontoxic.  No distress.  No respiratory distress.  HENT:     Head: Normocephalic and atraumatic.     Mouth/Throat:     Mouth: Mucous membranes are moist.     Pharynx: Oropharynx is clear.  Eyes:     Extraocular Movements: Extraocular movements intact.     Pupils: Pupils are equal, round, and reactive to light.  Neck:      Musculoskeletal: Neck supple.  Cardiovascular:     Pulses: Normal pulses.     Comments: Irregularly irregular. Pulmonary:     Effort: Pulmonary effort is normal.     Breath sounds: Normal breath sounds.  Abdominal:     General: There is no distension.     Palpations: Abdomen is soft.     Tenderness: There is no abdominal tenderness. There is no guarding.  Musculoskeletal:     Comments: 1+ pitting edema bilateral lower extremities.  Hyperpigmentation consistent with chronic venous stasis.  (Caregiver reports this is chronic).  Neurological:  General: No focal deficit present.     Mental Status: He is oriented to person, place, and time.     Cranial Nerves: No cranial nerve deficit.     Sensory: No sensory deficit.     Motor: No weakness.     Coordination: Coordination normal.  Psychiatric:        Mood and Affect: Mood normal.      ED Treatments / Results  Labs (all labs ordered are listed, but only abnormal results are displayed) Labs Reviewed  BASIC METABOLIC PANEL  CBC  URINALYSIS, ROUTINE W REFLEX MICROSCOPIC    EKG EKG Interpretation  Date/Time:  Friday November 11 2018 09:57:45 EDT Ventricular Rate:  57 PR Interval:    QRS Duration: 101 QT Interval:  451 QTC Calculation: 412 R Axis:   53 Text Interpretation:  Atrial flutter RSR' in V1 or V2, right VCD or RVH Repol abnrm, severe global ischemia (LM/MVD) Baseline wander in lead(s) II III aVF agree. more ischemic compared to previous Confirmed by Charlesetta Shanks [phone]) on 11/11/2018 1:51:56 PM   Radiology No results found.  Procedures Procedures (including critical care time)  Medications Ordered in ED Medications - No data to display   Initial Impression / Assessment and Plan / ED Course  I have reviewed the triage vital signs and the nursing notes.  Pertinent labs & imaging results that were available during my care of the patient were reviewed by me and considered in my medical decision making (see  chart for details).        Patient has no deficits on arrival.  The described event was self-limited.  Patient did not apparently collapsed.  She was in her chair with decreased level of consciousness and possible left-sided weakness.  This had all resolved prior to the current caregiver arriving.  Patient does not have complaints at this time.  We will proceed with general evaluation for stroke or syncope.  Will plan for 2 sets of troponins.  First troponin is at 40.  If second set is without significant elevation, I feel patient will be stable for discharge.  She is also getting a CT soft tissue neck for some significantly enlarged masses in the submandibular area.  These are symmetric and I have high suspicion for enlarged but nontender submandibular salivary glands.  Once CT completed, if no significantly urgent findings identified, patient will be stable for discharge.  Dr. Wilson Singer will follow-up on CT results and second troponin.  Patient has distant history of GI bleed from NSAIDs that were taken chronically.  I do feel that a daily aspirin with chronic use of a PPI would be beneficial for the patient.  I have discussed with her care provider.  Final Clinical Impressions(s) / ED Diagnoses   Final diagnoses:  Altered mental status, unspecified altered mental status type  Neck mass  Chronic atrial fibrillation    ED Discharge Orders    None       Charlesetta Shanks, MD 11/11/18 1601

## 2018-11-11 NOTE — ED Notes (Signed)
Patient transported to CT 

## 2018-11-11 NOTE — ED Notes (Signed)
Patient Alert and oriented to baseline. Stable and ambulatory to baseline. Patient verbalized understanding of the discharge instructions.  Patient belongings were taken by the patient.   

## 2018-12-01 ENCOUNTER — Other Ambulatory Visit: Payer: Self-pay | Admitting: Cardiovascular Disease

## 2018-12-15 ENCOUNTER — Other Ambulatory Visit: Payer: Self-pay | Admitting: Cardiovascular Disease

## 2018-12-26 ENCOUNTER — Other Ambulatory Visit: Payer: Self-pay | Admitting: *Deleted

## 2018-12-26 DIAGNOSIS — M1712 Unilateral primary osteoarthritis, left knee: Secondary | ICD-10-CM

## 2018-12-27 MED ORDER — HYDROCODONE-ACETAMINOPHEN 5-325 MG PO TABS: 1.0000 | ORAL_TABLET | Freq: Three times a day (TID) | ORAL | 0 refills | Status: DC

## 2019-01-14 ENCOUNTER — Other Ambulatory Visit: Payer: Self-pay | Admitting: Cardiovascular Disease

## 2019-02-14 ENCOUNTER — Other Ambulatory Visit: Payer: Self-pay | Admitting: *Deleted

## 2019-02-14 DIAGNOSIS — M1712 Unilateral primary osteoarthritis, left knee: Secondary | ICD-10-CM

## 2019-02-14 MED ORDER — HYDROCODONE-ACETAMINOPHEN 5-325 MG PO TABS: 1.0000 | ORAL_TABLET | Freq: Three times a day (TID) | ORAL | 0 refills | Status: DC

## 2019-02-17 ENCOUNTER — Other Ambulatory Visit: Payer: Self-pay | Admitting: Cardiovascular Disease

## 2019-03-08 ENCOUNTER — Telehealth: Payer: Self-pay

## 2019-03-08 NOTE — Telephone Encounter (Signed)
Thank you :)

## 2019-03-08 NOTE — Telephone Encounter (Signed)
FYI: Pt's brother Max called wanting information about how to get the COVID-19 vaccine. Instructed pt to call the local health department to schedule a time for the vaccine. Telephone number to [facility] in [place] provided to patient.

## 2019-03-22 ENCOUNTER — Ambulatory Visit: Payer: Medicare PPO | Attending: Internal Medicine

## 2019-03-22 DIAGNOSIS — Z23 Encounter for immunization: Secondary | ICD-10-CM | POA: Insufficient documentation

## 2019-03-22 NOTE — Progress Notes (Signed)
   Covid-19 Vaccination Clinic  Name:  [name]    MRN: [id number] DOB: 1922-12-12  03/22/2019  [name] was observed post Covid-19 immunization for 15 minutes without incidence. He was provided with Vaccine Information Sheet and instruction to access the V-Safe system.   [name] was instructed to call 911 with any severe reactions post vaccine: Marland Kitchen Difficulty breathing  . Swelling of your face and throat  . A fast heartbeat  . A bad rash all over your body  . Dizziness and weakness    Immunizations Administered    Name Date Dose VIS Date Route   Pfizer COVID-19 Vaccine 03/22/2019 12:37 PM 0.3 mL 02/10/2019 Intramuscular   Manufacturer: [place]   Lot: [id number]   [place]: [id number]

## 2019-04-03 ENCOUNTER — Other Ambulatory Visit: Payer: Self-pay | Admitting: *Deleted

## 2019-04-03 DIAGNOSIS — M1712 Unilateral primary osteoarthritis, left knee: Secondary | ICD-10-CM

## 2019-04-03 MED ORDER — HYDROCODONE-ACETAMINOPHEN 5-325 MG PO TABS: 1.0000 | ORAL_TABLET | Freq: Three times a day (TID) | ORAL | 0 refills | Status: DC

## 2019-04-12 ENCOUNTER — Ambulatory Visit: Payer: Medicare PPO | Attending: Internal Medicine

## 2019-04-12 DIAGNOSIS — Z23 Encounter for immunization: Secondary | ICD-10-CM | POA: Insufficient documentation

## 2019-04-12 NOTE — Progress Notes (Signed)
   Covid-19 Vaccination Clinic  Name:  [name]    MRN: [id number] DOB: [birth year]  04/12/2019  [name] was observed post Covid-19 immunization for 15 minutes without incidence. He was provided with Vaccine Information Sheet and instruction to access the V-Safe system.   [name] was instructed to call 911 with any severe reactions post vaccine: Marland Kitchen Difficulty breathing  . Swelling of your face and throat  . A fast heartbeat  . A bad rash all over your body  . Dizziness and weakness    Immunizations Administered    Name Date Dose VIS Date Route   Pfizer COVID-19 Vaccine 04/12/2019 12:18 PM 0.3 mL 02/10/2019 Intramuscular   Manufacturer: [place]   Lot: [id number]   [place]: [id number]

## 2019-05-22 ENCOUNTER — Other Ambulatory Visit: Payer: Self-pay

## 2019-05-22 DIAGNOSIS — M1712 Unilateral primary osteoarthritis, left knee: Secondary | ICD-10-CM

## 2019-05-22 MED ORDER — HYDROCODONE-ACETAMINOPHEN 5-325 MG PO TABS: 1.0000 | ORAL_TABLET | Freq: Three times a day (TID) | ORAL | 0 refills | Status: DC

## 2019-06-30 ENCOUNTER — Telehealth (INDEPENDENT_AMBULATORY_CARE_PROVIDER_SITE_OTHER): Payer: Medicare PPO | Admitting: Sports Medicine

## 2019-06-30 DIAGNOSIS — M1712 Unilateral primary osteoarthritis, left knee: Secondary | ICD-10-CM | POA: Diagnosis not present

## 2019-06-30 MED ORDER — POLYETHYLENE GLYCOL 3350 17 G PO PACK: 17.0000 g | PACK | Freq: Three times a day (TID) | ORAL | 11 refills | Status: DC

## 2019-06-30 MED ORDER — DOCUSATE SODIUM 100 MG PO CAPS: 100.0000 mg | ORAL_CAPSULE | Freq: Three times a day (TID) | ORAL | 3 refills | Status: DC | PRN

## 2019-06-30 MED ORDER — HYDROCODONE-ACETAMINOPHEN 10-325 MG PO TABS: 1.0000 | ORAL_TABLET | Freq: Three times a day (TID) | ORAL | 0 refills | Status: DC | PRN

## 2019-06-30 NOTE — Progress Notes (Signed)
   Virtual Visit via Telephone   I connected with  [name]  on 06/30/19 by telephone/telehealth and verified that I am speaking with the correct person using two identifiers.   I discussed the limitations, risks, security and privacy concerns of performing an evaluation and management service by telephone, including the higher likelihood of inaccurate diagnosis and treatment, and the availability of in person appointments.  We also discussed the likely need of an additional face to face encounter for complete and high quality delivery of care.  I also discussed with the patient that there may be a patient responsible charge related to this service. The patient expressed understanding and wishes to proceed.  Provider location is either at home or medical facility. Patient location is at their home, different from provider location. People involved in care of the patient during this telehealth encounter were myself, my nurse/medical assistant, and my front office/scheduling team member.  Review of Systems: No fevers, chills, night sweats, weight loss, chest pain, or shortness of breath.   Objective Findings:    General: Speaking full sentences, no audible heavy breathing.  Sounds alert and appropriately interactive.    Independent interpretation of tests performed by another provider:   None.  Brief History, Exam, Impression, and Recommendations:    Primary osteoarthritis of left knee This is a very pleasant 86 year old female, she has severe inoperable left knee osteoarthritis, she declined genicular radiofrequency ablation, we have done multiple types of injections without significant efficacy. Ultimately we landed on Norco, number 90/month which has been relatively efficacious along with topical Voltaren. She tells me she still has significant pain and is agreeable to go up on the dose, increasing Norco to 10/325, number 90/month. She is having some constipation so we will  increase her stool softeners to MiraLAX 3 times daily and Colace 100 mg 3 times daily with her narcotic dosing. She will call me back in a week or so to let me know how things are going, if she still is having trouble moving her stools we will add Movantik.   I discussed the above assessment and treatment plan with the patient. The patient was provided an opportunity to ask questions and all were answered. The patient agreed with the plan and demonstrated an understanding of the instructions.   The patient was advised to call back or seek an in-person evaluation if the symptoms worsen or if the condition fails to improve as anticipated.   I provided 30 minutes of face to face and non-face-to-face time during this encounter date, time was needed to gather information, review chart, records, communicate/coordinate with staff remotely, as well as complete documentation.   ___________________________________________ Gwen Her. Dianah Field, M.D., ABFM., CAQSM. Primary Care and Sports Medicine [facility]  Adjunct Professor of [employer] [facility] of Medicine

## 2019-06-30 NOTE — Assessment & Plan Note (Signed)
This is a very pleasant 86 year old female, she has severe inoperable left knee osteoarthritis, she declined genicular radiofrequency ablation, we have done multiple types of injections without significant efficacy. Ultimately we landed on Norco, number 90/month which has been relatively efficacious along with topical Voltaren. She tells me she still has significant pain and is agreeable to go up on the dose, increasing Norco to 10/325, number 90/month. She is having some constipation so we will increase her stool softeners to MiraLAX 3 times daily and Colace 100 mg 3 times daily with her narcotic dosing. She will call me back in a week or so to let me know how things are going, if she still is having trouble moving her stools we will add Movantik.

## 2019-07-24 NOTE — Progress Notes (Deleted)
{Choose 1 Note Type (Video or Telephone):[phone]}   The patient was identified using 2 identifiers.  Date:  07/24/2019   ID:  [name], DOB 19-Nov-1922, MRN [id number]  {Patient Location:[phone]::"Home"} {Provider Location:[phone]::"Home"}  PCP:  Aretta Nip, MD  Cardiologist:  No primary care provider on file. Dr. Johnsie Cancel Electrophysiologist:  None   Evaluation Performed:  {Choose Visit [phone]::"Follow-Up Visit"}  Chief Complaint:  ***  History of Present Illness:    MAILEEN MARKLEY is a 86 y.o. adult with ***  She has a history of permanent AF, chronic edema and prior GI bleed. She has had prior breast cancer treated with R lumpectomy and XRT in 2002. She is no longer on beta blocker due to pauses.   Last seen 08/30/17 . Here with her caregiver. Lots of concerns. Has not been back as recommended due to "flu season". Dose of her Torsemide is quite unclear. Sounds like sometimes she takes total of 60 mg and sometimes she takes 40 mg. No lab since October of 2017. Does walk to the bathroom but pretty sedentary as a general rule. No chest pain. Breathing is ok per her report. No falls. Probably gets too much salt as a general rule.   Persistent a fib no anticoagulation due to age GI bleed and falls.    The patient {does/does not:200015} have symptoms concerning for COVID-19 infection (fever, chills, cough, or new shortness of breath).    Past Medical History:  Diagnosis Date  . Atrial fibrillation ([place])   . CARCINOMA, BREAST   . CHEST PAIN   . DEGENERATIVE JOINT DISEASE   . Diastolic heart failure ([place]) 08/03/2008   Qualifier: Diagnosis of  By: Burnett Kanaris    . Edema   . GIB (gastrointestinal bleeding) 05/24/2015  . HYPERTENSION   . Long term (current) use of anticoagulants   . Malignant neoplasm of female breast ([place]) 08/03/2008   Qualifier: Diagnosis of  By: Burnett Kanaris    . Unspecified diastolic heart failure    Past Surgical  History:  Procedure Laterality Date  . BREAST LUMPECTOMY    . ESOPHAGOGASTRODUODENOSCOPY (EGD) WITH PROPOFOL Left 05/28/2015   Procedure: ESOPHAGOGASTRODUODENOSCOPY (EGD) WITH PROPOFOL;  Surgeon: Wilford Corner, MD;  Location[facility] ENDOSCOPY;  Service: Endoscopy;  Laterality: Left;  . HIP SURGERY    . KNEE SURGERY    . LAMINECTOMY    . TONSILLECTOMY       No outpatient medications have been marked as taking for the 07/25/19 encounter (Appointment) with Isaiah Serge, NP.     Allergies:   Morphine and related, Sulfa antibiotics, Meperidine, Meperidine hcl, Nsaids, and Tolmetin   Social History   Tobacco Use  . Smoking status: Never Smoker  . Smokeless tobacco: Never Used  Substance Use Topics  . Alcohol use: No  . Drug use: No     Family Hx: The patient's family history includes CAD in his father and mother; Heart attack in his brother and mother; Hypertension in his mother; Stroke in his brother.  ROS:   Please see the history of present illness.    *** All other systems reviewed and are negative.   Prior CV studies:   The following studies were reviewed today:  ***  Labs/Other Tests and Data Reviewed:    EKG:  {EKG/Telemetry Strips Reviewed:[phone]}  Recent Labs: 11/11/2018: BUN 33; Creatinine, Ser 1.14; Hemoglobin 13.9; Platelets 257; Potassium 4.1; Sodium 140   Recent Lipid Panel Lab Results  Component Value Date/Time   CHOL  89 05/25/2015 05:25 AM   TRIG 195 (H) 05/25/2015 05:25 AM   HDL 21 (L) 05/25/2015 05:25 AM   CHOLHDL 4.2 05/25/2015 05:25 AM   LDLCALC 29 05/25/2015 05:25 AM   LDLDIRECT 139.5 10/25/2008 09:58 AM    Wt Readings from Last 3 Encounters:  11/11/18 220 lb (99.8 kg)  08/13/17 217 lb (98.4 kg)  06/25/16 212 lb (96.2 kg)     Objective:    Vital Signs:  There were no vitals taken for this visit.   {HeartCare Virtual Exam (Optional):[phone]::"VITAL SIGNS:  reviewed"}  ASSESSMENT & PLAN:    1. ***  COVID-19 Education: The  signs and symptoms of COVID-19 were discussed with the patient and how to seek care for testing (follow up with PCP or arrange E-visit).  ***The importance of social distancing was discussed today.  Time:   Today, I have spent *** minutes with the patient with telehealth technology discussing the above problems.     Medication Adjustments/Labs and Tests Ordered: Current medicines are reviewed at length with the patient today.  Concerns regarding medicines are outlined above.   Tests Ordered: No orders of the defined types were placed in this encounter.   Medication Changes: No orders of the defined types were placed in this encounter.   Follow Up:  {F/U Format:[phone]} {follow up:15908}  Signed, Cecilie Kicks, NP  07/24/2019 10:57 PM    [facility]

## 2019-07-25 ENCOUNTER — Telehealth: Payer: Medicare PPO | Admitting: Cardiology

## 2019-07-25 ENCOUNTER — Other Ambulatory Visit: Payer: Self-pay

## 2019-07-26 ENCOUNTER — Telehealth: Payer: Self-pay

## 2019-07-26 NOTE — Telephone Encounter (Signed)
Tried to call patient about her virtual visit for 08/01/19, no answer. Will try again later.

## 2019-07-27 NOTE — Progress Notes (Signed)
Virtual Visit via Video Note   This visit type was conducted due to national recommendations for restrictions regarding the COVID-19 Pandemic (e.g. social distancing) in an effort to limit this patient's exposure and mitigate transmission in our community.  Due to her co-morbid illnesses, this patient is at least at moderate risk for complications without adequate follow up.  This format is felt to be most appropriate for this patient at this time.  All issues noted in this document were discussed and addressed.  A limited physical exam was performed with this format.  Please refer to the patient's chart for her consent to telehealth for [facility].   Physician Location: Office Patient Location: Home   Date:  08/01/2019   [name] Date of Birth: 05/22/1922 Medical Record [id number]  PCP:  Aretta Nip, MD  Cardiologist:  Johnsie Cancel  No chief complaint on file.   History of Present Illness: [name] is a 86 y.o. adult who presents today for a follow up visit.    She has a history of permanent AF, chronic edema and prior GI bleed. She has had prior breast cancer treated with R lumpectomy and XRT in 2002. She is no longer on beta blocker due to pauses.   Still bothered with LE edema  Does walk to the bathroom but has severe inoperable left knee arthritis  Now being Rx with Norco  No chest pain. Breathing is ok per her report. No falls. Probably gets too much salt as a general rule.   Swelling continues Needs blood work Had vaccine with no issues  [place] in family since 33's Brother Max on land with her Has a caretaker 24/7  Past Medical History:  Diagnosis Date  . Atrial fibrillation ([place])   . CARCINOMA, BREAST   . CHEST PAIN   . DEGENERATIVE JOINT DISEASE   . Diastolic heart failure ([place]) 08/03/2008   Qualifier: Diagnosis of  By: Burnett Kanaris    . Edema   . GIB (gastrointestinal bleeding) 05/24/2015  . HYPERTENSION   .  Long term (current) use of anticoagulants   . Malignant neoplasm of female breast ([place]) 08/03/2008   Qualifier: Diagnosis of  By: Burnett Kanaris    . Unspecified diastolic heart failure     Past Surgical History:  Procedure Laterality Date  . BREAST LUMPECTOMY    . ESOPHAGOGASTRODUODENOSCOPY (EGD) WITH PROPOFOL Left 05/28/2015   Procedure: ESOPHAGOGASTRODUODENOSCOPY (EGD) WITH PROPOFOL;  Surgeon: Wilford Corner, MD;  Location: [facility] ENDOSCOPY;  Service: Endoscopy;  Laterality: Left;  . HIP SURGERY    . KNEE SURGERY    . LAMINECTOMY    . TONSILLECTOMY       Medications: Current Meds  Medication Sig  . calcium-vitamin D (OSCAL WITH D) 500-200 MG-UNIT per tablet Take 1 tablet by mouth daily.  . cholecalciferol (VITAMIN D) 1000 UNITS tablet Take 1,000 Units by mouth daily.  Marland Kitchen HYDROcodone-acetaminophen (NORCO) 10-325 MG tablet Take 1 tablet by mouth every 8 (eight) hours as needed.  . Misc. Devices (POSTURE SEAT) MISC Lift chair  . Multiple Vitamins-Minerals (CENTRUM PO) Take 1 tablet by mouth daily.  . Omega-3 Fatty Acids (FISH OIL PO) Take 1 tablet by mouth daily.  . pantoprazole (PROTONIX) 20 MG tablet Take 1 tablet (20 mg total) by mouth daily.  . polyethylene glycol (MIRALAX / GLYCOLAX) 17 g packet Take 17 g by mouth in the morning, at noon, and at bedtime.  . potassium chloride  SA (KLOR-CON M20) 20 MEQ tablet Take 1 tablet (20 mEq total) by mouth 2 (two) times daily. Please make overdue appt with Dr. Johnsie Cancel before anymore refills. 3rd and Final Attempt (Patient taking differently: Take 20 mEq by mouth daily. )  . torsemide (DEMADEX) 20 MG tablet Take 2 tablets (40 mg total) by mouth daily. Pt overdue for yearly appointment.  Please schedule for further refills.  Attempt x 1  . vitamin C (ASCORBIC ACID) 500 MG tablet Take 500 mg by mouth daily.     Allergies: Allergies  Allergen Reactions  . Morphine And Related Other (See Comments)    Other Reaction: mild Intolerance, Allergy   . Sulfa Antibiotics     Other reaction(s): Other (See Comments) Other Reaction: mild Intolerance, Allergy  . Meperidine Nausea And Vomiting and Other (See Comments)  . Meperidine Hcl Nausea Only  . Nsaids     Other reaction(s): Other (See Comments) Bleeding ulcer  . Tolmetin Other (See Comments)    Other reaction(s): Other (See Comments) Bleeding ulcer    Social History: The patient  reports that he has never smoked. He has never used smokeless tobacco. He reports that he does not drink alcohol or use drugs.   Family History: The patient's family history includes CAD in his father and mother; Heart attack in his brother and mother; Hypertension in his mother; Stroke in his brother.   Review of Systems: Please see the history of present illness.   Otherwise, the review of systems is positive for none.   All other systems are reviewed and negative.   Physical Exam: VS:  BP (!) 146/70   Ht 5\' 8"  (1.727 m)   Wt 235 lb (106.6 kg)   BMI 35.73 kg/m  .  BMI Body mass index is 35.73 kg/m.  Wt Readings from Last 3 Encounters:  08/01/19 235 lb (106.6 kg)  11/11/18 220 lb (99.8 kg)  08/13/17 217 lb (98.4 kg)    Telephone no exam   LABORATORY DATA:  EKG:   Flutter rate 48 LVH inferior lateral T wave changes 11/11/18   Lab Results  Component Value Date   WBC 9.2 11/11/2018   HGB 13.9 11/11/2018   HCT 43.3 11/11/2018   PLT 257 11/11/2018   GLUCOSE 90 11/11/2018   CHOL 89 05/25/2015   TRIG 195 (H) 05/25/2015   HDL 21 (L) 05/25/2015   LDLDIRECT 139.5 10/25/2008   LDLCALC 29 05/25/2015   ALT 16 05/24/2015   AST 26 05/24/2015   NA 140 11/11/2018   K 4.1 11/11/2018   CL 105 11/11/2018   CREATININE 1.14 (H) 11/11/2018   BUN 33 (H) 11/11/2018   CO2 26 11/11/2018   TSH 4.32 11/13/2015   INR 4.1 06/24/2015   HGBA1C 5.9 (H) 05/25/2015     BNP (last 3 results) No results for input(s): BNP in the last 8760 hours.  ProBNP (last 3 results) No results for input(s): PROBNP  in the last 8760 hours.   Other Studies Reviewed Today:  Echo Study Conclusions 2015  - Left ventricle: The cavity size was normal. Wall thickness was increased in a pattern of mild LVH. Systolic function was normal. The estimated ejection fraction was in the range of 60% to 65%. Wall motion was normal; there were no regional wall motion abnormalities. - Aortic valve: There was trivial regurgitation. - Left atrium: The atrium was severely dilated.  Assessment/Plan:  1. Pesistent atrial fibrillation:  CHASDSVASC at least 5. Coumadin stopped In April 2017 due  to GI bleed, age and history of falls. Her rate is currently ok. She has had AV nodal disease - no longer on beta blocker.     2. Chronic LE edema: probably getting too much salt. Not able to use support stockings. Continue diuretics will get blood work and consider increasing diuretic if possible   3. HTN: BP fair on her current regimen  4. Prior GI bleeding she has had prior EGD showing antral ulcers due to NSAIDs. She is no longer on anticoagulation and no longer felt to be a satisfactory candidate for anticoagulation.   5. Ortho:  Chronic left knee arthritis currently Rx Norco limits activity    Current medicines are reviewed with the patient today.  The patient does not have concerns regarding medicines other than what has been noted above.  The following changes have been made:  See above.  Labs/ tests ordered today include:   CBC/BMET    Disposition:   FU in 6 months  Patient is agreeable to this plan and will call if any problems develop in the interim.   Signed: Jenkins Rouge, MD  08/01/2019 8:52 AM  [facility] [address] [place], [place]  [address] Phone: [phone] Fax: [phone]

## 2019-08-01 ENCOUNTER — Telehealth (INDEPENDENT_AMBULATORY_CARE_PROVIDER_SITE_OTHER): Payer: Medicare PPO | Admitting: Cardiovascular Disease

## 2019-08-01 ENCOUNTER — Other Ambulatory Visit: Payer: Self-pay

## 2019-08-01 VITALS — BP 146/70 | Ht 68.0 in | Wt 235.0 lb

## 2019-08-01 DIAGNOSIS — I4811 Longstanding persistent atrial fibrillation: Secondary | ICD-10-CM

## 2019-08-01 DIAGNOSIS — R609 Edema, unspecified: Secondary | ICD-10-CM

## 2019-08-01 NOTE — Telephone Encounter (Signed)
  Patient Consent for Virtual Visit         [name] has provided verbal consent on 08/01/2019 for a virtual visit (video or telephone).   CONSENT FOR VIRTUAL VISIT FOR:  [name]  By participating in this virtual visit I agree to the following:  I hereby voluntarily request, consent and authorize [facility] and its employed or contracted physicians, physician assistants, nurse practitioners or other licensed health care professionals (the Practitioner), to provide me with telemedicine health care services (the "Services") as deemed necessary by the treating Practitioner. I acknowledge and consent to receive the Services by the Practitioner via telemedicine. I understand that the telemedicine visit will involve communicating with the Practitioner through live audiovisual communication technology and the disclosure of certain medical information by electronic transmission. I acknowledge that I have been given the opportunity to request an in-person assessment or other available alternative prior to the telemedicine visit and am voluntarily participating in the telemedicine visit.  I understand that I have the right to withhold or withdraw my consent to the use of telemedicine in the course of my care at any time, without affecting my right to future care or treatment, and that the Practitioner or I may terminate the telemedicine visit at any time. I understand that I have the right to inspect all information obtained and/or recorded in the course of the telemedicine visit and may receive copies of available information for a reasonable fee.  I understand that some of the potential risks of receiving the Services via telemedicine include:  Marland Kitchen Delay or interruption in medical evaluation due to technological equipment failure or disruption; . Information transmitted may not be sufficient (e.g. poor resolution of images) to allow for appropriate medical decision making by the  Practitioner; and/or  . In rare instances, security protocols could fail, causing a breach of personal health information.  Furthermore, I acknowledge that it is my responsibility to provide information about my medical history, conditions and care that is complete and accurate to the best of my ability. I acknowledge that Practitioner's advice, recommendations, and/or decision may be based on factors not within their control, such as incomplete or inaccurate data provided by me or distortions of diagnostic images or specimens that may result from electronic transmissions. I understand that the practice of medicine is not an exact science and that Practitioner makes no warranties or guarantees regarding treatment outcomes. I acknowledge that a copy of this consent can be made available to me via my patient portal ([facility]), or I can request a printed copy by calling the office of [facility].    I understand that my insurance will be billed for this visit.   I have read or had this consent read to me. . I understand the contents of this consent, which adequately explains the benefits and risks of the Services being provided via telemedicine.  . I have been provided ample opportunity to ask questions regarding this consent and the Services and have had my questions answered to my satisfaction. . I give my informed consent for the services to be provided through the use of telemedicine in my medical care

## 2019-08-01 NOTE — Patient Instructions (Addendum)
Medication Instructions:  *If you need a refill on your cardiac medications before your next appointment, please call your pharmacy*  Lab Work: Your physician recommends that you return for lab work this week for BMET and CBC.  If you have labs (blood work) drawn today and your tests are completely normal, you will receive your results only by: Marland Kitchen MyChart Message (if you have MyChart) OR . A paper copy in the mail If you have any lab test that is abnormal or we need to change your treatment, we will call you to review the results.   Testing/Procedures: None ordered today.  Follow-Up: At [facility], you and your health needs are our priority.  As part of our continuing mission to provide you with exceptional heart care, we have created designated Provider Care Teams.  These Care Teams include your primary Cardiologist (physician) and Advanced Practice Providers (APPs -  Physician Assistants and Nurse Practitioners) who all work together to provide you with the care you need, when you need it.  We recommend signing up for the patient portal called "MyChart".  Sign up information is provided on this After Visit Summary.  MyChart is used to connect with patients for Virtual Visits (Telemedicine).  Patients are able to view lab/test results, encounter notes, upcoming appointments, etc.  Non-urgent messages can be sent to your provider as well.   To learn more about what you can do with MyChart, go to [email or web address].    Your next appointment:   6 month(s)  The format for your next appointment:   In Person  Provider:   You may see Dr. Johnsie Cancel or one of the following Advanced Practice Providers on your designated Care Team:    Truitt Merle, NP  Cecilie Kicks, NP  Kathyrn Drown, NP

## 2019-08-15 ENCOUNTER — Other Ambulatory Visit: Payer: Self-pay | Admitting: *Deleted

## 2019-08-15 DIAGNOSIS — M1712 Unilateral primary osteoarthritis, left knee: Secondary | ICD-10-CM

## 2019-08-15 MED ORDER — HYDROCODONE-ACETAMINOPHEN 10-325 MG PO TABS: 1.0000 | ORAL_TABLET | Freq: Three times a day (TID) | ORAL | 0 refills | Status: DC | PRN

## 2019-09-17 ENCOUNTER — Other Ambulatory Visit: Payer: Self-pay | Admitting: Nurse Practitioner

## 2019-10-24 ENCOUNTER — Other Ambulatory Visit: Payer: Self-pay | Admitting: *Deleted

## 2019-10-24 DIAGNOSIS — M1712 Unilateral primary osteoarthritis, left knee: Secondary | ICD-10-CM

## 2019-10-24 MED ORDER — HYDROCODONE-ACETAMINOPHEN 10-325 MG PO TABS: 1.0000 | ORAL_TABLET | Freq: Three times a day (TID) | ORAL | 0 refills | Status: DC | PRN

## 2019-12-08 ENCOUNTER — Other Ambulatory Visit: Payer: Self-pay | Admitting: *Deleted

## 2019-12-08 DIAGNOSIS — M1712 Unilateral primary osteoarthritis, left knee: Secondary | ICD-10-CM

## 2019-12-08 MED ORDER — HYDROCODONE-ACETAMINOPHEN 10-325 MG PO TABS: 1.0000 | ORAL_TABLET | Freq: Three times a day (TID) | ORAL | 0 refills | Status: DC | PRN

## 2020-01-19 ENCOUNTER — Other Ambulatory Visit: Payer: Self-pay

## 2020-01-19 DIAGNOSIS — M1712 Unilateral primary osteoarthritis, left knee: Secondary | ICD-10-CM

## 2020-01-19 MED ORDER — POSTURE SEAT MISC: 0 refills | Status: DC

## 2020-01-22 MED ORDER — HYDROCODONE-ACETAMINOPHEN 10-325 MG PO TABS: 1.0000 | ORAL_TABLET | Freq: Three times a day (TID) | ORAL | 0 refills | Status: DC | PRN

## 2020-01-22 NOTE — Telephone Encounter (Signed)
Cloyde Reams called requesting a refill on patient's hydrocodone be sent to [facility] [employer] at [facility].  Prescription pending approval.

## 2020-01-23 ENCOUNTER — Telehealth: Payer: Self-pay

## 2020-01-23 DIAGNOSIS — M1712 Unilateral primary osteoarthritis, left knee: Secondary | ICD-10-CM

## 2020-01-23 NOTE — Telephone Encounter (Signed)
Patient's refill for hydrocodone was sent to [place] and should have been sent to [facility] [place].  Please correct.

## 2020-01-24 MED ORDER — HYDROCODONE-ACETAMINOPHEN 10-325 MG PO TABS: 1.0000 | ORAL_TABLET | Freq: Three times a day (TID) | ORAL | 0 refills | Status: DC | PRN

## 2020-01-24 NOTE — Telephone Encounter (Signed)
Resent

## 2020-01-26 ENCOUNTER — Other Ambulatory Visit: Payer: Self-pay | Admitting: Cardiovascular Disease

## 2020-02-05 NOTE — Progress Notes (Incomplete)
Virtual Visit via Video Note   This visit type was conducted due to national recommendations for restrictions regarding the COVID-19 Pandemic (e.g. social distancing) in an effort to limit this patient's exposure and mitigate transmission in our community.  Due to her co-morbid illnesses, this patient is at least at moderate risk for complications without adequate follow up.  This format is felt to be most appropriate for this patient at this time.  All issues noted in this document were discussed and addressed.  A limited physical exam was performed with this format.  Please refer to the patient's chart for her consent to telehealth for [facility].   Physician Location: Office Patient Location: Home   Date:  02/05/2020   [name] Date of Birth: 11-11-1922 Medical Record #[id number]  PCP:  Aretta Nip, MD  Cardiologist:  Johnsie Cancel  No chief complaint on file.   History of Present Illness: [name] is a 86 y.o. female who presents today for a follow up visit.    She has a history of permanent AF, chronic edema and prior GI bleed. She has had prior breast cancer treated with R lumpectomy and XRT in 2002. She is no longer on beta blocker due to pauses.   Inoperable left knee arthritis on Norco  Lack of activity contributes to edema   Had vaccine with no issues  [place] land that has been in family since 70's Brother Max on land with her Has a caretaker 24/7  ***  Past Medical History:  Diagnosis Date  . Atrial fibrillation ([place])   . CARCINOMA, BREAST   . CHEST PAIN   . DEGENERATIVE JOINT DISEASE   . Diastolic heart failure ([place]) 08/03/2008   Qualifier: Diagnosis of  By: Burnett Kanaris    . Edema   . GIB (gastrointestinal bleeding) 05/24/2015  . HYPERTENSION   . Long term (current) use of anticoagulants   . Malignant neoplasm of female breast ([place]) 08/03/2008   Qualifier: Diagnosis of  By: Burnett Kanaris    . Unspecified diastolic heart  failure     Past Surgical History:  Procedure Laterality Date  . BREAST LUMPECTOMY    . ESOPHAGOGASTRODUODENOSCOPY (EGD) WITH PROPOFOL Left 05/28/2015   Procedure: ESOPHAGOGASTRODUODENOSCOPY (EGD) WITH PROPOFOL;  Surgeon: Wilford Corner, MD;  Location: [facility] ENDOSCOPY;  Service: Endoscopy;  Laterality: Left;  . HIP SURGERY    . KNEE SURGERY    . LAMINECTOMY    . TONSILLECTOMY       Medications: No outpatient medications have been marked as taking for the 02/07/20 encounter (Appointment) with Josue Hector, MD.     Allergies: Allergies  Allergen Reactions  . Morphine And Related Other (See Comments)    Other Reaction: mild Intolerance, Allergy  . Sulfa Antibiotics     Other reaction(s): Other (See Comments) Other Reaction: mild Intolerance, Allergy  . Meperidine Nausea And Vomiting and Other (See Comments)  . Meperidine Hcl Nausea Only  . Nsaids     Other reaction(s): Other (See Comments) Bleeding ulcer  . Tolmetin Other (See Comments)    Other reaction(s): Other (See Comments) Bleeding ulcer    Social History: The patient  reports that she has never smoked. She has never used smokeless tobacco. She reports that she does not drink alcohol and does not use drugs.   Family History: The patient's family history includes CAD in her father and mother; Heart attack in her brother and mother; Hypertension in her mother; Stroke  in her brother.   Review of Systems: Please see the history of present illness.   Otherwise, the review of systems is positive for none.   All other systems are reviewed and negative.   Physical Exam: VS:  There were no vitals taken for this visit. Marland Kitchen  BMI There is no height or weight on file to calculate BMI.  Wt Readings from Last 3 Encounters:  08/01/19 106.6 kg  11/11/18 99.8 kg  08/13/17 98.4 kg    Telephone no exam   LABORATORY DATA:  EKG:   Flutter rate 48 LVH inferior lateral T wave changes 11/11/18   Lab Results  Component Value Date    WBC 9.2 11/11/2018   HGB 13.9 11/11/2018   HCT 43.3 11/11/2018   PLT 257 11/11/2018   GLUCOSE 90 11/11/2018   CHOL 89 05/25/2015   TRIG 195 (H) 05/25/2015   HDL 21 (L) 05/25/2015   LDLDIRECT 139.5 10/25/2008   LDLCALC 29 05/25/2015   ALT 16 05/24/2015   AST 26 05/24/2015   NA 140 11/11/2018   K 4.1 11/11/2018   CL 105 11/11/2018   CREATININE 1.14 (H) 11/11/2018   BUN 33 (H) 11/11/2018   CO2 26 11/11/2018   TSH 4.32 11/13/2015   INR 4.1 06/24/2015   HGBA1C 5.9 (H) 05/25/2015     BNP (last 3 results) No results for input(s): BNP in the last 8760 hours.  ProBNP (last 3 results) No results for input(s): PROBNP in the last 8760 hours.   Other Studies Reviewed Today:  Echo Study Conclusions 2015  - Left ventricle: The cavity size was normal. Wall thickness was increased in a pattern of mild LVH. Systolic function was normal. The estimated ejection fraction was in the range of 60% to 65%. Wall motion was normal; there were no regional wall motion abnormalities. - Aortic valve: There was trivial regurgitation. - Left atrium: The atrium was severely dilated.  Assessment/Plan:  1. Pesistent atrial fibrillation:  CHASDSVASC at least 5. Coumadin stopped In April 2017 due to GI bleed, age and history of falls. Beta blocker d/c due to AV nodal dx rates ok   2. Chronic LE edema: continue diuretic and elevation when possible low sodium diet   3. HTN: Well controlled.  Continue current medications and low sodium Dash type diet.    4. Prior GI bleeding she has had prior EGD showing antral ulcers due to NSAIDs. No anticoagulation   5. Ortho:  Chronic left knee arthritis currently Rx Norco limits activity    Current medicines are reviewed with the patient today.  The patient does not have concerns regarding medicines other than what has been noted above.  The following changes have been made:  See above.  Labs/ tests ordered today include:   CBC/BMET     Disposition:   FU in 6 months  Patient is agreeable to this plan and will call if any problems develop in the interim.   Signed: Jenkins Rouge, MD  02/05/2020 12:57 PM  [facility] [address] [place], [place]  [address] Phone: [phone] Fax: [phone]

## 2020-02-07 ENCOUNTER — Other Ambulatory Visit: Payer: Self-pay

## 2020-02-07 ENCOUNTER — Telehealth: Payer: Medicare PPO | Admitting: Cardiovascular Disease

## 2020-02-09 NOTE — Progress Notes (Signed)
Virtual Visit via Video Note   This visit type was conducted due to national recommendations for restrictions regarding the COVID-19 Pandemic (e.g. social distancing) in an effort to limit this patient's exposure and mitigate transmission in our community.  Due to her co-morbid illnesses, this patient is at least at moderate risk for complications without adequate follow up.  This format is felt to be most appropriate for this patient at this time.  All issues noted in this document were discussed and addressed.  A limited physical exam was performed with this format.  Please refer to the patient's chart for her consent to telehealth for [facility].   Physician Location: Office Patient Location: Home   Date:  02/16/2020   [name] Date of Birth: May 05, 1922 Medical Record #[id number]  PCP:  Aretta Nip, MD  Cardiologist:  Johnsie Cancel   History of Present Illness: [name] is a 86 y.o. female who presents today for a follow up visit.    She has a history of permanent AF, chronic edema and prior GI bleed. She has had prior breast cancer treated with R lumpectomy and XRT in 2002. She is no longer on beta blocker due to pauses.   Inoperable left knee arthritis on Norco  Lack of activity contributes to edema   Had vaccine with no issues  [place]ives [place] land that has been in family since 36's Brother Max on land with her Has a caretaker 24/7  She has not left house since February Still with edema, coughing and wheezing Has had inhaler in past Went to [place] urgent care COVID negative going to get A CXR   Past Medical History:  Diagnosis Date  . Atrial fibrillation ([place])   . CARCINOMA, BREAST   . CHEST PAIN   . DEGENERATIVE JOINT DISEASE   . Diastolic heart failure ([place]) 08/03/2008   Qualifier: Diagnosis of  By: Burnett Kanaris    . Edema   . GIB (gastrointestinal bleeding) 05/24/2015  . HYPERTENSION   . Long term (current) use of anticoagulants   .  Malignant neoplasm of female breast ([place]) 08/03/2008   Qualifier: Diagnosis of  By: Burnett Kanaris    . Unspecified diastolic heart failure     Past Surgical History:  Procedure Laterality Date  . BREAST LUMPECTOMY    . ESOPHAGOGASTRODUODENOSCOPY (EGD) WITH PROPOFOL Left 05/28/2015   Procedure: ESOPHAGOGASTRODUODENOSCOPY (EGD) WITH PROPOFOL;  Surgeon: Wilford Corner, MD;  Location: [facility] ENDOSCOPY;  Service: Endoscopy;  Laterality: Left;  . HIP SURGERY    . KNEE SURGERY    . LAMINECTOMY    . TONSILLECTOMY       Medications: Current Meds  Medication Sig  . calcium-vitamin D (OSCAL WITH D) 500-200 MG-UNIT per tablet Take 1 tablet by mouth daily.  . cholecalciferol (VITAMIN D) 1000 UNITS tablet Take 1,000 Units by mouth daily.  Marland Kitchen HYDROcodone-acetaminophen (NORCO) 5-325 MG tablet 1 tablet  . KLOR-CON M20 20 MEQ tablet TAKE 1 TABLET BY MOUTH 2 (TWO) TIMES DAILY.  . Misc. Devices (POSTURE SEAT) MISC Lift chair  . Multiple Vitamins-Minerals (CENTRUM PO) Take 1 tablet by mouth daily.  . Omega-3 Fatty Acids (FISH OIL PO) Take 1 tablet by mouth daily.  . polyethylene glycol (MIRALAX / GLYCOLAX) 17 g packet Take 17 g by mouth in the morning, at noon, and at bedtime.  . torsemide (DEMADEX) 20 MG tablet TAKE 2 TABLETS (40 MG TOTAL) BY MOUTH DAILY. PT OVERDUE FOR YEARLY APPOINTMENT. PLEASE SCHEDULE  FOR FURTHER REFILLS. ATTEMPT X 1  . vitamin C (ASCORBIC ACID) 500 MG tablet Take 500 mg by mouth daily.  . [DISCONTINUED] HYDROcodone-acetaminophen (NORCO) 10-325 MG tablet Take 1 tablet by mouth every 8 (eight) hours as needed.     Allergies: Allergies  Allergen Reactions  . Morphine And Related Other (See Comments)    Other Reaction: mild Intolerance, Allergy  . Sulfa Antibiotics     Other reaction(s): Other (See Comments) Other Reaction: mild Intolerance, Allergy  . Meperidine Nausea And Vomiting and Other (See Comments)  . Meperidine Hcl Nausea Only  . Nsaids     Other reaction(s): Other  (See Comments) Bleeding ulcer  . Tolmetin Other (See Comments)    Other reaction(s): Other (See Comments) Bleeding ulcer    Social History: The patient  reports that she has never smoked. She has never used smokeless tobacco. She reports that she does not drink alcohol and does not use drugs.   Family History: The patient's family history includes CAD in her father and mother; Heart attack in her brother and mother; Hypertension in her mother; Stroke in her brother.   Review of Systems: Please see the history of present illness.   Otherwise, the review of systems is positive for none.   All other systems are reviewed and negative.   Physical Exam: VS:  BP 130/60   Pulse (!) 55   Ht 5\' 8"  (1.727 m)   Wt 104.3 kg   BMI 34.97 kg/m  .  BMI Body mass index is 34.97 kg/m.  Wt Readings from Last 3 Encounters:  02/16/20 104.3 kg  08/01/19 106.6 kg  11/11/18 99.8 kg    Telephone no exam   LABORATORY DATA:  EKG:   Flutter rate 48 LVH inferior lateral T wave changes 11/11/18   Lab Results  Component Value Date   WBC 9.2 11/11/2018   HGB 13.9 11/11/2018   HCT 43.3 11/11/2018   PLT 257 11/11/2018   GLUCOSE 90 11/11/2018   CHOL 89 05/25/2015   TRIG 195 (H) 05/25/2015   HDL 21 (L) 05/25/2015   LDLDIRECT 139.5 10/25/2008   LDLCALC 29 05/25/2015   ALT 16 05/24/2015   AST 26 05/24/2015   NA 140 11/11/2018   K 4.1 11/11/2018   CL 105 11/11/2018   CREATININE 1.14 (H) 11/11/2018   BUN 33 (H) 11/11/2018   CO2 26 11/11/2018   TSH 4.32 11/13/2015   INR 4.1 06/24/2015   HGBA1C 5.9 (H) 05/25/2015     BNP (last 3 results) No results for input(s): BNP in the last 8760 hours.  ProBNP (last 3 results) No results for input(s): PROBNP in the last 8760 hours.   Other Studies Reviewed Today:  Echo Study Conclusions 2015  - Left ventricle: The cavity size was normal. Wall thickness was increased in a pattern of mild LVH. Systolic function was normal. The estimated ejection  fraction was in the range of 60% to 65%. Wall motion was normal; there were no regional wall motion abnormalities. - Aortic valve: There was trivial regurgitation. - Left atrium: The atrium was severely dilated.  Assessment/Plan:  1. Pesistent atrial fibrillation:  CHASDSVASC at least 5. Coumadin stopped In April 2017 due to GI bleed, age and history of falls. Beta blocker d/c due to AV nodal dx rates ok  Given age would not pursue [facility] FLX evaluation   2. Chronic LE edema: continue diuretic and elevation when possible [name] sodium diet   3. HTN: Well controlled.  Continue  current medications and [name] sodium Dash type diet.    4. Prior GI bleeding she has had prior EGD showing antral ulcers due to NSAIDs. No anticoagulation   5. Ortho:  Chronic left knee arthritis currently Rx Norco limits activity    Current medicines are reviewed with the patient today.  The patient does not have concerns regarding medicines other than what has been noted above.  The following changes have been made:  See above.  Labs/ tests ordered today include:   None   Time :; spent reviewing chart prior echo direct patient interview and composing note 20 minutes  Disposition:   FU in 6 months  Patient is agreeable to this plan and will call if any problems develop in the interim.   Signed: Jenkins Rouge, MD  02/16/2020 9:06 AM  [facility] [address] [place], [place]  [address] Phone: [phone] Fax: [phone]

## 2020-02-14 DIAGNOSIS — J40 Bronchitis, not specified as acute or chronic: Secondary | ICD-10-CM | POA: Diagnosis not present

## 2020-02-14 DIAGNOSIS — Z03818 Encounter for observation for suspected exposure to other biological agents ruled out: Secondary | ICD-10-CM | POA: Diagnosis not present

## 2020-02-14 DIAGNOSIS — R062 Wheezing: Secondary | ICD-10-CM | POA: Diagnosis not present

## 2020-02-14 DIAGNOSIS — R609 Edema, unspecified: Secondary | ICD-10-CM | POA: Diagnosis not present

## 2020-02-14 DIAGNOSIS — R059 Cough, unspecified: Secondary | ICD-10-CM | POA: Diagnosis not present

## 2020-02-16 ENCOUNTER — Telehealth (INDEPENDENT_AMBULATORY_CARE_PROVIDER_SITE_OTHER): Payer: Medicare PPO | Admitting: Cardiovascular Disease

## 2020-02-16 ENCOUNTER — Other Ambulatory Visit: Payer: Self-pay

## 2020-02-16 DIAGNOSIS — I482 Chronic atrial fibrillation, unspecified: Secondary | ICD-10-CM

## 2020-02-16 NOTE — Patient Instructions (Signed)
Medication Instructions:  *If you need a refill on your cardiac medications before your next appointment, please call your pharmacy*  Lab Work: If you have labs (blood work) drawn today and your tests are completely normal, you will receive your results only by: . MyChart Message (if you have MyChart) OR . A paper copy in the mail If you have any lab test that is abnormal or we need to change your treatment, we will call you to review the results.  Follow-Up: At [facility], you and your health needs are our priority.  As part of our continuing mission to provide you with exceptional heart care, we have created designated Provider Care Teams.  These Care Teams include your primary Cardiologist (physician) and Advanced Practice Providers (APPs -  Physician Assistants and Nurse Practitioners) who all work together to provide you with the care you need, when you need it.  We recommend signing up for the patient portal called "MyChart".  Sign up information is provided on this After Visit Summary.  MyChart is used to connect with patients for Virtual Visits (Telemedicine).  Patients are able to view lab/test results, encounter notes, upcoming appointments, etc.  Non-urgent messages can be sent to your provider as well.   To learn more about what you can do with MyChart, go to [email or web address].    Your next appointment:   6 month(s)  The format for your next appointment:   In Person  Provider:   You may see Peter Nishan, MD or one of the following Advanced Practice Providers on your designated Care Team:    Lori Gerhardt, NP  Laura Ingold, NP  Jill McDaniel, NP    

## 2020-03-26 DIAGNOSIS — D509 Iron deficiency anemia, unspecified: Secondary | ICD-10-CM | POA: Diagnosis not present

## 2020-03-26 DIAGNOSIS — I10 Essential (primary) hypertension: Secondary | ICD-10-CM | POA: Diagnosis not present

## 2020-03-26 DIAGNOSIS — R899 Unspecified abnormal finding in specimens from other organs, systems and tissues: Secondary | ICD-10-CM | POA: Diagnosis not present

## 2020-03-26 DIAGNOSIS — I482 Chronic atrial fibrillation, unspecified: Secondary | ICD-10-CM | POA: Diagnosis not present

## 2020-03-26 DIAGNOSIS — R609 Edema, unspecified: Secondary | ICD-10-CM | POA: Diagnosis not present

## 2020-03-26 DIAGNOSIS — R059 Cough, unspecified: Secondary | ICD-10-CM | POA: Diagnosis not present

## 2020-04-02 ENCOUNTER — Other Ambulatory Visit: Payer: Self-pay

## 2020-04-02 MED ORDER — HYDROCODONE-ACETAMINOPHEN 5-325 MG PO TABS: 1.0000 | ORAL_TABLET | Freq: Every day | ORAL | 0 refills | Status: DC | PRN

## 2020-04-02 NOTE — Telephone Encounter (Signed)
Patient aware refill was sent in °

## 2020-04-02 NOTE — Telephone Encounter (Signed)
Patient called for a refill on her hydrocodone. Prescription pended for your approval.  It looks like it needs new Sig. Pharmacy verified.

## 2020-04-23 DIAGNOSIS — K219 Gastro-esophageal reflux disease without esophagitis: Secondary | ICD-10-CM | POA: Diagnosis not present

## 2020-04-23 DIAGNOSIS — R946 Abnormal results of thyroid function studies: Secondary | ICD-10-CM | POA: Diagnosis not present

## 2020-04-23 DIAGNOSIS — R7989 Other specified abnormal findings of blood chemistry: Secondary | ICD-10-CM | POA: Diagnosis not present

## 2020-04-29 ENCOUNTER — Other Ambulatory Visit: Payer: Self-pay

## 2020-04-29 MED ORDER — HYDROCODONE-ACETAMINOPHEN 5-325 MG PO TABS: 1.0000 | ORAL_TABLET | Freq: Every day | ORAL | 0 refills | Status: DC | PRN

## 2020-04-29 NOTE — Telephone Encounter (Signed)
Patient aware prescription was sent in to pharmacy.

## 2020-05-03 ENCOUNTER — Other Ambulatory Visit: Payer: Self-pay | Admitting: Sports Medicine

## 2020-05-03 DIAGNOSIS — M1712 Unilateral primary osteoarthritis, left knee: Secondary | ICD-10-CM

## 2020-05-03 MED ORDER — HYDROCODONE-ACETAMINOPHEN 10-325 MG PO TABS: 1.0000 | ORAL_TABLET | Freq: Three times a day (TID) | ORAL | 0 refills | Status: DC | PRN

## 2020-05-21 DIAGNOSIS — R54 Age-related physical debility: Secondary | ICD-10-CM | POA: Diagnosis not present

## 2020-05-21 DIAGNOSIS — R600 Localized edema: Secondary | ICD-10-CM | POA: Diagnosis not present

## 2020-05-21 DIAGNOSIS — I4891 Unspecified atrial fibrillation: Secondary | ICD-10-CM | POA: Diagnosis not present

## 2020-05-21 DIAGNOSIS — R946 Abnormal results of thyroid function studies: Secondary | ICD-10-CM | POA: Diagnosis not present

## 2020-05-21 DIAGNOSIS — E063 Autoimmune thyroiditis: Secondary | ICD-10-CM | POA: Diagnosis not present

## 2020-05-21 DIAGNOSIS — R531 Weakness: Secondary | ICD-10-CM | POA: Diagnosis not present

## 2020-07-01 ENCOUNTER — Other Ambulatory Visit: Payer: Self-pay

## 2020-07-01 DIAGNOSIS — M1712 Unilateral primary osteoarthritis, left knee: Secondary | ICD-10-CM

## 2020-07-01 MED ORDER — HYDROCODONE-ACETAMINOPHEN 10-325 MG PO TABS: 1.0000 | ORAL_TABLET | Freq: Three times a day (TID) | ORAL | 0 refills | Status: DC | PRN

## 2020-07-01 NOTE — Telephone Encounter (Signed)
Jan Wood left message requesting refill on patient's hydrocodone be sent in to the pharmacy.

## 2020-09-12 ENCOUNTER — Emergency Department (HOSPITAL_COMMUNITY): Payer: Medicare PPO

## 2020-09-12 ENCOUNTER — Observation Stay (HOSPITAL_COMMUNITY): Payer: Medicare PPO

## 2020-09-12 ENCOUNTER — Encounter (HOSPITAL_COMMUNITY): Payer: Self-pay

## 2020-09-12 ENCOUNTER — Other Ambulatory Visit: Payer: Self-pay

## 2020-09-12 ENCOUNTER — Inpatient Hospital Stay (HOSPITAL_COMMUNITY)
Admission: EM | Admit: 2020-09-12 | Discharge: 2020-09-24 | DRG: 563 | Disposition: A | Discharge status: Skilled Nursing Facility | Payer: Medicare PPO | Attending: Family Medicine | Admitting: Family Medicine

## 2020-09-12 DIAGNOSIS — M1712 Unilateral primary osteoarthritis, left knee: Secondary | ICD-10-CM | POA: Diagnosis present

## 2020-09-12 DIAGNOSIS — M810 Age-related osteoporosis without current pathological fracture: Secondary | ICD-10-CM | POA: Diagnosis not present

## 2020-09-12 DIAGNOSIS — Z96651 Presence of right artificial knee joint: Secondary | ICD-10-CM | POA: Diagnosis present

## 2020-09-12 DIAGNOSIS — W19XXXA Unspecified fall, initial encounter: Secondary | ICD-10-CM

## 2020-09-12 DIAGNOSIS — S42341A Displaced spiral fracture of shaft of humerus, right arm, initial encounter for closed fracture: Secondary | ICD-10-CM | POA: Diagnosis present

## 2020-09-12 DIAGNOSIS — Z9119 Patient's noncompliance with other medical treatment and regimen: Secondary | ICD-10-CM | POA: Diagnosis not present

## 2020-09-12 DIAGNOSIS — Z888 Allergy status to other drugs, medicaments and biological substances status: Secondary | ICD-10-CM | POA: Diagnosis not present

## 2020-09-12 DIAGNOSIS — E876 Hypokalemia: Secondary | ICD-10-CM | POA: Diagnosis present

## 2020-09-12 DIAGNOSIS — S42294A Other nondisplaced fracture of upper end of right humerus, initial encounter for closed fracture: Secondary | ICD-10-CM | POA: Diagnosis not present

## 2020-09-12 DIAGNOSIS — Z823 Family history of stroke: Secondary | ICD-10-CM

## 2020-09-12 DIAGNOSIS — R609 Edema, unspecified: Secondary | ICD-10-CM | POA: Diagnosis not present

## 2020-09-12 DIAGNOSIS — S42331A Displaced oblique fracture of shaft of humerus, right arm, initial encounter for closed fracture: Secondary | ICD-10-CM | POA: Diagnosis not present

## 2020-09-12 DIAGNOSIS — M4805 Spinal stenosis, thoracolumbar region: Secondary | ICD-10-CM | POA: Diagnosis not present

## 2020-09-12 DIAGNOSIS — K5900 Constipation, unspecified: Secondary | ICD-10-CM | POA: Diagnosis present

## 2020-09-12 DIAGNOSIS — I5032 Chronic diastolic (congestive) heart failure: Secondary | ICD-10-CM | POA: Diagnosis present

## 2020-09-12 DIAGNOSIS — Z20822 Contact with and (suspected) exposure to covid-19: Secondary | ICD-10-CM | POA: Diagnosis present

## 2020-09-12 DIAGNOSIS — I4819 Other persistent atrial fibrillation: Secondary | ICD-10-CM | POA: Diagnosis not present

## 2020-09-12 DIAGNOSIS — S42309A Unspecified fracture of shaft of humerus, unspecified arm, initial encounter for closed fracture: Secondary | ICD-10-CM

## 2020-09-12 DIAGNOSIS — Z7901 Long term (current) use of anticoagulants: Secondary | ICD-10-CM | POA: Diagnosis not present

## 2020-09-12 DIAGNOSIS — Z885 Allergy status to narcotic agent status: Secondary | ICD-10-CM | POA: Diagnosis not present

## 2020-09-12 DIAGNOSIS — R0902 Hypoxemia: Secondary | ICD-10-CM | POA: Diagnosis not present

## 2020-09-12 DIAGNOSIS — Z853 Personal history of malignant neoplasm of breast: Secondary | ICD-10-CM | POA: Diagnosis not present

## 2020-09-12 DIAGNOSIS — I110 Hypertensive heart disease with heart failure: Secondary | ICD-10-CM | POA: Diagnosis present

## 2020-09-12 DIAGNOSIS — W1830XA Fall on same level, unspecified, initial encounter: Secondary | ICD-10-CM | POA: Diagnosis present

## 2020-09-12 DIAGNOSIS — I10 Essential (primary) hypertension: Secondary | ICD-10-CM | POA: Diagnosis not present

## 2020-09-12 DIAGNOSIS — M25552 Pain in left hip: Secondary | ICD-10-CM | POA: Diagnosis not present

## 2020-09-12 DIAGNOSIS — M179 Osteoarthritis of knee, unspecified: Secondary | ICD-10-CM | POA: Diagnosis not present

## 2020-09-12 DIAGNOSIS — R001 Bradycardia, unspecified: Secondary | ICD-10-CM | POA: Diagnosis present

## 2020-09-12 DIAGNOSIS — M19011 Primary osteoarthritis, right shoulder: Secondary | ICD-10-CM | POA: Diagnosis not present

## 2020-09-12 DIAGNOSIS — Z79899 Other long term (current) drug therapy: Secondary | ICD-10-CM

## 2020-09-12 DIAGNOSIS — Z743 Need for continuous supervision: Secondary | ICD-10-CM | POA: Diagnosis not present

## 2020-09-12 DIAGNOSIS — I4891 Unspecified atrial fibrillation: Secondary | ICD-10-CM | POA: Diagnosis present

## 2020-09-12 DIAGNOSIS — Y92009 Unspecified place in unspecified non-institutional (private) residence as the place of occurrence of the external cause: Secondary | ICD-10-CM

## 2020-09-12 DIAGNOSIS — Z66 Do not resuscitate: Secondary | ICD-10-CM | POA: Diagnosis present

## 2020-09-12 DIAGNOSIS — I5030 Unspecified diastolic (congestive) heart failure: Secondary | ICD-10-CM | POA: Diagnosis present

## 2020-09-12 DIAGNOSIS — I482 Chronic atrial fibrillation, unspecified: Secondary | ICD-10-CM | POA: Diagnosis present

## 2020-09-12 DIAGNOSIS — R29898 Other symptoms and signs involving the musculoskeletal system: Secondary | ICD-10-CM | POA: Diagnosis not present

## 2020-09-12 DIAGNOSIS — M170 Bilateral primary osteoarthritis of knee: Secondary | ICD-10-CM | POA: Diagnosis present

## 2020-09-12 DIAGNOSIS — M7989 Other specified soft tissue disorders: Secondary | ICD-10-CM | POA: Diagnosis not present

## 2020-09-12 DIAGNOSIS — Z882 Allergy status to sulfonamides status: Secondary | ICD-10-CM | POA: Diagnosis not present

## 2020-09-12 DIAGNOSIS — R5383 Other fatigue: Secondary | ICD-10-CM | POA: Diagnosis not present

## 2020-09-12 DIAGNOSIS — S42201D Unspecified fracture of upper end of right humerus, subsequent encounter for fracture with routine healing: Secondary | ICD-10-CM | POA: Diagnosis not present

## 2020-09-12 DIAGNOSIS — M79645 Pain in left finger(s): Secondary | ICD-10-CM | POA: Diagnosis not present

## 2020-09-12 DIAGNOSIS — R6889 Other general symptoms and signs: Secondary | ICD-10-CM | POA: Diagnosis not present

## 2020-09-12 DIAGNOSIS — Z993 Dependence on wheelchair: Secondary | ICD-10-CM | POA: Diagnosis not present

## 2020-09-12 DIAGNOSIS — Z8249 Family history of ischemic heart disease and other diseases of the circulatory system: Secondary | ICD-10-CM

## 2020-09-12 DIAGNOSIS — R5381 Other malaise: Secondary | ICD-10-CM | POA: Diagnosis not present

## 2020-09-12 DIAGNOSIS — S82832A Other fracture of upper and lower end of left fibula, initial encounter for closed fracture: Secondary | ICD-10-CM | POA: Diagnosis not present

## 2020-09-12 HISTORY — DX: Unspecified fracture of shaft of humerus, unspecified arm, initial encounter for closed fracture: S42.309A

## 2020-09-12 LAB — CBC WITH DIFFERENTIAL/PLATELET
Abs Immature Granulocytes: 0.06 10*3/uL (ref 0.00–0.07)
Basophils Absolute: 0 10*3/uL (ref 0.0–0.1)
Basophils Relative: 0 %
Eosinophils Absolute: 0.2 10*3/uL (ref 0.0–0.5)
Eosinophils Relative: 1 %
HCT: 39.5 % (ref 36.0–46.0)
Hemoglobin: 12.7 g/dL (ref 12.0–15.0)
Immature Granulocytes: 1 %
Lymphocytes Relative: 16 %
Lymphs Abs: 2.1 10*3/uL (ref 0.7–4.0)
MCH: 31.1 pg (ref 26.0–34.0)
MCHC: 32.2 g/dL (ref 30.0–36.0)
MCV: 96.6 fL (ref 80.0–100.0)
Monocytes Absolute: 1.1 10*3/uL — ABNORMAL HIGH (ref 0.1–1.0)
Monocytes Relative: 9 %
Neutro Abs: 9.8 10*3/uL — ABNORMAL HIGH (ref 1.7–7.7)
Neutrophils Relative %: 73 %
Platelets: 221 10*3/uL (ref 150–400)
RBC: 4.09 MIL/uL (ref 3.87–5.11)
RDW: 14.6 % (ref 11.5–15.5)
WBC: 13.2 10*3/uL — ABNORMAL HIGH (ref 4.0–10.5)
nRBC: 0 % (ref 0.0–0.2)

## 2020-09-12 LAB — BASIC METABOLIC PANEL
Anion gap: 9 (ref 5–15)
BUN: 35 mg/dL — ABNORMAL HIGH (ref 8–23)
CO2: 29 mmol/L (ref 22–32)
Calcium: 9.3 mg/dL (ref 8.9–10.3)
Chloride: 104 mmol/L (ref 98–111)
Creatinine, Ser: 1.09 mg/dL — ABNORMAL HIGH (ref 0.44–1.00)
GFR, Estimated: 47 mL/min — ABNORMAL LOW (ref >60)
Glucose, Bld: 108 mg/dL — ABNORMAL HIGH (ref 70–99)
Potassium: 4.6 mmol/L (ref 3.5–5.1)
Sodium: 142 mmol/L (ref 135–145)

## 2020-09-12 MED ORDER — HYDROCODONE-ACETAMINOPHEN 5-325 MG PO TABS
1.0000 | ORAL_TABLET | ORAL | Status: DC | PRN
  Administered 2020-09-12: 2 via ORAL
  Filled 2020-09-12: qty 2

## 2020-09-12 MED ORDER — ACETAMINOPHEN 325 MG PO TABS
650.0000 mg | ORAL_TABLET | Freq: Four times a day (QID) | ORAL | Status: DC | PRN
  Administered 2020-09-13 – 2020-09-18 (×8): 650 mg via ORAL
  Filled 2020-09-12 (×10): qty 2

## 2020-09-12 MED ORDER — FENTANYL CITRATE (PF) 100 MCG/2ML IJ SOLN
50.0000 ug | Freq: Once | INTRAMUSCULAR | Status: AC
  Administered 2020-09-12: 50 ug via INTRAVENOUS
  Filled 2020-09-12: qty 2

## 2020-09-12 MED ORDER — HYDROCODONE-ACETAMINOPHEN 5-325 MG PO TABS
2.0000 | ORAL_TABLET | Freq: Once | ORAL | Status: AC
  Administered 2020-09-12: 1 via ORAL
  Filled 2020-09-12: qty 2

## 2020-09-12 MED ORDER — ACETAMINOPHEN 650 MG RE SUPP: 650.0000 mg | Freq: Four times a day (QID) | RECTAL | Status: DC | PRN

## 2020-09-12 MED ORDER — ONDANSETRON HCL 4 MG PO TABS: 4.0000 mg | ORAL_TABLET | Freq: Four times a day (QID) | ORAL | Status: DC | PRN

## 2020-09-12 MED ORDER — ENOXAPARIN SODIUM 40 MG/0.4ML IJ SOSY
40.0000 mg | PREFILLED_SYRINGE | INTRAMUSCULAR | Status: DC
  Administered 2020-09-12 – 2020-09-17 (×6): 40 mg via SUBCUTANEOUS
  Filled 2020-09-12 (×6): qty 0.4

## 2020-09-12 MED ORDER — HYDROCODONE-ACETAMINOPHEN 5-325 MG PO TABS
1.0000 | ORAL_TABLET | Freq: Once | ORAL | Status: AC
  Administered 2020-09-12: 1 via ORAL
  Filled 2020-09-12: qty 1

## 2020-09-12 MED ORDER — HYDROCODONE-ACETAMINOPHEN 10-325 MG PO TABS
1.0000 | ORAL_TABLET | Freq: Three times a day (TID) | ORAL | Status: DC | PRN
  Administered 2020-09-13 – 2020-09-15 (×7): 1 via ORAL
  Filled 2020-09-12 (×7): qty 1

## 2020-09-12 MED ORDER — ONDANSETRON HCL 4 MG/2ML IJ SOLN: 4.0000 mg | Freq: Four times a day (QID) | INTRAMUSCULAR | Status: DC | PRN

## 2020-09-12 MED ORDER — POLYETHYLENE GLYCOL 3350 17 G PO PACK
17.0000 g | PACK | Freq: Two times a day (BID) | ORAL | Status: DC
  Administered 2020-09-12 – 2020-09-24 (×17): 17 g via ORAL
  Filled 2020-09-12 (×19): qty 1

## 2020-09-12 MED ORDER — TORSEMIDE 20 MG PO TABS
40.0000 mg | ORAL_TABLET | Freq: Every day | ORAL | Status: DC
  Administered 2020-09-12 – 2020-09-18 (×7): 40 mg via ORAL
  Filled 2020-09-12 (×7): qty 2

## 2020-09-12 NOTE — ED Triage Notes (Signed)
Pt BIB EMS. Pt fell when trying to transfer from [facility] to toilet with home health aide. Pt is complaining of right arm pain. No LOC, no blood thinners. Pain 10/10. No deformity noted.

## 2020-09-12 NOTE — ED Provider Notes (Signed)
[facility] DEPT Provider Note   CSN: [id number] Arrival date & time: 09/12/20  1119     History Chief Complaint  Patient presents with   [name] is a 87 y.o. female.  Pt is a 87y/o female who is wheelchair bound and lives at home with hx of afib on chronic anticoagulation, GI bleeding, HTN, chronic pain, diastolic heart failure presenting today after a fall at home.  Patient states that she was wheeled into the bathroom and her home health aide was helping her and they had move the wheelchair and her legs gave out and she fell to the ground hitting her right arm.  She did not lose consciousness or hit her head.  Since that time she has had significant pain in her right arm and cannot move it.  No pain in the wrist or elbow but seems more in the upper arm.  She denies any neck pain or pain in her legs or her left arm.  She was otherwise having a normal day this morning prior to this occurring.  Patient does take hydrocodone 10 mg on a daily basis but had not had any this morning.  She denies any chest pain, shortness of breath, abdominal pain, nausea or vomiting.  The history is provided by the patient and medical records.  Fall This is a new problem. The current episode started 1 to 2 hours ago. The problem occurs constantly. The problem has not changed since onset.Associated symptoms comments: Right arm pain. The symptoms are aggravated by bending and twisting. Nothing relieves the symptoms. She has tried nothing for the symptoms. The treatment provided no relief.      Past Medical History:  Diagnosis Date   Atrial fibrillation ([place])    CARCINOMA, BREAST    CHEST PAIN    DEGENERATIVE JOINT DISEASE    Diastolic heart failure ([place]) 08/03/2008   Qualifier: Diagnosis of  By: Burnett Kanaris     Edema    GIB (gastrointestinal bleeding) 05/24/2015   HYPERTENSION    Long term (current) use of anticoagulants    Malignant neoplasm of female  breast ([place]) 08/03/2008   Qualifier: Diagnosis of  By: Burnett Kanaris     Unspecified diastolic heart failure     Patient Active Problem List   Diagnosis Date Noted   Spinal stenosis of thoracolumbar region 11/02/2017   Elevated troponin 05/25/2015   GIB (gastrointestinal bleeding) 05/24/2015   GI bleed 05/24/2015   Right shoulder pain 05/24/2015   Primary osteoarthritis of left knee 05/24/2015   Bleeding gastrointestinal    Encounter for therapeutic drug monitoring 09/15/2013   Hip pain 05/02/2012   Fall 05/02/2012   Long term (current) use of anticoagulants 08/08/2010   DEGENERATIVE JOINT DISEASE 08/06/2008   Malignant neoplasm of female breast ([place]) 08/03/2008   Essential hypertension 08/03/2008   Atrial fibrillation ([place]) 40/98/1191   Diastolic heart failure ([place]) 08/03/2008   EDEMA 08/03/2008   CHEST PAIN 08/03/2008    Past Surgical History:  Procedure Laterality Date   BREAST LUMPECTOMY     ESOPHAGOGASTRODUODENOSCOPY (EGD) WITH PROPOFOL Left 05/28/2015   Procedure: ESOPHAGOGASTRODUODENOSCOPY (EGD) WITH PROPOFOL;  Surgeon: Wilford Corner, MD;  Location: [facility] ENDOSCOPY;  Service: Endoscopy;  Laterality: Left;   HIP SURGERY     KNEE SURGERY     LAMINECTOMY     TONSILLECTOMY       OB History   No obstetric history on file.     Family History  Problem Relation Age of Onset   CAD Mother    Heart attack Mother    Hypertension Mother    CAD Father    Stroke Brother    Heart attack Brother     Social History   Tobacco Use   Smoking status: Never   Smokeless tobacco: Never  Vaping Use   Vaping Use: Never used  Substance Use Topics   Alcohol use: No   Drug use: No    Home Medications Prior to Admission medications   Medication Sig Start Date End Date Taking? Authorizing Provider  calcium-vitamin D (OSCAL WITH D) 500-200 MG-UNIT per tablet Take 1 tablet by mouth daily.    [provider]  cholecalciferol (VITAMIN D) 1000 UNITS tablet Take 1,000  Units by mouth daily.    [provider]  HYDROcodone-acetaminophen (NORCO) 10-325 MG tablet Take 1 tablet by mouth every 8 (eight) hours as needed. 07/01/20   Silverio Decamp, MD  KLOR-CON M20 20 MEQ tablet TAKE 1 TABLET BY MOUTH 2 (TWO) TIMES DAILY. 01/26/20   Josue Hector, MD  Misc. Devices (POSTURE SEAT) MISC Lift chair 01/19/20   Silverio Decamp, MD  Multiple Vitamins-Minerals (CENTRUM PO) Take 1 tablet by mouth daily.    [provider]  Omega-3 Fatty Acids (FISH OIL PO) Take 1 tablet by mouth daily.    [provider]  polyethylene glycol (MIRALAX / GLYCOLAX) 17 g packet Take 17 g by mouth in the morning, at noon, and at bedtime. 06/30/19   Silverio Decamp, MD  torsemide (DEMADEX) 20 MG tablet TAKE 2 TABLETS (40 MG TOTAL) BY MOUTH DAILY. PT OVERDUE FOR YEARLY APPOINTMENT. PLEASE SCHEDULE FOR FURTHER REFILLS. ATTEMPT X 1 09/19/19   Burtis Junes, NP  vitamin C (ASCORBIC ACID) 500 MG tablet Take 500 mg by mouth daily.    [provider]    Allergies    Morphine and related, Sulfa antibiotics, Meperidine, Meperidine hcl, Nsaids, and Tolmetin  Review of Systems   Review of Systems  All other systems reviewed and are negative.  Physical Exam Updated Vital Signs BP (!) 142/60   Pulse (!) 59   Resp 18   Ht 5\' 7"  (1.702 m)   Wt 104.3 kg   SpO2 98%   BMI 36.01 kg/m   Physical Exam Vitals and nursing note reviewed.  Constitutional:      General: She is not in acute distress.    Appearance: Normal appearance. She is well-developed.     Comments: Appears uncomfortable  HENT:     Head: Normocephalic and atraumatic.     Mouth/Throat:     Mouth: Mucous membranes are moist.  Eyes:     Pupils: Pupils are equal, round, and reactive to light.  Cardiovascular:     Rate and Rhythm: Normal rate. Rhythm irregularly irregular.     Heart sounds: Normal heart sounds. No murmur heard.   No friction rub.  Pulmonary:     Effort:  Pulmonary effort is normal.     Breath sounds: Normal breath sounds. No wheezing or rales.  Abdominal:     General: Bowel sounds are normal. There is no distension.     Palpations: Abdomen is soft.     Tenderness: There is no abdominal tenderness. There is no guarding or rebound.  Musculoskeletal:        General: Tenderness, deformity and signs of injury present. Normal range of motion.     Right upper arm: Swelling, deformity and  tenderness present.     Right elbow: Normal.     Right wrist: Normal.       Arms:       Hands:     Comments: No right clavicle tenderness.    Skin:    General: Skin is warm and dry.     Capillary Refill: Capillary refill takes less than 2 seconds.     Findings: No rash.  Neurological:     Mental Status: She is alert and oriented to person, place, and time. Mental status is at baseline.     Cranial Nerves: No cranial nerve deficit.  Psychiatric:        Mood and Affect: Mood normal.        Behavior: Behavior normal.    ED Results / Procedures / Treatments   Labs (all labs ordered are listed, but only abnormal results are displayed) Labs Reviewed - No data to display  EKG None  Radiology DG Tibia/Fibula Left  Result Date: 09/12/2020 CLINICAL DATA:  Pain after fall. EXAM: LEFT TIBIA AND FIBULA - 2 VIEW COMPARISON:  May 25, 2015. FINDINGS: Diffuse demineralization of bone. Plate and screw fixation of a prior distal fibula fracture without evidence of loosening or perihardware fracture. Irregularity of the proximal fibula, similar to prior, sequela prior trauma. There is no evidence of acute fracture. Vascular calcifications. IMPRESSION: 1. No acute osseous abnormality visualized. 2. Prior ORIF of distal fibula fracture without evidence of hardware complication. 3. Diffuse demineralization of bone. Electronically Signed   By: Dahlia Bailiff MD   On: 09/12/2020 15:17   DG Shoulder Right Portable  Result Date: 09/12/2020 CLINICAL DATA:  Pain post EXAM:  PORTABLE RIGHT SHOULDER COMPARISON:  05/25/2015 FINDINGS: Osseous demineralization. AC joint alignment normal. Advanced RIGHT glenohumeral degenerative changes with joint space narrowing and spur formation. Displaced oblique versus spiral fracture of the proximal to mid RIGHT humeral diaphysis. No additional fracture, dislocation, or bone destruction. Visualized ribs intact. IMPRESSION: Mildly displaced spiral versus oblique fracture of the proximal to mid RIGHT humeral diaphysis. Osseous demineralization with advanced degenerative changes of RIGHT glenohumeral joint. Electronically Signed   By: Lavonia Dana M.D.   On: 09/12/2020 13:35   DG Knee Left Port  Result Date: 09/12/2020 CLINICAL DATA:  Fall with knee pain. EXAM: PORTABLE LEFT KNEE - 1-2 VIEW COMPARISON:  August 13, 2017 FINDINGS: No evidence of acute fracture or dislocation. Irregular appearance of the proximal fibula unchanged from prior, favored sequela prior trauma. Probable small joint effusion. Chondrocalcinosis with tricompartment degenerative change worse in the lateral and patellofemoral compartments. Vascular calcifications. IMPRESSION: 1. No acute fracture or dislocation visualized. 2. Chondrocalcinosis with stable tricompartment degenerative change most prominent in the lateral and patellofemoral compartments, suggestive of CPPD arthropathy. 3. Probable small joint effusion. Electronically Signed   By: Dahlia Bailiff MD   On: 09/12/2020 15:15   DG Humerus Right  Result Date: 09/12/2020 CLINICAL DATA:  [place] [address] trying to transfer from wheelchair to toilet, RIGHT arm pain EXAM: RIGHT HUMERUS - 2+ VIEW COMPARISON:  None FINDINGS: Osseous demineralization. Oblique versus spiral fracture RIGHT proximal to mid humeral diaphysis, with apex medial angulation and lateral displacement. Shoulder and elbow joint alignments normal with advanced RIGHT glenohumeral degenerative changes seen. IMPRESSION: Mildly displaced and angulated oblique versus spiral  fracture of proximal to mid RIGHT humeral diaphysis. Osseous demineralization. Advanced RIGHT glenohumeral degenerative changes. Electronically Signed   By: Lavonia Dana M.D.   On: 09/12/2020 13:45    Procedures Procedures   Medications Ordered  in ED Medications  HYDROcodone-acetaminophen (NORCO/VICODIN) 5-325 MG per tablet 2 tablet (has no administration in time range)    ED Course  I have reviewed the triage vital signs and the nursing notes.  Pertinent labs & imaging results that were available during my care of the patient were reviewed by me and considered in my medical decision making (see chart for details).    MDM Rules/Calculators/A&P                          Elderly female with a fall at home today without head injury.  She has significant pain in her right upper arm and concern for humerus fracture.  There is large hematoma and deformity noted.  She otherwise is neurovascularly intact.  Normal wrist range of motion and sensation in the hand.  Plain films are pending.  Patient does take 10 mg hydrocodone's at home every 8 hours as needed and has not had any today.  We will give that initially to see if that helps with pain control.  She is wheelchair-bound at all times and the fall today was a result of her transferring from the wheelchair to the toilet.  She denies any systemic symptoms.  3:50 PM Family member present and was also concerned about her left knee and tib/fib due to that was the leg that gave out.  Pt plain films of knee and tib/fib are neg for fracture.  Right humerus shows mildly displaced spiral versus oblique fracture of the proximal to mid right humeral diaphysis.  She was placed in a sling.  Patient appears more comfortable after her home dose of hydrocodone.  4:14 PM Spoke with Dr. Marcelino Scot who recommends co-aptation splint and f/u next wed.  When speaking with the pt and caregiver there are concerns about her mobility at home.  She uses a 4person assist at  baseline and now she can't have her arm moved.  They do not feel that she is safe to return home.  Her pain is tolerable at this point with her home pain medication.  She does report it still hurts but she is not moving at this time.  Feel that she will need admission to work with PT and caregivers to find a better way to manage her at home and prevent further falls.  MDM   Amount and/or Complexity of Data Reviewed Tests in the radiology section of CPT: ordered and reviewed Independent visualization of images, tracings, or specimens: yes     Final Clinical Impression(s) / ED Diagnoses Final diagnoses:  Fall, initial encounter  Closed displaced spiral fracture of shaft of right humerus, initial encounter    Rx / DC Orders ED Discharge Orders     None        Blanchie Dessert, MD 09/12/20 1619

## 2020-09-12 NOTE — ED Provider Notes (Signed)
  Physical Exam  BP (!) 147/69   Pulse (!) 54   Resp 18   Ht 5\' 7"  (1.702 m)   Wt 104.3 kg   SpO2 100%   BMI 36.01 kg/m   Physical Exam  ED Course/Procedures     Procedures  MDM  Received care of patient from Dr. Maryan Rued.  Please see her note for prior history, physical and care.  Briefly this is a 87 year old who fell and has a humerus fracture.  Dr. Ginette Pitman was consulted and recommended coaptation splint.  Dr. Maryan Rued reports concern for patient's safety and being discharged with acute pain and fracture.  Labs are pending with plan for admission.  Labs show no acute abnormalities.       Gareth Morgan, MD 09/13/20 0145

## 2020-09-12 NOTE — H&P (Signed)
History and Physical    [name]:[id number] DOB: 1922-06-29 DOA: 09/12/2020  PCP: [name] Nip, MD  Patient coming from: Home  Chief Complaint: Broke her arm  HPI: [name] is a 87 y.o. female with medical history significant of atrial fibrillation, diastolic heart failure, primary hypertension, osteoarthritis, history of breast cancer.  Patient lives at home and is taken care of by multiple caregivers.  This morning, patient had a fall in the bathroom when attempting to transfer from walker to commode, injuring her right arm. Patient is nonambulatory and moves via wheelchair; she transfers with the help of personal aids/friends in addition to a walker for support. After her fall this morning, and subsequent pain, EMS was called for transport to the ED. Patient did not take or do anything to help with her pain. Care giver states they do have a lift at home but have never used it.  ED Course: Vitals: Temperature 97.9 F, pulse of 51, respirations of 18, blood pressure four 142/69, SPO2 100% on room air Labs: BUN of 35, creatinine 1.09, WBC of 13.2k Imaging: Right shoulder x-ray significant for mildly displaced spiral/oblique fracture of the proximal/mid right humeral diaphysis Medications/Course: Norco, fentanyl IV  Review of Systems: Review of Systems  Constitutional:  Negative for chills and fever.  Respiratory:  Negative for cough and shortness of breath.   Cardiovascular:  Positive for leg swelling (chronic). Negative for chest pain and palpitations.  Gastrointestinal:  Positive for constipation. Negative for diarrhea, nausea and vomiting.  Neurological:  Positive for weakness. Negative for focal weakness and loss of consciousness.   Past Medical History:  Diagnosis Date   Atrial fibrillation ([place])    CARCINOMA, BREAST    CHEST PAIN    DEGENERATIVE JOINT DISEASE    Diastolic heart failure ([place]) 08/03/2008   Qualifier: Diagnosis of  By: Burnett Kanaris      Edema    GIB (gastrointestinal bleeding) 05/24/2015   HYPERTENSION    Long term (current) use of anticoagulants    Malignant neoplasm of female breast ([place]) 08/03/2008   Qualifier: Diagnosis of  By: Burnett Kanaris     Unspecified diastolic heart failure     Past Surgical History:  Procedure Laterality Date   BREAST LUMPECTOMY     ESOPHAGOGASTRODUODENOSCOPY (EGD) WITH PROPOFOL Left 05/28/2015   Procedure: ESOPHAGOGASTRODUODENOSCOPY (EGD) WITH PROPOFOL;  Surgeon: Wilford Corner, MD;  Location: [facility] ENDOSCOPY;  Service: Endoscopy;  Laterality: Left;   HIP SURGERY     KNEE SURGERY     LAMINECTOMY     TONSILLECTOMY       reports that she has never smoked. She has never used smokeless tobacco. She reports that she does not drink alcohol and does not use drugs.  Allergies  Allergen Reactions   Morphine And Related Other (See Comments)    Other Reaction: mild Intolerance, Allergy   Sulfa Antibiotics     Other reaction(s): Other (See Comments) Other Reaction: mild Intolerance, Allergy   Meperidine Nausea And Vomiting and Other (See Comments)   Meperidine Hcl Nausea Only   Nsaids     Other reaction(s): Other (See Comments) Bleeding ulcer   Tolmetin Other (See Comments)    Other reaction(s): Other (See Comments) Bleeding ulcer    Family History  Problem Relation Age of Onset   CAD Mother    Heart attack Mother    Hypertension Mother    CAD Father    Stroke Brother    Heart attack Brother  Prior to Admission medications   Medication Sig Start Date End Date Taking? Authorizing Provider  calcium-vitamin D (OSCAL WITH D) 500-200 MG-UNIT per tablet Take 1 tablet by mouth daily.    [provider]  cholecalciferol (VITAMIN D) 1000 UNITS tablet Take 1,000 Units by mouth daily.    [provider]  HYDROcodone-acetaminophen (NORCO) 10-325 MG tablet Take 1 tablet by mouth every 8 (eight) hours as needed. 07/01/20   Silverio Decamp, MD  KLOR-CON M20 20 MEQ  tablet TAKE 1 TABLET BY MOUTH 2 (TWO) TIMES DAILY. 01/26/20   Josue Hector, MD  Misc. Devices (POSTURE SEAT) MISC Lift chair 01/19/20   Silverio Decamp, MD  Multiple Vitamins-Minerals (CENTRUM PO) Take 1 tablet by mouth daily.    [provider]  Omega-3 Fatty Acids (FISH OIL PO) Take 1 tablet by mouth daily.    [provider]  polyethylene glycol (MIRALAX / GLYCOLAX) 17 g packet Take 17 g by mouth in the morning, at noon, and at bedtime. 06/30/19   Silverio Decamp, MD  torsemide (DEMADEX) 20 MG tablet TAKE 2 TABLETS (40 MG TOTAL) BY MOUTH DAILY. PT OVERDUE FOR YEARLY APPOINTMENT. PLEASE SCHEDULE FOR FURTHER REFILLS. ATTEMPT X 1 09/19/19   Burtis Junes, NP  vitamin C (ASCORBIC ACID) 500 MG tablet Take 500 mg by mouth daily.    [provider]    Physical Exam:  Physical Exam Constitutional:      General: She is not in acute distress.    Appearance: She is obese. She is not diaphoretic.  HENT:     Right Ear: Decreased hearing noted.     Left Ear: Decreased hearing noted.     Mouth/Throat:     Mouth: Mucous membranes are moist.  Eyes:     Conjunctiva/sclera: Conjunctivae normal.     Pupils: Pupils are equal, round, and reactive to light.  Cardiovascular:     Rate and Rhythm: Normal rate and regular rhythm.     Heart sounds: Normal heart sounds. No murmur heard. Pulmonary:     Effort: Pulmonary effort is normal. No respiratory distress.     Breath sounds: Normal breath sounds. No wheezing or rales.  Abdominal:     General: Bowel sounds are normal. There is no distension.     Palpations: Abdomen is soft.     Tenderness: There is no abdominal tenderness. There is no guarding or rebound.  Musculoskeletal:        General: No tenderness. Normal range of motion.     Cervical back: Normal range of motion.     Right lower leg: Edema present.     Left lower leg: Edema present.     Comments: Right arm in splint. Moves fingers spontaneously.   Lymphadenopathy:     Cervical: No cervical adenopathy.  Skin:    General: Skin is warm and dry.  Neurological:     Mental Status: She is alert and oriented to person, place, and time.    Labs on Admission: I have personally reviewed following labs and imaging studies  CBC: Recent Labs  Lab 09/12/20 1614  WBC 13.2*  NEUTROABS 9.8*  HGB 12.7  HCT 39.5  MCV 96.6  PLT 505    Basic Metabolic Panel: Recent Labs  Lab 09/12/20 1614  NA 142  K 4.6  CL 104  CO2 29  GLUCOSE 108*  BUN 35*  CREATININE 1.09*  CALCIUM 9.3    GFR: Estimated Creatinine Clearance: 38.4 mL/min (A) (  by C-G formula based on SCr of 1.09 mg/dL (H)).   Urine analysis:    Component Value Date/Time   COLORURINE YELLOW 11/11/2018 1009   APPEARANCEUR CLEAR 11/11/2018 1009   LABSPEC 1.008 11/11/2018 1009   PHURINE 6.0 11/11/2018 1009   GLUCOSEU NEGATIVE 11/11/2018 1009   HGBUR SMALL (A) 11/11/2018 1009   BILIRUBINUR NEGATIVE 11/11/2018 1009   KETONESUR NEGATIVE 11/11/2018 1009   PROTEINUR NEGATIVE 11/11/2018 1009   UROBILINOGEN 0.2 05/02/2012 0050   NITRITE NEGATIVE 11/11/2018 1009   LEUKOCYTESUR NEGATIVE 11/11/2018 1009     Radiological Exams on Admission: DG Tibia/Fibula Left  Result Date: 09/12/2020 CLINICAL DATA:  Pain after fall. EXAM: LEFT TIBIA AND FIBULA - 2 VIEW COMPARISON:  May 25, 2015. FINDINGS: Diffuse demineralization of bone. Plate and screw fixation of a prior distal fibula fracture without evidence of loosening or perihardware fracture. Irregularity of the proximal fibula, similar to prior, sequela prior trauma. There is no evidence of acute fracture. Vascular calcifications. IMPRESSION: 1. No acute osseous abnormality visualized. 2. Prior ORIF of distal fibula fracture without evidence of hardware complication. 3. Diffuse demineralization of bone. Electronically Signed   By: Dahlia Bailiff MD   On: 09/12/2020 15:17   DG Shoulder Right Portable  Result Date:  09/12/2020 CLINICAL DATA:  Pain post EXAM: PORTABLE RIGHT SHOULDER COMPARISON:  05/25/2015 FINDINGS: Osseous demineralization. AC joint alignment normal. Advanced RIGHT glenohumeral degenerative changes with joint space narrowing and spur formation. Displaced oblique versus spiral fracture of the proximal to mid RIGHT humeral diaphysis. No additional fracture, dislocation, or bone destruction. Visualized ribs intact. IMPRESSION: Mildly displaced spiral versus oblique fracture of the proximal to mid RIGHT humeral diaphysis. Osseous demineralization with advanced degenerative changes of RIGHT glenohumeral joint. Electronically Signed   By: Lavonia Dana M.D.   On: 09/12/2020 13:35   DG Knee Left Port  Result Date: 09/12/2020 CLINICAL DATA:  Fall with knee pain. EXAM: PORTABLE LEFT KNEE - 1-2 VIEW COMPARISON:  August 13, 2017 FINDINGS: No evidence of acute fracture or dislocation. Irregular appearance of the proximal fibula unchanged from prior, favored sequela prior trauma. Probable small joint effusion. Chondrocalcinosis with tricompartment degenerative change worse in the lateral and patellofemoral compartments. Vascular calcifications. IMPRESSION: 1. No acute fracture or dislocation visualized. 2. Chondrocalcinosis with stable tricompartment degenerative change most prominent in the lateral and patellofemoral compartments, suggestive of CPPD arthropathy. 3. Probable small joint effusion. Electronically Signed   By: Dahlia Bailiff MD   On: 09/12/2020 15:15   DG Humerus Right  Result Date: 09/12/2020 CLINICAL DATA:  [address] trying to transfer from wheelchair to toilet, RIGHT arm pain EXAM: RIGHT HUMERUS - 2+ VIEW COMPARISON:  None FINDINGS: Osseous demineralization. Oblique versus spiral fracture RIGHT proximal to mid humeral diaphysis, with apex medial angulation and lateral displacement. Shoulder and elbow joint alignments normal with advanced RIGHT glenohumeral degenerative changes seen. IMPRESSION: Mildly  displaced and angulated oblique versus spiral fracture of proximal to mid RIGHT humeral diaphysis. Osseous demineralization. Advanced RIGHT glenohumeral degenerative changes. Electronically Signed   By: Lavonia Dana M.D.   On: 09/12/2020 13:45     Assessment/Plan Principal Problem:   Humerus fracture Active Problems:   Essential hypertension   Atrial fibrillation (HCC)   Diastolic heart failure (HCC)   Primary osteoarthritis of left knee   Right humerus fracture Orthopedic surgery, Dr. Marcelino Scot, was consulted in the ED with recommendations for a co-aptation splint with follow-up for next week Wednesday.  Patient is taking care of by multiple caregivers and uses  her arm for assistance in transfers.  Caregivers with concern for transferring secondary to patient's fracture. -Norco as needed for pain management -Continue on splint -Discussed with orthopedic surgery, they will see patient in the morning -PT/OT ordered for recommendations  Atrial fibrillation This is chronic atrial fibrillation.  Patient was previously on Coumadin many years ago which was stopped secondary to GI bleeds.  Patient also with AV nodal disease and currently not on a beta-blocker or other rate control medication.  Currently rate controlled.  Chronic diastolic heart failure No evidence of acute symptoms.  Patient is on torsemide as an outpatient. -Continue torsemide  Primary hypertension Not currently on antihypertensives per last cardiology note. Slightly elevated. -Routine vitals  Primary osteoarthritis of left knee History of right knee replacement.  Left knee is inoperable.  Patient follows with sports medicine.  Likely reason for recent fall.  Patient is on chronic Norco as an outpatient.   DVT prophylaxis: Lovenox Code Status: Full code Family Communication: Friend (caregiver) at bedside Disposition Plan: Discharge home versus SNF pending PT/OT recommendations.  If patient can go home, likely discharge in  1 day. Consults called: Orthopedic surgery, Dr. Marcelino Scot Admission status: Observation   Cordelia Poche, MD [facility] 09/12/2020, 6:28 PM

## 2020-09-12 NOTE — ED Notes (Signed)
Ortho tech called to apply other splint.

## 2020-09-13 DIAGNOSIS — S42294A Other nondisplaced fracture of upper end of right humerus, initial encounter for closed fracture: Secondary | ICD-10-CM | POA: Diagnosis not present

## 2020-09-13 DIAGNOSIS — I5032 Chronic diastolic (congestive) heart failure: Secondary | ICD-10-CM | POA: Diagnosis not present

## 2020-09-13 DIAGNOSIS — I10 Essential (primary) hypertension: Secondary | ICD-10-CM | POA: Diagnosis not present

## 2020-09-13 DIAGNOSIS — I482 Chronic atrial fibrillation, unspecified: Secondary | ICD-10-CM | POA: Diagnosis not present

## 2020-09-13 LAB — BASIC METABOLIC PANEL
Anion gap: 10 (ref 5–15)
BUN: 32 mg/dL — ABNORMAL HIGH (ref 8–23)
CO2: 29 mmol/L (ref 22–32)
Calcium: 8.9 mg/dL (ref 8.9–10.3)
Chloride: 101 mmol/L (ref 98–111)
Creatinine, Ser: 0.88 mg/dL (ref 0.44–1.00)
GFR, Estimated: >60 mL/min (ref >60)
Glucose, Bld: 95 mg/dL (ref 70–99)
Potassium: 3.3 mmol/L — ABNORMAL LOW (ref 3.5–5.1)
Sodium: 140 mmol/L (ref 135–145)

## 2020-09-13 LAB — CBC
HCT: 35.9 % — ABNORMAL LOW (ref 36.0–46.0)
Hemoglobin: 11.7 g/dL — ABNORMAL LOW (ref 12.0–15.0)
MCH: 31 pg (ref 26.0–34.0)
MCHC: 32.6 g/dL (ref 30.0–36.0)
MCV: 95.2 fL (ref 80.0–100.0)
Platelets: 206 10*3/uL (ref 150–400)
RBC: 3.77 MIL/uL — ABNORMAL LOW (ref 3.87–5.11)
RDW: 14.5 % (ref 11.5–15.5)
WBC: 8.3 10*3/uL (ref 4.0–10.5)
nRBC: 0 % (ref 0.0–0.2)

## 2020-09-13 LAB — SARS CORONAVIRUS 2 (TAT 6-24 HRS): SARS Coronavirus 2: NEGATIVE

## 2020-09-13 NOTE — Evaluation (Signed)
Occupational Therapy Evaluation Patient Details Name: [name] MRN: [id number] DOB: [birth year] Today's Date: 09/13/2020    History of Present Illness [name] is a 87 y.o. female with medical history significant of atrial fibrillation, diastolic heart failure, primary hypertension, osteoarthritis, history of breast cancer.  Patient fell transferring to toilet and injuring right arm. Patient found to have right humeral fracture. Being treated conservatively. NWB with sling.   Clinical Impression   [name] is a 87 year old woman who lives at home with  24/7 caregiver support. Typically she is able to assist with standing and transferring to wc and toilet using upper extremities and assist with UB ADLs. Patient s/p fall at home with right humeral fracture and inability to right arm. Patient total assist for bed transfer and total assist for attempt to sit to stand which she could not do. She is near total assist at bed level and in significant pain. Patient will benefit from skilled OT services while in hospital to improve deficits and learn compensatory strategies as needed in order to improve functional abilities. Recommend short term rehab at discharge.     Follow Up Recommendations  SNF    Equipment Recommendations  None recommended by OT    Recommendations for Other Services       Precautions / Restrictions Precautions Precautions: Fall Precaution Comments: NWB RUE, left knee/shin pain Restrictions Weight Bearing Restrictions: Yes RUE Weight Bearing: Non weight bearing      Mobility Bed Mobility Overal bed mobility: Needs Assistance Bed Mobility: Supine to Sit;Sit to Supine     Supine to sit: +2 for physical assistance;+2 for safety/equipment;Total assist;HOB elevated Sit to supine: Total assist;+2 for physical assistance;+2 for safety/equipment   General bed mobility comments: Patient significantly limited by pain. Doesn't typically sleep in a  bed. total assist to pivot to edge and scoot forward. +2 total assist to partially lean forward and lift butt off bed. Unable to come into standing. Patient not assisting with power up. Returned to supine with total assist.    Transfers                      Balance Overall balance assessment: Needs assistance Sitting-balance support: No upper extremity supported Sitting balance-Leahy Scale: Fair                                     ADL either performed or assessed with clinical judgement   ADL Overall ADL's : Needs assistance/impaired Eating/Feeding: Set up;Bed level   Grooming: Set up;Bed level   Upper Body Bathing: Total assistance;Bed level   Lower Body Bathing: Bed level;+2 for physical assistance;Total assistance;+2 for safety/equipment   Upper Body Dressing : Total assistance;Bed level   Lower Body Dressing: +2 for physical assistance;+2 for safety/equipment;Total assistance     Toilet Transfer Details (indicate cue type and reason): unable Toileting- Clothing Manipulation and Hygiene: Total assistance;+2 for physical assistance;+2 for safety/equipment               Vision Patient Visual Report: No change from baseline       Perception     Praxis      Pertinent Vitals/Pain Pain Assessment: Faces Pain Score: 10-Worst pain ever Pain Location: R arm, L knee Pain Descriptors / Indicators: Grimacing;Aching;Sharp;Guarding;Throbbing Pain Intervention(s): Limited activity within patient's tolerance;Premedicated before session;RN gave pain meds during session     Hand  Dominance Right   Extremity/Trunk Assessment Upper Extremity Assessment Upper Extremity Assessment: RUE deficits/detail;LUE deficits/detail RUE Deficits / Details: able to wiggle fingers, splint to upper arm RUE: Unable to fully assess due to immobilization RUE Sensation: WNL RUE Coordination: WNL LUE Deficits / Details: 2+/5 shoulder ROM, otherwise functional ROM LUE  Sensation: WNL LUE Coordination: WNL   Lower Extremity Assessment Lower Extremity Assessment: Defer to PT evaluation   Cervical / Trunk Assessment Cervical / Trunk Assessment: Kyphotic   Communication Communication Communication: No difficulties   Cognition Arousal/Alertness: Awake/alert Behavior During Therapy: WFL for tasks assessed/performed Overall Cognitive Status: Within Functional Limits for tasks assessed                                     General Comments       Exercises     Shoulder Instructions      Home Living Family/patient expects to be discharged to:: Private residence Living Arrangements: Other relatives;Non-relatives/Friends Available Help at Discharge: Family;Friend(s) Type of Home: House       Home Layout: Two level;Able to live on main level with bedroom/bathroom           Bathroom Accessibility: Yes   Home Equipment: Walker - 2 wheels;Wheelchair - [employer];Hospital bed (power lift chair (sleeps in it))   Additional Comments: lives on basement, 1 level can do everything. Aides 7-8hr/ day assist with ADLs (feed self, brush own teeth)      Prior Functioning/Environment Level of Independence: Needs assistance  Gait / Transfers Assistance Needed: Only stands and transfers with use of RW and one caregiver. Sleep in lift chair. uses lift chair to stand. Decreased ROM in left knee - chronic pain in knee/shin at all times. With standing patient has been able to assist with sit to stand with upper extremities and hold onto walker while caregiver assists with steadying - they usually move the wheelchair behind her. ADL's / Homemaking Assistance Needed: Able to feed self and perform groomiong. has assistance with all ADLs.            OT Problem List: Decreased strength;Decreased range of motion;Decreased activity tolerance;Impaired balance (sitting and/or standing);Pain;Impaired UE functional use;Decreased knowledge of use  of DME or AE;Decreased knowledge of precautions      OT Treatment/Interventions: Self-care/ADL training;Therapeutic exercise;DME and/or AE instruction;Therapeutic activities;Balance training;Patient/family education    OT Goals(Current goals can be found in the care plan section) Acute Rehab OT Goals Patient Stated Goal: stand and transfer to toilet OT Goal Formulation: With patient Time For Goal Achievement: 09/27/20 Potential to Achieve Goals: Fair  OT Frequency: Min 2X/week   Barriers to D/C:            Co-evaluation PT/OT/SLP Co-Evaluation/Treatment: Yes Reason for Co-Treatment: For patient/therapist safety;To address functional/ADL transfers (co-eval)          AM-PAC OT "6 Clicks" Daily Activity     Outcome Measure Help from another person eating meals?: A Little Help from another person taking care of personal grooming?: A Little Help from another person toileting, which includes using toliet, bedpan, or urinal?: Total Help from another person bathing (including washing, rinsing, drying)?: Total Help from another person to put on and taking off regular upper body clothing?: Total Help from another person to put on and taking off regular lower body clothing?: Total 6 Click Score: 10   End of Session Nurse Communication: Mobility status;Need for lift equipment  Activity Tolerance: Patient limited by pain Patient left: in bed;with call bell/phone within reach;with bed alarm set;with nursing/sitter in room;with family/visitor present  OT Visit Diagnosis: Muscle weakness (generalized) (M62.81);Pain Pain - Right/Left: Right Pain - part of body: Shoulder                Time: 4199-1444 OT Time Calculation (min): 32 min Charges:  OT General Charges $OT Visit: 1 Visit OT Evaluation $OT Eval Moderate Complexity: 1 Mod  Alik Mawson, OTR/L [place]  Office [phone] Pager: [phone]   Lenward Chancellor 09/13/2020, 1:57 PM

## 2020-09-13 NOTE — Evaluation (Signed)
Physical Therapy Evaluation Patient Details Name: [name] MRN: [id number] DOB: Jun 20, 1922 Today's Date: 09/13/2020   History of Present Illness  [name] is a 87 y.o. female with medical history significant of atrial fibrillation, diastolic heart failure, primary hypertension, osteoarthritis, history of breast cancer.  Patient fell transferring to toilet and injuring right arm. Patient found to have right humeral fracture. Being treated conservatively. NWB with sling.  Clinical Impression  Pt is a 87 y.o. female with above listed HPI who lives at home with 24/7 caregiver support. Typically, pt is able to stand with RW to assist with transfers to wc and toilet with +1 assist. Pt is currently NWB on R UE and experiencing increased pain levels significantly limiting her mobility. Pt required total A +2 for all mobility this session due to generalized weakness and significant pain in R UE and L knee. Pt was unable to achieve full stand with +2 assist today. Recommend SNF as pt requires significantly increased assistance for mobility compared to baseline. Pt will benefit from skilled PT to educate on safe mobility techniques in order to maximize function to allow safe discharge to the venue listed below.       Follow Up Recommendations SNF    Equipment Recommendations       Recommendations for Other Services       Precautions / Restrictions Precautions Precautions: Fall Precaution Comments: NWB RUE, left knee/shin pain Restrictions Weight Bearing Restrictions: Yes RUE Weight Bearing: Non weight bearing      Mobility  Bed Mobility Overal bed mobility: Needs Assistance Bed Mobility: Supine to Sit;Sit to Supine     Supine to sit: Total assist;+2 for physical assistance;+2 for safety/equipment;HOB elevated Sit to supine: Total assist;+2 for physical assistance;+2 for safety/equipment   General bed mobility comments: Pt total A for all bed mobility and significantly  limited by pain. Pt usually sleeps in lift chair at home. +2 assist to pivot to EOB and for scooting while seated.    Transfers Overall transfer level: Needs assistance Equipment used: 2 person hand held assist Transfers: Sit to/from Stand Sit to Stand: Total assist;+2 physical assistance;+2 safety/equipment;From elevated surface         General transfer comment: x2; Total A+2 for forward weight shift and to lift buttock off of bed with knee block provided. Pt with no assist for power up to lift from EOB and unable to achieve full stand.  Ambulation/Gait                Stairs            Wheelchair Mobility    Modified Rankin (Stroke Patients Only)       Balance Overall balance assessment: Needs assistance Sitting-balance support: Feet supported Sitting balance-Leahy Scale: Fair                                       Pertinent Vitals/Pain Pain Assessment: 0-10 Pain Score: 8  Pain Location: R arm, L knee Pain Descriptors / Indicators: Grimacing;Aching;Sharp;Guarding;Throbbing Pain Intervention(s): Limited activity within patient's tolerance;Monitored during session;Repositioned;Premedicated before session    Home Living Family/patient expects to be discharged to:: Private residence Living Arrangements: Other relatives;Non-relatives/Friends Available Help at Discharge: Family;Friend(s) Type of Home: House       Home Layout: Two level;Able to live on main level with bedroom/bathroom Home Equipment: [name] - 2 wheels;Wheelchair - [employer];Hospital bed (power lift chair (  sleeps in it)) Additional Comments: lives at brother's house in basement, where she has access to bathroom. Home aides 7-8hr/ day assist with ADLs (feed self, brush own teeth)    Prior Function Level of Independence: Needs assistance   Gait / Transfers Assistance Needed: Only stands and transfers with use of RW and one caregiver. Sleeps in lift chair. uses lift  chair to stand. Decreased ROM in left knee - chronic pain in knee/shin at all times. With standing patient has been able to assist with sit to stand with upper extremities and hold onto walker while caregiver assists with steadying - they usually move the wheelchair behind her.  ADL's / Homemaking Assistance Needed: Able to feed self and perform groomiong. has assistance with all ADLs.        Hand Dominance   Dominant Hand: Right    Extremity/Trunk Assessment   Upper Extremity Assessment Upper Extremity Assessment: Defer to OT evaluation RUE Deficits / Details: able to wiggle fingers, splint to upper arm RUE: Unable to fully assess due to immobilization RUE Sensation: WNL RUE Coordination: WNL LUE Deficits / Details: 2+/5 shoulder ROM, otherwise functional ROM LUE Sensation: WNL LUE Coordination: WNL    Lower Extremity Assessment Lower Extremity Assessment: Generalized weakness;LLE deficits/detail LLE Deficits / Details: pt unable to achieve knee ext on L due to pain, resting with towel roll placed under upon enty. LLE: Unable to fully assess due to pain LLE Sensation: WNL    Cervical / Trunk Assessment Cervical / Trunk Assessment: Kyphotic  Communication   Communication: No difficulties  Cognition Arousal/Alertness: Awake/alert Behavior During Therapy: WFL for tasks assessed/performed Overall Cognitive Status: Within Functional Limits for tasks assessed                                        General Comments      Exercises     Assessment/Plan    PT Assessment Patient needs continued PT services  PT Problem List Decreased strength;Decreased range of motion;Decreased activity tolerance;Decreased balance;Decreased mobility;Pain       PT Treatment Interventions Functional mobility training;Therapeutic activities;Therapeutic exercise;Patient/family education    PT Goals (Current goals can be found in the Care Plan section)  Acute Rehab PT  Goals Patient Stated Goal: stand and transfer to toilet PT Goal Formulation: With patient (and her friend/caregiver) Time For Goal Achievement: 09/27/20 Potential to Achieve Goals: Fair    Frequency Min 2X/week   Barriers to discharge        Co-evaluation PT/OT/SLP Co-Evaluation/Treatment: Yes Reason for Co-Treatment: To address functional/ADL transfers;For patient/therapist safety PT goals addressed during session: Mobility/safety with mobility         AM-PAC PT "6 Clicks" Mobility  Outcome Measure Help needed turning from your back to your side while in a flat bed without using bedrails?: Total Help needed moving from lying on your back to sitting on the side of a flat bed without using bedrails?: Total Help needed moving to and from a bed to a chair (including a wheelchair)?: Total Help needed standing up from a chair using your arms (e.g., wheelchair or bedside chair)?: Total Help needed to walk in hospital room?: Total Help needed climbing 3-5 steps with a railing? : Total 6 Click Score: 6    End of Session Equipment Utilized During Treatment: Gait belt Activity Tolerance: Patient limited by pain Patient left: in bed;with call bell/phone within reach;with family/visitor present  Nurse Communication: Mobility status (hoyer lift for OOB mobility) PT Visit Diagnosis: Muscle weakness (generalized) (M62.81);Unsteadiness on feet (R26.81);Other abnormalities of gait and mobility (R26.89);History of falling (Z91.81);Pain Pain - Right/Left: Right Pain - part of body: Arm (and L knee)    Time: 6122-4497 PT Time Calculation (min) (ACUTE ONLY): 33 min   Charges:   PT Evaluation $PT Eval Moderate Complexity: 1 Mod          Festus Barren PT, DPT  Acute Rehabilitation Services  Office [phone]    09/13/2020, 4:10 PM

## 2020-09-13 NOTE — NC FL2 (Signed)
[place] LEVEL OF CARE SCREENING TOOL     IDENTIFICATION  Patient Name: [name] Birthdate: [birth year] Sex: female Admission Date (Current Location): 09/12/2020  [facility] and [place] Number:  Herbalist and Address:  [facility],  [place], [place]      Provider Number: [id number]  Attending Physician Name and Address:  Aline August, MD  Relative Name and Phone Number:  Patrycja, [facility])   [phone]    Current Level of Care: Hospital Recommended Level of Care: [place] Prior Approval Number:    Date Approved/Denied:   PASRR Number: [id number]  Discharge Plan: SNF    Current Diagnoses: Patient Active Problem List   Diagnosis Date Noted   Humerus fracture 09/12/2020   Spinal stenosis of thoracolumbar region 11/02/2017   Elevated troponin 05/25/2015   GIB (gastrointestinal bleeding) 05/24/2015   GI bleed 05/24/2015   Right shoulder pain 05/24/2015   Primary osteoarthritis of left knee 05/24/2015   Bleeding gastrointestinal    Encounter for therapeutic drug monitoring 09/15/2013   Hip pain 05/02/2012   Fall 05/02/2012   Long term (current) use of anticoagulants 08/08/2010   DEGENERATIVE JOINT DISEASE 08/06/2008   Malignant neoplasm of female breast ([place]) 08/03/2008   Essential hypertension 08/03/2008   Atrial fibrillation ([place]) 33/54/5625   Diastolic heart failure ([place]) 08/03/2008   EDEMA 08/03/2008   CHEST PAIN 08/03/2008    Orientation RESPIRATION BLADDER Height & Weight     Self, Situation, Place  Normal External catheter Weight: 104.3 kg Height:  5\' 7"  (170.2 cm)  BEHAVIORAL SYMPTOMS/MOOD NEUROLOGICAL BOWEL NUTRITION STATUS   (none)  (none) Continent Diet (see d/c summary)  AMBULATORY STATUS COMMUNICATION OF NEEDS Skin   Extensive Assist Verbally Normal                       Personal Care Assistance Level of Assistance  Bathing, Feeding, Dressing  Bathing Assistance: Maximum assistance Feeding assistance: Limited assistance Dressing Assistance: Maximum assistance     Functional Limitations Info  Sight, Hearing, Speech Sight Info: Adequate Hearing Info: Adequate Speech Info: Adequate    SPECIAL CARE FACTORS FREQUENCY  PT (By licensed PT), OT (By licensed OT)     PT Frequency: 5X/W OT Frequency: 5X/W            Contractures Contractures Info: Not present    Additional Factors Info  Code Status, Allergies Code Status Info: DNR Allergies Info: Morphine And Related   Sulfa Antibiotics   Meperidine   Meperidine Hcl   Nsaids   Tolmetin           Current Medications (09/13/2020):  This is the current hospital active medication list Current Facility-Administered Medications  Medication Dose Route Frequency Provider Last Rate Last Admin   acetaminophen (TYLENOL) tablet 650 mg  650 mg Oral Q6H PRN Mariel Aloe, MD   650 mg at 09/13/20 1147   Or   acetaminophen (TYLENOL) suppository 650 mg  650 mg Rectal Q6H PRN Mariel Aloe, MD       enoxaparin (LOVENOX) injection 40 mg  40 mg Subcutaneous Q24H Mariel Aloe, MD   40 mg at 09/12/20 2234   HYDROcodone-acetaminophen (NORCO) 10-325 MG per tablet 1 tablet  1 tablet Oral Q8H PRN Mariel Aloe, MD   1 tablet at 09/13/20 1047   ondansetron (ZOFRAN) tablet 4 mg  4 mg Oral Q6H PRN Mariel Aloe, MD  Or   ondansetron (ZOFRAN) injection 4 mg  4 mg Intravenous Q6H PRN Mariel Aloe, MD       polyethylene glycol (MIRALAX / GLYCOLAX) packet 17 g  17 g Oral BID Mariel Aloe, MD   17 g at 09/13/20 1047   torsemide (DEMADEX) tablet 40 mg  40 mg Oral Daily Mariel Aloe, MD   40 mg at 09/13/20 1047     Discharge Medications: Please see discharge summary for a list of discharge medications.  Relevant Imaging Results:  Relevant Lab Results:   Additional Information ss#[id number].   Trish Mage, LCSW

## 2020-09-13 NOTE — Progress Notes (Signed)
Patient ID: [name], female   DOB: [birth year], 87 y.o.   MRN: [id number]  PROGRESS NOTE    [name]  XBL:[id number] DOB: 09-29-1922 DOA: 09/12/2020 PCP: Aretta Nip, MD   Brief Narrative:  87 y.o. female with medical history significant of chronic atrial fibrillation, chronic diastolic heart failure, primary hypertension, osteoarthritis, history of breast cancer presented with mechanical fall and right shoulder pain.  On presentation, she was found to have mildly displaced spiral/oblique fracture of the proximal/mid right humeral diaphysis.  Orthopedics recommended conservative management.  Assessment & Plan:   Right humerus fracture status post mechanical fall -found to have mildly displaced spiral/oblique fracture of the proximal/mid right humeral diaphysis.  Orthopedics recommended conservative management a -PT eval   Chronic atrial fibrillation -Currently rate controlled.  Not on any AV nodal blocking agents because of AV nodal disease.  Off Coumadin many years ago because of GI bleed   Chronic diastolic heart failure -Compensated.  Continue torsemide.  Outpatient follow-up with cardiology   Hypertension -Stable.  Continue torsemide   Left knee osteoarthritis -Apparently left knee is inoperable.  Has history of right knee replacement.  Outpatient follow-up with sports medicine.     DVT prophylaxis: Lovenox Code Status:  DNR, confirmed by the patient Family Communication: None at bedside Disposition Plan: Status is: Observation  The patient will require care spanning > 2 midnights and should be moved to inpatient because: Inpatient level of care appropriate due to severity of illness  Dispo: The patient is from: Home              Anticipated d/c is to: SNF. Might need SNF pending PT eval              Patient currently is not medically stable to d/c.   Difficult to place patient No   Consultants: Orthopedics  Procedures: None  Antimicrobials:  None   Subjective: Patient seen and examined at bedside.  Complains of right shoulder pain and left knee pain.  No worsening shortness of breath, nausea or vomiting or fever.  Objective: Vitals:   09/12/20 2034 09/13/20 0023 09/13/20 0430 09/13/20 0840  BP: (!) 159/69 (!) 144/64 (!) 119/53 (!) 136/59  Pulse: (!) 58 (!) 58 (!) 53 (!) 48  Resp: 20 18 20 16   Temp: 98.1 F (36.7 C) 97.6 F (36.4 C) 97.9 F (36.6 C) 98.3 F (36.8 C)  TempSrc: Oral Oral Oral Oral  SpO2: 98% 97% 94% 93%  Weight:      Height:        Intake/Output Summary (Last 24 hours) at 09/13/2020 1348 Last data filed at 09/13/2020 0900 Gross per 24 hour  Intake 120 ml  Output 1300 ml  Net -1180 ml   Filed Weights   09/12/20 1127  Weight: 104.3 kg    Examination:  General: Pt is alert, awake, not in acute distress.  Elderly female lying in the bed slightly hard of hearing.  Answers questions appropriately. Cardiovascular: rate controlled, S1/S2 + Respiratory: bilateral decreased breath sounds at bases Abdominal: Soft, NT, ND, bowel sounds + Extremities: Right shoulder/arm is in splint; no cyanosis    Data Reviewed: I have personally reviewed following labs and imaging studies  CBC: Recent Labs  Lab 09/12/20 1614 09/13/20 0435  WBC 13.2* 8.3  NEUTROABS 9.8*  --   HGB 12.7 11.7*  HCT 39.5 35.9*  MCV 96.6 95.2  PLT 221 300   Basic Metabolic Panel: Recent Labs  Lab 09/12/20 1614 09/13/20  0435  NA 142 140  K 4.6 3.3*  CL 104 101  CO2 29 29  GLUCOSE 108* 95  BUN 35* 32*  CREATININE 1.09* 0.88  CALCIUM 9.3 8.9   GFR: Estimated Creatinine Clearance: 47.5 mL/min (by C-G formula based on SCr of 0.88 mg/dL). Liver Function Tests: No results for input(s): AST, ALT, ALKPHOS, BILITOT, PROT, ALBUMIN in the last 168 hours. No results for input(s): LIPASE, AMYLASE in the last 168 hours. No results for input(s): AMMONIA in the last 168 hours. Coagulation Profile: No results for input(s): INR,  PROTIME in the last 168 hours. Cardiac Enzymes: No results for input(s): CKTOTAL, CKMB, CKMBINDEX, TROPONINI in the last 168 hours. BNP (last 3 results) No results for input(s): PROBNP in the last 8760 hours. HbA1C: No results for input(s): HGBA1C in the last 72 hours. CBG: No results for input(s): GLUCAP in the last 168 hours. Lipid Profile: No results for input(s): CHOL, HDL, LDLCALC, TRIG, CHOLHDL, LDLDIRECT in the last 72 hours. Thyroid Function Tests: No results for input(s): TSH, T4TOTAL, FREET4, T3FREE, THYROIDAB in the last 72 hours. Anemia Panel: No results for input(s): VITAMINB12, FOLATE, FERRITIN, TIBC, IRON, RETICCTPCT in the last 72 hours. Sepsis Labs: No results for input(s): PROCALCITON, LATICACIDVEN in the last 168 hours.  Recent Results (from the past 240 hour(s))  SARS CORONAVIRUS 2 (TAT 6-24 HRS) Nasopharyngeal Nasopharyngeal Swab     Status: None   Collection Time: 09/12/20  4:14 PM   Specimen: Nasopharyngeal Swab  Result Value Ref Range Status   SARS Coronavirus 2 NEGATIVE NEGATIVE Final    Comment: (NOTE) SARS-CoV-2 target nucleic acids are NOT DETECTED.  The SARS-CoV-2 RNA is generally detectable in upper and lower respiratory specimens during the acute phase of infection. Negative results do not preclude SARS-CoV-2 infection, do not rule out co-infections with other pathogens, and should not be used as the sole basis for treatment or other patient management decisions. Negative results must be combined with clinical observations, patient history, and epidemiological information. The expected result is Negative.  Fact Sheet for Patients: SugarRoll.be  Fact Sheet for Healthcare Providers: https://www.woods-mathews.com/  This test is not yet approved or cleared by the [place] FDA and  has been authorized for detection and/or diagnosis of SARS-CoV-2 by FDA under an Emergency Use Authorization (EUA). This  EUA will remain  in effect (meaning this test can be used) for the duration of the COVID-19 declaration under Se ction 564(b)(1) of the Act, 21 U.S.C. section 360bbb-3(b)(1), unless the authorization is terminated or revoked sooner.  Performed at [facility], [place] [address], [place], [place] [address]          Radiology Studies: DG Tibia/Fibula Left  Result Date: 09/12/2020 CLINICAL DATA:  Pain after fall. EXAM: LEFT TIBIA AND FIBULA - 2 VIEW COMPARISON:  May 25, 2015. FINDINGS: Diffuse demineralization of bone. Plate and screw fixation of a prior distal fibula fracture without evidence of loosening or perihardware fracture. Irregularity of the proximal fibula, similar to prior, sequela prior trauma. There is no evidence of acute fracture. Vascular calcifications. IMPRESSION: 1. No acute osseous abnormality visualized. 2. Prior ORIF of distal fibula fracture without evidence of hardware complication. 3. Diffuse demineralization of bone. Electronically Signed   By: Dahlia Bailiff MD   On: 09/12/2020 15:17   DG Shoulder Right Portable  Result Date: 09/12/2020 CLINICAL DATA:  Pain post EXAM: PORTABLE RIGHT SHOULDER COMPARISON:  05/25/2015 FINDINGS: Osseous demineralization. AC joint alignment normal. Advanced RIGHT glenohumeral degenerative changes  with joint space narrowing and spur formation. Displaced oblique versus spiral fracture of the proximal to mid RIGHT humeral diaphysis. No additional fracture, dislocation, or bone destruction. Visualized ribs intact. IMPRESSION: Mildly displaced spiral versus oblique fracture of the proximal to mid RIGHT humeral diaphysis. Osseous demineralization with advanced degenerative changes of RIGHT glenohumeral joint. Electronically Signed   By: Lavonia Dana M.D.   On: 09/12/2020 13:35   DG Knee Left Port  Result Date: 09/12/2020 CLINICAL DATA:  Fall with knee pain. EXAM: PORTABLE LEFT KNEE - 1-2 VIEW COMPARISON:  August 13, 2017 FINDINGS: No  evidence of acute fracture or dislocation. Irregular appearance of the proximal fibula unchanged from prior, favored sequela prior trauma. Probable small joint effusion. Chondrocalcinosis with tricompartment degenerative change worse in the lateral and patellofemoral compartments. Vascular calcifications. IMPRESSION: 1. No acute fracture or dislocation visualized. 2. Chondrocalcinosis with stable tricompartment degenerative change most prominent in the lateral and patellofemoral compartments, suggestive of CPPD arthropathy. 3. Probable small joint effusion. Electronically Signed   By: Dahlia Bailiff MD   On: 09/12/2020 15:15   DG Humerus Right  Result Date: 09/12/2020 CLINICAL DATA:  [address] trying to transfer from wheelchair to toilet, RIGHT arm pain EXAM: RIGHT HUMERUS - 2+ VIEW COMPARISON:  None FINDINGS: Osseous demineralization. Oblique versus spiral fracture RIGHT proximal to mid humeral diaphysis, with apex medial angulation and lateral displacement. Shoulder and elbow joint alignments normal with advanced RIGHT glenohumeral degenerative changes seen. IMPRESSION: Mildly displaced and angulated oblique versus spiral fracture of proximal to mid RIGHT humeral diaphysis. Osseous demineralization. Advanced RIGHT glenohumeral degenerative changes. Electronically Signed   By: Lavonia Dana M.D.   On: 09/12/2020 13:45   DG Finger Index Left  Result Date: 09/12/2020 CLINICAL DATA:  Recent fall with second digit pain, initial encounter EXAM: LEFT INDEX FINGER 2+V COMPARISON:  None. FINDINGS: Degenerative changes of the interphalangeal joints are seen. Generalized soft tissue swelling is noted. No definitive fracture or dislocation is seen. IMPRESSION: Soft tissue swelling without acute bony abnormality. Electronically Signed   By: Inez Catalina M.D.   On: 09/12/2020 19:53        Scheduled Meds:  enoxaparin (LOVENOX) injection  40 mg Subcutaneous Q24H   polyethylene glycol  17 g Oral BID   torsemide  40 mg  Oral Daily   Continuous Infusions:        Aline August, MD [facility] 09/13/2020, 1:48 PM

## 2020-09-13 NOTE — Consult Note (Signed)
Reason for Consult:Right humerus fx Referring Physician: Aline August Time called: [id number] Time at bedside: [name] is an 87 y.o. female.  HPI: [name] was at home transferring to the toilet when her left leg buckled and she fell to the floor. She had immediate right arm pain. She was brought to the ED where x-rays showed a right humerus fx and orthopedic surgery was consulted. She c/o localized pain to the arm. She lives at home with help and is RHD. She does not ambulate but does transfer.  Past Medical History:  Diagnosis Date   Atrial fibrillation ([place])    CARCINOMA, BREAST    CHEST PAIN    DEGENERATIVE JOINT DISEASE    Diastolic heart failure ([place]) 08/03/2008   Qualifier: Diagnosis of  By: Burnett Kanaris     Edema    GIB (gastrointestinal bleeding) 05/24/2015   HYPERTENSION    Long term (current) use of anticoagulants    Malignant neoplasm of female breast ([place]) 08/03/2008   Qualifier: Diagnosis of  By: Burnett Kanaris     Unspecified diastolic heart failure     Past Surgical History:  Procedure Laterality Date   BREAST LUMPECTOMY     ESOPHAGOGASTRODUODENOSCOPY (EGD) WITH PROPOFOL Left 05/28/2015   Procedure: ESOPHAGOGASTRODUODENOSCOPY (EGD) WITH PROPOFOL;  Surgeon: Wilford Corner, MD;  Location: [facility] ENDOSCOPY;  Service: Endoscopy;  Laterality: Left;   HIP SURGERY     KNEE SURGERY     LAMINECTOMY     TONSILLECTOMY      Family History  Problem Relation Age of Onset   CAD Mother    Heart attack Mother    Hypertension Mother    CAD Father    Stroke Brother    Heart attack Brother     Social History:  reports that she has never smoked. She has never used smokeless tobacco. She reports that she does not drink alcohol and does not use drugs.  Allergies:  Allergies  Allergen Reactions   Morphine And Related Other (See Comments)    Other Reaction: mild Intolerance, Allergy   Sulfa Antibiotics     Other reaction(s): Other (See Comments) Other Reaction:  mild Intolerance, Allergy   Meperidine Nausea And Vomiting and Other (See Comments)   Meperidine Hcl Nausea Only   Nsaids     Other reaction(s): Other (See Comments) Bleeding ulcer   Tolmetin Other (See Comments)    Other reaction(s): Other (See Comments) Bleeding ulcer    Medications: I have reviewed the patient's current medications.  Results for orders placed or performed during the hospital encounter of 09/12/20 (from the past 48 hour(s))  CBC with Differential/Platelet     Status: Abnormal   Collection Time: 09/12/20  4:14 PM  Result Value Ref Range   WBC 13.2 (H) 4.0 - 10.5 K/uL   RBC 4.09 3.87 - 5.11 MIL/uL   Hemoglobin 12.7 12.0 - 15.0 g/dL   HCT 39.5 36.0 - 46.0 %   MCV 96.6 80.0 - 100.0 fL   MCH 31.1 26.0 - 34.0 pg   MCHC 32.2 30.0 - 36.0 g/dL   RDW 14.6 11.5 - 15.5 %   Platelets 221 150 - 400 K/uL   nRBC 0.0 0.0 - 0.2 %   Neutrophils Relative % 73 %   Neutro Abs 9.8 (H) 1.7 - 7.7 K/uL   Lymphocytes Relative 16 %   Lymphs Abs 2.1 0.7 - 4.0 K/uL   Monocytes Relative 9 %   Monocytes Absolute 1.1 (H) 0.1 -  1.0 K/uL   Eosinophils Relative 1 %   Eosinophils Absolute 0.2 0.0 - 0.5 K/uL   Basophils Relative 0 %   Basophils Absolute 0.0 0.0 - 0.1 K/uL   Immature Granulocytes 1 %   Abs Immature Granulocytes 0.06 0.00 - 0.07 K/uL    Comment: Performed at [facility], [place] [address], [place], [place] [address]  Basic metabolic panel     Status: Abnormal   Collection Time: 09/12/20  4:14 PM  Result Value Ref Range   Sodium 142 135 - 145 mmol/L   Potassium 4.6 3.5 - 5.1 mmol/L   Chloride 104 98 - 111 mmol/L   CO2 29 22 - 32 mmol/L   Glucose, Bld 108 (H) 70 - 99 mg/dL    Comment: Glucose reference range applies only to samples taken after fasting for at least 8 hours.   BUN 35 (H) 8 - 23 mg/dL   Creatinine, Ser 1.09 (H) 0.44 - 1.00 mg/dL   Calcium 9.3 8.9 - 10.3 mg/dL   GFR, Estimated 47 (L) >60 mL/min    Comment: (NOTE) Calculated using the  CKD-EPI Creatinine Equation (2021)    Anion gap 9 5 - 15    Comment: Performed at [facility], [place] [address] [place], [place] [address]  SARS CORONAVIRUS 2 (TAT 6-24 HRS) Nasopharyngeal Nasopharyngeal Swab     Status: None   Collection Time: 09/12/20  4:14 PM   Specimen: Nasopharyngeal Swab  Result Value Ref Range   SARS Coronavirus 2 NEGATIVE NEGATIVE    Comment: (NOTE) SARS-CoV-2 target nucleic acids are NOT DETECTED.  The SARS-CoV-2 RNA is generally detectable in upper and lower respiratory specimens during the acute phase of infection. Negative results do not preclude SARS-CoV-2 infection, do not rule out co-infections with other pathogens, and should not be used as the sole basis for treatment or other patient management decisions. Negative results must be combined with clinical observations, patient history, and epidemiological information. The expected result is Negative.  Fact Sheet for Patients: SugarRoll.be  Fact Sheet for Healthcare Providers: https://www.woods-mathews.com/  This test is not yet approved or cleared by the [place] FDA and  has been authorized for detection and/or diagnosis of SARS-CoV-2 by FDA under an Emergency Use Authorization (EUA). This EUA will remain  in effect (meaning this test can be used) for the duration of the COVID-19 declaration under Se ction 564(b)(1) of the Act, 21 U.S.C. section 360bbb-3(b)(1), unless the authorization is terminated or revoked sooner.  Performed at [facility], [place] [address], [place], [place] [address]   Basic metabolic panel     Status: Abnormal   Collection Time: 09/13/20  4:35 AM  Result Value Ref Range   Sodium 140 135 - 145 mmol/L   Potassium 3.3 (L) 3.5 - 5.1 mmol/L   Chloride 101 98 - 111 mmol/L   CO2 29 22 - 32 mmol/L   Glucose, Bld 95 70 - 99 mg/dL    Comment: Glucose reference range applies only to samples taken after  fasting for at least 8 hours.   BUN 32 (H) 8 - 23 mg/dL   Creatinine, Ser 0.88 0.44 - 1.00 mg/dL   Calcium 8.9 8.9 - 10.3 mg/dL   GFR, Estimated >60 >60 mL/min    Comment: (NOTE) Calculated using the CKD-EPI Creatinine Equation (2021)    Anion gap 10 5 - 15    Comment: Performed at [facility], [place] [address], [place], [place] [address]  CBC     Status: Abnormal   Collection Time: 09/13/20  4:35 AM  Result Value Ref Range   WBC 8.3 4.0 - 10.5 K/uL   RBC 3.77 (L) 3.87 - 5.11 MIL/uL   Hemoglobin 11.7 (L) 12.0 - 15.0 g/dL   HCT 35.9 (L) 36.0 - 46.0 %   MCV 95.2 80.0 - 100.0 fL   MCH 31.0 26.0 - 34.0 pg   MCHC 32.6 30.0 - 36.0 g/dL   RDW 14.5 11.5 - 15.5 %   Platelets 206 150 - 400 K/uL   nRBC 0.0 0.0 - 0.2 %    Comment: Performed at [facility], [place] [address], [place], [place] [address]    DG Tibia/Fibula Left  Result Date: 09/12/2020 CLINICAL DATA:  Pain after fall. EXAM: LEFT TIBIA AND FIBULA - 2 VIEW COMPARISON:  May 25, 2015. FINDINGS: Diffuse demineralization of bone. Plate and screw fixation of a prior distal fibula fracture without evidence of loosening or perihardware fracture. Irregularity of the proximal fibula, similar to prior, sequela prior trauma. There is no evidence of acute fracture. Vascular calcifications. IMPRESSION: 1. No acute osseous abnormality visualized. 2. Prior ORIF of distal fibula fracture without evidence of hardware complication. 3. Diffuse demineralization of bone. Electronically Signed   By: Dahlia Bailiff MD   On: 09/12/2020 15:17   DG Shoulder Right Portable  Result Date: 09/12/2020 CLINICAL DATA:  Pain post EXAM: PORTABLE RIGHT SHOULDER COMPARISON:  05/25/2015 FINDINGS: Osseous demineralization. AC joint alignment normal. Advanced RIGHT glenohumeral degenerative changes with joint space narrowing and spur formation. Displaced oblique versus spiral fracture of the proximal to mid RIGHT humeral diaphysis. No  additional fracture, dislocation, or bone destruction. Visualized ribs intact. IMPRESSION: Mildly displaced spiral versus oblique fracture of the proximal to mid RIGHT humeral diaphysis. Osseous demineralization with advanced degenerative changes of RIGHT glenohumeral joint. Electronically Signed   By: Lavonia Dana M.D.   On: 09/12/2020 13:35   DG Knee Left Port  Result Date: 09/12/2020 CLINICAL DATA:  Fall with knee pain. EXAM: PORTABLE LEFT KNEE - 1-2 VIEW COMPARISON:  August 13, 2017 FINDINGS: No evidence of acute fracture or dislocation. Irregular appearance of the proximal fibula unchanged from prior, favored sequela prior trauma. Probable small joint effusion. Chondrocalcinosis with tricompartment degenerative change worse in the lateral and patellofemoral compartments. Vascular calcifications. IMPRESSION: 1. No acute fracture or dislocation visualized. 2. Chondrocalcinosis with stable tricompartment degenerative change most prominent in the lateral and patellofemoral compartments, suggestive of CPPD arthropathy. 3. Probable small joint effusion. Electronically Signed   By: Dahlia Bailiff MD   On: 09/12/2020 15:15   DG Humerus Right  Result Date: 09/12/2020 CLINICAL DATA:  [place] [address] trying to transfer from wheelchair to toilet, RIGHT arm pain EXAM: RIGHT HUMERUS - 2+ VIEW COMPARISON:  None FINDINGS: Osseous demineralization. Oblique versus spiral fracture RIGHT proximal to mid humeral diaphysis, with apex medial angulation and lateral displacement. Shoulder and elbow joint alignments normal with advanced RIGHT glenohumeral degenerative changes seen. IMPRESSION: Mildly displaced and angulated oblique versus spiral fracture of proximal to mid RIGHT humeral diaphysis. Osseous demineralization. Advanced RIGHT glenohumeral degenerative changes. Electronically Signed   By: Lavonia Dana M.D.   On: 09/12/2020 13:45   DG Finger Index Left  Result Date: 09/12/2020 CLINICAL DATA:  Recent fall with second digit pain,  initial encounter EXAM: LEFT INDEX FINGER 2+V COMPARISON:  None. FINDINGS: Degenerative changes of the interphalangeal joints are seen. Generalized soft tissue swelling is noted. No definitive fracture or dislocation is  seen. IMPRESSION: Soft tissue swelling without acute bony abnormality. Electronically Signed   By: Inez Catalina M.D.   On: 09/12/2020 19:53    Review of Systems  HENT:  Negative for ear discharge, ear pain, hearing loss and tinnitus.   Eyes:  Negative for photophobia and pain.  Respiratory:  Negative for cough and shortness of breath.   Cardiovascular:  Negative for chest pain.  Gastrointestinal:  Negative for abdominal pain, nausea and vomiting.  Genitourinary:  Negative for dysuria, flank pain, frequency and urgency.  Musculoskeletal:  Positive for arthralgias (Right arm). Negative for back pain, myalgias and neck pain.  Neurological:  Negative for dizziness and headaches.  Hematological:  Does not bruise/bleed easily.  Psychiatric/Behavioral:  The patient is not nervous/anxious.   Blood pressure (!) 136/59, pulse (!) 48, temperature 98.3 F (36.8 C), temperature source Oral, resp. rate 16, height 5\' 7"  (1.702 m), weight 104.3 kg, SpO2 93 %. Physical Exam Constitutional:      General: She is not in acute distress.    Appearance: She is well-developed. She is not diaphoretic.  HENT:     Head: Normocephalic and atraumatic.  Eyes:     General: No scleral icterus.       Right eye: No discharge.        Left eye: No discharge.     Conjunctiva/sclera: Conjunctivae normal.  Cardiovascular:     Rate and Rhythm: Normal rate and regular rhythm.  Pulmonary:     Effort: Pulmonary effort is normal. No respiratory distress.  Musculoskeletal:     Cervical back: Normal range of motion.     Comments: Right shoulder, elbow, wrist, digits- no skin wounds, coap splint in place, no instability, no blocks to motion  Sens  Ax/R/M/U intact  Mot   Ax/ R/ PIN/ M/ AIN/ U intact  Rad 2+   Skin:    General: Skin is warm and dry.  Neurological:     Mental Status: She is alert.  Psychiatric:        Mood and Affect: Mood normal.        Behavior: Behavior normal.    Assessment/Plan: Right humerus fx -- Plan non-operative management with NWB RUE. F/u with Dr. Marcelino Scot in 2-3 weeks. Anticipate discharge to SNF.    Lisette Abu, PA-C Orthopedic Surgery [phone] 09/13/2020, 11:18 AM

## 2020-09-13 NOTE — TOC Initial Note (Signed)
Transition of Care [facility]) - Initial/Assessment Note    Patient Details  Name: [name] MRN: [id number] Date of Birth: [birth year]  [facility] CM/SW Contact:    [name] Mage, LCSW Phone Number: 09/13/2020, 3:27 PM  Clinical Narrative:   Patient seen in follow up to OT recommendation of SNF.  [name] lives here in [place] with her brother, has all needed DME including lift chair and wheel chair, has an aide in home during the day and friend [name], who was in the room, stays with her at night.  She was a one person assist prior to admission.  Goal is for her to return home when she can help with transfers again.  She has been in short term rehab before, understands the process. Bed search initiated.  Did not initiate insurance auth as did not have PT note yet. TOC will continue to follow during the course of hospitalization.                 Expected Discharge Plan: Skilled Nursing Facility Barriers to Discharge: SNF Pending bed offer   Patient Goals and CMS Choice     Choice offered to / list presented to : Patient  Expected Discharge Plan and Services Expected Discharge Plan: [place]   Discharge Planning Services: CM Consult Post Acute Care Choice: [place] Living arrangements for the past 2 months: Single Family Home                                      Prior Living Arrangements/Services Living arrangements for the past 2 months: Single Family Home Lives with:: Siblings Patient language and need for interpreter reviewed:: Yes        Need for Family Participation in Patient Care: Yes (Comment) Care giver support system in place?: Yes (comment) Current home services: DME, Homehealth aide Criminal Activity/Legal Involvement Pertinent to Current Situation/Hospitalization: No - Comment as needed  Activities of Daily Living Home Assistive Devices/Equipment: Hospital bed, Raised toilet seat with rails, Eyeglasses  (lift chair - uses lift chair to sleep in and in the daytime, reading glasses) ADL Screening (condition at time of admission) Patient's cognitive ability adequate to safely complete daily activities?: Yes Is the patient deaf or have difficulty hearing?: Yes Does the patient have difficulty seeing, even when wearing glasses/contacts?: No Does the patient have difficulty concentrating, remembering, or making decisions?: No Patient able to express need for assistance with ADLs?: Yes Does the patient have difficulty dressing or bathing?: Yes Independently performs ADLs?: No Communication: Independent Dressing (OT): Needs assistance Is this a change from baseline?: Pre-admission baseline Grooming: Needs assistance Is this a change from baseline?: Change from baseline, expected to last >3 days Feeding: Needs assistance Is this a change from baseline?: Change from baseline, expected to last >3 days Bathing: Needs assistance Is this a change from baseline?: Pre-admission baseline Toileting: Needs assistance Is this a change from baseline?: Pre-admission baseline In/Out Bed: Needs assistance Is this a change from baseline?: Pre-admission baseline Walks in Home: Dependent Is this a change from baseline?: Pre-admission baseline Does the patient have difficulty walking or climbing stairs?: Yes Weakness of Legs: Both Weakness of Arms/Hands: Both (Right arm is broken - new today)  Permission Sought/Granted Permission sought to share information with : Family Supports Permission granted to share information with : Yes, Verbal Permission Granted  Share Information with NAME: [name]  Friend    [phone]           Emotional Assessment Appearance:: Appears stated age Attitude/Demeanor/Rapport: Engaged Affect (typically observed): Appropriate Orientation: : Oriented to Self, Oriented to Place, Oriented to Situation Alcohol / Substance Use: Not Applicable Psych Involvement: No  (comment)  Admission diagnosis:  Humerus fracture [S42.309A] Fall [W19.XXXA] Fall, initial encounter [id number]] Closed displaced spiral fracture of shaft of right humerus, initial encounter [S42.341A] Patient Active Problem List   Diagnosis Date Noted   Humerus fracture 09/12/2020   Spinal stenosis of thoracolumbar region 11/02/2017   Elevated troponin 05/25/2015   GIB (gastrointestinal bleeding) 05/24/2015   GI bleed 05/24/2015   Right shoulder pain 05/24/2015   Primary osteoarthritis of left knee 05/24/2015   Bleeding gastrointestinal    Encounter for therapeutic drug monitoring 09/15/2013   Hip pain 05/02/2012   Fall 05/02/2012   Long term (current) use of anticoagulants 08/08/2010   DEGENERATIVE JOINT DISEASE 08/06/2008   Malignant neoplasm of female breast ([place]) 08/03/2008   Essential hypertension 08/03/2008   Atrial fibrillation ([place]) 09/81/1914   Diastolic heart failure ([place]) 08/03/2008   EDEMA 08/03/2008   CHEST PAIN 08/03/2008   PCP:  [name] Nip, MD Pharmacy:   [facility] [place], [place] - [address] [place] [address] Phone: [phone] Fax: [phone]     Social Determinants of Health (SDOH) Interventions    Readmission Risk Interventions No flowsheet data found.

## 2020-09-14 DIAGNOSIS — I482 Chronic atrial fibrillation, unspecified: Secondary | ICD-10-CM | POA: Diagnosis not present

## 2020-09-14 DIAGNOSIS — I10 Essential (primary) hypertension: Secondary | ICD-10-CM | POA: Diagnosis not present

## 2020-09-14 DIAGNOSIS — I5032 Chronic diastolic (congestive) heart failure: Secondary | ICD-10-CM | POA: Diagnosis not present

## 2020-09-14 DIAGNOSIS — S42294A Other nondisplaced fracture of upper end of right humerus, initial encounter for closed fracture: Secondary | ICD-10-CM | POA: Diagnosis not present

## 2020-09-14 NOTE — Progress Notes (Signed)
Patient ID: [name], female   DOB: 05-04-1922, 87 y.o.   MRN: [id number]  PROGRESS NOTE    TIJA BISS  LGX:[id number] DOB: 1922/03/13 DOA: 09/12/2020 PCP: Aretta Nip, MD   Brief Narrative:  87 y.o. female with medical history significant of chronic atrial fibrillation, chronic diastolic heart failure, primary hypertension, osteoarthritis, history of breast cancer presented with mechanical fall and right shoulder pain.  On presentation, she was found to have mildly displaced spiral/oblique fracture of the proximal/mid right humeral diaphysis.  Orthopedics recommended conservative management.  Assessment & Plan:   Right humerus fracture status post mechanical fall -found to have mildly displaced spiral/oblique fracture of the proximal/mid right humeral diaphysis.  Orthopedics recommended conservative management and outpatient orthopedic follow-up -PT recommended SNF placement.  Social worker following.  Chronic atrial fibrillation -Currently rate controlled.  Not on any AV nodal blocking agents because of AV nodal disease.  Off Coumadin many years ago because of GI bleed   Chronic diastolic heart failure -Compensated.  Continue torsemide.  Outpatient follow-up with cardiology   Hypertension -Stable.  Continue torsemide   Left knee osteoarthritis -Apparently left knee is inoperable.  Has history of right knee replacement.  Outpatient follow-up with sports medicine.  Leukocytosis -Resolved  Hypokalemia -No labs available today.     DVT prophylaxis: Lovenox Code Status:  DNR, confirmed by the patient Family Communication: None at bedside Disposition Plan: Status is: Observation  The patient will require care spanning > 2 midnights and should be moved to inpatient because: Inpatient level of care appropriate due to severity of illness  Dispo: The patient is from: Home              Anticipated d/c is to: SNF.               Patient currently is medically  stable to d/c.   Difficult to place patient No   Consultants: Orthopedics  Procedures: None  Antimicrobials: None   Subjective: Patient seen and examined at bedside.  No fever, vomiting, worsening shortness of breath reported.  Complains of right shoulder pain. Objective: Vitals:   09/13/20 1404 09/13/20 2041 09/14/20 0506 09/14/20 0510  BP: (!) 106/49 (!) 117/45 135/60 (!) 151/74  Pulse: (!) 56 (!) 55 (!) 52 72  Resp: 20 (!) 24 20 20   Temp: 97.8 F (36.6 C) 99.2 F (37.3 C) 98.7 F (37.1 C) 98.6 F (37 C)  TempSrc: Oral Oral Oral Oral  SpO2: 93% 93% 94% 98%  Weight:      Height:        Intake/Output Summary (Last 24 hours) at 09/14/2020 0804 Last data filed at 09/14/2020 0624 Gross per 24 hour  Intake 360 ml  Output 750 ml  Net -390 ml    Filed Weights   09/12/20 1127  Weight: 104.3 kg    Examination:  General: Pt is alert, awake.  Elderly female lying in the bed slightly hard of hearing.  Answers questions appropriately.  Currently on room air.  Acute distress Cardiovascular: S1-S2 heard, intermittently bradycardic  respiratory: Decreased breath sounds at bases bilaterally Abdominal: Soft, NT, mildly distended; normal bowel sounds heard Extremities: No clubbing or edema; right shoulder/arm is in splint   Data Reviewed: I have personally reviewed following labs and imaging studies  CBC: Recent Labs  Lab 09/12/20 1614 09/13/20 0435  WBC 13.2* 8.3  NEUTROABS 9.8*  --   HGB 12.7 11.7*  HCT 39.5 35.9*  MCV 96.6 95.2  PLT 221 206  Basic Metabolic Panel: Recent Labs  Lab 09/12/20 1614 09/13/20 0435  NA 142 140  K 4.6 3.3*  CL 104 101  CO2 29 29  GLUCOSE 108* 95  BUN 35* 32*  CREATININE 1.09* 0.88  CALCIUM 9.3 8.9    GFR: Estimated Creatinine Clearance: 47.5 mL/min (by C-G formula based on SCr of 0.88 mg/dL). Liver Function Tests: No results for input(s): AST, ALT, ALKPHOS, BILITOT, PROT, ALBUMIN in the last 168 hours. No results for  input(s): LIPASE, AMYLASE in the last 168 hours. No results for input(s): AMMONIA in the last 168 hours. Coagulation Profile: No results for input(s): INR, PROTIME in the last 168 hours. Cardiac Enzymes: No results for input(s): CKTOTAL, CKMB, CKMBINDEX, TROPONINI in the last 168 hours. BNP (last 3 results) No results for input(s): PROBNP in the last 8760 hours. HbA1C: No results for input(s): HGBA1C in the last 72 hours. CBG: No results for input(s): GLUCAP in the last 168 hours. Lipid Profile: No results for input(s): CHOL, HDL, LDLCALC, TRIG, CHOLHDL, LDLDIRECT in the last 72 hours. Thyroid Function Tests: No results for input(s): TSH, T4TOTAL, FREET4, T3FREE, THYROIDAB in the last 72 hours. Anemia Panel: No results for input(s): VITAMINB12, FOLATE, FERRITIN, TIBC, IRON, RETICCTPCT in the last 72 hours. Sepsis Labs: No results for input(s): PROCALCITON, LATICACIDVEN in the last 168 hours.  Recent Results (from the past 240 hour(s))  SARS CORONAVIRUS 2 (TAT 6-24 HRS) Nasopharyngeal Nasopharyngeal Swab     Status: None   Collection Time: 09/12/20  4:14 PM   Specimen: Nasopharyngeal Swab  Result Value Ref Range Status   SARS Coronavirus 2 NEGATIVE NEGATIVE Final    Comment: (NOTE) SARS-CoV-2 target nucleic acids are NOT DETECTED.  The SARS-CoV-2 RNA is generally detectable in upper and lower respiratory specimens during the acute phase of infection. Negative results do not preclude SARS-CoV-2 infection, do not rule out co-infections with other pathogens, and should not be used as the sole basis for treatment or other patient management decisions. Negative results must be combined with clinical observations, patient history, and epidemiological information. The expected result is Negative.  Fact Sheet for Patients: SugarRoll.be  Fact Sheet for Healthcare Providers: https://www.woods-mathews.com/  This test is not yet approved or  cleared by the [place] FDA and  has been authorized for detection and/or diagnosis of SARS-CoV-2 by FDA under an Emergency Use Authorization (EUA). This EUA will remain  in effect (meaning this test can be used) for the duration of the COVID-19 declaration under Se ction 564(b)(1) of the Act, 21 U.S.C. section 360bbb-3(b)(1), unless the authorization is terminated or revoked sooner.  Performed at [facility], [place] [address] [place], [place] [address]           Radiology Studies: DG Tibia/Fibula Left  Result Date: 09/12/2020 CLINICAL DATA:  Pain after fall. EXAM: LEFT TIBIA AND FIBULA - 2 VIEW COMPARISON:  May 25, 2015. FINDINGS: Diffuse demineralization of bone. Plate and screw fixation of a prior distal fibula fracture without evidence of loosening or perihardware fracture. Irregularity of the proximal fibula, similar to prior, sequela prior trauma. There is no evidence of acute fracture. Vascular calcifications. IMPRESSION: 1. No acute osseous abnormality visualized. 2. Prior ORIF of distal fibula fracture without evidence of hardware complication. 3. Diffuse demineralization of bone. Electronically Signed   By: Dahlia Bailiff MD   On: 09/12/2020 15:17   DG Shoulder Right Portable  Result Date: 09/12/2020 CLINICAL DATA:  Pain post EXAM: PORTABLE RIGHT SHOULDER COMPARISON:  05/25/2015  FINDINGS: Osseous demineralization. AC joint alignment normal. Advanced RIGHT glenohumeral degenerative changes with joint space narrowing and spur formation. Displaced oblique versus spiral fracture of the proximal to mid RIGHT humeral diaphysis. No additional fracture, dislocation, or bone destruction. Visualized ribs intact. IMPRESSION: Mildly displaced spiral versus oblique fracture of the proximal to mid RIGHT humeral diaphysis. Osseous demineralization with advanced degenerative changes of RIGHT glenohumeral joint. Electronically Signed   By: Lavonia Dana M.D.   On: 09/12/2020 13:35   DG  Knee Left Port  Result Date: 09/12/2020 CLINICAL DATA:  Fall with knee pain. EXAM: PORTABLE LEFT KNEE - 1-2 VIEW COMPARISON:  August 13, 2017 FINDINGS: No evidence of acute fracture or dislocation. Irregular appearance of the proximal fibula unchanged from prior, favored sequela prior trauma. Probable small joint effusion. Chondrocalcinosis with tricompartment degenerative change worse in the lateral and patellofemoral compartments. Vascular calcifications. IMPRESSION: 1. No acute fracture or dislocation visualized. 2. Chondrocalcinosis with stable tricompartment degenerative change most prominent in the lateral and patellofemoral compartments, suggestive of CPPD arthropathy. 3. Probable small joint effusion. Electronically Signed   By: Dahlia Bailiff MD   On: 09/12/2020 15:15   DG Humerus Right  Result Date: 09/12/2020 CLINICAL DATA:  [place] [address] trying to transfer from wheelchair to toilet, RIGHT arm pain EXAM: RIGHT HUMERUS - 2+ VIEW COMPARISON:  None FINDINGS: Osseous demineralization. Oblique versus spiral fracture RIGHT proximal to mid humeral diaphysis, with apex medial angulation and lateral displacement. Shoulder and elbow joint alignments normal with advanced RIGHT glenohumeral degenerative changes seen. IMPRESSION: Mildly displaced and angulated oblique versus spiral fracture of proximal to mid RIGHT humeral diaphysis. Osseous demineralization. Advanced RIGHT glenohumeral degenerative changes. Electronically Signed   By: Lavonia Dana M.D.   On: 09/12/2020 13:45   DG Finger Index Left  Result Date: 09/12/2020 CLINICAL DATA:  Recent fall with second digit pain, initial encounter EXAM: LEFT INDEX FINGER 2+V COMPARISON:  None. FINDINGS: Degenerative changes of the interphalangeal joints are seen. Generalized soft tissue swelling is noted. No definitive fracture or dislocation is seen. IMPRESSION: Soft tissue swelling without acute bony abnormality. Electronically Signed   By: Inez Catalina M.D.   On:  09/12/2020 19:53        Scheduled Meds:  enoxaparin (LOVENOX) injection  40 mg Subcutaneous Q24H   polyethylene glycol  17 g Oral BID   torsemide  40 mg Oral Daily   Continuous Infusions:        Aline August, MD [facility] 09/14/2020, 8:04 AM

## 2020-09-14 NOTE — TOC Progression Note (Signed)
Transition of Care [facility]) - Progression Note    Patient Details  Name: [name] MRN: [id number] Date of Birth: 19-Dec-1922  Transition of Care [facility]) CM/SW [place], LCSW Phone Number:[phone] 09/14/2020, 2:39 PM  Clinical Narrative:    CSW requested insurance Auth for SNF References# [id number].   Expected Discharge Plan: Skilled Nursing Facility Barriers to Discharge: SNF Pending bed offer  Expected Discharge Plan and Services Expected Discharge Plan: [place]   Discharge Planning Services: CM Consult Post Acute Care Choice: [place] Living arrangements for the past 2 months: Single Family Home                                       Social Determinants of Health (SDOH) Interventions    Readmission Risk Interventions No flowsheet data found.

## 2020-09-15 DIAGNOSIS — I482 Chronic atrial fibrillation, unspecified: Secondary | ICD-10-CM | POA: Diagnosis not present

## 2020-09-15 DIAGNOSIS — S42294A Other nondisplaced fracture of upper end of right humerus, initial encounter for closed fracture: Secondary | ICD-10-CM | POA: Diagnosis not present

## 2020-09-15 DIAGNOSIS — I5032 Chronic diastolic (congestive) heart failure: Secondary | ICD-10-CM | POA: Diagnosis not present

## 2020-09-15 MED ORDER — HYDROCODONE-ACETAMINOPHEN 5-325 MG PO TABS
1.0000 | ORAL_TABLET | ORAL | Status: DC | PRN
  Administered 2020-09-15 – 2020-09-17 (×7): 1 via ORAL
  Filled 2020-09-15 (×7): qty 1

## 2020-09-15 NOTE — Care Management (Signed)
Bedding change with NT small skin breakdown in crack of buttocks applied skin barrier cream, a sacral mexiplex and a square one under. Trouble sticking to skin pt said her bowels were moving so left in place until finished communicated with night nurse at shift change. Keanu Lesniak  1902 09/15/2020

## 2020-09-15 NOTE — Progress Notes (Signed)
Patient ID: [name], female   DOB: 03-08-1922, 87 y.o.   MRN: [id number]  PROGRESS NOTE    [name]  TDD:[id number] DOB: March 03, 1922 DOA: 09/12/2020 PCP: Aretta Nip, MD   Brief Narrative:  87 y.o. female with medical history significant of chronic atrial fibrillation, chronic diastolic heart failure, primary hypertension, osteoarthritis, history of breast cancer presented with mechanical fall and right shoulder pain.  On presentation, she was found to have mildly displaced spiral/oblique fracture of the proximal/mid right humeral diaphysis.  Orthopedics recommended conservative management.  PT recommended SNF placement.  Currently medically stable for discharge.  Assessment & Plan:   Right humerus fracture status post mechanical fall -found to have mildly displaced spiral/oblique fracture of the proximal/mid right humeral diaphysis.  Orthopedics recommended conservative management and outpatient orthopedic follow-up -PT recommended SNF placement.  Social worker following.  Chronic atrial fibrillation -Currently rate controlled.  Not on any AV nodal blocking agents because of AV nodal disease.  Off Coumadin many years ago because of GI bleed   Chronic diastolic heart failure -Compensated.  Continue torsemide.  Outpatient follow-up with cardiology   Hypertension -Stable.  Continue torsemide   Left knee osteoarthritis -Apparently left knee is inoperable.  Has history of right knee replacement.  Outpatient follow-up with sports medicine.  Leukocytosis -Resolved   DVT prophylaxis: Lovenox Code Status:  DNR, confirmed by the patient Family Communication: None at bedside Disposition Plan: Status is: Observation  The patient will require care spanning > 2 midnights and should be moved to inpatient because: Inpatient level of care appropriate due to severity of illness  Dispo: The patient is from: Home              Anticipated d/c is to: SNF.                Patient currently is medically stable to d/c.   Difficult to place patient No   Consultants: Orthopedics  Procedures: None  Antimicrobials: None   Subjective: Patient seen and examined at bedside.  Complains of intermittent right shoulder pain.  No worsening shortness of breath, fever or vomiting reported.   Objective: Vitals:   09/14/20 1324 09/14/20 2100 09/15/20 0438 09/15/20 0752  BP: (!) 125/47 (!) 114/44 (!) 117/50 (!) 119/51  Pulse: (!) 53 (!) 50 (!) 50 (!) 49  Resp: 20 20 16 20   Temp: 97.7 F (36.5 C) 97.9 F (36.6 C) 98.2 F (36.8 C) 97.8 F (36.6 C)  TempSrc: Oral Oral Oral Oral  SpO2: 97% 96% 92% 100%  Weight:      Height:        Intake/Output Summary (Last 24 hours) at 09/15/2020 6237 Last data filed at 09/15/2020 0500 Gross per 24 hour  Intake 720 ml  Output 500 ml  Net 220 ml    Filed Weights   09/12/20 1127  Weight: 104.3 kg    Examination:  General: Elderly female lying in the bed slightly hard of hearing.  No distress.  On room air.   Cardiovascular: Bradycardic; S1-S2 heard  respiratory: Bilateral decreased breath sounds at bases  abdominal: Soft, NT, distended slightly; bowel sounds are heard  extremities: No cyanosis or clubbing; right shoulder/arm is in splint   Data Reviewed: I have personally reviewed following labs and imaging studies  CBC: Recent Labs  Lab 09/12/20 1614 09/13/20 0435  WBC 13.2* 8.3  NEUTROABS 9.8*  --   HGB 12.7 11.7*  HCT 39.5 35.9*  MCV 96.6 95.2  PLT 221  492    Basic Metabolic Panel: Recent Labs  Lab 09/12/20 1614 09/13/20 0435  NA 142 140  K 4.6 3.3*  CL 104 101  CO2 29 29  GLUCOSE 108* 95  BUN 35* 32*  CREATININE 1.09* 0.88  CALCIUM 9.3 8.9    GFR: Estimated Creatinine Clearance: 47.5 mL/min (by C-G formula based on SCr of 0.88 mg/dL). Liver Function Tests: No results for input(s): AST, ALT, ALKPHOS, BILITOT, PROT, ALBUMIN in the last 168 hours. No results for input(s): LIPASE, AMYLASE  in the last 168 hours. No results for input(s): AMMONIA in the last 168 hours. Coagulation Profile: No results for input(s): INR, PROTIME in the last 168 hours. Cardiac Enzymes: No results for input(s): CKTOTAL, CKMB, CKMBINDEX, TROPONINI in the last 168 hours. BNP (last 3 results) No results for input(s): PROBNP in the last 8760 hours. HbA1C: No results for input(s): HGBA1C in the last 72 hours. CBG: No results for input(s): GLUCAP in the last 168 hours. Lipid Profile: No results for input(s): CHOL, HDL, LDLCALC, TRIG, CHOLHDL, LDLDIRECT in the last 72 hours. Thyroid Function Tests: No results for input(s): TSH, T4TOTAL, FREET4, T3FREE, THYROIDAB in the last 72 hours. Anemia Panel: No results for input(s): VITAMINB12, FOLATE, FERRITIN, TIBC, IRON, RETICCTPCT in the last 72 hours. Sepsis Labs: No results for input(s): PROCALCITON, LATICACIDVEN in the last 168 hours.  Recent Results (from the past 240 hour(s))  SARS CORONAVIRUS 2 (TAT 6-24 HRS) Nasopharyngeal Nasopharyngeal Swab     Status: None   Collection Time: 09/12/20  4:14 PM   Specimen: Nasopharyngeal Swab  Result Value Ref Range Status   SARS Coronavirus 2 NEGATIVE NEGATIVE Final    Comment: (NOTE) SARS-CoV-2 target nucleic acids are NOT DETECTED.  The SARS-CoV-2 RNA is generally detectable in upper and lower respiratory specimens during the acute phase of infection. Negative results do not preclude SARS-CoV-2 infection, do not rule out co-infections with other pathogens, and should not be used as the sole basis for treatment or other patient management decisions. Negative results must be combined with clinical observations, patient history, and epidemiological information. The expected result is Negative.  Fact Sheet for Patients: SugarRoll.be  Fact Sheet for Healthcare Providers: https://www.woods-mathews.com/  This test is not yet approved or cleared by the [place]  FDA and  has been authorized for detection and/or diagnosis of SARS-CoV-2 by FDA under an Emergency Use Authorization (EUA). This EUA will remain  in effect (meaning this test can be used) for the duration of the COVID-19 declaration under Se ction 564(b)(1) of the Act, 21 U.S.C. section 360bbb-3(b)(1), unless the authorization is terminated or revoked sooner.  Performed at [facility], [place] [address] [place], [place] [address]           Radiology Studies: No results found.      Scheduled Meds:  enoxaparin (LOVENOX) injection  40 mg Subcutaneous Q24H   polyethylene glycol  17 g Oral BID   torsemide  40 mg Oral Daily   Continuous Infusions:        Aline August, MD [facility] 09/15/2020, 8:21 AM

## 2020-09-15 NOTE — Progress Notes (Signed)
Orthopaedic Trauma Service (OTS)      Subjective: Patient reports pain as 8 on 0-10 scale and has been tolerating narcotic well. Response only 4-5 hours. Pain in left leg and knee which is primarily chronic.  Denies tingling or numbness in hand.    Objective: Current Vitals Blood pressure (!) 119/51, pulse (!) 49, temperature 97.8 F (36.6 C), temperature source Oral, resp. rate 20, height 5\' 7"  (1.702 m), weight 104.3 kg, SpO2 100 %. Vital signs in last 24 hours: Temp:  [97.5 F (36.4 C)-98.2 F (36.8 C)] 97.8 F (36.6 C) (07/17 0752) Pulse Rate:  [49-62] 49 (07/17 0752) Resp:  [16-20] 20 (07/17 0752) BP: (114-125)/(44-51) 119/51 (07/17 0752) SpO2:  [92 %-100 %] 100 % (07/17 0752)  Intake/Output from previous day: 07/16 0701 - 07/17 0700 In: 720 [P.O.:720] Out: 500 [Urine:500]  LABS Recent Labs    09/12/20 1614 09/13/20 0435  HGB 12.7 11.7*   Recent Labs    09/12/20 1614 09/13/20 0435  WBC 13.2* 8.3  RBC 4.09 3.77*  HCT 39.5 35.9*  PLT 221 206   Recent Labs    09/12/20 1614 09/13/20 0435  NA 142 140  K 4.6 3.3*  CL 104 101  CO2 29 29  BUN 35* 32*  CREATININE 1.09* 0.88  GLUCOSE 108* 95  CALCIUM 9.3 8.9   No results for input(s): LABPT, INR in the last 72 hours.   Physical Exam RUE Splint in place  Sens  Ax/R/M/U intact  Mot   Ax/ R/ PIN/ M/ AIN/ U intact  Brisk CR LLE Valgus deformity  Edema/ swelling controlled and appears chronic, no erythema  Fixed equinus  RLE  fixed equinus  Brisk cap refill, warm to touch  Assessment/Plan:    1. PT/OT  2. DVT proph per primary service 3. Will switch to Sarmiento cuff on the right humerus tomorrow 4. Will change Norco schedule from q8 to q4-6 hr 5. F/u in office in next 10-14 days  Altamese Gulf, MD [facility], [facility] [phone]

## 2020-09-15 NOTE — Progress Notes (Signed)
Orthopedic Tech Progress Note Patient Details:  [name] 02-Dec-1922 [id number]  Ortho Devices Type of Ortho Device: Coapt Ortho Device/Splint Location: right Ortho Device/Splint Interventions: Ordered, Application, Adjustment   Post Interventions Patient Tolerated: Fair Instructions Provided: Adjustment of device  [name] 09/15/2020, 11:32 AMCalled [facility] for right humeral shaft fracture brace.

## 2020-09-16 DIAGNOSIS — I5032 Chronic diastolic (congestive) heart failure: Secondary | ICD-10-CM | POA: Diagnosis not present

## 2020-09-16 DIAGNOSIS — I482 Chronic atrial fibrillation, unspecified: Secondary | ICD-10-CM | POA: Diagnosis not present

## 2020-09-16 DIAGNOSIS — S42294A Other nondisplaced fracture of upper end of right humerus, initial encounter for closed fracture: Secondary | ICD-10-CM | POA: Diagnosis not present

## 2020-09-16 DIAGNOSIS — I10 Essential (primary) hypertension: Secondary | ICD-10-CM | POA: Diagnosis not present

## 2020-09-16 MED ORDER — SENNOSIDES-DOCUSATE SODIUM 8.6-50 MG PO TABS
1.0000 | ORAL_TABLET | Freq: Two times a day (BID) | ORAL | Status: DC
  Administered 2020-09-16 – 2020-09-18 (×6): 1 via ORAL
  Filled 2020-09-16 (×12): qty 1

## 2020-09-16 MED ORDER — BISACODYL 10 MG RE SUPP
10.0000 mg | Freq: Every day | RECTAL | Status: DC | PRN
  Administered 2020-09-17: 10 mg via RECTAL
  Filled 2020-09-16: qty 1

## 2020-09-16 NOTE — TOC Progression Note (Signed)
Transition of Care [facility]) - Progression Note    Patient Details  Name: [name] MRN: [id number] Date of Birth: 19-Apr-1922  Transition of Care [facility]) CM/SW [place], [place] Phone Number: 09/16/2020, 3:16 PM  Clinical Narrative:   Patient denied authorization by insurance, denial upheld after Peer to peer.  Gave information to Angelica Chessman, nephew and Jan, friend.  They will appeal decision-given number to call for appeal.  Awaiting results of appeal. TOC will continue to follow during the course of hospitalization.     Expected Discharge Plan: Skilled Nursing Facility Barriers to Discharge: SNF Pending bed offer  Expected Discharge Plan and Services Expected Discharge Plan: [place]   Discharge Planning Services: CM Consult Post Acute Care Choice: [place] Living arrangements for the past 2 months: Single Family Home                                       Social Determinants of Health (SDOH) Interventions    Readmission Risk Interventions No flowsheet data found.

## 2020-09-16 NOTE — Progress Notes (Signed)
Patient ID: [name], female   DOB: 1923-02-12, 87 y.o.   MRN: [id number]  PROGRESS NOTE    [name]  DVV:[id number] DOB: April 23, 1922 DOA: 09/12/2020 PCP: Aretta Nip, MD   Brief Narrative:  87 y.o. female with medical history significant of chronic atrial fibrillation, chronic diastolic heart failure, primary hypertension, osteoarthritis, history of breast cancer presented with mechanical fall and right shoulder pain.  On presentation, she was found to have mildly displaced spiral/oblique fracture of the proximal/mid right humeral diaphysis.  Orthopedics recommended conservative management.  PT recommended SNF placement.  Currently medically stable for discharge.  Assessment & Plan:   Right humerus fracture status post mechanical fall -found to have mildly displaced spiral/oblique fracture of the proximal/mid right humeral diaphysis.  Orthopedics recommended conservative management and outpatient orthopedic follow-up -PT recommended SNF placement.  Social worker following.  Chronic atrial fibrillation -Currently rate controlled.  Not on any AV nodal blocking agents because of AV nodal disease.  Off Coumadin many years ago because of GI bleed   Chronic diastolic heart failure -Compensated.  Continue torsemide.  Outpatient follow-up with cardiology   Hypertension -Stable.  Continue torsemide   Left knee osteoarthritis -Apparently left knee is inoperable.  Has history of right knee replacement.  Outpatient follow-up with sports medicine.  Leukocytosis -Resolved   DVT prophylaxis: Lovenox Code Status:  DNR, confirmed by the patient Family Communication: None at bedside Disposition Plan: Status is: Observation  The patient will require care spanning > 2 midnights and should be moved to inpatient because: Inpatient level of care appropriate due to severity of illness  Dispo: The patient is from: Home              Anticipated d/c is to: SNF.                Patient currently is medically stable to d/c.   Difficult to place patient No   Consultants: Orthopedics  Procedures: None  Antimicrobials: None   Subjective: Patient seen and examined at bedside. Doesn't feel well; complains of constipation and shoulder pain. No fever or vomiting reported. Objective: Vitals:   09/15/20 1313 09/15/20 1634 09/15/20 2032 09/16/20 0558  BP: 124/61  (!) 117/45 (!) 123/53  Pulse: (!) 52  (!) 50 67  Resp: 18  16 18   Temp: (!) 97.5 F (36.4 C)  97.6 F (36.4 C) 98.7 F (37.1 C)  TempSrc: Oral  Oral Oral  SpO2: (!) 88% 93% 94% 93%  Weight:      Height:        Intake/Output Summary (Last 24 hours) at 09/16/2020 0944 Last data filed at 09/16/2020 0752 Gross per 24 hour  Intake 480 ml  Output 1300 ml  Net -820 ml    Filed Weights   09/12/20 1127  Weight: 104.3 kg    Examination:  General: Elderly female lying in the bed slightly hard of hearing.  Currently on room air. No acute distress. Cardiovascular: S1-S2 heard; intermittently bradycardic respiratory: Decreased breath sounds at bases bilaterally abdominal: Soft, NT, slightly distended; normal bowel sounds are heard extremities: no edema or clubbing; right shoulder/arm is in splint   Data Reviewed: I have personally reviewed following labs and imaging studies  CBC: Recent Labs  Lab 09/12/20 1614 09/13/20 0435  WBC 13.2* 8.3  NEUTROABS 9.8*  --   HGB 12.7 11.7*  HCT 39.5 35.9*  MCV 96.6 95.2  PLT 221 626    Basic Metabolic Panel: Recent Labs  Lab 09/12/20  1614 09/13/20 0435  NA 142 140  K 4.6 3.3*  CL 104 101  CO2 29 29  GLUCOSE 108* 95  BUN 35* 32*  CREATININE 1.09* 0.88  CALCIUM 9.3 8.9    GFR: Estimated Creatinine Clearance: 47.5 mL/min (by C-G formula based on SCr of 0.88 mg/dL). Liver Function Tests: No results for input(s): AST, ALT, ALKPHOS, BILITOT, PROT, ALBUMIN in the last 168 hours. No results for input(s): LIPASE, AMYLASE in the last 168 hours. No  results for input(s): AMMONIA in the last 168 hours. Coagulation Profile: No results for input(s): INR, PROTIME in the last 168 hours. Cardiac Enzymes: No results for input(s): CKTOTAL, CKMB, CKMBINDEX, TROPONINI in the last 168 hours. BNP (last 3 results) No results for input(s): PROBNP in the last 8760 hours. HbA1C: No results for input(s): HGBA1C in the last 72 hours. CBG: No results for input(s): GLUCAP in the last 168 hours. Lipid Profile: No results for input(s): CHOL, HDL, LDLCALC, TRIG, CHOLHDL, LDLDIRECT in the last 72 hours. Thyroid Function Tests: No results for input(s): TSH, T4TOTAL, FREET4, T3FREE, THYROIDAB in the last 72 hours. Anemia Panel: No results for input(s): VITAMINB12, FOLATE, FERRITIN, TIBC, IRON, RETICCTPCT in the last 72 hours. Sepsis Labs: No results for input(s): PROCALCITON, LATICACIDVEN in the last 168 hours.  Recent Results (from the past 240 hour(s))  SARS CORONAVIRUS 2 (TAT 6-24 HRS) Nasopharyngeal Nasopharyngeal Swab     Status: None   Collection Time: 09/12/20  4:14 PM   Specimen: Nasopharyngeal Swab  Result Value Ref Range Status   SARS Coronavirus 2 NEGATIVE NEGATIVE Final    Comment: (NOTE) SARS-CoV-2 target nucleic acids are NOT DETECTED.  The SARS-CoV-2 RNA is generally detectable in upper and lower respiratory specimens during the acute phase of infection. Negative results do not preclude SARS-CoV-2 infection, do not rule out co-infections with other pathogens, and should not be used as the sole basis for treatment or other patient management decisions. Negative results must be combined with clinical observations, patient history, and epidemiological information. The expected result is Negative.  Fact Sheet for Patients: SugarRoll.be  Fact Sheet for Healthcare Providers: https://www.woods-mathews.com/  This test is not yet approved or cleared by the [place] FDA and  has been  authorized for detection and/or diagnosis of SARS-CoV-2 by FDA under an Emergency Use Authorization (EUA). This EUA will remain  in effect (meaning this test can be used) for the duration of the COVID-19 declaration under Se ction 564(b)(1) of the Act, 21 U.S.C. section 360bbb-3(b)(1), unless the authorization is terminated or revoked sooner.  Performed at [facility], [place] [address], [place], [place] [address]           Radiology Studies: No results found.      Scheduled Meds:  enoxaparin (LOVENOX) injection  40 mg Subcutaneous Q24H   polyethylene glycol  17 g Oral BID   senna-docusate  1 tablet Oral BID   torsemide  40 mg Oral Daily   Continuous Infusions:        Aline August, MD [facility] 09/16/2020, 9:44 AM

## 2020-09-16 NOTE — Progress Notes (Signed)
Physical Therapy Treatment Patient Details Name: [name] MRN: [id number] DOB: 1922-05-16 Today's Date: 09/16/2020    History of Present Illness [name] is a 87 y.o. female with medical history significant of atrial fibrillation, diastolic heart failure, primary hypertension, osteoarthritis, history of breast cancer.  Patient fell transferring to toilet and injuring right arm. Patient found to have right humeral fracture. Being treated conservatively. NWB with sling.    PT Comments    The patient worked with PT on LE exercises. Bed placed in  chair position for patient to work on leaning forward. Able to perform with left rail and without once bed also tilted forward. Patient performed x 4 . Increased  R ight  shoulder pain reported, patient has medication prior to therapy.  Continue mobility with PT  Follow Up Recommendations  SNF     Equipment Recommendations  None recommended by PT    Recommendations for Other Services       Precautions / Restrictions Precautions Precautions: Fall Precaution Comments: NWB RUE, left knee/shin pain Restrictions RUE Weight Bearing: Non weight bearing    Mobility  Bed Mobility               General bed mobility comments: Placed bed in chair position, tilted bed forward. Patient worked  on sitting forward, able to pull forward with left rail and then withno raill. patient performed x  4. Able to reach LUE down to mid calf and hold for  ~ 30 secs before resting.    Transfers                 General transfer comment: NT  Ambulation/Gait                 Stairs             Wheelchair Mobility    Modified Rankin (Stroke Patients Only)       Balance                                            Cognition Arousal/Alertness: Awake/alert Behavior During Therapy: WFL for tasks assessed/performed                                          Exercises General  Exercises - Lower Extremity Ankle Circles/Pumps: AROM;Both;10 reps Long Arc Quad: AROM;AAROM;Both;10 reps;Seated Heel Slides: AAROM;Both;10 reps;Supine Straight Leg Raises: AAROM;Left;10 reps;Supine    General Comments        Pertinent Vitals/Pain Pain Score: 7  Pain Location: R arm, L knee Pain Descriptors / Indicators: Grimacing;Aching;Sharp;Guarding;Throbbing Pain Intervention(s): Monitored during session;Premedicated before session    Home Living                      Prior Function            PT Goals (current goals can now be found in the care plan section)      Frequency    Min 2X/week      PT Plan Current plan remains appropriate    Co-evaluation              AM-PAC PT "6 Clicks" Mobility   Outcome Measure  Help needed turning from your back to your side while in a flat  bed without using bedrails?: Total Help needed moving from lying on your back to sitting on the side of a flat bed without using bedrails?: Total Help needed moving to and from a bed to a chair (including a wheelchair)?: Total Help needed standing up from a chair using your arms (e.g., wheelchair or bedside chair)?: Total Help needed to walk in hospital room?: Total Help needed climbing 3-5 steps with a railing? : Total 6 Click Score: 6    End of Session Equipment Utilized During Treatment: Gait belt Activity Tolerance: Patient limited by pain Patient left: in bed;with call bell/phone within reach;with bed alarm set Nurse Communication: Mobility status PT Visit Diagnosis: Muscle weakness (generalized) (M62.81);Unsteadiness on feet (R26.81);Other abnormalities of gait and mobility (R26.89);History of falling (Z91.81);Pain Pain - Right/Left: Right Pain - part of body: Arm     Time: 1411-1439 PT Time Calculation (min) (ACUTE ONLY): 28 min  Charges:  $Therapeutic Exercise: 8-22 mins $Therapeutic Activity: 23-37 mins                       [name] 09/16/2020, 3:52 PM

## 2020-09-16 NOTE — Progress Notes (Signed)
Patient called out requesting for a foot massage. NT rubbed the patient's foot. Patient became upset because NT wasn't massaging her feet the correct. 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 Patient told NT to leave her room. Patient is dry, and has been turned. Will continue to monitor.

## 2020-09-17 DIAGNOSIS — Z888 Allergy status to other drugs, medicaments and biological substances status: Secondary | ICD-10-CM | POA: Diagnosis not present

## 2020-09-17 DIAGNOSIS — S42309A Unspecified fracture of shaft of humerus, unspecified arm, initial encounter for closed fracture: Secondary | ICD-10-CM | POA: Diagnosis present

## 2020-09-17 DIAGNOSIS — Z9119 Patient's noncompliance with other medical treatment and regimen: Secondary | ICD-10-CM | POA: Diagnosis not present

## 2020-09-17 DIAGNOSIS — Z79899 Other long term (current) drug therapy: Secondary | ICD-10-CM | POA: Diagnosis not present

## 2020-09-17 DIAGNOSIS — Z882 Allergy status to sulfonamides status: Secondary | ICD-10-CM | POA: Diagnosis not present

## 2020-09-17 DIAGNOSIS — W1830XA Fall on same level, unspecified, initial encounter: Secondary | ICD-10-CM | POA: Diagnosis present

## 2020-09-17 DIAGNOSIS — M170 Bilateral primary osteoarthritis of knee: Secondary | ICD-10-CM | POA: Diagnosis present

## 2020-09-17 DIAGNOSIS — Z96651 Presence of right artificial knee joint: Secondary | ICD-10-CM | POA: Diagnosis present

## 2020-09-17 DIAGNOSIS — R609 Edema, unspecified: Secondary | ICD-10-CM | POA: Diagnosis not present

## 2020-09-17 DIAGNOSIS — Z66 Do not resuscitate: Secondary | ICD-10-CM | POA: Diagnosis present

## 2020-09-17 DIAGNOSIS — Z8249 Family history of ischemic heart disease and other diseases of the circulatory system: Secondary | ICD-10-CM | POA: Diagnosis not present

## 2020-09-17 DIAGNOSIS — Z993 Dependence on wheelchair: Secondary | ICD-10-CM | POA: Diagnosis not present

## 2020-09-17 DIAGNOSIS — Z853 Personal history of malignant neoplasm of breast: Secondary | ICD-10-CM | POA: Diagnosis not present

## 2020-09-17 DIAGNOSIS — I5032 Chronic diastolic (congestive) heart failure: Secondary | ICD-10-CM | POA: Diagnosis present

## 2020-09-17 DIAGNOSIS — S42294A Other nondisplaced fracture of upper end of right humerus, initial encounter for closed fracture: Secondary | ICD-10-CM | POA: Diagnosis not present

## 2020-09-17 DIAGNOSIS — I10 Essential (primary) hypertension: Secondary | ICD-10-CM | POA: Diagnosis not present

## 2020-09-17 DIAGNOSIS — S42341A Displaced spiral fracture of shaft of humerus, right arm, initial encounter for closed fracture: Secondary | ICD-10-CM | POA: Diagnosis present

## 2020-09-17 DIAGNOSIS — E876 Hypokalemia: Secondary | ICD-10-CM | POA: Diagnosis present

## 2020-09-17 DIAGNOSIS — Y92009 Unspecified place in unspecified non-institutional (private) residence as the place of occurrence of the external cause: Secondary | ICD-10-CM | POA: Diagnosis not present

## 2020-09-17 DIAGNOSIS — Z885 Allergy status to narcotic agent status: Secondary | ICD-10-CM | POA: Diagnosis not present

## 2020-09-17 DIAGNOSIS — Z20822 Contact with and (suspected) exposure to covid-19: Secondary | ICD-10-CM | POA: Diagnosis present

## 2020-09-17 DIAGNOSIS — K5900 Constipation, unspecified: Secondary | ICD-10-CM | POA: Diagnosis present

## 2020-09-17 DIAGNOSIS — I482 Chronic atrial fibrillation, unspecified: Secondary | ICD-10-CM | POA: Diagnosis present

## 2020-09-17 DIAGNOSIS — Z823 Family history of stroke: Secondary | ICD-10-CM | POA: Diagnosis not present

## 2020-09-17 DIAGNOSIS — Z7901 Long term (current) use of anticoagulants: Secondary | ICD-10-CM | POA: Diagnosis not present

## 2020-09-17 DIAGNOSIS — R001 Bradycardia, unspecified: Secondary | ICD-10-CM | POA: Diagnosis present

## 2020-09-17 DIAGNOSIS — I110 Hypertensive heart disease with heart failure: Secondary | ICD-10-CM | POA: Diagnosis present

## 2020-09-17 MED ORDER — HYDROCODONE-ACETAMINOPHEN 5-325 MG PO TABS
1.0000 | ORAL_TABLET | ORAL | Status: DC | PRN
  Administered 2020-09-18: 1 via ORAL
  Administered 2020-09-18: 2 via ORAL
  Administered 2020-09-19 – 2020-09-23 (×6): 1 via ORAL
  Filled 2020-09-17 (×9): qty 1

## 2020-09-17 MED ORDER — TRAMADOL HCL 50 MG PO TABS
50.0000 mg | ORAL_TABLET | Freq: Four times a day (QID) | ORAL | Status: DC | PRN
  Administered 2020-09-17 – 2020-09-18 (×4): 50 mg via ORAL
  Filled 2020-09-17 (×5): qty 1

## 2020-09-17 NOTE — Progress Notes (Signed)
Patient ID: AYMEE FOMBY, female   DOB: Nov 19, 1922, 87 y.o.   MRN: [id number]  PROGRESS NOTE    MARTRICE APT  OXB:[id number] DOB: December 03, 1922 DOA: 09/12/2020 PCP: Aretta Nip, MD   Brief Narrative:  87 y.o. female with medical history significant of chronic atrial fibrillation, chronic diastolic heart failure, primary hypertension, osteoarthritis, history of breast cancer presented with mechanical fall and right shoulder pain.  On presentation, she was found to have mildly displaced spiral/oblique fracture of the proximal/mid right humeral diaphysis.  Orthopedics recommended conservative management.  PT recommended SNF placement.  Currently medically stable for discharge.  Insurance denied SNF placement despite peer-to-peer.  Family has appealed the discharge process.  Assessment & Plan:   Right humerus fracture status post mechanical fall -found to have mildly displaced spiral/oblique fracture of the proximal/mid right humeral diaphysis.  Orthopedics recommended conservative management and outpatient orthopedic follow-up -PT recommended SNF placement.  Insurance denied SNF placement despite peer-to-peer.  Family has appealed the discharge process. Social worker following.  Chronic atrial fibrillation -Currently rate controlled.  Not on any AV nodal blocking agents because of AV nodal disease.  Off Coumadin many years ago because of GI bleed   Chronic diastolic heart failure -Compensated.  Continue torsemide.  Outpatient follow-up with cardiology   Hypertension -Stable.  Continue torsemide   Left knee osteoarthritis -Apparently left knee is inoperable.  Has history of right knee replacement.  Outpatient follow-up with sports medicine.  Leukocytosis -Resolved  Constipation -Continue stool softeners and laxatives.   DVT prophylaxis: Lovenox Code Status:  DNR, confirmed by the patient Family Communication: None at bedside Disposition Plan: Status is:  Observation  The patient will require care spanning > 2 midnights and should be moved to inpatient because: Inpatient level of care appropriate due to severity of illness  Dispo: The patient is from: Home              Anticipated d/c is to: Home pending appeal process              Patient currently is medically stable to d/c.   Difficult to place patient No   Consultants: Orthopedics  Procedures: None  Antimicrobials: None   Subjective: Patient seen and examined at bedside.  No worsening shortness breath, fever or vomiting reported.  Still constipated.  Complains of intermittent right shoulder and left knee pain.   Objective: Vitals:   09/16/20 0558 09/16/20 1408 09/16/20 2100 09/17/20 0518  BP: (!) 123/53 (!) 126/50 (!) 139/55 (!) 144/60  Pulse: 67  (!) 53 (!) 58  Resp: 18 20 18 17   Temp: 98.7 F (37.1 C) 98.2 F (36.8 C) 98.2 F (36.8 C) 98.1 F (36.7 C)  TempSrc: Oral Oral Oral Oral  SpO2: 93% 92% 96% 94%  Weight:      Height:        Intake/Output Summary (Last 24 hours) at 09/17/2020 1009 Last data filed at 09/17/2020 0820 Gross per 24 hour  Intake 840 ml  Output 1750 ml  Net -910 ml    Filed Weights   09/12/20 1127  Weight: 104.3 kg    Examination:  General: Elderly female lying in the bed slightly hard of hearing.  No distress.  On room air currently.   Cardiovascular: Bradycardic; S1-S2 heard respiratory: Bilateral decreased breath sounds at bases abdominal: Soft, NT, distended mildly; bowel sounds are heard  extremities: No cyanosis or clubbing; right shoulder/arm is in splint   Data Reviewed: I have personally reviewed following labs  and imaging studies  CBC: Recent Labs  Lab 09/12/20 1614 09/13/20 0435  WBC 13.2* 8.3  NEUTROABS 9.8*  --   HGB 12.7 11.7*  HCT 39.5 35.9*  MCV 96.6 95.2  PLT 221 859    Basic Metabolic Panel: Recent Labs  Lab 09/12/20 1614 09/13/20 0435  NA 142 140  K 4.6 3.3*  CL 104 101  CO2 29 29  GLUCOSE 108*  95  BUN 35* 32*  CREATININE 1.09* 0.88  CALCIUM 9.3 8.9    GFR: Estimated Creatinine Clearance: 47.5 mL/min (by C-G formula based on SCr of 0.88 mg/dL). Liver Function Tests: No results for input(s): AST, ALT, ALKPHOS, BILITOT, PROT, ALBUMIN in the last 168 hours. No results for input(s): LIPASE, AMYLASE in the last 168 hours. No results for input(s): AMMONIA in the last 168 hours. Coagulation Profile: No results for input(s): INR, PROTIME in the last 168 hours. Cardiac Enzymes: No results for input(s): CKTOTAL, CKMB, CKMBINDEX, TROPONINI in the last 168 hours. BNP (last 3 results) No results for input(s): PROBNP in the last 8760 hours. HbA1C: No results for input(s): HGBA1C in the last 72 hours. CBG: No results for input(s): GLUCAP in the last 168 hours. Lipid Profile: No results for input(s): CHOL, HDL, LDLCALC, TRIG, CHOLHDL, LDLDIRECT in the last 72 hours. Thyroid Function Tests: No results for input(s): TSH, T4TOTAL, FREET4, T3FREE, THYROIDAB in the last 72 hours. Anemia Panel: No results for input(s): VITAMINB12, FOLATE, FERRITIN, TIBC, IRON, RETICCTPCT in the last 72 hours. Sepsis Labs: No results for input(s): PROCALCITON, LATICACIDVEN in the last 168 hours.  Recent Results (from the past 240 hour(s))  SARS CORONAVIRUS 2 (TAT 6-24 HRS) Nasopharyngeal Nasopharyngeal Swab     Status: None   Collection Time: 09/12/20  4:14 PM   Specimen: Nasopharyngeal Swab  Result Value Ref Range Status   SARS Coronavirus 2 NEGATIVE NEGATIVE Final    Comment: (NOTE) SARS-CoV-2 target nucleic acids are NOT DETECTED.  The SARS-CoV-2 RNA is generally detectable in upper and lower respiratory specimens during the acute phase of infection. Negative results do not preclude SARS-CoV-2 infection, do not rule out co-infections with other pathogens, and should not be used as the sole basis for treatment or other patient management decisions. Negative results must be combined with clinical  observations, patient history, and epidemiological information. The expected result is Negative.  Fact Sheet for Patients: SugarRoll.be  Fact Sheet for Healthcare Providers: https://www.woods-mathews.com/  This test is not yet approved or cleared by the [place] FDA and  has been authorized for detection and/or diagnosis of SARS-CoV-2 by FDA under an Emergency Use Authorization (EUA). This EUA will remain  in effect (meaning this test can be used) for the duration of the COVID-19 declaration under Se ction 564(b)(1) of the Act, 21 U.S.C. section 360bbb-3(b)(1), unless the authorization is terminated or revoked sooner.  Performed at [facility], [place] [address] [place], [place] [address]           Radiology Studies: No results found.      Scheduled Meds:  enoxaparin (LOVENOX) injection  40 mg Subcutaneous Q24H   polyethylene glycol  17 g Oral BID   senna-docusate  1 tablet Oral BID   torsemide  40 mg Oral Daily   Continuous Infusions:        Aline August, MD [facility] 09/17/2020, 10:09 AM

## 2020-09-17 NOTE — Progress Notes (Signed)
Patient ringing the call bell throughout the night c/o left foot pain that feel like pins and needles. This nurse and CNA wanted to reposition patient but she refused. She also did not want to be checked for a BM. Patient wanted staff to stay in her room and rub her left foot all night until it stopped hurting. This nurse offered to call the on-call NP to discuss the foot pain, patient refused. Will continue to monitor the patient.

## 2020-09-18 DIAGNOSIS — S42294A Other nondisplaced fracture of upper end of right humerus, initial encounter for closed fracture: Secondary | ICD-10-CM | POA: Diagnosis not present

## 2020-09-18 DIAGNOSIS — I482 Chronic atrial fibrillation, unspecified: Secondary | ICD-10-CM | POA: Diagnosis not present

## 2020-09-18 DIAGNOSIS — I10 Essential (primary) hypertension: Secondary | ICD-10-CM | POA: Diagnosis not present

## 2020-09-18 DIAGNOSIS — I5032 Chronic diastolic (congestive) heart failure: Secondary | ICD-10-CM | POA: Diagnosis not present

## 2020-09-18 MED ORDER — PANTOPRAZOLE SODIUM 40 MG PO TBEC
40.0000 mg | DELAYED_RELEASE_TABLET | Freq: Every day | ORAL | Status: DC
  Administered 2020-09-18 – 2020-09-24 (×7): 40 mg via ORAL
  Filled 2020-09-18 (×7): qty 1

## 2020-09-18 MED ORDER — ENOXAPARIN SODIUM 40 MG/0.4ML IJ SOSY
40.0000 mg | PREFILLED_SYRINGE | INTRAMUSCULAR | Status: DC
  Administered 2020-09-18 – 2020-09-23 (×6): 40 mg via SUBCUTANEOUS
  Filled 2020-09-18 (×6): qty 0.4

## 2020-09-18 MED ORDER — DICLOFENAC SODIUM 1 % EX GEL
2.0000 g | Freq: Four times a day (QID) | CUTANEOUS | Status: DC
  Administered 2020-09-18 – 2020-09-24 (×20): 2 g via TOPICAL
  Filled 2020-09-18: qty 100

## 2020-09-18 MED ORDER — ENOXAPARIN SODIUM 60 MG/0.6ML IJ SOSY: 50.0000 mg | PREFILLED_SYRINGE | Freq: Every day | INTRAMUSCULAR | Status: DC

## 2020-09-18 MED ORDER — ACETAMINOPHEN 325 MG PO TABS
650.0000 mg | ORAL_TABLET | Freq: Four times a day (QID) | ORAL | Status: DC
  Administered 2020-09-18 – 2020-09-24 (×23): 650 mg via ORAL
  Filled 2020-09-18 (×23): qty 2

## 2020-09-18 NOTE — Progress Notes (Addendum)
PROGRESS NOTE    [name]  JAS:[id number] DOB: 10/16/1922 DOA: 09/12/2020 PCP: Aretta Nip, MD    Brief Narrative:  [name] was admitted to the hospital with the working diagnosis of right humeral fracture.   87 year old female past medical history for atrial fibrillation, diastolic heart failure, hypertension, osteoarthritis and breast cancer who presented after a mechanical fall. When attempting to transfer from a walker to commode., she fell injuring her right arm, this prompted her to come to the hospital.  She uses a wheelchair for transportation.  On her initial physical examination temperature 97.9, heart rate 51, respiratory rate 18, blood pressure 142/69, oxygen saturation 100% on room air.  Her lungs were clear to auscultation bilaterally, heart S1-S2, present, rhythmic, soft abdomen, positive lower extremity edema, right arm in splint.    Sodium 142, potassium 4.6, chloride 104, bicarb 29, glucose 108, BUN 35, creatinine 1.09, white count 13.2 hemoglobin 12.7, hematocrit 39.5, platelets 221. SARS COVID-19 negative.  Right humerus radiograph with mildly displaced and angulated oblique versus spiral fracture of proximal to mid right humeral diaphysis.  Osseous demineralization.  Advanced right glenohumeral degenerative changes.  She was admitted to the medical ward for further pain control.  Orthopedic consultation recommended conservative management.  Patient has been evaluated by physical therapy, recommended SNF that currently health insurance has been denied.  She has appealed [name] of denial, pending final decision.  Assessment & Plan:   Principal Problem:   Humerus fracture Active Problems:   Essential hypertension   Atrial fibrillation (HCC)   Diastolic heart failure (HCC)   Primary osteoarthritis of left knee   Right humeral fracture/ bilateral knee osteoarthritis continue pain control with good toleration.  Mobility limited by  left lower extremity pain, at the level of the knee. Continue pending health insurance authorization for SNF.   Add local diclofenac to left knee for pain control.  Change acetaminophen to scheduled q 6 hrs.   2. Chronic atrial fibrillation. Heart rate has been controlled, she had AV nodal disease. Not candidate for anticoagulation due to ambulatory dysfunction and high fall risk.  OK to hold on telemetry.   3. Chronic diastolic heart failure/ HTN. No clinical signs of exacerbation. Blood pressure this am 128.62 mmHg.  Continue blood pressure monitoring. Old records personally reviewed, cardiology office visit on 08/2017 mentioned patient not compliant with salt restricted diet, and decision to continue with torsemide 40 mg daily.  Echocardiogram from 2015 with preserved LV systolic function 60 to 41%.   To prevent electrolyte abnormalities will hold on torsemide. Check electrolytes in am.   4. Resolved leukocytosis.   5. Constipation. Continue bowel regimen,     Status is: Inpatient  Remains inpatient appropriate because:Inpatient level of care appropriate due to severity of illness  Dispo: The patient is from: Home              Anticipated d/c is to: SNF              Patient currently medically stable    Difficult to place patient No   DVT prophylaxis: Enoxaparin   Code Status:   DNR   Family Communication:  I spoke with patient's daughter at the bedside, we talked in detail about patient's condition, plan of care and prognosis and all questions were addressed.     Consultants:  Orthopedics   Subjective: Patient is feeling better, but continue to be very weak and deconditioned, not yet back to her baseline,. Positive left knee  pain. No nausea or vomiting.   Objective: Vitals:   09/17/20 2001 09/17/20 2002 09/17/20 2003 09/18/20 0554  BP: (!) 134/43 129/60 129/60 128/62  Pulse: (!) 25 77 78 60  Resp: 20   20  Temp: 97.6 F (36.4 C)   98.7 F (37.1 C)  TempSrc:  Oral   Oral  SpO2: (!) 87%  (!) 88% 94%  Weight:      Height:        Intake/Output Summary (Last 24 hours) at 09/18/2020 1158 Last data filed at 09/18/2020 0947 Gross per 24 hour  Intake 580 ml  Output 1050 ml  Net -470 ml   Filed Weights   09/12/20 1127  Weight: 104.3 kg    Examination:   General: Not in pain or dyspnea, deconditioned  Neurology: Awake and alert, non focal  E ENT: mild pallor, no icterus, oral mucosa moist Cardiovascular: No JVD. S1-S2 present, rhythmic, no gallops, rubs, or murmurs. No lower extremity edema. Pulmonary: positive breath sounds bilaterally, adequate air movement, no wheezing, rhonchi or rales. Gastrointestinal. Abdomen soft and non tender Skin. No rashes Musculoskeletal: hypertrophic bilateral knees.  Right arm in a sling.     Data Reviewed: I have personally reviewed following labs and imaging studies  CBC: Recent Labs  Lab 09/12/20 1614 09/13/20 0435  WBC 13.2* 8.3  NEUTROABS 9.8*  --   HGB 12.7 11.7*  HCT 39.5 35.9*  MCV 96.6 95.2  PLT 221 768   Basic Metabolic Panel: Recent Labs  Lab 09/12/20 1614 09/13/20 0435  NA 142 140  K 4.6 3.3*  CL 104 101  CO2 29 29  GLUCOSE 108* 95  BUN 35* 32*  CREATININE 1.09* 0.88  CALCIUM 9.3 8.9   GFR: Estimated Creatinine Clearance: 47.5 mL/min (by C-G formula based on SCr of 0.88 mg/dL). Liver Function Tests: No results for input(s): AST, ALT, ALKPHOS, BILITOT, PROT, ALBUMIN in the last 168 hours. No results for input(s): LIPASE, AMYLASE in the last 168 hours. No results for input(s): AMMONIA in the last 168 hours. Coagulation Profile: No results for input(s): INR, PROTIME in the last 168 hours. Cardiac Enzymes: No results for input(s): CKTOTAL, CKMB, CKMBINDEX, TROPONINI in the last 168 hours. BNP (last 3 results) No results for input(s): PROBNP in the last 8760 hours. HbA1C: No results for input(s): HGBA1C in the last 72 hours. CBG: No results for input(s): GLUCAP in the  last 168 hours. Lipid Profile: No results for input(s): CHOL, HDL, LDLCALC, TRIG, CHOLHDL, LDLDIRECT in the last 72 hours. Thyroid Function Tests: No results for input(s): TSH, T4TOTAL, FREET4, T3FREE, THYROIDAB in the last 72 hours. Anemia Panel: No results for input(s): VITAMINB12, FOLATE, FERRITIN, TIBC, IRON, RETICCTPCT in the last 72 hours.    Radiology Studies: I have reviewed all of the imaging during this hospital visit personally     Scheduled Meds:  acetaminophen  650 mg Oral Q6H   enoxaparin (LOVENOX) injection  40 mg Subcutaneous Q24H   polyethylene glycol  17 g Oral BID   senna-docusate  1 tablet Oral BID   torsemide  40 mg Oral Daily   Continuous Infusions:   LOS: 1 day        Breah Joa Gerome Apley, MD

## 2020-09-18 NOTE — Progress Notes (Signed)
   09/17/20 2001  Assess: MEWS Score  Temp 97.6 F (36.4 C)  BP (!) 134/43  Pulse Rate (!) 25  Resp 20  SpO2 (!) 87 %  Assess: MEWS Score  MEWS Temp 0  MEWS Systolic 0  MEWS Pulse 2  MEWS RR 0  MEWS LOC 0  MEWS Score 2  MEWS Score Color Yellow  Assess: if the MEWS score is Yellow or Red  Were vital signs taken at a resting state? No  Focused Assessment No change from prior assessment  Does the patient meet 2 or more of the SIRS criteria? No  MEWS guidelines implemented *See Row Information* No, vital signs rechecked  Document  Progress note created (see row info) Yes  Assess: SIRS CRITERIA  SIRS Temperature  0  SIRS Pulse 0  SIRS Respirations  0  SIRS WBC 0  SIRS Score Sum  0  VS were not taken at rest per NT. Patient was moving around, and the pulse ox was off patients finger and when the NT put the pulse ox back on the dinamap validated before an accurate reading was measured for pulse and other VS. Rechecked immediately and VS are within normal range. Mews guidelines were not initiated.

## 2020-09-18 NOTE — Progress Notes (Signed)
Physical Therapy Treatment Patient Details Name: [name] MRN: [id number] DOB: 02-25-1923 Today's Date: 09/18/2020    History of Present Illness [name] is a 87 y.o. female with medical history significant of atrial fibrillation, diastolic heart failure, primary hypertension, osteoarthritis, history of breast cancer.  Patient fell transferring to toilet and injuring right arm. Patient found to have right humeral fracture. Being treated conservatively. NWB with sling.    PT Comments    Pt assisted with sitting EOB and performed a couple exercises.  Pt felt unable to stand today and will likely need lift for OOB to recliner at this time.  Pt pleasant and cooperative.  Pt assisted back to supine end of session and repositioned with pillows to comfort.  Continue to recommend SNF upon d/c.     Follow Up Recommendations  SNF     Equipment Recommendations  None recommended by PT    Recommendations for Other Services       Precautions / Restrictions Precautions Precautions: Fall Precaution Comments: NWB RUE, left knee/shin pain Required Braces or Orthoses: Sling Restrictions Weight Bearing Restrictions: Yes RUE Weight Bearing: Non weight bearing    Mobility  Bed Mobility Overal bed mobility: Needs Assistance Bed Mobility: Supine to Sit;Sit to Supine     Supine to sit: Total assist;+2 for physical assistance;+2 for safety/equipment;HOB elevated Sit to supine: Total assist;+2 for physical assistance;+2 for safety/equipment   General bed mobility comments: required total assist for upper and lower body movement and positioning to get to/from EOB    Transfers                 General transfer comment: NT  Ambulation/Gait                 Stairs             Wheelchair Mobility    Modified Rankin (Stroke Patients Only)       Balance Overall balance assessment: Needs assistance Sitting-balance support: Feet supported;Single  extremity supported Sitting balance-Leahy Scale: Poor Sitting balance - Comments: reliant on UE support, held bed rail                                    Cognition Arousal/Alertness: Awake/alert Behavior During Therapy: WFL for tasks assessed/performed Overall Cognitive Status: Within Functional Limits for tasks assessed                                        Exercises General Exercises - Lower Extremity Long Arc Quad: Both;10 reps;Seated;AROM Hip Flexion/Marching: AROM;Both;10 reps;Seated Other Exercises Other Exercises: pt performed 5 forward trunk leans in sitting with left hand holding bed rail    General Comments        Pertinent Vitals/Pain Pain Assessment: Faces Faces Pain Scale: Hurts even more Pain Location: R arm, L knee Pain Descriptors / Indicators: Grimacing;Aching;Guarding Pain Intervention(s): Repositioned;Monitored during session    Home Living                      Prior Function            PT Goals (current goals can now be found in the care plan section) Progress towards PT goals: Progressing toward goals    Frequency    Min 2X/week      PT  Plan Current plan remains appropriate    Co-evaluation              AM-PAC PT "6 Clicks" Mobility   Outcome Measure  Help needed turning from your back to your side while in a flat bed without using bedrails?: Total Help needed moving from lying on your back to sitting on the side of a flat bed without using bedrails?: Total Help needed moving to and from a bed to a chair (including a wheelchair)?: Total Help needed standing up from a chair using your arms (e.g., wheelchair or bedside chair)?: Total Help needed to walk in hospital room?: Total Help needed climbing 3-5 steps with a railing? : Total 6 Click Score: 6    End of Session   Activity Tolerance: Patient tolerated treatment well Patient left: in bed;with call bell/phone within reach;with bed  alarm set Nurse Communication: Mobility status;Need for lift equipment PT Visit Diagnosis: Muscle weakness (generalized) (M62.81);Other abnormalities of gait and mobility (R26.89)     Time: 1010-1032 PT Time Calculation (min) (ACUTE ONLY): 22 min  Charges:  $Therapeutic Activity: 8-22 mins                     Jannette Spanner PT, DPT Acute Rehabilitation Services Pager: [phone] Office: [phone]   York Ram E 09/18/2020, 1:01 PM

## 2020-09-18 NOTE — TOC Progression Note (Signed)
Transition of Care [facility]) - Progression Note    Patient Details  Name: [name] MRN: [id number] Date of Birth: 09-Nov-1922  Transition of Care [facility]) CM/SW [place], [place] Phone Number: 09/18/2020, 1:36 PM  Clinical Narrative:   Rojelio Brenner from [facility] at [phone] called requesting updated clinicals for appeal process.  Sent to [phone]. TOC will continue to follow during the course of hospitalization.     Expected Discharge Plan: Skilled Nursing Facility Barriers to Discharge: SNF Pending bed offer  Expected Discharge Plan and Services Expected Discharge Plan: [place]   Discharge Planning Services: CM Consult Post Acute Care Choice: [place] Living arrangements for the past 2 months: Single Family Home                                       Social Determinants of Health (SDOH) Interventions    Readmission Risk Interventions No flowsheet data found.

## 2020-09-18 NOTE — Progress Notes (Signed)
Occupational Therapy Treatment Patient Details Name: [name] MRN: [id number] DOB: 17-Dec-1922 Today's Date: 09/18/2020    History of present illness [name] is a 87 y.o. female with medical history significant of atrial fibrillation, diastolic heart failure, primary hypertension, osteoarthritis, history of breast cancer.  Patient fell transferring to toilet and injuring right arm. Patient found to have right humeral fracture. Being treated conservatively. NWB with sling.   OT comments  Patient transferred to recliner via maxisky and then attempt to stand x 2 with sara. Max x 2 with stedy with patietn exhibiting significant difficulty with assisting with power up - despite use of left hand. Only able to lift bottom off of a recliner 1-2 inches with max x 2 assistance. Performed lower and upper extremity exercises. Patient limited by right shoulder pain. Will continue to work with patient to progress towards goals.   Follow Up Recommendations  SNF    Equipment Recommendations  None recommended by OT    Recommendations for Other Services      Precautions / Restrictions Precautions Precautions: Fall Precaution Comments: NWB RUE, left knee/shin pain Required Braces or Orthoses: Sling Restrictions Weight Bearing Restrictions: Yes RUE Weight Bearing: Non weight bearing RLE Weight Bearing: Weight bearing as tolerated LLE Weight Bearing: Weight bearing as tolerated       Mobility Bed Mobility Overal bed mobility: Needs Assistance Bed Mobility: Supine to Sit;Sit to Supine     Supine to sit: Total assist;+2 for physical assistance;+2 for safety/equipment;HOB elevated Sit to supine: Total assist;+2 for physical assistance;+2 for safety/equipment   General bed mobility comments: required total assist for upper and lower body movement and positioning to get to/from EOB    Transfers   Equipment used: 2 person hand held assist Transfers: Sit to/from Stand            General transfer comment: Maxisky lift to the recliner. once in recliner use of sara stedy to attempt sit to stand with max assist x 2. Patinent unable to assist with powering up using left arm only. Attempted twice with max x 2 with only the barest of lift of buttocks off of recliner.    Balance Overall balance assessment: Needs assistance Sitting-balance support: No upper extremity supported Sitting balance-Leahy Scale: Fair Sitting balance - Comments: reliant on UE support, held bed rail     Standing balance-Leahy Scale: Zero                             ADL either performed or assessed with clinical judgement   ADL     Eating/Feeding Details (indicate cue type and reason): enouraged self feeding. patinet having to use left non dominant hand so increased difficulty reported.                                         Vision Patient Visual Report: No change from baseline     Perception     Praxis      Cognition Arousal/Alertness: Awake/alert Behavior During Therapy: WFL for tasks assessed/performed Overall Cognitive Status: Within Functional Limits for tasks assessed                                          Exercises General Exercises -  Lower Extremity Long Arc Quad: Both;10 reps;Seated;AROM Hip Flexion/Marching: AROM;Both;10 reps;Seated Other Exercises Other Exercises: Fist pumps, wrist extension, knee extension each leg   Shoulder Instructions       General Comments      Pertinent Vitals/ Pain       Pain Assessment: Faces Faces Pain Scale: Hurts little more Pain Location: R arm Pain Descriptors / Indicators: Grimacing;Aching;Guarding Pain Intervention(s): Premedicated before session  Home Living                                          Prior Functioning/Environment              Frequency  Min 2X/week        Progress Toward Goals  OT Goals(current goals can now be found in the  care plan section)  Progress towards OT goals: Progressing toward goals  Acute Rehab OT Goals Patient Stated Goal: stand and transfer to toilet OT Goal Formulation: With patient Time For Goal Achievement: 09/27/20 Potential to Achieve Goals: [place] Discharge plan remains appropriate    Co-evaluation          OT goals addressed during session: Strengthening/ROM (functional mobility)      AM-PAC OT "6 Clicks" Daily Activity     Outcome Measure   Help from another person eating meals?: A Little Help from another person taking care of personal grooming?: A Little Help from another person toileting, which includes using toliet, bedpan, or urinal?: Total Help from another person bathing (including washing, rinsing, drying)?: Total Help from another person to put on and taking off regular upper body clothing?: Total Help from another person to put on and taking off regular lower body clothing?: Total 6 Click Score: 10    End of Session Equipment Utilized During Treatment: Other (comment) (hoyer, stedy)  OT Visit Diagnosis: Muscle weakness (generalized) (M62.81);Pain Pain - Right/Left: Right Pain - part of body: Shoulder   Activity Tolerance Patient limited by pain   Patient Left in chair;with call bell/phone within reach;with chair alarm set   Nurse Communication Mobility status;Need for lift equipment        Time: 9357-0177 OT Time Calculation (min): 18 min  Charges: OT General Charges $OT Visit: 1 Visit OT Treatments $Therapeutic Activity: 8-22 mins  Derl Barrow, OTR/L [place]  Office [phone] Pager: [place] 09/18/2020, 2:55 PM

## 2020-09-19 ENCOUNTER — Inpatient Hospital Stay (HOSPITAL_COMMUNITY): Payer: Medicare PPO

## 2020-09-19 DIAGNOSIS — R609 Edema, unspecified: Secondary | ICD-10-CM | POA: Diagnosis not present

## 2020-09-19 DIAGNOSIS — S42294A Other nondisplaced fracture of upper end of right humerus, initial encounter for closed fracture: Secondary | ICD-10-CM | POA: Diagnosis not present

## 2020-09-19 DIAGNOSIS — I5032 Chronic diastolic (congestive) heart failure: Secondary | ICD-10-CM | POA: Diagnosis not present

## 2020-09-19 DIAGNOSIS — I10 Essential (primary) hypertension: Secondary | ICD-10-CM | POA: Diagnosis not present

## 2020-09-19 DIAGNOSIS — I482 Chronic atrial fibrillation, unspecified: Secondary | ICD-10-CM | POA: Diagnosis not present

## 2020-09-19 LAB — BASIC METABOLIC PANEL
Anion gap: 12 (ref 5–15)
BUN: 39 mg/dL — ABNORMAL HIGH (ref 8–23)
CO2: 31 mmol/L (ref 22–32)
Calcium: 9 mg/dL (ref 8.9–10.3)
Chloride: 90 mmol/L — ABNORMAL LOW (ref 98–111)
Creatinine, Ser: 1.14 mg/dL — ABNORMAL HIGH (ref 0.44–1.00)
GFR, Estimated: 44 mL/min — ABNORMAL LOW (ref >60)
Glucose, Bld: 92 mg/dL (ref 70–99)
Potassium: 3.7 mmol/L (ref 3.5–5.1)
Sodium: 133 mmol/L — ABNORMAL LOW (ref 135–145)

## 2020-09-19 MED ORDER — POTASSIUM CHLORIDE 20 MEQ PO PACK
40.0000 meq | PACK | Freq: Once | ORAL | Status: DC
  Filled 2020-09-19: qty 2

## 2020-09-19 MED ORDER — ACETAMINOPHEN 325 MG PO TABS
650.0000 mg | ORAL_TABLET | Freq: Four times a day (QID) | ORAL | Status: DC | PRN
Start: 2020-09-19 — End: 2022-12-12

## 2020-09-19 MED ORDER — HYDROCODONE-ACETAMINOPHEN 5-325 MG PO TABS: 1.0000 | ORAL_TABLET | Freq: Four times a day (QID) | ORAL | 0 refills | Status: DC | PRN

## 2020-09-19 MED ORDER — DICLOFENAC SODIUM 1 % EX GEL: 2.0000 g | Freq: Four times a day (QID) | CUTANEOUS | 1 refills | Status: DC

## 2020-09-19 MED ORDER — COVID-19 MRNA VAC-TRIS(PFIZER) 30 MCG/0.3ML IM SUSP
0.3000 mL | Freq: Once | INTRAMUSCULAR | Status: AC
  Administered 2020-09-19: 0.3 mL via INTRAMUSCULAR
  Filled 2020-09-19: qty 0.3

## 2020-09-19 MED ORDER — POLYETHYLENE GLYCOL 3350 17 G PO PACK: 17.0000 g | PACK | Freq: Every day | ORAL | 0 refills | Status: AC

## 2020-09-19 MED ORDER — POTASSIUM CHLORIDE CRYS ER 10 MEQ PO TBCR
40.0000 meq | EXTENDED_RELEASE_TABLET | Freq: Once | ORAL | Status: AC
  Administered 2020-09-19: 40 meq via ORAL
  Filled 2020-09-19: qty 4

## 2020-09-19 NOTE — TOC Progression Note (Signed)
Transition of Care [facility]) - Progression Note    Patient Details  Name: [name] MRN: [id number] Date of Birth: Jan 08, 1923  Transition of Care [facility]) CM/SW [place], [place] Phone Number: 09/19/2020, 4:03 PM  Clinical Narrative:   Received call from insurance stating patient won appeal.  Craig Staggers #[id number]. Blaine Hamper #[id number]. 5 days 7/21-25.  Spoke with patient and Jan.  They agree that [facility] and [employer] are their first picks, and agreed that they are willing to take first opening.  I called Freda Munro at [employer], left message.  Texted Claiborne Billings at [employer].  Awaiting returns from both.  Will ask MD to order COVID test, booster shot. TOC will continue to follow during the course of hospitalization.     Expected Discharge Plan: Skilled Nursing Facility Barriers to Discharge: Other (must enter comment) (confirmation of bed availability)  Expected Discharge Plan and Services Expected Discharge Plan: [place]   Discharge Planning Services: CM Consult Post Acute Care Choice: [place] arrangements for the past 2 months: Single Family Home Expected Discharge Date: 09/19/20                                     Social Determinants of Health (SDOH) Interventions    Readmission Risk Interventions No flowsheet data found.

## 2020-09-19 NOTE — Discharge Summary (Addendum)
Physician Discharge Summary  [name] BSJ:[id number] DOB: [birth year] DOA: 09/12/2020  PCP: Aretta Nip, MD  Admit date: 09/12/2020  Discharge date: 09/24/2020  Admitted From: Home .  Disposition:   SNF  Recommendations for Outpatient Follow-up and new medication changes:  Follow up with Dr. Radene Ou in 7 to 10 days.  Continue pain control with acetaminophen and hydrocodone. Discontinue torsemide, if needed can resume furosemide for signs of volume overload.  For now on hold diuretics to prevent electrolyte disturbances or hypotension.    Home Health:  None Equipment/Devices:  None  Discharge Condition: stable  CODE STATUS: DNR   Diet recommendation:  heart healthy   Brief/Interim Summary: [name] was admitted to the hospital with the working diagnosis of right humeral fracture.    This 87 year old female with past medical history significant for atrial fibrillation, diastolic heart failure, hypertension, osteoarthritis and breast cancer who presented after a mechanical fall. When attempting to transfer from a walker to commode, she fell injuring her right arm, this prompted her to come to the hospital.  She uses a wheelchair for transportation.   Right humerus radiograph with mildly displaced and angulated oblique versus spiral fracture of proximal to mid right humeral diaphysis.  Osseous demineralization.  Advanced right glenohumeral degenerative changes. She was admitted to the medical ward for further pain control.  Orthopedic consultation recommended conservative management. Patient has been evaluated by physical therapy, recommended SNF that currently health insurance has been denied. She has appealed [employer], television/film/video of denial, she has won appeal.  Patient is being discharged to skilled nursing facility for rehab today.  She was managed for below problems.  Right humeral fracture, bilateral knee osteoarthritis.   Patient was admitted to the medical  ward.  Pain control was achieved with acetaminophen and hydrocodone. Topical diclofenac to her left knee.  Patient will continue physical therapy at the skilled nursing facility. Right upper extremity US negative for deep vein thrombosis.   Chronic atrial fibrillation/ bradycardia.   Her heart rate remains well controlled, she has AV nodal disease currently not on AV blockade. Not candidate for anticoagulation due to high fall risk.  Chronic diastolic heart failure.  Hypertension.   Echocardiography from 2015 showed a preserved LV systolic function, ejection fraction 60 to 65%. Records from cardiology office visit 08/2017 patient noncompliant with fluid restricted diet, decision to continue torsemide 40 g daily. Currently patient is euvolemic, high risk for dehydration and electrolyte normalities. Torsemide will be discontinued and instructions to resume as needed.  Resolved leukocytosis.  No signs of systemic infection.  Constipation.  Continue bowel regimen.   Discharge Diagnoses:  Principal Problem:   Humerus fracture Active Problems:   Essential hypertension   Atrial fibrillation (HCC)   Diastolic heart failure (HCC)   Primary osteoarthritis of left knee    Discharge Instructions  Discharge Instructions     Call MD for:  difficulty breathing, headache or visual disturbances   Complete by: As directed    Call MD for:  persistant dizziness or light-headedness   Complete by: As directed    Call MD for:  persistant nausea and vomiting   Complete by: As directed    Consult to Transition of Care Team   Complete by: As directed    Diet - low sodium heart healthy   Complete by: As directed    Diet - low sodium heart healthy   Complete by: As directed    Diet Carb Modified   Complete by: As directed  Discharge instructions   Complete by: As directed    Follow up with primary care and orthopedics as outpatient.   Discharge instructions   Complete by: As directed     Advised to follow-up with primary care physician in 1 week. Advised to follow-up with orthopedics as scheduled.   Increase activity slowly   Complete by: As directed    Increase activity slowly   Complete by: As directed       Allergies as of 09/24/2020       Reactions   Morphine And Related Other (See Comments)   Other Reaction: mild Intolerance, Allergy   Sulfa Antibiotics    Other reaction(s): Other (See Comments) Other Reaction: mild Intolerance, Allergy   Meperidine Nausea And Vomiting, Other (See Comments)   Meperidine Hcl Nausea Only   Nsaids    Other reaction(s): Other (See Comments) Bleeding ulcer   Tolmetin Other (See Comments)   Other reaction(s): Other (See Comments) Bleeding ulcer        Medication List     STOP taking these medications    HYDROcodone-acetaminophen 10-325 MG tablet Commonly known as: NORCO Replaced by: HYDROcodone-acetaminophen 5-325 MG tablet   Klor-Con M20 20 MEQ tablet Generic drug: potassium chloride SA   Magnesium 250 MG Tabs   torsemide 20 MG tablet Commonly known as: DEMADEX       TAKE these medications    acetaminophen 325 MG tablet Commonly known as: TYLENOL Take 2 tablets (650 mg total) by mouth every 6 (six) hours as needed for moderate pain. What changed:  medication strength how much to take reasons to take this   calcium-vitamin D 500-200 MG-UNIT tablet Commonly known as: OSCAL WITH D Take 1 tablet by mouth daily.   CENTRUM PO Take 1 tablet by mouth daily.   diclofenac Sodium 1 % Gel Commonly known as: VOLTAREN Apply 2 g topically 4 (four) times daily. Apply to left knee.   docusate sodium 100 MG capsule Commonly known as: COLACE Take 100 mg by mouth daily as needed for mild constipation.   FISH OIL PO Take 1 tablet by mouth daily.   HYDROcodone-acetaminophen 5-325 MG tablet Commonly known as: NORCO/VICODIN Take 1 tablet by mouth every 6 (six) hours as needed for severe pain. Replaces:  HYDROcodone-acetaminophen 10-325 MG tablet   omeprazole 20 MG capsule Commonly known as: PRILOSEC Take 20 mg by mouth every morning.   polyethylene glycol 17 g packet Commonly known as: MIRALAX / GLYCOLAX Take 17 g by mouth daily.   Posture Seat Misc Lift chair   Vitamin D 125 MCG (5000 UT) Caps Take 5,000 Units by mouth daily.        Follow-up Information     Rankins, Bill Salinas, MD. Schedule an appointment as soon as possible for a visit in 1 week(s).   Specialty: Family Medicine Contact information: [place] [address] [phone]         Josue Hector, MD .   Specialty: Cardiology Contact information: [phone] [address] [place] [address] [phone]         Altamese [place], MD. Schedule an appointment as soon as possible for a visit in 1 week(s).   Specialty: Orthopedic Surgery Contact information: [place] [address] [phone]                Allergies  Allergen Reactions   Morphine And Related Other (See Comments)    Other Reaction: mild Intolerance, Allergy  Sulfa Antibiotics     Other reaction(s): Other (See Comments) Other Reaction: mild Intolerance, Allergy   Meperidine Nausea And Vomiting and Other (See Comments)   Meperidine Hcl Nausea Only   Nsaids     Other reaction(s): Other (See Comments) Bleeding ulcer   Tolmetin Other (See Comments)    Other reaction(s): Other (See Comments) Bleeding ulcer    Consultations: Orthopedics    Procedures/Studies: DG Tibia/Fibula Left  Result Date: 09/12/2020 CLINICAL DATA:  Pain after fall. EXAM: LEFT TIBIA AND FIBULA - 2 VIEW COMPARISON:  May 25, 2015. FINDINGS: Diffuse demineralization of bone. Plate and screw fixation of a prior distal fibula fracture without evidence of loosening or perihardware fracture. Irregularity of the proximal fibula, similar to prior, sequela prior trauma. There is no evidence of acute fracture.  Vascular calcifications. IMPRESSION: 1. No acute osseous abnormality visualized. 2. Prior ORIF of distal fibula fracture without evidence of hardware complication. 3. Diffuse demineralization of bone. Electronically Signed   By: Dahlia Bailiff MD   On: 09/12/2020 15:17   DG Shoulder Right Portable  Result Date: 09/12/2020 CLINICAL DATA:  Pain post EXAM: PORTABLE RIGHT SHOULDER COMPARISON:  05/25/2015 FINDINGS: Osseous demineralization. AC joint alignment normal. Advanced RIGHT glenohumeral degenerative changes with joint space narrowing and spur formation. Displaced oblique versus spiral fracture of the proximal to mid RIGHT humeral diaphysis. No additional fracture, dislocation, or bone destruction. Visualized ribs intact. IMPRESSION: Mildly displaced spiral versus oblique fracture of the proximal to mid RIGHT humeral diaphysis. Osseous demineralization with advanced degenerative changes of RIGHT glenohumeral joint. Electronically Signed   By: Lavonia Dana M.D.   On: 09/12/2020 13:35   DG Knee Left Port  Result Date: 09/12/2020 CLINICAL DATA:  Fall with knee pain. EXAM: PORTABLE LEFT KNEE - 1-2 VIEW COMPARISON:  August 13, 2017 FINDINGS: No evidence of acute fracture or dislocation. Irregular appearance of the proximal fibula unchanged from prior, favored sequela prior trauma. Probable small joint effusion. Chondrocalcinosis with tricompartment degenerative change worse in the lateral and patellofemoral compartments. Vascular calcifications. IMPRESSION: 1. No acute fracture or dislocation visualized. 2. Chondrocalcinosis with stable tricompartment degenerative change most prominent in the lateral and patellofemoral compartments, suggestive of CPPD arthropathy. 3. Probable small joint effusion. Electronically Signed   By: Dahlia Bailiff MD   On: 09/12/2020 15:15   DG Humerus Right  Result Date: 09/12/2020 CLINICAL DATA:  [address] trying to transfer from wheelchair to toilet, RIGHT arm pain EXAM: RIGHT HUMERUS -  2+ VIEW COMPARISON:  None FINDINGS: Osseous demineralization. Oblique versus spiral fracture RIGHT proximal to mid humeral diaphysis, with apex medial angulation and lateral displacement. Shoulder and elbow joint alignments normal with advanced RIGHT glenohumeral degenerative changes seen. IMPRESSION: Mildly displaced and angulated oblique versus spiral fracture of proximal to mid RIGHT humeral diaphysis. Osseous demineralization. Advanced RIGHT glenohumeral degenerative changes. Electronically Signed   By: Lavonia Dana M.D.   On: 09/12/2020 13:45   DG Finger Index Left  Result Date: 09/12/2020 CLINICAL DATA:  Recent fall with second digit pain, initial encounter EXAM: LEFT INDEX FINGER 2+V COMPARISON:  None. FINDINGS: Degenerative changes of the interphalangeal joints are seen. Generalized soft tissue swelling is noted. No definitive fracture or dislocation is seen. IMPRESSION: Soft tissue swelling without acute bony abnormality. Electronically Signed   By: Inez Catalina M.D.   On: 09/12/2020 19:53   VAS [place] UPPER EXTREMITY VENOUS DUPLEX  Result Date: 09/19/2020 UPPER VENOUS STUDY  Patient Name:  AMANI MARSEILLE  Date of Exam:   09/19/2020  Medical Rec #: [id number]          Accession #:    [id number] Date of Birth: 09/13/1922          Patient Gender: F Patient Age:   89Y Exam Location:  [facility] Procedure:      VAS [place] UPPER EXTREMITY VENOUS DUPLEX Referring Phys: [id number] [place] --------------------------------------------------------------------------------  Indications: Edema Risk Factors: Trauma 09/12/2020 - DG Right Humerus - Mildly displaced and angulated oblique versus spiral fracture of. Limitations: Poor ultrasound/tissue interface, bandages, patient positioning, patient immobility, patient pain tolerance, musculoskeletal features and orthopaedic appliance. Comparison Study: No prior studies. Performing Technologist: Oliver Hum RVT  Examination Guidelines: A complete  evaluation includes B-mode imaging, spectral Doppler, color Doppler, and power Doppler as needed of all accessible portions of each vessel. Bilateral testing is considered an integral part of a complete examination. Limited examinations for reoccurring indications may be performed as noted.  Right Findings: +----------+------------+---------+-----------+----------+--------------+ RIGHT     CompressiblePhasicitySpontaneousProperties   Summary     +----------+------------+---------+-----------+----------+--------------+ IJV           Full       Yes       Yes                             +----------+------------+---------+-----------+----------+--------------+ Subclavian    Full       Yes       Yes                             +----------+------------+---------+-----------+----------+--------------+ Axillary                                            Not visualized +----------+------------+---------+-----------+----------+--------------+ Brachial                                            Not visualized +----------+------------+---------+-----------+----------+--------------+ Radial        Full                                                 +----------+------------+---------+-----------+----------+--------------+ Ulnar         Full                                                 +----------+------------+---------+-----------+----------+--------------+ Cephalic      Full                                                 +----------+------------+---------+-----------+----------+--------------+ Basilic                                             Not visualized +----------+------------+---------+-----------+----------+--------------+ Splint in place on  upper arm.  Left Findings: +----------+------------+---------+-----------+----------+-------+ LEFT      CompressiblePhasicitySpontaneousPropertiesSummary  +----------+------------+---------+-----------+----------+-------+ Subclavian    Full       Yes       Yes                      +----------+------------+---------+-----------+----------+-------+  Summary:  Right: Visualized veins appear patent and compressible.  Left: No evidence of thrombosis in the subclavian.  *See table(s) above for measurements and observations.  Diagnosing physician: Monica Martinez MD Electronically signed by Monica Martinez MD on 09/19/2020 at 2:31:37 PM.    Final        Subjective: Patient was seen and examined at bedside.  Overnight events noted.  Patient reports feeling better,  her right upper extremity edema is improving.  She continued to feel weak and deconditioned.  Patient is being discharged to skilled nursing facility today for rehab.   Discharge Exam: Vitals:   09/23/20 2000 09/24/20 0506  BP: 135/61 136/64  Pulse: (!) 52 64  Resp: 20 18  Temp: 98.4 F (36.9 C) 97.7 F (36.5 C)  SpO2: 95% 99%   Vitals:   09/23/20 0419 09/23/20 1831 09/23/20 2000 09/24/20 0506  BP: (!) 134/52 (!) 135/54 135/61 136/64  Pulse: 61 (!) 58 (!) 52 64  Resp: 20 20 20 18   Temp: (!) 97.5 F (36.4 C) 97.8 F (36.6 C) 98.4 F (36.9 C) 97.7 F (36.5 C)  TempSrc: Oral Oral Oral Oral  SpO2: 97% 97% 95% 99%  Weight:      Height:        General: deconditioned, not in pain.  Neurology: Awake and alert, non focal  E ENT: mils pallor, no icterus, oral mucosa moist Cardiovascular: No JVD. S1-S2 present, rhythmic, no gallops, rubs, or murmurs. No lower extremity edema. Pulmonary: positive breath sounds bilaterally, with no wheezing, rhonchi or rales. Gastrointestinal. Abdomen soft and non tender Skin. No rashes Musculoskeletal: Right arm swelling.  Tenderness in the right shoulder   The results of significant diagnostics from this hospitalization (including imaging, microbiology, ancillary and laboratory) are listed below for reference.      Microbiology: Recent Results (from the past 240 hour(s))  SARS CORONAVIRUS 2 (TAT 6-24 HRS) Nasopharyngeal Nasopharyngeal Swab     Status: None   Collection Time: 09/19/20  7:00 PM   Specimen: Nasopharyngeal Swab  Result Value Ref Range Status   SARS Coronavirus 2 NEGATIVE NEGATIVE Final    Comment: (NOTE) SARS-CoV-2 target nucleic acids are NOT DETECTED.  The SARS-CoV-2 RNA is generally detectable in upper and lower respiratory specimens during the acute phase of infection. Negative results do not preclude SARS-CoV-2 infection, do not rule out co-infections with other pathogens, and should not be used as the sole basis for treatment or other patient management decisions. Negative results must be combined with clinical observations, patient history, and epidemiological information. The expected result is Negative.  Fact Sheet for Patients: SugarRoll.be  Fact Sheet for Healthcare Providers: https://www.woods-mathews.com/  This test is not yet approved or cleared by the [place] FDA and  has been authorized for detection and/or diagnosis of SARS-CoV-2 by FDA under an Emergency Use Authorization (EUA). This EUA will remain  in effect (meaning this test can be used) for the duration of the COVID-19 declaration under Se ction 564(b)(1) of the Act, 21 U.S.C. section 360bbb-3(b)(1), unless the authorization is terminated or revoked sooner.  Performed at [facility], [place] [address] [place], [place] [address]  Resp Panel by RT-PCR (Flu A&B, Covid) Nasopharyngeal Swab     Status: None   Collection Time: 09/22/20  4:14 PM   Specimen: Nasopharyngeal Swab; Nasopharyngeal(NP) swabs in vial transport medium  Result Value Ref Range Status   SARS Coronavirus 2 by RT PCR NEGATIVE NEGATIVE Final    Comment: (NOTE) SARS-CoV-2 target nucleic acids are NOT DETECTED.  The SARS-CoV-2 RNA is generally detectable in upper  respiratory specimens during the acute phase of infection. The lowest concentration of SARS-CoV-2 viral copies this assay can detect is 138 copies/mL. A negative result does not preclude SARS-Cov-2 infection and should not be used as the sole basis for treatment or other patient management decisions. A negative result may occur with  improper specimen collection/handling, submission of specimen other than nasopharyngeal swab, presence of viral mutation(s) within the areas targeted by this assay, and inadequate number of viral copies(<138 copies/mL). A negative result must be combined with clinical observations, patient history, and epidemiological information. The expected result is Negative.  Fact Sheet for Patients:  EntrepreneurPulse.com.au  Fact Sheet for Healthcare Providers:  IncredibleEmployment.be  This test is no t yet approved or cleared by the [place] FDA and  has been authorized for detection and/or diagnosis of SARS-CoV-2 by FDA under an Emergency Use Authorization (EUA). This EUA will remain  in effect (meaning this test can be used) for the duration of the COVID-19 declaration under Section 564(b)(1) of the Act, 21 U.S.C.section 360bbb-3(b)(1), unless the authorization is terminated  or revoked sooner.       Influenza A by PCR NEGATIVE NEGATIVE Final   Influenza B by PCR NEGATIVE NEGATIVE Final    Comment: (NOTE) The Xpert Xpress SARS-CoV-2/FLU/RSV plus assay is intended as an aid in the diagnosis of influenza from Nasopharyngeal swab specimens and should not be used as a sole basis for treatment. Nasal washings and aspirates are unacceptable for Xpert Xpress SARS-CoV-2/FLU/RSV testing.  Fact Sheet for Patients: EntrepreneurPulse.com.au  Fact Sheet for Healthcare Providers: IncredibleEmployment.be  This test is not yet approved or cleared by the [place] FDA and has been  authorized for detection and/or diagnosis of SARS-CoV-2 by FDA under an Emergency Use Authorization (EUA). This EUA will remain in effect (meaning this test can be used) for the duration of the COVID-19 declaration under Section 564(b)(1) of the Act, 21 U.S.C. section 360bbb-3(b)(1), unless the authorization is terminated or revoked.  Performed at [facility], [place] [address] [place], [place] [address]      Labs: BNP (last 3 results) No results for input(s): BNP in the last 8760 hours. Basic Metabolic Panel: Recent Labs  Lab 09/19/20 0532 09/21/20 0453  NA 133* 139  K 3.7 3.6  CL 90* 101  CO2 31 28  GLUCOSE 92 89  BUN 39* 39*  CREATININE 1.14* 1.01*  CALCIUM 9.0 9.0  MG  --  2.5*  PHOS  --  3.1   Liver Function Tests: No results for input(s): AST, ALT, ALKPHOS, BILITOT, PROT, ALBUMIN in the last 168 hours. No results for input(s): LIPASE, AMYLASE in the last 168 hours. No results for input(s): AMMONIA in the last 168 hours. CBC: Recent Labs  Lab 09/21/20 0453  WBC 7.5  HGB 10.7*  HCT 33.1*  MCV 95.4  PLT 328   Cardiac Enzymes: No results for input(s): CKTOTAL, CKMB, CKMBINDEX, TROPONINI in the last 168 hours. BNP: Invalid input(s): POCBNP CBG: No results for input(s): GLUCAP in the last 168 hours. D-Dimer No results for input(s):  DDIMER in the last 72 hours. Hgb A1c No results for input(s): HGBA1C in the last 72 hours. Lipid Profile No results for input(s): CHOL, HDL, LDLCALC, TRIG, CHOLHDL, LDLDIRECT in the last 72 hours. Thyroid function studies No results for input(s): TSH, T4TOTAL, T3FREE, THYROIDAB in the last 72 hours.  Invalid input(s): FREET3 Anemia work up No results for input(s): VITAMINB12, FOLATE, FERRITIN, TIBC, IRON, RETICCTPCT in the last 72 hours. Urinalysis    Component Value Date/Time   COLORURINE YELLOW 11/11/2018 1009   APPEARANCEUR CLEAR 11/11/2018 1009   LABSPEC 1.008 11/11/2018 1009   PHURINE 6.0  11/11/2018 1009   GLUCOSEU NEGATIVE 11/11/2018 1009   HGBUR SMALL (A) 11/11/2018 1009   BILIRUBINUR NEGATIVE 11/11/2018 1009   KETONESUR NEGATIVE 11/11/2018 1009   PROTEINUR NEGATIVE 11/11/2018 1009   UROBILINOGEN 0.2 05/02/2012 0050   NITRITE NEGATIVE 11/11/2018 1009   LEUKOCYTESUR NEGATIVE 11/11/2018 1009   Sepsis Labs Invalid input(s): PROCALCITONIN,  WBC,  LACTICIDVEN Microbiology Recent Results (from the past 240 hour(s))  SARS CORONAVIRUS 2 (TAT 6-24 HRS) Nasopharyngeal Nasopharyngeal Swab     Status: None   Collection Time: 09/19/20  7:00 PM   Specimen: Nasopharyngeal Swab  Result Value Ref Range Status   SARS Coronavirus 2 NEGATIVE NEGATIVE Final    Comment: (NOTE) SARS-CoV-2 target nucleic acids are NOT DETECTED.  The SARS-CoV-2 RNA is generally detectable in upper and lower respiratory specimens during the acute phase of infection. Negative results do not preclude SARS-CoV-2 infection, do not rule out co-infections with other pathogens, and should not be used as the sole basis for treatment or other patient management decisions. Negative results must be combined with clinical observations, patient history, and epidemiological information. The expected result is Negative.  Fact Sheet for Patients: SugarRoll.be  Fact Sheet for Healthcare Providers: https://www.woods-mathews.com/  This test is not yet approved or cleared by the [place] FDA and  has been authorized for detection and/or diagnosis of SARS-CoV-2 by FDA under an Emergency Use Authorization (EUA). This EUA will remain  in effect (meaning this test can be used) for the duration of the COVID-19 declaration under Se ction 564(b)(1) of the Act, 21 U.S.C. section 360bbb-3(b)(1), unless the authorization is terminated or revoked sooner.  Performed at [facility], [place] [address], [place], [place] [address]   Resp Panel by RT-PCR (Flu A&B, Covid)  Nasopharyngeal Swab     Status: None   Collection Time: 09/22/20  4:14 PM   Specimen: Nasopharyngeal Swab; Nasopharyngeal(NP) swabs in vial transport medium  Result Value Ref Range Status   SARS Coronavirus 2 by RT PCR NEGATIVE NEGATIVE Final    Comment: (NOTE) SARS-CoV-2 target nucleic acids are NOT DETECTED.  The SARS-CoV-2 RNA is generally detectable in upper respiratory specimens during the acute phase of infection. The lowest concentration of SARS-CoV-2 viral copies this assay can detect is 138 copies/mL. A negative result does not preclude SARS-Cov-2 infection and should not be used as the sole basis for treatment or other patient management decisions. A negative result may occur with  improper specimen collection/handling, submission of specimen other than nasopharyngeal swab, presence of viral mutation(s) within the areas targeted by this assay, and inadequate number of viral copies(<138 copies/mL). A negative result must be combined with clinical observations, patient history, and epidemiological information. The expected result is Negative.  Fact Sheet for Patients:  EntrepreneurPulse.com.au  Fact Sheet for Healthcare Providers:  IncredibleEmployment.be  This test is no t yet approved or cleared by the [place] FDA  and  has been authorized for detection and/or diagnosis of SARS-CoV-2 by FDA under an Emergency Use Authorization (EUA). This EUA will remain  in effect (meaning this test can be used) for the duration of the COVID-19 declaration under Section 564(b)(1) of the Act, 21 U.S.C.section 360bbb-3(b)(1), unless the authorization is terminated  or revoked sooner.       Influenza A by PCR NEGATIVE NEGATIVE Final   Influenza B by PCR NEGATIVE NEGATIVE Final    Comment: (NOTE) The Xpert Xpress SARS-CoV-2/FLU/RSV plus assay is intended as an aid in the diagnosis of influenza from Nasopharyngeal swab specimens and should not be  used as a sole basis for treatment. Nasal washings and aspirates are unacceptable for Xpert Xpress SARS-CoV-2/FLU/RSV testing.  Fact Sheet for Patients: EntrepreneurPulse.com.au  Fact Sheet for Healthcare Providers: IncredibleEmployment.be  This test is not yet approved or cleared by the [place] FDA and has been authorized for detection and/or diagnosis of SARS-CoV-2 by FDA under an Emergency Use Authorization (EUA). This EUA will remain in effect (meaning this test can be used) for the duration of the COVID-19 declaration under Section 564(b)(1) of the Act, 21 U.S.C. section 360bbb-3(b)(1), unless the authorization is terminated or revoked.  Performed at [facility], [place] [address], [place], [place] [address]      Time coordinating discharge: 45 minutes  SIGNED:   Shawna Clamp, MD  [facility] 09/24/2020, 11:46 AM

## 2020-09-19 NOTE — Progress Notes (Signed)
Right upper extremity venous duplex has been completed. Preliminary results can be found in CV Proc through chart review.  Results were given to Dr. Cathlean Sauer.  09/19/20 12:04 PM [name] RVT

## 2020-09-20 DIAGNOSIS — S42294A Other nondisplaced fracture of upper end of right humerus, initial encounter for closed fracture: Secondary | ICD-10-CM | POA: Diagnosis not present

## 2020-09-20 LAB — SARS CORONAVIRUS 2 (TAT 6-24 HRS): SARS Coronavirus 2: NEGATIVE

## 2020-09-20 NOTE — TOC Progression Note (Signed)
Transition of Care [facility]) - Progression Note    Patient Details  Name: ARLINDA COTTOM MRN: [id number] Date of Birth: [birth year]  Transition of Care [facility]) CM/SW [place], [place] Phone Number: 09/20/2020, 2:34 PM  Clinical Narrative:   [facility] nor [facility] has an opening today nor this weekend.  Will try again on Monday. They know we are holding.  Updated Cloyde Reams with this information. She again confirmed they are fine with either place and are willing to take first available. TOC will continue to follow during the course of hospitalization.     Expected Discharge Plan: Skilled Nursing Facility Barriers to Discharge: Other (must enter comment) (confirmation of bed availability)  Expected Discharge Plan and Services Expected Discharge Plan: [place]   Discharge Planning Services: CM Consult Post Acute Care Choice: [place] arrangements for the past 2 months: Single Family Home Expected Discharge Date: 09/19/20                                     Social Determinants of Health (SDOH) Interventions    Readmission Risk Interventions No flowsheet data found.

## 2020-09-20 NOTE — Progress Notes (Signed)
PROGRESS NOTE    NITHILA BLOSSER  [id number] DOB: [birth year] DOA: 09/12/2020  PCP: Aretta Nip, MD   Brief Narrative:  This 87 yrs old female with PMH significant for atrial fibrillation, diastolic heart failure, hypertension, osteoarthritis, breast cancer presented s/p mechanical fall when attempting to transfer from a walker to a commode she fell injuring her right arm this prompted her to come to the hospital.  She uses a wheelchair for transportation.  X-ray right shoulder shows right humeral mildly displaced and angulated oblique versus spiral fracture.  Orthopedics was consulted recommended conservative management.  Patient has been evaluated by physical therapy recommended SNF.  Assessment & Plan:   Principal Problem:   Humerus fracture Active Problems:   Essential hypertension   Atrial fibrillation (HCC)   Diastolic heart failure (HCC)   Primary osteoarthritis of left knee  Right humeral fracture: Patient presented status post mechanical fall. X-ray right shoulder shows right humeral mildly displaced and angulated oblique versus spiral fracture. Orthopedic consulted,  recommended conservative management. Adequate pain control with Tylenol and hydrocodone.  Continue topical diclofenac. Physical therapy recommended skilled nursing facility for rehab. Right upper extremity ultrasound negative for DVT.  Chronic atrial fibrillation: Heart rate remains rate controlled.   She has AV nodal disease currently not on any AV blocking medications. Patient is not a candidate for anticoagulation due to high fall risk.  Chronic diastolic heart failure.: Echocardiogram from 2015 showed preserved LV systolic function ejection fraction 60- 65%. Patient has been noncompliant with fluid restricted diet.   Patient appears euvolemic, will resume torsemide at discharge.  Leukocytosis: no signs of systemic infection,  resolved.  Constipation:  continue bowel regimen.  DVT  prophylaxis: Lovenox Code Status: DNR Family Communication: No family at bed side. Disposition Plan:   Status is: Inpatient  Remains inpatient appropriate because:Inpatient level of care appropriate due to severity of illness  Dispo: The patient is from: Home              Anticipated d/c is to: SNF              Patient currently is medically stable to d/c.   Difficult to place patient No   Consultants:  Orthopaedics.  Procedures:  Antimicrobials:   Anti-infectives (From admission, onward)    None       Subjective: Patient was seen and examined at bedside.  Overnight events noted.  Patient reports feeling better except right shoulder pain because of humeral fracture.  Objective: Vitals:   09/19/20 1439 09/19/20 2038 09/20/20 0457 09/20/20 1300  BP: (!) 122/57 111/63 132/73 (!) 121/57  Pulse: (!) 57 61 (!) 56 (!) 53  Resp: '18 18 20 20  '$ Temp: (!) 97.5 F (36.4 C) 98.1 F (36.7 C) 98.2 F (36.8 C) 97.6 F (36.4 C)  TempSrc: Oral Oral  Oral  SpO2: 99% 95% 99% 97%  Weight:      Height:        Intake/Output Summary (Last 24 hours) at 09/20/2020 1323 Last data filed at 09/20/2020 0920 Gross per 24 hour  Intake 100 ml  Output 1100 ml  Net -1000 ml   Filed Weights   09/12/20 1127  Weight: 104.3 kg    Examination:  General exam: Appears calm and comfortable , conversant.  Not in acute distress Respiratory system: Clear to auscultation. Respiratory effort normal. Cardiovascular system: S1 & S2 heard, RRR. No JVD, murmurs, rubs, gallops or clicks. No pedal edema. Gastrointestinal system: Abdomen is nondistended, soft and nontender.  No organomegaly or masses felt. Normal bowel sounds heard. Central nervous system: Alert and oriented. No focal neurological deficits. Extremities: Symmetric 5 x 5 power.  Right arm swelling because of right shoulder fracture Skin: No rashes, lesions or ulcers Psychiatry: Judgement and insight appear normal. Mood & affect appropriate.      Data Reviewed: I have personally reviewed following labs and imaging studies  CBC: No results for input(s): WBC, NEUTROABS, HGB, HCT, MCV, PLT in the last 168 hours. Basic Metabolic Panel: Recent Labs  Lab 09/19/20 0532  NA 133*  K 3.7  CL 90*  CO2 31  GLUCOSE 92  BUN 39*  CREATININE 1.14*  CALCIUM 9.0   GFR: Estimated Creatinine Clearance: 36.7 mL/min (A) (by C-G formula based on SCr of 1.14 mg/dL (H)). Liver Function Tests: No results for input(s): AST, ALT, ALKPHOS, BILITOT, PROT, ALBUMIN in the last 168 hours. No results for input(s): LIPASE, AMYLASE in the last 168 hours. No results for input(s): AMMONIA in the last 168 hours. Coagulation Profile: No results for input(s): INR, PROTIME in the last 168 hours. Cardiac Enzymes: No results for input(s): CKTOTAL, CKMB, CKMBINDEX, TROPONINI in the last 168 hours. BNP (last 3 results) No results for input(s): PROBNP in the last 8760 hours. HbA1C: No results for input(s): HGBA1C in the last 72 hours. CBG: No results for input(s): GLUCAP in the last 168 hours. Lipid Profile: No results for input(s): CHOL, HDL, LDLCALC, TRIG, CHOLHDL, LDLDIRECT in the last 72 hours. Thyroid Function Tests: No results for input(s): TSH, T4TOTAL, FREET4, T3FREE, THYROIDAB in the last 72 hours. Anemia Panel: No results for input(s): VITAMINB12, FOLATE, FERRITIN, TIBC, IRON, RETICCTPCT in the last 72 hours. Sepsis Labs: No results for input(s): PROCALCITON, LATICACIDVEN in the last 168 hours.  Recent Results (from the past 240 hour(s))  SARS CORONAVIRUS 2 (TAT 6-24 HRS) Nasopharyngeal Nasopharyngeal Swab     Status: None   Collection Time: 09/12/20  4:14 PM   Specimen: Nasopharyngeal Swab  Result Value Ref Range Status   SARS Coronavirus 2 NEGATIVE NEGATIVE Final    Comment: (NOTE) SARS-CoV-2 target nucleic acids are NOT DETECTED.  The SARS-CoV-2 RNA is generally detectable in upper and lower respiratory specimens during the acute  phase of infection. Negative results do not preclude SARS-CoV-2 infection, do not rule out co-infections with other pathogens, and should not be used as the sole basis for treatment or other patient management decisions. Negative results must be combined with clinical observations, patient history, and epidemiological information. The expected result is Negative.  Fact Sheet for Patients: SugarRoll.be  Fact Sheet for Healthcare Providers: https://www.woods-mathews.com/  This test is not yet approved or cleared by the [place] FDA and  has been authorized for detection and/or diagnosis of SARS-CoV-2 by FDA under an Emergency Use Authorization (EUA). This EUA will remain  in effect (meaning this test can be used) for the duration of the COVID-19 declaration under Se ction 564(b)(1) of the Act, 21 U.S.C. section 360bbb-3(b)(1), unless the authorization is terminated or revoked sooner.  Performed at [facility], [place] [address], [place], [place] [address]   SARS CORONAVIRUS 2 (TAT 6-24 HRS) Nasopharyngeal Nasopharyngeal Swab     Status: None   Collection Time: 09/19/20  7:00 PM   Specimen: Nasopharyngeal Swab  Result Value Ref Range Status   SARS Coronavirus 2 NEGATIVE NEGATIVE Final    Comment: (NOTE) SARS-CoV-2 target nucleic acids are NOT DETECTED.  The SARS-CoV-2 RNA is generally detectable in upper and lower  respiratory specimens during the acute phase of infection. Negative results do not preclude SARS-CoV-2 infection, do not rule out co-infections with other pathogens, and should not be used as the sole basis for treatment or other patient management decisions. Negative results must be combined with clinical observations, patient history, and epidemiological information. The expected result is Negative.  Fact Sheet for Patients: SugarRoll.be  Fact Sheet for Healthcare  Providers: https://www.woods-mathews.com/  This test is not yet approved or cleared by the [place] FDA and  has been authorized for detection and/or diagnosis of SARS-CoV-2 by FDA under an Emergency Use Authorization (EUA). This EUA will remain  in effect (meaning this test can be used) for the duration of the COVID-19 declaration under Se ction 564(b)(1) of the Act, 21 U.S.C. section 360bbb-3(b)(1), unless the authorization is terminated or revoked sooner.  Performed at [facility], [place] [address] [place], [place] [address]      Radiology Studies: VAS [place] UPPER EXTREMITY VENOUS DUPLEX  Result Date: 09/19/2020 UPPER VENOUS STUDY  Patient Name:  SAYWARD FOUTZ  Date of Exam:   09/19/2020 Medical Rec #: [id number]          Accession #:    [id number] Date of Birth: [birth year]          Patient Gender: F Patient Age:   50Y Exam Location:  [facility] Procedure:      VAS [place] UPPER EXTREMITY VENOUS DUPLEX Referring Phys: [id number] [place] --------------------------------------------------------------------------------  Indications: Edema Risk Factors: Trauma 09/12/2020 - DG Right Humerus - Mildly displaced and angulated oblique versus spiral fracture of. Limitations: Poor ultrasound/tissue interface, bandages, patient positioning, patient immobility, patient pain tolerance, musculoskeletal features and orthopaedic appliance. Comparison Study: No prior studies. Performing Technologist: Oliver Hum RVT  Examination Guidelines: A complete evaluation includes B-mode imaging, spectral Doppler, color Doppler, and power Doppler as needed of all accessible portions of each vessel. Bilateral testing is considered an integral part of a complete examination. Limited examinations for reoccurring indications may be performed as noted.  Right Findings: +----------+------------+---------+-----------+----------+--------------+ RIGHT      CompressiblePhasicitySpontaneousProperties   Summary     +----------+------------+---------+-----------+----------+--------------+ IJV           Full       Yes       Yes                             +----------+------------+---------+-----------+----------+--------------+ Subclavian    Full       Yes       Yes                             +----------+------------+---------+-----------+----------+--------------+ Axillary                                            Not visualized +----------+------------+---------+-----------+----------+--------------+ Brachial                                            Not visualized +----------+------------+---------+-----------+----------+--------------+ Radial        Full                                                 +----------+------------+---------+-----------+----------+--------------+  Ulnar         Full                                                 +----------+------------+---------+-----------+----------+--------------+ Cephalic      Full                                                 +----------+------------+---------+-----------+----------+--------------+ Basilic                                             Not visualized +----------+------------+---------+-----------+----------+--------------+ Splint in place on upper arm.  Left Findings: +----------+------------+---------+-----------+----------+-------+ LEFT      CompressiblePhasicitySpontaneousPropertiesSummary +----------+------------+---------+-----------+----------+-------+ Subclavian    Full       Yes       Yes                      +----------+------------+---------+-----------+----------+-------+  Summary:  Right: Visualized veins appear patent and compressible.  Left: No evidence of thrombosis in the subclavian.  *See table(s) above for measurements and observations.  Diagnosing physician: Monica Martinez MD Electronically signed by  Monica Martinez MD on 09/19/2020 at 2:31:37 PM.    Final      Scheduled Meds:  acetaminophen  650 mg Oral Q6H   diclofenac Sodium  2 g Topical QID   enoxaparin (LOVENOX) injection  40 mg Subcutaneous Q24H   pantoprazole  40 mg Oral Daily   polyethylene glycol  17 g Oral BID   senna-docusate  1 tablet Oral BID   Continuous Infusions:   LOS: 3 days    Time spent: 25 mins    Shawna Clamp, MD [facility]   If 7PM-7AM, please contact night-coverage

## 2020-09-20 NOTE — Care Management Important Message (Signed)
Important Message  Patient Details IM Letter placed in Patients room. Name: [name] MRN: [id number] Date of Birth: 10-26-1922   Medicare Important Message Given:  Yes     Kerin Salen 09/20/2020, 11:54 AM

## 2020-09-21 DIAGNOSIS — S42294A Other nondisplaced fracture of upper end of right humerus, initial encounter for closed fracture: Secondary | ICD-10-CM | POA: Diagnosis not present

## 2020-09-21 LAB — CBC
HCT: 33.1 % — ABNORMAL LOW (ref 36.0–46.0)
Hemoglobin: 10.7 g/dL — ABNORMAL LOW (ref 12.0–15.0)
MCH: 30.8 pg (ref 26.0–34.0)
MCHC: 32.3 g/dL (ref 30.0–36.0)
MCV: 95.4 fL (ref 80.0–100.0)
Platelets: 328 10*3/uL (ref 150–400)
RBC: 3.47 MIL/uL — ABNORMAL LOW (ref 3.87–5.11)
RDW: 14.2 % (ref 11.5–15.5)
WBC: 7.5 10*3/uL (ref 4.0–10.5)
nRBC: 0 % (ref 0.0–0.2)

## 2020-09-21 LAB — BASIC METABOLIC PANEL
Anion gap: 10 (ref 5–15)
BUN: 39 mg/dL — ABNORMAL HIGH (ref 8–23)
CO2: 28 mmol/L (ref 22–32)
Calcium: 9 mg/dL (ref 8.9–10.3)
Chloride: 101 mmol/L (ref 98–111)
Creatinine, Ser: 1.01 mg/dL — ABNORMAL HIGH (ref 0.44–1.00)
GFR, Estimated: 51 mL/min — ABNORMAL LOW (ref >60)
Glucose, Bld: 89 mg/dL (ref 70–99)
Potassium: 3.6 mmol/L (ref 3.5–5.1)
Sodium: 139 mmol/L (ref 135–145)

## 2020-09-21 LAB — PHOSPHORUS: Phosphorus: 3.1 mg/dL (ref 2.5–4.6)

## 2020-09-21 LAB — MAGNESIUM: Magnesium: 2.5 mg/dL — ABNORMAL HIGH (ref 1.7–2.4)

## 2020-09-21 NOTE — Progress Notes (Signed)
PROGRESS NOTE    [name]  [id number] DOB: 10-13-1922 DOA: 09/12/2020  PCP: Aretta Nip, MD   Brief Narrative:  This 87 yrs old female with PMH significant for atrial fibrillation, diastolic heart failure, hypertension, osteoarthritis, breast cancer presented s/p mechanical fall when attempting to transfer from a walker to a commode she fell injuring her right arm this prompted her to come to the hospital.  She uses a wheelchair for transportation.  X-ray right shoulder shows right humeral mildly displaced and angulated oblique versus spiral fracture.  Orthopedics was consulted recommended conservative management.  Patient has been evaluated by physical therapy recommended SNF.  Assessment & Plan:   Principal Problem:   Humerus fracture Active Problems:   Essential hypertension   Atrial fibrillation (HCC)   Diastolic heart failure (HCC)   Primary osteoarthritis of left knee  Right humeral fracture: Patient presented status post mechanical fall. X-ray right shoulder shows right humeral mildly displaced and angulated oblique versus spiral fracture. Orthopedic consulted,  recommended conservative management. Adequate pain control with Tylenol and hydrocodone.  Continue topical diclofenac. Physical therapy recommended skilled nursing facility for rehab. Right upper extremity ultrasound negative for DVT.  Chronic atrial fibrillation: Heart rate remains rate controlled.   She has AV nodal disease currently not on any AV blocking medications. Patient is not a candidate for anticoagulation due to high fall risk.  Chronic diastolic heart failure.: Echocardiogram from 2015 showed preserved LV systolic function ejection fraction 60- 65%. Patient has been noncompliant with fluid restricted diet.   Patient appears euvolemic, will resume torsemide at discharge.  Leukocytosis: no signs of systemic infection,  resolved.  Constipation:  continue bowel regimen.  DVT  prophylaxis: Lovenox Code Status: DNR Family Communication: No family at bed side. Disposition Plan:   Status is: Inpatient  Remains inpatient appropriate because:Inpatient level of care appropriate due to severity of illness  Dispo: The patient is from: Home              Anticipated d/c is to: SNF              Patient currently is medically stable to d/c.   Difficult to place patient No   Consultants:  Orthopaedics.  Procedures:  Antimicrobials:   Anti-infectives (From admission, onward)    None       Subjective: Patient was seen and examined at bedside.  Overnight events noted.   Patient was seen brushing her teeth in the morning.  She reports the pain in the right shoulder is manageable.  Objective: Vitals:   09/20/20 0457 09/20/20 1300 09/20/20 2023 09/21/20 0439  BP: 132/73 (!) 121/57 (!) 144/57 132/60  Pulse: (!) 56 (!) 53 (!) 54 (!) 53  Resp: '20 20 20 18  '$ Temp: 98.2 F (36.8 C) 97.6 F (36.4 C) 98.3 F (36.8 C) (!) 97.4 F (36.3 C)  TempSrc:  Oral Oral Oral  SpO2: 99% 97% 96% 97%  Weight:      Height:        Intake/Output Summary (Last 24 hours) at 09/21/2020 1310 Last data filed at 09/21/2020 1138 Gross per 24 hour  Intake 480 ml  Output 1000 ml  Net -520 ml   Filed Weights   09/12/20 1127  Weight: 104.3 kg    Examination:  General exam: Appears calm and comfortable , conversant.  Not in acute distress Respiratory system: Clear to auscultation. Respiratory effort normal. Cardiovascular system: S1 & S2 heard, RRR. No JVD, murmurs, rubs, gallops or clicks. No  pedal edema. Gastrointestinal system: Abdomen is nondistended, soft and nontender. No organomegaly or masses felt. Normal bowel sounds heard. Central nervous system: Alert and oriented. No focal neurological deficits. Extremities:  Right arm swelling because of right shoulder fracture Skin: No rashes, lesions or ulcers Psychiatry: Judgement and insight appear normal. Mood & affect  appropriate.     Data Reviewed: I have personally reviewed following labs and imaging studies  CBC: Recent Labs  Lab 09/21/20 0453  WBC 7.5  HGB 10.7*  HCT 33.1*  MCV 95.4  PLT [id number]   Basic Metabolic Panel: Recent Labs  Lab 09/19/20 0532 09/21/20 0453  NA 133* 139  K 3.7 3.6  CL 90* 101  CO2 31 28  GLUCOSE 92 89  BUN 39* 39*  CREATININE 1.14* 1.01*  CALCIUM 9.0 9.0  MG  --  2.5*  PHOS  --  3.1   GFR: Estimated Creatinine Clearance: 41.4 mL/min (A) (by C-G formula based on SCr of 1.01 mg/dL (H)). Liver Function Tests: No results for input(s): AST, ALT, ALKPHOS, BILITOT, PROT, ALBUMIN in the last 168 hours. No results for input(s): LIPASE, AMYLASE in the last 168 hours. No results for input(s): AMMONIA in the last 168 hours. Coagulation Profile: No results for input(s): INR, PROTIME in the last 168 hours. Cardiac Enzymes: No results for input(s): CKTOTAL, CKMB, CKMBINDEX, TROPONINI in the last 168 hours. BNP (last 3 results) No results for input(s): PROBNP in the last 8760 hours. HbA1C: No results for input(s): HGBA1C in the last 72 hours. CBG: No results for input(s): GLUCAP in the last 168 hours. Lipid Profile: No results for input(s): CHOL, HDL, LDLCALC, TRIG, CHOLHDL, LDLDIRECT in the last 72 hours. Thyroid Function Tests: No results for input(s): TSH, T4TOTAL, FREET4, T3FREE, THYROIDAB in the last 72 hours. Anemia Panel: No results for input(s): VITAMINB12, FOLATE, FERRITIN, TIBC, IRON, RETICCTPCT in the last 72 hours. Sepsis Labs: No results for input(s): PROCALCITON, LATICACIDVEN in the last 168 hours.  Recent Results (from the past 240 hour(s))  SARS CORONAVIRUS 2 (TAT 6-24 HRS) Nasopharyngeal Nasopharyngeal Swab     Status: None   Collection Time: 09/12/20  4:14 PM   Specimen: Nasopharyngeal Swab  Result Value Ref Range Status   SARS Coronavirus 2 NEGATIVE NEGATIVE Final    Comment: (NOTE) SARS-CoV-2 target nucleic acids are NOT DETECTED.  The  SARS-CoV-2 RNA is generally detectable in upper and lower respiratory specimens during the acute phase of infection. Negative results do not preclude SARS-CoV-2 infection, do not rule out co-infections with other pathogens, and should not be used as the sole basis for treatment or other patient management decisions. Negative results must be combined with clinical observations, patient history, and epidemiological information. The expected result is Negative.  Fact Sheet for Patients: SugarRoll.be  Fact Sheet for Healthcare Providers: https://www.woods-mathews.com/  This test is not yet approved or cleared by the [place] FDA and  has been authorized for detection and/or diagnosis of SARS-CoV-2 by FDA under an Emergency Use Authorization (EUA). This EUA will remain  in effect (meaning this test can be used) for the duration of the COVID-19 declaration under Se ction 564(b)(1) of the Act, 21 U.S.C. section 360bbb-3(b)(1), unless the authorization is terminated or revoked sooner.  Performed at [facility], [place] [address] [place], [place] [address]   SARS CORONAVIRUS 2 (TAT 6-24 HRS) Nasopharyngeal Nasopharyngeal Swab     Status: None   Collection Time: 09/19/20  7:00 PM   Specimen: Nasopharyngeal Swab  Result  Value Ref Range Status   SARS Coronavirus 2 NEGATIVE NEGATIVE Final    Comment: (NOTE) SARS-CoV-2 target nucleic acids are NOT DETECTED.  The SARS-CoV-2 RNA is generally detectable in upper and lower respiratory specimens during the acute phase of infection. Negative results do not preclude SARS-CoV-2 infection, do not rule out co-infections with other pathogens, and should not be used as the sole basis for treatment or other patient management decisions. Negative results must be combined with clinical observations, patient history, and epidemiological information. The expected result is Negative.  Fact Sheet for  Patients: SugarRoll.be  Fact Sheet for Healthcare Providers: https://www.woods-mathews.com/  This test is not yet approved or cleared by the [place] FDA and  has been authorized for detection and/or diagnosis of SARS-CoV-2 by FDA under an Emergency Use Authorization (EUA). This EUA will remain  in effect (meaning this test can be used) for the duration of the COVID-19 declaration under Se ction 564(b)(1) of the Act, 21 U.S.C. section 360bbb-3(b)(1), unless the authorization is terminated or revoked sooner.  Performed at [facility], [place] [address], [place], [place] [address]      Radiology Studies: No results found.   Scheduled Meds:  acetaminophen  650 mg Oral Q6H   diclofenac Sodium  2 g Topical QID   enoxaparin (LOVENOX) injection  40 mg Subcutaneous Q24H   pantoprazole  40 mg Oral Daily   polyethylene glycol  17 g Oral BID   senna-docusate  1 tablet Oral BID   Continuous Infusions:   LOS: 4 days    Time spent: 25 mins    Shawna Clamp, MD [facility]   If 7PM-7AM, please contact night-coverage

## 2020-09-22 DIAGNOSIS — S42294A Other nondisplaced fracture of upper end of right humerus, initial encounter for closed fracture: Secondary | ICD-10-CM | POA: Diagnosis not present

## 2020-09-22 LAB — RESP PANEL BY RT-PCR (FLU A&B, COVID) ARPGX2
Influenza A by PCR: NEGATIVE
Influenza B by PCR: NEGATIVE
SARS Coronavirus 2 by RT PCR: NEGATIVE

## 2020-09-22 NOTE — Progress Notes (Signed)
PROGRESS NOTE    [name]  [id number] DOB: Aug 18, 1922 DOA: 09/12/2020  PCP: Aretta Nip, MD   Brief Narrative:  This 87 yrs old female with PMH significant for atrial fibrillation, diastolic heart failure, hypertension, osteoarthritis, breast cancer presented s/p mechanical fall when attempting to transfer from a walker to a commode she fell injuring her right arm this prompted her to come to the hospital.  She uses a wheelchair for transportation.  X-ray right shoulder shows right humeral mildly displaced and angulated oblique versus spiral fracture.  Orthopedics was consulted recommended conservative management.  Patient has been evaluated by physical therapy recommended SNF.  Assessment & Plan:   Principal Problem:   Humerus fracture Active Problems:   Essential hypertension   Atrial fibrillation (HCC)   Diastolic heart failure (HCC)   Primary osteoarthritis of left knee  Right humeral fracture: Patient presented status post mechanical fall. X-ray right shoulder shows right humeral mildly displaced and angulated oblique versus spiral fracture. Orthopedic consulted,  recommended conservative management. Adequate pain control with Tylenol and hydrocodone.  Continue topical diclofenac. Physical therapy recommended skilled nursing facility for rehab. Right upper extremity ultrasound negative for DVT.  Chronic atrial fibrillation: Heart rate remains rate controlled.   Hear rate sinus bradycardia. She has AV nodal disease,  currently not on any AV blocking medications. Patient is not a candidate for anticoagulation due to high fall risk.  Chronic diastolic heart failure.: Echocardiogram from 2015 showed preserved LV systolic function ejection fraction 60- 65%. Patient has been noncompliant with fluid restricted diet.   Patient appears euvolemic, will resume torsemide at discharge.  Leukocytosis: no signs of systemic infection,  resolved.  Constipation:   continue bowel regimen.  DVT prophylaxis: Lovenox Code Status: DNR Family Communication: No family at bed side. Disposition Plan:   Status is: Inpatient  Remains inpatient appropriate because:Inpatient level of care appropriate due to severity of illness  Dispo: The patient is from: Home              Anticipated d/c is to: SNF on 7/25              Patient currently is medically stable to d/c.   Difficult to place patient No   Consultants:  Orthopaedics.  Procedures:  Antimicrobials:   Anti-infectives (From admission, onward)    None       Subjective: Patient was seen and examined at bedside.  Overnight events noted.   She reports the pain in the right shoulder is manageable. She denies any other concerns.  Objective: Vitals:   09/21/20 0439 09/21/20 1349 09/21/20 2034 09/22/20 0545  BP: 132/60 (!) 126/46 (!) 145/53 (!) 143/64  Pulse: (!) 53 (!) 53 (!) 51 (!) 42  Resp: '18 16 20 20  '$ Temp: (!) 97.4 F (36.3 C) 97.8 F (36.6 C) 98.2 F (36.8 C) 97.7 F (36.5 C)  TempSrc: Oral Oral Oral Oral  SpO2: 97% 100% 95% 98%  Weight:      Height:        Intake/Output Summary (Last 24 hours) at 09/22/2020 1138 Last data filed at 09/22/2020 0900 Gross per 24 hour  Intake 480 ml  Output 1000 ml  Net -520 ml   Filed Weights   09/12/20 1127  Weight: 104.3 kg    Examination:  General exam: Appears calm and comfortable , Deconditioned.  Not in acute distress Respiratory system: Clear to auscultation. Respiratory effort normal. Cardiovascular system: S1 & S2 heard, RRR. No JVD, murmurs, rubs, gallops  or clicks. No pedal edema. Gastrointestinal system: Abdomen is nondistended, soft and nontender. No organomegaly or masses felt. Normal bowel sounds heard. Central nervous system: Alert and oriented x 3. No focal neurological deficits. Extremities:  Right arm swelling because of right shoulder fracture, in sling Skin: No rashes, lesions or ulcers Psychiatry: Judgement and  insight appear normal. Mood & affect appropriate.     Data Reviewed: I have personally reviewed following labs and imaging studies  CBC: Recent Labs  Lab 09/21/20 0453  WBC 7.5  HGB 10.7*  HCT 33.1*  MCV 95.4  PLT [id number]   Basic Metabolic Panel: Recent Labs  Lab 09/19/20 0532 09/21/20 0453  NA 133* 139  K 3.7 3.6  CL 90* 101  CO2 31 28  GLUCOSE 92 89  BUN 39* 39*  CREATININE 1.14* 1.01*  CALCIUM 9.0 9.0  MG  --  2.5*  PHOS  --  3.1   GFR: Estimated Creatinine Clearance: 41.4 mL/min (A) (by C-G formula based on SCr of 1.01 mg/dL (H)). Liver Function Tests: No results for input(s): AST, ALT, ALKPHOS, BILITOT, PROT, ALBUMIN in the last 168 hours. No results for input(s): LIPASE, AMYLASE in the last 168 hours. No results for input(s): AMMONIA in the last 168 hours. Coagulation Profile: No results for input(s): INR, PROTIME in the last 168 hours. Cardiac Enzymes: No results for input(s): CKTOTAL, CKMB, CKMBINDEX, TROPONINI in the last 168 hours. BNP (last 3 results) No results for input(s): PROBNP in the last 8760 hours. HbA1C: No results for input(s): HGBA1C in the last 72 hours. CBG: No results for input(s): GLUCAP in the last 168 hours. Lipid Profile: No results for input(s): CHOL, HDL, LDLCALC, TRIG, CHOLHDL, LDLDIRECT in the last 72 hours. Thyroid Function Tests: No results for input(s): TSH, T4TOTAL, FREET4, T3FREE, THYROIDAB in the last 72 hours. Anemia Panel: No results for input(s): VITAMINB12, FOLATE, FERRITIN, TIBC, IRON, RETICCTPCT in the last 72 hours. Sepsis Labs: No results for input(s): PROCALCITON, LATICACIDVEN in the last 168 hours.  Recent Results (from the past 240 hour(s))  SARS CORONAVIRUS 2 (TAT 6-24 HRS) Nasopharyngeal Nasopharyngeal Swab     Status: None   Collection Time: 09/12/20  4:14 PM   Specimen: Nasopharyngeal Swab  Result Value Ref Range Status   SARS Coronavirus 2 NEGATIVE NEGATIVE Final    Comment: (NOTE) SARS-CoV-2 target  nucleic acids are NOT DETECTED.  The SARS-CoV-2 RNA is generally detectable in upper and lower respiratory specimens during the acute phase of infection. Negative results do not preclude SARS-CoV-2 infection, do not rule out co-infections with other pathogens, and should not be used as the sole basis for treatment or other patient management decisions. Negative results must be combined with clinical observations, patient history, and epidemiological information. The expected result is Negative.  Fact Sheet for Patients: SugarRoll.be  Fact Sheet for Healthcare Providers: https://www.woods-mathews.com/  This test is not yet approved or cleared by the [place] FDA and  has been authorized for detection and/or diagnosis of SARS-CoV-2 by FDA under an Emergency Use Authorization (EUA). This EUA will remain  in effect (meaning this test can be used) for the duration of the COVID-19 declaration under Se ction 564(b)(1) of the Act, 21 U.S.C. section 360bbb-3(b)(1), unless the authorization is terminated or revoked sooner.  Performed at [facility], [facility] [address] [place], [place] [address]   SARS CORONAVIRUS 2 (TAT 6-24 HRS) Nasopharyngeal Nasopharyngeal Swab     Status: None   Collection Time: 09/19/20  7:00 PM  Specimen: Nasopharyngeal Swab  Result Value Ref Range Status   SARS Coronavirus 2 NEGATIVE NEGATIVE Final    Comment: (NOTE) SARS-CoV-2 target nucleic acids are NOT DETECTED.  The SARS-CoV-2 RNA is generally detectable in upper and lower respiratory specimens during the acute phase of infection. Negative results do not preclude SARS-CoV-2 infection, do not rule out co-infections with other pathogens, and should not be used as the sole basis for treatment or other patient management decisions. Negative results must be combined with clinical observations, patient history, and epidemiological information. The  expected result is Negative.  Fact Sheet for Patients: SugarRoll.be  Fact Sheet for Healthcare Providers: https://www.woods-mathews.com/  This test is not yet approved or cleared by the [place] FDA and  has been authorized for detection and/or diagnosis of SARS-CoV-2 by FDA under an Emergency Use Authorization (EUA). This EUA will remain  in effect (meaning this test can be used) for the duration of the COVID-19 declaration under Se ction 564(b)(1) of the Act, 21 U.S.C. section 360bbb-3(b)(1), unless the authorization is terminated or revoked sooner.  Performed at [facility], [place] [address], [place], [place] [address]      Radiology Studies: No results found.   Scheduled Meds:  acetaminophen  650 mg Oral Q6H   diclofenac Sodium  2 g Topical QID   enoxaparin (LOVENOX) injection  40 mg Subcutaneous Q24H   pantoprazole  40 mg Oral Daily   polyethylene glycol  17 g Oral BID   senna-docusate  1 tablet Oral BID   Continuous Infusions:   LOS: 5 days    Time spent: 25 mins    Shawna Clamp, MD [facility]   If 7PM-7AM, please contact night-coverage

## 2020-09-23 NOTE — Progress Notes (Signed)
PROGRESS NOTE    [name]  [id number] DOB: [birth year] DOA: 09/12/2020  PCP: Aretta Nip, MD   Brief Narrative:  This 87 yrs old female with PMH significant for atrial fibrillation, diastolic heart failure, hypertension, osteoarthritis, breast cancer presented s/p mechanical fall when attempting to transfer from a walker to a commode she fell injuring her right arm this prompted her to come to the hospital.  She uses a wheelchair for transportation.  X-ray right shoulder shows right humeral mildly displaced and angulated oblique versus spiral fracture.  Orthopedics was consulted recommended conservative management.  Patient has been evaluated by physical therapy recommended SNF.  Patient is being discharged to skilled nursing facility today for rehab.  Assessment & Plan:   Principal Problem:   Humerus fracture Active Problems:   Essential hypertension   Atrial fibrillation (HCC)   Diastolic heart failure (HCC)   Primary osteoarthritis of left knee  Right humeral fracture: Patient presented status post mechanical fall. X-ray right shoulder shows right humeral mildly displaced and angulated oblique versus spiral fracture. Orthopedic was consulted,  recommended conservative management. Adequate pain control with Tylenol and hydrocodone.  Continue topical diclofenac. Physical therapy recommended skilled nursing facility for rehab. Right upper extremity ultrasound negative for DVT.  Chronic atrial fibrillation: Heart rate remains rate controlled.   Heart rate sinus bradycardia. She has AV nodal disease,  currently not on any AV blocking medications. Patient is not a candidate for anticoagulation due to high fall risk.  Chronic diastolic heart failure.: Echocardiogram from 2015 showed preserved LV systolic function ejection fraction 60- 65%. Patient has been noncompliant with fluid restricted diet.   Patient appears euvolemic, will resume torsemide at  discharge.  Leukocytosis: no signs of systemic infection,  resolved.  Constipation:  continue bowel regimen.  DVT prophylaxis: Lovenox Code Status: DNR Family Communication: No family at bed side. Disposition Plan:   Status is: Inpatient  Remains inpatient appropriate because:Inpatient level of care appropriate due to severity of illness  Dispo: The patient is from: Home              Anticipated d/c is to: SNF on 7/25              Patient currently is medically stable to d/c.   Difficult to place patient No   Consultants:  Orthopaedics.  Procedures:  Antimicrobials:   Anti-infectives (From admission, onward)    None       Subjective: Patient was seen and examined at bedside.  Overnight events noted.   She reports pain in the right shoulder is controlled.   Patient is being discharged to nursing home for rehab today. She denies any other concerns.  Objective: Vitals:   09/21/20 2034 09/22/20 0545 09/22/20 2020 09/23/20 0419  BP: (!) 145/53 (!) 143/64 (!) 119/56 (!) 134/52  Pulse: (!) 51 (!) 42 (!) 47 61  Resp: '20 20 20 20  '$ Temp: 98.2 F (36.8 C) 97.7 F (36.5 C) 98.6 F (37 C) (!) 97.5 F (36.4 C)  TempSrc: Oral Oral Oral Oral  SpO2: 95% 98% 96% 97%  Weight:      Height:        Intake/Output Summary (Last 24 hours) at 09/23/2020 1112 Last data filed at 09/23/2020 0956 Gross per 24 hour  Intake 476 ml  Output 1300 ml  Net -824 ml   Filed Weights   09/12/20 1127  Weight: 104.3 kg    Examination:  General exam: Appears calm and comfortable, not in any  acute distress.  Deconditioned. Respiratory system: Clear to auscultation. Respiratory effort normal. Cardiovascular system: S1 & S2 heard, RRR. No JVD, murmurs, rubs, gallops or clicks. No pedal edema. Gastrointestinal system: Abdomen is nondistended, soft and nontender. No organomegaly or masses felt. Normal bowel sounds heard. Central nervous system: Alert and oriented x 3. No focal neurological  deficits. Extremities: Right arm swollen because of the right shoulder fracture.  In sling.   Skin: No rashes, lesions or ulcers Psychiatry: Judgement and insight appear normal. Mood & affect appropriate.     Data Reviewed: I have personally reviewed following labs and imaging studies  CBC: Recent Labs  Lab 09/21/20 0453  WBC 7.5  HGB 10.7*  HCT 33.1*  MCV 95.4  PLT [id number]   Basic Metabolic Panel: Recent Labs  Lab 09/19/20 0532 09/21/20 0453  NA 133* 139  K 3.7 3.6  CL 90* 101  CO2 31 28  GLUCOSE 92 89  BUN 39* 39*  CREATININE 1.14* 1.01*  CALCIUM 9.0 9.0  MG  --  2.5*  PHOS  --  3.1   GFR: Estimated Creatinine Clearance: 41.4 mL/min (A) (by C-G formula based on SCr of 1.01 mg/dL (H)). Liver Function Tests: No results for input(s): AST, ALT, ALKPHOS, BILITOT, PROT, ALBUMIN in the last 168 hours. No results for input(s): LIPASE, AMYLASE in the last 168 hours. No results for input(s): AMMONIA in the last 168 hours. Coagulation Profile: No results for input(s): INR, PROTIME in the last 168 hours. Cardiac Enzymes: No results for input(s): CKTOTAL, CKMB, CKMBINDEX, TROPONINI in the last 168 hours. BNP (last 3 results) No results for input(s): PROBNP in the last 8760 hours. HbA1C: No results for input(s): HGBA1C in the last 72 hours. CBG: No results for input(s): GLUCAP in the last 168 hours. Lipid Profile: No results for input(s): CHOL, HDL, LDLCALC, TRIG, CHOLHDL, LDLDIRECT in the last 72 hours. Thyroid Function Tests: No results for input(s): TSH, T4TOTAL, FREET4, T3FREE, THYROIDAB in the last 72 hours. Anemia Panel: No results for input(s): VITAMINB12, FOLATE, FERRITIN, TIBC, IRON, RETICCTPCT in the last 72 hours. Sepsis Labs: No results for input(s): PROCALCITON, LATICACIDVEN in the last 168 hours.  Recent Results (from the past 240 hour(s))  SARS CORONAVIRUS 2 (TAT 6-24 HRS) Nasopharyngeal Nasopharyngeal Swab     Status: None   Collection Time: 09/19/20   7:00 PM   Specimen: Nasopharyngeal Swab  Result Value Ref Range Status   SARS Coronavirus 2 NEGATIVE NEGATIVE Final    Comment: (NOTE) SARS-CoV-2 target nucleic acids are NOT DETECTED.  The SARS-CoV-2 RNA is generally detectable in upper and lower respiratory specimens during the acute phase of infection. Negative results do not preclude SARS-CoV-2 infection, do not rule out co-infections with other pathogens, and should not be used as the sole basis for treatment or other patient management decisions. Negative results must be combined with clinical observations, patient history, and epidemiological information. The expected result is Negative.  Fact Sheet for Patients: SugarRoll.be  Fact Sheet for Healthcare Providers: https://www.woods-mathews.com/  This test is not yet approved or cleared by the [place] FDA and  has been authorized for detection and/or diagnosis of SARS-CoV-2 by FDA under an Emergency Use Authorization (EUA). This EUA will remain  in effect (meaning this test can be used) for the duration of the COVID-19 declaration under Se ction 564(b)(1) of the Act, 21 U.S.C. section 360bbb-3(b)(1), unless the authorization is terminated or revoked sooner.  Performed at [facility], [place],  [place] [address]   Resp Panel by RT-PCR (Flu A&B, Covid) Nasopharyngeal Swab     Status: None   Collection Time: 09/22/20  4:14 PM   Specimen: Nasopharyngeal Swab; Nasopharyngeal(NP) swabs in vial transport medium  Result Value Ref Range Status   SARS Coronavirus 2 by RT PCR NEGATIVE NEGATIVE Final    Comment: (NOTE) SARS-CoV-2 target nucleic acids are NOT DETECTED.  The SARS-CoV-2 RNA is generally detectable in upper respiratory specimens during the acute phase of infection. The lowest concentration of SARS-CoV-2 viral copies this assay can detect is 138 copies/mL. A negative result does not preclude  SARS-Cov-2 infection and should not be used as the sole basis for treatment or other patient management decisions. A negative result may occur with  improper specimen collection/handling, submission of specimen other than nasopharyngeal swab, presence of viral mutation(s) within the areas targeted by this assay, and inadequate number of viral copies(<138 copies/mL). A negative result must be combined with clinical observations, patient history, and epidemiological information. The expected result is Negative.  Fact Sheet for Patients:  EntrepreneurPulse.com.au  Fact Sheet for Healthcare Providers:  IncredibleEmployment.be  This test is no t yet approved or cleared by the [place] FDA and  has been authorized for detection and/or diagnosis of SARS-CoV-2 by FDA under an Emergency Use Authorization (EUA). This EUA will remain  in effect (meaning this test can be used) for the duration of the COVID-19 declaration under Section 564(b)(1) of the Act, 21 U.S.C.section 360bbb-3(b)(1), unless the authorization is terminated  or revoked sooner.       Influenza A by PCR NEGATIVE NEGATIVE Final   Influenza B by PCR NEGATIVE NEGATIVE Final    Comment: (NOTE) The Xpert Xpress SARS-CoV-2/FLU/RSV plus assay is intended as an aid in the diagnosis of influenza from Nasopharyngeal swab specimens and should not be used as a sole basis for treatment. Nasal washings and aspirates are unacceptable for Xpert Xpress SARS-CoV-2/FLU/RSV testing.  Fact Sheet for Patients: EntrepreneurPulse.com.au  Fact Sheet for Healthcare Providers: IncredibleEmployment.be  This test is not yet approved or cleared by the [place] FDA and has been authorized for detection and/or diagnosis of SARS-CoV-2 by FDA under an Emergency Use Authorization (EUA). This EUA will remain in effect (meaning this test can be used) for the duration of  the COVID-19 declaration under Section 564(b)(1) of the Act, 21 U.S.C. section 360bbb-3(b)(1), unless the authorization is terminated or revoked.  Performed at [facility], [place] [address] [place], [place]  [address]      Radiology Studies: No results found.   Scheduled Meds:  acetaminophen  650 mg Oral Q6H   diclofenac Sodium  2 g Topical QID   enoxaparin (LOVENOX) injection  40 mg Subcutaneous Q24H   pantoprazole  40 mg Oral Daily   polyethylene glycol  17 g Oral BID   senna-docusate  1 tablet Oral BID   Continuous Infusions:   LOS: 6 days    Time spent: 25 mins    Shawna Clamp, MD [facility]   If 7PM-7AM, please contact night-coverage

## 2020-09-23 NOTE — Care Management Important Message (Signed)
Important Message  Patient Details IM Letter placed in Patient's room. Name: [name] MRN: [id number] Date of Birth: 1922/11/22   Medicare Important Message Given:  Yes     Kerin Salen 09/23/2020, 11:42 AM

## 2020-09-23 NOTE — TOC Progression Note (Addendum)
Transition of Care [facility]) - Progression Note    Patient Details  Name: [name] MRN: [id number] Date of Birth: Apr 30, 1922  Transition of Care [facility]) CM/SW Contact  Ross Ludwig, [place] Phone Number: 09/23/2020, 4:28 PM  Clinical Narrative:     Patient was wanting to go to [facility] or [facility] home today and insurance approval is valid till end of day today.  Per SNFs a bed is not availability till tomorrow.  CSW updated attending physician, patient, and charge nurse.  CSW received a phone call that [facility] has a bed available for tomorrow, CSW informed patient, she would like to go to [facility].  CSW called Bernadene Bell to discuss extending insurance authorization one more day by changing start date or next review date to 09/24/20.  CSW spoke to case manager at [facility] and she said she has to speak to her supervisor to determine what needs to be done to extend it.  [employer] Josem Kaufmann is only valid for 6 days from start of care day.   CSW contacted [facility] and they are going to contact [place] as well to discuss extending approval date one more day.  Per [employer], patient's Josem Kaufmann will have to be restarted because it expires today.  CSW attempted to contact [place], spoke to rep case manager and she stated that auth was only valid for up to six days after initial reference date.   CSW contacted [facility] rehab director Murlean Hark, and asked to have PT and OT see patient tomorrow because insurance company will need updated notes for authorization.  CSW spoke to [place], assigned case manger has submitted request to the person who needs to give the approval.  CSW updated Bernadene Bell that [facility] worker Roque Lias will be following patient tomorrow and to contact him on Tuesday.  CSW updated Claiborne Billings at [facility], she will follow up with Barbaraann Rondo tomorrow.  TOC to continue to follow patient's progress.   Expected Discharge Plan:  Skilled Nursing Facility Barriers to Discharge: Other (must enter comment) (confirmation of bed availability)  Expected Discharge Plan and Services Expected Discharge Plan: [place]   Discharge Planning Services: CM Consult Post Acute Care Choice: [place] arrangements for the past 2 months: Single Family Home Expected Discharge Date: 09/23/20                                     Social Determinants of Health (SDOH) Interventions    Readmission Risk Interventions No flowsheet data found.

## 2020-09-24 DIAGNOSIS — I10 Essential (primary) hypertension: Secondary | ICD-10-CM | POA: Diagnosis not present

## 2020-09-24 DIAGNOSIS — S90822D Blister (nonthermal), left foot, subsequent encounter: Secondary | ICD-10-CM | POA: Diagnosis not present

## 2020-09-24 DIAGNOSIS — M25511 Pain in right shoulder: Secondary | ICD-10-CM | POA: Diagnosis not present

## 2020-09-24 DIAGNOSIS — I5030 Unspecified diastolic (congestive) heart failure: Secondary | ICD-10-CM | POA: Diagnosis not present

## 2020-09-24 DIAGNOSIS — R0602 Shortness of breath: Secondary | ICD-10-CM | POA: Diagnosis not present

## 2020-09-24 DIAGNOSIS — S90821D Blister (nonthermal), right foot, subsequent encounter: Secondary | ICD-10-CM | POA: Diagnosis not present

## 2020-09-24 DIAGNOSIS — E569 Vitamin deficiency, unspecified: Secondary | ICD-10-CM | POA: Diagnosis not present

## 2020-09-24 DIAGNOSIS — N390 Urinary tract infection, site not specified: Secondary | ICD-10-CM | POA: Diagnosis not present

## 2020-09-24 DIAGNOSIS — I4819 Other persistent atrial fibrillation: Secondary | ICD-10-CM | POA: Diagnosis not present

## 2020-09-24 DIAGNOSIS — S91301D Unspecified open wound, right foot, subsequent encounter: Secondary | ICD-10-CM | POA: Diagnosis not present

## 2020-09-24 DIAGNOSIS — I5032 Chronic diastolic (congestive) heart failure: Secondary | ICD-10-CM | POA: Diagnosis not present

## 2020-09-24 DIAGNOSIS — I4821 Permanent atrial fibrillation: Secondary | ICD-10-CM | POA: Diagnosis not present

## 2020-09-24 DIAGNOSIS — R053 Chronic cough: Secondary | ICD-10-CM | POA: Diagnosis not present

## 2020-09-24 DIAGNOSIS — M542 Cervicalgia: Secondary | ICD-10-CM | POA: Diagnosis not present

## 2020-09-24 DIAGNOSIS — J449 Chronic obstructive pulmonary disease, unspecified: Secondary | ICD-10-CM | POA: Diagnosis not present

## 2020-09-24 DIAGNOSIS — M19011 Primary osteoarthritis, right shoulder: Secondary | ICD-10-CM | POA: Diagnosis not present

## 2020-09-24 DIAGNOSIS — S42331D Displaced oblique fracture of shaft of humerus, right arm, subsequent encounter for fracture with routine healing: Secondary | ICD-10-CM | POA: Diagnosis not present

## 2020-09-24 DIAGNOSIS — R600 Localized edema: Secondary | ICD-10-CM | POA: Diagnosis not present

## 2020-09-24 DIAGNOSIS — R059 Cough, unspecified: Secondary | ICD-10-CM | POA: Diagnosis not present

## 2020-09-24 DIAGNOSIS — L539 Erythematous condition, unspecified: Secondary | ICD-10-CM | POA: Diagnosis not present

## 2020-09-24 DIAGNOSIS — M79605 Pain in left leg: Secondary | ICD-10-CM | POA: Diagnosis not present

## 2020-09-24 DIAGNOSIS — R29898 Other symptoms and signs involving the musculoskeletal system: Secondary | ICD-10-CM | POA: Diagnosis not present

## 2020-09-24 DIAGNOSIS — M179 Osteoarthritis of knee, unspecified: Secondary | ICD-10-CM | POA: Diagnosis not present

## 2020-09-24 DIAGNOSIS — I509 Heart failure, unspecified: Secondary | ICD-10-CM | POA: Diagnosis not present

## 2020-09-24 DIAGNOSIS — M62838 Other muscle spasm: Secondary | ICD-10-CM | POA: Diagnosis not present

## 2020-09-24 DIAGNOSIS — G8929 Other chronic pain: Secondary | ICD-10-CM | POA: Diagnosis not present

## 2020-09-24 DIAGNOSIS — K1121 Acute sialoadenitis: Secondary | ICD-10-CM | POA: Diagnosis not present

## 2020-09-24 DIAGNOSIS — R5383 Other fatigue: Secondary | ICD-10-CM | POA: Diagnosis not present

## 2020-09-24 DIAGNOSIS — R6889 Other general symptoms and signs: Secondary | ICD-10-CM | POA: Diagnosis not present

## 2020-09-24 DIAGNOSIS — Z743 Need for continuous supervision: Secondary | ICD-10-CM | POA: Diagnosis not present

## 2020-09-24 DIAGNOSIS — S42301A Unspecified fracture of shaft of humerus, right arm, initial encounter for closed fracture: Secondary | ICD-10-CM | POA: Diagnosis not present

## 2020-09-24 DIAGNOSIS — S42301D Unspecified fracture of shaft of humerus, right arm, subsequent encounter for fracture with routine healing: Secondary | ICD-10-CM | POA: Diagnosis not present

## 2020-09-24 DIAGNOSIS — R062 Wheezing: Secondary | ICD-10-CM | POA: Diagnosis not present

## 2020-09-24 DIAGNOSIS — I4891 Unspecified atrial fibrillation: Secondary | ICD-10-CM | POA: Diagnosis not present

## 2020-09-24 DIAGNOSIS — I5033 Acute on chronic diastolic (congestive) heart failure: Secondary | ICD-10-CM | POA: Diagnosis not present

## 2020-09-24 DIAGNOSIS — K5903 Drug induced constipation: Secondary | ICD-10-CM | POA: Diagnosis not present

## 2020-09-24 DIAGNOSIS — S42201D Unspecified fracture of upper end of right humerus, subsequent encounter for fracture with routine healing: Secondary | ICD-10-CM | POA: Diagnosis not present

## 2020-09-24 DIAGNOSIS — R5381 Other malaise: Secondary | ICD-10-CM | POA: Diagnosis not present

## 2020-09-24 DIAGNOSIS — S91302D Unspecified open wound, left foot, subsequent encounter: Secondary | ICD-10-CM | POA: Diagnosis not present

## 2020-09-24 DIAGNOSIS — R221 Localized swelling, mass and lump, neck: Secondary | ICD-10-CM | POA: Diagnosis not present

## 2020-09-24 NOTE — Progress Notes (Signed)
Attempted to call report. LM for return call. Will try again later.

## 2020-09-24 NOTE — TOC Transition Note (Signed)
Transition of Care [facility]) - CM/SW Discharge Note   Patient Details  Name: [name] MRN: [id number] Date of Birth: [birth year]  [facility] of [facility] CM/SW Contact:  Trish Mage, LCSW Phone Number: 09/24/2020, 12:24 PM   Clinical Narrative:   Patient who is stable for d/c will transfer to [facility] today.  Family alerted.  PTAR arranged.  Nursing, please call report to [phone], room 612.  TOC sign off.    Final next level of care: Skilled Nursing Facility Barriers to Discharge: Barriers Resolved   Patient Goals and CMS Choice     Choice offered to / list presented to : Patient  Discharge Placement                       Discharge Plan and Services   Discharge Planning Services: CM Consult Post Acute Care Choice: [place]                               Social Determinants of Health (SDOH) Interventions     Readmission Risk Interventions No flowsheet data found.

## 2020-09-24 NOTE — Progress Notes (Signed)
Physical Therapy Treatment Patient Details Name: [name] MRN: [id number] DOB: 12-27-1922 Today's Date: 09/24/2020    History of Present Illness [name] is a 87 y.o. female with medical history significant of atrial fibrillation, diastolic heart failure, primary hypertension, osteoarthritis, history of breast cancer.  Patient fell transferring to toilet and injuring right arm. Patient found to have right humeral fracture. Being treated conservatively. NWB with sling.    PT Comments    Pt motivated to sit at EOB, able to inch BLE towards EOB with min assist, ultimately requiring total A to clear BLE and upright trunk into sitting EOB. Pt tolerates sitting ~10 minutes, performing LE strengthening exercises and trunk shifting challenging balance. RN in room to assess pt's skin integrity at brace site. Pt fatigues with sitting EOB, requiring intermittent UE support at EOB. Assisted back to supine and to comfort. Pt in supine with RN in room at [facility]. Will continue to progress acute PT as able; pt plans to d/c to SNF today.    Follow Up Recommendations  SNF     Equipment Recommendations  None recommended by PT    Recommendations for Other Services       Precautions / Restrictions Precautions Precautions: Fall Precaution Comments: NWB RUE, left knee/shin pain Required Braces or Orthoses: Sling Restrictions Weight Bearing Restrictions: Yes RUE Weight Bearing: Non weight bearing    Mobility  Bed Mobility Overal bed mobility: Needs Assistance Bed Mobility: Supine to Sit;Sit to Supine  Supine to sit: Total assist;+2 for physical assistance;+2 for safety/equipment;HOB elevated Sit to supine: Total assist;+2 for physical assistance;+2 for safety/equipment   General bed mobility comments: pt inches LEs over towards EOB 1 at a time, ultimately requiring total A to clear LE to EOB and upright trunk into sitting; total A to lift BLE back into bed and reposition to supine     Transfers  General transfer comment: not attempted  Ambulation/Gait                 Stairs             Wheelchair Mobility    Modified Rankin (Stroke Patients Only)       Balance Overall balance assessment: Needs assistance Sitting-balance support: No upper extremity supported;Feet supported Sitting balance-Leahy Scale: Fair Sitting balance - Comments: pt tolerates sitting EOB for ~10 minutes with intermittent HHA, with fatigue L trunk lean occurs         Cognition Arousal/Alertness: Awake/alert Behavior During Therapy: WFL for tasks assessed/performed Overall Cognitive Status: Within Functional Limits for tasks assessed         Exercises General Exercises - Lower Extremity Long Arc Quad: Seated;AROM;Strengthening;Both;10 reps    General Comments General comments (skin integrity, edema, etc.): RN in room to assess skin integrity from UE sling/brace      Pertinent Vitals/Pain Pain Assessment: Faces Faces Pain Scale: Hurts little more Pain Location: R arm Pain Descriptors / Indicators: Grimacing;Aching;Guarding Pain Intervention(s): Limited activity within patient's tolerance;Monitored during session    Home Living                      Prior Function            PT Goals (current goals can now be found in the care plan section) Acute Rehab PT Goals Patient Stated Goal: stand and transfer to toilet PT Goal Formulation: With patient Time For Goal Achievement: 09/27/20 Potential to Achieve Goals: Fair Progress towards PT goals: Progressing toward goals  Frequency    Min 2X/week      PT Plan Current plan remains appropriate    Co-evaluation PT/OT/SLP Co-Evaluation/Treatment: Yes Reason for Co-Treatment: For patient/therapist safety;To address functional/ADL transfers PT goals addressed during session: Mobility/safety with mobility;Balance OT goals addressed during session: ADL's and self-care      AM-PAC PT "6 Clicks"  Mobility   Outcome Measure  Help needed turning from your back to your side while in a flat bed without using bedrails?: Total Help needed moving from lying on your back to sitting on the side of a flat bed without using bedrails?: Total Help needed moving to and from a bed to a chair (including a wheelchair)?: Total Help needed standing up from a chair using your arms (e.g., wheelchair or bedside chair)?: Total Help needed to walk in hospital room?: Total Help needed climbing 3-5 steps with a railing? : Total 6 Click Score: 6    End of Session   Activity Tolerance: Patient tolerated treatment well Patient left: in bed;with call bell/phone within reach;with nursing/sitter in room Nurse Communication: Mobility status;Other (comment) (skin integrity) PT Visit Diagnosis: Muscle weakness (generalized) (M62.81);Other abnormalities of gait and mobility (R26.89)     Time: [id number] PT Time Calculation (min) (ACUTE ONLY): 23 min  Charges:  $Therapeutic Activity: 8-22 mins                      Tori Deeksha Cotrell PT, DPT 09/24/20, 10:34 AM

## 2020-09-24 NOTE — Care Plan (Signed)
Patient was discharged yesterday but due to no availability of bed,  patient is being discharged today.   Patient was seen and examined , no concerns noted.  Patient is being discharged to rehab today.

## 2020-09-24 NOTE — Progress Notes (Signed)
Spoke w/ Lanelle Bal at [facility] and gave report.

## 2020-09-24 NOTE — Progress Notes (Addendum)
Occupational Therapy Treatment Patient Details Name: [name] MRN: [id number] DOB: 10/10/1922 Today's Date: 09/24/2020    History of present illness [name] is a 87 y.o. female with medical history significant of atrial fibrillation, diastolic heart failure, primary hypertension, osteoarthritis, history of breast cancer.  Patient fell transferring to toilet and injuring right arm. Patient found to have right humeral fracture. Being treated conservatively. NWB with sling.   OT comments   Patient was TD for rolling to L side in bed. patient was max A x 2 for supine to sit on edge of bed with HOB raised. patient was noted to have humeral fracture orthosis out of place on RUE with skin breakdown between upper arm and trunk. nursing was consulted about concerns over positioning of RUE in orthosis and skin breakdown. humeral fracture orthosis was removed with TD on this date with nurse present. patient was assisted with foward leaning for clearance for nursing to clean and dress area. Patient tolerated session well. Patient's d/c plan remains appropriate at this time. Patient plans to d/c to SNF today.   Follow Up Recommendations  SNF    Equipment Recommendations  None recommended by OT    Recommendations for Other Services      Precautions / Restrictions Precautions Precautions: Fall Precaution Comments: NWB RUE, left knee/shin pain Required Braces or Orthoses: Sling Restrictions Weight Bearing Restrictions: Yes RUE Weight Bearing: Non weight bearing RLE Weight Bearing: Weight bearing as tolerated LLE Weight Bearing: Weight bearing as tolerated       Mobility Bed Mobility Overal bed mobility: Needs Assistance Bed Mobility: Supine to Sit;Sit to Supine     Supine to sit: Total assist;+2 for physical assistance;+2 for safety/equipment;HOB elevated Sit to supine: Total assist;+2 for physical assistance;+2 for safety/equipment   General bed mobility comments: required  total assist for upper and lower body movement and positioning to get to/from EOB    Transfers                      Balance Overall balance assessment: Needs assistance Sitting-balance support: No upper extremity supported;Feet supported Sitting balance-Leahy Scale: Fair                                     ADL either performed or assessed with clinical judgement   ADL       Grooming: Wash/dry face;Brushing hair;Supervision/safety;Min guard;Sitting;Cueing for UE precautions Grooming Details (indicate cue type and reason): patient participated in washing face and combing from of hair with min guard sitting on edge of bed with increased time.                               General ADL Comments: patient was max A x 2 for supine to sit on edge of bed with HOB raised. patient was noted to have humeral fracture orthosis out of place with skin breakdown between upper arm and trunk. nursing was consulted. patient was assisted with foward leaning for clearance for nursing to clean and dress area. humeral fracture brace was removed with TD on this date with nurse present.     Vision Patient Visual Report: No change from baseline     Perception     Praxis      Cognition Arousal/Alertness: Awake/alert Behavior During Therapy: WFL for tasks assessed/performed Overall Cognitive Status: Within Functional Limits  for tasks assessed                                          Exercises     Shoulder Instructions       General Comments      Pertinent Vitals/ Pain       Pain Assessment: Faces Faces Pain Scale: Hurts little more Pain Location: R arm Pain Descriptors / Indicators: Grimacing;Aching;Guarding Pain Intervention(s): Limited activity within patient's tolerance;Monitored during session  Home Living                                          Prior Functioning/Environment              Frequency  Min  2X/week        Progress Toward Goals  OT Goals(current goals can now be found in the care plan section)  Progress towards OT goals: Progressing toward goals  Acute Rehab OT Goals Patient Stated Goal: stand and transfer to toilet OT Goal Formulation: With patient Time For Goal Achievement: 09/27/20 Potential to Achieve Goals: [place] Discharge plan remains appropriate    Co-evaluation        PT goals addressed during session: Mobility/safety with mobility OT goals addressed during session: ADL's and self-care      AM-PAC OT "6 Clicks" Daily Activity     Outcome Measure   Help from another person eating meals?: A Little Help from another person taking care of personal grooming?: A Little Help from another person toileting, which includes using toliet, bedpan, or urinal?: Total Help from another person bathing (including washing, rinsing, drying)?: Total Help from another person to put on and taking off regular upper body clothing?: Total Help from another person to put on and taking off regular lower body clothing?: Total 6 Click Score: 10    End of Session    OT Visit Diagnosis: Muscle weakness (generalized) (M62.81);Pain Pain - Right/Left: Right Pain - part of body: Shoulder   Activity Tolerance Patient tolerated treatment well   Patient Left in bed;with call bell/phone within reach;with bed alarm set   Nurse Communication Other (comment) (skin breakdown under L forearm)        Time: [id number] OT Time Calculation (min): 15 min  Charges: OT General Charges $OT Visit: 1 Visit OT Treatments $Self Care/Home Management : 8-22 mins  Jackelyn Poling OTR/L, MS Acute Rehabilitation Department Office# [phone] Pager# [phone]    [place] 09/24/2020, 10:27 AM

## 2020-09-26 DIAGNOSIS — I10 Essential (primary) hypertension: Secondary | ICD-10-CM | POA: Diagnosis not present

## 2020-09-26 DIAGNOSIS — M79605 Pain in left leg: Secondary | ICD-10-CM | POA: Diagnosis not present

## 2020-09-26 DIAGNOSIS — I4819 Other persistent atrial fibrillation: Secondary | ICD-10-CM | POA: Diagnosis not present

## 2020-09-26 DIAGNOSIS — S42201D Unspecified fracture of upper end of right humerus, subsequent encounter for fracture with routine healing: Secondary | ICD-10-CM | POA: Diagnosis not present

## 2020-09-26 DIAGNOSIS — I5033 Acute on chronic diastolic (congestive) heart failure: Secondary | ICD-10-CM | POA: Diagnosis not present

## 2020-09-28 ENCOUNTER — Other Ambulatory Visit: Payer: Self-pay | Admitting: Cardiovascular Disease

## 2020-09-30 DIAGNOSIS — M62838 Other muscle spasm: Secondary | ICD-10-CM | POA: Diagnosis not present

## 2020-09-30 DIAGNOSIS — S90821D Blister (nonthermal), right foot, subsequent encounter: Secondary | ICD-10-CM | POA: Diagnosis not present

## 2020-09-30 DIAGNOSIS — K5903 Drug induced constipation: Secondary | ICD-10-CM | POA: Diagnosis not present

## 2020-09-30 DIAGNOSIS — M79605 Pain in left leg: Secondary | ICD-10-CM | POA: Diagnosis not present

## 2020-09-30 DIAGNOSIS — S90822D Blister (nonthermal), left foot, subsequent encounter: Secondary | ICD-10-CM | POA: Diagnosis not present

## 2020-09-30 DIAGNOSIS — S42201D Unspecified fracture of upper end of right humerus, subsequent encounter for fracture with routine healing: Secondary | ICD-10-CM | POA: Diagnosis not present

## 2020-10-01 DIAGNOSIS — I5032 Chronic diastolic (congestive) heart failure: Secondary | ICD-10-CM | POA: Diagnosis not present

## 2020-10-01 DIAGNOSIS — I4821 Permanent atrial fibrillation: Secondary | ICD-10-CM | POA: Diagnosis not present

## 2020-10-01 DIAGNOSIS — S42301D Unspecified fracture of shaft of humerus, right arm, subsequent encounter for fracture with routine healing: Secondary | ICD-10-CM | POA: Diagnosis not present

## 2020-10-01 DIAGNOSIS — I10 Essential (primary) hypertension: Secondary | ICD-10-CM | POA: Diagnosis not present

## 2020-10-02 DIAGNOSIS — S42201D Unspecified fracture of upper end of right humerus, subsequent encounter for fracture with routine healing: Secondary | ICD-10-CM | POA: Diagnosis not present

## 2020-10-02 DIAGNOSIS — M79605 Pain in left leg: Secondary | ICD-10-CM | POA: Diagnosis not present

## 2020-10-02 DIAGNOSIS — K5903 Drug induced constipation: Secondary | ICD-10-CM | POA: Diagnosis not present

## 2020-10-02 DIAGNOSIS — R0602 Shortness of breath: Secondary | ICD-10-CM | POA: Diagnosis not present

## 2020-10-03 DIAGNOSIS — R062 Wheezing: Secondary | ICD-10-CM | POA: Diagnosis not present

## 2020-10-03 DIAGNOSIS — R600 Localized edema: Secondary | ICD-10-CM | POA: Diagnosis not present

## 2020-10-03 DIAGNOSIS — R0602 Shortness of breath: Secondary | ICD-10-CM | POA: Diagnosis not present

## 2020-10-03 DIAGNOSIS — M79605 Pain in left leg: Secondary | ICD-10-CM | POA: Diagnosis not present

## 2020-10-03 DIAGNOSIS — I5032 Chronic diastolic (congestive) heart failure: Secondary | ICD-10-CM | POA: Diagnosis not present

## 2020-10-09 DIAGNOSIS — S90822D Blister (nonthermal), left foot, subsequent encounter: Secondary | ICD-10-CM | POA: Diagnosis not present

## 2020-10-09 DIAGNOSIS — S90821D Blister (nonthermal), right foot, subsequent encounter: Secondary | ICD-10-CM | POA: Diagnosis not present

## 2020-10-09 DIAGNOSIS — M79605 Pain in left leg: Secondary | ICD-10-CM | POA: Diagnosis not present

## 2020-10-09 DIAGNOSIS — M25511 Pain in right shoulder: Secondary | ICD-10-CM | POA: Diagnosis not present

## 2020-10-14 DIAGNOSIS — J449 Chronic obstructive pulmonary disease, unspecified: Secondary | ICD-10-CM | POA: Diagnosis not present

## 2020-10-14 DIAGNOSIS — G8929 Other chronic pain: Secondary | ICD-10-CM | POA: Diagnosis not present

## 2020-10-14 DIAGNOSIS — R053 Chronic cough: Secondary | ICD-10-CM | POA: Diagnosis not present

## 2020-10-16 DIAGNOSIS — R600 Localized edema: Secondary | ICD-10-CM | POA: Diagnosis not present

## 2020-10-16 DIAGNOSIS — S42331D Displaced oblique fracture of shaft of humerus, right arm, subsequent encounter for fracture with routine healing: Secondary | ICD-10-CM | POA: Diagnosis not present

## 2020-10-16 DIAGNOSIS — I509 Heart failure, unspecified: Secondary | ICD-10-CM | POA: Diagnosis not present

## 2020-10-22 DIAGNOSIS — S91302D Unspecified open wound, left foot, subsequent encounter: Secondary | ICD-10-CM | POA: Diagnosis not present

## 2020-10-22 DIAGNOSIS — I509 Heart failure, unspecified: Secondary | ICD-10-CM | POA: Diagnosis not present

## 2020-10-22 DIAGNOSIS — S91301D Unspecified open wound, right foot, subsequent encounter: Secondary | ICD-10-CM | POA: Diagnosis not present

## 2020-10-22 DIAGNOSIS — R600 Localized edema: Secondary | ICD-10-CM | POA: Diagnosis not present

## 2020-10-24 DIAGNOSIS — L539 Erythematous condition, unspecified: Secondary | ICD-10-CM | POA: Diagnosis not present

## 2020-10-24 DIAGNOSIS — S42201D Unspecified fracture of upper end of right humerus, subsequent encounter for fracture with routine healing: Secondary | ICD-10-CM | POA: Diagnosis not present

## 2020-10-24 DIAGNOSIS — R600 Localized edema: Secondary | ICD-10-CM | POA: Diagnosis not present

## 2020-10-24 DIAGNOSIS — I5033 Acute on chronic diastolic (congestive) heart failure: Secondary | ICD-10-CM | POA: Diagnosis not present

## 2020-10-29 DIAGNOSIS — R600 Localized edema: Secondary | ICD-10-CM | POA: Diagnosis not present

## 2020-10-29 DIAGNOSIS — L539 Erythematous condition, unspecified: Secondary | ICD-10-CM | POA: Diagnosis not present

## 2020-10-29 DIAGNOSIS — K1121 Acute sialoadenitis: Secondary | ICD-10-CM | POA: Diagnosis not present

## 2020-10-29 DIAGNOSIS — R053 Chronic cough: Secondary | ICD-10-CM | POA: Diagnosis not present

## 2020-10-29 DIAGNOSIS — M542 Cervicalgia: Secondary | ICD-10-CM | POA: Diagnosis not present

## 2020-10-31 DIAGNOSIS — K1121 Acute sialoadenitis: Secondary | ICD-10-CM | POA: Diagnosis not present

## 2020-10-31 DIAGNOSIS — M542 Cervicalgia: Secondary | ICD-10-CM | POA: Diagnosis not present

## 2020-10-31 DIAGNOSIS — R053 Chronic cough: Secondary | ICD-10-CM | POA: Diagnosis not present

## 2020-11-13 DIAGNOSIS — S42331D Displaced oblique fracture of shaft of humerus, right arm, subsequent encounter for fracture with routine healing: Secondary | ICD-10-CM | POA: Diagnosis not present

## 2020-11-20 DIAGNOSIS — B372 Candidiasis of skin and nail: Secondary | ICD-10-CM | POA: Diagnosis not present

## 2020-11-20 DIAGNOSIS — L539 Erythematous condition, unspecified: Secondary | ICD-10-CM | POA: Diagnosis not present

## 2020-12-10 DIAGNOSIS — R221 Localized swelling, mass and lump, neck: Secondary | ICD-10-CM | POA: Diagnosis not present

## 2020-12-10 DIAGNOSIS — R410 Disorientation, unspecified: Secondary | ICD-10-CM | POA: Diagnosis not present

## 2020-12-10 DIAGNOSIS — R300 Dysuria: Secondary | ICD-10-CM | POA: Diagnosis not present

## 2020-12-10 DIAGNOSIS — R062 Wheezing: Secondary | ICD-10-CM | POA: Diagnosis not present

## 2020-12-10 DIAGNOSIS — R058 Other specified cough: Secondary | ICD-10-CM | POA: Diagnosis not present

## 2020-12-10 DIAGNOSIS — R051 Acute cough: Secondary | ICD-10-CM | POA: Diagnosis not present

## 2020-12-10 DIAGNOSIS — L89609 Pressure ulcer of unspecified heel, unspecified stage: Secondary | ICD-10-CM | POA: Diagnosis not present

## 2020-12-11 DIAGNOSIS — I10 Essential (primary) hypertension: Secondary | ICD-10-CM | POA: Diagnosis not present

## 2020-12-11 DIAGNOSIS — S42201D Unspecified fracture of upper end of right humerus, subsequent encounter for fracture with routine healing: Secondary | ICD-10-CM | POA: Diagnosis not present

## 2020-12-11 DIAGNOSIS — N390 Urinary tract infection, site not specified: Secondary | ICD-10-CM | POA: Diagnosis not present

## 2020-12-12 ENCOUNTER — Other Ambulatory Visit: Payer: Self-pay | Admitting: *Deleted

## 2020-12-12 DIAGNOSIS — R7989 Other specified abnormal findings of blood chemistry: Secondary | ICD-10-CM | POA: Diagnosis not present

## 2020-12-12 DIAGNOSIS — M19011 Primary osteoarthritis, right shoulder: Secondary | ICD-10-CM | POA: Diagnosis not present

## 2020-12-12 DIAGNOSIS — R053 Chronic cough: Secondary | ICD-10-CM | POA: Diagnosis not present

## 2020-12-12 DIAGNOSIS — E039 Hypothyroidism, unspecified: Secondary | ICD-10-CM | POA: Diagnosis not present

## 2020-12-12 DIAGNOSIS — S42301D Unspecified fracture of shaft of humerus, right arm, subsequent encounter for fracture with routine healing: Secondary | ICD-10-CM | POA: Diagnosis not present

## 2020-12-12 DIAGNOSIS — R221 Localized swelling, mass and lump, neck: Secondary | ICD-10-CM | POA: Diagnosis not present

## 2020-12-16 DIAGNOSIS — R7989 Other specified abnormal findings of blood chemistry: Secondary | ICD-10-CM | POA: Diagnosis not present

## 2020-12-16 DIAGNOSIS — R053 Chronic cough: Secondary | ICD-10-CM | POA: Diagnosis not present

## 2020-12-16 DIAGNOSIS — R443 Hallucinations, unspecified: Secondary | ICD-10-CM | POA: Diagnosis not present

## 2020-12-16 DIAGNOSIS — E039 Hypothyroidism, unspecified: Secondary | ICD-10-CM | POA: Diagnosis not present

## 2020-12-16 DIAGNOSIS — R7981 Abnormal blood-gas level: Secondary | ICD-10-CM | POA: Diagnosis not present

## 2020-12-17 DIAGNOSIS — I10 Essential (primary) hypertension: Secondary | ICD-10-CM | POA: Diagnosis not present

## 2020-12-17 DIAGNOSIS — E569 Vitamin deficiency, unspecified: Secondary | ICD-10-CM | POA: Diagnosis not present

## 2020-12-24 DIAGNOSIS — J418 Mixed simple and mucopurulent chronic bronchitis: Secondary | ICD-10-CM | POA: Diagnosis not present

## 2020-12-24 DIAGNOSIS — R053 Chronic cough: Secondary | ICD-10-CM | POA: Diagnosis not present

## 2021-01-08 ENCOUNTER — Other Ambulatory Visit: Payer: Self-pay

## 2021-01-08 ENCOUNTER — Encounter (HOSPITAL_BASED_OUTPATIENT_CLINIC_OR_DEPARTMENT_OTHER): Payer: Medicare PPO | Attending: Physician Assistant | Admitting: Physician Assistant

## 2021-01-08 DIAGNOSIS — M1990 Unspecified osteoarthritis, unspecified site: Secondary | ICD-10-CM | POA: Diagnosis not present

## 2021-01-08 DIAGNOSIS — L89620 Pressure ulcer of left heel, unstageable: Secondary | ICD-10-CM | POA: Insufficient documentation

## 2021-01-08 DIAGNOSIS — J449 Chronic obstructive pulmonary disease, unspecified: Secondary | ICD-10-CM | POA: Diagnosis not present

## 2021-01-08 DIAGNOSIS — I10 Essential (primary) hypertension: Secondary | ICD-10-CM | POA: Diagnosis not present

## 2021-01-08 DIAGNOSIS — L89623 Pressure ulcer of left heel, stage 3: Secondary | ICD-10-CM | POA: Diagnosis not present

## 2021-01-08 DIAGNOSIS — I480 Paroxysmal atrial fibrillation: Secondary | ICD-10-CM | POA: Diagnosis not present

## 2021-01-08 NOTE — Progress Notes (Signed)
[name], [name] ([id number]) Visit Report for 01/08/2021 Abuse/Suicide Risk Screen Details Patient Name: Date of Service: [name], [name] 01/08/2021 1:15 PM Medical Record Number: [id number] Patient Account Number: [id number] Date of Birth/Sex: Treating RN: 1922/06/28 (87 y.o. [name] Primary Care Emelio Schneller: Aretta Nip Other Clinician: Referring Tyshauna Finkbiner: Treating Alayna Mabe/Extender: Otis Brace Weeks in Treatment: 0 Abuse/Suicide Risk Screen Items Answer ABUSE RISK SCREEN: Has anyone close to you tried to hurt or harm you recentlyo No Do you feel uncomfortable with anyone in your familyo No Has anyone forced you do things that you didnt want to doo No Electronic Signature(s) Signed: 01/08/2021 5:39:20 PM By: Lorrin Jackson Entered By: Lorrin Jackson on 01/08/2021 13:30:27 -------------------------------------------------------------------------------- Activities of Daily Living Details Patient Name: Date of Service: [name], [name] 01/08/2021 1:15 PM Medical Record Number: [id number] Patient Account Number: [id number] Date of Birth/Sex: Treating RN: 02/24/1923 (87 y.o. [name] Primary Care Yudit Modesitt: Aretta Nip Other Clinician: Referring Maimuna Leaman: Treating Briannah Lona/Extender: Otis Brace Weeks in Treatment: 0 Activities of Daily Living Items Answer Activities of Daily Living (Please select one for each item) Drive Automobile Not Able T Medications ake Need Assistance Use T elephone Need Assistance Care for Appearance Need Assistance Use T oilet Need Assistance Bath / Shower Need Assistance Dress Self Need Assistance Feed Self Completely Able Walk Not Able Get In / Out Bed Need Assistance Housework Not Able Prepare Meals Not Able Handle Money Need Assistance Shop for Self Not Able Electronic Signature(s) Signed: 01/08/2021 5:39:20 PM By: Lorrin Jackson Entered By: Lorrin Jackson  on 01/08/2021 13:31:11 -------------------------------------------------------------------------------- Education Screening Details Patient Name: Date of Service: [name], [name] 01/08/2021 1:15 PM Medical Record Number: [id number] Patient Account Number: [id number] Date of Birth/Sex: Treating RN: [birth year] (87 y.o. [name] Primary Care Teleah Villamar: Aretta Nip Other Clinician: Referring Ami Mally: Treating Amauris Debois/Extender: Luella Cook, Flonnie Overman in Treatment: 0 Primary Learner Assessed: Patient Learning Preferences/Education Level/Primary Language Learning Preference: Explanation, Demonstration, Printed Material Highest Education Level: College or Above Preferred Language: English Cognitive Barrier Language Barrier: No Translator Needed: No Memory Deficit: No Emotional Barrier: No Cultural/Religious Beliefs Affecting Medical Care: No Physical Barrier Impaired Vision: No Impaired Hearing: Yes Hard of Hearing Decreased Hand dexterity: Yes Limitations: arthritis Knowledge/Comprehension Knowledge Level: High Comprehension Level: High Ability to understand written instructions: High Ability to understand verbal instructions: High Motivation Anxiety Level: Calm Cooperation: Cooperative Education Importance: Acknowledges Need Interest in Health Problems: Asks Questions Perception: Coherent Willingness to Engage in Self-Management High Activities: Readiness to Engage in Self-Management High Activities: Electronic Signature(s) Signed: 01/08/2021 5:39:20 PM By: Lorrin Jackson Entered By: Lorrin Jackson on 01/08/2021 13:32:22 -------------------------------------------------------------------------------- Fall Risk Assessment Details Patient Name: Date of Service: [name], [name]. 01/08/2021 1:15 PM Medical Record Number: [id number] Patient Account Number: [id number] Date of Birth/Sex: Treating RN: [birth year] (87 y.o. [name] Primary Care Lamia Mariner: Aretta Nip Other Clinician: Referring Faithlyn Recktenwald: Treating Yasha Tibbett/Extender: Cloyd Stagers in Treatment: 0 Fall Risk Assessment Items Have you had 2 or more falls in the last 12 monthso 0 Yes Have you had any fall that resulted in injury in the last 12 monthso 0 Yes FALLS RISK SCREEN History of falling - immediate or within 3 months 25 Yes Secondary diagnosis (Do you have 2 or more medical diagnoseso) 15 Yes Ambulatory aid None/bed rest/wheelchair/nurse 0 Yes Crutches/cane/walker 0 No Furniture 0 No Intravenous therapy Access/Saline/Heparin Lock 0  No Gait/Transferring Normal/ bed rest/ wheelchair 0 Yes Weak (short steps with or without shuffle, stooped but able to lift head while walking, may seek 0 No support from furniture) Impaired (short steps with shuffle, may have difficulty arising from chair, head down, impaired 0 No balance) Mental Status Oriented to own ability 0 Yes Electronic Signature(s) Signed: 01/08/2021 5:39:20 PM By: Lorrin Jackson Entered By: Lorrin Jackson on 01/08/2021 13:33:06 -------------------------------------------------------------------------------- Foot Assessment Details Patient Name: Date of Service: [name], [place]. 01/08/2021 1:15 PM Medical Record Number: [id number] Patient Account Number: [id number] Date of Birth/Sex: Treating RN: February 28, 1923 (87 y.o. [name] Primary Care Avian Greenawalt: Aretta Nip Other Clinician: Referring Quadarius Henton: Treating Easter Schinke/Extender: Otis Brace Weeks in Treatment: 0 Foot Assessment Items Site Locations + = Sensation present, - = Sensation absent, C = Callus, U = Ulcer R = Redness, W = Warmth, M = Maceration, PU = Pre-ulcerative lesion F = Fissure, S = Swelling, D = Dryness Assessment Right: Left: Other Deformity: Yes Yes Prior Foot Ulcer: No No Prior Amputation: No No Charcot Joint: No No Ambulatory  Status: Non-ambulatory Assistance Device: Wheelchair Gait: Electronic Signature(s) Signed: 01/08/2021 5:39:20 PM By: Lorrin Jackson Entered By: Lorrin Jackson on 01/08/2021 13:51:11 -------------------------------------------------------------------------------- Nutrition Risk Screening Details Patient Name: Date of Service: [name], [name] 01/08/2021 1:15 PM Medical Record Number: [id number] Patient Account Number: [id number] Date of Birth/Sex: Treating RN: 1922-06-27 (87 y.o. [name] Primary Care Alanda Colton: Aretta Nip Other Clinician: Referring Jeree Delcid: Treating Merrit Waugh/Extender: Luella Cook, Bill [place] Weeks in Treatment: 0 Height (in): Weight (lbs): Body Mass Index (BMI): Nutrition Risk Screening Items Score Screening NUTRITION RISK SCREEN: I have an illness or condition that made me change the kind and/or amount of food I eat 0 No I eat fewer than two meals per day 0 No I eat few fruits and vegetables, or milk products 0 No I have three or more drinks of beer, liquor or wine almost every day 0 No I have tooth or mouth problems that make it hard for me to eat 0 No I don't always have enough money to buy the food I need 0 No I eat alone most of the time 0 No I take three or more different prescribed or over-the-counter drugs a day 1 Yes Without wanting to, I have lost or gained 10 pounds in the last six months 0 No I am not always physically able to shop, cook and/or feed myself 0 No Nutrition Protocols Good Risk Protocol 0 No interventions needed Moderate Risk Protocol High Risk Proctocol Risk Level: Good Risk Score: 1 Electronic Signature(s) Signed: 01/08/2021 5:39:20 PM By: Lorrin Jackson Entered By: Lorrin Jackson on 01/08/2021 13:33:28

## 2021-01-08 NOTE — Progress Notes (Signed)
[name], [name] ([id number]) Visit Report for 01/08/2021 Allergy List Details Patient Name: Date of Service: [name], [name] 01/08/2021 1:15 PM Medical Record Number: [id number] Patient Account Number: [id number] Date of Birth/Sex: Treating RN: [birth year] (87 y.o. [name] Primary Care Kaylla Cobos: Aretta Nip Other Clinician: Referring Soliyana Mcchristian: Treating Travius Crochet/Extender: Luella Cook, [place] R Weeks in Treatment: 0 Allergies Active Allergies meperidine NSAIDS (Non-Steroidal Anti-Inflammatory Drug) Opioids - Morphine Analogues tolmetin Allergy Notes Electronic Signature(s) Signed: 01/08/2021 5:39:20 PM By: Lorrin Jackson Entered By: Lorrin Jackson on 01/08/2021 13:23:31 -------------------------------------------------------------------------------- Arrival Information Details Patient Name: Date of Service: [name], [place] 01/08/2021 1:15 PM Medical Record Number: [id number] Patient Account Number: [id number] Date of Birth/Sex: Treating RN: Jul 13, 1922 (87 y.o. [name] Primary Care Dannon Nguyenthi: Aretta Nip Other Clinician: Referring Letecia Arps: Treating Adley Mazurowski/Extender: Luella Cook, Flonnie Overman in Treatment: 0 Visit Information Patient Arrived: Wheel Chair Arrival Time: 13:18 Accompanied By: Denman George Transfer Assistance: Harrel Lemon Lift Patient Identification Verified: Yes Secondary Verification Process Completed: Yes Patient Requires Transmission-Based Precautions: No Patient Has Alerts: Yes Patient Alerts: Bilat ABI's NonComp BP left arm ONLY Electronic Signature(s) Signed: 01/08/2021 5:39:20 PM By: Lorrin Jackson Entered By: Lorrin Jackson on 01/08/2021 14:10:21 -------------------------------------------------------------------------------- Clinic Level of Care Assessment Details Patient Name: Date of Service: [name], [name] 01/08/2021 1:15 PM Medical Record Number: [id number] Patient Account  Number: [id number] Date of Birth/Sex: Treating RN: 1922/04/29 (87 y.o. [name] Primary Care Orlandus Borowski: Aretta Nip Other Clinician: Referring Cher Franzoni: Treating Tamorah Hada/Extender: Otis Brace Weeks in Treatment: 0 Clinic Level of Care Assessment Items TOOL 1 Quantity Score X- 1 0 Use when EandM and Procedure is performed on INITIAL visit ASSESSMENTS - Nursing Assessment / Reassessment X- 1 20 General Physical Exam (combine w/ comprehensive assessment (listed just below) when performed on new pt. evals) X- 1 25 Comprehensive Assessment (HX, ROS, Risk Assessments, Wounds Hx, etc.) ASSESSMENTS - Wound and Skin Assessment / Reassessment []  - 0 Dermatologic / Skin Assessment (not related to wound area) ASSESSMENTS - Ostomy and/or Continence Assessment and Care []  - 0 Incontinence Assessment and Management []  - 0 Ostomy Care Assessment and Management (repouching, etc.) PROCESS - Coordination of Care []  - 0 Simple Patient / Family Education for ongoing care X- 1 20 Complex (extensive) Patient / Family Education for ongoing care X- 1 10 Staff obtains Programmer, systems, Records, T Results / Process Orders est X- 1 10 Staff telephones HHA, Nursing Homes / Clarify orders / etc []  - 0 Routine Transfer to another Facility (non-emergent condition) []  - 0 Routine Hospital Admission (non-emergent condition) []  - 0 New Admissions / Biomedical engineer / Ordering NPWT Apligraf, etc. , []  - 0 Emergency Hospital Admission (emergent condition) PROCESS - Special Needs []  - 0 Pediatric / Minor Patient Management []  - 0 Isolation Patient Management []  - 0 Hearing / Language / Visual special needs []  - 0 Assessment of Community assistance (transportation, D/C planning, etc.) []  - 0 Additional assistance / Altered mentation X- 1 15 Support Surface(s) Assessment (bed, cushion, seat, etc.) INTERVENTIONS - Miscellaneous []  - 0 External ear exam []  -  0 Patient Transfer (multiple staff / Civil Service fast streamer / Similar devices) []  - 0 Simple Staple / Suture removal (25 or less) []  - 0 Complex Staple / Suture removal (26 or more) []  - 0 Hypo/Hyperglycemic Management (do not check if billed separately) X- 1 15 Ankle / Brachial Index (ABI) - do not check if billed separately Has  the patient been seen at the hospital within the last three years: Yes Total Score: 115 Level Of Care: New/Established - Level 3 Electronic Signature(s) Signed: 01/08/2021 5:39:20 PM By: Lorrin Jackson Signed: 01/08/2021 5:39:20 PM By: Lorrin Jackson Entered By: Lorrin Jackson on 01/08/2021 15:24:28 -------------------------------------------------------------------------------- Encounter Discharge Information Details Patient Name: Date of Service: [name], [place] 01/08/2021 1:15 PM Medical Record Number: [id number] Patient Account Number: [id number] Date of Birth/Sex: Treating RN: 01/08/1923 (87 y.o. [name] Primary Care Rashawn Rayman: Aretta Nip Other Clinician: Referring Chares Slaymaker: Treating Auron Tadros/Extender: Otis Brace Weeks in Treatment: 0 Encounter Discharge Information Items Post Procedure Vitals Discharge Condition: Stable Temperature (F): 98 Ambulatory Status: Wheelchair Pulse (bpm): 61 Discharge Destination: [place] Respiratory Rate (breaths/min): 18 Orders Sent: Yes Blood Pressure (mmHg): 165/65 Transportation: Other Accompanied By: Denman George Schedule Follow-up Appointment: Yes Clinical Summary of Care: Provided on 01/08/2021 Form Type Recipient Paper Patient Patient Electronic Signature(s) Signed: 01/08/2021 5:39:20 PM By: Lorrin Jackson Entered By: Lorrin Jackson on 01/08/2021 15:26:16 -------------------------------------------------------------------------------- Lower Extremity Assessment Details Patient Name: Date of Service: [name], [name] 01/08/2021 1:15 PM Medical Record  Number: [id number] Patient Account Number: [id number] Date of Birth/Sex: Treating RN: 12/22/1922 (87 y.o. [name] Primary Care Kirubel Aja: Aretta Nip Other Clinician: Referring Keeara Frees: Treating Thadeus Gandolfi/Extender: Luella Cook, [place] Weeks in Treatment: 0 Edema Assessment Assessed: [Left: Yes] [Right: Yes] Edema: [Left: No] [Right: No] Calf Left: Right: Point of Measurement: 31 cm From Medial Instep 35 cm 36 cm Ankle Left: Right: Point of Measurement: 9 cm From Medial Instep 23 cm 24.5 cm Vascular Assessment Pulses: Dorsalis Pedis Palpable: [Left:No] [Right:Yes] Doppler Audible: [Left:Yes] [Right:Yes] Blood Pressure: Brachial: [Left:165] [Right:165] Notes Bilateral ABI's Non Compressible Electronic Signature(s) Signed: 01/08/2021 5:39:20 PM By: Lorrin Jackson Entered By: Lorrin Jackson on 01/08/2021 14:07:53 -------------------------------------------------------------------------------- Multi-Disciplinary Care Plan Details Patient Name: Date of Service: [name], [name] Sarna R. 01/08/2021 1:15 PM Medical Record Number: [id number] Patient Account Number: [id number] Date of Birth/Sex: Treating RN: Nov 14, 1922 (87 y.o. [name] Primary Care [place] Spainhower: Aretta Nip Other Clinician: Referring Shaundra Fullam: Treating Rushi Chasen/Extender: Luella Cook, Flonnie Overman in Treatment: 0 Active Inactive Abuse / Safety / Falls / Self Care Management Nursing Diagnoses: History of Falls Goals: Patient will remain injury free related to falls Date Initiated: 01/08/2021 Target Resolution Date: 02/05/2021 Goal Status: Active Interventions: Assess Activities of Daily Living upon admission and as needed Assess fall risk on admission and as needed Provide education on fall prevention Notes: Pressure Nursing Diagnoses: Knowledge deficit related to management of pressures ulcers Goals: Patient will remain free of pressure  ulcers Date Initiated: 01/08/2021 Target Resolution Date: 02/05/2021 Goal Status: Active Interventions: Assess offloading mechanisms upon admission and as needed Provide education on pressure ulcers Notes: Wound/Skin Impairment Nursing Diagnoses: Impaired tissue integrity Goals: Patient/caregiver will verbalize understanding of skin care regimen Date Initiated: 01/08/2021 Target Resolution Date: 02/05/2021 Goal Status: Active Ulcer/skin breakdown will have a volume reduction of 30% by week 4 Date Initiated: 01/08/2021 Target Resolution Date: 02/05/2021 Goal Status: Active Interventions: Assess patient/caregiver ability to obtain necessary supplies Assess patient/caregiver ability to perform ulcer/skin care regimen upon admission and as needed Assess ulceration(s) every visit Provide education on ulcer and skin care Treatment Activities: Topical wound management initiated : 01/08/2021 Notes: Electronic Signature(s) Signed: 01/08/2021 5:39:20 PM By: Lorrin Jackson Entered By: Lorrin Jackson on 01/08/2021 14:36:52 -------------------------------------------------------------------------------- Pain Assessment Details Patient Name: Date of Service: [name], [place]. 01/08/2021 1:15 PM  Medical Record Number: [id number] Patient Account Number: [id number] Date of Birth/Sex: Treating RN: 1923-02-24 (87 y.o. [name] Primary Care Tranae Laramie: Aretta Nip Other Clinician: Referring Kesleigh Morson: Treating Shaylen Nephew/Extender: Otis Brace Weeks in Treatment: 0 Active Problems Location of Pain Severity and Description of Pain Patient Has Paino No Site Locations Pain Management and Medication Current Pain Management: Electronic Signature(s) Signed: 01/08/2021 5:39:20 PM By: Lorrin Jackson Entered By: Lorrin Jackson on 01/08/2021 13:34:16 -------------------------------------------------------------------------------- Patient/Caregiver Education  Details Patient Name: Date of Service: [name] 11/9/2022andnbsp1:15 PM Medical Record Number: [id number] Patient Account Number: [id number] Date of Birth/Gender: Treating RN: 1922-12-26 (87 y.o. [name] Primary Care Physician: Aretta Nip Other Clinician: Referring Physician: Treating Physician/Extender: Luella Cook, Flonnie Overman in Treatment: 0 Education Assessment Education Provided To: Patient Education Topics Provided Pressure: Methods: Explain/Verbal, Printed Responses: State content correctly Safety: Methods: Explain/Verbal Responses: State content correctly Wound/Skin Impairment: Methods: Explain/Verbal, Printed Responses: State content correctly Electronic Signature(s) Signed: 01/08/2021 5:39:20 PM By: Lorrin Jackson Entered By: Lorrin Jackson on 01/08/2021 14:36:14 -------------------------------------------------------------------------------- Wound Assessment Details Patient Name: Date of Service: [name], [name] Sarna R. 01/08/2021 1:15 PM Medical Record Number: [id number] Patient Account Number: [id number] Date of Birth/Sex: Treating RN: 1922-03-24 (87 y.o. [name] Primary Care Benedetto Ryder: Aretta Nip Other Clinician: Referring Tracey Stewart: Treating Krystofer Hevener/Extender: Luella Cook, [place] Weeks in Treatment: 0 Wound Status Wound Number: 1 Primary Pressure Ulcer Etiology: Wound Location: Left Calcaneus Wound Status: Open Wounding Event: Pressure Injury Comorbid Cataracts, Arrhythmia, Hypertension, Osteoarthritis, Date Acquired: 09/26/2020 History: Received Radiation Weeks Of Treatment: 0 Clustered Wound: No Photos Wound Measurements Length: (cm) 2 Width: (cm) 2.4 Depth: (cm) 0.1 Area: (cm) 3.77 Volume: (cm) 0.377 % Reduction in Area: 0% % Reduction in Volume: 0% Epithelialization: None Tunneling: No Undermining: No Wound Description Classification:  Unstageable/Unclassified Wound Margin: Distinct, outline attached Exudate Amount: Medium Exudate Type: Serosanguineous Exudate Color: red, brown Foul Odor After Cleansing: No Slough/Fibrino Yes Wound Bed Granulation Amount: Small (1-33%) Exposed Structure Granulation Quality: Red Fascia Exposed: No Necrotic Amount: Large (67-100%) Fat Layer (Subcutaneous Tissue) Exposed: Yes Necrotic Quality: Eschar, Adherent Slough Tendon Exposed: No Muscle Exposed: No Joint Exposed: No Bone Exposed: No Treatment Notes Wound #1 (Calcaneus) Wound Laterality: Left Cleanser Soap and Water Discharge Instruction: May shower and wash wound with dial antibacterial soap and water prior to dressing change. Wound Cleanser Discharge Instruction: Cleanse the wound with wound cleanser prior to applying a clean dressing using gauze sponges, not tissue or cotton balls. Peri-Wound Care Topical Primary Dressing Iodosorb Gel 10 (gm) Tube Discharge Instruction: Apply to wound bed as instructed Secondary Dressing Woven Gauze Sponges 2x2 in Discharge Instruction: Apply over primary dressing as directed. Zetuvit Plus Silicone Border Dressing 4x4 (in/in) Discharge Instruction: Or equivalent silicone foam border Secured With Compression Wrap Compression Stockings Add-Ons Electronic Signature(s) Signed: 01/08/2021 5:39:20 PM By: Lorrin Jackson Entered By: Lorrin Jackson on 01/08/2021 14:06:13 -------------------------------------------------------------------------------- [place] Details Patient Name: Date of Service: [name], [place] 01/08/2021 1:15 PM Medical Record Number: [id number] Patient Account Number: [id number] Date of Birth/Sex: Treating RN: 1922-04-13 (87 y.o. [name] Primary Care Amberly Livas: Aretta Nip Other Clinician: Referring Dearion Huot: Treating Sharla Tankard/Extender: Luella Cook, [place] Weeks in Treatment: 0 Vital Signs Time Taken: 13:22 Temperature  (F): 98 Pulse (bpm): 61 Respiratory Rate (breaths/min): 18 Blood Pressure (mmHg): 165/65 Reference Range: 80 - 120 mg / dl Electronic Signature(s) Signed: 01/08/2021 5:39:20 PM By: Fara Chute  By: Lorrin Jackson on 01/08/2021 13:22:45

## 2021-01-13 DIAGNOSIS — I739 Peripheral vascular disease, unspecified: Secondary | ICD-10-CM | POA: Diagnosis not present

## 2021-01-15 ENCOUNTER — Other Ambulatory Visit: Payer: Self-pay

## 2021-01-15 ENCOUNTER — Encounter (HOSPITAL_BASED_OUTPATIENT_CLINIC_OR_DEPARTMENT_OTHER): Payer: Medicare PPO | Admitting: Physician Assistant

## 2021-01-15 DIAGNOSIS — L97422 Non-pressure chronic ulcer of left heel and midfoot with fat layer exposed: Secondary | ICD-10-CM | POA: Diagnosis not present

## 2021-01-15 DIAGNOSIS — M6281 Muscle weakness (generalized): Secondary | ICD-10-CM | POA: Diagnosis not present

## 2021-01-15 DIAGNOSIS — M1990 Unspecified osteoarthritis, unspecified site: Secondary | ICD-10-CM | POA: Diagnosis not present

## 2021-01-15 DIAGNOSIS — I10 Essential (primary) hypertension: Secondary | ICD-10-CM | POA: Diagnosis not present

## 2021-01-15 DIAGNOSIS — L89620 Pressure ulcer of left heel, unstageable: Secondary | ICD-10-CM | POA: Diagnosis not present

## 2021-01-15 NOTE — Progress Notes (Addendum)
[name], [name] ([id number]) Visit Report for 01/15/2021 Chief Complaint Document Details Patient Name: Date of Service: [name], [name] 01/15/2021 12:45 PM Medical Record Number: [id number] Patient Account Number: [id number] Date of Birth/Sex: Treating RN: 11/05/1922 (87 y.o. [name] Primary Care Provider: Aretta Nip Other Clinician: Referring Provider: Treating Provider/Extender: Luella Cook, Flonnie Overman in Treatment: 1 Information Obtained from: Patient Chief Complaint Left heel ulcer Electronic Signature(s) Signed: 01/15/2021 1:21:24 PM By: Worthy Keeler PA-C Entered By: Worthy Keeler on 01/15/2021 13:21:24 -------------------------------------------------------------------------------- HPI Details Patient Name: Date of Service: [name], [name] 01/15/2021 12:45 PM Medical Record Number: [id number] Patient Account Number: [id number] Date of Birth/Sex: Treating RN: 03-Aug-1922 (87 y.o. [name] Primary Care Provider: Aretta Nip Other Clinician: Referring Provider: Treating Provider/Extender: Cloyd Stagers in Treatment: 1 History of Present Illness HPI Description: 01/08/2021 patient presents today for evaluation of a pressure ulcer which began after she fell in July and broke her arm. This unfortunately has led to her being overall more generally weak and she is not really up and moving around much at all. While she was in the hospital she developed the heel ulcers bilaterally the right was more deep tissue injury that pretty much appears to be healed at this point. At the current skilled nursing facility they have actually been doing a great job taking care of her according to her friend who also helps take care of her. Subsequently the patient's wound on the left heel is eschar covered and probably would benefit from clearing away this eschar so that she can actually see some  improvements hopefully. Currently they have been utilizing Iodosorb along with a border foam dressing. Patient has a history of hypertension, COPD, generalized muscle weakness, and atrial fibrillation. Due to her age they elected not to place her on any blood thinners for the atrial fibrillation. 01/15/2021 upon evaluation today patient appears to be doing better in regard to the heel ulcer. I do not have initially upon evaluation today her arterial studies that were done at the facility. Therefore I did not perform any sharp debridement while she was present during the office visit today. Nonetheless I am going to suggest based on what was seen currently that we probably once we get this with you at everything appears to be okay consider stop debridement at the next visit. Electronic Signature(s) Signed: 01/15/2021 2:49:23 PM By: Worthy Keeler PA-C Entered By: Worthy Keeler on 01/15/2021 14:49:23 -------------------------------------------------------------------------------- Physical Exam Details Patient Name: Date of Service: [name], [name] 01/15/2021 12:45 PM Medical Record Number: [id number] Patient Account Number: [id number] Date of Birth/Sex: Treating RN: Aug 01, 1922 (87 y.o. [name] Primary Care Provider: Aretta Nip Other Clinician: Referring Provider: Treating Provider/Extender: Otis Brace Weeks in Treatment: 1 Constitutional Well-nourished and well-hydrated in no acute distress. Respiratory normal breathing without difficulty. Psychiatric this patient is able to make decisions and demonstrates good insight into disease process. Alert and Oriented x 3. pleasant and cooperative. Notes Upon inspection patient has some slough and necrotic tissue still in the base of the wound but again I am not performing any more aggressive sharp debridement at this point to have confirmation of good arterial flow. We will continue with the  Iodosorb at this point. Electronic Signature(s) Signed: 01/15/2021 2:50:01 PM By: Worthy Keeler PA-C Entered By: Worthy Keeler on 01/15/2021 14:50:01 -------------------------------------------------------------------------------- Physician Orders Details Patient Name: Date  of Service: [name], [name] 01/15/2021 12:45 PM Medical Record Number: [id number] Patient Account Number: [id number] Date of Birth/Sex: Treating RN: [birth year] (87 y.o. [name] Primary Care Provider: Milagros Evener R Other Clinician: Referring Provider: Treating Provider/Extender: Cloyd Stagers in Treatment: 1 Verbal / Phone Orders: No Diagnosis Coding ICD-10 Coding Code Description [phone] Pressure ulcer of left heel, unstageable I10 Essential (primary) hypertension J44.9 Chronic obstructive pulmonary disease, unspecified M62.81 Muscle weakness (generalized) I48.0 Paroxysmal atrial fibrillation Follow-up Appointments ppointment in 2 weeks. - with Margarita Grizzle Return A Bathing/ Shower/ Hygiene May shower and wash wound with soap and water. - with dressing change Off-Loading Other: - Continue to use Bunny Boots/Float Heels especially while in bed Additional Orders / Instructions Follow Nutritious Diet - Continue Prostat Non Wound Condition Protect area with: - Continue foam border to right heel for protection. Wound Treatment Wound #1 - Calcaneus Wound Laterality: Left Cleanser: Soap and Water Every Other Day/30 Days Discharge Instructions: May shower and wash wound with dial antibacterial soap and water prior to dressing change. Cleanser: Wound Cleanser Every Other Day/30 Days Discharge Instructions: Cleanse the wound with wound cleanser prior to applying a clean dressing using gauze sponges, not tissue or cotton balls. Prim Dressing: Iodosorb Gel 10 (gm) Tube Every Other Day/30 Days ary Discharge Instructions: Apply to wound bed as instructed Secondary  Dressing: Woven Gauze Sponges 2x2 in Every Other Day/30 Days Discharge Instructions: Apply over primary dressing as directed. Secondary Dressing: Zetuvit Plus Silicone Border Dressing 4x4 (in/in) Every Other Day/30 Days Discharge Instructions: Or equivalent silicone foam border Consults Vascular - VVS for critical limb ischemia with unstageable pressure ulcer to left heel, abnormal ABIs done at [facility] - (ICD10 L89.620 - Pressure ulcer of left heel, unstageable) Electronic Signature(s) Signed: 01/15/2021 4:59:49 PM By: Worthy Keeler PA-C Signed: 01/15/2021 5:55:37 PM By: Baruch Gouty RN, BSN Entered By: Baruch Gouty on 01/15/2021 16:18:22 Prescription 01/15/2021 -------------------------------------------------------------------------------- Everlean Patterson PA Patient Name: Provider: 1923-01-28 [id number] Date of Birth: NPI#Rickey Primus Sex: DEA #: [phone] Phone #: License #: [place] Patient Address: [address], [place] [address] [place], [place] [address] [phone] Allergies meperidine; NSAIDS (Non-Steroidal Anti-Inflammatory Drug); Opioids - Morphine Analogues; tolmetin Provider's Orders Vascular - ICD10: J85.631 - VVS for critical limb ischemia with unstageable pressure ulcer to left heel, abnormal ABIs done at [facility] Hand Signature: Date(s): Electronic Signature(s) Signed: 01/15/2021 4:59:49 PM By: Worthy Keeler PA-C Signed: 01/15/2021 5:55:37 PM By: Baruch Gouty RN, BSN Entered By: Baruch Gouty on 01/15/2021 16:18:22 -------------------------------------------------------------------------------- Problem List Details Patient Name: Date of Service: [name], [name] 01/15/2021 12:45 PM Medical Record Number: [id number] Patient Account Number: [id number] Date of Birth/Sex: Treating RN: 03-07-1922 (87 y.o. [name] Primary Care Provider: Aretta Nip Other Clinician: Referring Provider: Treating Provider/Extender: Otis Brace Weeks in Treatment: 1 Active Problems ICD-10 Encounter Code Description Active Date MDM Diagnosis L89.620 Pressure ulcer of left heel, unstageable 01/08/2021 No Yes I10 Essential (primary) hypertension 01/08/2021 No Yes J44.9 Chronic obstructive pulmonary disease, unspecified 01/08/2021 No Yes M62.81 Muscle weakness (generalized) 01/08/2021 No Yes I48.0 Paroxysmal atrial fibrillation 01/08/2021 No Yes Inactive Problems Resolved Problems Electronic Signature(s) Signed: 01/15/2021 1:21:04 PM By: Worthy Keeler PA-C Entered By: Worthy Keeler on 01/15/2021 13:21:04 -------------------------------------------------------------------------------- Progress Note Details Patient Name: Date of Service: [name], [place]. 01/15/2021 12:45  PM Medical Record Number: [id number] Patient Account Number: [id number] Date of Birth/Sex: Treating RN: Jul 19, 1922 (87 y.o. [name] Primary Care Provider: Aretta Nip Other Clinician: Referring Provider: Treating Provider/Extender: Cloyd Stagers in Treatment: 1 Subjective Chief Complaint Information obtained from Patient Left heel ulcer History of Present Illness (HPI) 01/08/2021 patient presents today for evaluation of a pressure ulcer which began after she fell in July and broke her arm. This unfortunately has led to her being overall more generally weak and she is not really up and moving around much at all. While she was in the hospital she developed the heel ulcers bilaterally the right was more deep tissue injury that pretty much appears to be healed at this point. At the current skilled nursing facility they have actually been doing a great job taking care of her according to her friend who also helps take care of her. Subsequently the patient's wound on  the left heel is eschar covered and probably would benefit from clearing away this eschar so that she can actually see some improvements hopefully. Currently they have been utilizing Iodosorb along with a border foam dressing. Patient has a history of hypertension, COPD, generalized muscle weakness, and atrial fibrillation. Due to her age they elected not to place her on any blood thinners for the atrial fibrillation. 01/15/2021 upon evaluation today patient appears to be doing better in regard to the heel ulcer. I do not have initially upon evaluation today her arterial studies that were done at the facility. Therefore I did not perform any sharp debridement while she was present during the office visit today. Nonetheless I am going to suggest based on what was seen currently that we probably once we get this with you at everything appears to be okay consider stop debridement at the next visit. Objective Constitutional Well-nourished and well-hydrated in no acute distress. Vitals Time Taken: 12:58 PM, Temperature: 97.5 F, Pulse: 60 bpm, Respiratory Rate: 18 breaths/min, Blood Pressure: 149/72 mmHg. Respiratory normal breathing without difficulty. Psychiatric this patient is able to make decisions and demonstrates good insight into disease process. Alert and Oriented x 3. pleasant and cooperative. General Notes: Upon inspection patient has some slough and necrotic tissue still in the base of the wound but again I am not performing any more aggressive sharp debridement at this point to have confirmation of good arterial flow. We will continue with the Iodosorb at this point. Integumentary (Hair, Skin) Wound #1 status is Open. Original cause of wound was Pressure Injury. The date acquired was: 09/26/2020. The wound has been in treatment 1 weeks. The wound is located on the Left Calcaneus. The wound measures 1.4cm length x 2cm width x 0.4cm depth; 2.199cm^2 area and 0.88cm^3 volume. There is  Fat Layer (Subcutaneous Tissue) exposed. There is no tunneling noted, however, there is undermining starting at 9:00 and ending at 1:00 with a maximum distance of 0.3cm. There is a medium amount of serosanguineous drainage noted. The wound margin is well defined and not attached to the wound base. There is small (1-33%) red granulation within the wound bed. There is a large (67-100%) amount of necrotic tissue within the wound bed including Adherent Slough. Assessment Active Problems ICD-10 Pressure ulcer of left heel, unstageable Essential (primary) hypertension Chronic obstructive pulmonary disease, unspecified Muscle weakness (generalized) Paroxysmal atrial fibrillation Plan Follow-up Appointments: Return Appointment in 2 weeks. - with Glynn Octave Shower/ Hygiene: May shower and wash wound with soap and water. - with dressing  change Off-Loading: Other: - Continue to use Bunny Boots/Float Heels especially while in bed Additional Orders / Instructions: Follow Nutritious Diet - Continue Prostat Non Wound Condition: Protect area with: - Continue foam border to right heel for protection. WOUND #1: - Calcaneus Wound Laterality: Left Cleanser: Soap and Water Every Other Day/30 Days Discharge Instructions: May shower and wash wound with dial antibacterial soap and water prior to dressing change. Cleanser: Wound Cleanser Every Other Day/30 Days Discharge Instructions: Cleanse the wound with wound cleanser prior to applying a clean dressing using gauze sponges, not tissue or cotton balls. Prim Dressing: Iodosorb Gel 10 (gm) Tube Every Other Day/30 Days ary Discharge Instructions: Apply to wound bed as instructed Secondary Dressing: Woven Gauze Sponges 2x2 in Every Other Day/30 Days Discharge Instructions: Apply over primary dressing as directed. Secondary Dressing: Zetuvit Plus Silicone Border Dressing 4x4 (in/in) Every Other Day/30 Days Discharge Instructions: Or equivalent silicone foam  border 1. Would recommend currently that we going to continue with the wound care measures as before and the patient is in agreement with the plan. This includes the use of the Iodosorb followed by a border foam dressing to be changed 3 times per week. 2. I am also can recommend that we have the patient continue with the offloading she does have good Prevalon type offloading boots which is also on. 3. I am also going to recommend patient continue to monitor for any signs of worsening or infection obviously if she has any increased pain she should let me know. We will see patient back for reevaluation in 1 week here in the clinic. If anything worsens or changes patient will contact our office for additional recommendations. Electronic Signature(s) Signed: 01/15/2021 2:50:56 PM By: Worthy Keeler PA-C Entered By: Worthy Keeler on 01/15/2021 14:50:56 -------------------------------------------------------------------------------- SuperBill Details Patient Name: Date of Service: [name], [name] Sarna R. 01/15/2021 Medical Record Number: [id number] Patient Account Number: [id number] Date of Birth/Sex: Treating RN: 11-Apr-1922 (87 y.o. [name] Primary Care Provider: Aretta Nip Other Clinician: Referring Provider: Treating Provider/Extender: Otis Brace Weeks in Treatment: 1 Diagnosis Coding ICD-10 Codes Code Description [phone] Pressure ulcer of left heel, unstageable I10 Essential (primary) hypertension J44.9 Chronic obstructive pulmonary disease, unspecified M62.81 Muscle weakness (generalized) I48.0 Paroxysmal atrial fibrillation Facility Procedures CPT4 Code: [id number] Description: 99213 - WOUND CARE VISIT-LEV 3 EST PT Modifier: Quantity: 1 Physician Procedures : CPT4 Code Description Modifier [id number] - WC PHYS LEVEL 4 - EST PT ICD-10 Diagnosis Description L89.620 Pressure ulcer of left heel, unstageable I10 Essential (primary)  hypertension J44.9 Chronic obstructive pulmonary disease, unspecified M62.81  Muscle weakness (generalized) Quantity: 1 Electronic Signature(s) Signed: 01/15/2021 2:51:14 PM By: Worthy Keeler PA-C Entered By: Worthy Keeler on 01/15/2021 14:51:14

## 2021-01-15 NOTE — Progress Notes (Signed)
[name], [name] ([id number]) Visit Report for 01/15/2021 Arrival Information [name] Patient Name: Date of Service: [name] [name], [name] [name] 01/15/2021 12:45 PM Medical Record Number: [id number] Patient Account Number: [id number] Date of Birth/Sex: Treating RN: 11-Mar-1922 (87 y.o. [name] [name] Primary Care [name] [name]: [name] [name] Other Clinician: Referring [name] [name]: Treating [name] [name]/Extender: [name] [name] in Treatment: 1 Visit Information History Since Last Visit Added or deleted any medications: No Patient Arrived: Wheel Chair Any new allergies or adverse reactions: No Arrival Time: 12:58 Had a fall or experienced change in No Accompanied By: friend activities of daily living that may affect Transfer Assistance: [name] [name] Lift risk of falls: Patient Identification Verified: Yes Signs or symptoms of abuse/neglect since last visito No Secondary Verification Process Completed: Yes Hospitalized since last visit: No Patient Requires Transmission-Based Precautions: No Implantable device outside of the clinic excluding No Patient Has Alerts: Yes cellular tissue based products placed in the center Patient Alerts: Bilat ABI's NonComp since last visit: BP left arm ONLY Has Dressing in Place as Prescribed: Yes Pain Present Now: No Electronic Signature(s) Signed: 01/15/2021 5:55:37 PM By: [name] Gouty RN, BSN Entered By: [name] [name] on 01/15/2021 12:58:27 -------------------------------------------------------------------------------- Clinic Level of Care Assessment [name] Patient Name: Date of Service: [name] [name], [name] [name]. 01/15/2021 12:45 PM Medical Record Number: [id number] Patient Account Number: [id number] Date of Birth/Sex: Treating RN: 1922-06-25 (87 y.o. [name] [name] Primary Care Ludell Zacarias: [name] [name] Other Clinician: Referring [name] [name]: Treating [name] [name]/Extender: [name] [name] in  Treatment: 1 Clinic Level of Care Assessment Items TOOL 4 Quantity Score []  - 0 Use when only an EandM is performed on FOLLOW-UP visit ASSESSMENTS - Nursing Assessment / Reassessment X- 1 10 Reassessment of Co-morbidities (includes updates in patient status) X- 1 5 Reassessment of Adherence to Treatment Plan ASSESSMENTS - Wound and Skin A ssessment / Reassessment X - Simple Wound Assessment / Reassessment - one wound 1 5 []  - 0 Complex Wound Assessment / Reassessment - multiple wounds []  - 0 Dermatologic / Skin Assessment (not related to wound area) ASSESSMENTS - Focused Assessment []  - 0 Circumferential Edema Measurements - multi extremities []  - 0 Nutritional Assessment / Counseling / Intervention X- 1 5 Lower Extremity Assessment (monofilament, tuning fork, pulses) []  - 0 Peripheral Arterial Disease Assessment (using hand held doppler) ASSESSMENTS - Ostomy and/or Continence Assessment and Care []  - 0 Incontinence Assessment and Management []  - 0 Ostomy Care Assessment and Management (repouching, etc.) PROCESS - Coordination of Care X - Simple Patient / Family Education for ongoing care 1 15 []  - 0 Complex (extensive) Patient / Family Education for ongoing care X- 1 10 Staff obtains Programmer, systems, Records, T Results / Process Orders est X- 1 10 Staff telephones HHA, Nursing Homes / Clarify orders / etc []  - 0 Routine Transfer to another Facility (non-emergent condition) []  - 0 Routine Hospital Admission (non-emergent condition) []  - 0 New Admissions / Biomedical engineer / Ordering NPWT Apligraf, etc. , []  - 0 Emergency Hospital Admission (emergent condition) X- 1 10 Simple Discharge Coordination []  - 0 Complex (extensive) Discharge Coordination PROCESS - Special Needs []  - 0 Pediatric / Minor Patient Management []  - 0 Isolation Patient Management []  - 0 Hearing / Language / Visual special needs []  - 0 Assessment of Community assistance (transportation,  D/C planning, etc.) []  - 0 Additional assistance / Altered mentation []  - 0 Support Surface(s) Assessment (bed, cushion, seat, etc.) INTERVENTIONS - Wound Cleansing / Measurement X -  Simple Wound Cleansing - one wound 1 5 []  - 0 Complex Wound Cleansing - multiple wounds X- 1 5 Wound Imaging (photographs - any number of wounds) []  - 0 Wound Tracing (instead of photographs) X- 1 5 Simple Wound Measurement - one wound []  - 0 Complex Wound Measurement - multiple wounds INTERVENTIONS - Wound Dressings X - Small Wound Dressing one or multiple wounds 1 10 []  - 0 Medium Wound Dressing one or multiple wounds []  - 0 Large Wound Dressing one or multiple wounds X- 1 5 Application of Medications - topical []  - 0 Application of Medications - injection INTERVENTIONS - Miscellaneous []  - 0 External ear exam []  - 0 Specimen Collection (cultures, biopsies, blood, body fluids, etc.) []  - 0 Specimen(s) / Culture(s) sent or taken to Lab for analysis []  - 0 Patient Transfer (multiple staff / Civil Service fast streamer / Similar devices) []  - 0 Simple Staple / Suture removal (25 or less) []  - 0 Complex Staple / Suture removal (26 or more) []  - 0 Hypo / Hyperglycemic Management (close monitor of Blood Glucose) []  - 0 Ankle / Brachial Index (ABI) - do not check if billed separately X- 1 5 Vital Signs Has the patient been seen at the hospital within the last three years: Yes Total Score: 105 Level Of Care: New/Established - Level 3 Electronic Signature(s) Signed: 01/15/2021 5:55:37 PM By: [name] Gouty RN, BSN Entered By: [name] [name] on 01/15/2021 13:26:09 -------------------------------------------------------------------------------- Lower Extremity Assessment [name] Patient Name: Date of Service: [name] [name], [name] Sarna R. 01/15/2021 12:45 PM Medical Record Number: [id number] Patient Account Number: [id number] Date of Birth/Sex: Treating RN: [birth year] (87 y.o. [name] [name] Primary Care  [name] [name]: [name] [name] Other Clinician: Referring [name] [name]: Treating [name] [name]/Extender: [name] [name] in Treatment: 1 Edema Assessment Assessed: [Left: No] [Right: No] Edema: [Left: N] [Right: o] Calf Left: Right: Point of Measurement: 31 cm From Medial Instep 35 cm Ankle Left: Right: Point of Measurement: 9 cm From Medial Instep 23 cm Vascular Assessment Pulses: Dorsalis Pedis Palpable: [Left:Yes] Electronic Signature(s) Signed: 01/15/2021 5:55:37 PM By: [name] Gouty RN, BSN Entered By: [name] [name] on 01/15/2021 13:04:57 -------------------------------------------------------------------------------- Multi-Disciplinary Care Plan [name] Patient Name: Date of Service: [name] [name], [place] 01/15/2021 12:45 PM Medical Record Number: [id number] Patient Account Number: [id number] Date of Birth/Sex: Treating RN: 02-11-1923 (87 y.o. [name] [name] Primary Care Mickel Schreur: [name] [name] Other Clinician: Referring Belem Hintze: Treating Lovina Zuver/Extender: Luella Cook, Flonnie Overman in Treatment: 1 Multidisciplinary Care Plan reviewed with physician Active Inactive Abuse / Safety / Falls / Self Care Management Nursing Diagnoses: History of Falls Goals: Patient will remain injury free related to falls Date Initiated: 01/08/2021 Target Resolution Date: 02/05/2021 Goal Status: Active Interventions: Assess Activities of Daily Living upon admission and as needed Assess fall risk on admission and as needed Provide education on fall prevention Notes: Pressure Nursing Diagnoses: Knowledge deficit related to management of pressures ulcers Goals: Patient will remain free of pressure ulcers Date Initiated: 01/08/2021 Target Resolution Date: 02/05/2021 Goal Status: Active Interventions: Assess offloading mechanisms upon admission and as needed Provide education on pressure ulcers Notes: Wound/Skin Impairment Nursing  Diagnoses: Impaired tissue integrity Goals: Patient/caregiver will verbalize understanding of skin care regimen Date Initiated: 01/08/2021 Target Resolution Date: 02/05/2021 Goal Status: Active Ulcer/skin breakdown will have a volume reduction of 30% by week 4 Date Initiated: 01/08/2021 Target Resolution Date: 02/05/2021 Goal Status: Active Interventions: Assess patient/caregiver ability to obtain necessary supplies Assess patient/caregiver ability to perform ulcer/skin  care regimen upon admission and as needed Assess ulceration(s) every visit Provide education on ulcer and skin care Treatment Activities: Topical wound management initiated : 01/08/2021 Notes: Electronic Signature(s) Signed: 01/15/2021 5:55:37 PM By: [name] Gouty RN, BSN Entered By: [name] [name] on 01/15/2021 13:06:26 -------------------------------------------------------------------------------- Pain Assessment [name] Patient Name: Date of Service: [name] [name], [name] [name] 01/15/2021 12:45 PM Medical Record Number: [id number] Patient Account Number: [id number] Date of Birth/Sex: Treating RN: 08-02-1922 (87 y.o. [name] [name] Primary Care Nivaan Dicenzo: [name] [name] Other Clinician: Referring Shaquel Josephson: Treating Jahmir Salo/Extender: [name] [name] in Treatment: 1 Active Problems Location of Pain Severity and Description of Pain Patient Has Paino No Site Locations Rate the pain. Current Pain Level: 0 Pain Management and Medication Current Pain Management: Electronic Signature(s) Signed: 01/15/2021 5:55:37 PM By: [name] Gouty RN, BSN Entered By: [name] [name] on 01/15/2021 12:59:07 -------------------------------------------------------------------------------- Patient/Caregiver Education [name] Patient Name: Date of Service: [name] [name] 11/16/2022andnbsp12:45 PM Medical Record Number: [id number] Patient Account Number: [id number] Date of  Birth/Gender: Treating RN: February 02, 1923 (87 y.o. [name] [name] Primary Care Physician: [name] [name] Other Clinician: Referring Physician: Treating Physician/Extender: Luella Cook, Flonnie Overman in Treatment: 1 Education Assessment Education Provided To: Patient Education Topics Provided Pressure: Methods: Explain/Verbal Responses: Reinforcements needed, State content correctly Wound/Skin Impairment: Methods: Explain/Verbal Responses: Reinforcements needed, State content correctly Electronic Signature(s) Signed: 01/15/2021 5:55:37 PM By: [name] Gouty RN, BSN Entered By: [name] [name] on 01/15/2021 13:06:49 -------------------------------------------------------------------------------- Wound Assessment [name] Patient Name: Date of Service: [name] Sink R. 01/15/2021 12:45 PM Medical Record Number: [id number] Patient Account Number: [id number] Date of Birth/Sex: Treating RN: April 05, 1922 (87 y.o. [name] [name] Primary Care Elliana Bal: [name] [name] Other Clinician: Referring Jazmin Ley: Treating Lynx Goodrich/Extender: [name] [name] in Treatment: 1 Wound Status Wound Number: 1 Primary Pressure Ulcer Etiology: Wound Location: Left Calcaneus Wound Open Wounding Event: Pressure Injury Status: Date Acquired: 09/26/2020 Comorbid Cataracts, Chronic Obstructive Pulmonary Disease (COPD), [name] Of Treatment: 1 History: Arrhythmia, Hypertension, Osteoarthritis, Received Radiation Clustered Wound: No Photos Wound Measurements Length: (cm) 1.4 Width: (cm) 2 Depth: (cm) 0.4 Area: (cm) 2.199 Volume: (cm) 0.88 % Reduction in Area: 41.7% % Reduction in Volume: -133.4% Epithelialization: Small (1-33%) Tunneling: No Undermining: Yes Starting Position (o'clock): 9 Ending Position (o'clock): 1 Maximum Distance: (cm) 0.3 Wound Description Classification: Unstageable/Unclassified Wound Margin: Well defined, not  attached Exudate Amount: Medium Exudate Type: Serosanguineous Exudate Color: red, brown Foul Odor After Cleansing: No Slough/Fibrino Yes Wound Bed Granulation Amount: Small (1-33%) Exposed Structure Granulation Quality: Red Fascia Exposed: No Necrotic Amount: Large (67-100%) Fat Layer (Subcutaneous Tissue) Exposed: Yes Necrotic Quality: Adherent Slough Tendon Exposed: No Muscle Exposed: No Joint Exposed: No Bone Exposed: No Electronic Signature(s) Signed: 01/15/2021 5:55:37 PM By: [name] Gouty RN, BSN Entered By: [name] [name] on 01/15/2021 13:27:46 -------------------------------------------------------------------------------- [name] [name] Patient Name: Date of Service: [name] [name], [name] Sarna R. 01/15/2021 12:45 PM Medical Record Number: [id number] Patient Account Number: [id number] Date of Birth/Sex: Treating RN: 11/15/1922 (87 y.o. Martyn Malay, Vaughan Basta Primary Care Tymothy Cass: [name] [name] Other Clinician: Referring Mallisa Alameda: Treating Mycah Mcdougall/Extender: [name] [name] in Treatment: 1 Vital Signs Time Taken: 12:58 Temperature (F): 97.5 Pulse (bpm): 60 Respiratory Rate (breaths/min): 18 Blood Pressure (mmHg): 149/72 Reference Range: 80 - 120 mg / dl Electronic Signature(s) Signed: 01/15/2021 5:55:37 PM By: [name] Gouty RN, BSN Entered By: [name] [name] on 01/15/2021 12:58:58

## 2021-01-16 NOTE — Progress Notes (Signed)
[name], [name] ([id number]) Visit Report for 01/08/2021 Chief Complaint Document Details Patient Name: Date of Service: [name], [name] 01/08/2021 1:15 PM Medical Record Number: [id number] Patient Account Number: [id number] Date of Birth/Sex: Treating RN: [birth year] (87 y.o. [name] Primary Care Provider: Aretta Nip Other Clinician: Referring Provider: Treating Provider/Extender: Luella Cook, Flonnie Overman in Treatment: 0 Information Obtained from: Patient Chief Complaint Left heel ulcer Electronic Signature(s) Signed: 01/08/2021 2:29:25 PM By: Worthy Keeler PA-C Entered By: Worthy Keeler on 01/08/2021 51:76:16 -------------------------------------------------------------------------------- Debridement Details Patient Name: Date of Service: [name], [place] 01/08/2021 1:15 PM Medical Record Number: [id number] Patient Account Number: [id number] Date of Birth/Sex: Treating RN: May 14, 1922 (87 y.o. [name] Primary Care Provider: Aretta Nip Other Clinician: Referring Provider: Treating Provider/Extender: Otis Brace Weeks in Treatment: 0 Debridement Performed for Assessment: Wound #1 Left Calcaneus Performed By: Physician Worthy Keeler, PA Debridement Type: Debridement Level of Consciousness (Pre-procedure): Awake and Alert Pre-procedure Verification/Time Out Yes - 14:29 Taken: Start Time: 14:30 Pain Control: Other : Benzocaine T Area Debrided (L x W): otal 2 (cm) x 2.4 (cm) = 4.8 (cm) Tissue and other material debrided: Non-Viable, Eschar, Slough, Slough Level: Non-Viable Tissue Debridement Description: Selective/Open Wound Instrument: Forceps, Scissors Bleeding: Minimum Hemostasis Achieved: Pressure End Time: 14:36 Response to Treatment: Procedure was tolerated well Level of Consciousness (Post- Awake and Alert procedure): Post Debridement Measurements of Total Wound Length:  (cm) 2 Stage: Unstageable/Unclassified Width: (cm) 2.4 Depth: (cm) 0.1 Volume: (cm) 0.377 Character of Wound/Ulcer Post Debridement: Stable Post Procedure Diagnosis Same as Pre-procedure Electronic Signature(s) Signed: 01/08/2021 5:39:20 PM By: Lorrin Jackson Signed: 01/16/2021 5:50:22 PM By: Worthy Keeler PA-C Entered By: Lorrin Jackson on 01/08/2021 14:38:30 -------------------------------------------------------------------------------- HPI Details Patient Name: Date of Service: [name], [name] Sarna R. 01/08/2021 1:15 PM Medical Record Number: [id number] Patient Account Number: [id number] Date of Birth/Sex: Treating RN: 24-Nov-1922 (87 y.o. [name] Primary Care Provider: Aretta Nip Other Clinician: Referring Provider: Treating Provider/Extender: Cloyd Stagers in Treatment: 0 History of Present Illness HPI Description: 01/08/2021 patient presents today for evaluation of a pressure ulcer which began after she fell in July and broke her arm. This unfortunately has led to her being overall more generally weak and she is not really up and moving around much at all. While she was in the hospital she developed the heel ulcers bilaterally the right was more deep tissue injury that pretty much appears to be healed at this point. At the current skilled nursing facility they have actually been doing a great job taking care of her according to her friend who also helps take care of her. Subsequently the patient's wound on the left heel is eschar covered and probably would benefit from clearing away this eschar so that she can actually see some improvements hopefully. Currently they have been utilizing Iodosorb along with a border foam dressing. Patient has a history of hypertension, COPD, generalized muscle weakness, and atrial fibrillation. Due to her age they elected not to place her on any blood thinners for the atrial fibrillation. Electronic  Signature(s) Signed: 01/08/2021 4:40:46 PM By: Worthy Keeler PA-C Entered By: Worthy Keeler on 01/08/2021 16:40:45 -------------------------------------------------------------------------------- Physical Exam Details Patient Name: Date of Service: [name], [name] 01/08/2021 1:15 PM Medical Record Number: [id number] Patient Account Number: [id number] Date of Birth/Sex: Treating RN: 1922-10-23 (87 y.o. [name] Primary Care Provider: Milagros Evener  R Other Clinician: Referring Provider: Treating Provider/Extender: Luella Cook, [place] R Weeks in Treatment: 0 Constitutional patient is hypertensive.. pulse regular and within target range for patient.Marland Kitchen respirations regular, non-labored and within target range for patient.Marland Kitchen temperature within target range for patient.. Well-nourished and well-hydrated in no acute distress. Eyes conjunctiva clear no eyelid edema noted. pupils equal round and reactive to light and accommodation. Ears, Nose, Mouth, and Throat no gross abnormality of ear auricles or external auditory canals. normal hearing noted during conversation. mucus membranes moist. Respiratory normal breathing without difficulty. Cardiovascular 1+ dorsalis pedis/posterior tibialis pulses. trace pitting edema of the bilateral lower extremities. Musculoskeletal Patient unable to walk without assistance. no significant deformity or arthritic changes, no loss or range of motion, no clubbing. Psychiatric this patient is able to make decisions and demonstrates good insight into disease process. Alert and Oriented x 3. pleasant and cooperative. Notes Upon inspection patient's wound bed actually did show eschar covering the surface of the wound. Nonetheless I do believe she would benefit from removing this eschar and continuing with the Iodosorb. For that reason I did actually discuss this with the patient and her friend today and subsequently we did proceed with  debridement to clear this away. She tolerated that without complication and postdebridement patient's wound bed showed signs of improvement. I do believe that the Iodosorb will have a better shot at helping to clean this up. I did perform just a light debridement here as I am unsure of her vascular status We are going to refer her for vascular evaluation. Electronic Signature(s) Signed: 01/08/2021 4:41:48 PM By: Worthy Keeler PA-C Entered By: Worthy Keeler on 01/08/2021 16:41:48 -------------------------------------------------------------------------------- Physician Orders Details Patient Name: Date of Service: [name], [place] 01/08/2021 1:15 PM Medical Record Number: [id number] Patient Account Number: [id number] Date of Birth/Sex: Treating RN: 03-24-1922 (87 y.o. [name] Primary Care Provider: Aretta Nip Other Clinician: Referring Provider: Treating Provider/Extender: Luella Cook, Flonnie Overman in Treatment: 0 Verbal / Phone Orders: No Diagnosis Coding ICD-10 Coding Code Description [phone] Pressure ulcer of left heel, unstageable I10 Essential (primary) hypertension J44.9 Chronic obstructive pulmonary disease, unspecified M62.81 Muscle weakness (generalized) I48.0 Paroxysmal atrial fibrillation Follow-up Appointments ppointment in 1 week. - with Margarita Grizzle Return A Bathing/ Shower/ Hygiene May shower and wash wound with soap and water. - with dressing change Off-Loading Other: - Continue to use Bunny Boots/Float Heels Additional Orders / Instructions Follow Nutritious Diet - Continue Prostat Non Wound Condition Protect area with: - Continue foam border to right heel for protection. Wound Treatment Wound #1 - Calcaneus Wound Laterality: Left Cleanser: Soap and Water Every Other Day/30 Days Discharge Instructions: May shower and wash wound with dial antibacterial soap and water prior to dressing change. Cleanser: Wound Cleanser Every Other  Day/30 Days Discharge Instructions: Cleanse the wound with wound cleanser prior to applying a clean dressing using gauze sponges, not tissue or cotton balls. Prim Dressing: Iodosorb Gel 10 (gm) Tube Every Other Day/30 Days ary Discharge Instructions: Apply to wound bed as instructed Secondary Dressing: Woven Gauze Sponges 2x2 in Every Other Day/30 Days Discharge Instructions: Apply over primary dressing as directed. Secondary Dressing: Zetuvit Plus Silicone Border Dressing 4x4 (in/in) Every Other Day/30 Days Discharge Instructions: Or equivalent silicone foam border Services and Therapies nkle Brachial Index (ABI)/TBI - Facility to obtain mobile ABI/TBI, please send results to next appointment - (ICD10 L89.620 - Pressure ulcer of left A heel, unstageable) Electronic Signature(s) Signed: 01/08/2021 5:39:20 PM  By: Lorrin Jackson Signed: 01/16/2021 5:50:22 PM By: Worthy Keeler PA-C Entered By: Lorrin Jackson on 01/08/2021 14:43:58 Prescription 01/08/2021 -------------------------------------------------------------------------------- Everlean Patterson PA Patient Name: Provider: 1922-04-27 [id number] Date of Birth: NPI#Wanda Plump [id number] Sex: DEA #: [phone] Phone #: License #: [place] Patient Address: [address] [place], [place] [address] [address] [place],  [address] [phone] Allergies meperidine; NSAIDS (Non-Steroidal Anti-Inflammatory Drug); Opioids - Morphine Analogues; tolmetin Provider's Orders nkle Brachial Index (ABI)/TBI - ICD10: J19.417 - Facility to obtain mobile ABI/TBI, please send results to next appointment A Hand Signature: Date(s): Electronic Signature(s) Signed: 01/08/2021 5:39:20 PM By: Lorrin Jackson Signed: 01/16/2021 5:50:22 PM By: Worthy Keeler PA-C Entered By: Lorrin Jackson on 01/08/2021  14:43:59 -------------------------------------------------------------------------------- Problem List Details Patient Name: Date of Service: [name], [place] 01/08/2021 1:15 PM Medical Record Number: [id number] Patient Account Number: [id number] Date of Birth/Sex: Treating RN: [birth year] (87 y.o. [name] Primary Care Provider: Aretta Nip Other Clinician: Referring Provider: Treating Provider/Extender: Otis Brace Weeks in Treatment: 0 Active Problems ICD-10 Encounter Code Description Active Date MDM Diagnosis L89.620 Pressure ulcer of left heel, unstageable 01/08/2021 No Yes I10 Essential (primary) hypertension 01/08/2021 No Yes J44.9 Chronic obstructive pulmonary disease, unspecified 01/08/2021 No Yes M62.81 Muscle weakness (generalized) 01/08/2021 No Yes I48.0 Paroxysmal atrial fibrillation 01/08/2021 No Yes Inactive Problems Resolved Problems Electronic Signature(s) Signed: 01/08/2021 2:28:58 PM By: Worthy Keeler PA-C Entered By: Worthy Keeler on 01/08/2021 14:28:58 -------------------------------------------------------------------------------- Progress Note Details Patient Name: Date of Service: [name], [place]. 01/08/2021 1:15 PM Medical Record Number: [id number] Patient Account Number: [id number] Date of Birth/Sex: Treating RN: [birth year] (87 y.o. [name] Primary Care Provider: Aretta Nip Other Clinician: Referring Provider: Treating Provider/Extender: Cloyd Stagers in Treatment: 0 Subjective Chief Complaint Information obtained from Patient Left heel ulcer History of Present Illness (HPI) 01/08/2021 patient presents today for evaluation of a pressure ulcer which began after she fell in July and broke her arm. This unfortunately has led to her being overall more generally weak and she is not really up and moving around much at all. While she was in the hospital she  developed the heel ulcers bilaterally the right was more deep tissue injury that pretty much appears to be healed at this point. At the current skilled nursing facility they have actually been doing a great job taking care of her according to her friend who also helps take care of her. Subsequently the patient's wound on the left heel is eschar covered and probably would benefit from clearing away this eschar so that she can actually see some improvements hopefully. Currently they have been utilizing Iodosorb along with a border foam dressing. Patient has a history of hypertension, COPD, generalized muscle weakness, and atrial fibrillation. Due to her age they elected not to place her on any blood thinners for the atrial fibrillation. Patient History Information obtained from Patient, Caregiver. Allergies meperidine, NSAIDS (Non-Steroidal Anti-Inflammatory Drug), Opioids - Morphine Analogues, tolmetin Family History Cancer - Siblings, Hypertension - Father,Mother, Stroke - Siblings, No family history of Diabetes, Heart Disease, Hereditary Spherocytosis, Kidney Disease, Lung Disease, Seizures, Thyroid Problems, Tuberculosis. Social History Never smoker, Alcohol Use - Never, Drug Use - No History, Caffeine Use - Rarely. Medical History Eyes Patient has history of Cataracts - Removed Respiratory Patient has history of Chronic Obstructive Pulmonary Disease (COPD) Cardiovascular Patient has  history of Arrhythmia, Hypertension Musculoskeletal Patient has history of Osteoarthritis Oncologic Patient has history of Received Radiation Denies history of Received Chemotherapy Medical A Surgical History Notes nd Ear/Nose/Mouth/Throat Hard of Hearing Respiratory Sarcoidosis Endocrine Hypothyroidism Oncologic Right-Breast Cancer Review of Systems (ROS) Eyes Complains or has symptoms of Glasses / Contacts - Reading. Respiratory Complains or has symptoms of Shortness of Breath - On O2 @  3l. Gastrointestinal Denies complaints or symptoms of Frequent diarrhea, Nausea, Vomiting. Endocrine Denies complaints or symptoms of Heat/cold intolerance. Genitourinary Denies complaints or symptoms of Frequent urination. Integumentary (Skin) Complains or has symptoms of Wounds. Neurologic Denies complaints or symptoms of Numbness/parasthesias. Psychiatric Denies complaints or symptoms of Claustrophobia, Suicidal. Objective Constitutional patient is hypertensive.. pulse regular and within target range for patient.Marland Kitchen respirations regular, non-labored and within target range for patient.Marland Kitchen temperature within target range for patient.. Well-nourished and well-hydrated in no acute distress. Vitals Time Taken: 1:22 PM, Temperature: 98 F, Pulse: 61 bpm, Respiratory Rate: 18 breaths/min, Blood Pressure: 165/65 mmHg. Eyes conjunctiva clear no eyelid edema noted. pupils equal round and reactive to light and accommodation. Ears, Nose, Mouth, and Throat no gross abnormality of ear auricles or external auditory canals. normal hearing noted during conversation. mucus membranes moist. Respiratory normal breathing without difficulty. Cardiovascular 1+ dorsalis pedis/posterior tibialis pulses. trace pitting edema of the bilateral lower extremities. Musculoskeletal Patient unable to walk without assistance. no significant deformity or arthritic changes, no loss or range of motion, no clubbing. Psychiatric this patient is able to make decisions and demonstrates good insight into disease process. Alert and Oriented x 3. pleasant and cooperative. General Notes: Upon inspection patient's wound bed actually did show eschar covering the surface of the wound. Nonetheless I do believe she would benefit from removing this eschar and continuing with the Iodosorb. For that reason I did actually discuss this with the patient and her friend today and subsequently we did proceed with debridement to clear this  away. She tolerated that without complication and postdebridement patient's wound bed showed signs of improvement. I do believe that the Iodosorb will have a better shot at helping to clean this up. I did perform just a light debridement here as I am unsure of her vascular status We are going to refer her for vascular evaluation. Integumentary (Hair, Skin) Wound #1 status is Open. Original cause of wound was Pressure Injury. The date acquired was: 09/26/2020. The wound is located on the Left Calcaneus. The wound measures 2cm length x 2.4cm width x 0.1cm depth; 3.77cm^2 area and 0.377cm^3 volume. There is Fat Layer (Subcutaneous Tissue) exposed. There is no tunneling or undermining noted. There is a medium amount of serosanguineous drainage noted. The wound margin is distinct with the outline attached to the wound base. There is small (1-33%) red granulation within the wound bed. There is a large (67-100%) amount of necrotic tissue within the wound bed including Eschar and Adherent Slough. Assessment Active Problems ICD-10 Pressure ulcer of left heel, unstageable Essential (primary) hypertension Chronic obstructive pulmonary disease, unspecified Muscle weakness (generalized) Paroxysmal atrial fibrillation Procedures Wound #1 Pre-procedure diagnosis of Wound #1 is a Pressure Ulcer located on the Left Calcaneus . There was a Selective/Open Wound Non-Viable Tissue Debridement with a total area of 4.8 sq cm performed by Worthy Keeler, PA. With the following instrument(s): Forceps, and Scissors to remove Non-Viable tissue/material. Material removed includes Eschar and Slough and after achieving pain control using Other (Benzocaine). No specimens were taken. A time out was conducted at 14:29, prior to  the start of the procedure. A Minimum amount of bleeding was controlled with Pressure. The procedure was tolerated well. Post Debridement Measurements: 2cm length x 2.4cm width x 0.1cm depth; 0.377cm^3  volume. Post debridement Stage noted as Unstageable/Unclassified. Character of Wound/Ulcer Post Debridement is stable. Post procedure Diagnosis Wound #1: Same as Pre-Procedure Plan Follow-up Appointments: Return Appointment in 1 week. - with Glynn Octave Shower/ Hygiene: May shower and wash wound with soap and water. - with dressing change Off-Loading: Other: - Continue to use Bunny Boots/Float Heels Additional Orders / Instructions: Follow Nutritious Diet - Continue Prostat Non Wound Condition: Protect area with: - Continue foam border to right heel for protection. Services and Therapies ordered were: Ankle Brachial Index (ABI)/TBI - Facility to obtain mobile ABI/TBI, please send results to next appointment WOUND #1: - Calcaneus Wound Laterality: Left Cleanser: Soap and Water Every Other Day/30 Days Discharge Instructions: May shower and wash wound with dial antibacterial soap and water prior to dressing change. Cleanser: Wound Cleanser Every Other Day/30 Days Discharge Instructions: Cleanse the wound with wound cleanser prior to applying a clean dressing using gauze sponges, not tissue or cotton balls. Prim Dressing: Iodosorb Gel 10 (gm) Tube Every Other Day/30 Days ary Discharge Instructions: Apply to wound bed as instructed Secondary Dressing: Woven Gauze Sponges 2x2 in Every Other Day/30 Days Discharge Instructions: Apply over primary dressing as directed. Secondary Dressing: Zetuvit Plus Silicone Border Dressing 4x4 (in/in) Every Other Day/30 Days Discharge Instructions: Or equivalent silicone foam border 1. I would recommend currently that we actually continue with the Iodosorb I think this is doing a good job and the facility already has it. It should doing better now that I remove the eschar. 2. I am also can recommend that we have the patient continue with the border foam dressing to cover which I think is doing well. 3. I would also suggest the patient continue to monitor  for any signs of worsening or infection perforated can keep an eye as well but I do believe that the facility is taking excellent care of her I am very pleased in that regard. 4. She should continue with appropriate offloading including using the Prevalon offloading boots which I think are doing well. 5. I do feel like that the patient can do physical therapy due to the location of the wound that should not affect anything here. Electronic Signature(s) Signed: 01/08/2021 4:42:47 PM By: Worthy Keeler PA-C Entered By: Worthy Keeler on 01/08/2021 16:42:46 -------------------------------------------------------------------------------- HxROS Details Patient Name: Date of Service: [name], [place] 01/08/2021 1:15 PM Medical Record Number: [id number] Patient Account Number: [id number] Date of Birth/Sex: Treating RN: 01/13/1923 (87 y.o. [name] Primary Care Provider: Aretta Nip Other Clinician: Referring Provider: Treating Provider/Extender: Otis Brace Weeks in Treatment: 0 Information Obtained From Patient Caregiver Eyes Complaints and Symptoms: Positive for: Glasses / Contacts - Reading Medical History: Positive for: Cataracts - Removed Respiratory Complaints and Symptoms: Positive for: Shortness of Breath - On O2 @ 3l Medical History: Positive for: Chronic Obstructive Pulmonary Disease (COPD) Past Medical History Notes: Sarcoidosis Gastrointestinal Complaints and Symptoms: Negative for: Frequent diarrhea; Nausea; Vomiting Endocrine Complaints and Symptoms: Negative for: Heat/cold intolerance Medical History: Past Medical History Notes: Hypothyroidism Genitourinary Complaints and Symptoms: Negative for: Frequent urination Integumentary (Skin) Complaints and Symptoms: Positive for: Wounds Neurologic Complaints and Symptoms: Negative for: Numbness/parasthesias Psychiatric Complaints and Symptoms: Negative for:  Claustrophobia; Suicidal Ear/Nose/Mouth/Throat Medical History: Past Medical History Notes: Hard of Hearing Hematologic/Lymphatic  Cardiovascular Medical History: Positive for: Arrhythmia; Hypertension Immunological Musculoskeletal Medical History: Positive for: Osteoarthritis Oncologic Medical History: Positive for: Received Radiation Negative for: Received Chemotherapy Past Medical History Notes: Right-Breast Cancer HBO Extended History Items Eyes: Cataracts Immunizations Pneumococcal Vaccine: Received Pneumococcal Vaccination: Yes Received Pneumococcal Vaccination On or After 60th Birthday: Yes Implantable Devices None Family and Social History Cancer: Yes - Siblings; Diabetes: No; Heart Disease: No; Hereditary Spherocytosis: No; Hypertension: Yes - Father,Mother; Kidney Disease: No; Lung Disease: No; Seizures: No; Stroke: Yes - Siblings; Thyroid Problems: No; Tuberculosis: No; Never smoker; Alcohol Use: Never; Drug Use: No History; Caffeine Use: Rarely; Financial Concerns: No; Food, Clothing or Shelter Needs: No; Support System Lacking: No; Transportation Concerns: No Electronic Signature(s) Signed: 01/08/2021 5:39:20 PM By: Lorrin Jackson Signed: 01/16/2021 5:50:22 PM By: Worthy Keeler PA-C Entered By: Lorrin Jackson on 01/08/2021 14:19:23 -------------------------------------------------------------------------------- [place] Details Patient Name: Date of Service: [name], [name] Sarna R. 01/08/2021 Medical Record Number: [id number] Patient Account Number: [id number] Date of Birth/Sex: Treating RN: January 24, 1923 (87 y.o. [name] Primary Care Provider: Aretta Nip Other Clinician: Referring Provider: Treating Provider/Extender: Otis Brace Weeks in Treatment: 0 Diagnosis Coding ICD-10 Codes Code Description [phone] Pressure ulcer of left heel, unstageable I10 Essential (primary) hypertension J44.9 Chronic obstructive  pulmonary disease, unspecified M62.81 Muscle weakness (generalized) I48.0 Paroxysmal atrial fibrillation Facility Procedures CPT4 Code: [id number] Description: 99213 - WOUND CARE VISIT-LEV 3 EST PT Modifier: 25 Quantity: 1 CPT4 Code: [id number] Description: 21115 - DEBRIDE WOUND 1ST 20 SQ CM OR < ICD-10 Diagnosis Description L89.620 Pressure ulcer of left heel, unstageable Modifier: Quantity: 1 Physician Procedures : CPT4 Code Description Modifier [id number] 99204 - WC PHYS LEVEL 4 - NEW PT 25 ICD-10 Diagnosis Description L89.620 Pressure ulcer of left heel, unstageable I10 Essential (primary) hypertension J44.9 Chronic obstructive pulmonary disease, unspecified  M62.81 Muscle weakness (generalized) Quantity: 1 : [id number] - WC PHYS DEBR WO ANESTH 20 SQ CM 1 ICD-10 Diagnosis Description L89.620 Pressure ulcer of left heel, unstageable Quantity: Electronic Signature(s) Signed: 01/08/2021 4:43:05 PM By: Worthy Keeler PA-C Entered By: Worthy Keeler on 01/08/2021 16:43:05

## 2021-01-21 DIAGNOSIS — M6281 Muscle weakness (generalized): Secondary | ICD-10-CM | POA: Diagnosis not present

## 2021-01-21 DIAGNOSIS — S42201D Unspecified fracture of upper end of right humerus, subsequent encounter for fracture with routine healing: Secondary | ICD-10-CM | POA: Diagnosis not present

## 2021-01-29 ENCOUNTER — Other Ambulatory Visit: Payer: Self-pay

## 2021-01-29 ENCOUNTER — Encounter (HOSPITAL_BASED_OUTPATIENT_CLINIC_OR_DEPARTMENT_OTHER): Payer: Medicare PPO | Admitting: Physician Assistant

## 2021-01-29 DIAGNOSIS — J449 Chronic obstructive pulmonary disease, unspecified: Secondary | ICD-10-CM | POA: Diagnosis not present

## 2021-01-29 DIAGNOSIS — L89620 Pressure ulcer of left heel, unstageable: Secondary | ICD-10-CM | POA: Diagnosis not present

## 2021-01-29 DIAGNOSIS — L98422 Non-pressure chronic ulcer of back with fat layer exposed: Secondary | ICD-10-CM | POA: Diagnosis not present

## 2021-01-29 DIAGNOSIS — M1990 Unspecified osteoarthritis, unspecified site: Secondary | ICD-10-CM | POA: Diagnosis not present

## 2021-01-29 DIAGNOSIS — I10 Essential (primary) hypertension: Secondary | ICD-10-CM | POA: Diagnosis not present

## 2021-01-29 NOTE — Progress Notes (Addendum)
[name], [name] ([id number]) Visit Report for 01/29/2021 Chief Complaint Document Details Patient Name: Date of Service: [name], [name] 01/29/2021 12:30 PM Medical Record Number: [id number] Patient Account Number: [id number] Date of Birth/Sex: Treating RN: 1922-11-29 (87 y.o. [name] Primary Care Provider: Aretta Nip Other Clinician: Referring Provider: Treating Provider/Extender: Luella Cook, Flonnie Overman in Treatment: 3 Information Obtained from: Patient Chief Complaint Left heel ulcer Electronic Signature(s) Signed: 01/29/2021 12:58:41 PM By: Worthy Keeler PA-C Entered By: Worthy Keeler on 01/29/2021 12:58:41 -------------------------------------------------------------------------------- HPI Details Patient Name: Date of Service: [name], [place] 01/29/2021 12:30 PM Medical Record Number: [id number] Patient Account Number: [id number] Date of Birth/Sex: Treating RN: 07-Aug-1922 (87 y.o. [name] Primary Care Provider: Aretta Nip Other Clinician: Referring Provider: Treating Provider/Extender: Cloyd Stagers in Treatment: 3 History of Present Illness HPI Description: 01/08/2021 patient presents today for evaluation of a pressure ulcer which began after she fell in July and broke her arm. This unfortunately has led to her being overall more generally weak and she is not really up and moving around much at all. While she was in the hospital she developed the heel ulcers bilaterally the right was more deep tissue injury that pretty much appears to be healed at this [name]. At the current skilled nursing facility they have actually been doing a great job taking care of her according to her friend who also helps take care of her. Subsequently the patient's wound on the left heel is eschar covered and probably would benefit from clearing away this eschar so that she can actually see some  improvements hopefully. Currently they have been utilizing Iodosorb along with a border foam dressing. Patient has a history of hypertension, COPD, generalized muscle weakness, and atrial fibrillation. Due to her age they elected not to place her on any blood thinners for the atrial fibrillation. 01/15/2021 upon evaluation today patient appears to be doing better in regard to the heel ulcer. I do not have initially upon evaluation today her arterial studies that were done at the facility. Therefore I did not perform any sharp debridement while she was present during the office visit today. Nonetheless I am going to suggest based on what was seen currently that we probably once we get this with you at everything appears to be okay consider stop debridement at the next visit. 01/29/2021 upon evaluation today patient appears to be doing well with regard to her wound all things considered her blood flow is definitely not very good based on the review of the arterial study although it was really incomplete in my opinion there was not even a brachial reading to compare to but nonetheless the readings in the extremities were very poor. In the end I did actually discuss with her going ahead and going to vascular and they have already get that appointment set up which is great news. Fortunately there does not appear to be signs of infection currently. The wound is really not making tremendous progress but again I would not expect it to do so with where things stand currently until we can get this moving a little bit better direction. Electronic Signature(s) Signed: 01/29/2021 1:26:25 PM By: Worthy Keeler PA-C Entered By: Worthy Keeler on 01/29/2021 13:26:25 -------------------------------------------------------------------------------- Physical Exam Details Patient Name: Date of Service: [name], [name] 01/29/2021 12:30 PM Medical Record Number: [id number] Patient Account Number: [id number] Date  of Birth/Sex: Treating RN: 03/13/1922 (87  y.o. [name] Primary Care Provider: Aretta Nip Other Clinician: Referring Provider: Treating Provider/Extender: Otis Brace Weeks in Treatment: 3 Constitutional Well-nourished and well-hydrated in no acute distress. Respiratory normal breathing without difficulty. Psychiatric this patient is able to make decisions and demonstrates good insight into disease process. Alert and Oriented x 3. pleasant and cooperative. Notes Upon inspection patient's wound bed actually showed signs of good granulation and epithelization at this [name]. Fortunately there is no sign of infection actively locally nor systemically and this is great news. No fevers, chills, nausea, vomiting, or diarrhea. Electronic Signature(s) Signed: 01/29/2021 1:26:40 PM By: Worthy Keeler PA-C Entered By: Worthy Keeler on 01/29/2021 13:26:39 -------------------------------------------------------------------------------- Physician Orders Details Patient Name: Date of Service: [name] 01/29/2021 12:30 PM Medical Record Number: [id number] Patient Account Number: [id number] Date of Birth/Sex: Treating RN: 1923/01/01 (87 y.o. [name] Primary Care Provider: Aretta Nip Other Clinician: Referring Provider: Treating Provider/Extender: Cloyd Stagers in Treatment: 3 Verbal / Phone Orders: No Diagnosis Coding ICD-10 Coding Code Description [phone] Pressure ulcer of left heel, unstageable I10 Essential (primary) hypertension J44.9 Chronic obstructive pulmonary disease, unspecified M62.81 Muscle weakness (generalized) I48.0 Paroxysmal atrial fibrillation Follow-up Appointments Return appointment in 3 weeks. - with Margarita Grizzle Other: - appointment with vein and vascular 02/14/21 Bathing/ Shower/ Hygiene May shower and wash wound with soap and water. - with dressing  change Off-Loading Other: - Continue to use Bunny Boots/Float Heels especially while in bed Additional Orders / Instructions Follow Nutritious Diet - Continue Prostat Non Wound Condition Protect area with: - Continue foam border to right heel for protection. Wound Treatment Wound #1 - Calcaneus Wound Laterality: Left Cleanser: Soap and Water Every Other Day/30 Days Discharge Instructions: May shower and wash wound with dial antibacterial soap and water prior to dressing change. Cleanser: Wound Cleanser Every Other Day/30 Days Discharge Instructions: Cleanse the wound with wound cleanser prior to applying a clean dressing using gauze sponges, not tissue or cotton balls. Prim Dressing: Iodosorb Gel 10 (gm) Tube Every Other Day/30 Days ary Discharge Instructions: Apply to wound bed as instructed Secondary Dressing: Woven Gauze Sponges 2x2 in Every Other Day/30 Days Discharge Instructions: Apply over primary dressing as directed. Secondary Dressing: Zetuvit Plus Silicone Border Dressing 4x4 (in/in) Every Other Day/30 Days Discharge Instructions: Or equivalent silicone foam border Electronic Signature(s) Signed: 01/29/2021 4:52:58 PM By: Baruch Gouty RN, BSN Signed: 01/29/2021 6:11:30 PM By: Worthy Keeler PA-C Entered By: Baruch Gouty on 01/29/2021 13:23:42 -------------------------------------------------------------------------------- Problem List Details Patient Name: Date of Service: [name], [place] 01/29/2021 12:30 PM Medical Record Number: [id number] Patient Account Number: [id number] Date of Birth/Sex: Treating RN: 05/14/1922 (87 y.o. [name] Primary Care Provider: Aretta Nip Other Clinician: Referring Provider: Treating Provider/Extender: Otis Brace Weeks in Treatment: 3 Active Problems ICD-10 Encounter Code Description Active Date MDM Diagnosis L89.620 Pressure ulcer of left heel, unstageable 01/08/2021 No Yes I10  Essential (primary) hypertension 01/08/2021 No Yes J44.9 Chronic obstructive pulmonary disease, unspecified 01/08/2021 No Yes M62.81 Muscle weakness (generalized) 01/08/2021 No Yes I48.0 Paroxysmal atrial fibrillation 01/08/2021 No Yes Inactive Problems Resolved Problems Electronic Signature(s) Signed: 01/29/2021 12:58:29 PM By: Worthy Keeler PA-C Entered By: Worthy Keeler on 01/29/2021 12:58:29 -------------------------------------------------------------------------------- Progress Note Details Patient Name: Date of Service: [place], [place] 01/29/2021 12:30 PM Medical Record Number: [id number] Patient Account Number: [id number] Date of Birth/Sex: Treating RN: 31-Jul-1922 (87 y.o. F)  Baruch Gouty Primary Care Provider: Aretta Nip Other Clinician: Referring Provider: Treating Provider/Extender: Cloyd Stagers in Treatment: 3 Subjective Chief Complaint Information obtained from Patient Left heel ulcer History of Present Illness (HPI) 01/08/2021 patient presents today for evaluation of a pressure ulcer which began after she fell in July and broke her arm. This unfortunately has led to her being overall more generally weak and she is not really up and moving around much at all. While she was in the hospital she developed the heel ulcers bilaterally the right was more deep tissue injury that pretty much appears to be healed at this [name]. At the current skilled nursing facility they have actually been doing a great job taking care of her according to her friend who also helps take care of her. Subsequently the patient's wound on the left heel is eschar covered and probably would benefit from clearing away this eschar so that she can actually see some improvements hopefully. Currently they have been utilizing Iodosorb along with a border foam dressing. Patient has a history of hypertension, COPD, generalized muscle weakness, and atrial fibrillation.  Due to her age they elected not to place her on any blood thinners for the atrial fibrillation. 01/15/2021 upon evaluation today patient appears to be doing better in regard to the heel ulcer. I do not have initially upon evaluation today her arterial studies that were done at the facility. Therefore I did not perform any sharp debridement while she was present during the office visit today. Nonetheless I am going to suggest based on what was seen currently that we probably once we get this with you at everything appears to be okay consider stop debridement at the next visit. 01/29/2021 upon evaluation today patient appears to be doing well with regard to her wound all things considered her blood flow is definitely not very good based on the review of the arterial study although it was really incomplete in my opinion there was not even a brachial reading to compare to but nonetheless the readings in the extremities were very poor. In the end I did actually discuss with her going ahead and going to vascular and they have already get that appointment set up which is great news. Fortunately there does not appear to be signs of infection currently. The wound is really not making tremendous progress but again I would not expect it to do so with where things stand currently until we can get this moving a little bit better direction. Objective Constitutional Well-nourished and well-hydrated in no acute distress. Vitals Time Taken: 1:00 PM, Temperature: 97.5 F, Pulse: 54 bpm, Respiratory Rate: 18 breaths/min, Blood Pressure: 152/70 mmHg. Respiratory normal breathing without difficulty. Psychiatric this patient is able to make decisions and demonstrates good insight into disease process. Alert and Oriented x 3. pleasant and cooperative. General Notes: Upon inspection patient's wound bed actually showed signs of good granulation and epithelization at this [name]. Fortunately there is no sign of infection  actively locally nor systemically and this is great news. No fevers, chills, nausea, vomiting, or diarrhea. Integumentary (Hair, Skin) Wound #1 status is Open. Original cause of wound was Pressure Injury. The date acquired was: 09/26/2020. The wound has been in treatment 3 weeks. The wound is located on the Left Calcaneus. The wound measures 1.1cm length x 1.8cm width x 0.3cm depth; 1.555cm^2 area and 0.467cm^3 volume. There is Fat Layer (Subcutaneous Tissue) exposed. There is no tunneling noted, however, there is undermining starting  at 3:00 and ending at 9:00 with a maximum distance of 0.2cm. There is a medium amount of serosanguineous drainage noted. The wound margin is well defined and not attached to the wound base. There is small (1-33%) red granulation within the wound bed. There is a large (67-100%) amount of necrotic tissue within the wound bed including Adherent Slough. Assessment Active Problems ICD-10 Pressure ulcer of left heel, unstageable Essential (primary) hypertension Chronic obstructive pulmonary disease, unspecified Muscle weakness (generalized) Paroxysmal atrial fibrillation Plan Follow-up Appointments: Return appointment in 3 weeks. - with Margarita Grizzle Other: - appointment with vein and vascular 02/14/21 Bathing/ Shower/ Hygiene: May shower and wash wound with soap and water. - with dressing change Off-Loading: Other: - Continue to use Bunny Boots/Float Heels especially while in bed Additional Orders / Instructions: Follow Nutritious Diet - Continue Prostat Non Wound Condition: Protect area with: - Continue foam border to right heel for protection. WOUND #1: - Calcaneus Wound Laterality: Left Cleanser: Soap and Water Every Other Day/30 Days Discharge Instructions: May shower and wash wound with dial antibacterial soap and water prior to dressing change. Cleanser: Wound Cleanser Every Other Day/30 Days Discharge Instructions: Cleanse the wound with wound cleanser prior to  applying a clean dressing using gauze sponges, not tissue or cotton balls. Prim Dressing: Iodosorb Gel 10 (gm) Tube Every Other Day/30 Days ary Discharge Instructions: Apply to wound bed as instructed Secondary Dressing: Woven Gauze Sponges 2x2 in Every Other Day/30 Days Discharge Instructions: Apply over primary dressing as directed. Secondary Dressing: Zetuvit Plus Silicone Border Dressing 4x4 (in/in) Every Other Day/30 Days Discharge Instructions: Or equivalent silicone foam border 1. I would recommend that we go ahead and continue with the Iodoflex I think that still the best way to go. 2. I am also can recommend that we have the patient continue with the dressing changes 3 times per week which I think is an appropriate regimen. 3. The biggest thing right now is that I really need to give her time to get into vascular which is next week and see what they have to say. Once we get their evaluation and see what they can or cannot do we will know how to proceed at this [name]. We will see patient back for reevaluation in 1 week here in the clinic. If anything worsens or changes patient will contact our office for additional recommendations. Electronic Signature(s) Signed: 01/29/2021 1:27:47 PM By: Worthy Keeler PA-C Entered By: Worthy Keeler on 01/29/2021 13:27:46 -------------------------------------------------------------------------------- SuperBill Details Patient Name: Date of Service: [name], [name] 01/29/2021 Medical Record Number: [id number] Patient Account Number: [id number] Date of Birth/Sex: Treating RN: [birth year] (87 y.o. [name] Primary Care Provider: Aretta Nip Other Clinician: Referring Provider: Treating Provider/Extender: Cloyd Stagers in Treatment: 3 Diagnosis Coding ICD-10 Codes Code Description [phone] Pressure ulcer of left heel, unstageable I10 Essential (primary) hypertension J44.9 Chronic obstructive  pulmonary disease, unspecified M62.81 Muscle weakness (generalized) I48.0 Paroxysmal atrial fibrillation Facility Procedures CPT4 Code: [id number] Description: 99213 - WOUND CARE VISIT-LEV 3 EST PT Modifier: Quantity: 1 Physician Procedures : CPT4 Code Description Modifier [id number] - WC PHYS LEVEL 4 - EST PT ICD-10 Diagnosis Description L89.620 Pressure ulcer of left heel, unstageable I10 Essential (primary) hypertension J44.9 Chronic obstructive pulmonary disease, unspecified M62.81  Muscle weakness (generalized) Quantity: 1 Electronic Signature(s) Signed: 01/29/2021 4:52:58 PM By: Baruch Gouty RN, BSN Signed: 01/29/2021 6:11:30 PM By: Worthy Keeler PA-C Previous Signature: 01/29/2021 1:28:01 PM Version By: Joaquim Lai  III, Akeisha Lagerquist PA-C Entered By: Baruch Gouty on 01/29/2021 13:35:39

## 2021-01-29 NOTE — Progress Notes (Signed)
[name], [name] ([id number]) Visit Report for 01/29/2021 Arrival Information Details Patient Name: Date of Service: [name], [name] 01/29/2021 12:30 PM Medical Record Number: [id number] Patient Account Number: [id number] Date of Birth/Sex: Treating RN: Nov 03, 1922 (87 y.o. [name] Primary Care [name]: [name] R Other Clinician: Referring [name]: Treating [name]/Extender: [name] in Treatment: 3 Visit Information History Since Last Visit Added or deleted any medications: No Patient Arrived: Wheel Chair Any new allergies or adverse reactions: No Arrival Time: 12:57 Had a fall or experienced change in No Accompanied By: friend activities of daily living that may affect Transfer Assistance: None risk of falls: Patient Identification Verified: Yes Signs or symptoms of abuse/neglect since last visito No Secondary Verification Process Completed: Yes Hospitalized since last visit: No Patient Requires Transmission-Based Precautions: No Implantable device outside of the clinic excluding No Patient Has Alerts: Yes cellular tissue based products placed in the center Patient Alerts: Bilat ABI's NonComp since last visit: BP left arm ONLY Has Dressing in Place as Prescribed: Yes Pain Present Now: No Notes pt stayed in wheelchair Electronic Signature(s) Signed: 01/29/2021 4:52:58 PM By: [name] Gouty RN, BSN Entered By: [name] on 01/29/2021 13:00:05 -------------------------------------------------------------------------------- Clinic Level of Care Assessment Details Patient Name: Date of Service: [name] 01/29/2021 12:30 PM Medical Record Number: [id number] Patient Account Number: [id number] Date of Birth/Sex: Treating RN: 03/06/1922 (87 y.o. [name] Primary Care Breon Rehm: Aretta Nip Other Clinician: Referring Kriston Pasquarello: Treating Gaynell Eggleton/Extender: [name] in Treatment: 3 Clinic Level of Care Assessment Items TOOL 4 Quantity Score []  - 0 Use when only an EandM is performed on FOLLOW-UP visit ASSESSMENTS - Nursing Assessment / Reassessment X- 1 10 Reassessment of Co-morbidities (includes updates in patient status) X- 1 5 Reassessment of Adherence to Treatment Plan ASSESSMENTS - Wound and Skin A ssessment / Reassessment X - Simple Wound Assessment / Reassessment - one wound 1 5 []  - 0 Complex Wound Assessment / Reassessment - multiple wounds []  - 0 Dermatologic / Skin Assessment (not related to wound area) ASSESSMENTS - Focused Assessment []  - 0 Circumferential Edema Measurements - multi extremities []  - 0 Nutritional Assessment / Counseling / Intervention X- 1 5 Lower Extremity Assessment (monofilament, tuning fork, pulses) []  - 0 Peripheral Arterial Disease Assessment (using hand held doppler) ASSESSMENTS - Ostomy and/or Continence Assessment and Care []  - 0 Incontinence Assessment and Management []  - 0 Ostomy Care Assessment and Management (repouching, etc.) PROCESS - Coordination of Care X - Simple Patient / Family Education for ongoing care 1 15 []  - 0 Complex (extensive) Patient / Family Education for ongoing care X- 1 10 Staff obtains Programmer, systems, Records, T Results / Process Orders est X- 1 10 Staff telephones HHA, Nursing Homes / Clarify orders / etc []  - 0 Routine Transfer to another Facility (non-emergent condition) []  - 0 Routine Hospital Admission (non-emergent condition) []  - 0 New Admissions / Biomedical engineer / Ordering NPWT Apligraf, etc. , []  - 0 Emergency Hospital Admission (emergent condition) X- 1 10 Simple Discharge Coordination []  - 0 Complex (extensive) Discharge Coordination PROCESS - Special Needs []  - 0 Pediatric / Minor Patient Management []  - 0 Isolation Patient Management []  - 0 Hearing / Language / Visual special needs []  - 0 Assessment of Community  assistance (transportation, D/C planning, etc.) []  - 0 Additional assistance / Altered mentation []  - 0 Support Surface(s) Assessment (bed, cushion, seat, etc.) INTERVENTIONS - Wound Cleansing /  Measurement X - Simple Wound Cleansing - one wound 1 5 []  - 0 Complex Wound Cleansing - multiple wounds X- 1 5 Wound Imaging (photographs - any number of wounds) []  - 0 Wound Tracing (instead of photographs) X- 1 5 Simple Wound Measurement - one wound []  - 0 Complex Wound Measurement - multiple wounds INTERVENTIONS - Wound Dressings X - Small Wound Dressing one or multiple wounds 1 10 []  - 0 Medium Wound Dressing one or multiple wounds []  - 0 Large Wound Dressing one or multiple wounds X- 1 5 Application of Medications - topical []  - 0 Application of Medications - injection INTERVENTIONS - Miscellaneous []  - 0 External ear exam []  - 0 Specimen Collection (cultures, biopsies, blood, body fluids, etc.) []  - 0 Specimen(s) / Culture(s) sent or taken to Lab for analysis []  - 0 Patient Transfer (multiple staff / Civil Service fast streamer / Similar devices) []  - 0 Simple Staple / Suture removal (25 or less) []  - 0 Complex Staple / Suture removal (26 or more) []  - 0 Hypo / Hyperglycemic Management (close monitor of Blood Glucose) []  - 0 Ankle / Brachial Index (ABI) - do not check if billed separately X- 1 5 Vital Signs Has the patient been seen at the hospital within the last three years: Yes Total Score: 105 Level Of Care: New/Established - Level 3 Electronic Signature(s) Signed: 01/29/2021 4:52:58 PM By: [name] Gouty RN, BSN Entered By: [name] on 01/29/2021 13:21:43 -------------------------------------------------------------------------------- Encounter Discharge Information Details Patient Name: Date of Service: [name] Petrin, [name] Sarna R. 01/29/2021 12:30 PM Medical Record Number: [id number] Patient Account Number: [id number] Date of Birth/Sex: Treating RN: Mar 06, 1922 (87 y.o. [name] Primary Care Malisha Mabey: Aretta Nip Other Clinician: Referring Millianna Szymborski: Treating Pam Vanalstine/Extender: [name], [name] in Treatment: 3 Encounter Discharge Information Items Discharge Condition: Stable Ambulatory Status: Wheelchair Discharge Destination: [place] Telephoned: No Orders Sent: Yes Transportation: Other Accompanied By: friend Schedule Follow-up Appointment: Yes Clinical Summary of Care: Patient Declined Notes facility transportation Electronic Signature(s) Signed: 01/29/2021 4:52:58 PM By: [name] Gouty RN, BSN Entered By: [name] on 01/29/2021 13:36:40 -------------------------------------------------------------------------------- Lower Extremity Assessment Details Patient Name: Date of Service: [name] Petrin, [place] 01/29/2021 12:30 PM Medical Record Number: [id number] Patient Account Number: [id number] Date of Birth/Sex: Treating RN: [birth year] (87 y.o. [name] Primary Care Delante Karapetyan: Aretta Nip Other Clinician: Referring Camerin Ladouceur: Treating Virna Livengood/Extender: Otis Brace Weeks in Treatment: 3 Edema Assessment Assessed: [Left: No] [Right: No] Edema: [Left: N] [Right: o] Calf Left: Right: Point of Measurement: 31 cm From Medial Instep 35 cm Ankle Left: Right: Point of Measurement: 9 cm From Medial Instep 23 cm Vascular Assessment Pulses: Dorsalis Pedis Palpable: [Left:Yes] Electronic Signature(s) Signed: 01/29/2021 4:52:58 PM By: [name] Gouty RN, BSN Entered By: [name] on 01/29/2021 13:02:03 -------------------------------------------------------------------------------- [place] Details Patient Name: Date of Service: [name] Petrin, [place] 01/29/2021 12:30 PM Medical Record Number: [id number] Patient Account Number: [id number] Date of Birth/Sex: Treating RN: 02/26/1923 (87 y.o. [name] Primary  Care Arsh Feutz: Aretta Nip Other Clinician: Referring Elek Holderness: Treating Kriston Mckinnie/Extender: [name], [name] in Treatment: 3 Multidisciplinary Care Plan reviewed with physician Active Inactive Abuse / Safety / Falls / Self Care Management Nursing Diagnoses: History of Falls Goals: Patient will remain injury free related to falls Date Initiated: 01/08/2021 Target Resolution Date: 02/05/2021 Goal Status: Active Interventions: Assess Activities of Daily Living upon admission and as needed Assess fall risk  on admission and as needed Provide education on fall prevention Notes: Pressure Nursing Diagnoses: Knowledge deficit related to management of pressures ulcers Goals: Patient will remain free of pressure ulcers Date Initiated: 01/08/2021 Target Resolution Date: 02/05/2021 Goal Status: Active Interventions: Assess offloading mechanisms upon admission and as needed Provide education on pressure ulcers Notes: Wound/Skin Impairment Nursing Diagnoses: Impaired tissue integrity Goals: Patient/caregiver will verbalize understanding of skin care regimen Date Initiated: 01/08/2021 Target Resolution Date: 02/05/2021 Goal Status: Active Ulcer/skin breakdown will have a volume reduction of 30% by week 4 Date Initiated: 01/08/2021 Target Resolution Date: 02/05/2021 Goal Status: Active Interventions: Assess patient/caregiver ability to obtain necessary supplies Assess patient/caregiver ability to perform ulcer/skin care regimen upon admission and as needed Assess ulceration(s) every visit Provide education on ulcer and skin care Treatment Activities: Topical wound management initiated : 01/08/2021 Notes: Electronic Signature(s) Signed: 01/29/2021 4:52:58 PM By: [name] Gouty RN, BSN Entered By: [name] on 01/29/2021 13:05:02 -------------------------------------------------------------------------------- Pain Assessment Details Patient  Name: Date of Service: [name] Petrin, [place] 01/29/2021 12:30 PM Medical Record Number: [id number] Patient Account Number: [id number] Date of Birth/Sex: Treating RN: [birth year] (87 y.o. [name] Primary Care Trini Christiansen: Aretta Nip Other Clinician: Referring Daysi Boggan: Treating Lasheena Frieze/Extender: Otis Brace Weeks in Treatment: 3 Active Problems Location of Pain Severity and Description of Pain Patient Has Paino No Site Locations Rate the pain. Current Pain Level: 0 Pain Management and Medication Current Pain Management: Electronic Signature(s) Signed: 01/29/2021 4:52:58 PM By: [name] Gouty RN, BSN Entered By: [name] on 01/29/2021 13:01:30 -------------------------------------------------------------------------------- Patient/Caregiver Education Details Patient Name: Date of Service: Jana Half 11/30/2022andnbsp12:30 PM Medical Record Number: [id number] Patient Account Number: [id number] Date of Birth/Gender: Treating RN: 08/15/1922 (87 y.o. [name] Primary Care Physician: Aretta Nip Other Clinician: Referring Physician: Treating Physician/Extender: [name], [name] in Treatment: 3 Education Assessment Education Provided To: Patient Education Topics Provided Pressure: Methods: Explain/Verbal Responses: Reinforcements needed, State content correctly Wound/Skin Impairment: Methods: Explain/Verbal Responses: Reinforcements needed, State content correctly Electronic Signature(s) Signed: 01/29/2021 4:52:58 PM By: [name] Gouty RN, BSN Entered By: [name] on 01/29/2021 13:05:24 -------------------------------------------------------------------------------- Wound Assessment Details Patient Name: Date of Service: Murtis Sink R. 01/29/2021 12:30 PM Medical Record Number: [id number] Patient Account Number: [id number] Date of Birth/Sex: Treating  RN: 1922-04-12 (87 y.o. [name] Primary Care Naida Escalante: Aretta Nip Other Clinician: Referring Yassmine Tamm: Treating Lineth Thielke/Extender: Otis Brace Weeks in Treatment: 3 Wound Status Wound Number: 1 Primary Pressure Ulcer Etiology: Wound Location: Left Calcaneus Wound Open Wounding Event: Pressure Injury Status: Date Acquired: 09/26/2020 Comorbid Cataracts, Chronic Obstructive Pulmonary Disease (COPD), Weeks Of Treatment: 3 History: Arrhythmia, Hypertension, Osteoarthritis, Received Radiation Clustered Wound: No Photos Wound Measurements Length: (cm) 1.1 Width: (cm) 1.8 Depth: (cm) 0.3 Area: (cm) 1.555 Volume: (cm) 0.467 % Reduction in Area: 58.8% % Reduction in Volume: -23.9% Epithelialization: Small (1-33%) Tunneling: No Undermining: Yes Starting Position (o'clock): 3 Ending Position (o'clock): 9 Maximum Distance: (cm) 0.2 Wound Description Classification: Unstageable/Unclassified Wound Margin: Well defined, not attached Exudate Amount: Medium Exudate Type: Serosanguineous Exudate Color: red, brown Foul Odor After Cleansing: No Slough/Fibrino Yes Wound Bed Granulation Amount: Small (1-33%) Exposed Structure Granulation Quality: Red Fascia Exposed: No Necrotic Amount: Large (67-100%) Fat Layer (Subcutaneous Tissue) Exposed: Yes Necrotic Quality: Adherent Slough Tendon Exposed: No Muscle Exposed: No Joint Exposed: No Bone Exposed: No Treatment Notes Wound #1 (Calcaneus) Wound Laterality: Left Cleanser Soap and Water Discharge Instruction: May  shower and wash wound with dial antibacterial soap and water prior to dressing change. Wound Cleanser Discharge Instruction: Cleanse the wound with wound cleanser prior to applying a clean dressing using gauze sponges, not tissue or cotton balls. Peri-Wound Care Topical Primary Dressing Iodosorb Gel 10 (gm) Tube Discharge Instruction: Apply to wound bed as  instructed Secondary Dressing Woven Gauze Sponges 2x2 in Discharge Instruction: Apply over primary dressing as directed. Zetuvit Plus Silicone Border Dressing 4x4 (in/in) Discharge Instruction: Or equivalent silicone foam border Secured With Compression Wrap Compression Stockings Add-Ons Electronic Signature(s) Signed: 01/29/2021 4:52:58 PM By: [name] Gouty RN, BSN Signed: 01/29/2021 5:26:09 PM By: Lorrin Jackson Entered By: Lorrin Jackson on 01/29/2021 13:06:10 -------------------------------------------------------------------------------- Vitals Details Patient Name: Date of Service: [name] Petrin, [place] 01/29/2021 12:30 PM Medical Record Number: [id number] Patient Account Number: [id number] Date of Birth/Sex: Treating RN: [birth year] (87 y.o. [name], Vaughan Basta Primary Care Leonell Lobdell: Aretta Nip Other Clinician: Referring Onesty Clair: Treating Chisom Muntean/Extender: Otis Brace Weeks in Treatment: 3 Vital Signs Time Taken: 13:00 Temperature (F): 97.5 Pulse (bpm): 54 Respiratory Rate (breaths/min): 18 Blood Pressure (mmHg): 152/70 Reference Range: 80 - 120 mg / dl Electronic Signature(s) Signed: 01/29/2021 4:52:58 PM By: [name] Gouty RN, BSN Entered By: [name] on 01/29/2021 13:01:00

## 2021-02-05 ENCOUNTER — Other Ambulatory Visit: Payer: Self-pay

## 2021-02-05 DIAGNOSIS — I739 Peripheral vascular disease, unspecified: Secondary | ICD-10-CM

## 2021-02-11 DIAGNOSIS — K219 Gastro-esophageal reflux disease without esophagitis: Secondary | ICD-10-CM | POA: Diagnosis not present

## 2021-02-11 DIAGNOSIS — S91301D Unspecified open wound, right foot, subsequent encounter: Secondary | ICD-10-CM | POA: Diagnosis not present

## 2021-02-11 DIAGNOSIS — E440 Moderate protein-calorie malnutrition: Secondary | ICD-10-CM | POA: Diagnosis not present

## 2021-02-11 DIAGNOSIS — J418 Mixed simple and mucopurulent chronic bronchitis: Secondary | ICD-10-CM | POA: Diagnosis not present

## 2021-02-11 DIAGNOSIS — K5900 Constipation, unspecified: Secondary | ICD-10-CM | POA: Diagnosis not present

## 2021-02-11 DIAGNOSIS — S91302D Unspecified open wound, left foot, subsequent encounter: Secondary | ICD-10-CM | POA: Diagnosis not present

## 2021-02-11 DIAGNOSIS — E039 Hypothyroidism, unspecified: Secondary | ICD-10-CM | POA: Diagnosis not present

## 2021-02-12 DIAGNOSIS — I10 Essential (primary) hypertension: Secondary | ICD-10-CM | POA: Diagnosis not present

## 2021-02-12 DIAGNOSIS — E559 Vitamin D deficiency, unspecified: Secondary | ICD-10-CM | POA: Diagnosis not present

## 2021-02-12 DIAGNOSIS — E0829 Diabetes mellitus due to underlying condition with other diabetic kidney complication: Secondary | ICD-10-CM | POA: Diagnosis not present

## 2021-02-13 DIAGNOSIS — E039 Hypothyroidism, unspecified: Secondary | ICD-10-CM | POA: Diagnosis not present

## 2021-02-13 DIAGNOSIS — N182 Chronic kidney disease, stage 2 (mild): Secondary | ICD-10-CM | POA: Diagnosis not present

## 2021-02-13 DIAGNOSIS — E440 Moderate protein-calorie malnutrition: Secondary | ICD-10-CM | POA: Diagnosis not present

## 2021-02-13 DIAGNOSIS — R770 Abnormality of albumin: Secondary | ICD-10-CM | POA: Diagnosis not present

## 2021-02-13 DIAGNOSIS — R634 Abnormal weight loss: Secondary | ICD-10-CM | POA: Diagnosis not present

## 2021-02-13 DIAGNOSIS — E673 Hypervitaminosis D: Secondary | ICD-10-CM | POA: Diagnosis not present

## 2021-02-13 NOTE — Progress Notes (Signed)
Office Note     CC: Left heel ulcer Requesting Provider:  Aretta Nip, MD  HPI: [name] is a 87 y.o. (1923/01/31) female presenting at the request of .Rankins, Bill Salinas, MD for left heel ulcer. She presents today accompanied by her advocate, [name] who has been involved in her care for quite some time.  This summer, the patient fell resulting in a right humeral spiral fracture orthopedics was consulted and recommended conservative management.  She has been receiving physical therapy at a SNF, however has had significant physical decline.  She presents today in a wheelchair, with [employer] underneath.  Physical therapy at her facility has been limited as well as time out of bed.  The foot wound was first noted several months ago.  Per Opal Sidles, the area appears to be slowly improving.  She is very happy with the wound care provided by the nursing home and has also been going to the wound care clinic. [name] denies rest pain, and requires 2 assist for [facility] for any mobility.  The pt is not on a statin for cholesterol management.  The pt is not on a daily aspirin.   Other AC:   The pt is not on medication for hypertension.   The pt is not diabetic.  Tobacco hx:  -  Past Medical History:  Diagnosis Date   Atrial fibrillation (HCC)    CARCINOMA, BREAST    CHEST PAIN    DEGENERATIVE JOINT DISEASE    Diastolic heart failure ([place]) 08/03/2008   Qualifier: Diagnosis of  By: Burnett Kanaris     Edema    GIB (gastrointestinal bleeding) 05/24/2015   HYPERTENSION    Long term (current) use of anticoagulants    Malignant neoplasm of female breast ([place]) 08/03/2008   Qualifier: Diagnosis of  By: Burnett Kanaris     Unspecified diastolic heart failure     Past Surgical History:  Procedure Laterality Date   BREAST LUMPECTOMY     ESOPHAGOGASTRODUODENOSCOPY (EGD) WITH PROPOFOL Left 05/28/2015   Procedure: ESOPHAGOGASTRODUODENOSCOPY (EGD) WITH PROPOFOL;  Surgeon: Wilford Corner, MD;  Location: [facility] ENDOSCOPY;  Service: Endoscopy;  Laterality: Left;   HIP SURGERY     KNEE SURGERY     LAMINECTOMY     TONSILLECTOMY      Social History   Socioeconomic History   Marital status: Single    Spouse name: Not on file   Number of children: Not on file   Years of education: Not on file   Highest education level: Not on file  Occupational History   Not on file  Tobacco Use   Smoking status: Never   Smokeless tobacco: Never  Vaping Use   Vaping Use: Never used  Substance and Sexual Activity   Alcohol use: No   Drug use: No   Sexual activity: Not on file  Other Topics Concern   Not on file  Social History Narrative   Not on file   Social Determinants of Health   Financial Resource Strain: Not on file  Food Insecurity: Not on file  Transportation Needs: Not on file  Physical Activity: Not on file  Stress: Not on file  Social Connections: Not on file  Intimate Partner Violence: Not on file    Family History  Problem Relation Age of Onset   CAD Mother    Heart attack Mother    Hypertension Mother    CAD Father    Stroke Brother    Heart attack  Brother     Current Outpatient Medications  Medication Sig Dispense Refill   acetaminophen (TYLENOL) 325 MG tablet Take 2 tablets (650 mg total) by mouth every 6 (six) hours as needed for moderate pain.     calcium-vitamin D (OSCAL WITH D) 500-200 MG-UNIT per tablet Take 1 tablet by mouth daily.     Cholecalciferol (VITAMIN D) 125 MCG (5000 UT) CAPS Take 5,000 Units by mouth daily.     diclofenac Sodium (VOLTAREN) 1 % GEL Apply 2 g topically 4 (four) times daily. Apply to left knee. 50 g 1   docusate sodium (COLACE) 100 MG capsule Take 100 mg by mouth daily as needed for mild constipation.     HYDROcodone-acetaminophen (NORCO/VICODIN) 5-325 MG tablet Take 1 tablet by mouth every 6 (six) hours as needed for severe pain. 10 tablet 0   Misc. Devices (POSTURE SEAT) MISC Lift chair 1 each 0   Multiple  Vitamins-Minerals (CENTRUM PO) Take 1 tablet by mouth daily.     Omega-3 Fatty Acids (FISH OIL PO) Take 1 tablet by mouth daily.     omeprazole (PRILOSEC) 20 MG capsule Take 20 mg by mouth every morning.     No current facility-administered medications for this visit.    Allergies  Allergen Reactions   Morphine And Related Other (See Comments)    Other Reaction: mild Intolerance, Allergy   Sulfa Antibiotics     Other reaction(s): Other (See Comments) Other Reaction: mild Intolerance, Allergy   Meperidine Nausea And Vomiting and Other (See Comments)   Meperidine Hcl Nausea Only   Nsaids     Other reaction(s): Other (See Comments) Bleeding ulcer   Tolmetin Other (See Comments)    Other reaction(s): Other (See Comments) Bleeding ulcer     REVIEW OF SYSTEMS:   [X]  denotes positive finding, [ ]  denotes negative finding Cardiac  Comments:  Chest pain or chest pressure:    Shortness of breath upon exertion:    Short of breath when lying flat:    Irregular heart rhythm:        Vascular    Pain in calf, thigh, or hip brought on by ambulation:    Pain in feet at night that wakes you up from your sleep:     Blood clot in your veins:    Leg swelling:         Pulmonary    Oxygen at home:    Productive cough:     Wheezing:         Neurologic    Sudden weakness in arms or legs:     Sudden numbness in arms or legs:     Sudden onset of difficulty speaking or slurred speech:    Temporary loss of vision in one eye:     Problems with dizziness:         Gastrointestinal    Blood in stool:     Vomited blood:         Genitourinary    Burning when urinating:     Blood in urine:        Psychiatric    Major depression:         Hematologic    Bleeding problems:    Problems with blood clotting too easily:        Skin    Rashes or ulcers:        Constitutional    Fever or chills:      PHYSICAL EXAMINATION:  There were no vitals  filed for this visit.  General:  WDWN in  NAD; vital signs documented above Gait: Not observed HENT: WNL, normocephalic Pulmonary: normal non-labored breathing , without wheezing Cardiac: regular HR,  Abdomen: soft, NT, no masses Skin: without rashes Vascular Exam/Pulses:  Right Left  Radial 2+ (normal) 2+ (normal)  Ulnar 2+ (normal) 2+ (normal)  Femoral    Popliteal    DP absent absent  PT absent absent   Extremities: without ischemic changes, without Gangrene , without cellulitis; with open wounds - left heal, superficial , clean  Musculoskeletal: no muscle wasting or atrophy  Neurologic: A&O X 3;  No focal weakness or paresthesias are detected Psychiatric:  The pt has Normal affect.   Non-Invasive Vascular Imaging:   ABI Findings:  +---------+------------------+-----+----------+----------------------------  --+   Right     Rt Pressure (mmHg) Index Waveform   Comment                           +---------+------------------+-----+----------+----------------------------  --+   Brachial                                      Patient refused BP,  mastectomy   +---------+------------------+-----+----------+----------------------------  --+   PTA       255                1.89  monophasic                                    +---------+------------------+-----+----------+----------------------------  --+   DP        255                1.89  monophasic                                    +---------+------------------+-----+----------+----------------------------  --+   Great Toe 85                 0.63                                                +---------+------------------+-----+----------+----------------------------  --+   +---------+------------------+-----+----------+-------+   Left      Lt Pressure (mmHg) Index Waveform   Comment   +---------+------------------+-----+----------+-------+   Brachial  135                                           +---------+------------------+-----+----------+-------+   PTA        0                  0.00                       +---------+------------------+-----+----------+-------+   PERO      255                      monophasic           +---------+------------------+-----+----------+-------+   DP  255                1.89  monophasic           +---------+------------------+-----+----------+-------+   Great Toe 102                0.76                       +---------+------------------+-----+----------+-------+     ASSESSMENT/PLAN: LARISSA PEGG is a 87 y.o. female presenting with nonhealing wound on the left heel.  ABI was reviewed demonstrating severe atherosclerotic disease bilaterally.  The location on the lateral aspect of the heel is typical of pressure induced wounds from lying in bed for an extended period of time.  Patient is nonambulatory, a Hoyer lift at baseline, and is therefore not a revascularization candidate.  I had a long conversation with both Ariea and Opal Sidles regarding the above, and how even with revascularization to heal the wound, her overall clinical status, including nonambulatory state, would not change.  Tinie would benefit from continued, aggressive wound care.  She can follow-up with me as needed with my office. Should an infection occur I would only offer above-knee amputation.  Recommend the following which can slow the progression of atherosclerosis and reduce the risk of major adverse cardiac / limb events:  Aspirin 81mg  PO QD.  Atorvastatin 40-80mg  PO QD (or other "high intensity" statin therapy). Complete cessation from all tobacco products. Blood glucose control with goal A1c < 7%. Blood pressure control with goal blood pressure < 140/90 mmHg. Lipid reduction therapy with goal LDL-C <100 mg/dL (<70 if symptomatic from PAD).    Broadus John, MD [facility] [phone]

## 2021-02-14 ENCOUNTER — Ambulatory Visit: Payer: Medicare PPO | Admitting: Vascular Surgery

## 2021-02-14 ENCOUNTER — Encounter: Payer: Self-pay | Admitting: Vascular Surgery

## 2021-02-14 ENCOUNTER — Ambulatory Visit (HOSPITAL_COMMUNITY)
Admission: RE | Admit: 2021-02-14 | Discharge: 2021-02-14 | Disposition: A | Discharge status: Home / Self Care | Payer: Medicare PPO | Source: Ambulatory Visit | Attending: Vascular Surgery | Admitting: Vascular Surgery

## 2021-02-14 ENCOUNTER — Other Ambulatory Visit: Payer: Self-pay

## 2021-02-14 VITALS — BP 132/81 | HR 53 | Temp 98.3°F | Resp 20 | Ht 67.0 in

## 2021-02-14 DIAGNOSIS — I739 Peripheral vascular disease, unspecified: Secondary | ICD-10-CM | POA: Insufficient documentation

## 2021-02-14 DIAGNOSIS — I70222 Atherosclerosis of native arteries of extremities with rest pain, left leg: Secondary | ICD-10-CM | POA: Diagnosis not present

## 2021-02-18 DIAGNOSIS — E038 Other specified hypothyroidism: Secondary | ICD-10-CM | POA: Diagnosis not present

## 2021-02-19 ENCOUNTER — Encounter (HOSPITAL_BASED_OUTPATIENT_CLINIC_OR_DEPARTMENT_OTHER): Payer: Medicare PPO | Attending: Physician Assistant | Admitting: Physician Assistant

## 2021-02-19 ENCOUNTER — Other Ambulatory Visit: Payer: Self-pay

## 2021-02-19 DIAGNOSIS — I10 Essential (primary) hypertension: Secondary | ICD-10-CM | POA: Diagnosis not present

## 2021-02-19 DIAGNOSIS — J449 Chronic obstructive pulmonary disease, unspecified: Secondary | ICD-10-CM | POA: Diagnosis not present

## 2021-02-19 DIAGNOSIS — L89620 Pressure ulcer of left heel, unstageable: Secondary | ICD-10-CM | POA: Diagnosis not present

## 2021-02-19 DIAGNOSIS — M6281 Muscle weakness (generalized): Secondary | ICD-10-CM | POA: Diagnosis not present

## 2021-02-19 DIAGNOSIS — I480 Paroxysmal atrial fibrillation: Secondary | ICD-10-CM | POA: Insufficient documentation

## 2021-02-19 DIAGNOSIS — L97422 Non-pressure chronic ulcer of left heel and midfoot with fat layer exposed: Secondary | ICD-10-CM | POA: Diagnosis not present

## 2021-02-19 NOTE — Progress Notes (Addendum)
[name], [name] ([id number]) Visit Report for 02/19/2021 Chief Complaint Document Details Patient Name: Date of Service: [name], [name] 02/19/2021 1:00 PM Medical Record Number: [id number] Patient Account Number: [id number] Date of Birth/Sex: Treating RN: [birth year] (87 y.o. [name] Primary Care Provider: Aretta Nip Other Clinician: Referring Provider: Treating Provider/Extender: Luella Cook, Flonnie Overman in Treatment: 6 Information Obtained from: Patient Chief Complaint Left heel ulcer Electronic Signature(s) Signed: 02/19/2021 1:28:04 PM By: Worthy Keeler PA-C Entered By: Worthy Keeler on 02/19/2021 13:28:04 -------------------------------------------------------------------------------- Debridement Details Patient Name: Date of Service: [name], [place]. 02/19/2021 1:00 PM Medical Record Number: [id number] Patient Account Number: [id number] Date of Birth/Sex: Treating RN: [birth year] (87 y.o. [name] Primary Care Provider: Aretta Nip Other Clinician: Referring Provider: Treating Provider/Extender: Otis Brace Weeks in Treatment: 6 Debridement Performed for Assessment: Wound #1 Left Calcaneus Performed By: Physician Worthy Keeler, PA Debridement Type: Chemical/Enzymatic/Mechanical Agent Used: Santyl Level of Consciousness (Pre-procedure): Awake and Alert Pre-procedure Verification/Time Out No Taken: Bleeding: None Response to Treatment: Procedure was tolerated well Level of Consciousness (Post- Awake and Alert procedure): Post Debridement Measurements of Total Wound Length: (cm) 1.2 Stage: Unstageable/Unclassified Width: (cm) 1.7 Depth: (cm) 0.3 Volume: (cm) 0.481 Character of Wound/Ulcer Post Debridement: Requires Further Debridement Post Procedure Diagnosis Same as Pre-procedure Electronic Signature(s) Signed: 02/19/2021 5:18:31 PM By: Worthy Keeler PA-C Signed:  02/19/2021 6:08:54 PM By: Baruch Gouty RN, BSN Entered By: Baruch Gouty on 02/19/2021 14:07:12 -------------------------------------------------------------------------------- HPI Details Patient Name: Date of Service: [name], [place]. 02/19/2021 1:00 PM Medical Record Number: [id number] Patient Account Number: [id number] Date of Birth/Sex: Treating RN: 1922/06/01 (87 y.o. [name] Primary Care Provider: Aretta Nip Other Clinician: Referring Provider: Treating Provider/Extender: Cloyd Stagers in Treatment: 6 History of Present Illness HPI Description: 01/08/2021 patient presents today for evaluation of a pressure ulcer which began after she fell in July and broke her arm. This unfortunately has led to her being overall more generally weak and she is not really up and moving around much at all. While she was in the hospital she developed the heel ulcers bilaterally the right was more deep tissue injury that pretty much appears to be healed at this point. At the current skilled nursing facility they have actually been doing a great job taking care of her according to her friend who also helps take care of her. Subsequently the patient's wound on the left heel is eschar covered and probably would benefit from clearing away this eschar so that she can actually see some improvements hopefully. Currently they have been utilizing Iodosorb along with a border foam dressing. Patient has a history of hypertension, COPD, generalized muscle weakness, and atrial fibrillation. Due to her age they elected not to place her on any blood thinners for the atrial fibrillation. 01/15/2021 upon evaluation today patient appears to be doing better in regard to the heel ulcer. I do not have initially upon evaluation today her arterial studies that were done at the facility. Therefore I did not perform any sharp debridement while she was present during the office  visit today. Nonetheless I am going to suggest based on what was seen currently that we probably once we get this with you at everything appears to be okay consider stop debridement at the next visit. 01/29/2021 upon evaluation today patient appears to be doing well with regard to her wound all things considered  her blood flow is definitely not very good based on the review of the arterial study although it was really incomplete in my opinion there was not even a brachial reading to compare to but nonetheless the readings in the extremities were very poor. In the end I did actually discuss with her going ahead and going to vascular and they have already get that appointment set up which is great news. Fortunately there does not appear to be signs of infection currently. The wound is really not making tremendous progress but again I would not expect it to do so with where things stand currently until we can get this moving a little bit better direction. 02/19/2021 upon evaluation today patient's wound actually showing signs of doing about the same the still very thick region of eschar noted currently. I do think that based on the fact that there really is nothing from a vascular standpoint recommend currently which I completely understand that this is still something that is going to require as aggressive as we can wound care obviously I am not going to perform any debridement but nonetheless I do think that good wound care including Santyl can be beneficial for the patient currently. Electronic Signature(s) Signed: 02/19/2021 2:16:15 PM By: Worthy Keeler PA-C Entered By: Worthy Keeler on 02/19/2021 14:16:14 -------------------------------------------------------------------------------- Physical Exam Details Patient Name: Date of Service: [name], [name] 02/19/2021 1:00 PM Medical Record Number: [id number] Patient Account Number: [id number] Date of Birth/Sex: Treating RN: 01/15/1923 (87  y.o. [name] Primary Care Provider: Aretta Nip Other Clinician: Referring Provider: Treating Provider/Extender: Otis Brace Weeks in Treatment: 6 Constitutional Well-nourished and well-hydrated in no acute distress. Respiratory normal breathing without difficulty. Psychiatric this patient is able to make decisions and demonstrates good insight into disease process. Alert and Oriented x 3. pleasant and cooperative. Notes Upon inspection patient's wound does show some fairly thick slough on the surface of the wound I think that Santyl is probably can be a better option for her here currently. The patient is in agreement with that plan. Electronic Signature(s) Signed: 02/19/2021 2:22:26 PM By: Worthy Keeler PA-C Entered By: Worthy Keeler on 02/19/2021 14:22:25 -------------------------------------------------------------------------------- Physician Orders Details Patient Name: Date of Service: [name], [place]. 02/19/2021 1:00 PM Medical Record Number: [id number] Patient Account Number: [id number] Date of Birth/Sex: Treating RN: 12-Jul-1922 (87 y.o. [name] Primary Care Provider: Aretta Nip Other Clinician: Referring Provider: Treating Provider/Extender: Cloyd Stagers in Treatment: 6 Verbal / Phone Orders: No Diagnosis Coding ICD-10 Coding Code Description [id number] Pressure ulcer of left heel, unstageable I10 Essential (primary) hypertension J44.9 Chronic obstructive pulmonary disease, unspecified M62.81 Muscle weakness (generalized) I48.0 Paroxysmal atrial fibrillation Follow-up Appointments Return appointment in 3 weeks. - with [facility] Shower/ Hygiene May shower and wash wound with soap and water. - with dressing change Edema Control - Lymphedema / SCD / Other Exercise regularly - exercise legs daily Moisturize legs daily. Off-Loading Turn and reposition every 2 hours -  up in chair for meals Other: - Continue to use Bunny Boots/Float Heels AT ALL TIMES Additional Orders / Instructions Follow Nutritious Diet - Continue Prostat Non Wound Condition Protect area with: - Continue foam border to right heel for protection. Wound Treatment Wound #1 - Calcaneus Wound Laterality: Left Cleanser: Soap and Water 1 x Per Day/30 Days Discharge Instructions: May shower and wash wound with dial antibacterial soap and water prior to dressing change.  Cleanser: Wound Cleanser 1 x Per Day/30 Days Discharge Instructions: Cleanse the wound with wound cleanser prior to applying a clean dressing using gauze sponges, not tissue or cotton balls. Prim Dressing: Santyl Ointment 1 x Per Day/30 Days ary Discharge Instructions: Apply nickel thick amount to wound bed as instructed Secondary Dressing: Woven Gauze Sponges 2x2 in 1 x Per Day/30 Days Discharge Instructions: Apply over primary dressing as directed. Secondary Dressing: Zetuvit Plus Silicone Border Dressing 4x4 (in/in) 1 x Per Day/30 Days Discharge Instructions: Or equivalent silicone foam border Electronic Signature(s) Signed: 02/19/2021 5:18:31 PM By: Worthy Keeler PA-C Signed: 02/19/2021 6:08:54 PM By: Baruch Gouty RN, BSN Entered By: Baruch Gouty on 02/19/2021 14:10:55 -------------------------------------------------------------------------------- Problem List Details Patient Name: Date of Service: [name], [place]. 02/19/2021 1:00 PM Medical Record Number: [id number] Patient Account Number: [id number] Date of Birth/Sex: Treating RN: 21-Mar-1922 (87 y.o. [name] Primary Care Provider: Aretta Nip Other Clinician: Referring Provider: Treating Provider/Extender: Otis Brace Weeks in Treatment: 6 Active Problems ICD-10 Encounter Code Description Active Date MDM Diagnosis L89.620 Pressure ulcer of left heel, unstageable 01/08/2021 No Yes I10 Essential (primary)  hypertension 01/08/2021 No Yes J44.9 Chronic obstructive pulmonary disease, unspecified 01/08/2021 No Yes M62.81 Muscle weakness (generalized) 01/08/2021 No Yes I48.0 Paroxysmal atrial fibrillation 01/08/2021 No Yes Inactive Problems Resolved Problems Electronic Signature(s) Signed: 02/19/2021 1:27:54 PM By: Worthy Keeler PA-C Entered By: Worthy Keeler on 02/19/2021 13:27:54 -------------------------------------------------------------------------------- Progress Note Details Patient Name: Date of Service: [name], [name]. 02/19/2021 1:00 PM Medical Record Number: [id number] Patient Account Number: [id number] Date of Birth/Sex: Treating RN: October 17, 1922 (87 y.o. [name] Primary Care Provider: Aretta Nip Other Clinician: Referring Provider: Treating Provider/Extender: Cloyd Stagers in Treatment: 6 Subjective Chief Complaint Information obtained from Patient Left heel ulcer History of Present Illness (HPI) 01/08/2021 patient presents today for evaluation of a pressure ulcer which began after she fell in July and broke her arm. This unfortunately has led to her being overall more generally weak and she is not really up and moving around much at all. While she was in the hospital she developed the heel ulcers bilaterally the right was more deep tissue injury that pretty much appears to be healed at this point. At the current skilled nursing facility they have actually been doing a great job taking care of her according to her friend who also helps take care of her. Subsequently the patient's wound on the left heel is eschar covered and probably would benefit from clearing away this eschar so that she can actually see some improvements hopefully. Currently they have been utilizing Iodosorb along with a border foam dressing. Patient has a history of hypertension, COPD, generalized muscle weakness, and atrial fibrillation. Due to her age they  elected not to place her on any blood thinners for the atrial fibrillation. 01/15/2021 upon evaluation today patient appears to be doing better in regard to the heel ulcer. I do not have initially upon evaluation today her arterial studies that were done at the facility. Therefore I did not perform any sharp debridement while she was present during the office visit today. Nonetheless I am going to suggest based on what was seen currently that we probably once we get this with you at everything appears to be okay consider stop debridement at the next visit. 01/29/2021 upon evaluation today patient appears to be doing well with regard to her wound all things considered her  blood flow is definitely not very good based on the review of the arterial study although it was really incomplete in my opinion there was not even a brachial reading to compare to but nonetheless the readings in the extremities were very poor. In the end I did actually discuss with her going ahead and going to vascular and they have already get that appointment set up which is great news. Fortunately there does not appear to be signs of infection currently. The wound is really not making tremendous progress but again I would not expect it to do so with where things stand currently until we can get this moving a little bit better direction. 02/19/2021 upon evaluation today patient's wound actually showing signs of doing about the same the still very thick region of eschar noted currently. I do think that based on the fact that there really is nothing from a vascular standpoint recommend currently which I completely understand that this is still something that is going to require as aggressive as we can wound care obviously I am not going to perform any debridement but nonetheless I do think that good wound care including Santyl can be beneficial for the patient currently. Objective Constitutional Well-nourished and well-hydrated in  no acute distress. Vitals Time Taken: 1:38 PM, Temperature: 97.6 F, Pulse: 50 bpm, Respiratory Rate: 18 breaths/min, Blood Pressure: 152/61 mmHg. Respiratory normal breathing without difficulty. Psychiatric this patient is able to make decisions and demonstrates good insight into disease process. Alert and Oriented x 3. pleasant and cooperative. General Notes: Upon inspection patient's wound does show some fairly thick slough on the surface of the wound I think that Santyl is probably can be a better option for her here currently. The patient is in agreement with that plan. Integumentary (Hair, Skin) Wound #1 status is Open. Original cause of wound was Pressure Injury. The date acquired was: 09/26/2020. The wound has been in treatment 6 weeks. The wound is located on the Left Calcaneus. The wound measures 1.2cm length x 1.7cm width x 0.3cm depth; 1.602cm^2 area and 0.481cm^3 volume. There is Fat Layer (Subcutaneous Tissue) exposed. There is no tunneling or undermining noted. There is a medium amount of serosanguineous drainage noted. The wound margin is distinct with the outline attached to the wound base. There is no granulation within the wound bed. There is a large (67-100%) amount of necrotic tissue within the wound bed including Adherent Slough. Assessment Active Problems ICD-10 Pressure ulcer of left heel, unstageable Essential (primary) hypertension Chronic obstructive pulmonary disease, unspecified Muscle weakness (generalized) Paroxysmal atrial fibrillation Procedures Wound #1 Pre-procedure diagnosis of Wound #1 is a Pressure Ulcer located on the Left Calcaneus . There was a Chemical/Enzymatic/Mechanical debridement performed by Worthy Keeler, PA.[place] Kitchen Agent used was [employer]. There was no bleeding. The procedure was tolerated well. Post Debridement Measurements: 1.2cm length x 1.7cm width x 0.3cm depth; 0.481cm^3 volume. Post debridement Stage noted as  Unstageable/Unclassified. Character of Wound/Ulcer Post Debridement requires further debridement. Post procedure Diagnosis Wound #1: Same as Pre-Procedure Plan Follow-up Appointments: Return appointment in 3 weeks. - with [facility] Shower/ Hygiene: May shower and wash wound with soap and water. - with dressing change Edema Control - Lymphedema / SCD / Other: Exercise regularly - exercise legs daily Moisturize legs daily. Off-Loading: Turn and reposition every 2 hours - up in chair for meals Other: - Continue to use Bunny Boots/Float Heels AT ALL TIMES Additional Orders / Instructions: Follow Nutritious Diet - Continue Prostat Non Wound Condition: Protect  area with: - Continue foam border to right heel for protection. WOUND #1: - Calcaneus Wound Laterality: Left Cleanser: Soap and Water 1 x Per Day/30 Days Discharge Instructions: May shower and wash wound with dial antibacterial soap and water prior to dressing change. Cleanser: Wound Cleanser 1 x Per Day/30 Days Discharge Instructions: Cleanse the wound with wound cleanser prior to applying a clean dressing using gauze sponges, not tissue or cotton balls. Prim Dressing: Santyl Ointment 1 x Per Day/30 Days ary Discharge Instructions: Apply nickel thick amount to wound bed as instructed Secondary Dressing: Woven Gauze Sponges 2x2 in 1 x Per Day/30 Days Discharge Instructions: Apply over primary dressing as directed. Secondary Dressing: Zetuvit Plus Silicone Border Dressing 4x4 (in/in) 1 x Per Day/30 Days Discharge Instructions: Or equivalent silicone foam border 1. Would recommend that we go ahead and initiate treatment with Santyl and she is in agreement with the plan. We will see how things do over the next several weeks. 2. Also can recommend covering this with a border foam dressing. That should be changed daily. 3. She should also continue with appropriate offloading. This needs to be occurring as frequently as possible she  should be in any 1 position for more than 2 hours and should be up for meals out of the bed in her chair but I do not want her to be in the chair for too long either we do not want any bedsores on the gluteal region. All this was discussed with the patient and her friend in the office today as well. We will see patient back for reevaluation in 3 weeks here in the clinic. If anything worsens or changes patient will contact our office for additional recommendations. Electronic Signature(s) Signed: 02/19/2021 2:23:16 PM By: Worthy Keeler PA-C Entered By: Worthy Keeler on 02/19/2021 14:23:16 -------------------------------------------------------------------------------- SuperBill Details Patient Name: Date of Service: [name], [name] [place] R. 02/19/2021 Medical Record Number: [id number] Patient Account Number: [id number] Date of Birth/Sex: Treating RN: 1922-10-13 (87 y.o. [name] Primary Care Provider: Aretta Nip Other Clinician: Referring Provider: Treating Provider/Extender: Cloyd Stagers in Treatment: 6 Diagnosis Coding ICD-10 Codes Code Description [phone] Pressure ulcer of left heel, unstageable I10 Essential (primary) hypertension J44.9 Chronic obstructive pulmonary disease, unspecified M62.81 Muscle weakness (generalized) I48.0 Paroxysmal atrial fibrillation Facility Procedures CPT4 Code: [id number] Description: [id number] - DEBRIDE W/O ANES NON SELECT Modifier: Quantity: 1 Physician Procedures : CPT4 Code Description Modifier [id number] - WC PHYS LEVEL 4 - EST PT ICD-10 Diagnosis Description L89.620 Pressure ulcer of left heel, unstageable I10 Essential (primary) hypertension J44.9 Chronic obstructive pulmonary disease, unspecified M62.81  Muscle weakness (generalized) Quantity: 1 Electronic Signature(s) Signed: 02/19/2021 2:23:43 PM By: Worthy Keeler PA-C Entered By: Worthy Keeler on 02/19/2021 14:23:43

## 2021-02-19 NOTE — Progress Notes (Signed)
[name] ([id number]) Visit Report for 02/19/2021 Arrival Information Details Patient Name: Date of Service: [name], [name] 02/19/2021 1:00 PM Medical Record Number: [id number] Patient Account Number: [id number] Date of Birth/Sex: Treating RN: Jul 30, 1922 (87 y.o. [name] [name]: [name] R Other Clinician: Referring [name]: Treating [name]/Extender: [name] in [name]: 6 Visit Information History Since Last Visit Added or deleted any medications: No Patient Arrived: Wheel Chair Any new allergies or adverse reactions: No Arrival Time: 13:38 Had a fall or experienced change in No Accompanied By: friend activities of daily living that may affect Transfer Assistance: Manual risk of falls: Patient Identification Verified: Yes Signs or symptoms of abuse/neglect since last visito No Secondary Verification Process Completed: Yes Hospitalized since last visit: No Patient Requires Transmission-Based Precautions: No Implantable device outside of the clinic excluding No Patient Has Alerts: Yes cellular tissue based products placed in the center Patient Alerts: Bilat ABI's NonComp since last visit: BP left arm ONLY Has Dressing in Place as Prescribed: Yes Pain Present Now: No Electronic Signature(s) Signed: 02/19/2021 6:08:54 PM By: Baruch Gouty RN, BSN Entered By: Baruch Gouty on 02/19/2021 13:38:38 -------------------------------------------------------------------------------- Encounter Discharge Information Details Patient Name: Date of Service: [name], [place]. 02/19/2021 1:00 PM Medical Record Number: [id number] Patient Account Number: [id number] Date of Birth/Sex: Treating RN: Mar 31, 1922 (87 y.o. [name] [name]: [name] Other Clinician: Referring [name]: Treating [name]/Extender: [name] in [name]:  6 Encounter Discharge Information Items Post Procedure Vitals Discharge Condition: Stable Temperature (F): 97.6 Ambulatory Status: Wheelchair Pulse (bpm): 50 Discharge Destination: [name] Respiratory Rate (breaths/min): 18 Telephoned: No Blood Pressure (mmHg): 152/61 Orders Sent: Yes Transportation: Other Accompanied By: friend Schedule Follow-up Appointment: Yes Clinical Summary of [name]: Patient Declined Notes facility transportation Electronic Signature(s) Signed: 02/19/2021 6:08:54 PM By: Baruch Gouty RN, BSN Entered By: Baruch Gouty on 02/19/2021 14:21:11 -------------------------------------------------------------------------------- Lower Extremity Assessment Details Patient Name: Date of Service: [name], [place]. 02/19/2021 1:00 PM Medical Record Number: [id number] Patient Account Number: [id number] Date of Birth/Sex: Treating RN: [birth year] (87 y.o. [name] [name]: [name] Other Clinician: Referring [name]: Treating [name]/Extender: [name]: 6 Edema Assessment Assessed: [Left: No] [Right: No] Edema: [Left: N] [Right: o] Calf Left: Right: Point of Measurement: 31 cm From Medial Instep 35 cm Ankle Left: Right: Point of Measurement: 9 cm From Medial Instep 23 cm Vascular Assessment Pulses: Dorsalis Pedis Palpable: [Left:No] Electronic Signature(s) Signed: 02/19/2021 6:08:54 PM By: Baruch Gouty RN, BSN Entered By: Baruch Gouty on 02/19/2021 13:49:25 -------------------------------------------------------------------------------- Multi-Disciplinary [name] Plan Details Patient Name: Date of Service: [name], [place]. 02/19/2021 1:00 PM Medical Record Number: [id number] Patient Account Number: [id number] Date of Birth/Sex: Treating RN: [birth year] (87 y.o. [name] [name]: [name] Other  Clinician: Referring Aydia Maj: Treating [name]/Extender: [name] in [name]: 6 Multidisciplinary [name] Plan reviewed with physician Active Inactive Abuse / Safety / Falls / Self [name] Management Nursing Diagnoses: History of Falls Goals: Patient will remain injury free related to falls Date Initiated: 01/08/2021 Target Resolution Date: 03/19/2021 Goal Status: Active Interventions: Assess Activities of Daily Living upon admission and as needed Assess fall risk on admission and as needed Provide education on fall prevention Notes: Pressure Nursing Diagnoses: Knowledge deficit related to management of pressures ulcers Goals: Patient will remain free of pressure ulcers  Date Initiated: 01/08/2021 Target Resolution Date: 03/19/2021 Goal Status: Active Interventions: Assess offloading mechanisms upon admission and as needed Provide education on pressure ulcers Notes: Wound/Skin Impairment Nursing Diagnoses: Impaired tissue integrity Goals: Patient/caregiver will verbalize understanding of skin [name] regimen Date Initiated: 01/08/2021 Target Resolution Date: 03/19/2021 Goal Status: Active Ulcer/skin breakdown will have a volume reduction of 30% by week 4 Date Initiated: 01/08/2021 Date Inactivated: 02/19/2021 Target Resolution Date: 02/05/2021 Goal Status: Unmet Unmet Reason: PAD, nonambulatory Ulcer/skin breakdown will have a volume reduction of 50% by week 8 Date Initiated: 02/19/2021 Target Resolution Date: 03/19/2021 Goal Status: Active Interventions: Assess patient/caregiver ability to obtain necessary supplies Assess patient/caregiver ability to perform ulcer/skin [name] regimen upon admission and as needed Assess ulceration(s) every visit Provide education on ulcer and skin [name] [name] Activities: Topical wound management initiated : 01/08/2021 Notes: Electronic Signature(s) Signed: 02/19/2021 6:08:54 PM By: Baruch Gouty RN,  BSN Entered By: Baruch Gouty on 02/19/2021 13:51:08 -------------------------------------------------------------------------------- Pain Assessment Details Patient Name: Date of Service: [name], [place]. 02/19/2021 1:00 PM Medical Record Number: [id number] Patient Account Number: [id number] Date of Birth/Sex: Treating RN: June 29, 1922 (87 y.o. [name] [name]: [name] Other Clinician: Referring Yurem Viner: Treating Eithel Ryall/Extender: [name] in [name]: 6 Active Problems Location of Pain Severity and Description of Pain Patient Has Paino No Site Locations Rate the pain. Rate the pain. Current Pain Level: 0 Pain Management and Medication Current Pain Management: Electronic Signature(s) Signed: 02/19/2021 6:08:54 PM By: Baruch Gouty RN, BSN Entered By: Baruch Gouty on 02/19/2021 13:39:48 -------------------------------------------------------------------------------- Patient/Caregiver Education Details Patient Name: Date of Service: [name] 12/21/2022andnbsp1:00 PM Medical Record Number: [id number] Patient Account Number: [id number] Date of Birth/Gender: Treating RN: 1922-08-28 (87 y.o. [name] Physician: [name] Other Clinician: Referring Physician: Treating Physician/Extender: [name] in [name]: 6 Education Assessment Education Provided To: Patient Education Topics Provided Pressure: Methods: Explain/Verbal Responses: Reinforcements needed, State content correctly Wound/Skin Impairment: Methods: Explain/Verbal Responses: Reinforcements needed, State content correctly Electronic Signature(s) Signed: 02/19/2021 6:08:54 PM By: Baruch Gouty RN, BSN Entered By: Baruch Gouty on 02/19/2021 13:51:46 -------------------------------------------------------------------------------- Wound Assessment  Details Patient Name: Date of Service: [name], [place]. 02/19/2021 1:00 PM Medical Record Number: [id number] Patient Account Number: [id number] Date of Birth/Sex: Treating RN: [birth year] (87 y.o. America Brown Primary [name] Constantine Ruddick: [name] Other Clinician: Referring Alfretta Pinch: Treating Tayli Buch/Extender: [name]: 6 Wound Status Wound Number: 1 Primary Pressure Ulcer Etiology: Wound Location: Left Calcaneus Wound Open Wounding Event: Pressure Injury Status: Date Acquired: 09/26/2020 Comorbid Cataracts, Chronic Obstructive Pulmonary Disease (COPD), Weeks Of [name]: 6 History: Arrhythmia, Hypertension, Osteoarthritis, Received Radiation Clustered Wound: No Photos Wound Measurements Length: (cm) 1.2 Width: (cm) 1.7 Depth: (cm) 0.3 Area: (cm) 1.602 Volume: (cm) 0.481 % Reduction in Area: 57.5% % Reduction in Volume: -27.6% Epithelialization: None Tunneling: No Undermining: No Wound Description Classification: Unstageable/Unclassified Wound Margin: Distinct, outline attached Exudate Amount: Medium Exudate Type: Serosanguineous Exudate Color: red, brown Foul Odor After Cleansing: No Slough/Fibrino Yes Wound Bed Granulation Amount: None Present (0%) Exposed Structure Necrotic Amount: Large (67-100%) Fascia Exposed: No Necrotic Quality: Adherent Slough Fat Layer (Subcutaneous Tissue) Exposed: Yes Tendon Exposed: No Muscle Exposed: No Joint Exposed: No Bone Exposed: No [name] Notes Wound #1 (Calcaneus) Wound Laterality: Left Cleanser Soap and Water Discharge Instruction: May shower and wash wound with dial antibacterial soap and water prior to dressing change.  Wound Cleanser Discharge Instruction: Cleanse the wound with wound cleanser prior to applying a clean dressing using gauze sponges, not tissue or cotton balls. Peri-Wound [name] Topical Primary Dressing Santyl Ointment Discharge  Instruction: Apply nickel thick amount to wound bed as instructed Secondary Dressing Woven Gauze Sponges 2x2 in Discharge Instruction: Apply over primary dressing as directed. Zetuvit Plus Silicone Border Dressing 4x4 (in/in) Discharge Instruction: Or equivalent silicone foam border Secured With Compression Wrap Compression Stockings Add-Ons Electronic Signature(s) Signed: 02/19/2021 5:25:22 PM By: Dellie Catholic RN Entered By: Dellie Catholic on 02/19/2021 13:45:28 -------------------------------------------------------------------------------- Vitals Details Patient Name: Date of Service: [name], [place]. 02/19/2021 1:00 PM Medical Record Number: [id number] Patient Account Number: [id number] Date of Birth/Sex: Treating RN: 03-07-1922 (87 y.o. Martyn Malay, [place] Primary [name] Chaelyn Bunyan: [name] Other Clinician: Referring Herrick Hartog: Treating Frimy Uffelman/Extender: [name]: 6 Vital Signs Time Taken: 13:38 Temperature (F): 97.6 Pulse (bpm): 50 Respiratory Rate (breaths/min): 18 Blood Pressure (mmHg): 152/61 Reference Range: 80 - 120 mg / dl Electronic Signature(s) Signed: 02/19/2021 6:08:54 PM By: Baruch Gouty RN, BSN Entered By: Baruch Gouty on 02/19/2021 13:39:03

## 2021-02-21 DIAGNOSIS — E038 Other specified hypothyroidism: Secondary | ICD-10-CM | POA: Diagnosis not present

## 2021-03-12 ENCOUNTER — Other Ambulatory Visit: Payer: Self-pay

## 2021-03-12 ENCOUNTER — Encounter (HOSPITAL_BASED_OUTPATIENT_CLINIC_OR_DEPARTMENT_OTHER): Payer: Medicare PPO | Attending: Physician Assistant | Admitting: Physician Assistant

## 2021-03-12 DIAGNOSIS — M6281 Muscle weakness (generalized): Secondary | ICD-10-CM | POA: Diagnosis not present

## 2021-03-12 DIAGNOSIS — L89620 Pressure ulcer of left heel, unstageable: Secondary | ICD-10-CM | POA: Diagnosis not present

## 2021-03-12 DIAGNOSIS — L97422 Non-pressure chronic ulcer of left heel and midfoot with fat layer exposed: Secondary | ICD-10-CM | POA: Diagnosis not present

## 2021-03-12 DIAGNOSIS — J449 Chronic obstructive pulmonary disease, unspecified: Secondary | ICD-10-CM | POA: Insufficient documentation

## 2021-03-12 DIAGNOSIS — I10 Essential (primary) hypertension: Secondary | ICD-10-CM | POA: Insufficient documentation

## 2021-03-12 DIAGNOSIS — I480 Paroxysmal atrial fibrillation: Secondary | ICD-10-CM | POA: Insufficient documentation

## 2021-03-12 NOTE — Progress Notes (Addendum)
[name], [name] ([id number]) Visit Report for 03/12/2021 Chief Complaint Document Details Patient Name: Date of Service: [name], [name] 03/12/2021 1:00 PM Medical Record Number: [id number] Patient Account Number: [id number] Date of Birth/Sex: Treating RN: 1922-08-04 (88 y.o. [name] Primary Care Provider: Aretta Nip Other Clinician: Referring Provider: Treating Provider/Extender: Cloyd Stagers in Treatment: 9 Information Obtained from: Patient Chief Complaint Left heel ulcer Electronic Signature(s) Signed: 03/12/2021 1:28:29 PM By: Worthy Keeler PA-C Entered By: Worthy Keeler on 03/12/2021 25:85:27 -------------------------------------------------------------------------------- HPI Details Patient Name: Date of Service: [name], [name]. 03/12/2021 1:00 PM Medical Record Number: [id number] Patient Account Number: [id number] Date of Birth/Sex: Treating RN: 1922/04/12 (88 y.o. [name] Primary Care Provider: Aretta Nip Other Clinician: Referring Provider: Treating Provider/Extender: Cloyd Stagers in Treatment: 9 History of Present Illness HPI Description: 01/08/2021 patient presents today for evaluation of a pressure ulcer which began after she fell in July and broke her arm. This unfortunately has led to her being overall more generally weak and she is not really up and moving around much at all. While she was in the hospital she developed the heel ulcers bilaterally the right was more deep tissue injury that pretty much appears to be healed at this point. At the current skilled nursing facility they have actually been doing a great job taking care of her according to her friend who also helps take care of her. Subsequently the patient's wound on the left heel is eschar covered and probably would benefit from clearing away this eschar so that she can actually see some improvements  hopefully. Currently they have been utilizing Iodosorb along with a border foam dressing. Patient has a history of hypertension, COPD, generalized muscle weakness, and atrial fibrillation. Due to her age they elected not to place her on any blood thinners for the atrial fibrillation. 01/15/2021 upon evaluation today patient appears to be doing better in regard to the heel ulcer. I do not have initially upon evaluation today her arterial studies that were done at the facility. Therefore I did not perform any sharp debridement while she was present during the office visit today. Nonetheless I am going to suggest based on what was seen currently that we probably once we get this with you at everything appears to be okay consider stop debridement at the next visit. 01/29/2021 upon evaluation today patient appears to be doing well with regard to her wound all things considered her blood flow is definitely not very good based on the review of the arterial study although it was really incomplete in my opinion there was not even a brachial reading to compare to but nonetheless the readings in the extremities were very poor. In the end I did actually discuss with her going ahead and going to vascular and they have already get that appointment set up which is great news. Fortunately there does not appear to be signs of infection currently. The wound is really not making tremendous progress but again I would not expect it to do so with where things stand currently until we can get this moving a little bit better direction. 02/19/2021 upon evaluation today patient's wound actually showing signs of doing about the same the still very thick region of eschar noted currently. I do think that based on the fact that there really is nothing from a vascular standpoint recommend currently which I completely understand that this is still something  that is going to require as aggressive as we can wound care obviously I am  not going to perform any debridement but nonetheless I do think that good wound care including Santyl can be beneficial for the patient currently. 03/12/2021 upon evaluation today patient appears to be doing well with regard to her wound. Fortunately there does not appear to be any signs of active infection at this time which is great news. No fevers, chills, nausea, vomiting, or diarrhea. I do feel like the [facility] is doing a good job although we need to make sure that they are put in a saline moistened gauze and behind to make sure this stays nice and moist it seems to be a little bit dry. Electronic Signature(s) Signed: 03/12/2021 1:58:48 PM By: Worthy Keeler PA-C Entered By: Worthy Keeler on 03/12/2021 13:58:48 -------------------------------------------------------------------------------- Physical Exam Details Patient Name: Date of Service: [name], [name] R. 03/12/2021 1:00 PM Medical Record Number: [id number] Patient Account Number: [id number] Date of Birth/Sex: Treating RN: 1922-08-13 (88 y.o. [name] Primary Care Provider: Aretta Nip Other Clinician: Referring Provider: Treating Provider/Extender: Otis Brace Weeks in Treatment: 9 Constitutional Well-nourished and well-hydrated in no acute distress. Respiratory normal breathing without difficulty. Psychiatric this patient is able to make decisions and demonstrates good insight into disease process. Alert and Oriented x 3. pleasant and cooperative. Notes Upon inspection patient's wound bed actually showed signs of good granulation and epithelization at this point. Fortunately there does not appear to be any evidence of active infection locally nor systemically at this time which is good news. I do believe the Santyl again is doing well. Electronic Signature(s) Signed: 03/12/2021 1:59:04 PM By: Worthy Keeler PA-C Entered By: Worthy Keeler on 03/12/2021  13:59:04 -------------------------------------------------------------------------------- Physician Orders Details Patient Name: Date of Service: [name], [place]. 03/12/2021 1:00 PM Medical Record Number: [id number] Patient Account Number: [id number] Date of Birth/Sex: Treating RN: [birth year] (88 y.o. [name] Primary Care Provider: Aretta Nip Other Clinician: Referring Provider: Treating Provider/Extender: Cloyd Stagers in Treatment: 9 Verbal / Phone Orders: No Diagnosis Coding ICD-10 Coding Code Description L89.620 Pressure ulcer of left heel, unstageable I10 Essential (primary) hypertension J44.9 Chronic obstructive pulmonary disease, unspecified M62.81 Muscle weakness (generalized) I48.0 Paroxysmal atrial fibrillation Follow-up Appointments Return appointment in 3 weeks. - with [employer] Shower/ Hygiene May shower and wash wound with soap and water. - with dressing change Edema Control - Lymphedema / SCD / Other Exercise regularly - exercise legs daily Moisturize legs daily. Off-Loading Turn and reposition every 2 hours - up in chair for meals Other: - Continue to use Bunny Boots/Float Heels AT ALL TIMES Additional Orders / Instructions Follow Nutritious Diet - Continue Prostat Non Wound Condition Protect area with: - Continue foam border to right heel for protection. Wound Treatment Wound #1 - Calcaneus Wound Laterality: Left Cleanser: Soap and Water 1 x Per Day/30 Days Discharge Instructions: May shower and wash wound with dial antibacterial soap and water prior to dressing change. Cleanser: Wound Cleanser 1 x Per Day/30 Days Discharge Instructions: Cleanse the wound with wound cleanser prior to applying a clean dressing using gauze sponges, not tissue or cotton balls. Prim Dressing: Santyl Ointment 1 x Per Day/30 Days ary Discharge Instructions: Apply nickel thick amount to wound bed as instructed Secondary  Dressing: Woven Gauze Sponges 2x2 in 1 x Per Day/30 Days Discharge Instructions: Apply saline moistened gauze over Santyl Secondary Dressing: Zetuvit  Plus Silicone Border Dressing 4x4 (in/in) 1 x Per Day/30 Days Discharge Instructions: Or equivalent silicone foam border Electronic Signature(s) Signed: 03/12/2021 4:38:38 PM By: Lorrin Jackson Signed: 03/12/2021 5:05:22 PM By: Worthy Keeler PA-C Entered By: Lorrin Jackson on 03/12/2021 13:55:43 -------------------------------------------------------------------------------- Problem List Details Patient Name: Date of Service: [name], [place]. 03/12/2021 1:00 PM Medical Record Number: [id number] Patient Account Number: [id number] Date of Birth/Sex: Treating RN: Jan 01, 1923 (88 y.o. [name] Primary Care Provider: Aretta Nip Other Clinician: Referring Provider: Treating Provider/Extender: Otis Brace Weeks in Treatment: 9 Active Problems ICD-10 Encounter Code Description Active Date MDM Diagnosis L89.620 Pressure ulcer of left heel, unstageable 01/08/2021 No Yes I10 Essential (primary) hypertension 01/08/2021 No Yes J44.9 Chronic obstructive pulmonary disease, unspecified 01/08/2021 No Yes M62.81 Muscle weakness (generalized) 01/08/2021 No Yes I48.0 Paroxysmal atrial fibrillation 01/08/2021 No Yes Inactive Problems Resolved Problems Electronic Signature(s) Signed: 03/12/2021 1:28:06 PM By: Worthy Keeler PA-C Entered By: Worthy Keeler on 03/12/2021 13:28:06 -------------------------------------------------------------------------------- Progress Note Details Patient Name: Date of Service: [name], [name] Sarna R. 03/12/2021 1:00 PM Medical Record Number: [id number] Patient Account Number: [id number] Date of Birth/Sex: Treating RN: 06-24-1922 (88 y.o. [name] Primary Care Provider: Aretta Nip Other Clinician: Referring Provider: Treating Provider/Extender: Cloyd Stagers in Treatment: 9 Subjective Chief Complaint Information obtained from Patient Left heel ulcer History of Present Illness (HPI) 01/08/2021 patient presents today for evaluation of a pressure ulcer which began after she fell in July and broke her arm. This unfortunately has led to her being overall more generally weak and she is not really up and moving around much at all. While she was in the hospital she developed the heel ulcers bilaterally the right was more deep tissue injury that pretty much appears to be healed at this point. At the current skilled nursing facility they have actually been doing a great job taking care of her according to her friend who also helps take care of her. Subsequently the patient's wound on the left heel is eschar covered and probably would benefit from clearing away this eschar so that she can actually see some improvements hopefully. Currently they have been utilizing Iodosorb along with a border foam dressing. Patient has a history of hypertension, COPD, generalized muscle weakness, and atrial fibrillation. Due to her age they elected not to place her on any blood thinners for the atrial fibrillation. 01/15/2021 upon evaluation today patient appears to be doing better in regard to the heel ulcer. I do not have initially upon evaluation today her arterial studies that were done at the facility. Therefore I did not perform any sharp debridement while she was present during the office visit today. Nonetheless I am going to suggest based on what was seen currently that we probably once we get this with you at everything appears to be okay consider stop debridement at the next visit. 01/29/2021 upon evaluation today patient appears to be doing well with regard to her wound all things considered her blood flow is definitely not very good based on the review of the arterial study although it was really incomplete in my opinion there was not  even a brachial reading to compare to but nonetheless the readings in the extremities were very poor. In the end I did actually discuss with her going ahead and going to vascular and they have already get that appointment set up which is great news. Fortunately there does not appear  to be signs of infection currently. The wound is really not making tremendous progress but again I would not expect it to do so with where things stand currently until we can get this moving a little bit better direction. 02/19/2021 upon evaluation today patient's wound actually showing signs of doing about the same the still very thick region of eschar noted currently. I do think that based on the fact that there really is nothing from a vascular standpoint recommend currently which I completely understand that this is still something that is going to require as aggressive as we can wound care obviously I am not going to perform any debridement but nonetheless I do think that good wound care including Santyl can be beneficial for the patient currently. 03/12/2021 upon evaluation today patient appears to be doing well with regard to her wound. Fortunately there does not appear to be any signs of active infection at this time which is great news. No fevers, chills, nausea, vomiting, or diarrhea. I do feel like the [facility] is doing a good job although we need to make sure that they are put in a saline moistened gauze and behind to make sure this stays nice and moist it seems to be a little bit dry. Objective Constitutional Well-nourished and well-hydrated in no acute distress. Vitals Time Taken: 1:22 PM, Temperature: 97.7 F, Pulse: 57 bpm, Respiratory Rate: 18 breaths/min, Blood Pressure: 172/68 mmHg. Respiratory normal breathing without difficulty. Psychiatric this patient is able to make decisions and demonstrates good insight into disease process. Alert and Oriented x 3. pleasant and cooperative. General Notes: Upon  inspection patient's wound bed actually showed signs of good granulation and epithelization at this point. Fortunately there does not appear to be any evidence of active infection locally nor systemically at this time which is good news. I do believe the Santyl again is doing well. Integumentary (Hair, Skin) Wound #1 status is Open. Original cause of wound was Pressure Injury. The date acquired was: 09/26/2020. The wound has been in treatment 9 weeks. The wound is located on the Left Calcaneus. The wound measures 0.9cm length x 1.8cm width x 0.3cm depth; 1.272cm^2 area and 0.382cm^3 volume. There is Fat Layer (Subcutaneous Tissue) exposed. There is no tunneling or undermining noted. There is a medium amount of serosanguineous drainage noted. The wound margin is distinct with the outline attached to the wound base. There is small (1-33%) pink granulation within the wound bed. There is a large (67-100%) amount of necrotic tissue within the wound bed including Adherent Slough. Assessment Active Problems ICD-10 Pressure ulcer of left heel, unstageable Essential (primary) hypertension Chronic obstructive pulmonary disease, unspecified Muscle weakness (generalized) Paroxysmal atrial fibrillation Plan Follow-up Appointments: Return appointment in 3 weeks. - with [facility] Shower/ Hygiene: May shower and wash wound with soap and water. - with dressing change Edema Control - Lymphedema / SCD / Other: Exercise regularly - exercise legs daily Moisturize legs daily. Off-Loading: Turn and reposition every 2 hours - up in chair for meals Other: - Continue to use Bunny Boots/Float Heels AT ALL TIMES Additional Orders / Instructions: Follow Nutritious Diet - Continue Prostat Non Wound Condition: Protect area with: - Continue foam border to right heel for protection. WOUND #1: - Calcaneus Wound Laterality: Left Cleanser: Soap and Water 1 x Per Day/30 Days Discharge Instructions: May shower and  wash wound with dial antibacterial soap and water prior to dressing change. Cleanser: Wound Cleanser 1 x Per Day/30 Days Discharge Instructions: Cleanse the wound with  wound cleanser prior to applying a clean dressing using gauze sponges, not tissue or cotton balls. Prim Dressing: Santyl Ointment 1 x Per Day/30 Days ary Discharge Instructions: Apply nickel thick amount to wound bed as instructed Secondary Dressing: Woven Gauze Sponges 2x2 in 1 x Per Day/30 Days Discharge Instructions: Apply saline moistened gauze over Santyl Secondary Dressing: Zetuvit Plus Silicone Border Dressing 4x4 (in/in) 1 x Per Day/30 Days Discharge Instructions: Or equivalent silicone foam border 1. Georgina Peer continue with the Santyl with a saline moistened gauze and behind which I think is doing an awesome job for the patient. 2. I am also can recommend that we have the patient continue with the offloading of the heel try to keep pressure off is much as possible. She needs to not stay in the bed all the time we had this on the notes as well. 3. I am also can suggest the patient continue with the Zetuvit border foam dressing to cover which will also help with some pressure relief. We will see patient back for reevaluation in 3 weeks here in the clinic. If anything worsens or changes patient will contact our office for additional recommendations. Electronic Signature(s) Signed: 03/12/2021 1:59:35 PM By: Worthy Keeler PA-C Entered By: Worthy Keeler on 03/12/2021 13:59:34 -------------------------------------------------------------------------------- SuperBill Details Patient Name: Date of Service: [name], [place]. 03/12/2021 Medical Record Number: [id number] Patient Account Number: [id number] Date of Birth/Sex: Treating RN: [birth year] (88 y.o. [name] Primary Care Provider: Aretta Nip Other Clinician: Referring Provider: Treating Provider/Extender: Cloyd Stagers  in Treatment: 9 Diagnosis Coding ICD-10 Codes Code Description [phone] Pressure ulcer of left heel, unstageable I10 Essential (primary) hypertension J44.9 Chronic obstructive pulmonary disease, unspecified M62.81 Muscle weakness (generalized) I48.0 Paroxysmal atrial fibrillation Facility Procedures CPT4 Code: [id number] Description: 99213 - WOUND CARE VISIT-LEV 3 EST PT Modifier: Quantity: 1 Physician Procedures : CPT4 Code Description Modifier [id number] - WC PHYS LEVEL 4 - EST PT ICD-10 Diagnosis Description L89.620 Pressure ulcer of left heel, unstageable I10 Essential (primary) hypertension J44.9 Chronic obstructive pulmonary disease, unspecified M62.81  Muscle weakness (generalized) Quantity: 1 Electronic Signature(s) Signed: 03/12/2021 1:59:52 PM By: Worthy Keeler PA-C Entered By: Worthy Keeler on 03/12/2021 13:59:52

## 2021-03-13 NOTE — Progress Notes (Signed)
[name] ([id number]) Visit Report for 03/12/2021 Arrival Information Details Patient Name: Date of Service: [name], [name] 03/12/2021 1:00 PM Medical Record Number: [id number] Patient Account Number: [id number] Date of Birth/Sex: Treating RN: Aug 01, 1922 (88 y.o. [name] Primary Care [name]: [name] Other Clinician: Referring [name]: [name] in Treatment: 9 Visit Information History Since Last Visit Added or deleted any medications: No Patient Arrived: Wheel Chair Any new allergies or adverse reactions: No Arrival Time: 13:22 Had a fall or experienced change in No Accompanied By: Friend activities of daily living that may affect Transfer Assistance: Manual risk of falls: Patient Identification Verified: Yes Signs or symptoms of abuse/neglect since last visito No Secondary Verification Process Completed: Yes Hospitalized since last visit: No Patient Requires Transmission-Based Precautions: No Implantable device outside of the clinic excluding No Patient Has Alerts: Yes cellular tissue based products placed in the center Patient Alerts: Bilat ABI's NonComp since last visit: BP left arm ONLY Has Dressing in Place as Prescribed: Yes Pain Present Now: No Electronic Signature(s) Signed: 03/12/2021 4:38:38 PM By: [name] Entered By: [name] on 03/12/2021 13:23:34 -------------------------------------------------------------------------------- Clinic Level of Care Assessment Details Patient Name: Date of Service: [name], [name] 03/12/2021 1:00 PM Medical Record Number: [id number] Patient Account Number: [id number] Date of Birth/Sex: Treating RN: [birth year] (88 y.o. [name] Primary Care Emalie Mcwethy: [name] Other Clinician: Referring Rawlin Reaume: Treating Latorya Bautch/Extender: [name] in Treatment: 9 Clinic Level of  Care Assessment Items TOOL 4 Quantity Score X- 1 0 Use when only an EandM is performed on FOLLOW-UP visit ASSESSMENTS - Nursing Assessment / Reassessment X- 1 10 Reassessment of Co-morbidities (includes updates in patient status) X- 1 5 Reassessment of Adherence to Treatment Plan ASSESSMENTS - Wound and Skin A ssessment / Reassessment X - Simple Wound Assessment / Reassessment - one wound 1 5 []  - 0 Complex Wound Assessment / Reassessment - multiple wounds []  - 0 Dermatologic / Skin Assessment (not related to wound area) ASSESSMENTS - Focused Assessment []  - 0 Circumferential Edema Measurements - multi extremities []  - 0 Nutritional Assessment / Counseling / Intervention []  - 0 Lower Extremity Assessment (monofilament, tuning fork, pulses) []  - 0 Peripheral Arterial Disease Assessment (using hand held doppler) ASSESSMENTS - Ostomy and/or Continence Assessment and Care []  - 0 Incontinence Assessment and Management []  - 0 Ostomy Care Assessment and Management (repouching, etc.) PROCESS - Coordination of Care []  - 0 Simple Patient / Family Education for ongoing care X- 1 20 Complex (extensive) Patient / Family Education for ongoing care []  - 0 Staff obtains Programmer, systems, Records, T Results / Process Orders est X- 1 10 Staff telephones HHA, Nursing Homes / Clarify orders / etc []  - 0 Routine Transfer to another Facility (non-emergent condition) []  - 0 Routine Hospital Admission (non-emergent condition) []  - 0 New Admissions / Biomedical engineer / Ordering NPWT Apligraf, etc. , []  - 0 Emergency Hospital Admission (emergent condition) []  - 0 Simple Discharge Coordination []  - 0 Complex (extensive) Discharge Coordination PROCESS - Special Needs []  - 0 Pediatric / Minor Patient Management []  - 0 Isolation Patient Management []  - 0 Hearing / Language / Visual special needs []  - 0 Assessment of Community assistance (transportation, D/C planning, etc.) []  -  0 Additional assistance / Altered mentation []  - 0 Support Surface(s) Assessment (bed, cushion, seat, etc.) INTERVENTIONS - Wound Cleansing / Measurement X - Simple Wound Cleansing -  one wound 1 5 []  - 0 Complex Wound Cleansing - multiple wounds X- 1 5 Wound Imaging (photographs - any number of wounds) []  - 0 Wound Tracing (instead of photographs) X- 1 5 Simple Wound Measurement - one wound []  - 0 Complex Wound Measurement - multiple wounds INTERVENTIONS - Wound Dressings []  - 0 Small Wound Dressing one or multiple wounds X- 1 15 Medium Wound Dressing one or multiple wounds []  - 0 Large Wound Dressing one or multiple wounds []  - 0 Application of Medications - topical []  - 0 Application of Medications - injection INTERVENTIONS - Miscellaneous []  - 0 External ear exam []  - 0 Specimen Collection (cultures, biopsies, blood, body fluids, etc.) []  - 0 Specimen(s) / Culture(s) sent or taken to Lab for analysis []  - 0 Patient Transfer (multiple staff / Civil Service fast streamer / Similar devices) []  - 0 Simple Staple / Suture removal (25 or less) []  - 0 Complex Staple / Suture removal (26 or more) []  - 0 Hypo / Hyperglycemic Management (close monitor of Blood Glucose) []  - 0 Ankle / Brachial Index (ABI) - do not check if billed separately X- 1 5 Vital Signs Has the patient been seen at the hospital within the last three years: Yes Total Score: 85 Level Of Care: New/Established - Level 3 Electronic Signature(s) Signed: 03/12/2021 4:38:38 PM By: [name] Entered By: [name] on 03/12/2021 13:56:47 -------------------------------------------------------------------------------- Encounter Discharge Information Details Patient Name: Date of Service: [name], [name] Sarna R. 03/12/2021 1:00 PM Medical Record Number: [id number] Patient Account Number: [id number] Date of Birth/Sex: Treating RN: 01-14-1923 (88 y.o. [name] Primary Care [name]: [name] Other  Clinician: Referring [name]: Treating [name]: [name], [name] in Treatment: 9 Encounter Discharge Information Items Discharge Condition: Stable Ambulatory Status: Wheelchair Discharge Destination: Skilled Nursing Facility Orders Sent: Yes Transportation: Other Accompanied By: Denman George Schedule Follow-up Appointment: Yes Clinical Summary of Care: Provided on 03/12/2021 Form Type Recipient Paper Patient Patient Electronic Signature(s) Signed: 03/12/2021 4:38:38 PM By: [name] Entered By: [name] on 03/12/2021 14:13:58 -------------------------------------------------------------------------------- Lower Extremity Assessment Details Patient Name: Date of Service: [name], [name] [place]. 03/12/2021 1:00 PM Medical Record Number: [id number] Patient Account Number: [id number] Date of Birth/Sex: Treating RN: 04/09/1922 (88 y.o. [name] Primary Care Yamira Papa: Aretta [name] Other Clinician: Referring Aivah Putman: Treating Ilee Randleman/Extender: Otis Brace Weeks in Treatment: 9 Edema Assessment Assessed: [Left: Yes] [Right: No] Edema: [Left: N] [Right: o] Calf Left: Right: Point of Measurement: 31 cm From Medial Instep 35.4 cm Ankle Left: Right: Point of Measurement: 9 cm From Medial Instep 23 cm Vascular Assessment Pulses: Dorsalis Pedis Palpable: [Left:No Yes] Electronic Signature(s) Signed: 03/12/2021 4:38:38 PM By: [name] Entered By: [name] on 03/12/2021 13:27:44 -------------------------------------------------------------------------------- Multi-Disciplinary Care Plan Details Patient Name: Date of Service: [name], [name] [place]. 03/12/2021 1:00 PM Medical Record Number: [id number] Patient Account Number: [id number] Date of Birth/Sex: Treating RN: August 14, 1922 (88 y.o. [name] Primary Care Ashanti Ratti: [name] Other Clinician: Referring Nashika Coker: Treating  Emaleigh Guimond/Extender: [name] in Treatment: 9 Multidisciplinary Care Plan reviewed with physician Active Inactive Abuse / Safety / Falls / Self Care Management Nursing Diagnoses: History of Falls Goals: Patient will remain injury free related to falls Date Initiated: 01/08/2021 Target Resolution Date: 03/19/2021 Goal Status: Active Interventions: Assess Activities of Daily Living upon admission and as needed Assess fall risk on admission and as needed Provide education on fall prevention Notes:  Pressure Nursing Diagnoses: Knowledge deficit related to management of pressures ulcers Goals: Patient will remain free of pressure ulcers Date Initiated: 01/08/2021 Target Resolution Date: 03/19/2021 Goal Status: Active Interventions: Assess offloading mechanisms upon admission and as needed Provide education on pressure ulcers Notes: Wound/Skin Impairment Nursing Diagnoses: Impaired tissue integrity Goals: Patient/caregiver will verbalize understanding of skin care regimen Date Initiated: 01/08/2021 Target Resolution Date: 03/19/2021 Goal Status: Active Ulcer/skin breakdown will have a volume reduction of 30% by week 4 Date Initiated: 01/08/2021 Date Inactivated: 02/19/2021 Target Resolution Date: 02/05/2021 Goal Status: Unmet Unmet Reason: PAD, nonambulatory Ulcer/skin breakdown will have a volume reduction of 50% by week 8 Date Initiated: 02/19/2021 Target Resolution Date: 03/19/2021 Goal Status: Active Interventions: Assess patient/caregiver ability to obtain necessary supplies Assess patient/caregiver ability to perform ulcer/skin care regimen upon admission and as needed Assess ulceration(s) every visit Provide education on ulcer and skin care Treatment Activities: Topical wound management initiated : 01/08/2021 Notes: Electronic Signature(s) Signed: 03/12/2021 4:38:38 PM By: [name] Entered By: [name] on 03/12/2021  13:24:54 -------------------------------------------------------------------------------- Pain Assessment Details Patient Name: Date of Service: [name], [name] [place]. 03/12/2021 1:00 PM Medical Record Number: [id number] Patient Account Number: [id number] Date of Birth/Sex: Treating RN: 1922-08-07 (88 y.o. [name] Primary Care Anthony Tamburo: [name] Other Clinician: Referring Leaman Abe: Treating Ezechiel Stooksbury/Extender: [name] in Treatment: 9 Active Problems Location of Pain Severity and Description of Pain Patient Has Paino No Site Locations Pain Management and Medication Current Pain Management: Electronic Signature(s) Signed: 03/12/2021 4:38:38 PM By: [name] Entered By: [name] on 03/12/2021 13:24:23 -------------------------------------------------------------------------------- Patient/Caregiver Education Details Patient Name: Date of Service: [name] 1/11/2023andnbsp1:00 PM Medical Record Number: [id number] Patient Account Number: [id number] Date of Birth/Gender: Treating RN: 12-08-1922 (88 y.o. [name] Primary Care Physician: [name] Other Clinician: Referring Physician: Treating Physician/Extender: [name], [name] in Treatment: 9 Education Assessment Education Provided To: Patient and Caregiver Education Topics Provided Pressure: Methods: Explain/Verbal, Printed Responses: State content correctly Wound/Skin Impairment: Methods: Explain/Verbal, Printed Responses: State content correctly Electronic Signature(s) Signed: 03/12/2021 4:38:38 PM By: [name] Entered By: [name] on 03/12/2021 13:25:20 -------------------------------------------------------------------------------- Wound Assessment Details Patient Name: Date of Service: [name], [place]. 03/12/2021 1:00 PM Medical Record Number: [id number] Patient Account Number:  [id number] Date of Birth/Sex: Treating RN: January 28, 1923 (88 y.o. [name] Primary Care Ahyan Kreeger: [name] Other Clinician: Referring Ameila Weldon: Treating Alanna Storti/Extender: [name], Bill [place] Weeks in Treatment: 9 Wound Status Wound Number: 1 Primary Pressure Ulcer Etiology: Wound Location: Left Calcaneus Wound Open Wounding Event: Pressure Injury Status: Date Acquired: 09/26/2020 Comorbid Cataracts, Chronic Obstructive Pulmonary Disease (COPD), Weeks Of Treatment: 9 History: Arrhythmia, Hypertension, Osteoarthritis, Received Radiation Clustered Wound: No Photos Wound Measurements Length: (cm) 0.9 Width: (cm) 1.8 Depth: (cm) 0.3 Area: (cm) 1.272 Volume: (cm) 0.382 % Reduction in Area: 66.3% % Reduction in Volume: -1.3% Epithelialization: None Tunneling: No Undermining: No Wound Description Classification: Unstageable/Unclassified Wound Margin: Distinct, outline attached Exudate Amount: Medium Exudate Type: Serosanguineous Exudate Color: red, brown Foul Odor After Cleansing: No Slough/Fibrino Yes Wound Bed Granulation Amount: Small (1-33%) Exposed Structure Granulation Quality: Pink Fascia Exposed: No Necrotic Amount: Large (67-100%) Fat Layer (Subcutaneous Tissue) Exposed: Yes Necrotic Quality: Adherent Slough Tendon Exposed: No Muscle Exposed: No Joint Exposed: No Bone Exposed: No Treatment Notes Wound #1 (Calcaneus) Wound Laterality: Left Cleanser Soap and Water Discharge Instruction: May shower and wash wound with dial antibacterial soap and  water prior to dressing change. Wound Cleanser Discharge Instruction: Cleanse the wound with wound cleanser prior to applying a clean dressing using gauze sponges, not tissue or cotton balls. Peri-Wound Care Topical Primary Dressing Santyl Ointment Discharge Instruction: Apply nickel thick amount to wound bed as instructed Secondary Dressing Woven Gauze Sponges 2x2 in Discharge  Instruction: Apply saline moistened gauze over Santyl Zetuvit Plus Silicone Border Dressing 4x4 (in/in) Discharge Instruction: Or equivalent silicone foam border Secured With Compression Wrap Compression Stockings Add-Ons Electronic Signature(s) Signed: 03/12/2021 4:38:38 PM By: [name] Signed: 03/13/2021 10:42:23 AM By: Sandre Kitty Entered By: Sandre Kitty on 03/12/2021 13:29:38 -------------------------------------------------------------------------------- [name] Details Patient Name: Date of Service: [name], [place]. 03/12/2021 1:00 PM Medical Record Number: [id number] Patient Account Number: [id number] Date of Birth/Sex: Treating RN: 24-Nov-1922 (88 y.o. [name] Primary Care Shiva Karis: [name] Other Clinician: Referring Ky Rumple: Treating Lomax Poehler/Extender: [name], Bill Salinas Weeks in Treatment: 9 Vital Signs Time Taken: 13:22 Temperature (F): 97.7 Pulse (bpm): 57 Respiratory Rate (breaths/min): 18 Blood Pressure (mmHg): 172/68 Reference Range: 80 - 120 mg / dl Electronic Signature(s) Signed: 03/12/2021 4:38:38 PM By: [name] Entered By: [name] on 03/12/2021 13:24:00

## 2021-03-13 NOTE — Progress Notes (Signed)
Date:  03/24/2021   [name] Date of Birth: March 09, 1922 Medical Record #[id number]  PCP:  Aretta Nip, MD  Cardiologist:  Johnsie Cancel   History of Present Illness: [name] is a 88 y.o. female who presents today for a follow up visit.    She has a history of permanent AF, chronic edema and prior GI bleed. She has had prior breast cancer treated with R lumpectomy and XRT in 2002. She is no longer on beta blocker due to pauses.   Inoperable left knee arthritis on Norco  Lack of activity contributes to edema   [place] land [place] been in family since 80's Brother Max on land with her Has a caretaker 24/7  She has been house bound for a good while with COVID She fell in July and broke her right arm then developed heel ulcers   Not on anticoagulation due to age, falls , and GI bleeds   She is fairly feeble Not able to weight bear on her own.   Past Medical History:  Diagnosis Date   Atrial fibrillation ([place])    CARCINOMA, BREAST    CHEST PAIN    DEGENERATIVE JOINT DISEASE    Diastolic heart failure ([place]) 08/03/2008   Qualifier: Diagnosis of  By: Burnett Kanaris     Edema    GIB (gastrointestinal bleeding) 05/24/2015   HYPERTENSION    Long term (current) use of anticoagulants    Malignant neoplasm of female breast ([place]) 08/03/2008   Qualifier: Diagnosis of  By: Burnett Kanaris     Unspecified diastolic heart failure     Past Surgical History:  Procedure Laterality Date   BREAST LUMPECTOMY     ESOPHAGOGASTRODUODENOSCOPY (EGD) WITH PROPOFOL Left 05/28/2015   Procedure: ESOPHAGOGASTRODUODENOSCOPY (EGD) WITH PROPOFOL;  Surgeon: Wilford Corner, MD;  Location: [facility] ENDOSCOPY;  Service: Endoscopy;  Laterality: Left;   HIP SURGERY     KNEE SURGERY     LAMINECTOMY     TONSILLECTOMY       Medications: Current Meds  Medication Sig   acetaminophen (TYLENOL) 325 MG tablet Take 2 tablets (650 mg total) by mouth every 6 (six) hours as needed for  moderate pain.   calcium-vitamin D (OSCAL WITH D) 500-200 MG-UNIT per tablet Take 1 tablet by mouth daily.   Cholecalciferol (VITAMIN D) 125 MCG (5000 UT) CAPS Take 5,000 Units by mouth daily.   diclofenac Sodium (VOLTAREN) 1 % GEL Apply 2 g topically 4 (four) times daily. Apply to left knee.   docusate sodium (COLACE) 100 MG capsule Take 100 mg by mouth daily as needed for mild constipation.   HYDROcodone-acetaminophen (NORCO/VICODIN) 5-325 MG tablet Take 1 tablet by mouth every 6 (six) hours as needed for severe pain.   Misc. Devices (POSTURE SEAT) MISC Lift chair   Multiple Vitamins-Minerals (CENTRUM PO) Take 1 tablet by mouth daily.   Omega-3 Fatty Acids (FISH OIL PO) Take 1 tablet by mouth daily.   omeprazole (PRILOSEC) 20 MG capsule Take 20 mg by mouth every morning.     Allergies: Allergies  Allergen Reactions   Morphine And Related Other (See Comments)    Other Reaction: mild Intolerance, Allergy   Sulfa Antibiotics     Other reaction(s): Other (See Comments) Other Reaction: mild Intolerance, Allergy   Meperidine Nausea And Vomiting and Other (See Comments)   Meperidine Hcl Nausea Only   Nsaids     Other reaction(s): Other (See Comments) Bleeding ulcer   Tolmetin  Other (See Comments)    Other reaction(s): Other (See Comments) Bleeding ulcer    Social History: The patient  reports that she has never smoked. She has never used smokeless tobacco. She reports that she does not drink alcohol and does not use drugs.   Family History: The patient's family history includes CAD in her father and mother; Heart attack in her brother and mother; Hypertension in her mother; Stroke in her brother.   Review of Systems: Please see the history of present illness.   Otherwise, the review of systems is positive for none.   All other systems are reviewed and negative.   Physical Exam: VS:  BP 100/78    Pulse (!) 50    Ht 5\' 7"  (1.702 m)    Wt 188 lb (85.3 kg) Comment: last record weight  at [facility] stone   SpO2 100%    BMI 29.44 kg/m  .  BMI Body mass index is 29.44 kg/m.  Wt Readings from Last 3 Encounters:  03/24/21 188 lb (85.3 kg)  09/12/20 229 lb 15 oz (104.3 kg)  02/16/20 230 lb (104.3 kg)    Telephone no exam   LABORATORY DATA:  EKG:   Flutter rate 48 LVH inferior lateral T wave changes 11/11/18   Lab Results  Component Value Date   WBC 7.5 09/21/2020   HGB 10.7 (L) 09/21/2020   HCT 33.1 (L) 09/21/2020   PLT 328 09/21/2020   GLUCOSE 89 09/21/2020   CHOL 89 05/25/2015   TRIG 195 (H) 05/25/2015   HDL 21 (L) 05/25/2015   LDLDIRECT 139.5 10/25/2008   LDLCALC 29 05/25/2015   ALT 16 05/24/2015   AST 26 05/24/2015   NA 139 09/21/2020   K 3.6 09/21/2020   CL 101 09/21/2020   CREATININE 1.01 (H) 09/21/2020   BUN 39 (H) 09/21/2020   CO2 28 09/21/2020   TSH 4.32 11/13/2015   INR 4.1 06/24/2015   HGBA1C 5.9 (H) 05/25/2015     BNP (last 3 results) No results for input(s): BNP in the last 8760 hours.  ProBNP (last 3 results) No results for input(s): PROBNP in the last 8760 hours.   Other Studies Reviewed Today:  Echo Study Conclusions 2015  - Left ventricle: The cavity size was normal. Wall thickness was   increased in a pattern of mild LVH. Systolic function was normal.   The estimated ejection fraction was in the range of 60% to 65%.   Wall motion was normal; there were no regional wall motion   abnormalities. - Aortic valve: There was trivial regurgitation. - Left atrium: The atrium was severely dilated.  Assessment/Plan:  1. Pesistent atrial fibrillation:  CHASDSVASC at least 5. Coumadin stopped In April 2017 due to GI bleed, age and history of falls. Beta blocker d/c due to AV nodal dx rates ok Given age would not pursue Watchman FLX evaluation   2. Chronic LE edema: continue diuretic and elevation when possible low sodium diet    3. HTN: Well controlled.  Continue current medications and low sodium Dash type diet.    4. Prior GI  bleeding she has had prior EGD showing antral ulcers due to NSAIDs. No anticoagulation   5. Ortho:  Chronic left knee arthritis currently Rx Norco limits activity   6. Wound Care:  heel ulcers f/u Jeri Cos PA C     Current medicines are reviewed with the patient today.  The patient does not have concerns regarding medicines other than what has been  noted above.  The following changes have been made:  See above.  Labs/ tests ordered today include:   None   Time :; spent reviewing chart prior echo direct patient interview and composing note 20 minutes  Disposition:   FU in a year   Patient is agreeable to this plan and will call if any problems develop in the interim.   Signed: Jenkins Rouge, MD  03/24/2021 9:59 AM  [place] [address] [place], [place]  [address] Phone: [phone] Fax: [phone]

## 2021-03-19 DIAGNOSIS — M179 Osteoarthritis of knee, unspecified: Secondary | ICD-10-CM | POA: Diagnosis not present

## 2021-03-19 DIAGNOSIS — M6281 Muscle weakness (generalized): Secondary | ICD-10-CM | POA: Diagnosis not present

## 2021-03-24 ENCOUNTER — Ambulatory Visit (INDEPENDENT_AMBULATORY_CARE_PROVIDER_SITE_OTHER): Payer: Medicare PPO | Admitting: Cardiovascular Disease

## 2021-03-24 ENCOUNTER — Encounter: Payer: Self-pay | Admitting: Cardiovascular Disease

## 2021-03-24 ENCOUNTER — Other Ambulatory Visit: Payer: Self-pay

## 2021-03-24 VITALS — BP 100/78 | HR 50 | Ht 67.0 in | Wt 188.0 lb

## 2021-03-24 DIAGNOSIS — R609 Edema, unspecified: Secondary | ICD-10-CM | POA: Diagnosis not present

## 2021-03-24 DIAGNOSIS — I739 Peripheral vascular disease, unspecified: Secondary | ICD-10-CM

## 2021-03-24 DIAGNOSIS — I4811 Longstanding persistent atrial fibrillation: Secondary | ICD-10-CM | POA: Diagnosis not present

## 2021-03-24 NOTE — Patient Instructions (Addendum)
Medication Instructions:   Your physician recommends that you continue on your current medications as directed. Please refer to the Current Medication list given to you today.  *If you need a refill on your cardiac medications before your next appointment, please call your pharmacy*  Lab Work: If you have labs (blood work) drawn today and your tests are completely normal, you will receive your results only by: [facility] (if you have MyChart) OR A paper copy in the mail If you have any lab test that is abnormal or we need to change your treatment, we will call you to review the results.  Testing/Procedures: None ordered today.  Follow-Up: At [facility], you and your health needs are our priority.  As part of our continuing mission to provide you with exceptional heart care, we have created designated Provider Care Teams.  These Care Teams include your primary Cardiologist (physician) and Advanced Practice Providers (APPs -  Physician Assistants and Nurse Practitioners) who all work together to provide you with the care you need, when you need it.  We recommend signing up for the patient portal called "MyChart".  Sign up information is provided on this After Visit Summary.  MyChart is used to connect with patients for Virtual Visits (Telemedicine).  Patients are able to view lab/test results, encounter notes, upcoming appointments, etc.  Non-urgent messages can be sent to your provider as well.   To learn more about what you can do with MyChart, go to [email or web address].    Your next appointment:   As needed  The format for your next appointment:   In Person  Provider:   Jenkins Rouge, MD

## 2021-03-31 DIAGNOSIS — Z87440 Personal history of urinary (tract) infections: Secondary | ICD-10-CM | POA: Diagnosis not present

## 2021-03-31 DIAGNOSIS — Z8679 Personal history of other diseases of the circulatory system: Secondary | ICD-10-CM | POA: Diagnosis not present

## 2021-03-31 DIAGNOSIS — R062 Wheezing: Secondary | ICD-10-CM | POA: Diagnosis not present

## 2021-03-31 DIAGNOSIS — Z8709 Personal history of other diseases of the respiratory system: Secondary | ICD-10-CM | POA: Diagnosis not present

## 2021-03-31 DIAGNOSIS — R0602 Shortness of breath: Secondary | ICD-10-CM | POA: Diagnosis not present

## 2021-03-31 DIAGNOSIS — R300 Dysuria: Secondary | ICD-10-CM | POA: Diagnosis not present

## 2021-04-01 DIAGNOSIS — E559 Vitamin D deficiency, unspecified: Secondary | ICD-10-CM | POA: Diagnosis not present

## 2021-04-01 DIAGNOSIS — N399 Disorder of urinary system, unspecified: Secondary | ICD-10-CM | POA: Diagnosis not present

## 2021-04-01 DIAGNOSIS — N390 Urinary tract infection, site not specified: Secondary | ICD-10-CM | POA: Diagnosis not present

## 2021-04-01 DIAGNOSIS — E200 Idiopathic hypoparathyroidism: Secondary | ICD-10-CM | POA: Diagnosis not present

## 2021-04-01 DIAGNOSIS — J449 Chronic obstructive pulmonary disease, unspecified: Secondary | ICD-10-CM | POA: Diagnosis not present

## 2021-04-01 DIAGNOSIS — I5030 Unspecified diastolic (congestive) heart failure: Secondary | ICD-10-CM | POA: Diagnosis not present

## 2021-04-01 DIAGNOSIS — E0829 Diabetes mellitus due to underlying condition with other diabetic kidney complication: Secondary | ICD-10-CM | POA: Diagnosis not present

## 2021-04-02 ENCOUNTER — Encounter (HOSPITAL_BASED_OUTPATIENT_CLINIC_OR_DEPARTMENT_OTHER): Payer: Medicare PPO | Attending: Physician Assistant | Admitting: Physician Assistant

## 2021-04-02 ENCOUNTER — Other Ambulatory Visit: Payer: Self-pay

## 2021-04-02 DIAGNOSIS — I480 Paroxysmal atrial fibrillation: Secondary | ICD-10-CM | POA: Diagnosis not present

## 2021-04-02 DIAGNOSIS — L89620 Pressure ulcer of left heel, unstageable: Secondary | ICD-10-CM | POA: Insufficient documentation

## 2021-04-02 DIAGNOSIS — J449 Chronic obstructive pulmonary disease, unspecified: Secondary | ICD-10-CM | POA: Diagnosis not present

## 2021-04-02 DIAGNOSIS — L97422 Non-pressure chronic ulcer of left heel and midfoot with fat layer exposed: Secondary | ICD-10-CM | POA: Diagnosis not present

## 2021-04-02 DIAGNOSIS — I10 Essential (primary) hypertension: Secondary | ICD-10-CM | POA: Diagnosis not present

## 2021-04-02 NOTE — Progress Notes (Signed)
LOWELL, MCGURK ([id number]) Visit Report for 04/02/2021 Chief Complaint Document Details Patient Name: Date of Service: [name], [name] 04/02/2021 1:00 PM Medical Record Number: [id number] Patient Account Number: [id number] Date of Birth/Sex: Treating RN: [birth year] (88 y.o. [name] Primary Care Provider: Aretta Nip Other Clinician: Referring Provider: Treating Provider/Extender: Cloyd Stagers in Treatment: 12 Information Obtained from: Patient Chief Complaint Left heel ulcer Electronic Signature(s) Signed: 04/02/2021 1:33:25 PM By: Worthy Keeler PA-C Entered By: Worthy Keeler on 04/02/2021 13:33:25 -------------------------------------------------------------------------------- Problem List Details Patient Name: Date of Service: [name], [place] 04/02/2021 1:00 PM Medical Record Number: [id number] Patient Account Number: [id number] Date of Birth/Sex: Treating RN: Mar 11, 1922 (88 y.o. [name] Primary Care Provider: Aretta Nip Other Clinician: Referring Provider: Treating Provider/Extender: Cloyd Stagers in Treatment: 12 Active Problems ICD-10 Encounter Code Description Active Date MDM Diagnosis L89.620 Pressure ulcer of left heel, unstageable 01/08/2021 No Yes I10 Essential (primary) hypertension 01/08/2021 No Yes J44.9 Chronic obstructive pulmonary disease, unspecified 01/08/2021 No Yes M62.81 Muscle weakness (generalized) 01/08/2021 No Yes I48.0 Paroxysmal atrial fibrillation 01/08/2021 No Yes Inactive Problems Resolved Problems Electronic Signature(s) Signed: 04/02/2021 1:33:16 PM By: Worthy Keeler PA-C Entered By: Worthy Keeler on 04/02/2021 13:33:16

## 2021-04-02 NOTE — Progress Notes (Signed)
MORAYO, LEVEN ([id number]) Visit Report for 04/02/2021 Arrival Information Details Patient Name: Date of Service: JISELLA, ASHENFELTER 04/02/2021 1:00 PM Medical Record Number: [id number] Patient Account Number: [id number] Date of Birth/Sex: Treating RN: 1922-08-11 (88 y.o. [name] Primary Care Elah Avellino: Aretta Nip Other Clinician: Referring Rohn Fritsch: Treating Scarleth Brame/Extender: Cloyd Stagers in Treatment: 12 Visit Information History Since Last Visit Added or deleted any medications: No Patient Arrived: Wheel Chair Any new allergies or adverse reactions: No Arrival Time: 13:22 Had a fall or experienced change in No Accompanied By: friend activities of daily living that may affect Transfer Assistance: Manual risk of falls: Patient Identification Verified: Yes Signs or symptoms of abuse/neglect since last visito No Secondary Verification Process Completed: Yes Hospitalized since last visit: No Patient Requires Transmission-Based Precautions: No Implantable device outside of the clinic excluding No Patient Has Alerts: Yes cellular tissue based products placed in the center Patient Alerts: Bilat ABI's NonComp since last visit: BP left arm ONLY Has Dressing in Place as Prescribed: Yes Pain Present Now: No Electronic Signature(s) Signed: 04/02/2021 4:41:08 PM By: Lorrin Jackson Entered By: Lorrin Jackson on 04/02/2021 13:22:47 -------------------------------------------------------------------------------- Encounter Discharge Information Details Patient Name: Date of Service: [name], [name] 04/02/2021 1:00 PM Medical Record Number: [id number] Patient Account Number: [id number] Date of Birth/Sex: Treating RN: 10-11-1922 (88 y.o. [name] Primary Care Zanobia Griebel: Aretta Nip Other Clinician: Referring Kamrin Sibley: Treating [place] Mokry/Extender: Cloyd Stagers in Treatment: 12 Encounter  Discharge Information Items Post Procedure Vitals Discharge Condition: Stable Temperature (F): 97.6 Ambulatory Status: Wheelchair Pulse (bpm): 76 Discharge Destination: Home Respiratory Rate (breaths/min): 18 Transportation: Private Auto Blood Pressure (mmHg): 140/78 Accompanied By: Denman George Schedule Follow-up Appointment: Yes Clinical Summary of Care: Provided on 04/02/2021 Form Type Recipient Paper Patient Patient/Facility Electronic Signature(s) Signed: 04/02/2021 2:00:15 PM By: Lorrin Jackson Entered By: Lorrin Jackson on 04/02/2021 14:00:15 -------------------------------------------------------------------------------- Lower Extremity Assessment Details Patient Name: Date of Service: IVONNE, [place] 04/02/2021 1:00 PM Medical Record Number: [id number] Patient Account Number: [id number] Date of Birth/Sex: Treating RN: 01/16/1923 (88 y.o. [name] Primary Care Nandika Stetzer: Aretta Nip Other Clinician: Referring Emmanuell Kantz: Treating Moni Rothrock/Extender: Otis Brace Weeks in Treatment: 12 Edema Assessment Assessed: [Left: Yes] [Right: No] Edema: [Left: N] [Right: o] Calf Left: Right: Point of Measurement: 31 cm From Medial Instep 35 cm Ankle Left: Right: Point of Measurement: 9 cm From Medial Instep 23 cm Vascular Assessment Pulses: Dorsalis Pedis Palpable: [Left:Yes] Electronic Signature(s) Signed: 04/02/2021 4:41:08 PM By: Lorrin Jackson Entered By: Lorrin Jackson on 04/02/2021 13:27:57 -------------------------------------------------------------------------------- Multi-Disciplinary Care Plan Details Patient Name: Date of Service: [name], [place] 04/02/2021 1:00 PM Medical Record Number: [id number] Patient Account Number: [id number] Date of Birth/Sex: Treating RN: November 30, 1922 (88 y.o. [name] Primary Care Dymphna Wadley: Aretta Nip Other Clinician: Referring Khary Schaben: Treating Julianah Marciel/Extender: Luella Cook, Flonnie Overman in Treatment: 12 Multidisciplinary Care Plan reviewed with physician Active Inactive Abuse / Safety / Falls / Self Care Management Nursing Diagnoses: History of Falls Goals: Patient will remain injury free related to falls Date Initiated: 01/08/2021 Target Resolution Date: 04/30/2021 Goal Status: Active Interventions: Assess Activities of Daily Living upon admission and as needed Assess fall risk on admission and as needed Provide education on fall prevention Notes: 04/02/21: No falls but continues to be a fall risk. Pressure Nursing Diagnoses: Knowledge deficit related to management of pressures ulcers Goals: Patient will remain free of  pressure ulcers Date Initiated: 01/08/2021 Target Resolution Date: 04/30/2021 Goal Status: Active Interventions: Assess offloading mechanisms upon admission and as needed Provide education on pressure ulcers Notes: Wound/Skin Impairment Nursing Diagnoses: Impaired tissue integrity Goals: Patient/caregiver will verbalize understanding of skin care regimen Date Initiated: 01/08/2021 Target Resolution Date: 04/30/2021 Goal Status: Active Ulcer/skin breakdown will have a volume reduction of 30% by week 4 Date Initiated: 01/08/2021 Date Inactivated: 02/19/2021 Target Resolution Date: 02/05/2021 Goal Status: Unmet Unmet Reason: PAD, nonambulatory Ulcer/skin breakdown will have a volume reduction of 50% by week 8 Date Initiated: 02/19/2021 Target Resolution Date: 04/09/2021 Goal Status: Active Interventions: Assess patient/caregiver ability to obtain necessary supplies Assess patient/caregiver ability to perform ulcer/skin care regimen upon admission and as needed Assess ulceration(s) every visit Provide education on ulcer and skin care Treatment Activities: Topical wound management initiated : 01/08/2021 Notes: 04/02/21: Wound care regimen continues Electronic Signature(s) Signed: 04/02/2021 4:41:08 PM By: Lorrin Jackson Entered By: Lorrin Jackson on 04/02/2021 13:37:11 -------------------------------------------------------------------------------- Pain Assessment Details Patient Name: Date of Service: [name], [place]. 04/02/2021 1:00 PM Medical Record Number: [id number] Patient Account Number: [id number] Date of Birth/Sex: Treating RN: 05/07/1922 (88 y.o. [name] Primary Care Meline Russaw: Aretta Nip Other Clinician: Referring Mclean Moya: Treating Veria Stradley/Extender: Cloyd Stagers in Treatment: 12 Active Problems Location of Pain Severity and Description of Pain Patient Has Paino No Site Locations Pain Management and Medication Current Pain Management: Electronic Signature(s) Signed: 04/02/2021 4:41:08 PM By: Lorrin Jackson Entered By: Lorrin Jackson on 04/02/2021 13:28:11 -------------------------------------------------------------------------------- Patient/Caregiver Education Details Patient Name: Date of Service: Jana Half 2/1/2023andnbsp1:00 PM Medical Record Number: [id number] Patient Account Number: [id number] Date of Birth/Gender: Treating RN: [birth year] (88 y.o. [name] Primary Care Physician: Aretta Nip Other Clinician: Referring Physician: Treating Physician/Extender: Luella Cook, Flonnie Overman in Treatment: 12 Education Assessment Education Provided To: Patient and Caregiver Education Topics Provided Offloading: Methods: Explain/Verbal, Printed Responses: State content correctly Wound/Skin Impairment: Methods: Explain/Verbal, Printed Responses: State content correctly Electronic Signature(s) Signed: 04/02/2021 4:41:08 PM By: Lorrin Jackson Entered By: Lorrin Jackson on 04/02/2021 13:18:24 -------------------------------------------------------------------------------- Wound Assessment Details Patient Name: Date of Service: [name], [place] 04/02/2021 1:00 PM Medical Record  Number: [id number] Patient Account Number: [id number] Date of Birth/Sex: Treating RN: September 07, 1922 (88 y.o. [name] Primary Care Inioluwa Baris: Aretta Nip Other Clinician: Referring Stephanos Fan: Treating Jaegar Croft/Extender: Otis Brace Weeks in Treatment: 12 Wound Status Wound Number: 1 Primary Pressure Ulcer Etiology: Wound Location: Left Calcaneus Wound Open Wounding Event: Pressure Injury Status: Date Acquired: 09/26/2020 Comorbid Cataracts, Chronic Obstructive Pulmonary Disease (COPD), Weeks Of Treatment: 12 History: Arrhythmia, Hypertension, Osteoarthritis, Received Radiation Clustered Wound: No Photos Wound Measurements Length: (cm) 1 Width: (cm) 1.3 Depth: (cm) 0.3 Area: (cm) 1.021 Volume: (cm) 0.306 % Reduction in Area: 72.9% % Reduction in Volume: 18.8% Epithelialization: Medium (34-66%) Tunneling: No Undermining: No Wound Description Classification: Unstageable/Unclassified Wound Margin: Distinct, outline attached Exudate Amount: Medium Exudate Type: Serosanguineous Exudate Color: red, brown Foul Odor After Cleansing: No Slough/Fibrino Yes Wound Bed Granulation Amount: Small (1-33%) Exposed Structure Granulation Quality: Pink Fascia Exposed: No Necrotic Amount: Large (67-100%) Fat Layer (Subcutaneous Tissue) Exposed: Yes Necrotic Quality: Adherent Slough Tendon Exposed: No Muscle Exposed: No Joint Exposed: No Bone Exposed: No Treatment Notes Wound #1 (Calcaneus) Wound Laterality: Left Cleanser Soap and Water Discharge Instruction: May shower and wash wound with dial antibacterial soap and water prior to dressing change. Wound Cleanser Discharge Instruction: Cleanse the  wound with wound cleanser prior to applying a clean dressing using gauze sponges, not tissue or cotton balls. Peri-Wound Care Topical Primary Dressing Santyl Ointment Discharge Instruction: Apply nickel thick amount to wound bed as  instructed Secondary Dressing Woven Gauze Sponges 2x2 in Discharge Instruction: Apply saline moistened gauze over Santyl Zetuvit Plus Silicone Border Dressing 4x4 (in/in) Discharge Instruction: Or equivalent silicone foam border Secured With Compression Wrap Compression Stockings Add-Ons Electronic Signature(s) Signed: 04/02/2021 4:41:08 PM By: Lorrin Jackson Entered By: Lorrin Jackson on 04/02/2021 13:27:34 -------------------------------------------------------------------------------- [place] Details Patient Name: Date of Service: [name], [place] 04/02/2021 1:00 PM Medical Record Number: [id number] Patient Account Number: [id number] Date of Birth/Sex: Treating RN: 20-Nov-1922 (88 y.o. [name] Primary Care Reece Fehnel: Aretta Nip Other Clinician: Referring Rieley Hausman: Treating Aneudy Champlain/Extender: Luella Cook, Bill [place] Weeks in Treatment: 12 Vital Signs Time Taken: 13:23 Temperature (F): 97.6 Pulse (bpm): 76 Respiratory Rate (breaths/min): 18 Blood Pressure (mmHg): 140/78 Reference Range: 80 - 120 mg / dl Electronic Signature(s) Signed: 04/02/2021 4:41:08 PM By: Lorrin Jackson Entered By: Lorrin Jackson on 04/02/2021 01:41:03

## 2021-04-04 DIAGNOSIS — N182 Chronic kidney disease, stage 2 (mild): Secondary | ICD-10-CM | POA: Diagnosis not present

## 2021-04-04 DIAGNOSIS — Z87440 Personal history of urinary (tract) infections: Secondary | ICD-10-CM | POA: Diagnosis not present

## 2021-04-04 DIAGNOSIS — E860 Dehydration: Secondary | ICD-10-CM | POA: Diagnosis not present

## 2021-04-14 DIAGNOSIS — R600 Localized edema: Secondary | ICD-10-CM | POA: Diagnosis not present

## 2021-04-14 DIAGNOSIS — M62838 Other muscle spasm: Secondary | ICD-10-CM | POA: Diagnosis not present

## 2021-04-14 DIAGNOSIS — M79662 Pain in left lower leg: Secondary | ICD-10-CM | POA: Diagnosis not present

## 2021-04-14 DIAGNOSIS — I5032 Chronic diastolic (congestive) heart failure: Secondary | ICD-10-CM | POA: Diagnosis not present

## 2021-04-15 DIAGNOSIS — R2241 Localized swelling, mass and lump, right lower limb: Secondary | ICD-10-CM | POA: Diagnosis not present

## 2021-04-16 DIAGNOSIS — I10 Essential (primary) hypertension: Secondary | ICD-10-CM | POA: Diagnosis not present

## 2021-04-16 DIAGNOSIS — Z79899 Other long term (current) drug therapy: Secondary | ICD-10-CM | POA: Diagnosis not present

## 2021-04-16 DIAGNOSIS — E038 Other specified hypothyroidism: Secondary | ICD-10-CM | POA: Diagnosis not present

## 2021-04-16 DIAGNOSIS — E569 Vitamin deficiency, unspecified: Secondary | ICD-10-CM | POA: Diagnosis not present

## 2021-04-16 DIAGNOSIS — I5022 Chronic systolic (congestive) heart failure: Secondary | ICD-10-CM | POA: Diagnosis not present

## 2021-04-17 DIAGNOSIS — N390 Urinary tract infection, site not specified: Secondary | ICD-10-CM | POA: Diagnosis not present

## 2021-04-18 DIAGNOSIS — N390 Urinary tract infection, site not specified: Secondary | ICD-10-CM | POA: Diagnosis not present

## 2021-04-18 DIAGNOSIS — M79662 Pain in left lower leg: Secondary | ICD-10-CM | POA: Diagnosis not present

## 2021-04-18 DIAGNOSIS — R600 Localized edema: Secondary | ICD-10-CM | POA: Diagnosis not present

## 2021-04-18 DIAGNOSIS — M62838 Other muscle spasm: Secondary | ICD-10-CM | POA: Diagnosis not present

## 2021-04-18 DIAGNOSIS — I5032 Chronic diastolic (congestive) heart failure: Secondary | ICD-10-CM | POA: Diagnosis not present

## 2021-04-21 DIAGNOSIS — Z7689 Persons encountering health services in other specified circumstances: Secondary | ICD-10-CM | POA: Diagnosis not present

## 2021-04-21 DIAGNOSIS — Z87440 Personal history of urinary (tract) infections: Secondary | ICD-10-CM | POA: Diagnosis not present

## 2021-04-21 DIAGNOSIS — N390 Urinary tract infection, site not specified: Secondary | ICD-10-CM | POA: Diagnosis not present

## 2021-04-23 ENCOUNTER — Encounter (HOSPITAL_BASED_OUTPATIENT_CLINIC_OR_DEPARTMENT_OTHER): Payer: Medicare PPO | Admitting: Physician Assistant

## 2021-04-23 ENCOUNTER — Other Ambulatory Visit: Payer: Self-pay

## 2021-04-23 DIAGNOSIS — J449 Chronic obstructive pulmonary disease, unspecified: Secondary | ICD-10-CM | POA: Diagnosis not present

## 2021-04-23 DIAGNOSIS — I480 Paroxysmal atrial fibrillation: Secondary | ICD-10-CM | POA: Diagnosis not present

## 2021-04-23 DIAGNOSIS — L89629 Pressure ulcer of left heel, unspecified stage: Secondary | ICD-10-CM | POA: Diagnosis not present

## 2021-04-23 DIAGNOSIS — I10 Essential (primary) hypertension: Secondary | ICD-10-CM | POA: Diagnosis not present

## 2021-04-23 DIAGNOSIS — L89620 Pressure ulcer of left heel, unstageable: Secondary | ICD-10-CM | POA: Diagnosis not present

## 2021-04-23 NOTE — Progress Notes (Signed)
[name], [name] ([id number]) Visit Report for 04/23/2021 Arrival Information Details Patient Name: Date of Service: [name], [name] 04/23/2021 1:00 PM Medical Record Number: [id number] Patient Account Number: [id number] Date of Birth/Sex: Treating RN: 1922/03/22 (88 y.o. [name], [name] Primary Care [name]: [name] Other Clinician: Referring [name]: Treating [name]/Extender: [name] in Treatment: 15 Visit Information History Since Last Visit Added or deleted any medications: No Patient Arrived: Wheel Chair Any new allergies or adverse reactions: No Arrival Time: 13:27 Had a fall or experienced change in No Accompanied By: families activities of daily living that may affect Transfer Assistance: Manual risk of falls: Patient Identification Verified: Yes Signs or symptoms of abuse/neglect since last visito No Secondary Verification Process Completed: Yes Hospitalized since last visit: No Patient Requires Transmission-Based Precautions: No Implantable device outside of the clinic excluding No Patient Has Alerts: Yes cellular tissue based products placed in the center Patient Alerts: Bilat ABI's NonComp since last visit: BP left arm ONLY Has Dressing in Place as Prescribed: Yes Pain Present Now: No Electronic Signature(s) Signed: 04/23/2021 5:31:46 PM By: Rhae Hammock RN Entered By: Rhae Hammock on 04/23/2021 13:28:15 -------------------------------------------------------------------------------- Clinic Level of Care Assessment Details Patient Name: Date of Service: [name] 04/23/2021 1:00 PM Medical Record Number: [id number] Patient Account Number: [id number] Date of Birth/Sex: Treating RN: 1923-02-11 (88 y.o. [name], [name] Primary Care [name]: [name] Other Clinician: Referring [name]: Treating [name]/Extender: [name] in Treatment:  15 Clinic Level of Care Assessment Items TOOL 4 Quantity Score X- 1 0 Use when only an EandM is performed on FOLLOW-UP visit ASSESSMENTS - Nursing Assessment / Reassessment X- 1 10 Reassessment of Co-morbidities (includes updates in patient status) X- 1 5 Reassessment of Adherence to Treatment Plan ASSESSMENTS - Wound and Skin A ssessment / Reassessment X - Simple Wound Assessment / Reassessment - one wound 1 5 []  - 0 Complex Wound Assessment / Reassessment - multiple wounds []  - 0 Dermatologic / Skin Assessment (not related to wound area) ASSESSMENTS - Focused Assessment []  - 0 Circumferential Edema Measurements - multi extremities []  - 0 Nutritional Assessment / Counseling / Intervention []  - 0 Lower Extremity Assessment (monofilament, tuning fork, pulses) []  - 0 Peripheral Arterial Disease Assessment (using hand held doppler) ASSESSMENTS - Ostomy and/or Continence Assessment and Care []  - 0 Incontinence Assessment and Management []  - 0 Ostomy Care Assessment and Management (repouching, etc.) PROCESS - Coordination of Care X - Simple Patient / Family Education for ongoing care 1 15 []  - 0 Complex (extensive) Patient / Family Education for ongoing care X- 1 10 Staff obtains Programmer, systems, Records, T Results / Process Orders est X- 1 10 Staff telephones HHA, Nursing Homes / Clarify orders / etc []  - 0 Routine Transfer to another Facility (non-emergent condition) []  - 0 Routine Hospital Admission (non-emergent condition) []  - 0 New Admissions / Biomedical engineer / Ordering NPWT Apligraf, etc. , []  - 0 Emergency Hospital Admission (emergent condition) X- 1 10 Simple Discharge Coordination []  - 0 Complex (extensive) Discharge Coordination PROCESS - Special Needs []  - 0 Pediatric / Minor Patient Management []  - 0 Isolation Patient Management []  - 0 Hearing / Language / Visual special needs []  - 0 Assessment of Community assistance (transportation, D/C  planning, etc.) []  - 0 Additional assistance / Altered mentation []  - 0 Support Surface(s) Assessment (bed, cushion, seat, etc.) INTERVENTIONS - Wound Cleansing / Measurement X - Simple Wound  Cleansing - one wound 1 5 []  - 0 Complex Wound Cleansing - multiple wounds X- 1 5 Wound Imaging (photographs - any number of wounds) []  - 0 Wound Tracing (instead of photographs) X- 1 5 Simple Wound Measurement - one wound []  - 0 Complex Wound Measurement - multiple wounds INTERVENTIONS - Wound Dressings X - Small Wound Dressing one or multiple wounds 1 10 []  - 0 Medium Wound Dressing one or multiple wounds []  - 0 Large Wound Dressing one or multiple wounds X- 1 5 Application of Medications - topical []  - 0 Application of Medications - injection INTERVENTIONS - Miscellaneous []  - 0 External ear exam []  - 0 Specimen Collection (cultures, biopsies, blood, body fluids, etc.) []  - 0 Specimen(s) / Culture(s) sent or taken to Lab for analysis []  - 0 Patient Transfer (multiple staff / Civil Service fast streamer / Similar devices) []  - 0 Simple Staple / Suture removal (25 or less) []  - 0 Complex Staple / Suture removal (26 or more) []  - 0 Hypo / Hyperglycemic Management (close monitor of Blood Glucose) []  - 0 Ankle / Brachial Index (ABI) - do not check if billed separately X- 1 5 Vital Signs Has the patient been seen at the hospital within the last three years: Yes Total Score: 100 Level Of Care: New/Established - Level 3 Electronic Signature(s) Signed: 04/23/2021 5:31:46 PM By: Rhae Hammock RN Entered By: Rhae Hammock on 04/23/2021 13:53:46 -------------------------------------------------------------------------------- Encounter Discharge Information Details Patient Name: Date of Service: [name], [name] Sarna R. 04/23/2021 1:00 PM Medical Record Number: [id number] Patient Account Number: [id number] Date of Birth/Sex: Treating RN: 02-02-1923 (88 y.o. [name], [name] Primary Care  [name]: [name] Other Clinician: Referring [name]: Treating [name]/Extender: [name] in Treatment: 15 Encounter Discharge Information Items Post Procedure Vitals Discharge Condition: Stable Temperature (F): 98.7 Ambulatory Status: Wheelchair Pulse (bpm): 74 Discharge Destination: [place] Respiratory Rate (breaths/min): 17 Telephoned: No Blood Pressure (mmHg): 134/74 Orders Sent: Yes Transportation: Private Auto Accompanied By: daughter Schedule Follow-up Appointment: Yes Clinical Summary of Care: Patient Declined Electronic Signature(s) Signed: 04/23/2021 5:31:46 PM By: Rhae Hammock RN Entered By: Rhae Hammock on 04/23/2021 13:54:47 -------------------------------------------------------------------------------- Lower Extremity Assessment Details Patient Name: Date of Service: [name], [name]. 04/23/2021 1:00 PM Medical Record Number: [id number] Patient Account Number: [id number] Date of Birth/Sex: Treating RN: [birth year] (88 y.o. [name], [name] Primary Care Jimmie Rueter: [name] Other Clinician: Referring Monette Omara: Treating [name]/Extender: [name] in Treatment: 15 Edema Assessment Assessed: [Left: Yes] [Right: No] Edema: [Left: N] [Right: o] Calf Left: Right: Point of Measurement: 31 cm From Medial Instep 35 cm Ankle Left: Right: Point of Measurement: 9 cm From Medial Instep 23 cm Vascular Assessment Pulses: Dorsalis Pedis Palpable: [Left:Yes] Posterior Tibial Palpable: [Left:Yes] Electronic Signature(s) Signed: 04/23/2021 5:31:46 PM By: Rhae Hammock RN Entered By: Rhae Hammock on 04/23/2021 13:28:54 -------------------------------------------------------------------------------- [place] Details Patient Name: Date of Service: [name], [name]. 04/23/2021 1:00 PM Medical Record Number:  [id number] Patient Account Number: [id number] Date of Birth/Sex: Treating RN: 01/20/1923 (88 y.o. [name], [name] Primary Care Yoseph Haile: [name] Other Clinician: Referring Avion Kutzer: Treating [name]/Extender: [name] in Treatment: [place] reviewed with physician Active Inactive Abuse / Safety / Falls / Self Care Management Nursing Diagnoses: History of Falls Goals: Patient will remain injury free related to falls Date Initiated: 01/08/2021 Target Resolution Date: 04/30/2021 Goal Status: Active Interventions: Assess Activities  of Daily Living upon admission and as needed Assess fall risk on admission and as needed Provide education on fall prevention Notes: 04/02/21: No falls but continues to be a fall risk. Pressure Nursing Diagnoses: Knowledge deficit related to management of pressures ulcers Goals: Patient will remain free of pressure ulcers Date Initiated: 01/08/2021 Target Resolution Date: 04/30/2021 Goal Status: Active Interventions: Assess offloading mechanisms upon admission and as needed Provide education on pressure ulcers Notes: Wound/Skin Impairment Nursing Diagnoses: Impaired tissue integrity Goals: Patient/caregiver will verbalize understanding of skin care regimen Date Initiated: 01/08/2021 Target Resolution Date: 04/30/2021 Goal Status: Active Ulcer/skin breakdown will have a volume reduction of 30% by week 4 Date Initiated: 01/08/2021 Date Inactivated: 02/19/2021 Target Resolution Date: 02/05/2021 Goal Status: Unmet Unmet Reason: PAD, nonambulatory Ulcer/skin breakdown will have a volume reduction of 50% by week 8 Date Initiated: 02/19/2021 Target Resolution Date: 04/09/2021 Goal Status: Active Interventions: Assess patient/caregiver ability to obtain necessary supplies Assess patient/caregiver ability to perform ulcer/skin care regimen upon admission and as needed Assess ulceration(s)  every visit Provide education on ulcer and skin care Treatment Activities: Topical wound management initiated : 01/08/2021 Notes: 04/02/21: Wound care regimen continues Electronic Signature(s) Signed: 04/23/2021 5:31:46 PM By: Rhae Hammock RN Entered By: Rhae Hammock on 04/23/2021 13:45:04 -------------------------------------------------------------------------------- Pain Assessment Details Patient Name: Date of Service: [name], [place]. 04/23/2021 1:00 PM Medical Record Number: [id number] Patient Account Number: [id number] Date of Birth/Sex: Treating RN: 12/11/1922 (88 y.o. [name], [name] Primary Care Brycen Bean: [name] Other Clinician: Referring Shyia Fillingim: Treating Maciah Feeback/Extender: [name] in Treatment: 15 Active Problems Location of Pain Severity and Description of Pain Patient Has Paino No Site Locations Pain Management and Medication Current Pain Management: Electronic Signature(s) Signed: 04/23/2021 5:31:46 PM By: Rhae Hammock RN Entered By: Rhae Hammock on 04/23/2021 13:28:43 -------------------------------------------------------------------------------- Patient/Caregiver Education Details Patient Name: Date of Service: BA LLINGER, [name] R. 2/22/2023andnbsp1:00 PM Medical Record Number: [id number] Patient Account Number: [id number] Date of Birth/Gender: Treating RN: 1922/09/04 (88 y.o. [name], [name] Primary Care Physician: [name] Other Clinician: Referring Physician: Treating Physician/Extender: [name] in Treatment: 15 Education Assessment Education Provided To: Patient and Caregiver Education Topics Provided Pressure: Methods: Explain/Verbal Responses: Reinforcements needed, State content correctly Safety: Methods: Explain/Verbal Responses: Reinforcements needed, State content correctly Wound/Skin Impairment: Electronic  Signature(s) Signed: 04/23/2021 5:31:46 PM By: Rhae Hammock RN Entered By: Rhae Hammock on 04/23/2021 13:45:36 -------------------------------------------------------------------------------- Wound Assessment Details Patient Name: Date of Service: [name], [name] Sarna R. 04/23/2021 1:00 PM Medical Record Number: [id number] Patient Account Number: [id number] Date of Birth/Sex: Treating RN: 12/12/1922 (88 y.o. [name], [name] Primary Care Stevana Dufner: [name] Other Clinician: Referring Saleema Weppler: Treating Jeniah Kishi/Extender: [name] in Treatment: 15 Wound Status Wound Number: 1 Primary Pressure Ulcer Etiology: Wound Location: Left Calcaneus Wound Open Wounding Event: Pressure Injury Status: Date Acquired: 09/26/2020 Comorbid Cataracts, Chronic Obstructive Pulmonary Disease (COPD), [name] Of Treatment: 15 History: Arrhythmia, Hypertension, Osteoarthritis, Received Radiation Clustered Wound: No Photos Wound Measurements Length: (cm) 0.8 Width: (cm) 1 Depth: (cm) 0.3 Area: (cm) 0.628 Volume: (cm) 0.188 % Reduction in Area: 83.3% % Reduction in Volume: 50.1% Epithelialization: Medium (34-66%) Tunneling: No Undermining: No Wound Description Classification: Unstageable/Unclassified Wound Margin: Distinct, outline attached Exudate Amount: Medium Exudate Type: Serosanguineous Exudate Color: red, brown Foul Odor After Cleansing: No Slough/Fibrino Yes Wound Bed Granulation Amount: Small (1-33%) Exposed Structure Granulation Quality: Pink Fascia Exposed: No Necrotic Amount: Large (67-100%) Fat Layer (  Subcutaneous Tissue) Exposed: Yes Necrotic Quality: Adherent Slough Tendon Exposed: No Muscle Exposed: No Joint Exposed: No Bone Exposed: No Treatment Notes Wound #1 (Calcaneus) Wound Laterality: Left Cleanser Soap and Water Discharge Instruction: May shower and wash wound with dial antibacterial soap and water prior to  dressing change. Wound Cleanser Discharge Instruction: Cleanse the wound with wound cleanser prior to applying a clean dressing using gauze sponges, not tissue or cotton balls. Peri-Wound Care Topical Primary Dressing Santyl Ointment Discharge Instruction: Apply nickel thick amount to wound bed as instructed Secondary Dressing Woven Gauze Sponges 2x2 in Discharge Instruction: Apply saline moistened gauze over Santyl Zetuvit Plus Silicone Border Dressing 4x4 (in/in) Discharge Instruction: Or equivalent silicone foam border Secured With Compression Wrap Compression Stockings Add-Ons Electronic Signature(s) Signed: 04/23/2021 5:31:46 PM By: Rhae Hammock RN Entered By: Rhae Hammock on 04/23/2021 13:40:41 -------------------------------------------------------------------------------- Vitals Details Patient Name: Date of Service: [name], [place]. 04/23/2021 1:00 PM Medical Record Number: [id number] Patient Account Number: [id number] Date of Birth/Sex: Treating RN: 1922/07/17 (88 y.o. [name], [name] Primary Care Kaleiyah Polsky: [name] Other Clinician: Referring Gaylynn Seiple: Treating Nesiah Jump/Extender: [name] in Treatment: 15 Vital Signs Time Taken: 13:28 Temperature (F): 98.7 Pulse (bpm): 74 Respiratory Rate (breaths/min): 17 Blood Pressure (mmHg): 134/74 Reference Range: 80 - 120 mg / dl Electronic Signature(s) Signed: 04/23/2021 5:31:46 PM By: Rhae Hammock RN Entered By: Rhae Hammock on 04/23/2021 13:28:34

## 2021-04-23 NOTE — Progress Notes (Signed)
[name], [name] ([id number]) Visit Report for 04/23/2021 Chief Complaint Document Details Patient Name: Date of Service: [name], [name] 04/23/2021 1:00 PM Medical Record Number: [id number] Patient Account Number: [id number] Date of Birth/Sex: Treating RN: 08/11/1922 (88 y.o. [name] Primary Care Provider: Aretta Nip Other Clinician: Referring Provider: Treating Provider/Extender: Cloyd Stagers in Treatment: 15 Information Obtained from: Patient Chief Complaint Left heel ulcer Electronic Signature(s) Signed: 04/23/2021 1:46:31 PM By: Worthy Keeler PA-C Entered By: Worthy Keeler on 04/23/2021 13:46:31 -------------------------------------------------------------------------------- Debridement Details Patient Name: Date of Service: [name], [name] Sarna R. 04/23/2021 1:00 PM Medical Record Number: [id number] Patient Account Number: [id number] Date of Birth/Sex: Treating RN: [birth year] (88 y.o. [name], [name] Primary Care Provider: Aretta Nip Other Clinician: Referring Provider: Treating Provider/Extender: Cloyd Stagers in Treatment: 15 Debridement Performed for Assessment: Wound #1 Left Calcaneus Performed By: Physician Worthy Keeler, PA Debridement Type: Chemical/Enzymatic/Mechanical Agent Used: Santyl Level of Consciousness (Pre-procedure): Awake and Alert Pre-procedure Verification/Time Out No Taken: Pain Control: Lidocaine Bleeding: Minimum Hemostasis Achieved: Pressure Response to Treatment: Procedure was tolerated well Level of Consciousness (Post- Awake and Alert procedure): Post Debridement Measurements of Total Wound Length: (cm) 0.8 Stage: Unstageable/Unclassified Width: (cm) 1 Depth: (cm) 0.3 Volume: (cm) 0.188 Character of Wound/Ulcer Post Debridement: Improved Post Procedure Diagnosis Same as Pre-procedure Electronic Signature(s) Signed: 04/23/2021 4:57:32 PM  By: Worthy Keeler PA-C Signed: 04/23/2021 5:31:46 PM By: Rhae Hammock RN Entered By: Rhae Hammock on 04/23/2021 13:51:27 -------------------------------------------------------------------------------- HPI Details Patient Name: Date of Service: [name], [name] Sarna R. 04/23/2021 1:00 PM Medical Record Number: [id number] Patient Account Number: [id number] Date of Birth/Sex: Treating RN: 03-Dec-1922 (88 y.o. [name] Primary Care Provider: Aretta Nip Other Clinician: Referring Provider: Treating Provider/Extender: Cloyd Stagers in Treatment: 15 History of Present Illness HPI Description: 01/08/2021 patient presents today for evaluation of a pressure ulcer which began after she fell in July and broke her arm. This unfortunately has led to her being overall more generally weak and she is not really up and moving around much at all. While she was in the hospital she developed the heel ulcers bilaterally the right was more deep tissue injury that pretty much appears to be healed at this point. At the current skilled nursing facility they have actually been doing a great job taking care of her according to her friend who also helps take care of her. Subsequently the patient's wound on the left heel is eschar covered and probably would benefit from clearing away this eschar so that she can actually see some improvements hopefully. Currently they have been utilizing Iodosorb along with a border foam dressing. Patient has a history of hypertension, COPD, generalized muscle weakness, and atrial fibrillation. Due to her age they elected not to place her on any blood thinners for the atrial fibrillation. 01/15/2021 upon evaluation today patient appears to be doing better in regard to the heel ulcer. I do not have initially upon evaluation today her arterial studies that were done at the facility. Therefore I did not perform any sharp debridement while she  was present during the office visit today. Nonetheless I am going to suggest based on what was seen currently that we probably once we get this with you at everything appears to be okay consider stop debridement at the next visit. 01/29/2021 upon evaluation today patient appears to be doing well with regard to her wound  all things considered her blood flow is definitely not very good based on the review of the arterial study although it was really incomplete in my opinion there was not even a brachial reading to compare to but nonetheless the readings in the extremities were very poor. In the end I did actually discuss with her going ahead and going to vascular and they have already get that appointment set up which is great news. Fortunately there does not appear to be signs of infection currently. The wound is really not making tremendous progress but again I would not expect it to do so with where things stand currently until we can get this moving a little bit better direction. 02/19/2021 upon evaluation today patient's wound actually showing signs of doing about the same the still very thick region of eschar noted currently. I do think that based on the fact that there really is nothing from a vascular standpoint recommend currently which I completely understand that this is still something that is going to require as aggressive as we can wound care obviously I am not going to perform any debridement but nonetheless I do think that good wound care including Santyl can be beneficial for the patient currently. 03/12/2021 upon evaluation today patient appears to be doing well with regard to her wound. Fortunately there does not appear to be any signs of active infection at this time which is great news. No fevers, chills, nausea, vomiting, or diarrhea. I do feel like the [facility] is doing a good job although we need to make sure that they are put in a saline moistened gauze and behind to make sure this  stays nice and moist it seems to be a little bit dry. 04/02/2021 upon evaluation today patient's wound is actually showing signs of good improvement. Fortunately there does not appear to be any evidence of active infection locally nor systemically at this time which is great news. No fevers, chills, nausea, vomiting, or diarrhea. 04/23/2021 upon evaluation today patient's wound is actually showing signs of being a little bit smaller which is good news and I think that we are headed in the right direction. Obviously this is something that is going to continue to slowly progress I believe and again as long as we keep it clean and keep it from getting infected that is going to be the big thing. Electronic Signature(s) Signed: 04/23/2021 1:52:05 PM By: Worthy Keeler PA-C Entered By: Worthy Keeler on 04/23/2021 13:52:05 -------------------------------------------------------------------------------- Physical Exam Details Patient Name: Date of Service: [name], [name] 04/23/2021 1:00 PM Medical Record Number: [id number] Patient Account Number: [id number] Date of Birth/Sex: Treating RN: Aug 12, 1922 (88 y.o. [name] Primary Care Provider: Aretta Nip Other Clinician: Referring Provider: Treating Provider/Extender: Otis Brace Weeks in Treatment: 58 Constitutional Well-nourished and well-hydrated in no acute distress. Respiratory normal breathing without difficulty. Psychiatric this patient is able to make decisions and demonstrates good insight into disease process. Alert and Oriented x 3. pleasant and cooperative. Notes Upon inspection patient's wound bed again showed signs of good granulation and epithelization at this point. I do not see any signs of active infection locally or systemically which is great and overall I am extremely pleased with where we stand today. Electronic Signature(s) Signed: 04/23/2021 1:52:28 PM By: Worthy Keeler  PA-C Entered By: Worthy Keeler on 04/23/2021 13:52:28 -------------------------------------------------------------------------------- Physician Orders Details Patient Name: Date of Service: [name], [name] Sarna R. 04/23/2021 1:00 PM Medical Record Number:  [id number] Patient Account Number: [id number] Date of Birth/Sex: Treating RN: 08-Oct-1922 (88 y.o. Benjaman Lobe Primary Care Provider: Aretta Nip Other Clinician: Referring Provider: Treating Provider/Extender: Cloyd Stagers in Treatment: 15 Verbal / Phone Orders: No Diagnosis Coding Follow-up Appointments Return appointment in 3 weeks. - with Toney Rakes in Room # 9 Bathing/ Shower/ Hygiene May shower and wash wound with soap and water. - with dressing change Edema Control - Lymphedema / SCD / Other Exercise regularly - exercise legs daily Moisturize legs daily. Off-Loading Turn and reposition every 2 hours - up in chair for meals Other: - Continue to use Bunny Boots/Float Heels AT ALL TIMES Additional Orders / Instructions Follow Nutritious Diet - Continue Prostat Non Wound Condition Protect area with: - Continue foam border to right heel for protection. Wound Treatment Wound #1 - Calcaneus Wound Laterality: Left Cleanser: Soap and Water 1 x Per Day/30 Days Discharge Instructions: May shower and wash wound with dial antibacterial soap and water prior to dressing change. Cleanser: Wound Cleanser 1 x Per Day/30 Days Discharge Instructions: Cleanse the wound with wound cleanser prior to applying a clean dressing using gauze sponges, not tissue or cotton balls. Prim Dressing: Santyl Ointment 1 x Per Day/30 Days ary Discharge Instructions: Apply nickel thick amount to wound bed as instructed Secondary Dressing: Woven Gauze Sponges 2x2 in 1 x Per Day/30 Days Discharge Instructions: Apply saline moistened gauze over Santyl Secondary Dressing: Zetuvit Plus Silicone Border Dressing 4x4  (in/in) 1 x Per Day/30 Days Discharge Instructions: Or equivalent silicone foam border Electronic Signature(s) Signed: 04/23/2021 4:57:32 PM By: Worthy Keeler PA-C Signed: 04/23/2021 5:31:46 PM By: Rhae Hammock RN Entered By: Rhae Hammock on 04/23/2021 13:44:44 -------------------------------------------------------------------------------- Problem List Details Patient Name: Date of Service: [name], [place]. 04/23/2021 1:00 PM Medical Record Number: [id number] Patient Account Number: [id number] Date of Birth/Sex: Treating RN: [birth year] (88 y.o. [name] Primary Care Provider: Aretta Nip Other Clinician: Referring Provider: Treating Provider/Extender: Otis Brace Weeks in Treatment: 15 Active Problems ICD-10 Encounter Code Description Active Date MDM Diagnosis L89.620 Pressure ulcer of left heel, unstageable 01/08/2021 No Yes I10 Essential (primary) hypertension 01/08/2021 No Yes J44.9 Chronic obstructive pulmonary disease, unspecified 01/08/2021 No Yes M62.81 Muscle weakness (generalized) 01/08/2021 No Yes I48.0 Paroxysmal atrial fibrillation 01/08/2021 No Yes Inactive Problems Resolved Problems Electronic Signature(s) Signed: 04/23/2021 1:46:05 PM By: Worthy Keeler PA-C Entered By: Worthy Keeler on 04/23/2021 13:46:05 -------------------------------------------------------------------------------- Progress Note Details Patient Name: Date of Service: [name], [place]. 04/23/2021 1:00 PM Medical Record Number: [id number] Patient Account Number: [id number] Date of Birth/Sex: Treating RN: [birth year] (88 y.o. [name] Primary Care Provider: Aretta Nip Other Clinician: Referring Provider: Treating Provider/Extender: Cloyd Stagers in Treatment: 15 Subjective Chief Complaint Information obtained from Patient Left heel ulcer History of Present Illness (HPI) 01/08/2021  patient presents today for evaluation of a pressure ulcer which began after she fell in July and broke her arm. This unfortunately has led to her being overall more generally weak and she is not really up and moving around much at all. While she was in the hospital she developed the heel ulcers bilaterally the right was more deep tissue injury that pretty much appears to be healed at this point. At the current skilled nursing facility they have actually been doing a great job taking care of her according to her friend who also helps take care  of her. Subsequently the patient's wound on the left heel is eschar covered and probably would benefit from clearing away this eschar so that she can actually see some improvements hopefully. Currently they have been utilizing Iodosorb along with a border foam dressing. Patient has a history of hypertension, COPD, generalized muscle weakness, and atrial fibrillation. Due to her age they elected not to place her on any blood thinners for the atrial fibrillation. 01/15/2021 upon evaluation today patient appears to be doing better in regard to the heel ulcer. I do not have initially upon evaluation today her arterial studies that were done at the facility. Therefore I did not perform any sharp debridement while she was present during the office visit today. Nonetheless I am going to suggest based on what was seen currently that we probably once we get this with you at everything appears to be okay consider stop debridement at the next visit. 01/29/2021 upon evaluation today patient appears to be doing well with regard to her wound all things considered her blood flow is definitely not very good based on the review of the arterial study although it was really incomplete in my opinion there was not even a brachial reading to compare to but nonetheless the readings in the extremities were very poor. In the end I did actually discuss with her going ahead and going to  vascular and they have already get that appointment set up which is great news. Fortunately there does not appear to be signs of infection currently. The wound is really not making tremendous progress but again I would not expect it to do so with where things stand currently until we can get this moving a little bit better direction. 02/19/2021 upon evaluation today patient's wound actually showing signs of doing about the same the still very thick region of eschar noted currently. I do think that based on the fact that there really is nothing from a vascular standpoint recommend currently which I completely understand that this is still something that is going to require as aggressive as we can wound care obviously I am not going to perform any debridement but nonetheless I do think that good wound care including Santyl can be beneficial for the patient currently. 03/12/2021 upon evaluation today patient appears to be doing well with regard to her wound. Fortunately there does not appear to be any signs of active infection at this time which is great news. No fevers, chills, nausea, vomiting, or diarrhea. I do feel like the [facility] is doing a good job although we need to make sure that they are put in a saline moistened gauze and behind to make sure this stays nice and moist it seems to be a little bit dry. 04/02/2021 upon evaluation today patient's wound is actually showing signs of good improvement. Fortunately there does not appear to be any evidence of active infection locally nor systemically at this time which is great news. No fevers, chills, nausea, vomiting, or diarrhea. 04/23/2021 upon evaluation today patient's wound is actually showing signs of being a little bit smaller which is good news and I think that we are headed in the right direction. Obviously this is something that is going to continue to slowly progress I believe and again as long as we keep it clean and keep it from  getting infected that is going to be the big thing. Objective Constitutional Well-nourished and well-hydrated in no acute distress. Vitals Time Taken: 1:28 PM, Temperature: 98.7 F, Pulse: 74 bpm,  Respiratory Rate: 17 breaths/min, Blood Pressure: 134/74 mmHg. Respiratory normal breathing without difficulty. Psychiatric this patient is able to make decisions and demonstrates good insight into disease process. Alert and Oriented x 3. pleasant and cooperative. General Notes: Upon inspection patient's wound bed again showed signs of good granulation and epithelization at this point. I do not see any signs of active infection locally or systemically which is great and overall I am extremely pleased with where we stand today. Integumentary (Hair, Skin) Wound #1 status is Open. Original cause of wound was Pressure Injury. The date acquired was: 09/26/2020. The wound has been in treatment 15 weeks. The wound is located on the Left Calcaneus. The wound measures 0.8cm length x 1cm width x 0.3cm depth; 0.628cm^2 area and 0.188cm^3 volume. There is Fat Layer (Subcutaneous Tissue) exposed. There is no tunneling or undermining noted. There is a medium amount of serosanguineous drainage noted. The wound margin is distinct with the outline attached to the wound base. There is small (1-33%) pink granulation within the wound bed. There is a large (67-100%) amount of necrotic tissue within the wound bed including Adherent Slough. Assessment Active Problems ICD-10 Pressure ulcer of left heel, unstageable Essential (primary) hypertension Chronic obstructive pulmonary disease, unspecified Muscle weakness (generalized) Paroxysmal atrial fibrillation Procedures Wound #1 Pre-procedure diagnosis of Wound #1 is a Pressure Ulcer located on the Left Calcaneus . There was a Chemical/Enzymatic/Mechanical debridement performed by Worthy Keeler, PA. after achieving pain control using Lidocaine. Agent used was [employer].  A Minimum amount of bleeding was controlled with Pressure. The procedure was tolerated well. Post Debridement Measurements: 0.8cm length x 1cm width x 0.3cm depth; 0.188cm^3 volume. Post debridement Stage noted as Unstageable/Unclassified. Character of Wound/Ulcer Post Debridement is improved. Post procedure Diagnosis Wound #1: Same as Pre-Procedure Plan Follow-up Appointments: Return appointment in 3 weeks. - with Toney Rakes in Room # 9 Bathing/ Shower/ Hygiene: May shower and wash wound with soap and water. - with dressing change Edema Control - Lymphedema / SCD / Other: Exercise regularly - exercise legs daily Moisturize legs daily. Off-Loading: Turn and reposition every 2 hours - up in chair for meals Other: - Continue to use Bunny Boots/Float Heels AT ALL TIMES Additional Orders / Instructions: Follow Nutritious Diet - Continue Prostat Non Wound Condition: Protect area with: - Continue foam border to right heel for protection. WOUND #1: - Calcaneus Wound Laterality: Left Cleanser: Soap and Water 1 x Per Day/30 Days Discharge Instructions: May shower and wash wound with dial antibacterial soap and water prior to dressing change. Cleanser: Wound Cleanser 1 x Per Day/30 Days Discharge Instructions: Cleanse the wound with wound cleanser prior to applying a clean dressing using gauze sponges, not tissue or cotton balls. Prim Dressing: Santyl Ointment 1 x Per Day/30 Days ary Discharge Instructions: Apply nickel thick amount to wound bed as instructed Secondary Dressing: Woven Gauze Sponges 2x2 in 1 x Per Day/30 Days Discharge Instructions: Apply saline moistened gauze over Santyl Secondary Dressing: Zetuvit Plus Silicone Border Dressing 4x4 (in/in) 1 x Per Day/30 Days Discharge Instructions: Or equivalent silicone foam border 1. I would recommend that we continue with the wound care measures as before and the patient is in agreement the plan. This includes the use of the  Santyl which I think is doing an awesome job. 2. I am also can recommend that we have the patient continue with the saline moistened gauze to follow. 3. I would also suggest we continue with the Zetuvit bordered foam dressing.  We will see patient back for reevaluation in 3 weeks here in the clinic. If anything worsens or changes patient will contact our office for additional recommendations. Electronic Signature(s) Signed: 04/23/2021 1:53:16 PM By: Worthy Keeler PA-C Entered By: Worthy Keeler on 04/23/2021 13:53:16 -------------------------------------------------------------------------------- SuperBill Details Patient Name: Date of Service: [name], Riccardo Dubin 04/23/2021 Medical Record Number: [id number] Patient Account Number: [id number] Date of Birth/Sex: Treating RN: [birth year] (88 y.o. [name], [name] Primary Care Provider: Aretta Nip Other Clinician: Referring Provider: Treating Provider/Extender: Cloyd Stagers in Treatment: 15 Diagnosis Coding ICD-10 Codes Code Description [phone] Pressure ulcer of left heel, unstageable I10 Essential (primary) hypertension J44.9 Chronic obstructive pulmonary disease, unspecified M62.81 Muscle weakness (generalized) I48.0 Paroxysmal atrial fibrillation Facility Procedures CPT4 Code: [id number] Description: 57262 - WOUND CARE VISIT-LEV 3 EST PT Modifier: Quantity: 1 CPT4 Code: [id number] Description: 63845 - DEBRIDE W/O ANES NON SELECT Modifier: Quantity: 1 Electronic Signature(s) Signed: 04/23/2021 4:57:32 PM By: Worthy Keeler PA-C Signed: 04/23/2021 5:31:46 PM By: Rhae Hammock RN Previous Signature: 04/23/2021 1:53:54 PM Version By: Worthy Keeler PA-C Entered By: Rhae Hammock on 04/23/2021 13:53:59

## 2021-04-29 DIAGNOSIS — M6281 Muscle weakness (generalized): Secondary | ICD-10-CM | POA: Diagnosis not present

## 2021-04-29 DIAGNOSIS — M179 Osteoarthritis of knee, unspecified: Secondary | ICD-10-CM | POA: Diagnosis not present

## 2021-05-08 DIAGNOSIS — K5900 Constipation, unspecified: Secondary | ICD-10-CM | POA: Diagnosis not present

## 2021-05-14 ENCOUNTER — Other Ambulatory Visit: Payer: Self-pay

## 2021-05-14 ENCOUNTER — Encounter (HOSPITAL_BASED_OUTPATIENT_CLINIC_OR_DEPARTMENT_OTHER): Payer: Medicare PPO | Attending: Physician Assistant | Admitting: General Surgery

## 2021-05-14 DIAGNOSIS — J449 Chronic obstructive pulmonary disease, unspecified: Secondary | ICD-10-CM | POA: Diagnosis not present

## 2021-05-14 DIAGNOSIS — M6281 Muscle weakness (generalized): Secondary | ICD-10-CM | POA: Insufficient documentation

## 2021-05-14 DIAGNOSIS — I10 Essential (primary) hypertension: Secondary | ICD-10-CM | POA: Diagnosis not present

## 2021-05-14 DIAGNOSIS — I480 Paroxysmal atrial fibrillation: Secondary | ICD-10-CM | POA: Insufficient documentation

## 2021-05-14 DIAGNOSIS — L89620 Pressure ulcer of left heel, unstageable: Secondary | ICD-10-CM | POA: Insufficient documentation

## 2021-05-15 NOTE — Progress Notes (Signed)
REDONNA, WILBERT ([id number]) ?Visit Report for 05/14/2021 ?Chief Complaint Document Details ?Patient Name: Date of Service: ?ANABIA, WEATHERWAX 05/14/2021 1:00 PM ?Medical Record Number: [id number] ?Patient Account Number: [id number] ?Date of Birth/Sex: Treating RN: ?02/19/1923 (88 y.o. F) ?Primary Care Provider: Aretta Nip Other Clinician: ?Referring Provider: ?Treating Provider/Extender: Fredirick Maudlin ?[place], [place] ?Weeks in Treatment: 18 ?Information Obtained from: Patient ?Chief Complaint ?Left heel ulcer ?Electronic Signature(s) ?Signed: 05/14/2021 2:11:03 PM By: Fredirick Maudlin MD FACS ?Entered By: Fredirick Maudlin on 05/14/2021 14:11:03 ?-------------------------------------------------------------------------------- ?Debridement Details ?Patient Name: Date of Service: ?ILDA, LASKIN 05/14/2021 1:00 PM ?Medical Record Number: [id number] ?Patient Account Number: [id number] ?Date of Birth/Sex: Treating RN: ?1922/05/07 (88 y.o. F) Deaton, Bobbi ?Primary Care Provider: Aretta Nip Other Clinician: ?Referring Provider: ?Treating Provider/Extender: Fredirick Maudlin ?[place], [place] ?Weeks in Treatment: 18 ?Debridement Performed for Assessment: Wound #1 Left Calcaneus ?Performed By: Physician Fredirick Maudlin, MD ?Debridement Type: Debridement ?Level of Consciousness (Pre-procedure): Awake and Alert ?Pre-procedure Verification/Time Out Yes - 13:50 ?Taken: ?Start Time: 13:51 ?Pain Control: Lidocaine 4% T opical Solution ?T Area Debrided (L x W): ?otal 0.8 (cm) x 0.5 (cm) = 0.4 (cm?) ?Tissue and other material debrided: Viable, Non-Viable, Slough, Subcutaneous, Slough ?Level: Skin/Subcutaneous Tissue ?Debridement Description: Excisional ?Instrument: Curette ?Bleeding: Moderate ?Hemostasis Achieved: Pressure ?End Time: 14:00 ?Procedural Pain: 0 ?Post Procedural Pain: 0 ?Response to Treatment: Procedure was tolerated well ?Level of Consciousness (Post- Awake and Alert ?procedure): ?Post  Debridement Measurements of Total Wound ?Length: (cm) 0.8 ?Stage: Unstageable/Unclassified ?Width: (cm) 0.5 ?Depth: (cm) 0.2 ?Volume: (cm?) 0.063 ?Character of Wound/Ulcer Post Debridement: Improved ?Post Procedure Diagnosis ?Same as Pre-procedure ?Electronic Signature(s) ?Signed: 05/14/2021 6:42:44 PM By: Fredirick Maudlin MD FACS ?Signed: 05/15/2021 6:07:05 PM By: Deon Pilling RN, BSN ?Entered By: Deon Pilling on 05/14/2021 14:05:19 ?-------------------------------------------------------------------------------- ?HPI Details ?Patient Name: Date of Service: ?JAIYLA, GRANADOS 05/14/2021 1:00 PM ?Medical Record Number: [id number] ?Patient Account Number: [id number] ?Date of Birth/Sex: Treating RN: ?1922-06-25 (88 y.o. F) ?Primary Care Provider: Aretta Nip Other Clinician: ?Referring Provider: ?Treating Provider/Extender: Fredirick Maudlin ?[place], [place] ?Weeks in Treatment: 18 ?History of Present Illness ?HPI Description: 01/08/2021 patient presents today for evaluation of a pressure ulcer which began after she fell in July and broke her arm. This unfortunately ?has led to her being overall more generally weak and she is not really up and moving around much at all. While she was in the hospital she developed the heel ?ulcers bilaterally the right was more deep tissue injury that pretty much appears to be healed at this point. At the current skilled nursing facility they have ?actually been doing a great job taking care of her according to her friend who also helps take care of her. Subsequently the patient's wound on the left heel is ?eschar covered and probably would benefit from clearing away this eschar so that she can actually see some improvements hopefully. Currently they have ?been utilizing Iodosorb along with a border foam dressing. ?Patient has a history of hypertension, COPD, generalized muscle weakness, and atrial fibrillation. Due to her age they elected not to place her on any  blood ?thinners for the atrial fibrillation. ?01/15/2021 upon evaluation today patient appears to be doing better in regard to the heel ulcer. I do not have initially upon evaluation today her arterial studies ?that were done at the facility. Therefore I did not perform any sharp debridement while she was present during the office visit today. Nonetheless I am going to ?suggest based on  what was seen currently that we probably once we get this with you at everything appears to be okay consider stop debridement at the next ?visit. ?01/29/2021 upon evaluation today patient appears to be doing well with regard to her wound all things considered her blood flow is definitely not very good ?based on the review of the arterial study although it was really incomplete in my opinion there was not even a brachial reading to compare to but nonetheless ?the readings in the extremities were very poor. In the end I did actually discuss with her going ahead and going to vascular and they have already get that ?appointment set up which is great news. Fortunately there does not appear to be signs of infection currently. The wound is really not making tremendous ?progress but again I would not expect it to do so with where things stand currently until we can get this moving a little bit better direction. ?02/19/2021 upon evaluation today patient's wound actually showing signs of doing about the same the still very thick region of eschar noted currently. I do ?think that based on the fact that there really is nothing from a vascular standpoint recommend currently which I completely understand that this is still ?something that is going to require as aggressive as we can wound care obviously I am not going to perform any debridement but nonetheless I do think that ?good wound care including Santyl can be beneficial for the patient currently. ?03/12/2021 upon evaluation today patient appears to be doing well with regard to her wound.  Fortunately there does not appear to be any signs of active ?infection at this time which is great news. No fevers, chills, nausea, vomiting, or diarrhea. I do feel like the [facility] Needs is doing a good job although we need to ?make sure that they are put in a saline moistened gauze and behind to make sure this stays nice and moist it seems to be a little bit dry. ?04/02/2021 upon evaluation today patient's wound is actually showing signs of good improvement. Fortunately there does not appear to be any evidence of ?active infection locally nor systemically at this time which is great news. No fevers, chills, nausea, vomiting, or diarrhea. ?04/23/2021 upon evaluation today patient's wound is actually showing signs of being a little bit smaller which is good news and I think that we are headed in the ?right direction. Obviously this is something that is going to continue to slowly progress I believe and again as long as we keep it clean and keep it from getting ?infected that is going to be the big thing. ?05/14/2021: Today, the wound has contracted even more. There continues to be loose but somewhat thick yellow slough in the wound base. The wound is moist. ?Electronic Signature(s) ?Signed: 05/14/2021 2:12:06 PM By: Fredirick Maudlin MD FACS ?Entered By: Fredirick Maudlin on 05/14/2021 14:12:06 ?-------------------------------------------------------------------------------- ?Physical Exam Details ?Patient Name: ?Date of Service: ?ARHIANNA, EBEY. 05/14/2021 1:00 PM ?Medical Record Number: [id number] ?Patient Account Number: [id number] ?Date of Birth/Sex: ?Treating RN: ?[birth year] (88 y.o. F) ?Primary Care Provider: Milagros Evener R ?Other Clinician: ?Referring Provider: ?Treating Provider/Extender: Fredirick Maudlin ?[place], [place] ?Weeks in Treatment: 18 ?Constitutional ?Slightly hypertensive. Bradycardic, asymptomatic.. . . No acute distress. ?Respiratory ?Normal work of breathing on supplemental  oxygen. ?Notes ?05/14/2021: Wound examleft heel ulcer continues to contract and epithelialize. There is loosely adherent thick yellow slough at the base. No concern for the ?periwound skin and no signs of infection. ?Electronic Signature(s)

## 2021-05-15 NOTE — Progress Notes (Signed)
[name], [name] ([id number]) ?Visit Report for 05/14/2021 ?Arrival Information Details ?Patient Name: Date of Service: ?[name], [name] 05/14/2021 1:00 PM ?Medical Record Number: [id number] ?Patient Account Number: [id number] ?Date of Birth/Sex: Treating [name]: ?08-Dec-1922 (88 y.o. F) [name] ?Primary Care [name]: [name] Other Clinician: ?Referring [name]: ?Treating Burnett Lieber/Extender: [name] ?[place], [place] ?Weeks in Treatment: 18 ?Visit Information History Since Last Visit ?Added or deleted any medications: No ?Patient Arrived: [name] ?Any new allergies or adverse reactions: No ?Arrival Time: 13:43 ?Had a fall or experienced change in No ?Accompanied By: caregiver ?activities of daily living that may affect ?Transfer Assistance: None ?risk of falls: ?Patient Identification Verified: Yes ?Signs or symptoms of abuse/neglect since last visito No ?Patient Requires Transmission-Based Precautions: No ?Implantable device outside of the clinic excluding No ?Patient Has Alerts: Yes ?cellular tissue based products placed in the center ?Patient Alerts: Bilat ABI's NonComp since last visit: ?BP left arm ONLY ?Has Dressing in Place as Prescribed: Yes ?Pain Present Now: No ?Electronic Signature(s) ?Signed: 05/14/2021 3:00:40 PM By: [name] ?Entered By: [name] Catholic on 05/14/2021 13:44:03 ?-------------------------------------------------------------------------------- ?Encounter Discharge Information Details ?Patient Name: Date of Service: ?[name], [name] 05/14/2021 1:00 PM ?Medical Record Number: [id number] ?Patient Account Number: [id number] ?Date of Birth/Sex: Treating [name]: ?08-19-1922 (88 y.o. F) [name] ?Primary Care Deanda Ruddell: [name] Other Clinician: ?Referring Keaira [name]: ?Treating Davonne Jarnigan/Extender: [name] ?[name] ?Weeks in Treatment: 18 ?Encounter Discharge Information Items Post Procedure Vitals ?Discharge Condition:  Stable ?Temperature (F): 97.5 ?Ambulatory Status: Wheelchair ?Pulse (bpm): 56 ?Discharge Destination: Home ?Respiratory Rate (breaths/min): 18 ?Transportation: Private Auto ?Blood Pressure (mmHg): 150/73 ?Accompanied By: friend ?Schedule Follow-up Appointment: Yes ?Clinical Summary of Care: ?Electronic Signature(s) ?Signed: 05/15/2021 6:07:05 PM By: [name] ?Entered By: [name] Pilling on 05/14/2021 14:09:49 ?-------------------------------------------------------------------------------- ?Lower Extremity Assessment Details ?Patient Name: ?Date of Service: ?[name], [name] 05/14/2021 1:00 PM ?Medical Record Number: [id number] ?Patient Account Number: [id number] ?Date of Birth/Sex: ?Treating [name]: ?December 14, 1922 (88 y.o. F) [name] ?Primary Care Bailey Kolbe: [name] ?Other Clinician: ?Referring [name]: ?Treating Artasia Thang/Extender: [name] ?[employer], [place] ?Weeks in Treatment: 18 ?Edema Assessment ?Assessed: [Left: Yes] [Right: No] ?Edema: [Left: N] [Right: o] ?Calf ?Left: Right: ?Point of Measurement: 31 cm From Medial Instep 41 cm ?Ankle ?Left: Right: ?Point of Measurement: 9 cm From Medial Instep 25.5 cm ?Vascular Assessment ?Pulses: ?Dorsalis Pedis ?Palpable: [Left:Yes] ?Electronic Signature(s) ?Signed: 05/14/2021 3:00:40 PM By: [name] ?Signed: 05/15/2021 6:07:05 PM By: [name] ?Entered By: [name] Catholic on 05/14/2021 13:45:17 ?-------------------------------------------------------------------------------- ?Multi Wound Chart Details ?Patient Name: ?Date of Service: ?[name], [name] 05/14/2021 1:00 PM ?Medical Record Number: [id number] ?Patient Account Number: [id number] ?Date of Birth/Sex: ?Treating [name]: ?09/08/1922 (88 y.o. F) ?Primary Care [name]: [name] ?Other Clinician: ?Referring Veola Cafaro: ?Treating Virna Livengood/Extender: [name] ?[name] ?Weeks in Treatment: 18 ?Vital Signs ?Height(in): ?Pulse(bpm):  56 ?Weight(lbs): ?Blood Pressure(mmHg): 150/73 ?Body Mass Index(BMI): ?Temperature(??F): 97.5 ?Respiratory Rate(breaths/min): 18 ?Photos: [1:Left Calcaneus] [N/A:N/A N/A] ?Wound Location: [1:Pressure Injury] [N/A:N/A] ?Wounding Event: [1:Pressure Ulcer] [N/A:N/A] ?Primary Etiology: [1:Cataracts, Chronic Obstructive] [N/A:N/A] ?Comorbid History: [1:Pulmonary Disease (COPD), Arrhythmia, Hypertension, Osteoarthritis, Received Radiation 09/26/2020] [N/A:N/A] ?Date Acquired: [1:18] [N/A:N/A] ?Weeks of Treatment: [1:Open] [N/A:N/A] ?Wound Status: [1:No] [N/A:N/A] ?Wound Recurrence: [1:0.8x0.5x0.2] [N/A:N/A] ?Measurements L x W x D (cm) [1:0.314] [N/A:N/A] ?A (cm?) : ?rea [1:0.063] [N/A:N/A] ?Volume (cm?) : [1:91.70%] [N/A:N/A] ?% Reduction in A [1:rea: 83.30%] [N/A:N/A] ?% Reduction in Volume: [1:Unstageable/Unclassified] [N/A:N/A] ?Classification: [1:Medium] [N/A:N/A] ?Exudate A mount: [  1:Serosanguineous] [N/A:N/A] ?Exudate Type: [1:red, brown] [N/A:N/A] ?Exudate Color: [1:Distinct, outline attached] [N/A:N/A] ?Wound Margin: [1:None Present (0%)] [N/A:N/A] ?Granulation A mount: [1:Large (67-100%)] [N/A:N/A] ?Necrotic A mount: ?[1:Fat Layer (Subcutaneous Tissue): Yes N/A] ?Exposed Structures: ?[1:Fascia: No Tendon: No Muscle: No Joint: No Bone: No Medium (34-66%)] [N/A:N/A] ?Epithelialization: [1:Debridement - Excisional] [N/A:N/A] ?Debridement: ?Pre-procedure Verification/Time Out 13:50 [N/A:N/A] ?Taken: [1:Lidocaine 4% Topical Solution] [N/A:N/A] ?Pain Control: [1:Subcutaneous, Slough] [N/A:N/A] ?Tissue Debrided: [1:Skin/Subcutaneous Tissue] [N/A:N/A] ?Level: [1:0.4] [N/A:N/A] ?Debridement A (sq cm): [1:rea Curette] [N/A:N/A] ?Instrument: [1:Moderate] [N/A:N/A] ?Bleeding: [1:Pressure] [N/A:N/A] ?Hemostasis A chieved: [1:0] [N/A:N/A] ?Procedural Pain: [1:0] [N/A:N/A] ?Post Procedural Pain: [1:Procedure was tolerated well] [N/A:N/A] ?Debridement Treatment Response: [1:0.8x0.5x0.2] [N/A:N/A] ?Post Debridement Measurements L  x ?W x D (cm) [1:0.063] [N/A:N/A] ?Post Debridement Volume: (cm?) [1:Unstageable/Unclassified] [N/A:N/A] ?Post Debridement Stage: [1:Debridement] [N/A:N/A] ?Treatment Notes ?Wound #1 (Calcaneus) Wound Laterality: Left ?Cleanser ?Soap and Water ?Discharge Instruction: May shower and wash wound with dial antibacterial soap and water prior to dressing change. ?Wound Cleanser ?Discharge Instruction: Cleanse the wound with wound cleanser prior to applying a clean dressing using gauze sponges, not tissue or cotton balls. ?Peri-Wound Care ?Topical ?Primary Dressing ?Santyl Ointment ?Discharge Instruction: Apply nickel thick amount to wound bed as instructed ?Secondary Dressing ?Woven Gauze Sponges 2x2 in ?Discharge Instruction: Apply dry gauze over the santyl. **DISCONTINUE MOISTEN SALINE GAUZE.*** ?Zetuvit Plus Silicone Border Dressing 4x4 (in/in) ?Discharge Instruction: Or equivalent silicone foam border ?Secured With ?Compression Wrap ?Compression Stockings ?Add-Ons ?Electronic Signature(s) ?Signed: 05/14/2021 2:10:53 PM By: [name] Maudlin MD FACS ?Entered By: [name] on 05/14/2021 14:10:53 ?-------------------------------------------------------------------------------- ?Multi-Disciplinary Care Plan Details ?Patient Name: Date of Service: ?[name], [name] 05/14/2021 1:00 PM ?Medical Record Number: [id number] ?Patient Account Number: [id number] ?Date of Birth/Sex: Treating [name]: ?Jul 29, 1922 (88 y.o. F) [name] ?Primary Care Carie Kapuscinski: [name] Other Clinician: ?Referring Blonnie Maske: ?Treating Elianys Conry/Extender: [name] ?[place], [name] ?Weeks in Treatment: 18 ?Multidisciplinary Care Plan reviewed with physician ?Active Inactive ?Abuse / Safety / Falls / Self Care Management ?Nursing Diagnoses: ?History of Falls ?Goals: ?Patient will remain injury free related to falls ?Date Initiated: 01/08/2021 ?Target Resolution Date: 05/30/2021 ?Goal Status: Active ?Interventions: ?Assess Activities  of Daily Living upon admission and as needed ?Assess fall risk on admission and as needed ?Provide education on fall prevention ?Notes: ?04/02/21: No falls but continues to be a fall risk. ?Pressure ?Nursing Diagnoses: ?Knowl

## 2021-05-28 DIAGNOSIS — I5032 Chronic diastolic (congestive) heart failure: Secondary | ICD-10-CM | POA: Diagnosis not present

## 2021-05-28 DIAGNOSIS — I10 Essential (primary) hypertension: Secondary | ICD-10-CM | POA: Diagnosis not present

## 2021-05-28 DIAGNOSIS — M1990 Unspecified osteoarthritis, unspecified site: Secondary | ICD-10-CM | POA: Diagnosis not present

## 2021-05-28 DIAGNOSIS — G894 Chronic pain syndrome: Secondary | ICD-10-CM | POA: Diagnosis not present

## 2021-05-28 DIAGNOSIS — J449 Chronic obstructive pulmonary disease, unspecified: Secondary | ICD-10-CM | POA: Diagnosis not present

## 2021-05-28 DIAGNOSIS — E039 Hypothyroidism, unspecified: Secondary | ICD-10-CM | POA: Diagnosis not present

## 2021-05-28 DIAGNOSIS — I4891 Unspecified atrial fibrillation: Secondary | ICD-10-CM | POA: Diagnosis not present

## 2021-06-04 ENCOUNTER — Encounter (HOSPITAL_BASED_OUTPATIENT_CLINIC_OR_DEPARTMENT_OTHER): Payer: Medicare PPO | Attending: Physician Assistant | Admitting: Physician Assistant

## 2021-06-04 DIAGNOSIS — M6281 Muscle weakness (generalized): Secondary | ICD-10-CM | POA: Insufficient documentation

## 2021-06-04 DIAGNOSIS — I10 Essential (primary) hypertension: Secondary | ICD-10-CM | POA: Insufficient documentation

## 2021-06-04 DIAGNOSIS — L89620 Pressure ulcer of left heel, unstageable: Secondary | ICD-10-CM | POA: Insufficient documentation

## 2021-06-04 DIAGNOSIS — I480 Paroxysmal atrial fibrillation: Secondary | ICD-10-CM | POA: Diagnosis not present

## 2021-06-04 DIAGNOSIS — J449 Chronic obstructive pulmonary disease, unspecified: Secondary | ICD-10-CM | POA: Insufficient documentation

## 2021-06-04 DIAGNOSIS — M1990 Unspecified osteoarthritis, unspecified site: Secondary | ICD-10-CM | POA: Diagnosis not present

## 2021-06-23 DIAGNOSIS — G894 Chronic pain syndrome: Secondary | ICD-10-CM | POA: Diagnosis not present

## 2021-06-23 DIAGNOSIS — M79602 Pain in left arm: Secondary | ICD-10-CM | POA: Diagnosis not present

## 2021-06-23 DIAGNOSIS — R600 Localized edema: Secondary | ICD-10-CM | POA: Diagnosis not present

## 2021-06-23 NOTE — Progress Notes (Signed)
[name], [name] ([id number]) ?Visit Report for 06/04/2021 ?Chief Complaint Document Details ?Patient Name: Date of Service: ?[name], [name] 06/04/2021 2:00 PM ?Medical Record Number: [id number] ?Patient Account Number: [id number] ?Date of Birth/Sex: Treating RN: ?08-06-1922 (88 y.o. [name], [name] ?Primary Care Provider: Aretta Nip Other Clinician: ?Referring Provider: ?Treating Provider/Extender: Worthy Keeler ?[place], [place] ?Weeks in Treatment: 21 ?Information Obtained from: Patient ?Chief Complaint ?Left heel ulcer ?Electronic Signature(s) ?Signed: 06/04/2021 3:09:41 PM By: Worthy Keeler PA-C ?Entered By: Worthy Keeler on 06/04/2021 15:09:40 ?-------------------------------------------------------------------------------- ?HPI Details ?Patient Name: Date of Service: ?[name], [name] 06/04/2021 2:00 PM ?Medical Record Number: [id number] ?Patient Account Number: [id number] ?Date of Birth/Sex: Treating RN: ?October 11, 1922 (88 y.o. [name], [name] ?Primary Care Provider: Aretta Nip Other Clinician: ?Referring Provider: ?Treating Provider/Extender: Worthy Keeler ?[place], [place] ?Weeks in Treatment: 21 ?History of Present Illness ?HPI Description: 01/08/2021 patient presents today for evaluation of a pressure ulcer which began after she fell in July and broke her arm. This unfortunately ?has led to her being overall more generally weak and she is not really up and moving around much at all. While she was in the hospital she developed the heel ?ulcers bilaterally the right was more deep tissue injury that pretty much appears to be healed at this point. At the current skilled nursing facility they have ?actually been doing a great job taking care of her according to her friend who also helps take care of her. Subsequently the patient's wound on the left heel is ?eschar covered and probably would benefit from clearing away this eschar so that she can actually see some  improvements hopefully. Currently they have ?been utilizing Iodosorb along with a border foam dressing. ?Patient has a history of hypertension, COPD, generalized muscle weakness, and atrial fibrillation. Due to her age they elected not to place her on any blood ?thinners for the atrial fibrillation. ?01/15/2021 upon evaluation today patient appears to be doing better in regard to the heel ulcer. I do not have initially upon evaluation today her arterial studies ?that were done at the facility. Therefore I did not perform any sharp debridement while she was present during the office visit today. Nonetheless I am going to ?suggest based on what was seen currently that we probably once we get this with you at everything appears to be okay consider stop debridement at the next ?visit. ?01/29/2021 upon evaluation today patient appears to be doing well with regard to her wound all things considered her blood flow is definitely not very good ?based on the review of the arterial study although it was really incomplete in my opinion there was not even a brachial reading to compare to but nonetheless ?the readings in the extremities were very poor. In the end I did actually discuss with her going ahead and going to vascular and they have already get that ?appointment set up which is great news. Fortunately there does not appear to be signs of infection currently. The wound is really not making tremendous ?progress but again I would not expect it to do so with where things stand currently until we can get this moving a little bit better direction. ?02/19/2021 upon evaluation today patient's wound actually showing signs of doing about the same the still very thick region of eschar noted currently. I do ?think that based on the fact that there really is nothing from a vascular standpoint recommend currently which I completely understand that this is still ?something  that is going to require as aggressive as we can wound care  obviously I am not going to perform any debridement but nonetheless I do think that ?good wound care including Santyl can be beneficial for the patient currently. ?03/12/2021 upon evaluation today patient appears to be doing well with regard to her wound. Fortunately there does not appear to be any signs of active ?infection at this time which is great news. No fevers, chills, nausea, vomiting, or diarrhea. I do feel like the Annitta Needs is doing a good job although we need to ?make sure that they are put in a saline moistened gauze and behind to make sure this stays nice and moist it seems to be a little bit dry. ?04/02/2021 upon evaluation today patient's wound is actually showing signs of good improvement. Fortunately there does not appear to be any evidence of ?active infection locally nor systemically at this time which is great news. No fevers, chills, nausea, vomiting, or diarrhea. ?04/23/2021 upon evaluation today patient's wound is actually showing signs of being a little bit smaller which is good news and I think that we are headed in the ?right direction. Obviously this is something that is going to continue to slowly progress I believe and again as long as we keep it clean and keep it from getting ?infected that is going to be the big thing. ?05/14/2021: Today, the wound has contracted even more. There continues to be loose but somewhat thick yellow slough in the wound base. The wound is moist. ?06-04-2021 upon evaluation today patient appears to be doing well currently in regard to her heel ulcer. I am actually extremely pleased with where we stand and I ?think the patient is making good progress here. There does not appear to be any signs of active infection locally or systemically which is great news. ?Electronic Signature(s) ?Signed: 06/04/2021 5:15:59 PM By: Worthy Keeler PA-C ?Entered By: Worthy Keeler on 06/04/2021  17:15:59 ?-------------------------------------------------------------------------------- ?Physical Exam Details ?Patient Name: Date of Service: ?[name], [name] 06/04/2021 2:00 PM ?Medical Record Number: [id number] ?Patient Account Number: [id number] ?Date of Birth/Sex: Treating RN: ?04-Jul-1922 (88 y.o. [name], [name] ?Primary Care Provider: Aretta Nip Other Clinician: ?Referring Provider: ?Treating Provider/Extender: Worthy Keeler ?[place], [place] ?Weeks in Treatment: 21 ?Constitutional ?Well-nourished and well-hydrated in no acute distress. ?Respiratory ?normal breathing without difficulty. ?Psychiatric ?this patient is able to make decisions and demonstrates good insight into disease process. Alert and Oriented x 3. pleasant and cooperative. ?Notes ?Upon inspection patient's wound bed showed evidence of good granulation and epithelization at this point. Overall I am extremely pleased and I think that we ?are definitely headed in the right direction. I do not see any signs of active infection at this time which is great news. I actually think we are ready to use a ?different dressing and I think collagen would probably be a very good option here. ?Electronic Signature(s) ?Signed: 06/04/2021 5:20:30 PM By: Worthy Keeler PA-C ?Entered By: Worthy Keeler on 06/04/2021 17:20:29 ?-------------------------------------------------------------------------------- ?Physician Orders Details ?Patient Name: Date of Service: ?[name], [name] 06/04/2021 2:00 PM ?Medical Record Number: [id number] ?Patient Account Number: [id number] ?Date of Birth/Sex: Treating RN: ?18-Oct-1922 (88 y.o. [name], [name] ?Primary Care Provider: Aretta Nip Other Clinician: ?Referring Provider: ?Treating Provider/Extender: Worthy Keeler ?[place], [place] ?Weeks in Treatment: 21 ?Verbal / Phone Orders: No ?Diagnosis Coding ?Follow-up Appointments ?Return appointment in 3 weeks. - with Toney Rakes in Room #  9 ?  Bathing/ Shower/ Hygiene ?May shower and wash wound with soap and water. - with dressing change ?Edema Control - Lymphedema / SCD / Other ?Exercise regularly - exercise legs daily ?Moisturize legs daily. ?Off-Loading ?Turn and reposition every 2 hours - up in chair for meals ?Other: - Continue to use Bu

## 2021-06-23 NOTE — Progress Notes (Signed)
[name], [name] ?Visit Report for 06/04/2021 ?Arrival Information Details ?Patient Name: Date of Service: ?[name], [name] 06/04/2021 2:00 PM ?Medical Record Number: [id number] ?Patient Account Number: [id number] ?Date of Birth/Sex: Treating RN: ?[birth year] (88 y.o. [name], [name] ?Primary Care Herny Scurlock: Aretta Nip Other Clinician: ?Referring Aamari West: ?Treating Samyria Rudie/Extender: Worthy Keeler ?[place], [place] ?Weeks in Treatment: 21 ?Visit Information History Since Last Visit ?Added or deleted any medications: No ?Patient Arrived: Wheel Chair ?Any new allergies or adverse reactions: No ?Arrival Time: 14:22 ?Had a fall or experienced change in No ?Accompanied By: family ?activities of daily living that may affect ?Transfer Assistance: Manual ?risk of falls: ?Patient Identification Verified: Yes ?Signs or symptoms of abuse/neglect since last visito No ?Secondary Verification Process Completed: Yes ?Hospitalized since last visit: No ?Patient Requires Transmission-Based Precautions: No ?Implantable device outside of the clinic excluding No ?Patient Has Alerts: Yes ?cellular tissue based products placed in the center ?Patient Alerts: Bilat ABI's NonComp since last visit: ?BP left arm ONLY Has Dressing in Place as Prescribed: Yes ?Pain Present Now: Yes ?Electronic Signature(s) ?Signed: 06/23/2021 12:30:28 PM By: Rhae Hammock RN ?Entered By: Rhae Hammock on 06/04/2021 14:22:42 ?-------------------------------------------------------------------------------- ?Clinic Level of Care Assessment Details ?Patient Name: Date of Service: ?[name], [name] 06/04/2021 2:00 PM ?Medical Record Number: [id number] ?Patient Account Number: [id number] ?Date of Birth/Sex: Treating RN: ?04/12/1922 (88 y.o. [name], [name] ?Primary Care Cobie Marcoux: Aretta Nip Other Clinician: ?Referring Robina Hamor: ?Treating Calirose Mccance/Extender: Worthy Keeler ?[place], [place] ?Weeks in Treatment:  21 ?Clinic Level of Care Assessment Items ?TOOL 4 Quantity Score ?X- 1 0 ?Use when only an EandM is performed on FOLLOW-UP visit ?ASSESSMENTS - Nursing Assessment / Reassessment ?X- 1 10 ?Reassessment of Co-morbidities (includes updates in patient status) ?X- 1 5 ?Reassessment of Adherence to Treatment Plan ?ASSESSMENTS - Wound and Skin A ssessment / Reassessment ?X - Simple Wound Assessment / Reassessment - one wound 1 5 ?'[]'$  - 0 ?Complex Wound Assessment / Reassessment - multiple wounds ?'[]'$  - 0 ?Dermatologic / Skin Assessment (not related to wound area) ?ASSESSMENTS - Focused Assessment ?X- 1 5 ?Circumferential Edema Measurements - multi extremities ?'[]'$  - 0 ?Nutritional Assessment / Counseling / Intervention ?'[]'$  - 0 ?Lower Extremity Assessment (monofilament, tuning fork, pulses) ?'[]'$  - 0 ?Peripheral Arterial Disease Assessment (using hand held doppler) ?ASSESSMENTS - Ostomy and/or Continence Assessment and Care ?'[]'$  - 0 ?Incontinence Assessment and Management ?'[]'$  - 0 ?Ostomy Care Assessment and Management (repouching, etc.) ?PROCESS - Coordination of Care ?X - Simple Patient / Family Education for ongoing care 1 15 ?'[]'$  - 0 ?Complex (extensive) Patient / Family Education for ongoing care ?X- 1 10 ?Staff obtains Consents, Records, T Results / Process Orders ?est ?X- 1 10 ?Staff telephones HHA, Nursing Homes / Clarify orders / etc ?'[]'$  - 0 ?Routine Transfer to another Facility (non-emergent condition) ?'[]'$  - 0 ?Routine Hospital Admission (non-emergent condition) ?'[]'$  - 0 ?New Admissions / Biomedical engineer / Ordering NPWT Apligraf, etc. ?, ?'[]'$  - 0 ?Emergency Hospital Admission (emergent condition) ?X- 1 10 ?Simple Discharge Coordination ?'[]'$  - 0 ?Complex (extensive) Discharge Coordination ?PROCESS - Special Needs ?'[]'$  - 0 ?Pediatric / Minor Patient Management ?'[]'$  - 0 ?Isolation Patient Management ?'[]'$  - 0 ?Hearing / Language / Visual special needs ?'[]'$  - 0 ?Assessment of Community assistance (transportation, D/C  planning, etc.) ?'[]'$  - 0 ?Additional assistance / Altered mentation ?'[]'$  - 0 ?Support Surface(s) Assessment (bed, cushion, seat, etc.) ?INTERVENTIONS - Wound Cleansing / Measurement ?X - Simple Wound  Cleansing - one wound 1 5 ?'[]'$  - 0 ?Complex Wound Cleansing - multiple wounds ?X- 1 5 ?Wound Imaging (photographs - any number of wounds) ?'[]'$  - 0 ?Wound Tracing (instead of photographs) ?X- 1 5 ?Simple Wound Measurement - one wound ?'[]'$  - 0 ?Complex Wound Measurement - multiple wounds ?INTERVENTIONS - Wound Dressings ?X - Small Wound Dressing one or multiple wounds 1 10 ?'[]'$  - 0 ?Medium Wound Dressing one or multiple wounds ?'[]'$  - 0 ?Large Wound Dressing one or multiple wounds ?X- 1 5 ?Application of Medications - topical ?'[]'$  - 0 ?Application of Medications - injection ?INTERVENTIONS - Miscellaneous ?'[]'$  - 0 ?External ear exam ?'[]'$  - 0 ?Specimen Collection (cultures, biopsies, blood, body fluids, etc.) ?'[]'$  - 0 ?Specimen(s) / Culture(s) sent or taken to Lab for analysis ?'[]'$  - 0 ?Patient Transfer (multiple staff / Civil Service fast streamer / Similar devices) ?'[]'$  - 0 ?Simple Staple / Suture removal (25 or less) ?'[]'$  - 0 ?Complex Staple / Suture removal (26 or more) ?'[]'$  - 0 ?Hypo / Hyperglycemic Management (close monitor of Blood Glucose) ?'[]'$  - 0 ?Ankle / Brachial Index (ABI) - do not check if billed separately ?X- 1 5 ?Vital Signs ?Has the patient been seen at the hospital within the last three years: Yes ?Total Score: 105 ?Level Of Care: New/Established - Level 3 ?Electronic Signature(s) ?Signed: 06/23/2021 12:30:28 PM By: Rhae Hammock RN ?Entered By: Rhae Hammock on 06/04/2021 14:42:55 ?-------------------------------------------------------------------------------- ?Complex / Palliative Patient Assessment Details ?Patient Name: Date of Service: ?[name], [name] 06/04/2021 2:00 PM ?Medical Record Number: [id number] ?Patient Account Number: [id number] ?Date of Birth/Sex: Treating RN: ?December 13, 1922 (88 y.o. F) [place], [place] ?Primary Care  Bentley Haralson: Aretta Nip Other Clinician: ?Referring Joe Tanney: ?Treating Zamani Crocker/Extender: Worthy Keeler ?[place], [place] ?Weeks in Treatment: 21 ?Complex Wound Management Criteria ?Patient has remarkable or complex co-morbidities requiring medications or treatments that extend wound healing times. Examples: ?Diabetes mellitus with chronic renal failure or end stage renal disease requiring dialysis ?Advanced or poorly controlled rheumatoid arthritis ?Diabetes mellitus and end stage chronic obstructive pulmonary disease ?Active cancer with current chemo- or radiation therapy ?COPD,PAD,OA, A.fib, HTN, sarcoidosis, history of Breast Ca chemo/radiation, wheelchair bound ?Palliative Wound Management Criteria ?Care Approach ?Wound Care Plan: Complex Wound Management ?Electronic Signature(s) ?Signed: 06/20/2021 2:40:03 PM By: Deon Pilling RN, BSN ?Signed: 06/20/2021 5:33:51 PM By: Worthy Keeler PA-C ?Entered By: Deon Pilling on 06/20/2021 14:40:02 ?-------------------------------------------------------------------------------- ?Encounter Discharge Information Details ?Patient Name: Date of Service: ?[name], [name] 06/04/2021 2:00 PM ?Medical Record Number: [id number] ?Patient Account Number: [id number] ?Date of Birth/Sex: Treating RN: ?[birth year] (88 y.o. [name], [name] ?Primary Care Greysin Medlen: Aretta Nip Other Clinician: ?Referring Isak Sotomayor: ?Treating [place]/Extender: Worthy Keeler ?[place], [place] ?Weeks in Treatment: 21 ?Encounter Discharge Information Items ?Discharge Condition: Stable ?Ambulatory Status: Wheelchair ?Discharge Destination: Home ?Transportation: Private Auto ?Accompanied By: self ?Schedule Follow-up Appointment: Yes ?Clinical Summary of Care: Patient Declined ?Electronic Signature(s) ?Signed: 06/23/2021 12:30:28 PM By: Rhae Hammock RN ?Entered By: Rhae Hammock on 06/04/2021  14:44:20 ?-------------------------------------------------------------------------------- ?Lower Extremity Assessment Details ?Patient Name: ?Date of Service: ?[name], [name]. 06/04/2021 2:00 PM ?Medical Record Number: [id number] ?Patient Account Number: [id number] ?Date of Birth/Sex: ?Treatin

## 2021-06-24 DIAGNOSIS — M19032 Primary osteoarthritis, left wrist: Secondary | ICD-10-CM | POA: Diagnosis not present

## 2021-06-24 DIAGNOSIS — M19022 Primary osteoarthritis, left elbow: Secondary | ICD-10-CM | POA: Diagnosis not present

## 2021-06-25 ENCOUNTER — Encounter (HOSPITAL_BASED_OUTPATIENT_CLINIC_OR_DEPARTMENT_OTHER): Payer: Medicare PPO | Admitting: Physician Assistant

## 2021-06-25 DIAGNOSIS — I10 Essential (primary) hypertension: Secondary | ICD-10-CM | POA: Diagnosis not present

## 2021-06-25 DIAGNOSIS — R600 Localized edema: Secondary | ICD-10-CM | POA: Diagnosis not present

## 2021-06-25 DIAGNOSIS — M1990 Unspecified osteoarthritis, unspecified site: Secondary | ICD-10-CM | POA: Diagnosis not present

## 2021-06-25 DIAGNOSIS — L89620 Pressure ulcer of left heel, unstageable: Secondary | ICD-10-CM | POA: Diagnosis not present

## 2021-06-25 DIAGNOSIS — J449 Chronic obstructive pulmonary disease, unspecified: Secondary | ICD-10-CM | POA: Diagnosis not present

## 2021-06-25 DIAGNOSIS — Z7689 Persons encountering health services in other specified circumstances: Secondary | ICD-10-CM | POA: Diagnosis not present

## 2021-06-25 DIAGNOSIS — L97422 Non-pressure chronic ulcer of left heel and midfoot with fat layer exposed: Secondary | ICD-10-CM | POA: Diagnosis not present

## 2021-06-25 DIAGNOSIS — M6281 Muscle weakness (generalized): Secondary | ICD-10-CM | POA: Diagnosis not present

## 2021-06-25 DIAGNOSIS — I480 Paroxysmal atrial fibrillation: Secondary | ICD-10-CM | POA: Diagnosis not present

## 2021-06-25 NOTE — Progress Notes (Signed)
[name], [name] ([id number]) ?Visit Report for 06/25/2021 ?Arrival Information Details ?Patient Name: Date of Service: ?[name], [name] 06/25/2021 2:00 PM ?Medical Record Number: [id number] ?Patient Account Number: [id number] ?Date of Birth/Sex: Treating RN: ?[birth year] (88 y.o. [name], [name] ?Primary Care [name]: Aretta Nip Other Clinician: ?Referring [name]: ?Treating [name]/Extender: [name] ?[place], [name] ?Weeks in Treatment: 24 ?Visit Information History Since Last Visit ?Added or deleted any medications: No ?Patient Arrived: Wheel Chair ?Any new allergies or adverse reactions: No ?Arrival Time: 14:14 ?Had a fall or experienced change in No ?Accompanied By: daughter ?activities of daily living that may affect ?Transfer Assistance: Manual ?risk of falls: ?Patient Identification Verified: Yes ?Signs or symptoms of abuse/neglect since last visito No ?Secondary Verification Process Completed: Yes ?Hospitalized since last visit: No ?Patient Requires Transmission-Based Precautions: No ?Implantable device outside of the clinic excluding No ?Patient Has Alerts: Yes ?cellular tissue based products placed in the center ?Patient Alerts: Bilat ABI's NonComp since last visit: ?BP left arm ONLY Has Dressing in Place as Prescribed: Yes ?Pain Present Now: No ?Electronic Signature(s) ?Signed: 06/25/2021 3:30:56 PM By: [name] Hammock RN ?Entered By: [name] on 06/25/2021 14:14:35 ?-------------------------------------------------------------------------------- ?Clinic Level of Care Assessment Details ?Patient Name: Date of Service: ?[name], [name] 06/25/2021 2:00 PM ?Medical Record Number: [id number] ?Patient Account Number: [id number] ?Date of Birth/Sex: Treating RN: ?07-17-1922 (88 y.o. [name], [name] ?Primary Care [name]: Aretta Nip Other Clinician: ?Referring [name]: ?Treating Soua Caltagirone/Extender: [name] ?[place], [place] ?Weeks in Treatment:  24 ?Clinic Level of Care Assessment Items ?TOOL 4 Quantity Score ?X- 1 0 ?Use when only an EandM is performed on FOLLOW-UP visit ?ASSESSMENTS - Nursing Assessment / Reassessment ?X- 1 10 ?Reassessment of Co-morbidities (includes updates in patient status) ?X- 1 5 ?Reassessment of Adherence to Treatment Plan ?ASSESSMENTS - Wound and Skin A ssessment / Reassessment ?X - Simple Wound Assessment / Reassessment - one wound 1 5 ?'[]'$  - 0 ?Complex Wound Assessment / Reassessment - multiple wounds ?'[]'$  - 0 ?Dermatologic / Skin Assessment (not related to wound area) ?ASSESSMENTS - Focused Assessment ?'[]'$  - 0 ?Circumferential Edema Measurements - multi extremities ?'[]'$  - 0 ?Nutritional Assessment / Counseling / Intervention ?'[]'$  - 0 ?Lower Extremity Assessment (monofilament, tuning fork, pulses) ?'[]'$  - 0 ?Peripheral Arterial Disease Assessment (using hand held doppler) ?ASSESSMENTS - Ostomy and/or Continence Assessment and Care ?'[]'$  - 0 ?Incontinence Assessment and Management ?'[]'$  - 0 ?Ostomy Care Assessment and Management (repouching, etc.) ?PROCESS - Coordination of Care ?X - Simple Patient / Family Education for ongoing care 1 15 ?'[]'$  - 0 ?Complex (extensive) Patient / Family Education for ongoing care ?X- 1 10 ?Staff obtains Consents, Records, T Results / Process Orders ?est ?X- 1 10 ?Staff telephones HHA, Nursing Homes / Clarify orders / etc ?'[]'$  - 0 ?Routine Transfer to another Facility (non-emergent condition) ?'[]'$  - 0 ?Routine Hospital Admission (non-emergent condition) ?'[]'$  - 0 ?New Admissions / Biomedical engineer / Ordering NPWT Apligraf, etc. ?, ?'[]'$  - 0 ?Emergency Hospital Admission (emergent condition) ?X- 1 10 ?Simple Discharge Coordination ?'[]'$  - 0 ?Complex (extensive) Discharge Coordination ?PROCESS - Special Needs ?'[]'$  - 0 ?Pediatric / Minor Patient Management ?'[]'$  - 0 ?Isolation Patient Management ?'[]'$  - 0 ?Hearing / Language / Visual special needs ?'[]'$  - 0 ?Assessment of Community assistance (transportation, D/C  planning, etc.) ?'[]'$  - 0 ?Additional assistance / Altered mentation ?'[]'$  - 0 ?Support Surface(s) Assessment (bed, cushion, seat, etc.) ?INTERVENTIONS - Wound Cleansing / Measurement ?X - Simple Wound  Cleansing - one wound 1 5 ?'[]'$  - 0 ?Complex Wound Cleansing - multiple wounds ?X- 1 5 ?Wound Imaging (photographs - any number of wounds) ?'[]'$  - 0 ?Wound Tracing (instead of photographs) ?X- 1 5 ?Simple Wound Measurement - one wound ?'[]'$  - 0 ?Complex Wound Measurement - multiple wounds ?INTERVENTIONS - Wound Dressings ?X - Small Wound Dressing one or multiple wounds 1 10 ?'[]'$  - 0 ?Medium Wound Dressing one or multiple wounds ?'[]'$  - 0 ?Large Wound Dressing one or multiple wounds ?X- 1 5 ?Application of Medications - topical ?'[]'$  - 0 ?Application of Medications - injection ?INTERVENTIONS - Miscellaneous ?'[]'$  - 0 ?External ear exam ?'[]'$  - 0 ?Specimen Collection (cultures, biopsies, blood, body fluids, etc.) ?'[]'$  - 0 ?Specimen(s) / Culture(s) sent or taken to Lab for analysis ?'[]'$  - 0 ?Patient Transfer (multiple staff / Civil Service fast streamer / Similar devices) ?'[]'$  - 0 ?Simple Staple / Suture removal (25 or less) ?'[]'$  - 0 ?Complex Staple / Suture removal (26 or more) ?'[]'$  - 0 ?Hypo / Hyperglycemic Management (close monitor of Blood Glucose) ?'[]'$  - 0 ?Ankle / Brachial Index (ABI) - do not check if billed separately ?X- 1 5 ?Vital Signs ?Has the patient been seen at the hospital within the last three years: Yes ?Total Score: 100 ?Level Of Care: New/Established - Level 3 ?Electronic Signature(s) ?Signed: 06/25/2021 3:30:56 PM By: [name] Hammock RN ?Entered By: [name] on 06/25/2021 14:39:45 ?-------------------------------------------------------------------------------- ?Encounter Discharge Information Details ?Patient Name: Date of Service: ?[name], [name] 06/25/2021 2:00 PM ?Medical Record Number: [id number] ?Patient Account Number: [id number] ?Date of Birth/Sex: Treating RN: ?Aug 25, 1922 (88 y.o. [name], [name] ?Primary Care  Orabelle Rylee: Aretta Nip Other Clinician: ?Referring Jasn Xia: ?Treating Steward Sames/Extender: [name] ?[place], [place] ?Weeks in Treatment: 24 ?Encounter Discharge Information Items ?Discharge Condition: Stable ?Ambulatory Status: Wheelchair ?Discharge Destination: Home ?Transportation: Private Auto ?Accompanied By: self ?Schedule Follow-up Appointment: Yes ?Clinical Summary of Care: Patient Declined ?Electronic Signature(s) ?Signed: 06/25/2021 3:30:56 PM By: [name] Hammock RN ?Entered By: [name] on 06/25/2021 14:46:47 ?-------------------------------------------------------------------------------- ?Lower Extremity Assessment Details ?Patient Name: Date of Service: ?[name], [name] 06/25/2021 2:00 PM ?Medical Record Number: [id number] ?Patient Account Number: [id number] ?Date of Birth/Sex: Treating RN: ?08-14-1922 (88 y.o. [name], [name] ?Primary Care Emilene Roma: Aretta Nip Other Clinician: ?Referring Jalynn Betzold: ?Treating Eason Housman/Extender: [name] ?[place], [place] ?Weeks in Treatment: 24 ?Edema Assessment ?Assessed: [Left: Yes] [Right: No] ?Edema: [Left: N] [Right: o] ?Calf ?Left: Right: ?Point of Measurement: 31 cm From Medial Instep 41 cm ?Ankle ?Left: Right: ?Point of Measurement: 9 cm From Medial Instep 25.5 cm ?Vascular Assessment ?Pulses: ?Dorsalis Pedis ?Palpable: [Left:Yes] ?Posterior Tibial ?Palpable: [Left:Yes] ?Electronic Signature(s) ?Signed: 06/25/2021 3:30:56 PM By: [name] Hammock RN ?Entered By: [name] on 06/25/2021 14:16:12 ?-------------------------------------------------------------------------------- ?Multi-Disciplinary Care Plan Details ?Patient Name: Date of Service: ?[name], [name] 06/25/2021 2:00 PM ?Medical Record Number: [id number] ?Patient Account Number: [id number] ?Date of Birth/Sex: Treating RN: ?1922-10-25 (88 y.o. [name], [name] ?Primary Care Charnette Younkin: Aretta Nip Other Clinician: ?Referring  Genaro Bekker: ?Treating Leverne Amrhein/Extender: [name] ?[place], [place] ?Weeks in Treatment: 24 ?Multidisciplinary Care Plan reviewed with physician ?Active Inactive ?Abuse / Safety / Falls / Self Care Management ?Nursi

## 2021-06-25 NOTE — Progress Notes (Addendum)
HEIDE, BROSSART ([id number]) ?Visit Report for 06/25/2021 ?Chief Complaint Document Details ?Patient Name: Date of Service: ?[name], [name] 06/25/2021 2:00 PM ?Medical Record Number: [id number] ?Patient Account Number: [id number] ?Date of Birth/Sex: Treating RN: ?Jan 11, 1923 (88 y.o. [name], [name] ?Primary Care Provider: Aretta Nip Other Clinician: ?Referring Provider: ?Treating Provider/Extender: Worthy Keeler ?[place], [place] ?Weeks in Treatment: 24 ?Information Obtained from: Patient ?Chief Complaint ?Left heel ulcer ?Electronic Signature(s) ?Signed: 06/25/2021 2:27:39 PM By: Worthy Keeler PA-C ?Entered By: Worthy Keeler on 06/25/2021 14:27:39 ?-------------------------------------------------------------------------------- ?HPI Details ?Patient Name: Date of Service: ?[name], [name] 06/25/2021 2:00 PM ?Medical Record Number: [id number] ?Patient Account Number: [id number] ?Date of Birth/Sex: Treating RN: ?Oct 29, 1922 (88 y.o. [name], [name] ?Primary Care Provider: Aretta Nip Other Clinician: ?Referring Provider: ?Treating Provider/Extender: Worthy Keeler ?[place], [place] ?Weeks in Treatment: 24 ?History of Present Illness ?HPI Description: 01/08/2021 patient presents today for evaluation of a pressure ulcer which began after she fell in July and broke her arm. This unfortunately ?has led to her being overall more generally weak and she is not really up and moving around much at all. While she was in the hospital she developed the heel ?ulcers bilaterally the right was more deep tissue injury that pretty much appears to be healed at this point. At the current skilled nursing facility they have ?actually been doing a great job taking care of her according to her friend who also helps take care of her. Subsequently the patient's wound on the left heel is ?eschar covered and probably would benefit from clearing away this eschar so that she can actually see some  improvements hopefully. Currently they have ?been utilizing Iodosorb along with a border foam dressing. ?Patient has a history of hypertension, COPD, generalized muscle weakness, and atrial fibrillation. Due to her age they elected not to place her on any blood ?thinners for the atrial fibrillation. ?01/15/2021 upon evaluation today patient appears to be doing better in regard to the heel ulcer. I do not have initially upon evaluation today her arterial studies ?that were done at the facility. Therefore I did not perform any sharp debridement while she was present during the office visit today. Nonetheless I am going to ?suggest based on what was seen currently that we probably once we get this with you at everything appears to be okay consider stop debridement at the next ?visit. ?01/29/2021 upon evaluation today patient appears to be doing well with regard to her wound all things considered her blood flow is definitely not very good ?based on the review of the arterial study although it was really incomplete in my opinion there was not even a brachial reading to compare to but nonetheless ?the readings in the extremities were very poor. In the end I did actually discuss with her going ahead and going to vascular and they have already get that ?appointment set up which is great news. Fortunately there does not appear to be signs of infection currently. The wound is really not making tremendous ?progress but again I would not expect it to do so with where things stand currently until we can get this moving a little bit better direction. ?02/19/2021 upon evaluation today patient's wound actually showing signs of doing about the same the still very thick region of eschar noted currently. I do ?think that based on the fact that there really is nothing from a vascular standpoint recommend currently which I completely understand that this is still ?something  that is going to require as aggressive as we can wound care  obviously I am not going to perform any debridement but nonetheless I do think that ?good wound care including Santyl can be beneficial for the patient currently. ?03/12/2021 upon evaluation today patient appears to be doing well with regard to her wound. Fortunately there does not appear to be any signs of active ?infection at this time which is great news. No fevers, chills, nausea, vomiting, or diarrhea. I do feel like the Annitta Needs is doing a good job although we need to ?make sure that they are put in a saline moistened gauze and behind to make sure this stays nice and moist it seems to be a little bit dry. ?04/02/2021 upon evaluation today patient's wound is actually showing signs of good improvement. Fortunately there does not appear to be any evidence of ?active infection locally nor systemically at this time which is great news. No fevers, chills, nausea, vomiting, or diarrhea. ?04/23/2021 upon evaluation today patient's wound is actually showing signs of being a little bit smaller which is good news and I think that we are headed in the ?right direction. Obviously this is something that is going to continue to slowly progress I believe and again as long as we keep it clean and keep it from getting ?infected that is going to be the big thing. ?05/14/2021: Today, the wound has contracted even more. There continues to be loose but somewhat thick yellow slough in the wound base. The wound is moist. ?06-04-2021 upon evaluation today patient appears to be doing well currently in regard to her heel ulcer. I am actually extremely pleased with where we stand and I ?think the patient is making good progress here. There does not appear to be any signs of active infection locally or systemically which is great news. ?06-25-2021 upon evaluation today patient's wound is actually showing signs of excellent improvement. I am actually very pleased with where we stand and I ?think the patient is making excellent progress. There does  not appear to be any signs of active infection at this time. No fevers, chills, nausea, vomiting, or ?diarrhea. ?Electronic Signature(s) ?Signed: 06/25/2021 3:47:29 PM By: Worthy Keeler PA-C ?Entered By: Worthy Keeler on 06/25/2021 15:47:29 ?-------------------------------------------------------------------------------- ?Physical Exam Details ?Patient Name: Date of Service: ?[name], [name] 06/25/2021 2:00 PM ?Medical Record Number: [id number] ?Patient Account Number: [id number] ?Date of Birth/Sex: Treating RN: ?09-Feb-1923 (88 y.o. [name], [name] ?Primary Care Provider: Aretta Nip Other Clinician: ?Referring Provider: ?Treating Provider/Extender: Worthy Keeler ?[place], [place] ?Weeks in Treatment: 24 ?Constitutional ?Well-nourished and well-hydrated in no acute distress. ?Respiratory ?normal breathing without difficulty. ?Psychiatric ?this patient is able to make decisions and demonstrates good insight into disease process. Alert and Oriented x 3. pleasant and cooperative. ?Notes ?Upon evaluation today patient's wound is actually showing signs of excellent improvement I am actually very pleased with where we stand today I do not see ?any signs of infection which is great news as well. ?Electronic Signature(s) ?Signed: 06/25/2021 3:49:04 PM By: Worthy Keeler PA-C ?Entered By: Worthy Keeler on 06/25/2021 15:49:04 ?-------------------------------------------------------------------------------- ?Physician Orders Details ?Patient Name: Date of Service: ?[name], [name] 06/25/2021 2:00 PM ?Medical Record Number: [id number] ?Patient Account Number: [id number] ?Date of Birth/Sex: Treating RN: ?Apr 08, 1922 (88 y.o. [name], [name] ?Primary Care Provider: Aretta Nip Other Clinician: ?Referring Provider: ?Treating Provider/Extender: Worthy Keeler ?[place], [place] ?Weeks in Treatment: 24 ?Verbal / Phone Orders: No ?Diagnosis Coding ?  ICD-10 Coding ?Code Description ?L89.620  Pressure ulcer of left heel, unstageable ?I10 Essential (primary) hypertension ?J44.9 Chronic obstructive pulmonary disease, unspecified ?M62.81 Muscle weakness (generalized) ?I48.0 Paroxysmal atrial fibrillation ?Fo

## 2021-06-26 DIAGNOSIS — M79602 Pain in left arm: Secondary | ICD-10-CM | POA: Diagnosis not present

## 2021-06-26 DIAGNOSIS — E200 Idiopathic hypoparathyroidism: Secondary | ICD-10-CM | POA: Diagnosis not present

## 2021-06-26 DIAGNOSIS — Z7689 Persons encountering health services in other specified circumstances: Secondary | ICD-10-CM | POA: Diagnosis not present

## 2021-06-26 DIAGNOSIS — E039 Hypothyroidism, unspecified: Secondary | ICD-10-CM | POA: Diagnosis not present

## 2021-06-26 DIAGNOSIS — L03114 Cellulitis of left upper limb: Secondary | ICD-10-CM | POA: Diagnosis not present

## 2021-07-04 DIAGNOSIS — M79622 Pain in left upper arm: Secondary | ICD-10-CM | POA: Diagnosis not present

## 2021-07-04 DIAGNOSIS — M1990 Unspecified osteoarthritis, unspecified site: Secondary | ICD-10-CM | POA: Diagnosis not present

## 2021-07-04 DIAGNOSIS — R630 Anorexia: Secondary | ICD-10-CM | POA: Diagnosis not present

## 2021-07-08 DIAGNOSIS — I509 Heart failure, unspecified: Secondary | ICD-10-CM | POA: Diagnosis not present

## 2021-07-08 DIAGNOSIS — F330 Major depressive disorder, recurrent, mild: Secondary | ICD-10-CM | POA: Diagnosis not present

## 2021-07-08 DIAGNOSIS — J449 Chronic obstructive pulmonary disease, unspecified: Secondary | ICD-10-CM | POA: Diagnosis not present

## 2021-07-08 DIAGNOSIS — R0602 Shortness of breath: Secondary | ICD-10-CM | POA: Diagnosis not present

## 2021-07-09 DIAGNOSIS — I509 Heart failure, unspecified: Secondary | ICD-10-CM | POA: Diagnosis not present

## 2021-07-09 DIAGNOSIS — Z7689 Persons encountering health services in other specified circumstances: Secondary | ICD-10-CM | POA: Diagnosis not present

## 2021-07-10 DIAGNOSIS — I5022 Chronic systolic (congestive) heart failure: Secondary | ICD-10-CM | POA: Diagnosis not present

## 2021-07-10 DIAGNOSIS — Z7689 Persons encountering health services in other specified circumstances: Secondary | ICD-10-CM | POA: Diagnosis not present

## 2021-07-16 ENCOUNTER — Encounter (HOSPITAL_BASED_OUTPATIENT_CLINIC_OR_DEPARTMENT_OTHER): Payer: Medicare PPO | Attending: Physician Assistant | Admitting: Physician Assistant

## 2021-07-23 ENCOUNTER — Encounter (HOSPITAL_BASED_OUTPATIENT_CLINIC_OR_DEPARTMENT_OTHER): Payer: Medicare PPO | Attending: Physician Assistant | Admitting: Physician Assistant

## 2021-07-23 DIAGNOSIS — I10 Essential (primary) hypertension: Secondary | ICD-10-CM | POA: Insufficient documentation

## 2021-07-23 DIAGNOSIS — I480 Paroxysmal atrial fibrillation: Secondary | ICD-10-CM | POA: Diagnosis not present

## 2021-07-23 DIAGNOSIS — L89620 Pressure ulcer of left heel, unstageable: Secondary | ICD-10-CM | POA: Diagnosis not present

## 2021-07-23 DIAGNOSIS — J449 Chronic obstructive pulmonary disease, unspecified: Secondary | ICD-10-CM | POA: Diagnosis not present

## 2021-07-23 DIAGNOSIS — M6281 Muscle weakness (generalized): Secondary | ICD-10-CM | POA: Diagnosis not present

## 2021-07-23 NOTE — Progress Notes (Signed)
[name] ([id number]) Visit Report for 07/23/2021 Chief Complaint Document Details Patient Name: Date of Service: [name], [name] 07/23/2021 2:30 PM Medical Record Number: [id number] Patient Account Number: [id number] Date of Birth/Sex: Treating RN: 09/01/1922 (88 y.o. [name], [name] Primary Care Provider: Aretta Nip Other Clinician: Referring Provider: Treating Provider/Extender: Cloyd Stagers in Treatment: 28 Information Obtained from: Patient Chief Complaint Left heel ulcer Electronic Signature(s) Signed: 07/23/2021 2:46:46 PM By: Worthy Keeler PA-C Entered By: Worthy Keeler on 07/23/2021 14:46:46 -------------------------------------------------------------------------------- HPI Details Patient Name: Date of Service: [name], [name] Sarna R. 07/23/2021 2:30 PM Medical Record Number: [id number] Patient Account Number: [id number] Date of Birth/Sex: Treating RN: September 28, 1922 (88 y.o. [name], [name] Primary Care Provider: Aretta Nip Other Clinician: Referring Provider: Treating Provider/Extender: Cloyd Stagers in Treatment: 28 History of Present Illness HPI Description: 01/08/2021 patient presents today for evaluation of a pressure ulcer which began after she fell in July and broke her arm. This unfortunately has led to her being overall more generally weak and she is not really up and moving around much at all. While she was in the hospital she developed the heel ulcers bilaterally the right was more deep tissue injury that pretty much appears to be healed at this point. At the current skilled nursing facility they have actually been doing a great job taking care of her according to her friend who also helps take care of her. Subsequently the patient's wound on the left heel is eschar covered and probably would benefit from clearing away this eschar so that she can actually see some  improvements hopefully. Currently they have been utilizing Iodosorb along with a border foam dressing. Patient has a history of hypertension, COPD, generalized muscle weakness, and atrial fibrillation. Due to her age they elected not to place her on any blood thinners for the atrial fibrillation. 01/15/2021 upon evaluation today patient appears to be doing better in regard to the heel ulcer. I do not have initially upon evaluation today her arterial studies that were done at the facility. Therefore I did not perform any sharp debridement while she was present during the office visit today. Nonetheless I am going to suggest based on what was seen currently that we probably once we get this with you at everything appears to be okay consider stop debridement at the next visit. 01/29/2021 upon evaluation today patient appears to be doing well with regard to her wound all things considered her blood flow is definitely not very good based on the review of the arterial study although it was really incomplete in my opinion there was not even a brachial reading to compare to but nonetheless the readings in the extremities were very poor. In the end I did actually discuss with her going ahead and going to vascular and they have already get that appointment set up which is great news. Fortunately there does not appear to be signs of infection currently. The wound is really not making tremendous progress but again I would not expect it to do so with where things stand currently until we can get this moving a little bit better direction. 02/19/2021 upon evaluation today patient's wound actually showing signs of doing about the same the still very thick region of eschar noted currently. I do think that based on the fact that there really is nothing from a vascular standpoint recommend currently which I completely understand that this is still something  that is going to require as aggressive as we can wound care  obviously I am not going to perform any debridement but nonetheless I do think that good wound care including Santyl can be beneficial for the patient currently. 03/12/2021 upon evaluation today patient appears to be doing well with regard to her wound. Fortunately there does not appear to be any signs of active infection at this time which is great news. No fevers, chills, nausea, vomiting, or diarrhea. I do feel like the [facility] is doing a good job although we need to make sure that they are put in a saline moistened gauze and behind to make sure this stays nice and moist it seems to be a little bit dry. 04/02/2021 upon evaluation today patient's wound is actually showing signs of good improvement. Fortunately there does not appear to be any evidence of active infection locally nor systemically at this time which is great news. No fevers, chills, nausea, vomiting, or diarrhea. 04/23/2021 upon evaluation today patient's wound is actually showing signs of being a little bit smaller which is good news and I think that we are headed in the right direction. Obviously this is something that is going to continue to slowly progress I believe and again as long as we keep it clean and keep it from getting infected that is going to be the big thing. 05/14/2021: Today, the wound has contracted even more. There continues to be loose but somewhat thick yellow slough in the wound base. The wound is moist. 06-04-2021 upon evaluation today patient appears to be doing well currently in regard to her heel ulcer. I am actually extremely pleased with where we stand and I think the patient is making good progress here. There does not appear to be any signs of active infection locally or systemically which is great news. 06-25-2021 upon evaluation today patient's wound is actually showing signs of excellent improvement. I am actually very pleased with where we stand and I think the patient is making excellent progress. There does  not appear to be any signs of active infection at this time. No fevers, chills, nausea, vomiting, or diarrhea. 07-23-2021 upon evaluation today patient's wound actually appears to be doing quite well. Fortunately there does not appear to be any evidence of active infection locally nor systemically which is great news. No fevers, chills, nausea, vomiting, or diarrhea. Electronic Signature(s) Signed: 07/23/2021 3:07:06 PM By: Worthy Keeler PA-C Entered By: Worthy Keeler on 07/23/2021 15:07:05 -------------------------------------------------------------------------------- Physical Exam Details Patient Name: Date of Service: [name], [name] 07/23/2021 2:30 PM Medical Record Number: [id number] Patient Account Number: [id number] Date of Birth/Sex: Treating RN: 1923-02-21 (88 y.o. [name] Primary Care Provider: Aretta Nip Other Clinician: Referring Provider: Treating Provider/Extender: Otis Brace Weeks in Treatment: 1 Constitutional Well-nourished and well-hydrated in no acute distress. Respiratory normal breathing without difficulty. Psychiatric this patient is able to make decisions and demonstrates good insight into disease process. Alert and Oriented x 3. pleasant and cooperative. Notes Upon evaluation patient's wound actually showed signs of good granulation and epithelization at this point. Fortunately there did not appear to be any evidence of active infection at this time which is great news and overall I am extremely pleased with where we stand in fact as I remove some of the dry callus the patient had no open wound underneath which is awesome news. Electronic Signature(s) Signed: 07/23/2021 3:07:39 PM By: Worthy Keeler PA-C Entered By: Melburn Hake,  Margarita Grizzle on 07/23/2021 15:07:39 -------------------------------------------------------------------------------- Physician Orders Details Patient Name: Date of Service: [name], [name] 07/23/2021 2:30 PM Medical Record Number: [id number] Patient Account Number: [id number] Date of Birth/Sex: Treating RN: 01/26/1923 (88 y.o. [name], [name] Primary Care Provider: Aretta Nip Other Clinician: Referring Provider: Treating Provider/Extender: Cloyd Stagers in Treatment: 28 Verbal / Phone Orders: No Diagnosis Coding ICD-10 Coding Code Description L89.620 Pressure ulcer of left heel, unstageable I10 Essential (primary) hypertension J44.9 Chronic obstructive pulmonary disease, unspecified M62.81 Muscle weakness (generalized) I48.0 Paroxysmal atrial fibrillation Discharge From [facility] Services Discharge from [place] Signature(s) Signed: 07/23/2021 3:02:16 PM By: Rhae Hammock RN Signed: 07/23/2021 5:33:59 PM By: Worthy Keeler PA-C Entered By: Rhae Hammock on 07/23/2021 14:51:37 -------------------------------------------------------------------------------- Problem List Details Patient Name: Date of Service: [name], Stan[place]. 07/23/2021 2:30 PM Medical Record Number: [id number] Patient Account Number: [id number] Date of Birth/Sex: Treating RN: Jun 13, 1922 (88 y.o. [name], [name] Primary Care Provider: Aretta Nip Other Clinician: Referring Provider: Treating Provider/Extender: Cloyd Stagers in Treatment: 28 Active Problems ICD-10 Encounter Code Description Active Date MDM Diagnosis L89.620 Pressure ulcer of left heel, unstageable 01/08/2021 No Yes I10 Essential (primary) hypertension 01/08/2021 No Yes J44.9 Chronic obstructive pulmonary disease, unspecified 01/08/2021 No Yes M62.81 Muscle weakness (generalized) 01/08/2021 No Yes I48.0 Paroxysmal atrial fibrillation 01/08/2021 No Yes Inactive Problems Resolved Problems Electronic Signature(s) Signed: 07/23/2021 2:46:40 PM By: Worthy Keeler PA-C Entered By: Worthy Keeler on 07/23/2021  14:46:40 -------------------------------------------------------------------------------- Progress Note Details Patient Name: Date of Service: [name], [name] Pintado. 07/23/2021 2:30 PM Medical Record Number: [id number] Patient Account Number: [id number] Date of Birth/Sex: Treating RN: [birth year] (88 y.o. [name], [name] Primary Care Provider: Aretta Nip Other Clinician: Referring Provider: Treating Provider/Extender: Cloyd Stagers in Treatment: 28 Subjective Chief Complaint Information obtained from Patient Left heel ulcer History of Present Illness (HPI) 01/08/2021 patient presents today for evaluation of a pressure ulcer which began after she fell in July and broke her arm. This unfortunately has led to her being overall more generally weak and she is not really up and moving around much at all. While she was in the hospital she developed the heel ulcers bilaterally the right was more deep tissue injury that pretty much appears to be healed at this point. At the current skilled nursing facility they have actually been doing a great job taking care of her according to her friend who also helps take care of her. Subsequently the patient's wound on the left heel is eschar covered and probably would benefit from clearing away this eschar so that she can actually see some improvements hopefully. Currently they have been utilizing Iodosorb along with a border foam dressing. Patient has a history of hypertension, COPD, generalized muscle weakness, and atrial fibrillation. Due to her age they elected not to place her on any blood thinners for the atrial fibrillation. 01/15/2021 upon evaluation today patient appears to be doing better in regard to the heel ulcer. I do not have initially upon evaluation today her arterial studies that were done at the facility. Therefore I did not perform any sharp debridement while she was present during the office visit today.  Nonetheless I am going to suggest based on what was seen currently that we probably once we get this with you at everything appears to be okay consider stop debridement at the next visit. 01/29/2021 upon evaluation  today patient appears to be doing well with regard to her wound all things considered her blood flow is definitely not very good based on the review of the arterial study although it was really incomplete in my opinion there was not even a brachial reading to compare to but nonetheless the readings in the extremities were very poor. In the end I did actually discuss with her going ahead and going to vascular and they have already get that appointment set up which is great news. Fortunately there does not appear to be signs of infection currently. The wound is really not making tremendous progress but again I would not expect it to do so with where things stand currently until we can get this moving a little bit better direction. 02/19/2021 upon evaluation today patient's wound actually showing signs of doing about the same the still very thick region of eschar noted currently. I do think that based on the fact that there really is nothing from a vascular standpoint recommend currently which I completely understand that this is still something that is going to require as aggressive as we can wound care obviously I am not going to perform any debridement but nonetheless I do think that good wound care including Santyl can be beneficial for the patient currently. 03/12/2021 upon evaluation today patient appears to be doing well with regard to her wound. Fortunately there does not appear to be any signs of active infection at this time which is great news. No fevers, chills, nausea, vomiting, or diarrhea. I do feel like the [facility] is doing a good job although we need to make sure that they are put in a saline moistened gauze and behind to make sure this stays nice and moist it seems to be a  little bit dry. 04/02/2021 upon evaluation today patient's wound is actually showing signs of good improvement. Fortunately there does not appear to be any evidence of active infection locally nor systemically at this time which is great news. No fevers, chills, nausea, vomiting, or diarrhea. 04/23/2021 upon evaluation today patient's wound is actually showing signs of being a little bit smaller which is good news and I think that we are headed in the right direction. Obviously this is something that is going to continue to slowly progress I believe and again as long as we keep it clean and keep it from getting infected that is going to be the big thing. 05/14/2021: Today, the wound has contracted even more. There continues to be loose but somewhat thick yellow slough in the wound base. The wound is moist. 06-04-2021 upon evaluation today patient appears to be doing well currently in regard to her heel ulcer. I am actually extremely pleased with where we stand and I think the patient is making good progress here. There does not appear to be any signs of active infection locally or systemically which is great news. 06-25-2021 upon evaluation today patient's wound is actually showing signs of excellent improvement. I am actually very pleased with where we stand and I think the patient is making excellent progress. There does not appear to be any signs of active infection at this time. No fevers, chills, nausea, vomiting, or diarrhea. 07-23-2021 upon evaluation today patient's wound actually appears to be doing quite well. Fortunately there does not appear to be any evidence of active infection locally nor systemically which is great news. No fevers, chills, nausea, vomiting, or diarrhea. Objective Constitutional Well-nourished and well-hydrated in no acute distress. Vitals Time  Taken: 2:34 PM, Temperature: 98.6 F, Pulse: 76 bpm, Respiratory Rate: 18 breaths/min, Blood Pressure: 150/66  mmHg. Respiratory normal breathing without difficulty. Psychiatric this patient is able to make decisions and demonstrates good insight into disease process. Alert and Oriented x 3. pleasant and cooperative. General Notes: Upon evaluation patient's wound actually showed signs of good granulation and epithelization at this point. Fortunately there did not appear to be any evidence of active infection at this time which is great news and overall I am extremely pleased with where we stand in fact as I remove some of the dry callus the patient had no open wound underneath which is awesome news. Integumentary (Hair, Skin) Wound #1 status is Healed - Epithelialized. Original cause of wound was Pressure Injury. The date acquired was: 09/26/2020. The wound has been in treatment 28 weeks. The wound is located on the Left Calcaneus. The wound measures 0cm length x 0cm width x 0cm depth; 0cm^2 area and 0cm^3 volume. There is Fat Layer (Subcutaneous Tissue) exposed. There is no tunneling or undermining noted. There is a medium amount of serosanguineous drainage noted. The wound margin is distinct with the outline attached to the wound base. There is no granulation within the wound bed. There is a large (67-100%) amount of necrotic tissue within the wound bed. Assessment Active Problems ICD-10 Pressure ulcer of left heel, unstageable Essential (primary) hypertension Chronic obstructive pulmonary disease, unspecified Muscle weakness (generalized) Paroxysmal atrial fibrillation Plan Discharge From [facility] Services: Discharge from [place] 1. I am good recommend that we going discontinue wound care services as the patient appears to be clinically healed. 2. I am also can recommend that we have the patient continue to monitor for any signs of worsening or infection. Obviously if anything changes she should let me know otherwise my hope is that this will continue to remain closed her friend is going to  keep a close eye on this as well. We will see her back for follow-up visit as needed only. Electronic Signature(s) Signed: 07/23/2021 3:08:06 PM By: Worthy Keeler PA-C Entered By: Worthy Keeler on 07/23/2021 15:08:06 -------------------------------------------------------------------------------- SuperBill Details Patient Name: Date of Service: [name], [name] Dubin. 07/23/2021 Medical Record Number: [id number] Patient Account Number: [id number] Date of Birth/Sex: Treating RN: [birth year] (88 y.o. [name], [name] Primary Care Provider: Aretta Nip Other Clinician: Referring Provider: Treating Provider/Extender: Cloyd Stagers in Treatment: 28 Diagnosis Coding ICD-10 Codes Code Description [id number] Pressure ulcer of left heel, unstageable I10 Essential (primary) hypertension J44.9 Chronic obstructive pulmonary disease, unspecified M62.81 Muscle weakness (generalized) I48.0 Paroxysmal atrial fibrillation Facility Procedures CPT4 Code: [id number] Description: 99213 - WOUND CARE VISIT-LEV 3 EST PT Modifier: Quantity: 1 Physician Procedures : CPT4 Code Description Modifier [id number] - WC PHYS LEVEL 3 - EST PT ICD-10 Diagnosis Description L89.620 Pressure ulcer of left heel, unstageable I10 Essential (primary) hypertension J44.9 Chronic obstructive pulmonary disease, unspecified M62.81  Muscle weakness (generalized) Quantity: 1 Electronic Signature(s) Signed: 07/23/2021 3:08:27 PM By: Worthy Keeler PA-C Previous Signature: 07/23/2021 3:02:16 PM Version By: Rhae Hammock RN Entered By: Worthy Keeler on 07/23/2021 15:08:27

## 2021-08-06 ENCOUNTER — Emergency Department (HOSPITAL_COMMUNITY): Payer: Medicare PPO

## 2021-08-06 ENCOUNTER — Encounter (HOSPITAL_COMMUNITY): Payer: Self-pay | Admitting: Emergency Medicine

## 2021-08-06 ENCOUNTER — Other Ambulatory Visit: Payer: Self-pay

## 2021-08-06 ENCOUNTER — Inpatient Hospital Stay (HOSPITAL_COMMUNITY)
Admission: EM | Admit: 2021-08-06 | Discharge: 2021-08-11 | DRG: 481 | Disposition: A | Discharge status: Skilled Nursing Facility | Payer: Medicare PPO | Source: Skilled Nursing Facility | Attending: Internal Medicine | Admitting: Internal Medicine

## 2021-08-06 DIAGNOSIS — M1712 Unilateral primary osteoarthritis, left knee: Secondary | ICD-10-CM | POA: Diagnosis not present

## 2021-08-06 DIAGNOSIS — M9711XA Periprosthetic fracture around internal prosthetic right knee joint, initial encounter: Secondary | ICD-10-CM

## 2021-08-06 DIAGNOSIS — Z885 Allergy status to narcotic agent status: Secondary | ICD-10-CM | POA: Diagnosis not present

## 2021-08-06 DIAGNOSIS — Z853 Personal history of malignant neoplasm of breast: Secondary | ICD-10-CM

## 2021-08-06 DIAGNOSIS — M25551 Pain in right hip: Secondary | ICD-10-CM | POA: Diagnosis not present

## 2021-08-06 DIAGNOSIS — Z9181 History of falling: Secondary | ICD-10-CM | POA: Diagnosis not present

## 2021-08-06 DIAGNOSIS — Z96652 Presence of left artificial knee joint: Secondary | ICD-10-CM | POA: Diagnosis present

## 2021-08-06 DIAGNOSIS — I110 Hypertensive heart disease with heart failure: Secondary | ICD-10-CM | POA: Diagnosis not present

## 2021-08-06 DIAGNOSIS — M47812 Spondylosis without myelopathy or radiculopathy, cervical region: Secondary | ICD-10-CM | POA: Diagnosis not present

## 2021-08-06 DIAGNOSIS — M6281 Muscle weakness (generalized): Secondary | ICD-10-CM | POA: Diagnosis not present

## 2021-08-06 DIAGNOSIS — I482 Chronic atrial fibrillation, unspecified: Secondary | ICD-10-CM | POA: Diagnosis not present

## 2021-08-06 DIAGNOSIS — M19012 Primary osteoarthritis, left shoulder: Secondary | ICD-10-CM | POA: Diagnosis present

## 2021-08-06 DIAGNOSIS — I5030 Unspecified diastolic (congestive) heart failure: Secondary | ICD-10-CM | POA: Diagnosis present

## 2021-08-06 DIAGNOSIS — I723 Aneurysm of iliac artery: Secondary | ICD-10-CM | POA: Diagnosis not present

## 2021-08-06 DIAGNOSIS — K5900 Constipation, unspecified: Secondary | ICD-10-CM | POA: Diagnosis not present

## 2021-08-06 DIAGNOSIS — I672 Cerebral atherosclerosis: Secondary | ICD-10-CM | POA: Diagnosis not present

## 2021-08-06 DIAGNOSIS — R6889 Other general symptoms and signs: Secondary | ICD-10-CM | POA: Diagnosis not present

## 2021-08-06 DIAGNOSIS — G8929 Other chronic pain: Secondary | ICD-10-CM | POA: Diagnosis present

## 2021-08-06 DIAGNOSIS — Z9981 Dependence on supplemental oxygen: Secondary | ICD-10-CM

## 2021-08-06 DIAGNOSIS — M4319 Spondylolisthesis, multiple sites in spine: Secondary | ICD-10-CM | POA: Diagnosis not present

## 2021-08-06 DIAGNOSIS — I5032 Chronic diastolic (congestive) heart failure: Secondary | ICD-10-CM | POA: Diagnosis not present

## 2021-08-06 DIAGNOSIS — R52 Pain, unspecified: Secondary | ICD-10-CM | POA: Diagnosis not present

## 2021-08-06 DIAGNOSIS — W06XXXA Fall from bed, initial encounter: Secondary | ICD-10-CM | POA: Diagnosis present

## 2021-08-06 DIAGNOSIS — S72491A Other fracture of lower end of right femur, initial encounter for closed fracture: Secondary | ICD-10-CM | POA: Diagnosis not present

## 2021-08-06 DIAGNOSIS — S79911A Unspecified injury of right hip, initial encounter: Secondary | ICD-10-CM | POA: Diagnosis not present

## 2021-08-06 DIAGNOSIS — D649 Anemia, unspecified: Secondary | ICD-10-CM | POA: Diagnosis not present

## 2021-08-06 DIAGNOSIS — J449 Chronic obstructive pulmonary disease, unspecified: Secondary | ICD-10-CM | POA: Diagnosis not present

## 2021-08-06 DIAGNOSIS — I739 Peripheral vascular disease, unspecified: Secondary | ICD-10-CM | POA: Diagnosis not present

## 2021-08-06 DIAGNOSIS — M25461 Effusion, right knee: Secondary | ICD-10-CM | POA: Diagnosis not present

## 2021-08-06 DIAGNOSIS — Z993 Dependence on wheelchair: Secondary | ICD-10-CM | POA: Diagnosis not present

## 2021-08-06 DIAGNOSIS — Z7401 Bed confinement status: Secondary | ICD-10-CM | POA: Diagnosis not present

## 2021-08-06 DIAGNOSIS — Z79899 Other long term (current) drug therapy: Secondary | ICD-10-CM

## 2021-08-06 DIAGNOSIS — I10 Essential (primary) hypertension: Secondary | ICD-10-CM

## 2021-08-06 DIAGNOSIS — R531 Weakness: Secondary | ICD-10-CM | POA: Diagnosis not present

## 2021-08-06 DIAGNOSIS — Z888 Allergy status to other drugs, medicaments and biological substances status: Secondary | ICD-10-CM

## 2021-08-06 DIAGNOSIS — S7001XA Contusion of right hip, initial encounter: Secondary | ICD-10-CM

## 2021-08-06 DIAGNOSIS — R2689 Other abnormalities of gait and mobility: Secondary | ICD-10-CM | POA: Diagnosis not present

## 2021-08-06 DIAGNOSIS — S72401A Unspecified fracture of lower end of right femur, initial encounter for closed fracture: Secondary | ICD-10-CM

## 2021-08-06 DIAGNOSIS — Z6831 Body mass index (BMI) 31.0-31.9, adult: Secondary | ICD-10-CM | POA: Diagnosis not present

## 2021-08-06 DIAGNOSIS — M79652 Pain in left thigh: Secondary | ICD-10-CM | POA: Diagnosis not present

## 2021-08-06 DIAGNOSIS — S8992XA Unspecified injury of left lower leg, initial encounter: Secondary | ICD-10-CM | POA: Diagnosis not present

## 2021-08-06 DIAGNOSIS — E669 Obesity, unspecified: Secondary | ICD-10-CM | POA: Diagnosis present

## 2021-08-06 DIAGNOSIS — I4891 Unspecified atrial fibrillation: Secondary | ICD-10-CM | POA: Diagnosis present

## 2021-08-06 DIAGNOSIS — Z96643 Presence of artificial hip joint, bilateral: Secondary | ICD-10-CM | POA: Diagnosis present

## 2021-08-06 DIAGNOSIS — J9611 Chronic respiratory failure with hypoxia: Secondary | ICD-10-CM | POA: Diagnosis not present

## 2021-08-06 DIAGNOSIS — W19XXXA Unspecified fall, initial encounter: Principal | ICD-10-CM

## 2021-08-06 DIAGNOSIS — S72301A Unspecified fracture of shaft of right femur, initial encounter for closed fracture: Secondary | ICD-10-CM | POA: Diagnosis not present

## 2021-08-06 DIAGNOSIS — S0990XA Unspecified injury of head, initial encounter: Secondary | ICD-10-CM | POA: Diagnosis not present

## 2021-08-06 DIAGNOSIS — M25572 Pain in left ankle and joints of left foot: Secondary | ICD-10-CM | POA: Diagnosis not present

## 2021-08-06 DIAGNOSIS — M79602 Pain in left arm: Secondary | ICD-10-CM | POA: Diagnosis present

## 2021-08-06 DIAGNOSIS — M79651 Pain in right thigh: Secondary | ICD-10-CM | POA: Diagnosis not present

## 2021-08-06 DIAGNOSIS — M25552 Pain in left hip: Secondary | ICD-10-CM | POA: Diagnosis not present

## 2021-08-06 DIAGNOSIS — Z96651 Presence of right artificial knee joint: Secondary | ICD-10-CM | POA: Diagnosis not present

## 2021-08-06 DIAGNOSIS — S2242XA Multiple fractures of ribs, left side, initial encounter for closed fracture: Secondary | ICD-10-CM | POA: Diagnosis not present

## 2021-08-06 DIAGNOSIS — M978XXA Periprosthetic fracture around other internal prosthetic joint, initial encounter: Secondary | ICD-10-CM | POA: Diagnosis not present

## 2021-08-06 DIAGNOSIS — Z882 Allergy status to sulfonamides status: Secondary | ICD-10-CM | POA: Diagnosis not present

## 2021-08-06 DIAGNOSIS — M85862 Other specified disorders of bone density and structure, left lower leg: Secondary | ICD-10-CM | POA: Diagnosis not present

## 2021-08-06 DIAGNOSIS — S199XXA Unspecified injury of neck, initial encounter: Secondary | ICD-10-CM | POA: Diagnosis not present

## 2021-08-06 DIAGNOSIS — S72401D Unspecified fracture of lower end of right femur, subsequent encounter for closed fracture with routine healing: Secondary | ICD-10-CM | POA: Diagnosis not present

## 2021-08-06 DIAGNOSIS — R5383 Other fatigue: Secondary | ICD-10-CM | POA: Diagnosis not present

## 2021-08-06 DIAGNOSIS — S72351A Displaced comminuted fracture of shaft of right femur, initial encounter for closed fracture: Secondary | ICD-10-CM | POA: Diagnosis not present

## 2021-08-06 DIAGNOSIS — Z96642 Presence of left artificial hip joint: Secondary | ICD-10-CM | POA: Diagnosis not present

## 2021-08-06 DIAGNOSIS — Z743 Need for continuous supervision: Secondary | ICD-10-CM | POA: Diagnosis not present

## 2021-08-06 DIAGNOSIS — S4992XA Unspecified injury of left shoulder and upper arm, initial encounter: Secondary | ICD-10-CM | POA: Diagnosis not present

## 2021-08-06 DIAGNOSIS — M19011 Primary osteoarthritis, right shoulder: Secondary | ICD-10-CM | POA: Diagnosis not present

## 2021-08-06 DIAGNOSIS — M25561 Pain in right knee: Secondary | ICD-10-CM | POA: Diagnosis not present

## 2021-08-06 DIAGNOSIS — M7989 Other specified soft tissue disorders: Secondary | ICD-10-CM | POA: Diagnosis not present

## 2021-08-06 DIAGNOSIS — Z8249 Family history of ischemic heart disease and other diseases of the circulatory system: Secondary | ICD-10-CM

## 2021-08-06 HISTORY — DX: Failed or difficult intubation, initial encounter: T88.4XXA

## 2021-08-06 MED ORDER — HYDROCODONE-ACETAMINOPHEN 5-325 MG PO TABS
1.0000 | ORAL_TABLET | Freq: Once | ORAL | Status: AC
  Administered 2021-08-06: 1 via ORAL
  Filled 2021-08-06: qty 1

## 2021-08-06 MED ORDER — HYDROMORPHONE HCL 1 MG/ML IJ SOLN
0.5000 mg | INTRAMUSCULAR | Status: DC | PRN
  Administered 2021-08-07 – 2021-08-08 (×2): 0.5 mg via INTRAVENOUS
  Filled 2021-08-06 (×2): qty 0.5

## 2021-08-06 MED ORDER — ACETAMINOPHEN 650 MG RE SUPP: 650.0000 mg | Freq: Four times a day (QID) | RECTAL | Status: DC | PRN

## 2021-08-06 MED ORDER — CYCLOBENZAPRINE HCL 10 MG PO TABS
5.0000 mg | ORAL_TABLET | Freq: Once | ORAL | Status: AC
Start: 2021-08-06 — End: 2021-08-06
  Administered 2021-08-06: 5 mg via ORAL
  Filled 2021-08-06: qty 1

## 2021-08-06 MED ORDER — ALBUTEROL SULFATE (2.5 MG/3ML) 0.083% IN NEBU: 2.5000 mg | INHALATION_SOLUTION | RESPIRATORY_TRACT | Status: DC | PRN

## 2021-08-06 MED ORDER — HYDROCODONE-ACETAMINOPHEN 5-325 MG PO TABS
1.0000 | ORAL_TABLET | ORAL | Status: DC | PRN
  Administered 2021-08-07: 1 via ORAL
  Filled 2021-08-06: qty 1

## 2021-08-06 MED ORDER — ACETAMINOPHEN 325 MG PO TABS
650.0000 mg | ORAL_TABLET | Freq: Four times a day (QID) | ORAL | Status: DC | PRN
  Administered 2021-08-07 – 2021-08-10 (×5): 650 mg via ORAL
  Filled 2021-08-06 (×6): qty 2

## 2021-08-06 MED ORDER — HEPARIN SODIUM (PORCINE) 5000 UNIT/ML IJ SOLN
5000.0000 [IU] | Freq: Three times a day (TID) | INTRAMUSCULAR | Status: DC
  Administered 2021-08-07 – 2021-08-08 (×4): 5000 [IU] via SUBCUTANEOUS
  Filled 2021-08-06 (×4): qty 1

## 2021-08-06 NOTE — ED Triage Notes (Signed)
Pt coming from [facility] w/ c/o fall out of bed today. Pt reports r hip and shoulder pain. No obvious deformity. Denies LOC, denies thinners, denies hitting head.  194/80 68HR  98% 3L O2 - baseline

## 2021-08-06 NOTE — H&P (Signed)
History and Physical    KEYONIA GLUTH VQQ:[id number] DOB: [birth year] DOA: 08/06/2021  DOS: the patient was seen and examined on 08/06/2021  PCP: System, Provider Not In   Patient coming from: [facility]  I have personally briefly reviewed patient's old medical records in [place]  Chief complaint: fell out of bed History present illness: 88 year old white female history of hypertension, atrial fibrillation, currently bedbound status who lives at [facility] skilled nursing facility presents to the ER after a fall out of bed.  Family states that the patient needs a Hoyer lift to get out of bed and into a wheelchair.  Family states that patient needed to use the bathroom today.  She was rolled onto her side to be able to use a bedpan.  She rolled out of bed and onto the floor.  She had pain on the right thigh when she struck the floor.  She was brought to the ER.  On arrival temp 97.4 heart rate 64 blood pressure 171/89 satting high percent on room air.  X-rays showed a right femoral periprosthetic fracture around the prior right total knee arthroplasty.  Initially patient refused surgery.  She had been given p.o. Vicodin.  After orthopedics is discussed with the patient and family, the family and patient are now reconsidering operative intervention.  [facility] hospitalist contacted for admission.   ED Course: X-rays showed right femoral periprosthetic fracture.  Review of Systems:  Review of Systems  Constitutional: Negative.   HENT: Negative.    Eyes: Negative.   Respiratory:  Positive for shortness of breath.   Cardiovascular:  Positive for leg swelling.  Gastrointestinal: Negative.   Genitourinary: Negative.   Musculoskeletal:  Positive for joint pain.       Right thigh pain.  Skin: Negative.   Neurological: Negative.   Endo/Heme/Allergies:  Bruises/bleeds easily.  Psychiatric/Behavioral: Negative.    All other systems reviewed and are negative.  Past Medical  History:  Diagnosis Date   Atrial fibrillation ([place])    CARCINOMA, BREAST    CHEST PAIN    DEGENERATIVE JOINT DISEASE    Diastolic heart failure ([place]) 08/03/2008   Qualifier: Diagnosis of  By: Burnett Kanaris     Edema    GIB (gastrointestinal bleeding) 05/24/2015   Humerus fracture 09/12/2020   HYPERTENSION    Long term (current) use of anticoagulants    Malignant neoplasm of female breast ([place]) 08/03/2008   Qualifier: Diagnosis of  By: Burnett Kanaris     Unspecified diastolic heart failure     Past Surgical History:  Procedure Laterality Date   BREAST LUMPECTOMY     ESOPHAGOGASTRODUODENOSCOPY (EGD) WITH PROPOFOL Left 05/28/2015   Procedure: ESOPHAGOGASTRODUODENOSCOPY (EGD) WITH PROPOFOL;  Surgeon: Wilford Corner, MD;  Location[facility] ENDOSCOPY;  Service: Endoscopy;  Laterality: Left;   HIP SURGERY     KNEE SURGERY     LAMINECTOMY     TONSILLECTOMY       reports that she has never smoked. She has never used smokeless tobacco. She reports that she does not drink alcohol and does not use drugs.  Allergies  Allergen Reactions   Morphine And Related Other (See Comments)    Other Reaction: mild Intolerance, Allergy   Sulfa Antibiotics     Other reaction(s): Other (See Comments) Other Reaction: mild Intolerance, Allergy   Meperidine Nausea And Vomiting and Other (See Comments)   Meperidine Hcl Nausea Only   Nsaids     Other reaction(s): Other (See Comments) Bleeding ulcer  Tolmetin Other (See Comments)    Other reaction(s): Other (See Comments) Bleeding ulcer    Family History  Problem Relation Age of Onset   CAD Mother    Heart attack Mother    Hypertension Mother    CAD Father    Stroke Brother    Heart attack Brother     Prior to Admission medications   Medication Sig Start Date End Date Taking? Authorizing Provider  acetaminophen (TYLENOL) 325 MG tablet Take 2 tablets (650 mg total) by mouth every 6 (six) hours as needed for moderate pain. 09/19/20   Arrien,  Jimmy Picket, MD  calcium-vitamin D (OSCAL WITH D) 500-200 MG-UNIT per tablet Take 1 tablet by mouth daily.    [provider]  Cholecalciferol (VITAMIN D) 125 MCG (5000 UT) CAPS Take 5,000 Units by mouth daily.    [provider]  diclofenac Sodium (VOLTAREN) 1 % GEL Apply 2 g topically 4 (four) times daily. Apply to left knee. 09/19/20   Arrien, Jimmy Picket, MD  docusate sodium (COLACE) 100 MG capsule Take 100 mg by mouth daily as needed for mild constipation.    [provider]  HYDROcodone-acetaminophen (NORCO/VICODIN) 5-325 MG tablet Take 1 tablet by mouth every 6 (six) hours as needed for severe pain. 09/19/20   Arrien, Jimmy Picket, MD  Misc. Devices (POSTURE SEAT) MISC Lift chair 01/19/20   Silverio Decamp, MD  Multiple Vitamins-Minerals (CENTRUM PO) Take 1 tablet by mouth daily.    [provider]  Omega-3 Fatty Acids (FISH OIL PO) Take 1 tablet by mouth daily.    [provider]  omeprazole (PRILOSEC) 20 MG capsule Take 20 mg by mouth every morning. 08/26/20   [provider]    Physical Exam: Vitals:   08/06/21 1529 08/06/21 1652 08/06/21 1802 08/06/21 1954  BP: (!) 153/63 133/79 119/75 (!) 141/71  Pulse: 61 85 84 83  Resp: '18 18 18 18  '$ Temp:      TempSrc:      SpO2: 100% 99% 100% 100%    Physical Exam Vitals and nursing note reviewed.  Constitutional:      General: She is not in acute distress.    Appearance: She is not toxic-appearing or diaphoretic.     Comments: Chronically ill-appearing  HENT:     Head: Normocephalic and atraumatic.     Nose: Nose normal.  Eyes:     General: No scleral icterus. Cardiovascular:     Rate and Rhythm: Normal rate. Rhythm irregular.  Pulmonary:     Effort: Pulmonary effort is normal.     Breath sounds: Normal breath sounds.  Abdominal:     General: Bowel sounds are normal. There is no distension.     Palpations: Abdomen is soft.     Tenderness: There is no  abdominal tenderness. There is no guarding or rebound.  Musculoskeletal:     Right lower leg: 1+ Pitting Edema present.     Left lower leg: 1+ Pitting Edema present.     Comments: Right knee immobilizer present.  Skin:    General: Skin is warm and dry.     Capillary Refill: Capillary refill takes less than 2 seconds.  Neurological:     General: No focal deficit present.     Mental Status: She is alert and oriented to person, place, and time.     Labs on Admission: I have personally reviewed following labs and imaging studies  CBC: No results for input(s): WBC, NEUTROABS, HGB, HCT,  MCV, PLT in the last 168 hours. Basic Metabolic Panel: No results for input(s): NA, K, CL, CO2, GLUCOSE, BUN, CREATININE, CALCIUM, MG, PHOS in the last 168 hours. GFR: CrCl cannot be calculated (Patient's most recent lab result is older than the maximum 21 days allowed.). Liver Function Tests: No results for input(s): AST, ALT, ALKPHOS, BILITOT, PROT, ALBUMIN in the last 168 hours. No results for input(s): LIPASE, AMYLASE in the last 168 hours. No results for input(s): AMMONIA in the last 168 hours. Coagulation Profile: No results for input(s): INR, PROTIME in the last 168 hours. Cardiac Enzymes: No results for input(s): CKTOTAL, CKMB, CKMBINDEX, TROPONINI, TROPONINIHS in the last 168 hours. BNP (last 3 results) No results for input(s): PROBNP in the last 8760 hours. HbA1C: No results for input(s): HGBA1C in the last 72 hours. CBG: No results for input(s): GLUCAP in the last 168 hours. Lipid Profile: No results for input(s): CHOL, HDL, LDLCALC, TRIG, CHOLHDL, LDLDIRECT in the last 72 hours. Thyroid Function Tests: No results for input(s): TSH, T4TOTAL, FREET4, T3FREE, THYROIDAB in the last 72 hours. Anemia Panel: No results for input(s): VITAMINB12, FOLATE, FERRITIN, TIBC, IRON, RETICCTPCT in the last 72 hours. Urine analysis:    Component Value Date/Time   COLORURINE YELLOW 11/11/2018 1009    APPEARANCEUR CLEAR 11/11/2018 1009   LABSPEC 1.008 11/11/2018 1009   PHURINE 6.0 11/11/2018 1009   GLUCOSEU NEGATIVE 11/11/2018 1009   HGBUR SMALL (A) 11/11/2018 1009   BILIRUBINUR NEGATIVE 11/11/2018 1009   KETONESUR NEGATIVE 11/11/2018 1009   PROTEINUR NEGATIVE 11/11/2018 1009   UROBILINOGEN 0.2 05/02/2012 0050   NITRITE NEGATIVE 11/11/2018 1009   LEUKOCYTESUR NEGATIVE 11/11/2018 1009    Radiological Exams on Admission: I have personally reviewed images DG Tibia/Fibula Left  Result Date: 08/06/2021 CLINICAL DATA:  Fall. EXAM: LEFT KNEE - COMPLETE 4+ VIEW; LEFT TIBIA AND FIBULA - 2 VIEW COMPARISON:  None Available. FINDINGS: Left knee: No acute fracture or dislocation. Chronic healed fracture of the proximal left fibula. Advanced left knee osteoarthritis with loss of joint space and bone-on-bone articulation in the lateral tibiofemoral compartments. Advanced patellofemoral osteoarthritis. Vascular calcifications. Soft tissue swelling about the knee. Left tibia/fibula: No acute fracture or dislocation. Chronic healed fracture of the proximal fibula as well as plate and screw fixation of the distal fibular fracture. Osteopenia. Prominent vascular calcifications. IMPRESSION: 1. No acute fracture or dislocation of the left knee and left tibia/fibula. 2. Chronic healed fracture of the proximal fibula as well as plate and surgical fixation of the distal fibular fracture. Osteopenia. 3.  Advanced knee osteoarthritis. Electronically Signed   By: Keane Police D.O.   On: 08/06/2021 16:57   CT HEAD WO CONTRAST (5MM)  Result Date: 08/06/2021 CLINICAL DATA:  Head trauma, moderate to severe.  Fell from bed. EXAM: CT HEAD WITHOUT CONTRAST TECHNIQUE: Contiguous axial images were obtained from the base of the skull through the vertex without intravenous contrast. RADIATION DOSE REDUCTION: This exam was performed according to the departmental dose-optimization program which includes automated exposure control,  adjustment of the mA and/or kV according to patient size and/or use of iterative reconstruction technique. COMPARISON:  11/11/2018 FINDINGS: Brain: Generalized atrophy. Chronic small-vessel ischemic changes of the white matter, progressive since the prior examination. Mild increase in ex vacuo enlargement of the lateral ventricles. Old right occipital infarction, not present on the prior exam. No evidence of acute infarction, mass lesion, hemorrhage or extra-axial collection. Vascular: There is atherosclerotic calcification of the major vessels at the base of the brain.  Skull: Negative Sinuses/Orbits: Clear/normal Other: None IMPRESSION: Generalized atrophy. Chronic small-vessel ischemic changes of the white matter. Slight increase in ventricular prominence since the prior exam, favored to represent central atrophy. Normal pressure hydrocephalus not completely excluded. Are there any clinical findings of that process? Old right occipital infarction, not present on the study of September 2020. Electronically Signed   By: Nelson Chimes M.D.   On: 08/06/2021 14:10   CT Cervical Spine Wo Contrast  Result Date: 08/06/2021 CLINICAL DATA:  Neck trauma.  Landed on right side. EXAM: CT CERVICAL SPINE WITHOUT CONTRAST TECHNIQUE: Multidetector CT imaging of the cervical spine was performed without intravenous contrast. Multiplanar CT image reconstructions were also generated. RADIATION DOSE REDUCTION: This exam was performed according to the departmental dose-optimization program which includes automated exposure control, adjustment of the mA and/or kV according to patient size and/or use of iterative reconstruction technique. COMPARISON:  None Available. FINDINGS: Alignment: Anatomic alignment. No static listhesis. 2 mm anterolisthesis of C3 on C4 and C7 on T1. Skull base and vertebrae: No acute fracture. No aggressive lytic or sclerotic osseous lesion. Generalized osteopenia. Soft tissues and spinal canal: Intraspinal soft  tissues are not fully imaged on this examination due to poor soft tissue contrast, but there is no gross soft tissue abnormality. No prevertebral fluid or swelling. No visible canal hematoma. Disc levels: Degenerative disease with disc height loss C3-4, C4-5, C5-6 and C6-7. bilateral facet arthropathy at C2-3, C3-4, C4-5, C5-6, C6-7 and C7-T1. Bilateral foraminal stenosis at C3-4. Bilateral foraminal stenosis at C4-5, C5-6, and C6-7. Upper chest: Lung apices are clear. Other: Severe osteoarthritis of the glenohumeral joints bilaterally. 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 IMPRESSION: 1.  No acute osseous injury of the cervical spine. 2. Cervical spine spondylosis as described above. Electronically Signed   By: Kathreen Devoid M.D.   On: 08/06/2021 17:11   CT PELVIS WO CONTRAST  Result Date: 08/06/2021 CLINICAL DATA:  Hip trauma. Fracture suspected. Slid off bed landing on right side. Right hip and left thigh pain. EXAM: CT PELVIS WITHOUT CONTRAST TECHNIQUE: Multidetector CT imaging of the pelvis was performed following the standard protocol without intravenous contrast. RADIATION DOSE REDUCTION: This exam was performed according to the departmental dose-optimization program which includes automated exposure control, adjustment of the mA and/or kV according to patient size and/or use of iterative reconstruction technique. COMPARISON:  Right femur radiographs 08/06/2021; left femur radiographs 08/06/2021; CT pelvis 05/02/2012 FINDINGS: Urinary Tract: Within the limitations of streak artifact, no gross bladder abnormality is seen. Bowel: The visualized bowel is grossly unremarkable. Normal appendix extending posteriorly in inferiorly from the cecum. Vascular/Lymphatic: There again moderate to high-grade atherosclerotic calcifications. There is again mild dilatation of the bilateral common iliac arteries measuring up to 1.6 cm. No lymphadenopathy is seen. Reproductive:  The uterus is present.   No gross adnexal abnormality. Other:  None Musculoskeletal: Status post bilateral total hip arthroplasty. Within the limitation of associated metallic streak artifact, no definite perihardware lucency is seen to indicate hardware failure or loosening. There is diffuse decreased bone mineralization. No definite acute fracture is seen within this limitation. Mild bilateral sacroiliac joint space narrowing and subchondral sclerosis. Mild-to-moderate pubic symphysis joint space narrowing and peripheral osteophytosis. Unchanged mild chronic enthesopathic change at the bilateral iliopsoas tendon insertions on the lesser trochanters. Moderate to severe L4-5 and L5-S1 facet joint arthropathy. There is high-grade fatty infiltration atrophy of the bilateral gluteus minimus musculature. Moderate atrophy and fatty infiltration of the bilateral gluteus medius musculature. Within  the limitations of the streak artifact, there appears to be mildly increased density within the visualized proximal lateral aspect of the vastus lateralis muscle (axial series 5, images 100 48 through 162, possibly an intramuscular hematoma. IMPRESSION: 1. Status post bilateral total hip arthroplasty. 2. No acute fracture is identified. Electronically Signed   By: Yvonne Kendall M.D.   On: 08/06/2021 17:59   DG Shoulder Left  Result Date: 08/06/2021 CLINICAL DATA:  [place] out of bed.  Left shoulder pain. EXAM: LEFT SHOULDER - 2+ VIEW COMPARISON:  Chest radiograph 03/26/2020 FINDINGS: Severe osteoarthritis with remodeling in the left glenohumeral joint. Evidence for heterotopic ossifications in the left shoulder. Left shoulder is located without acute fracture. No acute abnormality of the left AC joint. Evidence for old left rib fractures. IMPRESSION: 1. Severe degenerative changes in left shoulder. These findings are chronic. No acute bone abnormality. 2. Old left rib fractures. Electronically Signed   By: Markus Daft M.D.   On: 08/06/2021 14:05   DG  Knee Complete 4 Views Left  Result Date: 08/06/2021 CLINICAL DATA:  Fall. EXAM: LEFT KNEE - COMPLETE 4+ VIEW; LEFT TIBIA AND FIBULA - 2 VIEW COMPARISON:  None Available. FINDINGS: Left knee: No acute fracture or dislocation. Chronic healed fracture of the proximal left fibula. Advanced left knee osteoarthritis with loss of joint space and bone-on-bone articulation in the lateral tibiofemoral compartments. Advanced patellofemoral osteoarthritis. Vascular calcifications. Soft tissue swelling about the knee. Left tibia/fibula: No acute fracture or dislocation. Chronic healed fracture of the proximal fibula as well as plate and screw fixation of the distal fibular fracture. Osteopenia. Prominent vascular calcifications. IMPRESSION: 1. No acute fracture or dislocation of the left knee and left tibia/fibula. 2. Chronic healed fracture of the proximal fibula as well as plate and surgical fixation of the distal fibular fracture. Osteopenia. 3.  Advanced knee osteoarthritis. Electronically Signed   By: Keane Police D.O.   On: 08/06/2021 16:57   DG Knee Complete 4 Views Right  Result Date: 08/06/2021 CLINICAL DATA:  Status post fall, leg pain EXAM: RIGHT KNEE - COMPLETE 4+ VIEW; RIGHT FEMUR 2 VIEWS COMPARISON:  None Available. FINDINGS: Right total hip arthroplasty without failure or complication. Left knee arthroplasty. Periprosthetic distal femoral diametaphyseal fracture with severe comminution and 2 cm of anterior displacement. Scalloped appearance of the medial margin of the proximal tibial metaphysis likely postsurgical. No other fracture or dislocation.  No aggressive osseous lesion. Soft tissue are unremarkable. No radiopaque foreign body or soft tissue emphysema. IMPRESSION: 1. Right total hip arthroplasty without failure or complication. 2. Left knee arthroplasty. Periprosthetic distal femoral diametaphyseal fracture with severe comminution. Electronically Signed   By: Kathreen Devoid M.D.   On: 08/06/2021 17:01    DG Hip Unilat W or Wo Pelvis 2-3 Views Right  Result Date: 08/06/2021 CLINICAL DATA:  Fall.  Right hip pain EXAM: DG HIP (WITH OR WITHOUT PELVIS) 2-3V RIGHT COMPARISON:  04/05/2007, 05/02/2012 FINDINGS: Prior right total hip arthroplasty. Arthroplasty components are in their expected alignment. No periprosthetic lucency or fracture is identified. The bones are diffusely demineralized. IMPRESSION: 1. No acute fracture or dislocation. 2. Prior right total hip arthroplasty. Electronically Signed   By: Davina Poke D.O.   On: 08/06/2021 13:59   DG Femur Min 2 Views Left  Result Date: 08/06/2021 CLINICAL DATA:  [place] out of bed.  Reports left femoral pain. EXAM: LEFT FEMUR 2 VIEWS COMPARISON:  Left knee 09/12/2020 FINDINGS: Left hip arthroplasty is located without a periprosthetic fracture. Severe joint  space loss along the lateral knee compartment. Cannot exclude a small knee joint effusion. IMPRESSION: 1. No acute bone abnormality in left femur. Left hip is located without a periprosthetic fracture. 2. Severe osteoarthritis in the left knee particularly in the lateral compartment. Electronically Signed   By: Markus Daft M.D.   On: 08/06/2021 14:01   DG Femur Min 2 Views Right  Result Date: 08/06/2021 CLINICAL DATA:  Status post fall, leg pain EXAM: RIGHT KNEE - COMPLETE 4+ VIEW; RIGHT FEMUR 2 VIEWS COMPARISON:  None Available. FINDINGS: Right total hip arthroplasty without failure or complication. Left knee arthroplasty. Periprosthetic distal femoral diametaphyseal fracture with severe comminution and 2 cm of anterior displacement. Scalloped appearance of the medial margin of the proximal tibial metaphysis likely postsurgical. No other fracture or dislocation.  No aggressive osseous lesion. Soft tissue are unremarkable. No radiopaque foreign body or soft tissue emphysema. IMPRESSION: 1. Right total hip arthroplasty without failure or complication. 2. Left knee arthroplasty. Periprosthetic distal femoral  diametaphyseal fracture with severe comminution. Electronically Signed   By: Kathreen Devoid M.D.   On: 08/06/2021 17:01    EKG: My personal interpretation of EKG shows: no EKG    Assessment/Plan Principal Problem:   Periprosthetic fracture around internal prosthetic right knee joint Active Problems:   Essential hypertension   Atrial fibrillation (HCC)   Diastolic heart failure (HCC)    Assessment and Plan: * Periprosthetic fracture around internal prosthetic right knee joint Admit to inpatient MedSurg bed.  Orthopedics to see the patient in the morning to discuss ORIF.  N.p.o. after midnight.  Norco 5 mg every 4 hours as needed moderate pain, Dilaudid 0.5 mg IV every 4 hours as needed for severe pain.  Periprosthetic fracture around internal prosthetic right knee joint is a Acute illness/condition that poses a threat to life or bodily function.   Diastolic heart failure (HCC) Chronic.  Family states patient on daily Lasix at the nursing home.  Need med rec to be completed to verify this.  Atrial fibrillation (HCC) Stable.  Patient not on anticoagulants according to family.  Awaiting med rec to verify of MAR.  Essential hypertension Stable.  Awaiting home med rec to be completed.   DVT prophylaxis: SQ Heparin Code Status: Full Code Family Communication: discussed with pt and pt's brother at bedside  Disposition Plan: return to  [facility]  Consults called: EPD has consulted orthopedics who has seen the patient.  Admission status: Inpatient, Med-Surg   Kristopher Oppenheim, DO [facility] 08/06/2021, 10:03 PM

## 2021-08-06 NOTE — Subjective & Objective (Signed)
Chief complaint: fell out of bed History present illness: 88 year old white female history of hypertension, atrial fibrillation, currently bedbound status who lives at [facility] skilled nursing facility presents to the ER after a fall out of bed.  Family states that the patient needs a Hoyer lift to get out of bed and into a wheelchair.  Family states that patient needed to use the bathroom today.  She was rolled onto her side to be able to use a bedpan.  She rolled out of bed and onto the floor.  She had pain on the right thigh when she struck the floor.  She was brought to the ER.  On arrival temp 97.4 heart rate 64 blood pressure 171/89 satting high percent on room air.  X-rays showed a right femoral periprosthetic fracture around the prior right total knee arthroplasty.  Initially patient refused surgery.  She had been given p.o. Vicodin.  After orthopedics is discussed with the patient and family, the family and patient are now reconsidering operative intervention.  [facility] hospitalist contacted for admission.

## 2021-08-06 NOTE — ED Provider Notes (Addendum)
Care handoff from [facility], PA-C at shift change. Please see their note for further information.   Briefly: Patient presents today after a fall. States that she fell off a bedpan and landed on her right side earlier today. Denies hitting her head or LOC.  Plan: Patients original imaging of right hip, left femur, left shoulder, and CT head without acute abnormalities. Upon reassessment, patient also complains of right upper leg, right hip, left lower leg, pelvic and neck pain. Imaging obtained of same. Will reassess after this imaging has resulted.   CT cervical spine and CT pelvis without acute abnormalities.   DG right knee reveals periprosthetic distal femoral diametaphyseal fracture with severe comminution.  DG left femur and left knee negative for acute fracture or dislocation.  I have personally reviewed and interpreted these images and agree with radiology interpretation.  I discussed these findings with the patient who states that she would not like surgery performed on her knee.  Her knee replacement was performed by Dr. Frederik Pear many years ago.  She is not on anticoagulation. She is nonambulatory at baseline. I have placed patient in a knee immobilizer in the ER. DP and PT pulses intact and 2+.  Discussed the patient with orthopedics on-call Dr. Zachery Dakins who saw the patient and would like her to be admitted overnight for pain control and to have further evaluation in the morning to determine if the fracture will heal on its own with immobilization.   Discussed patient with hospitalist who agrees to admit.   Nestor Lewandowsky 08/06/21 2123    Bud Face, PA-C 08/06/21 2124    Regan Lemming, MD 08/06/21 2326

## 2021-08-06 NOTE — Assessment & Plan Note (Signed)
Chronic.  Family states patient on daily Lasix at the nursing home.  Need med rec to be completed to verify this.

## 2021-08-06 NOTE — Discharge Instructions (Addendum)
Orthopedic Discharge Instructions  Diet: As you were doing prior to hospitalization   Shower:  May shower but keep the wounds dry, use an occlusive plastic wrap, NO SOAKING IN TUB.    Dressing:  You may change your dressing 3-5 days after surgery, unless you have a splint.  If the dressing remains clean and dry it can also be left on until follow up. If you change the dressing replace with clean gauze and tape or ace wrap.   If you had hand or foot surgery, we will plan to remove your stitches in about 2 weeks in the office.  For all other surgeries, there are sticky tapes (steri-strips) on your wounds and all the stitches are absorbable.  Leave the steri-strips in place when changing your dressings, they will peel off with time, usually 2-3 weeks.  Activity:  Increase activity slowly as tolerated, but follow the weight bearing instructions below.  The rules on driving is that you can not be taking narcotics while you drive, and you must feel in control of the vehicle.    Weight Bearing:   nonweight bearing right lower extremity.  Range of motion of the hip and knee as tolerated.  Blood clot prevention (DVT Prophylaxis): After surgery you are at an increased risk for a blood clot. you were prescribed a blood thinner, Eliquis 2.'5mg'$  , to be taken twice daily for a total of 4 weeks from surgery to help reduce your risk of getting a blood clot. This will help prevent a blood clot. Signs of a pulmonary embolus (blood clot in the lungs) include sudden short of breath, feeling lightheaded or dizzy, chest pain with a deep breath, rapid pulse rapid breathing. Signs of a blood clot in your arms or legs include new unexplained swelling and cramping, warm, red or darkened skin around the painful area. Please call the office or 911 right away if these signs or symptoms develop. To prevent constipation: you may use a stool softener such as -  Colace (over the counter) 100 mg by mouth twice a day  Drink plenty of  fluids (prune juice may be helpful) and high fiber foods Miralax (over the counter) for constipation as needed.    Itching:  If you experience itching with your medications, try taking only a single pain pill, or even half a pain pill at a time.  You may take up to 10 pain pills per day, and you can also use benadryl over the counter for itching or also to help with sleep.   Precautions:  If you experience chest pain or shortness of breath - call 911 immediately for transfer to the hospital emergency department!!   Call office [phone]) for the following: Temperature greater than 101F Persistent nausea and vomiting Severe uncontrolled pain Redness, tenderness, or signs of infection (pain, swelling, redness, odor or green/yellow discharge around the site) Difficulty breathing, headache or visual disturbances Hives Persistent dizziness or light-headedness Extreme fatigue Any other questions or concerns you may have after discharge  In an emergency, call 911 or go to an Emergency Department at a nearby hospital  Follow- Up Appointment:  Please call for an appointment to be seen approximately 2-3 week after surgery in [facility] with your surgeon Dr. Charlies Constable - [phone] Address: [address] [place], [place], [place] [address]

## 2021-08-06 NOTE — Consult Note (Signed)
ORTHOPAEDIC CONSULTATION  REQUESTING PHYSICIAN: Regan Lemming, MD  Chief Complaint: Right periprosthetic distal femur fracture  HPI: [name] is a 88 y.o. female with a history of diastolic heart failure, atrial fibrillation, hypertension who who is nonambulatory at baseline.  Earlier today slid off the bedpan onto the floor.  She sustained an injury to her right knee.  She was seen in the emergency room where x-rays demonstrated a periprosthetic distal femur fracture.  She has had prior right knee replacement as well as bilateral hip replacements with Dr. Mayer Camel.  She did well after the surgeries.  She lives in a long-term care facility and is transferred by [facility] lift.  She also has a history of left heel ulcer pressure sore.  This had healed well with wound care.  She does not have current sores or ulcers, but does wear PRAFO boots at baseline for pressure sore prophylaxis.  Her brother and caretaker, [name] are with her at bedside to assist with providing history.  Past Medical History:  Diagnosis Date   Atrial fibrillation ([place])    CARCINOMA, BREAST    CHEST PAIN    DEGENERATIVE JOINT DISEASE    Diastolic heart failure ([place]) 08/03/2008   Qualifier: Diagnosis of  By: Burnett Kanaris     Edema    GIB (gastrointestinal bleeding) 05/24/2015   HYPERTENSION    Long term (current) use of anticoagulants    Malignant neoplasm of female breast ([place]) 08/03/2008   Qualifier: Diagnosis of  By: Burnett Kanaris     Unspecified diastolic heart failure    Past Surgical History:  Procedure Laterality Date   BREAST LUMPECTOMY     ESOPHAGOGASTRODUODENOSCOPY (EGD) WITH PROPOFOL Left 05/28/2015   Procedure: ESOPHAGOGASTRODUODENOSCOPY (EGD) WITH PROPOFOL;  Surgeon: Wilford Corner, MD;  Location: [facility] ENDOSCOPY;  Service: Endoscopy;  Laterality: Left;   HIP SURGERY     KNEE SURGERY     LAMINECTOMY     TONSILLECTOMY     Social History   Socioeconomic History   Marital status: Single    Spouse  name: Not on file   Number of children: Not on file   Years of education: Not on file   Highest education level: Not on file  Occupational History   Not on file  Tobacco Use   Smoking status: Never   Smokeless tobacco: Never  Vaping Use   Vaping Use: Never used  Substance and Sexual Activity   Alcohol use: No   Drug use: No   Sexual activity: Not on file  Other Topics Concern   Not on file  Social History Narrative   Not on file   Social Determinants of Health   Financial Resource Strain: Not on file  Food Insecurity: Not on file  Transportation Needs: Not on file  Physical Activity: Not on file  Stress: Not on file  Social Connections: Not on file   Family History  Problem Relation Age of Onset   CAD Mother    Heart attack Mother    Hypertension Mother    CAD Father    Stroke Brother    Heart attack Brother    Allergies  Allergen Reactions   Morphine And Related Other (See Comments)    Other Reaction: mild Intolerance, Allergy   Sulfa Antibiotics     Other reaction(s): Other (See Comments) Other Reaction: mild Intolerance, Allergy   Meperidine Nausea And Vomiting and Other (See Comments)   Meperidine Hcl Nausea Only   Nsaids     Other  reaction(s): Other (See Comments) Bleeding ulcer   Tolmetin Other (See Comments)    Other reaction(s): Other (See Comments) Bleeding ulcer     Positive ROS: All other systems have been reviewed and were otherwise negative with the exception of those mentioned in the HPI and as above.  Physical Exam: General: Alert, no acute distress Cardiovascular: No pedal edema Respiratory: No cyanosis, no use of accessory musculature Skin: No lesions in the area of chief complaint Neurologic: Sensation intact distally Psychiatric: Patient is competent for consent with normal mood and affect  MUSCULOSKELETAL:  RLE No traumatic wounds, ecchymosis, or rash  Mild tenderness about the right knee  No groin pain with log roll  No knee  or ankle effusion  Sens DPN, SPN, TN intact  Motor EHL, ext, flex 5/5  Foot is warm and well perfused, No significant edema  LLE No traumatic wounds, ecchymosis, or rash  Nontender  No groin pain with log roll  No knee or ankle effusion  Knee stable to varus/ valgus stress  Sens DPN, SPN, TN intact  Motor EHL, ext, flex 5/5  Foot is warm and well perfused, No significant edema   IMAGING: X-rays right femur were reviewed demonstrate a highly comminuted periprosthetic distal femur fracture with significant displacement of the femoral component  Assessment: Closed right comminuted distal femur fracture  Plan: Discussed the x-ray findings with the patient and her family at bedside.  She unfortunately has a displaced periprosthetic distal femur fracture.  Understand the patient's reluctance to have surgery given her age and comorbidities, but this is not fracture that can reliably be treated nonsurgically even in a nonambulatory person.  Discussed risks of nonoperative treatment including nonunion, risks skin breakdown either from the fracture or from the knee immobilizer, significant pain and difficulty with transfers including [facility] lift transfers and turns for hygiene care due to inadequate stabilization with a knee immobilizer.  If her ability to be transferred or turned becomes more challenging she is at increased risk for bedsores, pneumonia, blood clots.  Discussed regarding surgical treatment would need a CT scan to better evaluate if the fracture can be fixed with either a plate or nail or would need a knee revision.  CT scan is ordered and pending.  Discussed that if the fracture can be fixed the goal would be to act as an internal cast to provide pain control and increase her chances of fracture healing, and allow for easier transfers and general hygiene care.  If patient ultimately elects to proceed with surgery, we will touch base with the primary team regarding preoperative work-up and  optimization given her cardiac history and age.  If cleared, she can potentially have surgery on Friday.   Willaim Sheng, MD  Contact information:   [facility] 7am-5pm epic message Dr. Zachery Dakins, or call office for patient follow up: [phone] After hours and holidays please check [email or web address] for group call information for [facility]

## 2021-08-06 NOTE — ED Provider Notes (Signed)
[facility] DEPT Provider Note   CSN: [id number] Arrival date & time: 08/06/21  1208     History  Chief Complaint  Patient presents with   [name] is a 88 y.o. female with a past medical history of breast cancer, hypertension, A-fib not currently on anticoagulation presenting to the ED with a chief complaint of fall.  States that she was at her facility on a bedpan when she slowly slid off the bed and landed on her right side.  She has been having pain in her right hip and left thigh area since then.  She has chronic left shoulder pain that is unchanged.  Visitor at bedside states that she also has chronic bilateral lower extremity edema that she has taken diuretics for in the past.  Patient denies any chest pain, abdominal pain.  She is concerned about an abrasion on her face.  Denies any headache or neck pain.  She has had bilateral hip replacements in the past as well as a left-sided knee replacement.  She is bedbound at baseline. No LOC reported.   Fall Pertinent negatives include no chest pain, no abdominal pain and no shortness of breath.      Home Medications Prior to Admission medications   Medication Sig Start Date End Date Taking? Authorizing Provider  acetaminophen (TYLENOL) 325 MG tablet Take 2 tablets (650 mg total) by mouth every 6 (six) hours as needed for moderate pain. 09/19/20   Arrien, Jimmy Picket, MD  calcium-vitamin D (OSCAL WITH D) 500-200 MG-UNIT per tablet Take 1 tablet by mouth daily.    [provider]  Cholecalciferol (VITAMIN D) 125 MCG (5000 UT) CAPS Take 5,000 Units by mouth daily.    [provider]  diclofenac Sodium (VOLTAREN) 1 % GEL Apply 2 g topically 4 (four) times daily. Apply to left knee. 09/19/20   Arrien, Jimmy Picket, MD  docusate sodium (COLACE) 100 MG capsule Take 100 mg by mouth daily as needed for mild constipation.    [provider]  HYDROcodone-acetaminophen  (NORCO/VICODIN) 5-325 MG tablet Take 1 tablet by mouth every 6 (six) hours as needed for severe pain. 09/19/20   Arrien, Jimmy Picket, MD  Misc. Devices (POSTURE SEAT) MISC Lift chair 01/19/20   Silverio Decamp, MD  Multiple Vitamins-Minerals (CENTRUM PO) Take 1 tablet by mouth daily.    [provider]  Omega-3 Fatty Acids (FISH OIL PO) Take 1 tablet by mouth daily.    [provider]  omeprazole (PRILOSEC) 20 MG capsule Take 20 mg by mouth every morning. 08/26/20   [provider]      Allergies    Morphine and related, Sulfa antibiotics, Meperidine, Meperidine hcl, Nsaids, and Tolmetin    Review of Systems   Review of Systems  Constitutional:  Negative for appetite change, chills and fever.  HENT:  Negative for ear pain, rhinorrhea, sneezing and sore throat.   Eyes:  Negative for photophobia and visual disturbance.  Respiratory:  Negative for cough, chest tightness, shortness of breath and wheezing.   Cardiovascular:  Negative for chest pain and palpitations.  Gastrointestinal:  Negative for abdominal pain, blood in stool, constipation, diarrhea, nausea and vomiting.  Genitourinary:  Negative for dysuria, hematuria and urgency.  Musculoskeletal:  Positive for arthralgias and myalgias.  Skin:  Negative for rash.  Neurological:  Negative for dizziness, weakness and light-headedness.   Physical Exam Updated Vital Signs BP (!) 171/89 (BP Location: Left Arm)  Pulse 64   Temp (!) 97.4 F (36.3 C) (Axillary)   Resp 17   SpO2 100%  Physical Exam Vitals and nursing note reviewed.  Constitutional:      General: She is not in acute distress.    Appearance: She is well-developed.  HENT:     Head: Normocephalic and atraumatic.     Nose: Nose normal.  Eyes:     General: No scleral icterus.       Right eye: No discharge.        Left eye: No discharge.     Conjunctiva/sclera: Conjunctivae normal.     Pupils: Pupils are equal, round, and reactive to  light.  Cardiovascular:     Rate and Rhythm: Normal rate and regular rhythm.     Heart sounds: Normal heart sounds. No murmur heard.   No friction rub. No gallop.  Pulmonary:     Effort: Pulmonary effort is normal. No respiratory distress.     Breath sounds: Normal breath sounds.  Abdominal:     General: Bowel sounds are normal. There is no distension.     Palpations: Abdomen is soft.     Tenderness: There is no abdominal tenderness. There is no guarding.  Musculoskeletal:        General: Tenderness present. Normal range of motion.     Cervical back: Normal range of motion and neck supple.     Comments: Tenderness palpation of the right hip without deformities.  Able to move lower extremities although reports pain with movement.  Tenderness palpation of the left lateral lower thigh without deformities.  Bilateral lower extremity edema noted symmetrically.  Tenderness palpation of the left shoulder without deformities and without changes to range of motion.  No C, T or L-spine tenderness at the midline.  Skin:    General: Skin is warm and dry.     Findings: No rash.  Neurological:     Mental Status: She is alert.     Sensory: No sensory deficit.     Motor: No weakness or abnormal muscle tone.     Coordination: Coordination normal.    ED Results / Procedures / Treatments   Labs (all labs ordered are listed, but only abnormal results are displayed) Labs Reviewed - No data to display  EKG None  Radiology CT HEAD WO CONTRAST (5MM)  Result Date: 08/06/2021 CLINICAL DATA:  Head trauma, moderate to severe.  Fell from bed. EXAM: CT HEAD WITHOUT CONTRAST TECHNIQUE: Contiguous axial images were obtained from the base of the skull through the vertex without intravenous contrast. RADIATION DOSE REDUCTION: This exam was performed according to the departmental dose-optimization program which includes automated exposure control, adjustment of the mA and/or kV according to patient size and/or use  of iterative reconstruction technique. COMPARISON:  11/11/2018 FINDINGS: Brain: Generalized atrophy. Chronic small-vessel ischemic changes of the white matter, progressive since the prior examination. Mild increase in ex vacuo enlargement of the lateral ventricles. Old right occipital infarction, not present on the prior exam. No evidence of acute infarction, mass lesion, hemorrhage or extra-axial collection. Vascular: There is atherosclerotic calcification of the major vessels at the base of the brain. Skull: Negative Sinuses/Orbits: Clear/normal Other: None IMPRESSION: Generalized atrophy. Chronic small-vessel ischemic changes of the white matter. Slight increase in ventricular prominence since the prior exam, favored to represent central atrophy. Normal pressure hydrocephalus not completely excluded. Are there any clinical findings of that process? Old right occipital infarction, not present on the study of September 2020. Electronically  Signed   By: Nelson Chimes M.D.   On: 08/06/2021 14:10   DG Shoulder Left  Result Date: 08/06/2021 CLINICAL DATA:  [place] out of bed.  Left shoulder pain. EXAM: LEFT SHOULDER - 2+ VIEW COMPARISON:  Chest radiograph 03/26/2020 FINDINGS: Severe osteoarthritis with remodeling in the left glenohumeral joint. Evidence for heterotopic ossifications in the left shoulder. Left shoulder is located without acute fracture. No acute abnormality of the left AC joint. Evidence for old left rib fractures. IMPRESSION: 1. Severe degenerative changes in left shoulder. These findings are chronic. No acute bone abnormality. 2. Old left rib fractures. Electronically Signed   By: Markus Daft M.D.   On: 08/06/2021 14:05   DG Hip Unilat W or Wo Pelvis 2-3 Views Right  Result Date: 08/06/2021 CLINICAL DATA:  Fall.  Right hip pain EXAM: DG HIP (WITH OR WITHOUT PELVIS) 2-3V RIGHT COMPARISON:  04/05/2007, 05/02/2012 FINDINGS: Prior right total hip arthroplasty. Arthroplasty components are in their  expected alignment. No periprosthetic lucency or fracture is identified. The bones are diffusely demineralized. IMPRESSION: 1. No acute fracture or dislocation. 2. Prior right total hip arthroplasty. Electronically Signed   By: Davina Poke D.O.   On: 08/06/2021 13:59   DG Femur Min 2 Views Left  Result Date: 08/06/2021 CLINICAL DATA:  [place] out of bed.  Reports left femoral pain. EXAM: LEFT FEMUR 2 VIEWS COMPARISON:  Left knee 09/12/2020 FINDINGS: Left hip arthroplasty is located without a periprosthetic fracture. Severe joint space loss along the lateral knee compartment. Cannot exclude a small knee joint effusion. IMPRESSION: 1. No acute bone abnormality in left femur. Left hip is located without a periprosthetic fracture. 2. Severe osteoarthritis in the left knee particularly in the lateral compartment. Electronically Signed   By: Markus Daft M.D.   On: 08/06/2021 14:01    Procedures Procedures    Medications Ordered in ED Medications  HYDROcodone-acetaminophen (NORCO/VICODIN) 5-325 MG per tablet 1 tablet (1 tablet Oral Given 08/06/21 1444)    ED Course/ Medical Decision Making/ A&P                           Medical Decision Making Amount and/or Complexity of Data Reviewed Radiology: ordered.   88 year old female presenting to the ED after sliding off of her bed while on top of a bedpan just prior to arrival.  She is bedbound at baseline.  She landed on her right side and has been complaining of right hip pain and left lower thigh pain.  Also complains of left shoulder pain but apparently this is chronic for her.  Has some abrasions noted on the left side of her face.  She has no C, T or L-spine tenderness at the midline or paraspinal musculature.  She is able to move her extremities although reports pain with movement of the right hip.  No deformities noted on exam.  Visitor at bedside states that she is at her mental baseline.  Initial imaging of the left femur shows no acute bony  abnormality does show severe osteoarthritis of the left knee.  X-rays of the right hip without any abnormalities.  CT of the head shows no acute traumatic injury.  X-rays of the left shoulder without any acute abnormalities does show severe degenerative changes.  Upon further evaluation patient continues to complain of hip pain as well as left knee pain and lower leg pain.  We will also add on CT of the cervical spine and CT  of the pelvis due to this ongoing pain. Care and off to oncoming provider pending remainder of work-up.  If all work-up is reassuring without any evidence of fracture or other abnormality she can be discharged home with her continued pain regimen. Patient evaluated by my attending, Dr. Wyvonnia Dusky.   Portions of this note were generated with [employer]. Dictation errors may occur despite best attempts at proofreading.         Final Clinical Impression(s) / ED Diagnoses Final diagnoses:  Fall, initial encounter  Contusion of right hip, initial encounter  Primary osteoarthritis of left knee    Rx / DC Orders ED Discharge Orders     None         Delia Heady, PA-C 08/06/21 1451    Ezequiel Essex, MD 08/07/21 [phone]

## 2021-08-06 NOTE — Assessment & Plan Note (Signed)
Stable.  Patient not on anticoagulants according to family.  Awaiting med rec to verify of MAR.

## 2021-08-06 NOTE — Assessment & Plan Note (Addendum)
Admit to inpatient MedSurg bed.  Orthopedics to see the patient in the morning to discuss ORIF.  N.p.o. after midnight.  Norco 5 mg every 4 hours as needed moderate pain, Dilaudid 0.5 mg IV every 4 hours as needed for severe pain.  Periprosthetic fracture around internal prosthetic right knee joint is a Acute illness/condition that poses a threat to life or bodily function.

## 2021-08-06 NOTE — ED Notes (Signed)
Hospitalist at bedside 

## 2021-08-06 NOTE — Assessment & Plan Note (Signed)
Stable.  Awaiting home med rec to be completed.

## 2021-08-07 DIAGNOSIS — Z7401 Bed confinement status: Secondary | ICD-10-CM

## 2021-08-07 DIAGNOSIS — M9711XA Periprosthetic fracture around internal prosthetic right knee joint, initial encounter: Secondary | ICD-10-CM

## 2021-08-07 DIAGNOSIS — I739 Peripheral vascular disease, unspecified: Secondary | ICD-10-CM

## 2021-08-07 DIAGNOSIS — I5032 Chronic diastolic (congestive) heart failure: Secondary | ICD-10-CM | POA: Diagnosis not present

## 2021-08-07 DIAGNOSIS — I482 Chronic atrial fibrillation, unspecified: Secondary | ICD-10-CM

## 2021-08-07 DIAGNOSIS — S72401A Unspecified fracture of lower end of right femur, initial encounter for closed fracture: Secondary | ICD-10-CM

## 2021-08-07 LAB — COMPREHENSIVE METABOLIC PANEL
ALT: 18 U/L (ref 0–44)
AST: 19 U/L (ref 15–41)
Albumin: 2.7 g/dL — ABNORMAL LOW (ref 3.5–5.0)
Alkaline Phosphatase: 62 U/L (ref 38–126)
Anion gap: 7 (ref 5–15)
BUN: 44 mg/dL — ABNORMAL HIGH (ref 8–23)
CO2: 30 mmol/L (ref 22–32)
Calcium: 8.7 mg/dL — ABNORMAL LOW (ref 8.9–10.3)
Chloride: 106 mmol/L (ref 98–111)
Creatinine, Ser: 0.79 mg/dL (ref 0.44–1.00)
GFR, Estimated: >60 mL/min (ref >60)
Glucose, Bld: 122 mg/dL — ABNORMAL HIGH (ref 70–99)
Potassium: 4.8 mmol/L (ref 3.5–5.1)
Sodium: 143 mmol/L (ref 135–145)
Total Bilirubin: 1.1 mg/dL (ref 0.3–1.2)
Total Protein: 5.6 g/dL — ABNORMAL LOW (ref 6.5–8.1)

## 2021-08-07 LAB — CBC WITH DIFFERENTIAL/PLATELET
Abs Immature Granulocytes: 0.04 10*3/uL (ref 0.00–0.07)
Basophils Absolute: 0 10*3/uL (ref 0.0–0.1)
Basophils Relative: 0 %
Eosinophils Absolute: 0.1 10*3/uL (ref 0.0–0.5)
Eosinophils Relative: 1 %
HCT: 24.4 % — ABNORMAL LOW (ref 36.0–46.0)
Hemoglobin: 7.7 g/dL — ABNORMAL LOW (ref 12.0–15.0)
Immature Granulocytes: 0 %
Lymphocytes Relative: 14 %
Lymphs Abs: 1.3 10*3/uL (ref 0.7–4.0)
MCH: 29.2 pg (ref 26.0–34.0)
MCHC: 31.6 g/dL (ref 30.0–36.0)
MCV: 92.4 fL (ref 80.0–100.0)
Monocytes Absolute: 0.9 10*3/uL (ref 0.1–1.0)
Monocytes Relative: 9 %
Neutro Abs: 6.8 10*3/uL (ref 1.7–7.7)
Neutrophils Relative %: 76 %
Platelets: 189 10*3/uL (ref 150–400)
RBC: 2.64 MIL/uL — ABNORMAL LOW (ref 3.87–5.11)
RDW: 15.4 % (ref 11.5–15.5)
WBC: 9.1 10*3/uL (ref 4.0–10.5)
nRBC: 0 % (ref 0.0–0.2)

## 2021-08-07 LAB — PREPARE RBC (CROSSMATCH)

## 2021-08-07 LAB — SURGICAL PCR SCREEN
MRSA, PCR: NEGATIVE
Staphylococcus aureus: NEGATIVE

## 2021-08-07 LAB — MAGNESIUM: Magnesium: 2.2 mg/dL (ref 1.7–2.4)

## 2021-08-07 MED ORDER — POLYETHYLENE GLYCOL 3350 17 G PO PACK
17.0000 g | PACK | Freq: Every day | ORAL | Status: DC
  Administered 2021-08-07 – 2021-08-11 (×3): 17 g via ORAL
  Filled 2021-08-07 (×3): qty 1

## 2021-08-07 MED ORDER — MELATONIN 3 MG PO TABS
3.0000 mg | ORAL_TABLET | Freq: Every day | ORAL | Status: DC
  Administered 2021-08-07 – 2021-08-10 (×4): 3 mg via ORAL
  Filled 2021-08-07 (×4): qty 1

## 2021-08-07 MED ORDER — SODIUM CHLORIDE 0.9% IV SOLUTION: Freq: Once | INTRAVENOUS | Status: AC

## 2021-08-07 MED ORDER — FUROSEMIDE 10 MG/ML IJ SOLN
20.0000 mg | Freq: Once | INTRAMUSCULAR | Status: AC
  Administered 2021-08-07: 20 mg via INTRAVENOUS
  Filled 2021-08-07: qty 2

## 2021-08-07 MED ORDER — SENNOSIDES-DOCUSATE SODIUM 8.6-50 MG PO TABS
1.0000 | ORAL_TABLET | Freq: Two times a day (BID) | ORAL | Status: DC
  Administered 2021-08-07 – 2021-08-11 (×6): 1 via ORAL
  Filled 2021-08-07 (×6): qty 1

## 2021-08-07 NOTE — Progress Notes (Signed)
     Subjective: Patient seen this morning.  Caretaker Jan and nephew are at bedside.  Patient is drowsy this morning and not participating with discussion.  Per the patient's family, she has done okay with pain control overnight.  And has had significant spasm and this intermittent discomfort in the knee that has made pain control challenging.  Objective:   VITALS:   Vitals:   08/06/21 2321 08/07/21 0000 08/07/21 0407 08/07/21 0614  BP: (!) 125/50   (!) 117/51  Pulse: 91   91  Resp: 17   17  Temp: 97.6 F (36.4 C)   97.6 F (36.4 C)  TempSrc: Oral   Oral  SpO2: (!) 89%   94%  Weight:  95.1 kg 95.1 kg   Height:  '5\' 9"'$  (1.753 m)     Knee immobilizer in place, Moderate swelling about the left knee. Skin is intact without abrasions or wounds. Intact active ankle dorsiflexion plantarflexion. Foot is warm and well-perfused.  Lab Results  Component Value Date   WBC 9.1 08/07/2021   HGB 7.7 (L) 08/07/2021   HCT 24.4 (L) 08/07/2021   MCV 92.4 08/07/2021   PLT 189 08/07/2021   BMET    Component Value Date/Time   NA 143 08/07/2021 0346   NA 143 08/30/2017 1629   K 4.8 08/07/2021 0346   CL 106 08/07/2021 0346   CO2 30 08/07/2021 0346   GLUCOSE 122 (H) 08/07/2021 0346   BUN 44 (H) 08/07/2021 0346   BUN 32 08/30/2017 1629   CREATININE 0.79 08/07/2021 0346   CREATININE 1.02 (H) 12/30/2015 1138   CALCIUM 8.7 (L) 08/07/2021 0346   GFRNONAA >60 08/07/2021 0346     Xray: CT scan reviewed R knee. Comminuted distal femur fracture around femoral implant. Some bone still attached to the distal femur.  Assessment/Plan:     Principal Problem:   Periprosthetic fracture around internal prosthetic right knee joint Active Problems:   Essential hypertension   Atrial fibrillation (HCC)   Diastolic heart failure (HCC)  Right periprosthetic distal femur fracture  Reviewed the CT scan this morning.  Minimal bone remains attached to the prosthesis however should be sufficient for  ORIF with a distal femoral locking plate.  Ultimately given the fracture pattern distal femoral replacement would be a better option if the patient were more ambulatory and in better health. Ultimately if the pain remains well controlled can consider nonoperative treatment with the knee immobilizer.  Patient to remain nonweightbearing.  Discussed importance with the immobilizer of daily skin checks to make sure that she does not develop any pressure sores from the brace.  Discussed risk of nonunion, malunion, pain, brace related complications with knee immobilizer. Family would like the decision to ultimately be with the patient.  When she is more awake this morning they will discuss with her to decide how they would like to proceed with treatment.  If elects for surgery can do as soon as tomorrow afternoon.  We will touch base with the family later to see how they would like to proceed.    Cigi Bega A Mychele Seyller 08/07/2021, 7:31 AM   Charlies Constable, MD  Contact information:   [phone] 7am-5pm epic message Dr. Zachery Dakins, or call office for patient follow up: [phone] After hours and holidays please check [email or web address] for group call information for [facility]

## 2021-08-07 NOTE — Progress Notes (Signed)
PROGRESS NOTE    [name]  WJX:[id number] DOB: October 20, 1922 DOA: 08/06/2021 PCP: System, Provider Not In     Brief Narrative:  88 year old white female history of hypertension, atrial fibrillation, not on anticoagulation due to history of GI bleed, not on rate control agent due to history of bradycardia, currently bedbound status who lives at [facility] skilled nursing facility presents to the ER after a fall out of bed.  Family states that the patient needs a Hoyer lift to get out of bed and into a wheelchair.  Family states that patient needed to use the bathroom today.  She was rolled onto her side to be able to use a bedpan.  She rolled out of bed and onto the floor.  She had pain on the right thigh when she struck the floor.  She was brought to the ER.     X-rays showed a right femoral periprosthetic fracture around the prior right total knee arthroplasty.   Initially patient refused surgery.  She had been given p.o. Vicodin.   After orthopedics is discussed with the patient and family, the family and patient are now reconsidering operative intervention.   [facility] contacted for admission.    Subjective:  She is aaox3, reports in 10/10 pain if being moved , reports no pain at rest Family at bedside , reports patient was put on oxygen at SNF several months ago She currently does not c/o sob, denies chest pain, does no appear significantly volume overloaded   Assessment & Plan:  Principal Problem:   Periprosthetic fracture around internal prosthetic right knee joint Active Problems:   Essential hypertension   Atrial fibrillation (HCC)   Diastolic heart failure (HCC)   Closed fracture of right distal femur (HCC)    Assessment and Plan:  * Periprosthetic fracture around internal prosthetic right knee joint -Perioperative risk calculation 30 day mortality about 6% but RCRI and gupta criteria, not sure how well the criteria applies to a 36yr with bedbound  status at baseline. -Periprosthetic fracture around internal prosthetic right knee joint is an Acute illness/condition that poses a threat to life or bodily function.  -Orthopedics tand family is in the process discuss  ORIF.   -will follow ortho recommendation.  Acute normocytic anemia -Does not appear to have any external signs of bleeding, reported history of GI bleeding from ulcer, will check FOBT -Family and patient desire 1 units PRBC transfusion, replaced, repeat CBC in the morning  Diastolic heart failure (HCC) Chronic.  Family report patient was put on oxygen a few months ago  Family states patient on daily Lasix at the nursing home, message pharmacy to verify home meds Currently does not appear to be volume overloaded, will give one-time IV dose after PRBC transfusion Close monitor volume status  Atrial fibrillation (HCC) Stable.  Patient not on anticoagulants according to family.  Not on rate control agent due to history of bradycardia per review of cardiology notes  Essential hypertension Stable without any medication Monitor  PAD/ nonhealing wound on the left heel.   She was evaluated by vascular surgery , per vascular surgery "ABI was reviewed demonstrating severe atherosclerotic disease bilaterally.   The location on the lateral aspect of the heel is typical of pressure induced wounds from lying in bed for an extended period of time.  Patient is nonambulatory, a Hoyer lift at baseline, and is therefore not a revascularization candidate" Family report the wound has healed well [facility]  Body  mass index is 30.96 kg/m..   I have Reviewed nursing notes, Vitals, pain scores, I/o's, Lab results and  imaging results since pt's last encounter, details please see discussion above  I ordered the following labs:  Unresulted Labs (From admission, onward)     Start     Ordered   08/08/21 0500  CBC  Daily,   R     Question:  Specimen collection  method  Answer:  Lab=Lab collect   08/07/21 1654   08/08/21 2297  Basic metabolic panel  Tomorrow morning,   R       Question:  Specimen collection method  Answer:  Lab=Lab collect   08/07/21 1655   08/07/21 1654  Type and screen [place]  Once,   R       Comments: [place]    08/07/21 1654   08/07/21 1654  Prepare RBC (crossmatch)  (Adult Blood Administration - Red Blood Cells)  Once,   R       Question Answer Comment  # of Units 1 unit   Transfusion Indications Symptomatic Anemia   Number of Units to Keep Ahead NO units ahead   If emergent release call blood bank [facility] [phone]      08/07/21 1654   Unscheduled  Occult blood card to lab, stool RN will collect  As needed,   R     Question:  Specimen to be collected by:  Answer:  RN will collect   08/07/21 1648             DVT prophylaxis: heparin injection 5,000 Units Start: 08/07/21 0600 SCDs Start: 08/06/21 2351   Code Status:   Code Status: Full Code  Family Communication: Family at bedside and over the phone Disposition:    Dispo: The patient is from: SNF /long-term care              Anticipated d/c is to: SNF /long-term care              Anticipated d/c date is: TBD, need orthopedic clearance  Antimicrobials:    Anti-infectives (From admission, onward)    None          Objective: Vitals:   08/07/21 0000 08/07/21 0407 08/07/21 0614 08/07/21 0916  BP:   (!) 117/51 (!) 128/57  Pulse:   91 81  Resp:   17 18  Temp:   97.6 F (36.4 C) (!) 97.5 F (36.4 C)  TempSrc:   Oral Oral  SpO2:   94% 97%  Weight: 95.1 kg 95.1 kg    Height: '5\' 9"'$  (1.753 m)       Intake/Output Summary (Last 24 hours) at 08/07/2021 1717 Last data filed at 08/07/2021 1000 Gross per 24 hour  Intake 50 ml  Output 250 ml  Net -200 ml   Filed Weights   08/07/21 0000 08/07/21 0407  Weight: 95.1 kg 95.1 kg    Examination:  General exam: alert, awake, communicative,calm,  NAD Respiratory system: Clear to auscultation. Respiratory effort normal. Cardiovascular system:  IRRR.  Gastrointestinal system: Abdomen is nondistended, soft and nontender.  Normal bowel sounds heard. Central nervous system: Alert and oriented. No focal neurological deficits. Extremities: Right knee in immobilizer Skin: A few scattered ecchymosis Psychiatry: Judgement and insight appear normal. Mood & affect appropriate.     Data Reviewed: I have personally reviewed  labs and visualized  imaging studies since the last encounter and formulate the plan  Scheduled Meds:  sodium chloride   Intravenous Once   furosemide  20 mg Intravenous Once   heparin  5,000 Units Subcutaneous Q8H   melatonin  3 mg Oral QHS   polyethylene glycol  17 g Oral Daily   senna-docusate  1 tablet Oral BID   Continuous Infusions:   LOS: 1 day      Florencia Reasons, MD PhD FACP [facility]  Available via Epic secure chat 7am-7pm for nonurgent issues Please page for urgent issues To page the attending provider between 7A-7P or the covering provider during after hours 7P-7A, please log into the web site [email or web address] and access using universal Nitro password for that web site. If you do not have the password, please call the hospital operator.    08/07/2021, 5:17 PM

## 2021-08-07 NOTE — Plan of Care (Signed)

## 2021-08-07 NOTE — Progress Notes (Deleted)
17:03 Attempted to give report, no answer.

## 2021-08-07 NOTE — Progress Notes (Signed)
Chaplain engaged in an initial visit with [name] and her nephew this morning.  Chaplain provided education on how to complete an Scientist, physiological, Healthcare POA.    Chaplain was able later to complete the notarization of Healthcare POA documents.  Chaplain provided 5 copies to the family and input one in her medical chart.  The family was also given the original copy.   Chaplain is available to follow-up as needed.   08/07/21 1400  Clinical Encounter Type  Visited With Patient and family together;Patient  Visit Type Initial;Follow-up;Social support;Spiritual support  Spiritual Encounters  Spiritual Needs Prayer;Brochure;Literature

## 2021-08-08 ENCOUNTER — Inpatient Hospital Stay (HOSPITAL_COMMUNITY): Payer: Medicare PPO

## 2021-08-08 ENCOUNTER — Encounter (HOSPITAL_COMMUNITY): Payer: Self-pay | Admitting: Internal Medicine

## 2021-08-08 ENCOUNTER — Encounter (HOSPITAL_COMMUNITY)
Admission: EM | Disposition: A | Discharge status: Skilled Nursing Facility | Payer: Self-pay | Source: Skilled Nursing Facility | Attending: Internal Medicine

## 2021-08-08 ENCOUNTER — Other Ambulatory Visit: Payer: Self-pay

## 2021-08-08 ENCOUNTER — Inpatient Hospital Stay (HOSPITAL_COMMUNITY): Payer: Medicare PPO | Admitting: Certified Registered Nurse Anesthetist

## 2021-08-08 DIAGNOSIS — I5032 Chronic diastolic (congestive) heart failure: Secondary | ICD-10-CM | POA: Diagnosis not present

## 2021-08-08 DIAGNOSIS — I4891 Unspecified atrial fibrillation: Secondary | ICD-10-CM

## 2021-08-08 DIAGNOSIS — I10 Essential (primary) hypertension: Secondary | ICD-10-CM

## 2021-08-08 DIAGNOSIS — J449 Chronic obstructive pulmonary disease, unspecified: Secondary | ICD-10-CM

## 2021-08-08 DIAGNOSIS — Z7401 Bed confinement status: Secondary | ICD-10-CM | POA: Diagnosis not present

## 2021-08-08 DIAGNOSIS — I482 Chronic atrial fibrillation, unspecified: Secondary | ICD-10-CM | POA: Diagnosis not present

## 2021-08-08 DIAGNOSIS — M9711XA Periprosthetic fracture around internal prosthetic right knee joint, initial encounter: Secondary | ICD-10-CM

## 2021-08-08 HISTORY — PX: ORIF FEMUR FRACTURE: SHX2119

## 2021-08-08 LAB — BASIC METABOLIC PANEL
Anion gap: 7 (ref 5–15)
BUN: 56 mg/dL — ABNORMAL HIGH (ref 8–23)
CO2: 30 mmol/L (ref 22–32)
Calcium: 8.2 mg/dL — ABNORMAL LOW (ref 8.9–10.3)
Chloride: 103 mmol/L (ref 98–111)
Creatinine, Ser: 0.78 mg/dL (ref 0.44–1.00)
GFR, Estimated: >60 mL/min (ref >60)
Glucose, Bld: 112 mg/dL — ABNORMAL HIGH (ref 70–99)
Potassium: 4.3 mmol/L (ref 3.5–5.1)
Sodium: 140 mmol/L (ref 135–145)

## 2021-08-08 LAB — CBC
HCT: 25.5 % — ABNORMAL LOW (ref 36.0–46.0)
Hemoglobin: 8.3 g/dL — ABNORMAL LOW (ref 12.0–15.0)
MCH: 29.5 pg (ref 26.0–34.0)
MCHC: 32.5 g/dL (ref 30.0–36.0)
MCV: 90.7 fL (ref 80.0–100.0)
Platelets: 178 10*3/uL (ref 150–400)
RBC: 2.81 MIL/uL — ABNORMAL LOW (ref 3.87–5.11)
RDW: 15.3 % (ref 11.5–15.5)
WBC: 12 10*3/uL — ABNORMAL HIGH (ref 4.0–10.5)
nRBC: 0 % (ref 0.0–0.2)

## 2021-08-08 LAB — PREPARE RBC (CROSSMATCH)

## 2021-08-08 SURGERY — OPEN REDUCTION INTERNAL FIXATION (ORIF) DISTAL FEMUR FRACTURE
Anesthesia: Spinal | Laterality: Right

## 2021-08-08 MED ORDER — OXYCODONE HCL 5 MG PO TABS
5.0000 mg | ORAL_TABLET | ORAL | Status: DC | PRN
  Administered 2021-08-09: 5 mg via ORAL
  Filled 2021-08-08: qty 1

## 2021-08-08 MED ORDER — ACETAMINOPHEN 10 MG/ML IV SOLN: 1000.0000 mg | Freq: Once | INTRAVENOUS | Status: DC | PRN

## 2021-08-08 MED ORDER — ORAL CARE MOUTH RINSE: 15.0000 mL | Freq: Once | OROMUCOSAL | Status: AC

## 2021-08-08 MED ORDER — CHLORHEXIDINE GLUCONATE 4 % EX LIQD: 60.0000 mL | Freq: Once | CUTANEOUS | Status: DC

## 2021-08-08 MED ORDER — VANCOMYCIN HCL 1000 MG IV SOLR
INTRAVENOUS | Status: DC | PRN
  Administered 2021-08-08: 1000 mg via TOPICAL

## 2021-08-08 MED ORDER — PHENYLEPHRINE HCL (PRESSORS) 10 MG/ML IV SOLN
INTRAVENOUS | Status: AC
  Filled 2021-08-08: qty 1

## 2021-08-08 MED ORDER — SODIUM CHLORIDE 0.9 % IV SOLN: INTRAVENOUS | Status: DC | PRN

## 2021-08-08 MED ORDER — ONDANSETRON HCL 4 MG/2ML IJ SOLN
INTRAMUSCULAR | Status: AC
  Filled 2021-08-08: qty 2

## 2021-08-08 MED ORDER — POVIDONE-IODINE 10 % EX SWAB
2.0000 "application " | Freq: Once | CUTANEOUS | Status: AC
  Administered 2021-08-08: 2 via TOPICAL

## 2021-08-08 MED ORDER — CEFAZOLIN SODIUM-DEXTROSE 2-4 GM/100ML-% IV SOLN
2.0000 g | INTRAVENOUS | Status: AC
  Administered 2021-08-08: 2 g via INTRAVENOUS
  Filled 2021-08-08: qty 100

## 2021-08-08 MED ORDER — PROPOFOL 500 MG/50ML IV EMUL: INTRAVENOUS | Status: DC | PRN

## 2021-08-08 MED ORDER — VANCOMYCIN HCL 1000 MG IV SOLR
INTRAVENOUS | Status: AC
  Filled 2021-08-08: qty 20

## 2021-08-08 MED ORDER — ALBUMIN HUMAN 5 % IV SOLN: INTRAVENOUS | Status: DC | PRN

## 2021-08-08 MED ORDER — LACTATED RINGERS IV SOLN: INTRAVENOUS | Status: DC

## 2021-08-08 MED ORDER — ENOXAPARIN SODIUM 40 MG/0.4ML IJ SOSY
40.0000 mg | PREFILLED_SYRINGE | INTRAMUSCULAR | Status: DC
  Administered 2021-08-09 – 2021-08-11 (×3): 40 mg via SUBCUTANEOUS
  Filled 2021-08-08 (×3): qty 0.4

## 2021-08-08 MED ORDER — KETAMINE HCL 50 MG/5ML IJ SOSY
PREFILLED_SYRINGE | INTRAMUSCULAR | Status: AC
Start: 2021-08-08 — End: ?
  Filled 2021-08-08: qty 5

## 2021-08-08 MED ORDER — PROPOFOL 1000 MG/100ML IV EMUL
INTRAVENOUS | Status: AC
  Filled 2021-08-08: qty 100

## 2021-08-08 MED ORDER — PHENYLEPHRINE HCL-NACL 20-0.9 MG/250ML-% IV SOLN
INTRAVENOUS | Status: DC | PRN
  Administered 2021-08-08: 25 ug/min via INTRAVENOUS
  Administered 2021-08-08 (×2): 80 ug via INTRAVENOUS

## 2021-08-08 MED ORDER — FENTANYL CITRATE (PF) 100 MCG/2ML IJ SOLN
INTRAMUSCULAR | Status: AC
  Filled 2021-08-08: qty 2

## 2021-08-08 MED ORDER — KETAMINE HCL 10 MG/ML IJ SOLN
INTRAMUSCULAR | Status: DC | PRN
  Administered 2021-08-08 (×3): 5 mg via INTRAVENOUS

## 2021-08-08 MED ORDER — FENTANYL CITRATE (PF) 250 MCG/5ML IJ SOLN
INTRAMUSCULAR | Status: DC | PRN
  Administered 2021-08-08 (×4): 25 ug via INTRAVENOUS

## 2021-08-08 MED ORDER — ONDANSETRON HCL 4 MG/2ML IJ SOLN
INTRAMUSCULAR | Status: DC | PRN
  Administered 2021-08-08: 4 mg via INTRAVENOUS

## 2021-08-08 MED ORDER — CHLORHEXIDINE GLUCONATE 0.12 % MT SOLN
15.0000 mL | Freq: Once | OROMUCOSAL | Status: AC
  Administered 2021-08-08: 15 mL via OROMUCOSAL

## 2021-08-08 MED ORDER — BUPIVACAINE HCL (PF) 0.25 % IJ SOLN
INTRAMUSCULAR | Status: DC | PRN
  Administered 2021-08-08: 50 mL

## 2021-08-08 MED ORDER — PROPOFOL 10 MG/ML IV BOLUS
INTRAVENOUS | Status: AC
  Filled 2021-08-08: qty 20

## 2021-08-08 MED ORDER — FENTANYL CITRATE PF 50 MCG/ML IJ SOSY
PREFILLED_SYRINGE | INTRAMUSCULAR | Status: AC
  Filled 2021-08-08: qty 2

## 2021-08-08 MED ORDER — CEFAZOLIN SODIUM-DEXTROSE 2-4 GM/100ML-% IV SOLN
2.0000 g | Freq: Three times a day (TID) | INTRAVENOUS | Status: AC
  Administered 2021-08-08 – 2021-08-09 (×2): 2 g via INTRAVENOUS
  Filled 2021-08-08 (×2): qty 100

## 2021-08-08 MED ORDER — ALBUMIN HUMAN 5 % IV SOLN
INTRAVENOUS | Status: AC
  Filled 2021-08-08: qty 500

## 2021-08-08 MED ORDER — BUPIVACAINE HCL 0.25 % IJ SOLN
INTRAMUSCULAR | Status: AC
Start: 2021-08-08 — End: ?
  Filled 2021-08-08: qty 1

## 2021-08-08 MED ORDER — ONDANSETRON HCL 4 MG/2ML IJ SOLN: 4.0000 mg | Freq: Once | INTRAMUSCULAR | Status: DC | PRN

## 2021-08-08 MED ORDER — SODIUM CHLORIDE 0.9% IV SOLUTION: Freq: Once | INTRAVENOUS | Status: AC

## 2021-08-08 MED ORDER — PROPOFOL 500 MG/50ML IV EMUL
INTRAVENOUS | Status: DC | PRN
  Administered 2021-08-08 (×2): 50 mg via INTRAVENOUS
  Administered 2021-08-08: 20 mg via INTRAVENOUS

## 2021-08-08 MED ORDER — EPHEDRINE SULFATE (PRESSORS) 50 MG/ML IJ SOLN
INTRAMUSCULAR | Status: DC | PRN
  Administered 2021-08-08 (×2): 5 mg via INTRAVENOUS

## 2021-08-08 MED ORDER — FENTANYL CITRATE PF 50 MCG/ML IJ SOSY: 25.0000 ug | PREFILLED_SYRINGE | INTRAMUSCULAR | Status: DC | PRN

## 2021-08-08 MED ORDER — 0.9 % SODIUM CHLORIDE (POUR BTL) OPTIME
TOPICAL | Status: DC | PRN
  Administered 2021-08-08: 1000 mL

## 2021-08-08 MED ORDER — GLYCOPYRROLATE 0.2 MG/ML IJ SOLN
INTRAMUSCULAR | Status: DC | PRN
  Administered 2021-08-08: .2 mg via INTRAVENOUS

## 2021-08-08 MED ORDER — TRANEXAMIC ACID-NACL 1000-0.7 MG/100ML-% IV SOLN
1000.0000 mg | INTRAVENOUS | Status: AC
  Administered 2021-08-08: 1000 mg via INTRAVENOUS
  Filled 2021-08-08: qty 100

## 2021-08-08 SURGICAL SUPPLY — 73 items
APL PRP STRL LF DISP 70% ISPRP (MISCELLANEOUS) ×1
BAG COUNTER SPONGE SURGICOUNT (BAG) IMPLANT
BAG SPEC THK2 15X12 ZIP CLS (MISCELLANEOUS) ×1
BAG SPNG CNTER NS LX DISP (BAG)
BAG ZIPLOCK 12X15 (MISCELLANEOUS) ×2 IMPLANT
BIT DRILL 4.3 (BIT) ×2
BIT DRILL 4.3X300MM (BIT) IMPLANT
BIT DRILL LONG 3.3 (BIT) ×1 IMPLANT
BIT DRILL QC 3.3X195 (BIT) ×1 IMPLANT
BNDG ELASTIC 6X5.8 VLCR STR LF (GAUZE/BANDAGES/DRESSINGS) ×2 IMPLANT
BNDG GAUZE ELAST 4 BULKY (GAUZE/BANDAGES/DRESSINGS) ×2 IMPLANT
CAP LOCK NCB (Cap) ×8 IMPLANT
CHLORAPREP W/TINT 26 (MISCELLANEOUS) ×2 IMPLANT
COTTON STERILE ROLL (GAUZE/BANDAGES/DRESSINGS) ×1 IMPLANT
COVER BACK TABLE 60X90IN (DRAPES) ×1 IMPLANT
COVER SURGICAL LIGHT HANDLE (MISCELLANEOUS) ×2 IMPLANT
DRAPE C-ARM 42X120 X-RAY (DRAPES) ×2 IMPLANT
DRAPE C-ARMOR (DRAPES) ×2 IMPLANT
DRAPE ORTHO SPLIT 77X108 STRL (DRAPES) ×4
DRAPE POUCH INSTRU U-SHP 10X18 (DRAPES) ×3 IMPLANT
DRAPE SHEET LG 3/4 BI-LAMINATE (DRAPES) ×7 IMPLANT
DRAPE SURG ORHT 6 SPLT 77X108 (DRAPES) ×2 IMPLANT
DRSG ADAPTIC 3X8 NADH LF (GAUZE/BANDAGES/DRESSINGS) ×1 IMPLANT
DRSG EMULSION OIL 3X16 NADH (GAUZE/BANDAGES/DRESSINGS) ×2 IMPLANT
DRSG PAD ABDOMINAL 8X10 ST (GAUZE/BANDAGES/DRESSINGS) ×2 IMPLANT
ELECT REM PT RETURN 15FT ADLT (MISCELLANEOUS) ×2 IMPLANT
GAUZE SPONGE 4X4 12PLY STRL (GAUZE/BANDAGES/DRESSINGS) ×2 IMPLANT
GLOVE BIO SURGEON STRL SZ8.5 (GLOVE) ×4 IMPLANT
GLOVE BIOGEL M 7.0 STRL (GLOVE) ×2 IMPLANT
GLOVE BIOGEL PI IND STRL 7.5 (GLOVE) ×1 IMPLANT
GLOVE BIOGEL PI IND STRL 8 (GLOVE) ×1 IMPLANT
GLOVE BIOGEL PI IND STRL 8.5 (GLOVE) ×1 IMPLANT
GLOVE BIOGEL PI INDICATOR 7.5 (GLOVE) ×1
GLOVE BIOGEL PI INDICATOR 8 (GLOVE) ×1
GLOVE BIOGEL PI INDICATOR 8.5 (GLOVE) ×1
GLOVE SURG LX 7.5 STRW (GLOVE) ×2
GLOVE SURG LX STRL 7.5 STRW (GLOVE) ×2 IMPLANT
GOWN SPEC L3 XXLG W/TWL (GOWN DISPOSABLE) ×4 IMPLANT
JET LAVAGE IRRISEPT WOUND (IRRIGATION / IRRIGATOR)
K-WIRE 2.0 (WIRE) ×4
K-WIRE FXSTD 280X2XNS SS (WIRE) ×2
KIT BASIN OR (CUSTOM PROCEDURE TRAY) ×2 IMPLANT
KIT TURNOVER KIT A (KITS) ×1 IMPLANT
KWIRE FXSTD 280X2XNS SS (WIRE) IMPLANT
LAVAGE JET IRRISEPT WOUND (IRRIGATION / IRRIGATOR) IMPLANT
MANIFOLD NEPTUNE II (INSTRUMENTS) ×2 IMPLANT
NS IRRIG 1000ML POUR BTL (IV SOLUTION) ×2 IMPLANT
PACK TOTAL JOINT (CUSTOM PROCEDURE TRAY) ×2 IMPLANT
PADDING CAST COTTON 6X4 STRL (CAST SUPPLIES) ×1 IMPLANT
PLATE LOCK FEM 355 18H RT (Plate) ×1 IMPLANT
PROTECTOR NERVE ULNAR (MISCELLANEOUS) ×2 IMPLANT
SCREW 5.0 70MM (Screw) ×1 IMPLANT
SCREW 5.0 80MM (Screw) ×2 IMPLANT
SCREW NCB 3.5X75X5X6.2XST (Screw) IMPLANT
SCREW NCB 4.0MX38M (Screw) ×1 IMPLANT
SCREW NCB 4.0X40MM (Screw) ×1 IMPLANT
SCREW NCB 5.0X36MM (Screw) ×1 IMPLANT
SCREW NCB 5.0X38 (Screw) ×2 IMPLANT
SCREW NCB 5.0X75MM (Screw) ×2 IMPLANT
SCREW NCB 5.0X85MM (Screw) ×2 IMPLANT
STAPLER VISISTAT 35W (STAPLE) ×2 IMPLANT
STOCKINETTE 8 INCH (MISCELLANEOUS) ×1 IMPLANT
STRIP CLOSURE SKIN 1/2X4 (GAUZE/BANDAGES/DRESSINGS) ×2 IMPLANT
SUT MNCRL AB 3-0 PS2 18 (SUTURE) ×2 IMPLANT
SUT MNCRL AB 4-0 PS2 18 (SUTURE) ×2 IMPLANT
SUT STRATAFIX PDO 1 14 VIOLET (SUTURE) ×2
SUT STRATFX PDO 1 14 VIOLET (SUTURE) ×1
SUT VIC AB 1 CT1 36 (SUTURE) ×4 IMPLANT
SUT VIC AB 2-0 CT1 27 (SUTURE) ×4
SUT VIC AB 2-0 CT1 TAPERPNT 27 (SUTURE) ×2 IMPLANT
SUTURE STRATFX PDO 1 14 VIOLET (SUTURE) ×1 IMPLANT
TOWEL OR 17X26 10 PK STRL BLUE (TOWEL DISPOSABLE) ×4 IMPLANT
WATER STERILE IRR 1000ML POUR (IV SOLUTION) ×2 IMPLANT

## 2021-08-08 NOTE — Op Note (Signed)
08/08/2021  4:09 PM  PATIENT:  [name]    PRE-OPERATIVE DIAGNOSIS:  right distal femur periprosthetic fracture  POST-OPERATIVE DIAGNOSIS:  Same  PROCEDURE:  OPEN REDUCTION INTERNAL FIXATION (ORIF) DISTAL FEMUR FRACTURE  SURGEON:  Asheton Viramontes A Wynton Hufstetler, MD  PHYSICIAN ASSISTANT: Izola Price, RNFA, present and scrubbed throughout the case, critical for completion in a timely fashion, and for retraction, instrumentation, and closure.  ANESTHESIA:   General  PREOPERATIVE INDICATIONS:  [name] is a  88 y.o. female with a diagnosis of right distal femur periprosthetic fracture who failed conservative measures and elected for surgical management.    The risks benefits and alternatives were discussed with the patient preoperatively including but not limited to the risks of infection, bleeding, nerve injury, cardiopulmonary complications, the need for revision surgery, among others, and the patient was willing to proceed.  ESTIMATED BLOOD LOSS: 250cc  OPERATIVE IMPLANTS:  [employer] 15 hole NCB plate, 6 5.0 screws with locking caps on each distally, 3 5.0 screws proximally 2 with locking caps, most proximal screw is a 4.0 screw without locking Implant Name Type Inv. Item Serial No. Manufacturer Lot No. LRB No. Used Action  CAP LOCK NCB - [id number] Cap CAP LOCK NCB  [employer](ORTH,TRAU,BIO,SG)  Right 8 Implanted  15 hole distal right plate      Right 1 Implanted  SCREW 5.0 80MM - [id number] Screw SCREW 5.0 80MM  [employer](ORTH,TRAU,BIO,SG)  Right 2 Implanted  SCREW 5.0 70MM - [id number] Screw SCREW 5.0 70MM  [employer](ORTH,TRAU,BIO,SG)  Right 1 Implanted  SCREW NCB 5.0X75MM - [id number] Screw SCREW NCB 5.0X75MM  [employer](ORTH,TRAU,BIO,SG)  Right 1 Implanted  SCREW NCB 5.0X38 - [id number] Screw SCREW NCB 5.0X38  [employer](ORTH,TRAU,BIO,SG)  Right 2 Implanted  SCREW NCB 5.0X36MM - [id number] Screw SCREW NCB 5.0X36MM  [employer](ORTH,TRAU,BIO,SG)  Right 1 Implanted   SCREW NCB 5.0X85MM - [id number] Screw SCREW NCB 5.0X85MM  [employer](ORTH,TRAU,BIO,SG)  Right 2 Implanted  SCREW NCB 4.0X40MM - [id number] Screw SCREW NCB 4.0X40MM  [employer](ORTH,TRAU,BIO,SG)  Right 1 Implanted     OPERATIVE FINDINGS: Successful open external fixation of the distal femur fracture.  Plate extends beyond the total hip prosthesis proximally.  Good purchase in bone proximally and distally.  OPERATIVE PROCEDURE:  The patient was identified in the preoperative holding area. Consent was confirmed with the patient and their family and all questions were answered. The operative extremity was marked after confirmation with the patient. The patient was then brought back to the operating room by our anesthesia colleagues. The patient was placed under general anesthesia and was carefully transferred over to a radiolucent flattop table. They were secured to the bed and all bony prominences were padded.  A bump was placed under the operative hip. The operative extremity was then prepped and draped in usual sterile fashion. A timeout was performed to verify the patient, the procedure and extremity. Preoperative antibiotics were dosed.   Fluoroscopy was used to identify the fracture. A lateral approach was made to the distal femur. It was taken down through the skin and the IT band was incised in line with the incision. The distal portion of the vastus lateralis was reflected off the IT band and the distal articular block of the lateral femoral condyle was exposed.  The femoral arthroplasty prosthesis component was felt to be intact with distal femoral bone.An attempt was made to keep the fracture from being stripped or devitalized. I used fluoroscopy to identify the correct length plate to  bridge the whole femoral shaft and proximal to the tip of the hip prothesis. I chose a 15-hole plate because it extended proximally above the total hip replacement to avoid a stress riser.. The aiming arm was  attached to the plate and slide submuscularly under the vastus to the proximal femur. The distal portion of the plate was placed flush against the lateral cortex and 2 K wires were used to position the plate at the appropriate position distally and anteriorly on the distal femur, with care making sure that we would be able to pass screws and not hit the femur or knee arthroplasty prosthesis distally. A 3.77m drill bit was used to position the proximal portion of the plate.  The fracture was in significant valgus so a bone hook was used around the shaft above the fracture to help reduce the plate to the fracture proximally.  A perfect lateral was obtained to show appropriate positioning of the plate.   A small percutaneous incision was made medially and a periarticular clamp was placed around the distal femoral block to hold the epicondyles reduced and try to take the distal segment out of valgus. The distal segment was drilled and 5.077mcortical screws were placed in the distal fracture segment. A total of 3 screws were placed initially.  Just distal to our drill bit a 5.0 screw was placed to hold the plate down to the femur.  Reduction at this point was satisfactory.  Screw was placed percutaneously in the shaft to reduce the coronal alignment. A total of four shaft screws were placed to allow for a significant working length and micromotion of the fracture. Locking caps were placed in the shaft screws except for the most proximal two most screw to prevent a stress riser. The targeting guide was removed. Three more screws were placed in the distal segment.  Locking caps were placed on all the distal screws.  Fluoroscopy was used to confirm adequate reduction of the fracture and appropriate position of the plate and screws.   The incisions were irrigated with normal saline. A gram of vancomycin powder was placed in the incision around the plate. The IT band was closed with 0-vicryl.  Fat layer was closed with  interrupted 0 Vicryl.  The skin was closed with 2-vicryl, 3-0 nylon. The incisions were dressed with Adaptic, 4 x 4 gauze, ABD, Webril, cotton padding, and an ACE wrap. The patient was then transferred to the regular floor bed and taken to the PACU in stable condition.   Post op recs: WB: NWB RLE Abx: ancef x23 hours post op Imaging: PACU xrays Dressing: keep intact until follow up, change PRN if soiled or saturated. DVT prophylaxis: DVT prophylaxis x4 weeks Lovenox or equivalent  Follow up: 2 weeks after surgery for a wound check [facility] Dr[facility] Address: [address] [id number] Phone: [phone]

## 2021-08-08 NOTE — Plan of Care (Signed)
  Problem: Nutrition: Goal: Adequate nutrition will be maintained Outcome: Progressing   Problem: Pain Managment: Goal: General experience of comfort will improve Outcome: Progressing   

## 2021-08-08 NOTE — Interval H&P Note (Signed)
The patient has been re-examined, and the chart reviewed, and there have been no interval changes to the documented history and physical.    Plan for ORIF R distal femur fracture. Goal of surgery is stabilization for transfer and pain control. Patient nonambulatory at baseline. Confirmed XR read of the R femur is incorrect as it reads left distal femur fracture around knee replacement but patient has a right TKA but no L TKA.  The operative side was examined and the patient was confirmed to have. Sens DPN, SPN, TN intact, Motor EHL, ext, flex 5/5, and DP 2+, PT 2+, No significant edema.   The risks, benefits, and alternatives have been discussed at length with patient and her family, and the patient is willing to proceed.  Right knee marked. Consent has been signed.

## 2021-08-08 NOTE — Anesthesia Postprocedure Evaluation (Signed)
Anesthesia Post Note  Patient: [name]  Procedure(s) Performed: OPEN REDUCTION INTERNAL FIXATION (ORIF) DISTAL FEMUR FRACTURE (Right)     Patient location during evaluation: PACU Anesthesia Type: General Level of consciousness: awake and alert Pain management: pain level controlled Vital Signs Assessment: post-procedure vital signs reviewed and stable Respiratory status: spontaneous breathing, nonlabored ventilation, respiratory function stable and patient connected to nasal cannula oxygen Cardiovascular status: blood pressure returned to baseline and stable Postop Assessment: no apparent nausea or vomiting Anesthetic complications: no   No notable events documented.  Last Vitals:  Vitals:   08/08/21 1715 08/08/21 1743  BP: 133/73 126/75  Pulse: 66 64  Resp: 17 16  Temp:    SpO2: 99% 93%    Last Pain:  Vitals:   08/08/21 1750  TempSrc:   PainSc: Asleep                 Belenda Cruise P Cleaster Shiffer

## 2021-08-08 NOTE — Anesthesia Procedure Notes (Signed)
Spinal  Patient location during procedure: OR Reason for block: surgical anesthesia Staffing Performed: anesthesiologist  Anesthesiologist: Barnet Glasgow, MD Performed by: Barnet Glasgow, MD Authorized by: Barnet Glasgow, MD   Preanesthetic Checklist Completed: patient identified, IV checked, risks and benefits discussed, surgical consent, monitors and equipment checked, pre-op evaluation and timeout performed Spinal Block Patient position: sitting Prep: DuraPrep and site prepped and draped Patient monitoring: heart rate, cardiac monitor, continuous pulse ox and blood pressure Approach: midline Location: L3-4 Injection technique: single-shot Needle Needle type: Pencan  Needle gauge: 24 G Needle length: 10 cm Assessment Sensory level: T4 Events: CSF return Additional Notes 2 Attempt (s).  Unsuccessful further attempts abandoned

## 2021-08-08 NOTE — Progress Notes (Signed)
Report given to short stay Nurse. Pt alert and oriented at the time of transfer.

## 2021-08-08 NOTE — H&P (View-Only) (Signed)
Subjective: Patient reports pain as well controlled when not moving.  Had extensive discussion with the patient and her family yesterday and again this morning.  Per this discussion they have concerns that given the immobilizer and poor pain control and stabilization of the fracture with just the immobilizer she will unable to be transferred safely in the [place] and even return for hygiene care.  They are interested at this point in proceeding with surgery to allow for safe transfers and mobilization and easier care and hygiene by the staff at her long-term nursing facility.  Objective:   VITALS:   Vitals:   08/07/21 2315 08/08/21 0140 08/08/21 0210 08/08/21 0605  BP: (!) 128/55 (!) 134/58 (!) 134/58 (!) 108/55  Pulse: 78 78 78 76  Resp: '16 15  16  '$ Temp: 98.5 F (36.9 C) 98.8 F (37.1 C) 98.8 F (37.1 C) 98.4 F (36.9 C)  TempSrc: Oral Oral Oral Oral  SpO2: 99% 98% 98% 99%  Weight:      Height:        RLE No traumatic wounds, ecchymosis, or rash  Tenderness about the right distal femu  Sens DPN, SPN, TN intact  Motor EHL, ext, flex 5/5  DP 2+, PT 2+, No significant edema  Lab Results  Component Value Date   WBC 12.0 (H) 08/08/2021   HGB 8.3 (L) 08/08/2021   HCT 25.5 (L) 08/08/2021   MCV 90.7 08/08/2021   PLT 178 08/08/2021   BMET    Component Value Date/Time   NA 140 08/08/2021 0350   NA 143 08/30/2017 1629   K 4.3 08/08/2021 0350   CL 103 08/08/2021 0350   CO2 30 08/08/2021 0350   GLUCOSE 112 (H) 08/08/2021 0350   BUN 56 (H) 08/08/2021 0350   BUN 32 08/30/2017 1629   CREATININE 0.78 08/08/2021 0350   CREATININE 1.02 (H) 12/30/2015 1138   CALCIUM 8.2 (L) 08/08/2021 0350   GFRNONAA >60 08/08/2021 0350   Assessment/Plan: Day of Surgery   Principal Problem:   Periprosthetic fracture around internal prosthetic right knee joint Active Problems:   Essential hypertension   Atrial fibrillation (HCC)   Diastolic heart failure (HCC)   Closed fracture of  right distal femur (HCC)  Right displaced periprosthetic femur fracture   Discussed again that operative fixation of her distal femur fracture would act as an internal cast to stabilize the fracture and allow for easier mobilization care and likely reduce pain control. Increase her potential for [name] stable union of her fracture as well.  Ultimately given her age and nonambulatory status could consider nonoperative treatment with [name] knee immobilizer alone however patient and family have concerns at this point that she would be unable to be safely transferred in the [place] lift and essentially remained bedridden.  Patient and family feel that management in the knee immobilizer past 2 days have not gone well.   Discussed concerns with open reduction internal fixation including risk of infection, wound healing problems, nonunion, loss of fixation given her poor bone quality and highly comminuted fracture, and proximity to the implant.  Expressed understanding of the risk and benefits and would like to proceed with surgery at this point.    [name] [name] 08/08/2021, 7:28 AM   Charlies Constable, MD  Contact information:   [phone] 7am-5pm epic message Dr. Zachery Dakins, or call office for patient follow up: [phone] After hours and holidays please check [email or web address] for group call information for [facility]

## 2021-08-08 NOTE — Anesthesia Preprocedure Evaluation (Addendum)
Anesthesia Evaluation  Patient identified by MRN, date of birth, ID band Patient awake    Reviewed: Allergy & Precautions, NPO status , Patient's Chart, lab work & pertinent test results  History of Anesthesia Complications (+) DIFFICULT AIRWAY and history of anesthetic complications  Airway Mallampati: II  TM Distance: >3 FB Neck ROM: Full    Dental no notable dental hx. (+) Teeth Intact, Dental Advisory Given   Pulmonary COPD,  oxygen dependent,    Pulmonary exam normal breath sounds clear to auscultation       Cardiovascular hypertension, Normal cardiovascular exam+ dysrhythmias Atrial Fibrillation  Rhythm:Regular Rate:Normal  2015 Echo  EF 60-65%   Neuro/Psych    GI/Hepatic Neg liver ROS,   Endo/Other  negative endocrine ROS  Renal/GU Lab Results      Component                Value               Date                      CREATININE               0.78                08/08/2021                K                        4.3                 08/08/2021                   Musculoskeletal  (+) Arthritis ,   Abdominal (+) + obese (BMI 31),   Peds  Hematology  (+) Blood dyscrasia, anemia , Lab Results      Component                Value               Date                          HGB                      8.3 (L)             08/08/2021                HCT                      25.5 (L)            08/08/2021                 PLT                      178                 08/08/2021              Anesthesia Other Findings All: Tolmetin, Sulfa , Morphine , NSAIDS  Hx of R breast CA  Reproductive/Obstetrics                           Anesthesia Physical Anesthesia Plan  ASA: 4  Anesthesia Plan: Spinal and General  Post-op Pain Management: Regional block* and Minimal or no pain anticipated   Induction:   PONV Risk Score and Plan: 2 and Treatment may vary due to age or medical condition and  Ondansetron  Airway Management Planned: LMA  Additional Equipment: None  Intra-op Plan:   Post-operative Plan:   Informed Consent: I have reviewed the patients History and Physical, chart, labs and discussed the procedure including the risks, benefits and alternatives for the proposed anesthesia with the patient or authorized representative who has indicated his/her understanding and acceptance.     Dental advisory given and Consent reviewed with POA  Plan Discussed with: CRNA and Anesthesiologist  Anesthesia Plan Comments: (Attempts at spinal Unsuccessful switch to GA)      Anesthesia Quick Evaluation

## 2021-08-08 NOTE — Plan of Care (Signed)
  Problem: Education: Goal: Knowledge of General Education information will improve Description: Including pain rating scale, medication(s)/side effects and non-pharmacologic comfort measures Outcome: Progressing   Problem: Pain Managment: Goal: General experience of comfort will improve Outcome: Progressing   Problem: Safety: Goal: Ability to remain free from injury will improve Outcome: Progressing   

## 2021-08-08 NOTE — Progress Notes (Signed)
PROGRESS NOTE    [name]  HBZ:[id number] DOB: 1922/05/13 DOA: 08/06/2021 PCP: System, Provider Not In     Brief Narrative:  88 year old white female history of hypertension, atrial fibrillation, not on anticoagulation due to history of GI bleed, not on rate control agent due to history of bradycardia, currently bedbound status who lives at [facility] skilled nursing facility presents to the ER after a fall out of bed.  Family states that the patient needs a Hoyer lift to get out of bed and into a wheelchair.  Family states that patient needed to use the bathroom today.  She was rolled onto her side to be able to use a bedpan.  She rolled out of bed and onto the floor.  She had pain on the right thigh when she struck the floor.  She was brought to the ER.     X-rays showed a right femoral periprosthetic fracture around the prior right total knee arthroplasty.   Initially patient refused surgery.  She had been given p.o. Vicodin.   After orthopedics is discussed with the patient and family, the family and patient are now reconsidering operative intervention.   [facility] hospitalist contacted for admission.    Subjective:  Late note entry She is seen after returned from surgery  She is aaox3, currently denies pain, family at bedside   Assessment & Plan:  Principal Problem:   Periprosthetic fracture around internal prosthetic right knee joint Active Problems:   Essential hypertension   Atrial fibrillation (HCC)   Diastolic heart failure (HCC)   Closed fracture of right distal femur (HCC)    Assessment and Plan:  * Periprosthetic fracture around internal prosthetic right knee joint -Perioperative risk calculation 30 day mortality about 6% but RCRI and gupta criteria, not sure how well the criteria applies to a 46yr with bedbound status at baseline. -Periprosthetic fracture around internal prosthetic right knee joint is an Acute illness/condition that poses a threat to life  or bodily function.  -s/p ORIF on 6/9 -will follow ortho recommendation.  Acute normocytic anemia -Does not appear to have any external signs of bleeding, reported history of GI bleeding from ulcer, will check FOBT -s/p  1 units PRBC transfusion on 6/8-6/9 night  Repeat cbc in am  Chronic Diastolic heart failure (HCC)/chronic hypoxic respiratory failure, Family report patient was put on oxygen a few months ago  On daily Lasix '40mg'$  at the nursing home Currently does not appear to be volume overloaded, received one-time IV lasix dose after PRBC transfusion Close monitor volume status  Atrial fibrillation (HCC) Stable.  Patient not on anticoagulants according to family.  Not on rate control agent due to history of bradycardia per review of cardiology notes  Essential hypertension Stable without any medication Monitor  PAD/ nonhealing wound on the left heel.   She was evaluated by vascular surgery , per vascular surgery "ABI was reviewed demonstrating severe atherosclerotic disease bilaterally.   The location on the lateral aspect of the heel is typical of pressure induced wounds from lying in bed for an extended period of time.  Patient is nonambulatory, a Hoyer lift at baseline, and is therefore not a revascularization candidate" Family report the wound has healed well with aggressive wound care at SNF  No BM documented, start stool softener   Body mass index is 30.96 kg/m..   I have Reviewed nursing notes, Vitals, pain scores, I/o's, Lab results and  imaging results since pt's last encounter, details please see discussion above  I  ordered the following labs:  Unresulted Labs (From admission, onward)     Start     Ordered   08/09/21 8937  Basic metabolic panel  Daily,   R     Question:  Specimen collection method  Answer:  Lab=Lab collect   08/08/21 0823   08/09/21 0500  Magnesium  Tomorrow morning,   R       Question:  Specimen collection method  Answer:  Lab=Lab collect    08/08/21 0823   08/08/21 0500  CBC  Daily,   R     Question:  Specimen collection method  Answer:  Lab=Lab collect   08/07/21 1654   Unscheduled  Occult blood card to lab, stool RN will collect  As needed,   R     Question:  Specimen to be collected by:  Answer:  RN will collect   08/07/21 1648             DVT prophylaxis: enoxaparin (LOVENOX) injection 40 mg Start: 08/09/21 0900 SCDs Start: 08/06/21 2351   Code Status:   Code Status: Full Code  Family Communication: Family at bedside  Disposition:    Dispo: The patient is from: SNF /long-term care              Anticipated d/c is to: SNF /long-term care              Anticipated d/c date is: TBD, need orthopedic clearance  Antimicrobials:    Anti-infectives (From admission, onward)    Start     Dose/Rate Route Frequency Ordered Stop   08/08/21 2200  ceFAZolin (ANCEF) IVPB 2g/100 mL premix        2 g 200 mL/hr over 30 Minutes Intravenous Every 8 hours 08/08/21 1745 08/09/21 1359   08/08/21 1418  vancomycin (VANCOCIN) powder  Status:  Discontinued          As needed 08/08/21 1418 08/08/21 1732   08/08/21 1115  ceFAZolin (ANCEF) IVPB 2g/100 mL premix        2 g 200 mL/hr over 30 Minutes Intravenous On call to O.R. 08/08/21 1028 08/08/21 1328          Objective: Vitals:   08/08/21 1645 08/08/21 1700 08/08/21 1715 08/08/21 1743  BP: 139/83 (!) 155/59 133/73 126/75  Pulse: 70 67 66 64  Resp: '20 15 17 16  '$ Temp:      TempSrc:      SpO2: 100% 98% 99% 93%  Weight:      Height:        Intake/Output Summary (Last 24 hours) at 08/08/2021 1915 Last data filed at 08/08/2021 1800 Gross per 24 hour  Intake 2489 ml  Output 400 ml  Net 2089 ml   Filed Weights   08/07/21 0000 08/07/21 0407  Weight: 95.1 kg 95.1 kg    Examination:  General exam: alert, awake, communicative,calm, NAD,  Respiratory system: Clear to auscultation. Respiratory effort normal. Cardiovascular system:  IRRR.  Gastrointestinal system:  Abdomen is nondistended, soft and nontender.  Normal bowel sounds heard. Central nervous system: Alert and oriented. No focal neurological deficits. Extremities: Right knee post op changes  Skin: A few scattered ecchymosis Psychiatry: Judgement and insight appear normal. Mood & affect appropriate.     Data Reviewed: I have personally reviewed  labs and visualized  imaging studies since the last encounter and formulate the plan        Scheduled Meds:  sodium chloride   Intravenous Once   [START ON  08/09/2021] enoxaparin  40 mg Subcutaneous Q24H   melatonin  3 mg Oral QHS   polyethylene glycol  17 g Oral Daily   senna-docusate  1 tablet Oral BID   Continuous Infusions:   ceFAZolin (ANCEF) IV       LOS: 2 days      Florencia Reasons, MD PhD FACP [facility]  Available via Epic secure chat 7am-7pm for nonurgent issues Please page for urgent issues To page the attending provider between 7A-7P or the covering provider during after hours 7P-7A, please log into the web site [email or web address] and access using universal [place] password for that web site. If you do not have the password, please call the hospital operator.    08/08/2021, 7:15 PM

## 2021-08-08 NOTE — TOC Initial Note (Signed)
Transition of Care [facility]) - Initial/Assessment Note   Patient Details  Name: [name] MRN: [id number] Date of Birth: 18-Sep-1922  Transition of Care [facility]) CM/SW Contact:    Sherie Don, LCSW Phone Number: 08/08/2021, 9:48 AM  Clinical Narrative: Patient is from [place]. TOC awaiting PT evaluation after surgery.  Expected Discharge Plan: [place] Barriers to Discharge: Continued Medical Work up  Patient Goals and CMS Choice Patient states their goals for this hospitalization and ongoing recovery are:: Return to [facility]  Expected Discharge Plan and Services Expected Discharge Plan: [place] In-house Referral: Clinical Social Work Discharge Planning Services: NA Post Acute Care Choice: [facility]             DME Arranged: N/A DME Agency: NA  Prior Living Arrangements/Services Lives with:: Facility Resident Patient language and need for interpreter reviewed:: Yes Do you feel safe going back to the place where you live?: Yes      Need for Family Participation in Patient Care: Yes (Comment) Care giver support system in place?: Yes (comment) Criminal Activity/Legal Involvement Pertinent to Current Situation/Hospitalization: No - Comment as needed  Activities of Daily Living Home Assistive Devices/Equipment: Hospital bed, [employer] (air mattress, wears "buddy boots" all the time) ADL Screening (condition at time of admission) Patient's cognitive ability adequate to safely complete daily activities?: No Is the patient deaf or have difficulty hearing?: Yes Does the patient have difficulty seeing, even when wearing glasses/contacts?: No Does the patient have difficulty concentrating, remembering, or making decisions?: No Patient able to express need for assistance with ADLs?: Yes Does the patient have difficulty dressing or bathing?: Yes Independently performs ADLs?: No Communication: Independent Dressing (OT): Needs  assistance Is this a change from baseline?: Pre-admission baseline Grooming: Needs assistance Is this a change from baseline?: Pre-admission baseline Feeding: Needs assistance Is this a change from baseline?: Pre-admission baseline Bathing: Needs assistance Is this a change from baseline?: Pre-admission baseline Toileting: Needs assistance Is this a change from baseline?: Pre-admission baseline In/Out Bed: Needs assistance Is this a change from baseline?: Pre-admission baseline Walks in Home: Needs assistance Is this a change from baseline?: Pre-admission baseline Does the patient have difficulty walking or climbing stairs?: Yes Weakness of Legs: Both Weakness of Arms/Hands: Both  Emotional Assessment Orientation: : Oriented to Self, Oriented to Place, Oriented to  Time, Oriented to Situation Alcohol / Substance Use: Not Applicable Psych Involvement: No (comment)  Admission diagnosis:  Primary osteoarthritis of left knee [M17.12] Periprosthetic fracture around internal prosthetic right knee joint [M97.11XA] Contusion of right hip, initial encounter [S70.01XA] Fall, initial encounter [W19.XXXA] Patient Active Problem List   Diagnosis Date Noted   Closed fracture of right distal femur ([place]) 08/07/2021   Periprosthetic fracture around internal prosthetic right knee joint 08/06/2021   Spinal stenosis of thoracolumbar region 11/02/2017   Right shoulder pain 05/24/2015   Primary osteoarthritis of left knee 05/24/2015   Encounter for therapeutic drug monitoring 09/15/2013   Hip pain 05/02/2012   Fall 05/02/2012   Long term (current) use of anticoagulants 08/08/2010   DEGENERATIVE JOINT DISEASE 08/06/2008   Malignant neoplasm of female breast ([place]) 08/03/2008   Essential hypertension 08/03/2008   Atrial fibrillation ([place]) 53/29/9242   Diastolic heart failure ([place]) 08/03/2008   EDEMA 08/03/2008   PCP:  System, Provider Not In Pharmacy:   [place], [place] - [address] [place] [address] Phone: [phone] Fax: [phone]  Readmission Risk  Interventions     No data to display

## 2021-08-08 NOTE — Transfer of Care (Signed)
Immediate Anesthesia Transfer of Care Note  Patient: [name]  Procedure(s) Performed: OPEN REDUCTION INTERNAL FIXATION (ORIF) DISTAL FEMUR FRACTURE (Right)  Patient Location: PACU  Anesthesia Type:General  Level of Consciousness: drowsy  Airway & Oxygen Therapy: Patient Spontanous Breathing and Patient connected to face mask oxygen  Post-op Assessment: Report given to RN and Post -op Vital signs reviewed and stable  Post vital signs: Reviewed and stable  Last Vitals:  Vitals Value Taken Time  BP 136/71 08/08/21 1635  Temp    Pulse 64 08/08/21 1639  Resp 14 08/08/21 1639  SpO2 100 % 08/08/21 1639  Vitals shown include unvalidated device data.  Last Pain:  Vitals:   08/08/21 1143  TempSrc: Oral  PainSc:       Patients Stated Pain Goal: 3 (56/38/93 7342)  Complications: No notable events documented.

## 2021-08-08 NOTE — Progress Notes (Signed)
Subjective: Patient reports pain as well controlled when not moving.  Had extensive discussion with the patient and her family yesterday and again this morning.  Per this discussion they have concerns that given the immobilizer and poor pain control and stabilization of the fracture with just the immobilizer she will unable to be transferred safely in the [place] and even return for hygiene care.  They are interested at this point in proceeding with surgery to allow for safe transfers and mobilization and easier care and hygiene by the staff at her long-term nursing facility.  Objective:   VITALS:   Vitals:   08/07/21 2315 08/08/21 0140 08/08/21 0210 08/08/21 0605  BP: (!) 128/55 (!) 134/58 (!) 134/58 (!) 108/55  Pulse: 78 78 78 76  Resp: '16 15  16  '$ Temp: 98.5 F (36.9 C) 98.8 F (37.1 C) 98.8 F (37.1 C) 98.4 F (36.9 C)  TempSrc: Oral Oral Oral Oral  SpO2: 99% 98% 98% 99%  Weight:      Height:        RLE No traumatic wounds, ecchymosis, or rash  Tenderness about the right distal femu  Sens DPN, SPN, TN intact  Motor EHL, ext, flex 5/5  DP 2+, PT 2+, No significant edema  Lab Results  Component Value Date   WBC 12.0 (H) 08/08/2021   HGB 8.3 (L) 08/08/2021   HCT 25.5 (L) 08/08/2021   MCV 90.7 08/08/2021   PLT 178 08/08/2021   BMET    Component Value Date/Time   NA 140 08/08/2021 0350   NA 143 08/30/2017 1629   K 4.3 08/08/2021 0350   CL 103 08/08/2021 0350   CO2 30 08/08/2021 0350   GLUCOSE 112 (H) 08/08/2021 0350   BUN 56 (H) 08/08/2021 0350   BUN 32 08/30/2017 1629   CREATININE 0.78 08/08/2021 0350   CREATININE 1.02 (H) 12/30/2015 1138   CALCIUM 8.2 (L) 08/08/2021 0350   GFRNONAA >60 08/08/2021 0350   Assessment/Plan: Day of Surgery   Principal Problem:   Periprosthetic fracture around internal prosthetic right knee joint Active Problems:   Essential hypertension   Atrial fibrillation (HCC)   Diastolic heart failure (HCC)   Closed fracture of  right distal femur (HCC)  Right displaced periprosthetic femur fracture   Discussed again that operative fixation of her distal femur fracture would act as an internal cast to stabilize the fracture and allow for easier mobilization care and likely reduce pain control. Increase her potential for a stable union of her fracture as well.  Ultimately given her age and nonambulatory status could consider nonoperative treatment with a knee immobilizer alone however patient and family have concerns at this point that she would be unable to be safely transferred in the [place] lift and essentially remained bedridden.  Patient and family feel that management in the knee immobilizer past 2 days have not gone well.   Discussed concerns with open reduction internal fixation including risk of infection, wound healing problems, nonunion, loss of fixation given her poor bone quality and highly comminuted fracture, and proximity to the implant.  Expressed understanding of the risk and benefits and would like to proceed with surgery at this point.    Dameion Briles A Isam Unrein 08/08/2021, 7:28 AM   Charlies Constable, MD  Contact information:   [phone] 7am-5pm epic message Dr. Zachery Dakins, or call office for patient follow up: [phone] After hours and holidays please check [email or web address] for group call information for [facility]

## 2021-08-09 DIAGNOSIS — I482 Chronic atrial fibrillation, unspecified: Secondary | ICD-10-CM | POA: Diagnosis not present

## 2021-08-09 DIAGNOSIS — M9711XA Periprosthetic fracture around internal prosthetic right knee joint, initial encounter: Secondary | ICD-10-CM | POA: Diagnosis not present

## 2021-08-09 DIAGNOSIS — I5032 Chronic diastolic (congestive) heart failure: Secondary | ICD-10-CM | POA: Diagnosis not present

## 2021-08-09 DIAGNOSIS — Z7401 Bed confinement status: Secondary | ICD-10-CM | POA: Diagnosis not present

## 2021-08-09 LAB — CBC
HCT: 26.4 % — ABNORMAL LOW (ref 36.0–46.0)
Hemoglobin: 8.7 g/dL — ABNORMAL LOW (ref 12.0–15.0)
MCH: 30 pg (ref 26.0–34.0)
MCHC: 33 g/dL (ref 30.0–36.0)
MCV: 91 fL (ref 80.0–100.0)
Platelets: 146 10*3/uL — ABNORMAL LOW (ref 150–400)
RBC: 2.9 MIL/uL — ABNORMAL LOW (ref 3.87–5.11)
RDW: 16 % — ABNORMAL HIGH (ref 11.5–15.5)
WBC: 11.6 10*3/uL — ABNORMAL HIGH (ref 4.0–10.5)
nRBC: 0 % (ref 0.0–0.2)

## 2021-08-09 LAB — BASIC METABOLIC PANEL
Anion gap: 6 (ref 5–15)
BUN: 40 mg/dL — ABNORMAL HIGH (ref 8–23)
CO2: 30 mmol/L (ref 22–32)
Calcium: 8.2 mg/dL — ABNORMAL LOW (ref 8.9–10.3)
Chloride: 105 mmol/L (ref 98–111)
Creatinine, Ser: 0.8 mg/dL (ref 0.44–1.00)
GFR, Estimated: >60 mL/min (ref >60)
Glucose, Bld: 105 mg/dL — ABNORMAL HIGH (ref 70–99)
Potassium: 4 mmol/L (ref 3.5–5.1)
Sodium: 141 mmol/L (ref 135–145)

## 2021-08-09 LAB — MAGNESIUM: Magnesium: 2.2 mg/dL (ref 1.7–2.4)

## 2021-08-09 MED ORDER — LEVOTHYROXINE SODIUM 50 MCG PO TABS
50.0000 ug | ORAL_TABLET | Freq: Every day | ORAL | Status: DC
  Administered 2021-08-10 – 2021-08-11 (×2): 50 ug via ORAL
  Filled 2021-08-09 (×2): qty 1

## 2021-08-09 MED ORDER — MOMETASONE FURO-FORMOTEROL FUM 200-5 MCG/ACT IN AERO
2.0000 | INHALATION_SPRAY | Freq: Two times a day (BID) | RESPIRATORY_TRACT | Status: DC
  Administered 2021-08-09 – 2021-08-11 (×5): 2 via RESPIRATORY_TRACT
  Filled 2021-08-09: qty 8.8

## 2021-08-09 MED ORDER — OYSTER SHELL CALCIUM/D3 500-5 MG-MCG PO TABS
1.0000 | ORAL_TABLET | Freq: Every day | ORAL | Status: DC
  Administered 2021-08-09 – 2021-08-11 (×3): 1 via ORAL
  Filled 2021-08-09 (×3): qty 1

## 2021-08-09 MED ORDER — ENSURE ENLIVE PO LIQD
237.0000 mL | Freq: Two times a day (BID) | ORAL | Status: DC
  Administered 2021-08-09 – 2021-08-11 (×4): 237 mL via ORAL
  Filled 2021-08-09 (×3): qty 237

## 2021-08-09 MED ORDER — BISACODYL 10 MG RE SUPP
10.0000 mg | Freq: Every day | RECTAL | Status: AC
  Administered 2021-08-09: 10 mg via RECTAL
  Filled 2021-08-09: qty 1

## 2021-08-09 MED ORDER — PRO-STAT SUGAR FREE PO LIQD
30.0000 mL | Freq: Every day | ORAL | Status: DC
  Filled 2021-08-09: qty 30

## 2021-08-09 MED ORDER — CYCLOBENZAPRINE HCL 5 MG PO TABS
5.0000 mg | ORAL_TABLET | Freq: Every day | ORAL | Status: DC
  Administered 2021-08-09 – 2021-08-10 (×2): 5 mg via ORAL
  Filled 2021-08-09 (×2): qty 1

## 2021-08-09 MED ORDER — FLEET ENEMA 7-19 GM/118ML RE ENEM: 1.0000 | ENEMA | Freq: Every day | RECTAL | Status: DC | PRN

## 2021-08-09 MED ORDER — PROSOURCE PLUS PO LIQD
30.0000 mL | Freq: Two times a day (BID) | ORAL | Status: DC
  Administered 2021-08-09 – 2021-08-11 (×4): 30 mL via ORAL
  Filled 2021-08-09 (×4): qty 30

## 2021-08-09 NOTE — NC FL2 (Signed)
[employer] LEVEL OF CARE SCREENING TOOL     IDENTIFICATION  Patient Name: [name] Birthdate: 09/24/1922 Sex: female Admission Date (Current Location): 08/06/2021  [facility] and [place] Number:  Herbalist and Address:  [facility],  [place], [place]      Provider Number: [id number]  Attending Physician Name and Address:  Florencia Reasons, MD  Relative Name and Phone Number:  Brinlynn, G[place] [phone]  [phone]  [name] Other   [phone]  Jolea, Dolle Brother [phone]  [phone]  Naida Sleight   [phone]    Current Level of Care: Hospital Recommended Level of Care: [place] Prior Approval Number:    Date Approved/Denied:   PASRR Number: [id number]  Discharge Plan: SNF    Current Diagnoses: Patient Active Problem List   Diagnosis Date Noted   Closed fracture of right distal femur ([place]) 08/07/2021   Periprosthetic fracture around internal prosthetic right knee joint 08/06/2021   Spinal stenosis of thoracolumbar region 11/02/2017   Right shoulder pain 05/24/2015   Primary osteoarthritis of left knee 05/24/2015   Encounter for therapeutic drug monitoring 09/15/2013   Hip pain 05/02/2012   Fall 05/02/2012   Long term (current) use of anticoagulants 08/08/2010   DEGENERATIVE JOINT DISEASE 08/06/2008   Malignant neoplasm of female breast ([place]) 08/03/2008   Essential hypertension 08/03/2008   Atrial fibrillation ([place]) 62/26/3335   Diastolic heart failure ([place]) 08/03/2008   EDEMA 08/03/2008    Orientation RESPIRATION BLADDER Height & Weight     Self, Situation, Place  O2 (4L) Incontinent Weight: 209 lb 10.5 oz (95.1 kg) Height:  '5\' 9"'$  (175.3 cm)  BEHAVIORAL SYMPTOMS/MOOD NEUROLOGICAL BOWEL NUTRITION STATUS   (N/A)  (N/A) Incontinent Diet (Regular)  AMBULATORY STATUS COMMUNICATION OF NEEDS Skin   Extensive Assist Verbally Surgical wounds                        Personal Care Assistance Level of Assistance  Bathing, Feeding, Dressing Bathing Assistance: Limited assistance Feeding assistance: Limited assistance Dressing Assistance: Limited assistance     Functional Limitations Info  Sight, Hearing, Speech Sight Info: Adequate Hearing Info: Adequate Speech Info: Adequate    SPECIAL CARE FACTORS FREQUENCY  PT (By licensed PT), OT (By licensed OT)     PT Frequency: Minimum 5x a week OT Frequency: Minimum 5x a week            Contractures Contractures Info: Not present    Additional Factors Info  Code Status, Allergies Code Status Info: Full Code Allergies Info: Morphine And Related   Sulfa Antibiotics   Meperidine Hcl   Nsaids   Tolmetin           Current Medications (08/09/2021):  This is the current hospital active medication list Current Facility-Administered Medications  Medication Dose Route Frequency Provider Last Rate Last Admin   (feeding supplement) PROSource Plus liquid 30 mL  30 mL Oral BID BM Florencia Reasons, MD   30 mL at 08/09/21 1437   acetaminophen (TYLENOL) tablet 650 mg  650 mg Oral Q6H PRN Willaim Sheng, MD   650 mg at 08/07/21 1557   Or   acetaminophen (TYLENOL) suppository 650 mg  650 mg Rectal Q6H PRN Willaim Sheng, MD       albuterol (PROVENTIL) (2.5 MG/3ML) 0.083% nebulizer solution 2.5 mg  2.5 mg Nebulization Q2H PRN Willaim Sheng, MD       bisacodyl (DULCOLAX) suppository 10  mg  10 mg Rectal Daily Florencia Reasons, MD       calcium-vitamin D (OSCAL WITH D) 500-5 MG-MCG per tablet 1 tablet  1 tablet Oral QAC breakfast Florencia Reasons, MD   1 tablet at 08/09/21 0953   cyclobenzaprine (FLEXERIL) tablet 5 mg  5 mg Oral QHS Florencia Reasons, MD       enoxaparin (LOVENOX) injection 40 mg  40 mg Subcutaneous Q24H Willaim Sheng, MD   40 mg at 08/09/21 0957   feeding supplement (ENSURE ENLIVE / ENSURE PLUS) liquid 237 mL  237 mL Oral BID BM Florencia Reasons, MD   237 mL at 08/09/21 1438   HYDROmorphone (DILAUDID)  injection 0.5 mg  0.5 mg Intravenous Q4H PRN Willaim Sheng, MD   0.5 mg at 08/08/21 0934   [START ON 08/10/2021] levothyroxine (SYNTHROID) tablet 50 mcg  50 mcg Oral Q0600 Florencia Reasons, MD       melatonin tablet 3 mg  3 mg Oral QHS Willaim Sheng, MD   3 mg at 08/08/21 2101   mometasone-formoterol (DULERA) 200-5 MCG/ACT inhaler 2 puff  2 puff Inhalation BID Florencia Reasons, MD   2 puff at 08/09/21 1421   oxyCODONE (Oxy IR/ROXICODONE) immediate release tablet 5-10 mg  5-10 mg Oral Q4H PRN Willaim Sheng, MD   5 mg at 08/09/21 0509   polyethylene glycol (MIRALAX / GLYCOLAX) packet 17 g  17 g Oral Daily Willaim Sheng, MD   17 g at 08/09/21 8921   senna-docusate (Senokot-S) tablet 1 tablet  1 tablet Oral BID Willaim Sheng, MD   1 tablet at 08/09/21 1941     Discharge Medications: Please see discharge summary for a list of discharge medications.  Relevant Imaging Results:  Relevant Lab Results:   Additional Information SSN [id number]  Ross Ludwig, LCSW

## 2021-08-09 NOTE — TOC Progression Note (Signed)
Transition of Care [facility]) - Progression Note    Patient Details  Name: [name] MRN: [id number] Date of Birth: [birth year]  Transition of Care [facility]) CM/SW Contact  Ross Ludwig, [place] Phone Number: 08/09/2021, 4:44 PM  Clinical Narrative:     CSW was informed by physician that patient may be ready for discharge tomorrow pending her medical progress.  Patient is from [facility] SNF.  Plan is for patient to return back to facility.  CSW spoke to [place] at [place], and she said patient can return tomorrow if she is medically ready for discharge.  CSW to continue to follow patient's progress throughout discharge planning.   Expected Discharge Plan: [place] Barriers to Discharge: Continued Medical Work up  Expected Discharge Plan and Services Expected Discharge Plan: [place] In-house Referral: Clinical Social Work Discharge Planning Services: NA Post Acute Care Choice: [place]                   DME Arranged: N/A DME Agency: NA                   Social Determinants of Health (SDOH) Interventions    Readmission Risk Interventions     No data to display

## 2021-08-09 NOTE — Evaluation (Signed)
Physical Therapy Evaluation Patient Details Name: [name] MRN: [id number] DOB: 1922/10/09 Today's Date: 08/09/2021  History of Present Illness  88 year old white female history of hypertension, atrial fibrillation,  GI bleed,  bradycardia, currently bedbound status who lives at [facility]. -rays showed a right femoral periprosthetic fracture around the prior right total knee arthroplasty. pt s/p ORIF R periprosthetic fx on 08/08/21.  Clinical Impression  Pt admitted with above diagnosis.  PT grossly bedbound at baseline. She was able to brush her hair, feed self and brush her teeth. She was able to assist with rolling per pt and family. Pt is oriented follows all commands consistently. Will benefit from SNF vs return to LTC at [facility]. Will follow in acute setting for therapeutic exercise and bed mobility/pressure relief.  Pt currently with functional limitations due to the deficits listed below (see PT Problem List). Pt will benefit from skilled PT to increase their independence and safety with mobility to allow discharge to the venue listed below.          Recommendations for follow up therapy are one component of a multi-disciplinary discharge planning process, led by the attending physician.  Recommendations may be updated based on patient status, additional functional criteria and insurance authorization.  Follow Up Recommendations Other (comment) (return to [facility])    Assistance Recommended at Discharge Frequent or constant Supervision/Assistance  Patient can return home with the following  Two people to help with bathing/dressing/bathroom    Equipment Recommendations None recommended by PT  Recommendations for Other Services       Functional Status Assessment Patient has had a recent decline in their functional status and/or demonstrates limited ability to make significant improvements in function in a reasonable and predictable amount of time     Precautions /  Restrictions Precautions Precautions: Fall Restrictions Weight Bearing Restrictions: Yes RLE Weight Bearing: Non weight bearing      Mobility  Bed Mobility Overal bed mobility: Needs Assistance Bed Mobility: Rolling           General bed mobility comments: limited by pain; max - total assist to partial roll to L    Transfers                   General transfer comment: unable    Ambulation/Gait               General Gait Details: non-amb at baseline  Stairs            Wheelchair Mobility    Modified Rankin (Stroke Patients Only)       Balance Overall balance assessment: History of Falls                                           Pertinent Vitals/Pain Pain Assessment Pain Assessment: Faces Faces Pain Scale: Hurts even more Pain Location: L knee, RLE, bil shoulders with activity Pain Descriptors / Indicators: Discomfort, Grimacing, Guarding Pain Intervention(s): Limited activity within patient's tolerance, Monitored during session, Premedicated before session, Repositioned    Home Living Family/patient expects to be discharged to:: Other (Comment)                   Additional Comments: pt is from LTC at [facility]    Prior Function Prior Level of Function : Needs assist       Physical Assist : Mobility (physical) Mobility (physical):  Bed mobility;Transfers   Mobility Comments: pt assists with rolling per pt and family. she uses a hoyer lift for OOB to w/c ADLs Comments: assist with bathing and dressing. pt feeds self and is able to brush her teeth.     Hand Dominance   Dominant Hand: Right    Extremity/Trunk Assessment   Upper Extremity Assessment Upper Extremity Assessment: RUE deficits/detail;LUE deficits/detail RUE Deficits / Details: grip good, elbow A/AROM grossly WFL; decr skin integrity RUE: Unable to fully assess due to pain;Shoulder pain with ROM LUE Deficits / Details: grip good, elbow  A/AROM grossly WFL; decr skin integrity LUE: Unable to fully assess due to pain;Shoulder pain with ROM    Lower Extremity Assessment Lower Extremity Assessment: RLE deficits/detail;LLE deficits/detail RLE Deficits / Details: ankle pf contracture, knee and hip flexion grossly 25 degrees, AAROM; strength 2/5; edema, decr skin integrity RLE: Unable to fully assess due to pain LLE Deficits / Details: knee flexion to ~ 30 degrees, limited by pain, knee extension and hip flexion 2+/5; hip add/abduction ~ 2+/5; edema, decr skin integrity LLE: Unable to fully assess due to pain       Communication   Communication: No difficulties  Cognition Arousal/Alertness: Awake/alert Behavior During Therapy: WFL for tasks assessed/performed Overall Cognitive Status: Within Functional Limits for tasks assessed                                 General Comments: pt is alert, HOH, follows all commands consistently        General Comments      Exercises     Assessment/Plan    PT Assessment Patient needs continued PT services  PT Problem List Decreased strength;Decreased mobility;Pain;Decreased activity tolerance;Decreased range of motion       PT Treatment Interventions Therapeutic exercise;Therapeutic activities;Patient/family education    PT Goals (Current goals can be found in the Care Plan section)  Acute Rehab PT Goals Patient Stated Goal: back to [facility] PT Goal Formulation: With patient Time For Goal Achievement: 08/23/21 Potential to Achieve Goals: Fair    Frequency Min 2X/week     Co-evaluation               AM-PAC PT "6 Clicks" Mobility  Outcome Measure Help needed turning from your back to your side while in a flat bed without using bedrails?: Total Help needed moving from lying on your back to sitting on the side of a flat bed without using bedrails?: Total Help needed moving to and from a bed to a chair (including a wheelchair)?: Total Help needed  standing up from a chair using your arms (e.g., wheelchair or bedside chair)?: Total Help needed to walk in hospital room?: Total Help needed climbing 3-5 steps with a railing? : Total 6 Click Score: 6    End of Session Equipment Utilized During Treatment: Other (comment) (prevalon boots bil) Activity Tolerance: Patient tolerated treatment well;Patient limited by pain Patient left: in bed;with call bell/phone within reach;with family/visitor present Nurse Communication: Mobility status;Need for lift equipment PT Visit Diagnosis: Muscle weakness (generalized) (M62.81);Pain Pain - Right/Left: Left (bil) Pain - part of body: Shoulder;Leg    Time: 0865-7846 PT Time Calculation (min) (ACUTE ONLY): 21 min   Charges:   PT Evaluation $PT Eval Low Complexity: [place], PT  Acute Rehab Dept ([facility]) [phone] Pager [phone]  08/09/2021   [facility] 08/09/2021, 1:08 PM

## 2021-08-09 NOTE — Progress Notes (Signed)
Subjective: 1 Day Post-Op Procedure(s) (LRB): OPEN REDUCTION INTERNAL FIXATION (ORIF) DISTAL FEMUR FRACTURE (Right) Patient reports pain as mild.    Objective: Vital signs in last 24 hours: Temp:  [96.5 F (35.8 C)-99 F (37.2 C)] 99 F (37.2 C) (06/10 0453) Pulse Rate:  [64-77] 75 (06/10 0453) Resp:  [15-20] 18 (06/10 0453) BP: (110-155)/(50-83) 132/57 (06/10 0453) SpO2:  [84 %-100 %] 95 % (06/10 0453)  Intake/Output from previous day: 06/09 0701 - 06/10 0700 In: 2169 [P.O.:60; I.V.:1000; Blood:609; IV [id number]:478] Out: 250 [Blood:250] Intake/Output this shift: Total I/O In: 240 [P.O.:240] Out: 350 [Urine:350]  Recent Labs    08/07/21 0346 08/08/21 0350 08/09/21 0358  HGB 7.7* 8.3* 8.7*   Recent Labs    08/08/21 0350 08/09/21 0358  WBC 12.0* 11.6*  RBC 2.81* 2.90*  HCT 25.5* 26.4*  PLT 178 146*   Recent Labs    08/08/21 0350 08/09/21 0358  NA 140 141  K 4.3 4.0  CL 103 105  CO2 30 30  BUN 56* 40*  CREATININE 0.78 0.80  GLUCOSE 112* 105*  CALCIUM 8.2* 8.2*   No results for input(s): "LABPT", "INR" in the last 72 hours.  Sensation intact distally Intact pulses distally Incision: dressing C/D/I   Assessment/Plan: 1 Day Post-Op Procedure(s) (LRB): OPEN REDUCTION INTERNAL FIXATION (ORIF) DISTAL FEMUR FRACTURE (Right)  Post op recs: WB: NWB RLE Abx: ancef x23 hours post op Dressing: keep intact until follow up, change PRN if soiled or saturated. DVT prophylaxis: DVT prophylaxis x4 weeks Lovenox or equivalent  Follow up: 2 weeks after surgery for a wound check with Dr. Zachery Dakins at [facility].  Address: [address] [place], [place], [place] [address]  Office Phone: [phone]     Chriss Czar 08/09/2021, 11:17 AM

## 2021-08-09 NOTE — Progress Notes (Signed)
PROGRESS NOTE    [name]  OHY:[id number] DOB: [birth year] DOA: 08/06/2021 PCP: System, Provider Not In     Brief Narrative:  88 year old white female history of hypertension, atrial fibrillation, not on anticoagulation due to history of GI bleed, not on rate control agent due to history of bradycardia, currently bedbound status who lives at [facility] skilled nursing facility presents to the ER after a fall out of bed.  Family states that the patient needs a Hoyer lift to get out of bed and into a wheelchair.  Family states that patient needed to use the bathroom today.  She was rolled onto her side to be able to use a bedpan.  She rolled out of bed and onto the floor.  She had pain on the right thigh when she struck the floor.  She was brought to the ER.     X-rays showed a right femoral periprosthetic fracture around the prior right total knee arthroplasty.   Initially patient refused surgery.  She had been given p.o. Vicodin.   After orthopedics is discussed with the patient and family, the family and patient are now reconsidering operative intervention.   [facility] hospitalist contacted for admission.    Subjective:  POD#1 No acute event overnight, she is aaox3, reports no pain at rest but pain with activity  family at bedside   Assessment & Plan:  Principal Problem:   Periprosthetic fracture around internal prosthetic right knee joint Active Problems:   Essential hypertension   Atrial fibrillation (HCC)   Diastolic heart failure (HCC)   Closed fracture of right distal femur (HCC)    Assessment and Plan:  * Periprosthetic fracture around internal prosthetic right knee joint -Perioperative risk calculation 30 day mortality about 6% but RCRI and gupta criteria, not sure how well the criteria applies to a 37yr with bedbound status at baseline. -Periprosthetic fracture around internal prosthetic right knee joint is an Acute illness/condition that poses a threat to life  or bodily function.  -s/p ORIF on 6/9 -will follow ortho recommendation.  Acute normocytic anemia -Does not appear to have any external signs of bleeding, reported history of GI bleeding from ulcer, will check FOBT -s/p  1 units PRBC transfusion on 6/8-6/9 night  -cbc stable posttransfusion  Chronic Diastolic heart failure (HCC)/chronic hypoxic respiratory failure, Family report patient was put on oxygen a few months ago  On daily Lasix '40mg'$  at the nursing home Currently does not appear to be volume overloaded, received one-time IV lasix dose after PRBC transfusion Hold Lasix for now ,close monitor volume status  Atrial fibrillation (HCC) Stable.  Patient not on anticoagulants according to family.  Not on rate control agent due to history of bradycardia per review of cardiology notes  Essential hypertension Stable without any medication Monitor  PAD/ nonhealing wound on the left heel.   She was evaluated by vascular surgery , per vascular surgery "ABI was reviewed demonstrating severe atherosclerotic disease bilaterally.   The location on the lateral aspect of the heel is typical of pressure induced wounds from lying in bed for an extended period of time.  Patient is nonambulatory, a Hoyer lift at baseline, and is therefore not a revascularization candidate" Family report the wound has healed well with aggressive wound care at SNF  No BM documented, start stool softener, suppository, may need enema   Body mass index is 30.96 kg/m..   I have Reviewed nursing notes, Vitals, pain scores, I/o's, Lab results and  imaging results since pt's last  encounter, details please see discussion above  I ordered the following labs:  Unresulted Labs (From admission, onward)     Start     Ordered   08/09/21 5366  Basic metabolic panel  Daily,   R     Question:  Specimen collection method  Answer:  Lab=Lab collect   08/08/21 0823   08/08/21 0500  CBC  Daily,   R     Question:  Specimen  collection method  Answer:  Lab=Lab collect   08/07/21 1654   Unscheduled  Occult blood card to lab, stool RN will collect  As needed,   R     Question:  Specimen to be collected by:  Answer:  RN will collect   08/07/21 1648             DVT prophylaxis: enoxaparin (LOVENOX) injection 40 mg Start: 08/09/21 0900 SCDs Start: 08/06/21 2351   Code Status:   Code Status: Full Code  Family Communication: Family at bedside  Disposition:    Dispo: The patient is from: SNF /long-term care              Anticipated d/c is to: SNF /long-term care              Anticipated d/c date is: tomorrow  Antimicrobials:    Anti-infectives (From admission, onward)    Start     Dose/Rate Route Frequency Ordered Stop   08/08/21 2200  ceFAZolin (ANCEF) IVPB 2g/100 mL premix        2 g 200 mL/hr over 30 Minutes Intravenous Every 8 hours 08/08/21 1745 08/09/21 1249   08/08/21 1418  vancomycin (VANCOCIN) powder  Status:  Discontinued          As needed 08/08/21 1418 08/08/21 1732   08/08/21 1115  ceFAZolin (ANCEF) IVPB 2g/100 mL premix        2 g 200 mL/hr over 30 Minutes Intravenous On call to O.R. 08/08/21 1028 08/08/21 1328          Objective: Vitals:   08/09/21 0453 08/09/21 1119 08/09/21 1329 08/09/21 1425  BP: (!) 132/57 (!) 120/55 (!) 139/55   Pulse: 75 72    Resp: '18 18 16   '$ Temp: 99 F (37.2 C) 98 F (36.7 C) 98.1 F (36.7 C)   TempSrc: Oral Oral Oral   SpO2: 95% 96%  96%  Weight:      Height:        Intake/Output Summary (Last 24 hours) at 08/09/2021 1747 Last data filed at 08/09/2021 1742 Gross per 24 hour  Intake 660 ml  Output 950 ml  Net -290 ml   Filed Weights   08/07/21 0000 08/07/21 0407  Weight: 95.1 kg 95.1 kg    Examination:  General exam: alert, awake, communicative,calm, NAD,  Respiratory system: Clear to auscultation. Respiratory effort normal. Cardiovascular system:  IRRR.  Gastrointestinal system: Abdomen is nondistended, soft and nontender.   Normal bowel sounds heard. Central nervous system: Alert and oriented. No focal neurological deficits. Extremities: Right knee post op changes  Skin: A few scattered ecchymosis Psychiatry: Judgement and insight appear normal. Mood & affect appropriate.     Data Reviewed: I have personally reviewed  labs and visualized  imaging studies since the last encounter and formulate the plan        Scheduled Meds:  (feeding supplement) PROSource Plus  30 mL Oral BID BM   bisacodyl  10 mg Rectal Daily   calcium-vitamin D  1 tablet Oral  QAC breakfast   cyclobenzaprine  5 mg Oral QHS   enoxaparin  40 mg Subcutaneous Q24H   feeding supplement  237 mL Oral BID BM   [START ON 08/10/2021] levothyroxine  50 mcg Oral Q0600   melatonin  3 mg Oral QHS   mometasone-formoterol  2 puff Inhalation BID   polyethylene glycol  17 g Oral Daily   senna-docusate  1 tablet Oral BID   Continuous Infusions:     LOS: 3 days      Florencia Reasons, MD PhD FACP [facility]  Available via Epic secure chat 7am-7pm for nonurgent issues Please page for urgent issues To page the attending provider between 7A-7P or the covering provider during after hours 7P-7A, please log into the web site [email or web address] and access using universal [place] password for that web site. If you do not have the password, please call the hospital operator.    08/09/2021, 5:47 PM

## 2021-08-09 NOTE — Plan of Care (Signed)

## 2021-08-10 DIAGNOSIS — M9711XA Periprosthetic fracture around internal prosthetic right knee joint, initial encounter: Secondary | ICD-10-CM | POA: Diagnosis not present

## 2021-08-10 DIAGNOSIS — I482 Chronic atrial fibrillation, unspecified: Secondary | ICD-10-CM | POA: Diagnosis not present

## 2021-08-10 DIAGNOSIS — Z7401 Bed confinement status: Secondary | ICD-10-CM | POA: Diagnosis not present

## 2021-08-10 DIAGNOSIS — I5032 Chronic diastolic (congestive) heart failure: Secondary | ICD-10-CM | POA: Diagnosis not present

## 2021-08-10 LAB — BASIC METABOLIC PANEL
Anion gap: 7 (ref 5–15)
BUN: 35 mg/dL — ABNORMAL HIGH (ref 8–23)
CO2: 29 mmol/L (ref 22–32)
Calcium: 8.2 mg/dL — ABNORMAL LOW (ref 8.9–10.3)
Chloride: 104 mmol/L (ref 98–111)
Creatinine, Ser: 0.68 mg/dL (ref 0.44–1.00)
GFR, Estimated: >60 mL/min (ref >60)
Glucose, Bld: 102 mg/dL — ABNORMAL HIGH (ref 70–99)
Potassium: 3.9 mmol/L (ref 3.5–5.1)
Sodium: 140 mmol/L (ref 135–145)

## 2021-08-10 LAB — CBC
HCT: 27.6 % — ABNORMAL LOW (ref 36.0–46.0)
Hemoglobin: 9.1 g/dL — ABNORMAL LOW (ref 12.0–15.0)
MCH: 30.2 pg (ref 26.0–34.0)
MCHC: 33 g/dL (ref 30.0–36.0)
MCV: 91.7 fL (ref 80.0–100.0)
Platelets: 170 10*3/uL (ref 150–400)
RBC: 3.01 MIL/uL — ABNORMAL LOW (ref 3.87–5.11)
RDW: 15.9 % — ABNORMAL HIGH (ref 11.5–15.5)
WBC: 12 10*3/uL — ABNORMAL HIGH (ref 4.0–10.5)
nRBC: 0 % (ref 0.0–0.2)

## 2021-08-10 MED ORDER — APIXABAN 2.5 MG PO TABS: 2.5000 mg | ORAL_TABLET | Freq: Two times a day (BID) | ORAL | 0 refills | Status: DC

## 2021-08-10 MED ORDER — ENOXAPARIN SODIUM 40 MG/0.4ML IJ SOSY: 40.0000 mg | PREFILLED_SYRINGE | INTRAMUSCULAR | 0 refills | Status: DC

## 2021-08-10 MED ORDER — FUROSEMIDE 40 MG PO TABS
40.0000 mg | ORAL_TABLET | Freq: Every day | ORAL | Status: DC
  Administered 2021-08-11: 40 mg via ORAL
  Filled 2021-08-10: qty 1

## 2021-08-10 MED ORDER — OXYCODONE HCL 5 MG PO TABS: 5.0000 mg | ORAL_TABLET | ORAL | 0 refills | Status: DC | PRN

## 2021-08-10 NOTE — Progress Notes (Signed)
Subjective: 2 Days Post-Op Procedure(s) (LRB): OPEN REDUCTION INTERNAL FIXATION (ORIF) DISTAL FEMUR FRACTURE (Right) Patient reports pain as mild.  Nephew at bedside.  Ortho issues stable.  Objective: Vital signs in last 24 hours: Temp:  [98 F (36.7 C)-98.2 F (36.8 C)] 98.2 F (36.8 C) (06/11 0545) Pulse Rate:  [58-80] 58 (06/11 0545) Resp:  [16-18] 18 (06/11 0545) BP: (120-139)/(55-66) 135/66 (06/11 0545) SpO2:  [92 %-96 %] 96 % (06/11 0744) FiO2 (%):  [36 %] 36 % (06/11 0744)  Intake/Output from previous day: 06/10 0701 - 06/11 0700 In: 600 [P.O.:600] Out: 950 [Urine:950] Intake/Output this shift: Total I/O In: 240 [P.O.:240] Out: -   Recent Labs    08/08/21 0350 08/09/21 0358 08/10/21 0312  HGB 8.3* 8.7* 9.1*   Recent Labs    08/09/21 0358 08/10/21 0312  WBC 11.6* 12.0*  RBC 2.90* 3.01*  HCT 26.4* 27.6*  PLT 146* 170   Recent Labs    08/09/21 0358 08/10/21 0312  NA 141 140  K 4.0 3.9  CL 105 104  CO2 30 29  BUN 40* 35*  CREATININE 0.80 0.68  GLUCOSE 105* 102*  CALCIUM 8.2* 8.2*   No results for input(s): "LABPT", "INR" in the last 72 hours.  Sensation intact distally Intact pulses distally Incision: dressing C/D/I   Assessment/Plan: 2 Days Post-Op Procedure(s) (LRB): OPEN REDUCTION INTERNAL FIXATION (ORIF) DISTAL FEMUR FRACTURE (Right)  Post op recs: WB: NWB RLE Abx: ancef x23 hours post op completed Dressing: keep intact until follow up, change PRN if soiled or saturated. DVT prophylaxis: DVT prophylaxis x4 weeks Lovenox Follow up: 2 weeks after surgery for a wound check with Dr. Zachery Dakins at [facility].  Address: [address] [place], [place], [place] [address]  Office Phone: [phone]     Corinne Ports 08/10/2021, 9:16 AM

## 2021-08-10 NOTE — Progress Notes (Signed)
Patient had a large bowel movement at 20:45 last night.

## 2021-08-10 NOTE — Plan of Care (Signed)
  Problem: Activity: Goal: Risk for activity intolerance will decrease 08/10/2021 0755 by Elza Rafter, RN Outcome: Progressing 08/10/2021 0755 by Elza Rafter, RN Outcome: Progressing   Problem: Nutrition: Goal: Adequate nutrition will be maintained Outcome: Progressing   Problem: Clinical Measurements: Goal: Respiratory complications will improve 08/10/2021 0755 by Elza Rafter, RN Outcome: Progressing 08/10/2021 0755 by Elza Rafter, RN Outcome: Progressing   Problem: Clinical Measurements: Goal: Cardiovascular complication will be avoided 08/10/2021 0755 by Elza Rafter, RN Outcome: Progressing 08/10/2021 0755 by Elza Rafter, RN Outcome: Progressing   Problem: Safety: Goal: Ability to remain free from injury will improve Outcome: Progressing   Problem: Pain Managment: Goal: General experience of comfort will improve Outcome: Progressing

## 2021-08-10 NOTE — Discharge Summary (Incomplete)
Discharge Summary  [name] QQI:[id number] DOB: 1922-12-03  PCP: System, Provider Not In  Admit date: 08/06/2021 Discharge date: 08/10/2021  Time spent: 7mns, more than 50% time spent on coordination of care.   Recommendations for Outpatient Follow-up:  F/u with SNF MD  for hospital discharge follow up, repeat cbc/bmp at follow up F/u with ortho in two weeks    Discharge Diagnoses:  Active Hospital Problems   Diagnosis Date Noted   Periprosthetic fracture around internal prosthetic right knee joint 08/06/2021    Priority: High   Essential hypertension 08/03/2008    Priority: Low   [place]rial [place] ([place] 08/03/2008    Priority: Low   Diastolic heart failure ([place] 08/03/2008    Priority: Low   [place] of right distal [place] ([place] 08/07/2021    Resolved Hospital Problems  No resolved problems to display.    Discharge Condition: stable  Diet recommendation: heart healthy/carb modified  Filed Weights   08/07/21 0000 08/07/21 0407  Weight: 95.1 kg 95.1 kg    [facility]  [facility]  Principal Problem:   Periprosthetic fracture around internal prosthetic right knee joint Active Problems:   Essential hypertension   Atrial fibrillation (HCC)   Diastolic heart failure (HCC)   Closed fracture of right distal femur (HCC)   Assessment and Plan:       Post op recs: WB: NWB RLE Abx: ancef x23 hours post op Dressing: keep intact until follow up, change PRN if soiled or saturated. DVT prophylaxis: DVT prophylaxis x4 weeks Lovenox or equivalent  Follow up: 2 weeks after surgery [facility]   * Periprosthetic fracture around internal prosthetic right knee joint -Perioperative risk calculation 30 day mortality about 6% but RCRI and gupta criteria, not sure how well the criteria applies to a 88yrwith bedbound status at baseline. -Periprosthetic fracture around  internal prosthetic right knee joint is an Acute illness/condition that poses a threat to life or bodily function.  -s/p ORIF on 6/9 -will follow ortho recommendation.   Acute normocytic anemia -Does not appear to have any external signs of bleeding, reported history of GI bleeding from ulcer, will check FOBT -s/p  1 units PRBC transfusion on 6/8-6/9 night  -cbc stable posttransfusion   Chronic Diastolic heart failure (HCC)/chronic hypoxic respiratory failure, Family report patient was put on oxygen a few months ago  On daily Lasix '40mg'$  at the nursing home Currently does not appear to be volume overloaded, received one-time IV lasix dose after PRBC transfusion Hold Lasix for now ,close monitor volume status   Atrial fibrillation (HCC) Stable.  Patient not on anticoagulants according to family.  Not on rate control agent due to history of bradycardia per review of cardiology notes   Essential hypertension Stable without any medication Monitor   PAD/ nonhealing wound on the left heel.   She was evaluated by vascular surgery , per vascular surgery "ABI was reviewed demonstrating severe atherosclerotic disease bilaterally.   The location on the lateral aspect of the heel is typical of pressure induced wounds from lying in bed for an extended period of time.  Patient is nonambulatory, a Hoyer lift at baseline, and is therefore not a revascularization candidate" Family report the wound has healed well with aggressive wound care at SNF   No BM documented, start stool softener, suppository, may need enema    Body mass index is 30.96 kg/m.. Marland Kitchen   Discharge  Exam: BP 135/66 (BP Location: Left Arm)   Pulse (!) 58   Temp 98.2 F (36.8 C)   Resp 18   Ht '5\' 9"'$  (1.753 m)   Wt 95.1 kg   SpO2 92%   BMI 30.96 kg/m   General: * Cardiovascular: * Respiratory: *    Discharge Instructions     Diet general   Complete by: As directed    Discharge wound care:   Complete by: As directed     Per orthorecommendation   Increase activity slowly   Complete by: As directed       Allergies as of 08/10/2021       Reactions   Morphine And Related Other (See Comments)   Other Reaction: mild Intolerance, Allergy   Sulfa Antibiotics    Other reaction(s): Other (See Comments) Other Reaction: mild Intolerance, Allergy   Meperidine Hcl Nausea Only   Nsaids    Other reaction(s): Other (See Comments) Bleeding ulcer   Tolmetin Other (See Comments)   Other reaction(s): Other (See Comments) Bleeding ulcer     Med Rec must be completed prior to using this [facility]*        Discharge Care Instructions  (From admission, onward)           Start     Ordered   08/10/21 0000  Discharge wound care:       Comments: Per orthorecommendation   08/10/21 0724           Allergies  Allergen Reactions   Morphine And Related Other (See Comments)    Other Reaction: mild Intolerance, Allergy   Sulfa Antibiotics     Other reaction(s): Other (See Comments) Other Reaction: mild Intolerance, Allergy   Meperidine Hcl Nausea Only   Nsaids     Other reaction(s): Other (See Comments) Bleeding ulcer   Tolmetin Other (See Comments)    Other reaction(s): Other (See Comments) Bleeding ulcer    Follow-up Information     Your primary care provider. Schedule an appointment as soon as possible for a visit .          [place] [facility] .   Specialty: Emergency Medicine Why: If symptoms worsen Contact information: [place] [id number] [place] [phone]        Willaim Sheng, MD Follow up in 2 week(s).   Specialty: Orthopedic Surgery Contact information: [place] [address] [phone]                  The results of significant diagnostics from this hospitalization (including imaging, microbiology, ancillary and laboratory) are listed below for reference.     Significant Diagnostic Studies: DG FEMUR, MIN 2 VIEWS RIGHT  Result Date: 08/08/2021 CLINICAL DATA:  Fracture distal right femur EXAM: RIGHT FEMUR 2 VIEWS COMPARISON:  08/06/2021 FINDINGS: There is interval internal fixation of severely comminuted fracture of distal shaft and distal metaphyseal region of right femur. There is a metallic plate and multiple surgical screws. There is improvement in alignment of fracture fragments. There is previous right hip and right knee arthroplasty. IMPRESSION: Interval internal fixation of comminuted fracture of distal right femur. Electronically Signed   By: Elmer Picker M.D.   On: 08/08/2021 17:01   DG FEMUR, MIN 2 VIEWS RIGHT  Result Date: 08/08/2021 CLINICAL DATA:  ORIF of right femur fracture. EXAM: RIGHT FEMUR 2 VIEWS COMPARISON:  Right femur and knee radiographs 08/06/2021 FLUOROSCOPY: Fluoroscopy Time: 1 minute  38 seconds Radiation Exposure Index: 14.54 mGy FINDINGS: 6 intraoperative spot fluoroscopic images are provided. A pre-existing right hip arthroplasty and right knee arthroplasty are again noted. These images demonstrate interval plate and screw fixation of a highly comminuted, periprosthetic fracture of the distal femur. Alignment appears improved. IMPRESSION: Intraoperative images during ORIF of a distal femur fracture. Electronically Signed   By: Logan Bores M.D.   On: 08/08/2021 16:01   DG C-Arm 1-60 Min-No Report  Result Date: 08/08/2021 Fluoroscopy was utilized by the requesting physician.  No radiographic interpretation.   DG C-Arm 1-60 Min-No Report  Result Date: 08/08/2021 Fluoroscopy was utilized by the requesting physician.  No radiographic interpretation.   DG C-Arm 1-60 Min-No Report  Result Date: 08/08/2021 Fluoroscopy was utilized by the requesting physician.  No radiographic interpretation.   CT KNEE RIGHT WO CONTRAST  Result Date: 08/06/2021 CLINICAL DATA:  Periprosthetic fracture suspected. EXAM: CT OF THE RIGHT KNEE  WITHOUT CONTRAST TECHNIQUE: Multidetector CT imaging of the right knee was performed according to the standard protocol. Multiplanar CT image reconstructions were also generated. RADIATION DOSE REDUCTION: This exam was performed according to the departmental dose-optimization program which includes automated exposure control, adjustment of the mA and/or kV according to patient size and/or use of iterative reconstruction technique. COMPARISON:  X-ray same day FINDINGS: Bones/Joint/Cartilage Total knee arthroplasty is present in anatomic alignment. There is a severely comminuted fracture of the distal femur extending to the and surrounding the knee arthroplasty. There is 1/2 shaft with anterior and medial displacement of the proximal fracture fragment. Numerous small free fracture fragments are identified. Joint effusion is present, but not well evaluated secondary to streak artifact. Ligaments Suboptimally assessed by CT. Muscles and Tendons There is mild intramuscular edema surrounding the fracture. Soft tissues There is mild subcutaneous edema surrounding the knee. There also some nonspecific subcutaneous calcifications anterior to the patellar tendon. IMPRESSION: 1. Total knee arthroplasty appears in anatomic alignment. 2. Markedly comminuted displaced fracture of the distal femur extending to in the surrounding the femoral component of the arthroplasty. 3. Joint effusion. Electronically Signed   By: Ronney Asters M.D.   On: 08/06/2021 22:36   CT PELVIS WO CONTRAST  Result Date: 08/06/2021 CLINICAL DATA:  Hip trauma. Fracture suspected. Slid off bed landing on right side. Right hip and left thigh pain. EXAM: CT PELVIS WITHOUT CONTRAST TECHNIQUE: Multidetector CT imaging of the pelvis was performed following the standard protocol without intravenous contrast. RADIATION DOSE REDUCTION: This exam was performed according to the departmental dose-optimization program which includes automated exposure control,  adjustment of the mA and/or kV according to patient size and/or use of iterative reconstruction technique. COMPARISON:  Right femur radiographs 08/06/2021; left femur radiographs 08/06/2021; CT pelvis 05/02/2012 FINDINGS: Urinary Tract: Within the limitations of streak artifact, no gross bladder abnormality is seen. Bowel: The visualized bowel is grossly unremarkable. Normal appendix extending posteriorly in inferiorly from the cecum. Vascular/Lymphatic: There again moderate to high-grade atherosclerotic calcifications. There is again mild dilatation of the bilateral common iliac arteries measuring up to 1.6 cm. No lymphadenopathy is seen. Reproductive:  The uterus is present.  No gross adnexal abnormality. Other:  None Musculoskeletal: Status post bilateral total hip arthroplasty. Within the limitation of associated metallic streak artifact, no definite perihardware lucency is seen to indicate hardware failure or loosening. There is diffuse decreased bone mineralization. No definite acute fracture is seen within this limitation. Mild bilateral sacroiliac joint space narrowing and subchondral sclerosis. Mild-to-moderate pubic symphysis joint space narrowing and peripheral  osteophytosis. Unchanged mild chronic enthesopathic change at the bilateral iliopsoas tendon insertions on the lesser trochanters. Moderate to severe L4-5 and L5-S1 facet joint arthropathy. There is high-grade fatty infiltration atrophy of the bilateral gluteus minimus musculature. Moderate atrophy and fatty infiltration of the bilateral gluteus medius musculature. Within the limitations of the streak artifact, there appears to be mildly increased density within the visualized proximal lateral aspect of the vastus lateralis muscle (axial series 5, images 100 48 through 162, possibly an intramuscular hematoma. IMPRESSION: 1. Status post bilateral total hip arthroplasty. 2. No acute fracture is identified. Electronically Signed   By: Yvonne Kendall  M.D.   On: 08/06/2021 17:59   CT Cervical Spine Wo Contrast  Result Date: 08/06/2021 CLINICAL DATA:  Neck trauma.  Landed on right side. EXAM: CT CERVICAL SPINE WITHOUT CONTRAST TECHNIQUE: Multidetector CT imaging of the cervical spine was performed without intravenous contrast. Multiplanar CT image reconstructions were also generated. RADIATION DOSE REDUCTION: This exam was performed according to the departmental dose-optimization program which includes automated exposure control, adjustment of the mA and/or kV according to patient size and/or use of iterative reconstruction technique. COMPARISON:  None Available. FINDINGS: Alignment: Anatomic alignment. No static listhesis. 2 mm anterolisthesis of C3 on C4 and C7 on T1. Skull base and vertebrae: No acute fracture. No aggressive lytic or sclerotic osseous lesion. Generalized osteopenia. Soft tissues and spinal canal: Intraspinal soft tissues are not fully imaged on this examination due to poor soft tissue contrast, but there is no gross soft tissue abnormality. No prevertebral fluid or swelling. No visible canal hematoma. Disc levels: Degenerative disease with disc height loss C3-4, C4-5, C5-6 and C6-7. bilateral facet arthropathy at C2-3, C3-4, C4-5, C5-6, C6-7 and C7-T1. Bilateral foraminal stenosis at C3-4. Bilateral foraminal stenosis at C4-5, C5-6, and C6-7. Upper chest: Lung apices are clear. Other: Severe osteoarthritis of the glenohumeral joints bilaterally. 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 IMPRESSION: 1.  No acute osseous injury of the cervical spine. 2. Cervical spine spondylosis as described above. Electronically Signed   By: Kathreen Devoid M.D.   On: 08/06/2021 17:11   DG Femur Min 2 Views Right  Result Date: 08/06/2021 CLINICAL DATA:  Status post fall, leg pain EXAM: RIGHT KNEE - COMPLETE 4+ VIEW; RIGHT FEMUR 2 VIEWS COMPARISON:  None Available. FINDINGS: Right total hip arthroplasty without failure or complication.  Left knee arthroplasty. Periprosthetic distal femoral diametaphyseal fracture with severe comminution and 2 cm of anterior displacement. Scalloped appearance of the medial margin of the proximal tibial metaphysis likely postsurgical. No other fracture or dislocation.  No aggressive osseous lesion. Soft tissue are unremarkable. No radiopaque foreign body or soft tissue emphysema. IMPRESSION: 1. Right total hip arthroplasty without failure or complication. 2. Left knee arthroplasty. Periprosthetic distal femoral diametaphyseal fracture with severe comminution. Electronically Signed   By: Kathreen Devoid M.D.   On: 08/06/2021 17:01   DG Knee Complete 4 Views Right  Result Date: 08/06/2021 CLINICAL DATA:  Status post fall, leg pain EXAM: RIGHT KNEE - COMPLETE 4+ VIEW; RIGHT FEMUR 2 VIEWS COMPARISON:  None Available. FINDINGS: Right total hip arthroplasty without failure or complication. Left knee arthroplasty. Periprosthetic distal femoral diametaphyseal fracture with severe comminution and 2 cm of anterior displacement. Scalloped appearance of the medial margin of the proximal tibial metaphysis likely postsurgical. No other fracture or dislocation.  No aggressive osseous lesion. Soft tissue are unremarkable. No radiopaque foreign body or soft tissue emphysema. IMPRESSION: 1. Right total hip arthroplasty without failure or  complication. 2. Left knee arthroplasty. Periprosthetic distal femoral diametaphyseal fracture with severe comminution. Electronically Signed   By: Kathreen Devoid M.D.   On: 08/06/2021 17:01   DG Knee Complete 4 Views Left  Result Date: 08/06/2021 CLINICAL DATA:  Fall. EXAM: LEFT KNEE - COMPLETE 4+ VIEW; LEFT TIBIA AND FIBULA - 2 VIEW COMPARISON:  None Available. FINDINGS: Left knee: No acute fracture or dislocation. Chronic healed fracture of the proximal left fibula. Advanced left knee osteoarthritis with loss of joint space and bone-on-bone articulation in the lateral tibiofemoral compartments.  Advanced patellofemoral osteoarthritis. Vascular calcifications. Soft tissue swelling about the knee. Left tibia/fibula: No acute fracture or dislocation. Chronic healed fracture of the proximal fibula as well as plate and screw fixation of the distal fibular fracture. Osteopenia. Prominent vascular calcifications. IMPRESSION: 1. No acute fracture or dislocation of the left knee and left tibia/fibula. 2. Chronic healed fracture of the proximal fibula as well as plate and surgical fixation of the distal fibular fracture. Osteopenia. 3.  Advanced knee osteoarthritis. Electronically Signed   By: Keane Police D.O.   On: 08/06/2021 16:57   DG Tibia/Fibula Left  Result Date: 08/06/2021 CLINICAL DATA:  Fall. EXAM: LEFT KNEE - COMPLETE 4+ VIEW; LEFT TIBIA AND FIBULA - 2 VIEW COMPARISON:  None Available. FINDINGS: Left knee: No acute fracture or dislocation. Chronic healed fracture of the proximal left fibula. Advanced left knee osteoarthritis with loss of joint space and bone-on-bone articulation in the lateral tibiofemoral compartments. Advanced patellofemoral osteoarthritis. Vascular calcifications. Soft tissue swelling about the knee. Left tibia/fibula: No acute fracture or dislocation. Chronic healed fracture of the proximal fibula as well as plate and screw fixation of the distal fibular fracture. Osteopenia. Prominent vascular calcifications. IMPRESSION: 1. No acute fracture or dislocation of the left knee and left tibia/fibula. 2. Chronic healed fracture of the proximal fibula as well as plate and surgical fixation of the distal fibular fracture. Osteopenia. 3.  Advanced knee osteoarthritis. Electronically Signed   By: Keane Police D.O.   On: 08/06/2021 16:57   CT HEAD WO CONTRAST (5MM)  Result Date: 08/06/2021 CLINICAL DATA:  Head trauma, moderate to severe.  Fell from bed. EXAM: CT HEAD WITHOUT CONTRAST TECHNIQUE: Contiguous axial images were obtained from the base of the skull through the vertex without  intravenous contrast. RADIATION DOSE REDUCTION: This exam was performed according to the departmental dose-optimization program which includes automated exposure control, adjustment of the mA and/or kV according to patient size and/or use of iterative reconstruction technique. COMPARISON:  11/11/2018 FINDINGS: Brain: Generalized atrophy. Chronic small-vessel ischemic changes of the white matter, progressive since the prior examination. Mild increase in ex vacuo enlargement of the lateral ventricles. Old right occipital infarction, not present on the prior exam. No evidence of acute infarction, mass lesion, hemorrhage or extra-axial collection. Vascular: There is atherosclerotic calcification of the major vessels at the base of the brain. Skull: Negative Sinuses/Orbits: Clear/normal Other: None IMPRESSION: Generalized atrophy. Chronic small-vessel ischemic changes of the white matter. Slight increase in ventricular prominence since the prior exam, favored to represent central atrophy. Normal pressure hydrocephalus not completely excluded. Are there any clinical findings of that process? Old right occipital infarction, not present on the study of September 2020. Electronically Signed   By: Nelson Chimes M.D.   On: 08/06/2021 14:10   DG Shoulder Left  Result Date: 08/06/2021 CLINICAL DATA:  [place] out of bed.  Left shoulder pain. EXAM: LEFT SHOULDER - 2+ VIEW COMPARISON:  Chest radiograph 03/26/2020 FINDINGS: Severe  osteoarthritis with remodeling in the left glenohumeral joint. Evidence for heterotopic ossifications in the left shoulder. Left shoulder is located without acute fracture. No acute abnormality of the left AC joint. Evidence for old left rib fractures. IMPRESSION: 1. Severe degenerative changes in left shoulder. These findings are chronic. No acute bone abnormality. 2. Old left rib fractures. Electronically Signed   By: Markus Daft M.D.   On: 08/06/2021 14:05   DG Femur Min 2 Views Left  Result Date:  08/06/2021 CLINICAL DATA:  [place] out of bed.  Reports left femoral pain. EXAM: LEFT FEMUR 2 VIEWS COMPARISON:  Left knee 09/12/2020 FINDINGS: Left hip arthroplasty is located without a periprosthetic fracture. Severe joint space loss along the lateral knee compartment. Cannot exclude a small knee joint effusion. IMPRESSION: 1. No acute bone abnormality in left femur. Left hip is located without a periprosthetic fracture. 2. Severe osteoarthritis in the left knee particularly in the lateral compartment. Electronically Signed   By: Markus Daft M.D.   On: 08/06/2021 14:01   DG Hip Unilat W or Wo Pelvis 2-3 Views Right  Result Date: 08/06/2021 CLINICAL DATA:  Fall.  Right hip pain EXAM: DG HIP (WITH OR WITHOUT PELVIS) 2-3V RIGHT COMPARISON:  04/05/2007, 05/02/2012 FINDINGS: Prior right total hip arthroplasty. Arthroplasty components are in their expected alignment. No periprosthetic lucency or fracture is identified. The bones are diffusely demineralized. IMPRESSION: 1. No acute fracture or dislocation. 2. Prior right total hip arthroplasty. Electronically Signed   By: Davina Poke D.O.   On: 08/06/2021 13:59    Microbiology: Recent Results (from the past 240 hour(s))  Surgical pcr screen     Status: None   Collection Time: 08/07/21  5:19 PM   Specimen: Nasal Mucosa; Nasal Swab  Result Value Ref Range Status   MRSA, PCR NEGATIVE NEGATIVE Final   Staphylococcus aureus NEGATIVE NEGATIVE Final    Comment: (NOTE) The Xpert SA Assay (FDA approved for NASAL specimens in patients 2 years of age and older), is one component of a comprehensive surveillance program. It is not intended to diagnose infection nor to guide or monitor treatment. Performed at [facility], [place] [address], [place], [place] [address]      Labs: Basic Metabolic Panel: Recent Labs  Lab 08/07/21 0346 08/08/21 0350 08/09/21 0358 08/10/21 0312  NA 143 140 141 140  K 4.8 4.3 4.0 3.9  CL 106 103 105 104   CO2 '30 30 30 29  '$ GLUCOSE 122* 112* 105* 102*  BUN 44* 56* 40* 35*  CREATININE 0.79 0.78 0.80 0.68  CALCIUM 8.7* 8.2* 8.2* 8.2*  MG 2.2  --  2.2  --    Liver Function Tests: Recent Labs  Lab 08/07/21 0346  AST 19  ALT 18  ALKPHOS 62  BILITOT 1.1  PROT 5.6*  ALBUMIN 2.7*   No results for input(s): "LIPASE", "AMYLASE" in the last 168 hours. No results for input(s): "AMMONIA" in the last 168 hours. CBC: Recent Labs  Lab 08/07/21 0346 08/08/21 0350 08/09/21 0358 08/10/21 0312  WBC 9.1 12.0* 11.6* 12.0*  NEUTROABS 6.8  --   --   --   HGB 7.7* 8.3* 8.7* 9.1*  HCT 24.4* 25.5* 26.4* 27.6*  MCV 92.4 90.7 91.0 91.7  PLT 189 178 146* 170   Cardiac Enzymes: No results for input(s): "CKTOTAL", "CKMB", "CKMBINDEX", "TROPONINI" in the last 168 hours. BNP: BNP (last 3 results) No results for input(s): "BNP" in the last 8760 hours.  ProBNP (last 3  results) No results for input(s): "PROBNP" in the last 8760 hours.  CBG: No results for input(s): "GLUCAP" in the last 168 hours.  FURTHER DISCHARGE INSTRUCTIONS:   Get Medicines reviewed and adjusted: Please take all your medications with you for your next visit with your Primary MD   Laboratory/radiological data: Please request your Primary MD to go over all hospital tests and procedure/radiological results at the follow up, please ask your Primary MD to get all Hospital records sent to his/her office.   In some cases, they will be blood work, cultures and biopsy results pending at the time of your discharge. Please request that your primary care M.D. goes through all the records of your hospital data and follows up on these results.   Also Note the following: If you experience worsening of your admission symptoms, develop shortness of breath, life threatening emergency, suicidal or homicidal thoughts you must seek medical attention immediately by calling 911 or calling your MD immediately  if symptoms less severe.   You must  read complete instructions/literature along with all the possible adverse reactions/side effects for all the Medicines you take and that have been prescribed to you. Take any new Medicines after you have completely understood and accpet all the possible adverse reactions/side effects.    Do not drive when taking Pain medications or sleeping medications (Benzodaizepines)   Do not take more than prescribed Pain, Sleep and Anxiety Medications. It is not advisable to combine anxiety,sleep and pain medications without talking with your primary care practitioner   Special Instructions: If you have smoked or chewed Tobacco  in the last 2 yrs please stop smoking, stop any regular Alcohol  and or any Recreational drug use.   Wear Seat belts while driving.   Please note: You were cared for by a hospitalist during your hospital stay. Once you are discharged, your primary care physician will handle any further medical issues. Please note that NO REFILLS for any discharge medications will be authorized once you are discharged, as it is imperative that you return to your primary care physician (or establish a relationship with a primary care physician if you do not have one) for your post hospital discharge needs so that they can reassess your need for medications and monitor your lab values.     Signed:  Florencia Reasons MD, PhD, FACP  [facility] 08/10/2021, 7:25 AM

## 2021-08-10 NOTE — TOC Progression Note (Signed)
Transition of Care [facility]) - Progression Note    Patient Details  Name: [name] MRN: [id number] Date of Birth: August 07, 1922  Transition of Care [facility]) CM/SW Contact  Ross Ludwig, [place] Phone Number: 08/10/2021, 6:06 PM  Clinical Narrative:     CSW received a message from attending physician that patient's advocate [name] [phone] wanted to speak to [place] regarding patient discharging back to [facility].  CSW spoke to Opal Sidles, who stated they are not happy with the care at [facility].  Per Opal Sidles, she has been talking with for the past year explaining her frustrations.  CSW informed her that since patient is LTC at [facility], CSW can not assist with trying to find patient a different facility.  CSW encouraged her to address the concerns she has with the SNF social worker, and administrator they can assist with trying to find a different facility for patient if advocate wants her to go somewhere different.  CSW also provided her with the contact information for the [facility] advocate phone number which is [phone].  Contact information for the [facility] Complaint [phone] was also given to her.  CSW encouraged her to contact both of those resources, to express her concerns with the state.  Opal Sidles asked if it is possible that patient would be able to get rehab at facility under her insurance, CSW informed her that [facility] is unlikely to approve rehab, because patient is bed bound at base line and total assist for all needs.  CSW informed her that [facility] can try to get approval for rehab, but they will most likely be unsuccessful as well.  CSW suggested she talk with SNF to see if they want to try to get approval.  CSW contacted Claiborne Billings at [place] to inform her of the concerns the patient's advocate had about facility.  Per Claiborne Billings, the facility will follow up with patient and her advocate once patient returns to SNF.  CSW to continue to follow patient's  progress throughout discharge planning.  Expected Discharge Plan: [place] Barriers to Discharge: Continued Medical Work up  Expected Discharge Plan and Services Expected Discharge Plan: [place] In-house Referral: Clinical Social Work Discharge Planning Services: NA Post Acute Care Choice: [place]   Expected Discharge Date: 08/10/21               DME Arranged: N/A DME Agency: NA                   Social Determinants of Health (SDOH) Interventions    Readmission Risk Interventions     No data to display

## 2021-08-10 NOTE — Progress Notes (Signed)
PROGRESS NOTE    [name]  NOM:[id number] DOB: [birth year] DOA: 08/06/2021 PCP: System, Provider Not In     Brief Narrative:  88 year old white female history of hypertension, atrial fibrillation, not on anticoagulation due to history of GI bleed, not on rate control agent due to history of bradycardia, currently bedbound status who lives at [facility] skilled nursing facility presents to the ER after a fall out of bed.  Family states that the patient needs a Hoyer lift to get out of bed and into a wheelchair.  Family states that patient needed to use the bathroom today.  She was rolled onto her side to be able to use a bedpan.  She rolled out of bed and onto the floor.  She had pain on the right thigh when she struck the floor.  She was brought to the ER.     X-rays showed a right femoral periprosthetic fracture around the prior right total knee arthroplasty.   Initially patient refused surgery.  She had been given p.o. Vicodin.   After orthopedics is discussed with the patient and family, the family and patient are now reconsidering operative intervention.   [facility] contacted for admission.    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 -Periprosthetic fracture around internal prosthetic right knee joint is an Acute illness/condition that poses a  threat to life or bodily function.  -s/p ORIF on 6/9 -will follow ortho recommendation.  Acute normocytic anemia -Does not appear to have any external signs of bleeding, reported history of GI bleeding from ulcer, stool is brown by report -s/p  1 units PRBC transfusion on 6/8-6/9 night  -cbc stable posttransfusion  Chronic Diastolic heart failure (HCC)/chronic hypoxic respiratory failure, Family report patient was put on oxygen a few months ago  On daily Lasix '40mg'$  at the nursing home Currently does not appear to be volume overloaded, received one-time IV lasix dose after PRBC transfusion Plan to resume lasix on 6/12 as her oral intake start to improve close monitor volume status  Atrial fibrillation (HCC) Stable.  Patient not on anticoagulants according to family.  Not on rate control agent due to history of bradycardia per review of cardiology notes  Essential hypertension Stable without any medication Monitor  PAD/ nonhealing wound on the left heel.   She was evaluated by vascular surgery , per vascular surgery "ABI was reviewed demonstrating severe atherosclerotic disease bilaterally.   The location on the lateral aspect of the heel is typical of pressure induced wounds from lying in bed for an extended period of time.  Patient is nonambulatory, a Hoyer lift at baseline, and is therefore not a revascularization candidate" Family report the wound has healed well with aggressive wound care at SNF  Constipation: had bm after suppository , continue stool softener    Body mass index is 30.96 kg/m..meet obesity criteria  DVT prophylaxis: enoxaparin (LOVENOX) injection 40 mg Start: 08/09/21 0900 SCDs Start: 08/06/21 2351   Code Status:   Code Status: Full Code  Family Communication: Family at bedside  Disposition:    Dispo: The patient is from: SNF /long-term care              Anticipated d/c is to: SNF /long-term care              Anticipated d/c date is:  tomorrow  Antimicrobials:    Anti-infectives (From admission, onward)    Start     Dose/Rate Route Frequency Ordered Stop   08/08/21 2200  ceFAZolin (ANCEF) IVPB 2g/100 mL premix        2 g 200 mL/hr over 30 Minutes Intravenous Every 8 hours 08/08/21 1745 08/09/21 1249   08/08/21 1418  vancomycin (VANCOCIN) powder  Status:  Discontinued          As needed 08/08/21 1418 08/08/21 1732   08/08/21 1115  ceFAZolin (ANCEF) IVPB 2g/100 mL premix        2 g 200 mL/hr over 30 Minutes Intravenous On call to O.R. 08/08/21 1028 08/08/21 1328          Objective: Vitals:   08/09/21 1953 08/09/21 2125 08/10/21 0545 08/10/21 0744  BP:  (!) 126/55 135/66   Pulse:  80 (!) 58   Resp:  17 18   Temp:  98 F (36.7 C) 98.2 F (36.8 C)   TempSrc:  Oral    SpO2: 96%  92% 96%  Weight:      Height:        Intake/Output Summary (Last 24 hours) at 08/10/2021 1157 Last data filed at 08/10/2021 1029 Gross per 24 hour  Intake 960 ml  Output 1200 ml  Net -240 ml   Filed Weights   08/07/21 0000 08/07/21 0407  Weight: 95.1 kg 95.1 kg    Examination:  General exam: alert, awake, communicative,calm, NAD,  Respiratory system: Clear to auscultation. Respiratory effort normal. Cardiovascular system:  IRRR.  Gastrointestinal system: Abdomen is nondistended, soft and nontender.  Normal bowel sounds heard. Central nervous system: Alert and oriented. No focal neurological deficits. Extremities: Right knee post op changes  Skin: A few scattered ecchymosis Psychiatry: Judgement and insight appear normal. Mood & affect appropriate.     Data Reviewed: I have personally reviewed  labs and visualized  imaging studies since the last encounter and formulate the plan        Scheduled Meds:  (feeding supplement) PROSource Plus  30 mL Oral BID BM   bisacodyl  10 mg Rectal Daily   calcium-vitamin D  1 tablet Oral QAC breakfast   cyclobenzaprine  5 mg Oral QHS   enoxaparin  40 mg Subcutaneous Q24H    feeding supplement  237 mL Oral BID BM   [START ON 08/11/2021] furosemide  40 mg Oral Daily   levothyroxine  50 mcg Oral Q0600   melatonin  3 mg Oral QHS   mometasone-formoterol  2 puff Inhalation BID   polyethylene glycol  17 g Oral Daily   senna-docusate  1 tablet Oral BID   Continuous Infusions:     LOS: 4 days      Florencia Reasons, MD PhD FACP [facility]  Available via Epic secure chat 7am-7pm for nonurgent issues Please page for urgent issues To page the attending provider between 7A-7P or the covering provider during after hours 7P-7A, please log into the web site [email or web address] and access using universal  [place] password for that web site. If you do not have the password, please call the hospital operator.    08/10/2021, 11:57 AM

## 2021-08-11 ENCOUNTER — Encounter (HOSPITAL_COMMUNITY): Payer: Self-pay | Admitting: Orthopedic Surgery

## 2021-08-11 DIAGNOSIS — M9711XA Periprosthetic fracture around internal prosthetic right knee joint, initial encounter: Secondary | ICD-10-CM | POA: Diagnosis not present

## 2021-08-11 LAB — TYPE AND SCREEN
ABO/RH(D): AB POS
Antibody Screen: NEGATIVE
Unit division: 0
Unit division: 0
Unit division: 0
Unit division: 0
Unit division: 0

## 2021-08-11 LAB — BPAM RBC
Blood Product Expiration Date: 202306292359
Blood Product Expiration Date: 202307032359
Blood Product Expiration Date: 202307032359
Blood Product Expiration Date: 202307042359
Blood Product Expiration Date: 202307042359
ISSUE DATE / TIME: 202306082256
ISSUE DATE / TIME: 202306091425
ISSUE DATE / TIME: 202306091425
Unit Type and Rh: 6200
Unit Type and Rh: 6200
Unit Type and Rh: 6200
Unit Type and Rh: 6200
Unit Type and Rh: 6200

## 2021-08-11 NOTE — Progress Notes (Signed)
Nurse called report to [facility].  Pt's family at bedside at this time.  AVS given to pt's family prior to discharge.  Awaiting PTAR for transport.

## 2021-08-11 NOTE — Discharge Summary (Signed)
Physician Discharge Summary  [name] WLN:[id number] DOB: [birth year] DOA: 08/06/2021  PCP: System, Provider Not In  Admit date: 08/06/2021 Discharge date: 08/11/2021  Admitted From: [place] home Discharge disposition: Back to [place] nursing home  History of Present Illness / Brief narrative:  Patient is a 88 year old white female history of hypertension, atrial fibrillation, not on anticoagulation due to history of GI bleed, not on rate control agent due to history of bradycardia, bedbound status at baseline who is a long-term resident at [facility] skilled nursing facility. She was sent to the ED on 6/7 after a fall out of bed.  At baseline, she is bedbound, wheelchair-bound, needs a Hoyer lift to get out of bed into a wheelchair.   On 6/7, she was rolled on the bed for a bedpan and per family left unattended.  Her bed did not have guardrails and hence patient fell to the floor and started hard on the right thigh. She was hence brought to the ED.  X-rays showed a right femoral periprosthetic fracture around the prior right total knee arthroplasty. Patient initially patient refused surgery.   But after discussion by orthopedics with the patient and family, patient agreed for surgical fixation.   6/9, patient underwent ORIF of the right femur by Dr. Zachery Dakins.  Subjective:  Seen and examined this morning.  Pleasant elderly Caucasian female.  Propped up in bed.  Alert, awake, cheerful.  Slow to respond.  Her sister/power of attorney was at bedside.  She is concerned about patient's risk of falling again at the facility.  She states that the facility has small beds and does not have guardrails (which she says is per state rule).Marland Kitchen  Hospital Course:  Periprosthetic fracture around internal prosthetic right knee joint -Secondary to fall out of bed in a facility -s/p ORIF on 6/9 -will follow ortho recommendation. -Currently on Flexeril as needed, oxycodone as needed -DVT  prophylaxis per orthopedics-Eliquis 2.5 mg twice daily for a month  Fall out of bed -Patient's family is concerned about patient's risk of falling again at the facility.  She states that the facility has small beds and does not have guardrails.  She has been told that the beds cannot have guardrails per state rule.  I would suggest any kind of commonsense fall precautions measures would be helpful to prevent another fall/fracture.   Acute on mild chronic anemia -Baseline hemoglobin more than 10.  Hemoglobin was at the lowest of 7.7 on 6/8.  Probably because of loss from the fracture of long bone and operative loss.  She was given a monitor PRBC transfusion.  Hemoglobin now stable above 9.  No other evidence of bleeding. Recent Labs    09/21/20 0453 08/07/21 0346 08/08/21 0350 08/09/21 0358 08/10/21 0312  HGB 10.7* 7.7* 8.3* 8.7* 9.1*  MCV 95.4 92.4 90.7 91.0 91.7   Chronic Diastolic heart failure Chronic hypoxic respiratory failure -Continue Lasix 40 mg daily post discharge. -Continue 4 L oxygen nasal cannula which seems to be her baseline requirement.   Atrial fibrillation -Does not seem to be in any AV nodal agent because of history of bradycardia.  Also not on anticoagulants due to falls  Essential hypertension -Blood pressure is stable without any medication   PAD/ nonhealing wound on the left heel.   She was evaluated by vascular surgery in the past. Per vascular surgery, "ABI was reviewed demonstrating severe atherosclerotic disease bilaterally. The location on the lateral aspect of the heel is typical of pressure induced wounds  from lying in bed for an extended period of time.  Patient is nonambulatory, a Hoyer lift at baseline, and is therefore not a revascularization candidate" -Family report the wound has healed well with aggressive wound care at SNF   Constipation -had bm after suppository, continue stool softener   Mobility -Wheelchair/bedbound  Goals of care - -   Code Status: Full Code   Nutritional status:  Body mass index is 30.96 kg/m.      Diet:  Diet Order             Diet general           Diet regular Room service appropriate? Yes; Fluid consistency: Thin  Diet effective now                    Wounds:  - Incision (Closed) 08/08/21 Knee Right (Active)  Date First Assessed/Time First Assessed: 08/08/21 1600   Location: Knee  Location Orientation: Right    Assessments 08/08/2021  4:35 PM 08/11/2021  8:08 AM  Dressing Type -- Compression wrap  Dressing Clean, Dry, Intact Clean, Dry, Intact  Site / Wound Assessment Dressing in place / Unable to assess --     No associated orders.    Discharge Exam:   Vitals:   08/10/21 1927 08/10/21 2157 08/11/21 0548 08/11/21 0830  BP:  (!) 144/89 99/62   Pulse:  70 65   Resp:  17 17   Temp:  98.3 F (36.8 C) 98.2 F (36.8 C)   TempSrc:  Oral Oral   SpO2: 98% 100% 100% 100%  Weight:      Height:        Body mass index is 30.96 kg/m.  General exam: Pleasant elderly Caucasian female.  Propped up in bed.  Not in distress at rest Skin: No rashes, lesions or ulcers. HEENT: Atraumatic, normocephalic, no obvious bleeding Lungs: Clear to auscultation bilaterally CVS: Regular rate and rhythm, no murmur GI/Abd soft, nontender, nondistended, bowel sound present CNS: Alert, awake, oriented to place Psychiatry: Dementia at baseline Extremities: No pedal edema, no calf tenderness  Follow ups:    Follow-up Information     Your primary care provider. Schedule an appointment as soon as possible for a visit .          [place] [facility] .   Specialty: Emergency Medicine Why: If symptoms worsen Contact information: [place] [id number] [place] [phone]        Willaim Sheng, MD Follow up in 2 week(s).   Specialty: Orthopedic Surgery Contact information: [address] [place]  [address] [phone]                 Discharge Instructions:   Discharge Instructions     Call MD for:  difficulty breathing, headache or visual disturbances   Complete by: As directed    Call MD for:  extreme fatigue   Complete by: As directed    Call MD for:  hives   Complete by: As directed    Call MD for:  persistant dizziness or light-headedness   Complete by: As directed    Call MD for:  persistant nausea and vomiting   Complete by: As directed    Call MD for:  severe uncontrolled pain   Complete by: As directed    Call MD for:  temperature >100.4   Complete by: As directed    Diet general  Complete by: As directed    Discharge instructions   Complete by: As directed    General discharge instructions: Follow with Primary MD System, Provider Not In in 7 days  Please request your PCP  to go over your hospital tests, procedures, radiology results at the follow up. Please get your medicines reviewed and adjusted.  Your PCP may decide to repeat certain labs or tests as needed. Do not drive, operate heavy machinery, perform activities at heights, swimming or participation in water activities or provide baby sitting services if your were admitted for syncope or siezures until you have seen by Primary MD or a Neurologist and advised to do so again. [place] Controlled Substance Reporting System database was reviewed. Do not drive, operate heavy machinery, perform activities at heights, swim, participate in water activities or provide baby-sitting services while on medications for pain, sleep and mood until your outpatient physician has reevaluated you and advised to do so again.  You are strongly recommended to comply with the dose, frequency and duration of prescribed medications. Activity: As tolerated with Full fall precautions use walker/cane & assistance as needed Avoid using any recreational substances like cigarette, tobacco, alcohol, or non-prescribed drug. If you  experience worsening of your admission symptoms, develop shortness of breath, life threatening emergency, suicidal or homicidal thoughts you must seek medical attention immediately by calling 911 or calling your MD immediately  if symptoms less severe. You must read complete instructions/literature along with all the possible adverse reactions/side effects for all the medicines you take and that have been prescribed to you. Take any new medicine only after you have completely understood and accepted all the possible adverse reactions/side effects.  Wear Seat belts while driving. You were cared for by a hospitalist during your hospital stay. If you have any questions about your discharge medications or the care you received while you were in the hospital after you are discharged, you can call the unit and ask to speak with the hospitalist or the covering physician. Once you are discharged, your primary care physician will handle any further medical issues. Please note that NO REFILLS for any discharge medications will be authorized once you are discharged, as it is imperative that you return to your primary care physician (or establish a relationship with a primary care physician if you do not have one).   Discharge wound care:   Complete by: As directed    Per orthorecommendation   Discharge wound care:   Complete by: As directed    Increase activity slowly   Complete by: As directed    Increase activity slowly   Complete by: As directed        Discharge Medications:   Allergies as of 08/11/2021       Reactions   Morphine And Related Other (See Comments)   Other Reaction: mild Intolerance, Allergy   Sulfa Antibiotics    Other reaction(s): Other (See Comments) Other Reaction: mild Intolerance, Allergy   Meperidine Hcl Nausea Only   Nsaids    Other reaction(s): Other (See Comments) Bleeding ulcer   Tolmetin Other (See Comments)   Other reaction(s): Other (See Comments) Bleeding ulcer         Medication List     STOP taking these medications    HYDROcodone-acetaminophen 5-325 MG tablet Commonly known as: NORCO/VICODIN       TAKE these medications    acetaminophen 325 MG tablet Commonly known as: TYLENOL Take 2 tablets (650 mg total) by mouth every  6 (six) hours as needed for moderate pain.   apixaban 2.5 MG Tabs tablet Commonly known as: Eliquis Take 1 tablet (2.5 mg total) by mouth 2 (two) times daily.   calcium-vitamin D 500-200 MG-UNIT tablet Commonly known as: OSCAL WITH D Take 1 tablet by mouth daily.   CENTRUM PO Take 1 tablet by mouth 2 (two) times daily.   cyclobenzaprine 5 MG tablet Commonly known as: FLEXERIL Take 5 mg by mouth at bedtime.   docusate sodium 100 MG capsule Commonly known as: COLACE Take 100 mg by mouth daily as needed for mild constipation.   Ensure Take 237 mLs by mouth in the morning, at noon, in the evening, and at bedtime.   feeding supplement (PRO-STAT SUGAR FREE 64) Liqd Take 30 mLs by mouth daily.   FISH OIL PO Take 1 tablet by mouth daily.   furosemide 40 MG tablet Commonly known as: LASIX Take 40 mg by mouth daily.   levothyroxine 50 MCG tablet Commonly known as: SYNTHROID Take 50 mcg by mouth daily.   MiraLax 17 g packet Generic drug: polyethylene glycol Take 17 g by mouth daily.   omeprazole 20 MG capsule Commonly known as: PRILOSEC Take 20 mg by mouth every morning.   oxyCODONE 5 MG immediate release tablet Commonly known as: Oxy IR/ROXICODONE Take 1 tablet (5 mg total) by mouth every 4 (four) hours as needed for severe pain or moderate pain.   Posture Seat Misc Lift chair   potassium chloride 10 MEQ tablet Commonly known as: KLOR-CON M Take 10 mEq by mouth daily.   Symbicort 160-4.5 MCG/ACT inhaler Generic drug: budesonide-formoterol Inhale 2 puffs into the lungs 2 (two) times daily.               Discharge Care Instructions  (From admission, onward)            Start     Ordered   08/11/21 0000  Discharge wound care:        08/11/21 1143   08/10/21 0000  Discharge wound care:       Comments: Per orthorecommendation   08/10/21 0724             The results of significant diagnostics from this hospitalization (including imaging, microbiology, ancillary and laboratory) are listed below for reference.    Procedures and Diagnostic Studies:   CT KNEE RIGHT WO CONTRAST  Result Date: 08/06/2021 CLINICAL DATA:  Periprosthetic fracture suspected. EXAM: CT OF THE RIGHT KNEE WITHOUT CONTRAST TECHNIQUE: Multidetector CT imaging of the right knee was performed according to the standard protocol. Multiplanar CT image reconstructions were also generated. RADIATION DOSE REDUCTION: This exam was performed according to the departmental dose-optimization program which includes automated exposure control, adjustment of the mA and/or kV according to patient size and/or use of iterative reconstruction technique. COMPARISON:  X-ray same day FINDINGS: Bones/Joint/Cartilage Total knee arthroplasty is present in anatomic alignment. There is a severely comminuted fracture of the distal femur extending to the and surrounding the knee arthroplasty. There is 1/2 shaft with anterior and medial displacement of the proximal fracture fragment. Numerous small free fracture fragments are identified. Joint effusion is present, but not well evaluated secondary to streak artifact. Ligaments Suboptimally assessed by CT. Muscles and Tendons There is mild intramuscular edema surrounding the fracture. Soft tissues There is mild subcutaneous edema surrounding the knee. There also some nonspecific subcutaneous calcifications anterior to the patellar tendon. IMPRESSION: 1. Total knee arthroplasty appears in anatomic alignment. 2. Markedly comminuted displaced  fracture of the distal femur extending to in the surrounding the femoral component of the arthroplasty. 3. Joint effusion. Electronically Signed    By: Ronney Asters M.D.   On: 08/06/2021 22:36   CT PELVIS WO CONTRAST  Result Date: 08/06/2021 CLINICAL DATA:  Hip trauma. Fracture suspected. Slid off bed landing on right side. Right hip and left thigh pain. EXAM: CT PELVIS WITHOUT CONTRAST TECHNIQUE: Multidetector CT imaging of the pelvis was performed following the standard protocol without intravenous contrast. RADIATION DOSE REDUCTION: This exam was performed according to the departmental dose-optimization program which includes automated exposure control, adjustment of the mA and/or kV according to patient size and/or use of iterative reconstruction technique. COMPARISON:  Right femur radiographs 08/06/2021; left femur radiographs 08/06/2021; CT pelvis 05/02/2012 FINDINGS: Urinary Tract: Within the limitations of streak artifact, no gross bladder abnormality is seen. Bowel: The visualized bowel is grossly unremarkable. Normal appendix extending posteriorly in inferiorly from the cecum. Vascular/Lymphatic: There again moderate to high-grade atherosclerotic calcifications. There is again mild dilatation of the bilateral common iliac arteries measuring up to 1.6 cm. No lymphadenopathy is seen. Reproductive:  The uterus is present.  No gross adnexal abnormality. Other:  None Musculoskeletal: Status post bilateral total hip arthroplasty. Within the limitation of associated metallic streak artifact, no definite perihardware lucency is seen to indicate hardware failure or loosening. There is diffuse decreased bone mineralization. No definite acute fracture is seen within this limitation. Mild bilateral sacroiliac joint space narrowing and subchondral sclerosis. Mild-to-moderate pubic symphysis joint space narrowing and peripheral osteophytosis. Unchanged mild chronic enthesopathic change at the bilateral iliopsoas tendon insertions on the lesser trochanters. Moderate to severe L4-5 and L5-S1 facet joint arthropathy. There is high-grade fatty infiltration  atrophy of the bilateral gluteus minimus musculature. Moderate atrophy and fatty infiltration of the bilateral gluteus medius musculature. Within the limitations of the streak artifact, there appears to be mildly increased density within the visualized proximal lateral aspect of the vastus lateralis muscle (axial series 5, images 100 48 through 162, possibly an intramuscular hematoma. IMPRESSION: 1. Status post bilateral total hip arthroplasty. 2. No acute fracture is identified. Electronically Signed   By: Yvonne Kendall M.D.   On: 08/06/2021 17:59   CT Cervical Spine Wo Contrast  Result Date: 08/06/2021 CLINICAL DATA:  Neck trauma.  Landed on right side. EXAM: CT CERVICAL SPINE WITHOUT CONTRAST TECHNIQUE: Multidetector CT imaging of the cervical spine was performed without intravenous contrast. Multiplanar CT image reconstructions were also generated. RADIATION DOSE REDUCTION: This exam was performed according to the departmental dose-optimization program which includes automated exposure control, adjustment of the mA and/or kV according to patient size and/or use of iterative reconstruction technique. COMPARISON:  None Available. FINDINGS: Alignment: Anatomic alignment. No static listhesis. 2 mm anterolisthesis of C3 on C4 and C7 on T1. Skull base and vertebrae: No acute fracture. No aggressive lytic or sclerotic osseous lesion. Generalized osteopenia. Soft tissues and spinal canal: Intraspinal soft tissues are not fully imaged on this examination due to poor soft tissue contrast, but there is no gross soft tissue abnormality. No prevertebral fluid or swelling. No visible canal hematoma. Disc levels: Degenerative disease with disc height loss C3-4, C4-5, C5-6 and C6-7. bilateral facet arthropathy at C2-3, C3-4, C4-5, C5-6, C6-7 and C7-T1. Bilateral foraminal stenosis at C3-4. Bilateral foraminal stenosis at C4-5, C5-6, and C6-7. Upper chest: Lung apices are clear. Other: Severe osteoarthritis of the  glenohumeral joints bilaterally. 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 IMPRESSION: 1.  No acute  osseous injury of the cervical spine. 2. Cervical spine spondylosis as described above. Electronically Signed   By: Kathreen Devoid M.D.   On: 08/06/2021 17:11   DG Femur Min 2 Views Right  Result Date: 08/06/2021 CLINICAL DATA:  Status post fall, leg pain EXAM: RIGHT KNEE - COMPLETE 4+ VIEW; RIGHT FEMUR 2 VIEWS COMPARISON:  None Available. FINDINGS: Right total hip arthroplasty without failure or complication. Left knee arthroplasty. Periprosthetic distal femoral diametaphyseal fracture with severe comminution and 2 cm of anterior displacement. Scalloped appearance of the medial margin of the proximal tibial metaphysis likely postsurgical. No other fracture or dislocation.  No aggressive osseous lesion. Soft tissue are unremarkable. No radiopaque foreign body or soft tissue emphysema. IMPRESSION: 1. Right total hip arthroplasty without failure or complication. 2. Left knee arthroplasty. Periprosthetic distal femoral diametaphyseal fracture with severe comminution. Electronically Signed   By: Kathreen Devoid M.D.   On: 08/06/2021 17:01   DG Knee Complete 4 Views Right  Result Date: 08/06/2021 CLINICAL DATA:  Status post fall, leg pain EXAM: RIGHT KNEE - COMPLETE 4+ VIEW; RIGHT FEMUR 2 VIEWS COMPARISON:  None Available. FINDINGS: Right total hip arthroplasty without failure or complication. Left knee arthroplasty. Periprosthetic distal femoral diametaphyseal fracture with severe comminution and 2 cm of anterior displacement. Scalloped appearance of the medial margin of the proximal tibial metaphysis likely postsurgical. No other fracture or dislocation.  No aggressive osseous lesion. Soft tissue are unremarkable. No radiopaque foreign body or soft tissue emphysema. IMPRESSION: 1. Right total hip arthroplasty without failure or complication. 2. Left knee arthroplasty. Periprosthetic distal femoral  diametaphyseal fracture with severe comminution. Electronically Signed   By: Kathreen Devoid M.D.   On: 08/06/2021 17:01   DG Knee Complete 4 Views Left  Result Date: 08/06/2021 CLINICAL DATA:  Fall. EXAM: LEFT KNEE - COMPLETE 4+ VIEW; LEFT TIBIA AND FIBULA - 2 VIEW COMPARISON:  None Available. FINDINGS: Left knee: No acute fracture or dislocation. Chronic healed fracture of the proximal left fibula. Advanced left knee osteoarthritis with loss of joint space and bone-on-bone articulation in the lateral tibiofemoral compartments. Advanced patellofemoral osteoarthritis. Vascular calcifications. Soft tissue swelling about the knee. Left tibia/fibula: No acute fracture or dislocation. Chronic healed fracture of the proximal fibula as well as plate and screw fixation of the distal fibular fracture. Osteopenia. Prominent vascular calcifications. IMPRESSION: 1. No acute fracture or dislocation of the left knee and left tibia/fibula. 2. Chronic healed fracture of the proximal fibula as well as plate and surgical fixation of the distal fibular fracture. Osteopenia. 3.  Advanced knee osteoarthritis. Electronically Signed   By: Keane Police D.O.   On: 08/06/2021 16:57   DG Tibia/Fibula Left  Result Date: 08/06/2021 CLINICAL DATA:  Fall. EXAM: LEFT KNEE - COMPLETE 4+ VIEW; LEFT TIBIA AND FIBULA - 2 VIEW COMPARISON:  None Available. FINDINGS: Left knee: No acute fracture or dislocation. Chronic healed fracture of the proximal left fibula. Advanced left knee osteoarthritis with loss of joint space and bone-on-bone articulation in the lateral tibiofemoral compartments. Advanced patellofemoral osteoarthritis. Vascular calcifications. Soft tissue swelling about the knee. Left tibia/fibula: No acute fracture or dislocation. Chronic healed fracture of the proximal fibula as well as plate and screw fixation of the distal fibular fracture. Osteopenia. Prominent vascular calcifications. IMPRESSION: 1. No acute fracture or dislocation  of the left knee and left tibia/fibula. 2. Chronic healed fracture of the proximal fibula as well as plate and surgical fixation of the distal fibular fracture. Osteopenia. 3.  Advanced knee osteoarthritis.  Electronically Signed   By: Keane Police D.O.   On: 08/06/2021 16:57   CT HEAD WO CONTRAST (5MM)  Result Date: 08/06/2021 CLINICAL DATA:  Head trauma, moderate to severe.  Fell from bed. EXAM: CT HEAD WITHOUT CONTRAST TECHNIQUE: Contiguous axial images were obtained from the base of the skull through the vertex without intravenous contrast. RADIATION DOSE REDUCTION: This exam was performed according to the departmental dose-optimization program which includes automated exposure control, adjustment of the mA and/or kV according to patient size and/or use of iterative reconstruction technique. COMPARISON:  11/11/2018 FINDINGS: Brain: Generalized atrophy. Chronic small-vessel ischemic changes of the white matter, progressive since the prior examination. Mild increase in ex vacuo enlargement of the lateral ventricles. Old right occipital infarction, not present on the prior exam. No evidence of acute infarction, mass lesion, hemorrhage or extra-axial collection. Vascular: There is atherosclerotic calcification of the major vessels at the base of the brain. Skull: Negative Sinuses/Orbits: Clear/normal Other: None IMPRESSION: Generalized atrophy. Chronic small-vessel ischemic changes of the white matter. Slight increase in ventricular prominence since the prior exam, favored to represent central atrophy. Normal pressure hydrocephalus not completely excluded. Are there any clinical findings of that process? Old right occipital infarction, not present on the study of September 2020. Electronically Signed   By: Nelson Chimes M.D.   On: 08/06/2021 14:10   DG Shoulder Left  Result Date: 08/06/2021 CLINICAL DATA:  [place] out of bed.  Left shoulder pain. EXAM: LEFT SHOULDER - 2+ VIEW COMPARISON:  Chest radiograph  03/26/2020 FINDINGS: Severe osteoarthritis with remodeling in the left glenohumeral joint. Evidence for heterotopic ossifications in the left shoulder. Left shoulder is located without acute fracture. No acute abnormality of the left AC joint. Evidence for old left rib fractures. IMPRESSION: 1. Severe degenerative changes in left shoulder. These findings are chronic. No acute bone abnormality. 2. Old left rib fractures. Electronically Signed   By: Markus Daft M.D.   On: 08/06/2021 14:05   DG Femur Min 2 Views Left  Result Date: 08/06/2021 CLINICAL DATA:  [place] out of bed.  Reports left femoral pain. EXAM: LEFT FEMUR 2 VIEWS COMPARISON:  Left knee 09/12/2020 FINDINGS: Left hip arthroplasty is located without a periprosthetic fracture. Severe joint space loss along the lateral knee compartment. Cannot exclude a small knee joint effusion. IMPRESSION: 1. No acute bone abnormality in left femur. Left hip is located without a periprosthetic fracture. 2. Severe osteoarthritis in the left knee particularly in the lateral compartment. Electronically Signed   By: Markus Daft M.D.   On: 08/06/2021 14:01   DG Hip Unilat W or Wo Pelvis 2-3 Views Right  Result Date: 08/06/2021 CLINICAL DATA:  Fall.  Right hip pain EXAM: DG HIP (WITH OR WITHOUT PELVIS) 2-3V RIGHT COMPARISON:  04/05/2007, 05/02/2012 FINDINGS: Prior right total hip arthroplasty. Arthroplasty components are in their expected alignment. No periprosthetic lucency or fracture is identified. The bones are diffusely demineralized. IMPRESSION: 1. No acute fracture or dislocation. 2. Prior right total hip arthroplasty. Electronically Signed   By: Davina Poke D.O.   On: 08/06/2021 13:59     Labs:   Basic Metabolic Panel: Recent Labs  Lab 08/07/21 0346 08/08/21 0350 08/09/21 0358 08/10/21 0312  NA 143 140 141 140  K 4.8 4.3 4.0 3.9  CL 106 103 105 104  CO2 '30 30 30 29  '$ GLUCOSE 122* 112* 105* 102*  BUN 44* 56* 40* 35*  CREATININE 0.79 0.78 0.80 0.68   CALCIUM 8.7* 8.2* 8.2* 8.2*  MG 2.2  --  2.2  --    GFR Estimated Creatinine Clearance: 50.5 mL/min (by C-G formula based on SCr of 0.68 mg/dL). Liver Function Tests: Recent Labs  Lab 08/07/21 0346  AST 19  ALT 18  ALKPHOS 62  BILITOT 1.1  PROT 5.6*  ALBUMIN 2.7*   No results for input(s): "LIPASE", "AMYLASE" in the last 168 hours. No results for input(s): "AMMONIA" in the last 168 hours. Coagulation profile No results for input(s): "INR", "PROTIME" in the last 168 hours.  CBC: Recent Labs  Lab 08/07/21 0346 08/08/21 0350 08/09/21 0358 08/10/21 0312  WBC 9.1 12.0* 11.6* 12.0*  NEUTROABS 6.8  --   --   --   HGB 7.7* 8.3* 8.7* 9.1*  HCT 24.4* 25.5* 26.4* 27.6*  MCV 92.4 90.7 91.0 91.7  PLT 189 178 146* 170   Cardiac Enzymes: No results for input(s): "CKTOTAL", "CKMB", "CKMBINDEX", "TROPONINI" in the last 168 hours. BNP: Invalid input(s): "POCBNP" CBG: No results for input(s): "GLUCAP" in the last 168 hours. D-Dimer No results for input(s): "DDIMER" in the last 72 hours. Hgb A1c No results for input(s): "HGBA1C" in the last 72 hours. Lipid Profile No results for input(s): "CHOL", "HDL", "LDLCALC", "TRIG", "CHOLHDL", "LDLDIRECT" in the last 72 hours. Thyroid function studies No results for input(s): "TSH", "T4TOTAL", "T3FREE", "THYROIDAB" in the last 72 hours.  Invalid input(s): "FREET3" Anemia work up No results for input(s): "VITAMINB12", "FOLATE", "FERRITIN", "TIBC", "IRON", "RETICCTPCT" in the last 72 hours. Microbiology Recent Results (from the past 240 hour(s))  Surgical pcr screen     Status: None   Collection Time: 08/07/21  5:19 PM   Specimen: Nasal Mucosa; Nasal Swab  Result Value Ref Range Status   MRSA, PCR NEGATIVE NEGATIVE Final   Staphylococcus aureus NEGATIVE NEGATIVE Final    Comment: (NOTE) The Xpert SA Assay (FDA approved for NASAL specimens in patients 43 years of age and older), is one component of a comprehensive surveillance  program. It is not intended to diagnose infection nor to guide or monitor treatment. Performed at [facility], [place] [address] [place], [place] [address]     Time coordinating discharge: 35 minutes  Signed: Marlowe Aschoff Ameris Akamine  [facility] 08/11/2021, 11:45 AM

## 2021-08-11 NOTE — Progress Notes (Addendum)
     Subjective:  Patient has done well over the weekend.  Jan, family member, at bedside.  She states that the patient has done well over the weekend.  Has been not having much pain in regards to the right leg as tolerated hygiene and turning, dressing changes of the right leg without any significant discomfort.  She states that the patient seems much more comfortable after surgery.  She is more bothered by her chronic pain in the left arm and left knee.  Objective:   VITALS:   Vitals:   08/10/21 1424 08/10/21 1927 08/10/21 2157 08/11/21 0548  BP: (!) 132/44  (!) 144/89 99/62  Pulse: 72  70 65  Resp: '18  17 17  '$ Temp: 97.6 F (36.4 C)  98.3 F (36.8 C) 98.2 F (36.8 C)  TempSrc: Oral  Oral Oral  SpO2: 100% 98% 100% 100%  Weight:      Height:        Sensation intact distally Dorsiflexion/Plantar flexion intact Incision: dressing C/D/I Compartment soft   Lab Results  Component Value Date   WBC 12.0 (H) 08/10/2021   HGB 9.1 (L) 08/10/2021   HCT 27.6 (L) 08/10/2021   MCV 91.7 08/10/2021   PLT 170 08/10/2021   BMET    Component Value Date/Time   NA 140 08/10/2021 0312   NA 143 08/30/2017 1629   K 3.9 08/10/2021 0312   CL 104 08/10/2021 0312   CO2 29 08/10/2021 0312   GLUCOSE 102 (H) 08/10/2021 0312   BUN 35 (H) 08/10/2021 0312   BUN 32 08/30/2017 1629   CREATININE 0.68 08/10/2021 0312   CREATININE 1.02 (H) 12/30/2015 1138   CALCIUM 8.2 (L) 08/10/2021 0312   GFRNONAA >60 08/10/2021 7048      Xray: Postop x-rays demonstrate internal fixation of progressively distal femur fracture hardware intact without adverse features  Assessment/Plan: 3 Days Post-Op   Principal Problem:   Periprosthetic fracture around internal prosthetic right knee joint Active Problems:   Essential hypertension   Atrial fibrillation (HCC)   Diastolic heart failure (HCC)   Closed fracture of right distal femur (HCC)  Status post ORIF right periprosthetic distal femur fracture  08/08/21  Post op recs: WB: NWB RLE, range of motion as tolerated Abx: ancef x23 hours post op Imaging: PACU xrays Dressing: keep intact until follow up, change PRN if soiled or saturated. DVT prophylaxis: DVT prophylaxis x4 weeks Lovenox or equivalent  Follow up: 2 weeks after surgery for a wound check with Dr. Zachery Dakins at [facility].  Address: [address] [place], [place], [place] [address]  Office Phone: [phone]    Willaim Sheng 08/11/2021, 7:30 AM   Charlies Constable, MD  Contact information:   [phone] 7am-5pm epic message Dr. Zachery Dakins, or call office for patient follow up: [phone] After hours and holidays please check [email or web address] for group call information for [facility]

## 2021-08-11 NOTE — TOC Transition Note (Signed)
Transition of Care [facility]) - CM/SW Discharge Note   Patient Details  Name: [name] MRN: [id number] Date of Birth: April 01, 1922  [facility] CM/SW Contact:  Lennart Pall, LCSW Phone Number: 08/11/2021, 12:16 PM   Clinical Narrative:    Pt medically cleared for dc today back to [facility] LTC bed.  Pt and friend, Jan, aware and agreeable.  PTAR called at 12:07pm.  RN to call report to [phone].  No further TOC needs.   Final next level of care: Long Term Nursing Home Barriers to Discharge: Barriers Resolved   Patient Goals and CMS Choice Patient states their goals for this hospitalization and ongoing recovery are:: Return to [facility]      Discharge Placement   Existing PASRR number confirmed : 08/09/21          Patient chooses bed at: [name] Patient to be transferred to facility by: [place] Name of family member notified: friend, Jan who will alert pt's nephew Patient and family notified of of transfer: 08/11/21  Discharge Plan and Services In-house Referral: Clinical Social Work Discharge Planning Services: NA Post Acute Care Choice: [place]          DME Arranged: N/A DME Agency: NA                  Social Determinants of Health (SDOH) Interventions     Readmission Risk Interventions    08/11/2021   12:14 PM  Readmission Risk Prevention Plan  Transportation Screening Complete  PCP or Specialist Appt within 3-5 Days Complete  HRI or [place] Complete  Social Work Consult for [place] Planning/Counseling Complete  Palliative Care Screening Not Applicable  Medication Review Press photographer) Complete

## 2021-08-12 NOTE — Progress Notes (Signed)
[name], [name] ([id number]) Visit Report for 07/23/2021 Arrival Information Details Patient Name: Date of Service: [name], [name] 07/23/2021 2:30 PM Medical Record Number: [id number] Patient Account Number: [id number] Date of Birth/Sex: Treating RN: 06/28/1922 (88 y.o. [name], [name] Primary Care Christpoher [name]: Aretta Nip Other Clinician: Referring [name]: Treating [name]/Extender: [name] in Treatment: 28 Visit Information History Since Last Visit Added or deleted any medications: No Patient Arrived: Wheel Chair Any new allergies or adverse reactions: No Arrival Time: 14:30 Had a fall or experienced change in No Accompanied By: friend activities of daily living that may affect Transfer Assistance: Manual risk of falls: Patient Identification Verified: Yes Signs or symptoms of abuse/neglect since last visito No Secondary Verification Process Completed: Yes Hospitalized since last visit: No Patient Requires Transmission-Based Precautions: No Implantable device outside of the clinic excluding No Patient Has Alerts: Yes cellular tissue based products placed in the center Patient Alerts: Bilat ABI's NonComp since last visit: BP left arm ONLY Has Dressing in Place as Prescribed: Yes Pain Present Now: No Electronic Signature(s) Signed: 08/12/2021 8:46:54 AM By: [name] Entered By: [name] on 07/23/2021 14:31:20 -------------------------------------------------------------------------------- Clinic Level of Care Assessment Details Patient Name: Date of Service: [name], [name] 07/23/2021 2:30 PM Medical Record Number: [id number] Patient Account Number: [id number] Date of Birth/Sex: Treating RN: 1922-10-02 (88 y.o. [name], [name] Primary Care [name]: Aretta Nip Other Clinician: Referring [name]: Treating [name]/Extender: [name] in Treatment: 28 Clinic  Level of Care Assessment Items TOOL 4 Quantity Score X- 1 0 Use when only an [name] is performed on FOLLOW-UP visit ASSESSMENTS - Nursing Assessment / Reassessment X- 1 10 Reassessment of Co-morbidities (includes updates in patient status) X- 1 5 Reassessment of Adherence to Treatment Plan ASSESSMENTS - Wound and Skin A ssessment / Reassessment X - Simple Wound Assessment / Reassessment - one wound 1 5 '[]'$  - 0 Complex Wound Assessment / Reassessment - multiple wounds '[]'$  - 0 Dermatologic / Skin Assessment (not related to wound area) ASSESSMENTS - Focused Assessment X- 1 5 Circumferential Edema Measurements - multi extremities '[]'$  - 0 Nutritional Assessment / Counseling / Intervention '[]'$  - 0 Lower Extremity Assessment (monofilament, tuning fork, pulses) '[]'$  - 0 Peripheral Arterial Disease Assessment (using hand held doppler) ASSESSMENTS - Ostomy and/or Continence Assessment and Care '[]'$  - 0 Incontinence Assessment and Management '[]'$  - 0 Ostomy Care Assessment and Management (repouching, etc.) PROCESS - Coordination of Care X - Simple Patient / Family Education for ongoing care 1 15 '[]'$  - 0 Complex (extensive) Patient / Family Education for ongoing care X- 1 10 Staff obtains Programmer, systems, Records, T Results / Process Orders est '[]'$  - 0 Staff telephones HHA, Nursing Homes / Clarify orders / etc '[]'$  - 0 Routine Transfer to another Facility (non-emergent condition) '[]'$  - 0 Routine Hospital Admission (non-emergent condition) '[]'$  - 0 New Admissions / Biomedical engineer / Ordering NPWT Apligraf, etc. , '[]'$  - 0 Emergency Hospital Admission (emergent condition) X- 1 10 Simple Discharge Coordination '[]'$  - 0 Complex (extensive) Discharge Coordination PROCESS - Special Needs '[]'$  - 0 Pediatric / Minor Patient Management '[]'$  - 0 Isolation Patient Management '[]'$  - 0 Hearing / Language / Visual special needs '[]'$  - 0 Assessment of Community assistance (transportation, D/C planning, etc.) '[]'$   - 0 Additional assistance / Altered mentation '[]'$  - 0 Support Surface(s) Assessment (bed, cushion, seat, etc.) INTERVENTIONS - Wound Cleansing / Measurement X - Simple Wound Cleansing -  one wound 1 5 '[]'$  - 0 Complex Wound Cleansing - multiple wounds X- 1 5 Wound Imaging (photographs - any number of wounds) '[]'$  - 0 Wound Tracing (instead of photographs) X- 1 5 Simple Wound Measurement - one wound '[]'$  - 0 Complex Wound Measurement - multiple wounds INTERVENTIONS - Wound Dressings X - Small Wound Dressing one or multiple wounds 1 10 '[]'$  - 0 Medium Wound Dressing one or multiple wounds '[]'$  - 0 Large Wound Dressing one or multiple wounds X- 1 5 Application of Medications - topical '[]'$  - 0 Application of Medications - injection INTERVENTIONS - Miscellaneous '[]'$  - 0 External ear exam '[]'$  - 0 Specimen Collection (cultures, biopsies, blood, body fluids, etc.) '[]'$  - 0 Specimen(s) / Culture(s) sent or taken to Lab for analysis '[]'$  - 0 Patient Transfer (multiple staff / Civil Service fast streamer / Similar devices) '[]'$  - 0 Simple Staple / Suture removal (25 or less) '[]'$  - 0 Complex Staple / Suture removal (26 or more) '[]'$  - 0 Hypo / Hyperglycemic Management (close monitor of Blood Glucose) '[]'$  - 0 Ankle / Brachial Index (ABI) - do not check if billed separately X- 1 5 Vital Signs Has the patient been seen at the hospital within the last three years: Yes Total Score: 95 Level Of Care: New/Established - Level 3 Electronic Signature(s) Signed: 07/23/2021 3:02:16 PM By: Rhae Hammock RN Entered By: Rhae Hammock on 07/23/2021 14:56:51 -------------------------------------------------------------------------------- Encounter Discharge Information Details Patient Name: Date of Service: [name], [name] Sarna R. 07/23/2021 2:30 PM Medical Record Number: [id number] Patient Account Number: [id number] Date of Birth/Sex: Treating RN: 1922-11-28 (88 y.o. [name], [name] Primary Care Cary Wilford: Aretta Nip Other Clinician: Referring Charo Philipp: Treating Aspen Lawrance/Extender: Luella Cook, Flonnie Overman in Treatment: 28 Encounter Discharge Information Items Discharge Condition: Stable Ambulatory Status: Wheelchair Discharge Destination: Home Transportation: Private Auto Accompanied By: family Schedule Follow-up Appointment: Yes Clinical Summary of Care: Patient Declined Electronic Signature(s) Signed: 07/23/2021 3:02:16 PM By: Rhae Hammock RN Entered By: Rhae Hammock on 07/23/2021 14:57:30 -------------------------------------------------------------------------------- Lower Extremity Assessment Details Patient Name: Date of Service: [name] Sink R. 07/23/2021 2:30 PM Medical Record Number: [id number] Patient Account Number: [id number] Date of Birth/Sex: Treating RN: [birth year] (88 y.o. [name], [name] Primary Care Angelicia Lessner: Aretta Nip Other Clinician: Referring Gertrude Bucks: Treating Rena Sweeden/Extender: Otis Brace Weeks in Treatment: 28 Edema Assessment Assessed: [Left: No] [Right: No] Edema: [Left: N] [Right: o] Calf Left: Right: Point of Measurement: 31 cm From Medial Instep 41 cm Ankle Left: Right: Point of Measurement: 9 cm From Medial Instep 25.5 cm Electronic Signature(s) Signed: 07/23/2021 3:02:16 PM By: Rhae Hammock RN Signed: 08/12/2021 8:46:54 AM By: [name] Entered By: [name] on 07/23/2021 14:44:29 -------------------------------------------------------------------------------- Multi-Disciplinary Care Plan Details Patient Name: Date of Service: [name], [name] Sarna R. 07/23/2021 2:30 PM Medical Record Number: [id number] Patient Account Number: [id number] Date of Birth/Sex: Treating RN: 1923/02/24 (88 y.o. [name], [name] Primary Care Rahim Astorga: Aretta Nip Other Clinician: Referring Koni Kannan: Treating Javari Bufkin/Extender: [name] in  Treatment: Fidelis reviewed with physician Active Inactive Electronic Signature(s) Signed: 07/23/2021 3:02:16 PM By: Rhae Hammock RN Entered By: Rhae Hammock on 07/23/2021 14:51:58 -------------------------------------------------------------------------------- Pain Assessment Details Patient Name: Date of Service: [name] 07/23/2021 2:30 PM Medical Record Number: [id number] Patient Account Number: [id number] Date of Birth/Sex: Treating RN: 11-22-1922 (88 y.o. Benjaman Lobe Primary Care Mizael Sagar: Aretta Nip Other Clinician: Referring Jesselle Laflamme: Treating Velencia Lenart/Extender: Melburn Hake,  Elta Guadeloupe, Ebro Weeks in Treatment: 28 Active Problems Location of Pain Severity and Description of Pain Patient Has Paino No Site Locations Pain Management and Medication Current Pain Management: Electronic Signature(s) Signed: 07/23/2021 3:02:16 PM By: Rhae Hammock RN Signed: 08/12/2021 8:46:54 AM By: [name] Entered By: [name] on 07/23/2021 14:35:05 -------------------------------------------------------------------------------- Patient/Caregiver Education Details Patient Name: Date of Service: [name] 5/24/2023andnbsp2:30 PM Medical Record Number: [id number] Patient Account Number: [id number] Date of Birth/Gender: Treating RN: 01-Mar-1923 (88 y.o. [name], [name] Primary Care Physician: Aretta Nip Other Clinician: Referring Physician: Treating Physician/Extender: [name] in Treatment: 28 Education Assessment Education Provided To: Patient Education Topics Provided Wound/Skin Impairment: Methods: Explain/Verbal Responses: Reinforcements needed, State content correctly [employer]) Signed: 07/23/2021 3:02:16 PM By: Rhae Hammock RN Entered By: Rhae Hammock on 07/23/2021  14:45:12 -------------------------------------------------------------------------------- Wound Assessment Details Patient Name: Date of Service: [name] Sink R. 07/23/2021 2:30 PM Medical Record Number: [id number] Patient Account Number: [id number] Date of Birth/Sex: Treating RN: 1922/10/23 (88 y.o. [name], [name] Primary Care Hershy Flenner: Aretta Nip Other Clinician: Referring Anderson Middlebrooks: Treating Onetha Gaffey/Extender: Otis Brace Weeks in Treatment: 28 Wound Status Wound Number: 1 Primary Pressure Ulcer Etiology: Wound Location: Left Calcaneus Wound Healed - Epithelialized Wounding Event: Pressure Injury Status: Date Acquired: 09/26/2020 Comorbid Cataracts, Chronic Obstructive Pulmonary Disease (COPD), Weeks Of Treatment: 28 History: Arrhythmia, Hypertension, Osteoarthritis, Received Radiation Clustered Wound: No Photos Wound Measurements Length: (cm) Width: (cm) Depth: (cm) Area: (cm) Volume: (cm) 0 % Reduction in Area: 100% 0 % Reduction in Volume: 100% 0 Epithelialization: Medium (34-66%) 0 Tunneling: No 0 Undermining: No Wound Description Classification: Unstageable/Unclassified Wound Margin: Distinct, outline attached Exudate Amount: Medium Exudate Type: Serosanguineous Exudate Color: red, brown Foul Odor After Cleansing: No Slough/Fibrino Yes Wound Bed Granulation Amount: None Present (0%) Exposed Structure Necrotic Amount: Large (67-100%) Fascia Exposed: No Fat Layer (Subcutaneous Tissue) Exposed: Yes Tendon Exposed: No Muscle Exposed: No Joint Exposed: No Bone Exposed: No Treatment Notes Wound #1 (Calcaneus) Wound Laterality: Left Cleanser Peri-Wound Care Topical Primary Dressing Secondary Dressing Secured With Compression Wrap Compression Stockings Add-Ons Electronic Signature(s) Signed: 07/23/2021 3:02:16 PM By: Rhae Hammock RN Entered By: Rhae Hammock on 07/23/2021  14:52:09 -------------------------------------------------------------------------------- [place] Details Patient Name: Date of Service: [name], [name] Sarna R. 07/23/2021 2:30 PM Medical Record Number: [id number] Patient Account Number: [id number] Date of Birth/Sex: Treating RN: 02-04-1923 (88 y.o. [name], [name] Primary Care Kamie Korber: Aretta Nip Other Clinician: Referring Chaquana Nichols: Treating Makensey Rego/Extender: Otis Brace Weeks in Treatment: 28 Vital Signs Time Taken: 14:34 Temperature (F): 98.6 Pulse (bpm): 76 Respiratory Rate (breaths/min): 18 Blood Pressure (mmHg): 150/66 Reference Range: 80 - 120 mg / dl Electronic Signature(s) Signed: 08/12/2021 8:46:54 AM By: [name] Entered By: [name] on 07/23/2021 14:34:59

## 2021-08-14 DIAGNOSIS — Z9181 History of falling: Secondary | ICD-10-CM | POA: Diagnosis not present

## 2021-08-14 DIAGNOSIS — M25562 Pain in left knee: Secondary | ICD-10-CM | POA: Diagnosis not present

## 2021-08-14 DIAGNOSIS — M6281 Muscle weakness (generalized): Secondary | ICD-10-CM | POA: Diagnosis not present

## 2021-08-14 DIAGNOSIS — R2689 Other abnormalities of gait and mobility: Secondary | ICD-10-CM | POA: Diagnosis not present

## 2021-08-26 DIAGNOSIS — M978XXA Periprosthetic fracture around other internal prosthetic joint, initial encounter: Secondary | ICD-10-CM | POA: Diagnosis not present

## 2021-08-26 DIAGNOSIS — M25561 Pain in right knee: Secondary | ICD-10-CM | POA: Diagnosis not present

## 2021-09-12 DIAGNOSIS — R52 Pain, unspecified: Secondary | ICD-10-CM | POA: Diagnosis not present

## 2021-09-12 DIAGNOSIS — R0602 Shortness of breath: Secondary | ICD-10-CM | POA: Diagnosis not present

## 2021-09-12 DIAGNOSIS — K5900 Constipation, unspecified: Secondary | ICD-10-CM | POA: Diagnosis not present

## 2021-09-15 DIAGNOSIS — I5022 Chronic systolic (congestive) heart failure: Secondary | ICD-10-CM | POA: Diagnosis not present

## 2021-09-15 DIAGNOSIS — E559 Vitamin D deficiency, unspecified: Secondary | ICD-10-CM | POA: Diagnosis not present

## 2021-09-15 DIAGNOSIS — R053 Chronic cough: Secondary | ICD-10-CM | POA: Diagnosis not present

## 2021-09-15 DIAGNOSIS — Z7689 Persons encountering health services in other specified circumstances: Secondary | ICD-10-CM | POA: Diagnosis not present

## 2021-09-15 DIAGNOSIS — E119 Type 2 diabetes mellitus without complications: Secondary | ICD-10-CM | POA: Diagnosis not present

## 2021-09-15 DIAGNOSIS — J189 Pneumonia, unspecified organism: Secondary | ICD-10-CM | POA: Diagnosis not present

## 2021-09-19 DIAGNOSIS — I10 Essential (primary) hypertension: Secondary | ICD-10-CM | POA: Diagnosis not present

## 2021-09-19 DIAGNOSIS — L89610 Pressure ulcer of right heel, unstageable: Secondary | ICD-10-CM | POA: Diagnosis not present

## 2021-09-19 DIAGNOSIS — E039 Hypothyroidism, unspecified: Secondary | ICD-10-CM | POA: Diagnosis not present

## 2021-09-19 DIAGNOSIS — E440 Moderate protein-calorie malnutrition: Secondary | ICD-10-CM | POA: Diagnosis not present

## 2021-09-23 DIAGNOSIS — M25561 Pain in right knee: Secondary | ICD-10-CM | POA: Diagnosis not present

## 2021-09-26 DIAGNOSIS — E440 Moderate protein-calorie malnutrition: Secondary | ICD-10-CM | POA: Diagnosis not present

## 2021-09-26 DIAGNOSIS — I10 Essential (primary) hypertension: Secondary | ICD-10-CM | POA: Diagnosis not present

## 2021-09-26 DIAGNOSIS — L89610 Pressure ulcer of right heel, unstageable: Secondary | ICD-10-CM | POA: Diagnosis not present

## 2021-09-26 DIAGNOSIS — E039 Hypothyroidism, unspecified: Secondary | ICD-10-CM | POA: Diagnosis not present

## 2021-09-30 DIAGNOSIS — E039 Hypothyroidism, unspecified: Secondary | ICD-10-CM | POA: Diagnosis not present

## 2021-09-30 DIAGNOSIS — E440 Moderate protein-calorie malnutrition: Secondary | ICD-10-CM | POA: Diagnosis not present

## 2021-09-30 DIAGNOSIS — L89610 Pressure ulcer of right heel, unstageable: Secondary | ICD-10-CM | POA: Diagnosis not present

## 2021-09-30 DIAGNOSIS — I10 Essential (primary) hypertension: Secondary | ICD-10-CM | POA: Diagnosis not present

## 2021-10-03 DIAGNOSIS — Z7689 Persons encountering health services in other specified circumstances: Secondary | ICD-10-CM | POA: Diagnosis not present

## 2021-10-03 DIAGNOSIS — M6281 Muscle weakness (generalized): Secondary | ICD-10-CM | POA: Diagnosis not present

## 2021-10-03 DIAGNOSIS — R2689 Other abnormalities of gait and mobility: Secondary | ICD-10-CM | POA: Diagnosis not present

## 2021-10-03 DIAGNOSIS — Z9181 History of falling: Secondary | ICD-10-CM | POA: Diagnosis not present

## 2021-10-09 DIAGNOSIS — E440 Moderate protein-calorie malnutrition: Secondary | ICD-10-CM | POA: Diagnosis not present

## 2021-10-09 DIAGNOSIS — L89610 Pressure ulcer of right heel, unstageable: Secondary | ICD-10-CM | POA: Diagnosis not present

## 2021-10-09 DIAGNOSIS — E039 Hypothyroidism, unspecified: Secondary | ICD-10-CM | POA: Diagnosis not present

## 2021-10-09 DIAGNOSIS — I10 Essential (primary) hypertension: Secondary | ICD-10-CM | POA: Diagnosis not present

## 2021-10-15 ENCOUNTER — Telehealth: Payer: Self-pay | Admitting: *Deleted

## 2021-10-15 NOTE — Patient Outreach (Signed)
  Care Coordination   Initial Visit Note   10/15/2021 Name: [name] MRN: [id number] DOB: [birth year]  KEITH CANCIO is a 88 y.o. year old female who sees System, Provider Not In for primary care. I spoke with  [name] by phone today  What matters to the patients health and wellness today?  Resides at [facility] SNF/longterm care. No Care Coordination needs per pt's rep- "Jan". Provider at [facility] provides all care to their satisfaction.   Goals Addressed   None     SDOH assessments and interventions completed:  Yes     Care Coordination Interventions Activated:  No  Care Coordination Interventions:  No, not indicated   Follow up plan: No further intervention required.   Encounter Outcome:  Pt. Visit Completed  Eduard Clos MSW, LCSW Licensed Clinical Social Worker      [phone]

## 2021-10-17 DIAGNOSIS — L89610 Pressure ulcer of right heel, unstageable: Secondary | ICD-10-CM | POA: Diagnosis not present

## 2021-10-17 DIAGNOSIS — E039 Hypothyroidism, unspecified: Secondary | ICD-10-CM | POA: Diagnosis not present

## 2021-10-17 DIAGNOSIS — I10 Essential (primary) hypertension: Secondary | ICD-10-CM | POA: Diagnosis not present

## 2021-10-17 DIAGNOSIS — E440 Moderate protein-calorie malnutrition: Secondary | ICD-10-CM | POA: Diagnosis not present

## 2021-10-21 DIAGNOSIS — L89610 Pressure ulcer of right heel, unstageable: Secondary | ICD-10-CM | POA: Diagnosis not present

## 2021-10-29 DIAGNOSIS — L89610 Pressure ulcer of right heel, unstageable: Secondary | ICD-10-CM | POA: Diagnosis not present

## 2021-10-29 DIAGNOSIS — E440 Moderate protein-calorie malnutrition: Secondary | ICD-10-CM | POA: Diagnosis not present

## 2021-10-29 DIAGNOSIS — E039 Hypothyroidism, unspecified: Secondary | ICD-10-CM | POA: Diagnosis not present

## 2021-10-29 DIAGNOSIS — I10 Essential (primary) hypertension: Secondary | ICD-10-CM | POA: Diagnosis not present

## 2021-11-06 DIAGNOSIS — M25562 Pain in left knee: Secondary | ICD-10-CM | POA: Diagnosis not present

## 2021-11-06 DIAGNOSIS — M25561 Pain in right knee: Secondary | ICD-10-CM | POA: Diagnosis not present

## 2021-11-06 DIAGNOSIS — M898X5 Other specified disorders of bone, thigh: Secondary | ICD-10-CM | POA: Diagnosis not present

## 2021-11-07 DIAGNOSIS — K5900 Constipation, unspecified: Secondary | ICD-10-CM | POA: Diagnosis not present

## 2021-11-07 DIAGNOSIS — R52 Pain, unspecified: Secondary | ICD-10-CM | POA: Diagnosis not present

## 2021-11-07 DIAGNOSIS — E039 Hypothyroidism, unspecified: Secondary | ICD-10-CM | POA: Diagnosis not present

## 2021-11-07 DIAGNOSIS — Z6832 Body mass index (BMI) 32.0-32.9, adult: Secondary | ICD-10-CM | POA: Diagnosis not present

## 2021-11-07 DIAGNOSIS — K219 Gastro-esophageal reflux disease without esophagitis: Secondary | ICD-10-CM | POA: Diagnosis not present

## 2021-11-07 DIAGNOSIS — J449 Chronic obstructive pulmonary disease, unspecified: Secondary | ICD-10-CM | POA: Diagnosis not present

## 2021-11-12 DIAGNOSIS — E0590 Thyrotoxicosis, unspecified without thyrotoxic crisis or storm: Secondary | ICD-10-CM | POA: Diagnosis not present

## 2021-11-13 DIAGNOSIS — E039 Hypothyroidism, unspecified: Secondary | ICD-10-CM | POA: Diagnosis not present

## 2021-11-21 DIAGNOSIS — Z8679 Personal history of other diseases of the circulatory system: Secondary | ICD-10-CM | POA: Diagnosis not present

## 2021-11-21 DIAGNOSIS — J449 Chronic obstructive pulmonary disease, unspecified: Secondary | ICD-10-CM | POA: Diagnosis not present

## 2021-11-21 DIAGNOSIS — J984 Other disorders of lung: Secondary | ICD-10-CM | POA: Diagnosis not present

## 2021-11-21 DIAGNOSIS — R0602 Shortness of breath: Secondary | ICD-10-CM | POA: Diagnosis not present

## 2021-11-24 DIAGNOSIS — I509 Heart failure, unspecified: Secondary | ICD-10-CM | POA: Diagnosis not present

## 2021-11-24 DIAGNOSIS — J189 Pneumonia, unspecified organism: Secondary | ICD-10-CM | POA: Diagnosis not present

## 2021-11-26 DIAGNOSIS — J189 Pneumonia, unspecified organism: Secondary | ICD-10-CM | POA: Diagnosis not present

## 2021-12-30 DIAGNOSIS — M6281 Muscle weakness (generalized): Secondary | ICD-10-CM | POA: Diagnosis not present

## 2021-12-30 DIAGNOSIS — S41112A Laceration without foreign body of left upper arm, initial encounter: Secondary | ICD-10-CM | POA: Diagnosis not present

## 2021-12-30 DIAGNOSIS — M79661 Pain in right lower leg: Secondary | ICD-10-CM | POA: Diagnosis not present

## 2022-01-02 DIAGNOSIS — R0602 Shortness of breath: Secondary | ICD-10-CM | POA: Diagnosis not present

## 2022-01-02 DIAGNOSIS — J811 Chronic pulmonary edema: Secondary | ICD-10-CM | POA: Diagnosis not present

## 2022-01-02 DIAGNOSIS — J449 Chronic obstructive pulmonary disease, unspecified: Secondary | ICD-10-CM | POA: Diagnosis not present

## 2022-01-02 DIAGNOSIS — Z8679 Personal history of other diseases of the circulatory system: Secondary | ICD-10-CM | POA: Diagnosis not present

## 2022-01-02 DIAGNOSIS — I517 Cardiomegaly: Secondary | ICD-10-CM | POA: Diagnosis not present

## 2022-01-05 DIAGNOSIS — I10 Essential (primary) hypertension: Secondary | ICD-10-CM | POA: Diagnosis not present

## 2022-01-05 DIAGNOSIS — I5022 Chronic systolic (congestive) heart failure: Secondary | ICD-10-CM | POA: Diagnosis not present

## 2022-01-05 DIAGNOSIS — D649 Anemia, unspecified: Secondary | ICD-10-CM | POA: Diagnosis not present

## 2022-01-05 DIAGNOSIS — I509 Heart failure, unspecified: Secondary | ICD-10-CM | POA: Diagnosis not present

## 2022-01-05 DIAGNOSIS — N182 Chronic kidney disease, stage 2 (mild): Secondary | ICD-10-CM | POA: Diagnosis not present

## 2022-03-06 DIAGNOSIS — L89610 Pressure ulcer of right heel, unstageable: Secondary | ICD-10-CM | POA: Diagnosis not present

## 2022-03-06 DIAGNOSIS — E039 Hypothyroidism, unspecified: Secondary | ICD-10-CM | POA: Diagnosis not present

## 2022-03-06 DIAGNOSIS — I10 Essential (primary) hypertension: Secondary | ICD-10-CM | POA: Diagnosis not present

## 2022-03-06 DIAGNOSIS — E440 Moderate protein-calorie malnutrition: Secondary | ICD-10-CM | POA: Diagnosis not present

## 2022-03-10 DIAGNOSIS — R410 Disorientation, unspecified: Secondary | ICD-10-CM | POA: Diagnosis not present

## 2022-03-11 DIAGNOSIS — I509 Heart failure, unspecified: Secondary | ICD-10-CM | POA: Diagnosis not present

## 2022-03-11 DIAGNOSIS — L89610 Pressure ulcer of right heel, unstageable: Secondary | ICD-10-CM | POA: Diagnosis not present

## 2022-03-11 DIAGNOSIS — E440 Moderate protein-calorie malnutrition: Secondary | ICD-10-CM | POA: Diagnosis not present

## 2022-03-11 DIAGNOSIS — E039 Hypothyroidism, unspecified: Secondary | ICD-10-CM | POA: Diagnosis not present

## 2022-03-11 DIAGNOSIS — I10 Essential (primary) hypertension: Secondary | ICD-10-CM | POA: Diagnosis not present

## 2022-03-11 DIAGNOSIS — N390 Urinary tract infection, site not specified: Secondary | ICD-10-CM | POA: Diagnosis not present

## 2022-03-12 DIAGNOSIS — Z7689 Persons encountering health services in other specified circumstances: Secondary | ICD-10-CM | POA: Diagnosis not present

## 2022-03-12 DIAGNOSIS — N182 Chronic kidney disease, stage 2 (mild): Secondary | ICD-10-CM | POA: Diagnosis not present

## 2022-03-12 DIAGNOSIS — E860 Dehydration: Secondary | ICD-10-CM | POA: Diagnosis not present

## 2022-03-12 DIAGNOSIS — N390 Urinary tract infection, site not specified: Secondary | ICD-10-CM | POA: Diagnosis not present

## 2022-03-17 DIAGNOSIS — S51812A Laceration without foreign body of left forearm, initial encounter: Secondary | ICD-10-CM | POA: Diagnosis not present

## 2022-03-17 DIAGNOSIS — L89610 Pressure ulcer of right heel, unstageable: Secondary | ICD-10-CM | POA: Diagnosis not present

## 2022-03-17 DIAGNOSIS — E440 Moderate protein-calorie malnutrition: Secondary | ICD-10-CM | POA: Diagnosis not present

## 2022-03-17 DIAGNOSIS — E039 Hypothyroidism, unspecified: Secondary | ICD-10-CM | POA: Diagnosis not present

## 2022-03-25 DIAGNOSIS — E440 Moderate protein-calorie malnutrition: Secondary | ICD-10-CM | POA: Diagnosis not present

## 2022-03-25 DIAGNOSIS — S51812A Laceration without foreign body of left forearm, initial encounter: Secondary | ICD-10-CM | POA: Diagnosis not present

## 2022-03-25 DIAGNOSIS — E039 Hypothyroidism, unspecified: Secondary | ICD-10-CM | POA: Diagnosis not present

## 2022-03-25 DIAGNOSIS — L89610 Pressure ulcer of right heel, unstageable: Secondary | ICD-10-CM | POA: Diagnosis not present

## 2022-03-31 DIAGNOSIS — E440 Moderate protein-calorie malnutrition: Secondary | ICD-10-CM | POA: Diagnosis not present

## 2022-03-31 DIAGNOSIS — L89610 Pressure ulcer of right heel, unstageable: Secondary | ICD-10-CM | POA: Diagnosis not present

## 2022-03-31 DIAGNOSIS — E039 Hypothyroidism, unspecified: Secondary | ICD-10-CM | POA: Diagnosis not present

## 2022-03-31 DIAGNOSIS — S51812A Laceration without foreign body of left forearm, initial encounter: Secondary | ICD-10-CM | POA: Diagnosis not present

## 2022-04-08 DIAGNOSIS — I517 Cardiomegaly: Secondary | ICD-10-CM | POA: Diagnosis not present

## 2022-04-08 DIAGNOSIS — D649 Anemia, unspecified: Secondary | ICD-10-CM | POA: Diagnosis not present

## 2022-04-08 DIAGNOSIS — J984 Other disorders of lung: Secondary | ICD-10-CM | POA: Diagnosis not present

## 2022-04-08 DIAGNOSIS — E039 Hypothyroidism, unspecified: Secondary | ICD-10-CM | POA: Diagnosis not present

## 2022-04-08 DIAGNOSIS — N182 Chronic kidney disease, stage 2 (mild): Secondary | ICD-10-CM | POA: Diagnosis not present

## 2022-04-08 DIAGNOSIS — E440 Moderate protein-calorie malnutrition: Secondary | ICD-10-CM | POA: Diagnosis not present

## 2022-04-08 DIAGNOSIS — J189 Pneumonia, unspecified organism: Secondary | ICD-10-CM | POA: Diagnosis not present

## 2022-04-08 DIAGNOSIS — I509 Heart failure, unspecified: Secondary | ICD-10-CM | POA: Diagnosis not present

## 2022-04-08 DIAGNOSIS — L89610 Pressure ulcer of right heel, unstageable: Secondary | ICD-10-CM | POA: Diagnosis not present

## 2022-04-08 DIAGNOSIS — S51812A Laceration without foreign body of left forearm, initial encounter: Secondary | ICD-10-CM | POA: Diagnosis not present

## 2022-04-09 DIAGNOSIS — R051 Acute cough: Secondary | ICD-10-CM | POA: Diagnosis not present

## 2022-04-09 DIAGNOSIS — J189 Pneumonia, unspecified organism: Secondary | ICD-10-CM | POA: Diagnosis not present

## 2022-04-15 DIAGNOSIS — J189 Pneumonia, unspecified organism: Secondary | ICD-10-CM | POA: Diagnosis not present

## 2022-04-15 DIAGNOSIS — L89610 Pressure ulcer of right heel, unstageable: Secondary | ICD-10-CM | POA: Diagnosis not present

## 2022-04-15 DIAGNOSIS — R630 Anorexia: Secondary | ICD-10-CM | POA: Diagnosis not present

## 2022-04-15 DIAGNOSIS — E440 Moderate protein-calorie malnutrition: Secondary | ICD-10-CM | POA: Diagnosis not present

## 2022-04-15 DIAGNOSIS — E039 Hypothyroidism, unspecified: Secondary | ICD-10-CM | POA: Diagnosis not present

## 2022-04-15 DIAGNOSIS — S51812A Laceration without foreign body of left forearm, initial encounter: Secondary | ICD-10-CM | POA: Diagnosis not present

## 2022-04-20 DIAGNOSIS — J984 Other disorders of lung: Secondary | ICD-10-CM | POA: Diagnosis not present

## 2022-04-20 DIAGNOSIS — I517 Cardiomegaly: Secondary | ICD-10-CM | POA: Diagnosis not present

## 2022-04-21 DIAGNOSIS — J189 Pneumonia, unspecified organism: Secondary | ICD-10-CM | POA: Diagnosis not present

## 2022-04-21 DIAGNOSIS — Z7689 Persons encountering health services in other specified circumstances: Secondary | ICD-10-CM | POA: Diagnosis not present

## 2022-04-22 DIAGNOSIS — E039 Hypothyroidism, unspecified: Secondary | ICD-10-CM | POA: Diagnosis not present

## 2022-04-22 DIAGNOSIS — L89610 Pressure ulcer of right heel, unstageable: Secondary | ICD-10-CM | POA: Diagnosis not present

## 2022-04-22 DIAGNOSIS — L89896 Pressure-induced deep tissue damage of other site: Secondary | ICD-10-CM | POA: Diagnosis not present

## 2022-04-22 DIAGNOSIS — S51812A Laceration without foreign body of left forearm, initial encounter: Secondary | ICD-10-CM | POA: Diagnosis not present

## 2022-04-27 DIAGNOSIS — I771 Stricture of artery: Secondary | ICD-10-CM | POA: Diagnosis not present

## 2022-04-27 DIAGNOSIS — I7090 Unspecified atherosclerosis: Secondary | ICD-10-CM | POA: Diagnosis not present

## 2022-04-28 DIAGNOSIS — L89896 Pressure-induced deep tissue damage of other site: Secondary | ICD-10-CM | POA: Diagnosis not present

## 2022-04-28 DIAGNOSIS — S51812A Laceration without foreign body of left forearm, initial encounter: Secondary | ICD-10-CM | POA: Diagnosis not present

## 2022-04-28 DIAGNOSIS — L89610 Pressure ulcer of right heel, unstageable: Secondary | ICD-10-CM | POA: Diagnosis not present

## 2022-04-28 DIAGNOSIS — E039 Hypothyroidism, unspecified: Secondary | ICD-10-CM | POA: Diagnosis not present

## 2022-05-05 DIAGNOSIS — R059 Cough, unspecified: Secondary | ICD-10-CM | POA: Diagnosis not present

## 2022-05-05 DIAGNOSIS — S51812A Laceration without foreign body of left forearm, initial encounter: Secondary | ICD-10-CM | POA: Diagnosis not present

## 2022-05-05 DIAGNOSIS — E039 Hypothyroidism, unspecified: Secondary | ICD-10-CM | POA: Diagnosis not present

## 2022-05-05 DIAGNOSIS — L89896 Pressure-induced deep tissue damage of other site: Secondary | ICD-10-CM | POA: Diagnosis not present

## 2022-05-05 DIAGNOSIS — L89610 Pressure ulcer of right heel, unstageable: Secondary | ICD-10-CM | POA: Diagnosis not present

## 2022-05-08 ENCOUNTER — Ambulatory Visit: Payer: Medicare PPO | Admitting: Vascular Surgery

## 2022-05-14 DIAGNOSIS — S51812A Laceration without foreign body of left forearm, initial encounter: Secondary | ICD-10-CM | POA: Diagnosis not present

## 2022-05-14 DIAGNOSIS — L89896 Pressure-induced deep tissue damage of other site: Secondary | ICD-10-CM | POA: Diagnosis not present

## 2022-05-14 DIAGNOSIS — L89610 Pressure ulcer of right heel, unstageable: Secondary | ICD-10-CM | POA: Diagnosis not present

## 2022-05-14 DIAGNOSIS — E039 Hypothyroidism, unspecified: Secondary | ICD-10-CM | POA: Diagnosis not present

## 2022-05-14 NOTE — Progress Notes (Signed)
Office Note     CC: Left heel ulcer Requesting Provider:  Garwin Brothers, MD  HPI: [name] is a 89 y.o. (08-05-1922) female presenting at the request of .Garwin Brothers, MD for right heel ulcer.  I saw [name] in December 2022 for left heel ulcer, at that healed.  Presents today accompanied by her advocate, [name] who has been involved in her care for quite some time.  She has a new, right heel ulceration.  Since her last visit, [name] took a fall, resulting in left femur fracture for which she required surgery.  She has battled back from this.  She has also been diagnosed with pneumonia has been recovering well.  She presents today with supplemental oxygen.  Supervised visit, [name] presents in a wheelchair with a Hoyer blanket underneath.  [name] denies rest pain.   Her last visit, we discussed that she is not an operative candidate as she is not ambulatory.  She was asked to represent to vascular surgery for recommendations regarding wound care.  The pt is not on a statin for cholesterol management.  The pt is not on a daily aspirin.   Other AC:   The pt is not on medication for hypertension.   The pt is not diabetic.  Tobacco hx:  -  Past Medical History:  Diagnosis Date   Atrial fibrillation (HCC)    CARCINOMA, BREAST    CHEST PAIN    DEGENERATIVE JOINT DISEASE    Diastolic heart failure ([place]) 08/03/2008   Qualifier: Diagnosis of  By: Burnett Kanaris     Difficult intubation    Edema    GIB (gastrointestinal bleeding) 05/24/2015   Humerus fracture 09/12/2020   HYPERTENSION    Long term (current) use of anticoagulants    Malignant neoplasm of female breast ([place]) 08/03/2008   Qualifier: Diagnosis of  By: Burnett Kanaris     Unspecified diastolic heart failure     Past Surgical History:  Procedure Laterality Date   BREAST LUMPECTOMY     ESOPHAGOGASTRODUODENOSCOPY (EGD) WITH PROPOFOL Left 05/28/2015   Procedure: ESOPHAGOGASTRODUODENOSCOPY (EGD) WITH PROPOFOL;   Surgeon: Wilford Corner, MD;  Location: [facility] ENDOSCOPY;  Service: Endoscopy;  Laterality: Left;   HIP SURGERY     KNEE SURGERY     LAMINECTOMY     ORIF FEMUR FRACTURE Right 08/08/2021   Procedure: OPEN REDUCTION INTERNAL FIXATION (ORIF) DISTAL FEMUR FRACTURE;  Surgeon: Willaim Sheng, MD;  Location: [facility] ORS;  Service: Orthopedics;  Laterality: Right;   TONSILLECTOMY      Social History   Socioeconomic History   Marital status: Single    Spouse name: Not on file   Number of children: Not on file   Years of education: Not on file   Highest education level: Not on file  Occupational History   Not on file  Tobacco Use   Smoking status: Never   Smokeless tobacco: Never  Vaping Use   Vaping Use: Never used  Substance and Sexual Activity   Alcohol use: No   Drug use: No   Sexual activity: Not on file  Other Topics Concern   Not on file  Social History Narrative   Not on file   Social Determinants of Health   Financial Resource Strain: Not on file  Food Insecurity: Not on file  Transportation Needs: Not on file  Physical Activity: Not on file  Stress: Not on file  Social Connections: Not on file  Intimate Partner Violence: Not on file  Family History  Problem Relation Age of Onset   CAD Mother    Heart attack Mother    Hypertension Mother    CAD Father    Stroke Brother    Heart attack Brother     Current Outpatient Medications  Medication Sig Dispense Refill   acetaminophen (TYLENOL) 325 MG tablet Take 2 tablets (650 mg total) by mouth every 6 (six) hours as needed for moderate pain.     Amino Acids-Protein Hydrolys (FEEDING SUPPLEMENT, PRO-STAT SUGAR FREE 64,) LIQD Take 30 mLs by mouth daily.     apixaban (ELIQUIS) 2.5 MG TABS tablet Take 1 tablet (2.5 mg total) by mouth 2 (two) times daily. 60 tablet 0   calcium-vitamin D (OSCAL WITH D) 500-200 MG-UNIT per tablet Take 1 tablet by mouth daily.     cyclobenzaprine (FLEXERIL) 5 MG tablet Take 5 mg by mouth at  bedtime.     docusate sodium (COLACE) 100 MG capsule Take 100 mg by mouth daily as needed for mild constipation.     Ensure (ENSURE) Take 237 mLs by mouth in the morning, at noon, in the evening, and at bedtime.     furosemide (LASIX) 40 MG tablet Take 40 mg by mouth daily.     levothyroxine (SYNTHROID) 50 MCG tablet Take 50 mcg by mouth daily.     Misc. Devices (POSTURE SEAT) MISC Lift chair 1 each 0   Multiple Vitamins-Minerals (CENTRUM PO) Take 1 tablet by mouth 2 (two) times daily.     Omega-3 Fatty Acids (FISH OIL PO) Take 1 tablet by mouth daily.     omeprazole (PRILOSEC) 20 MG capsule Take 20 mg by mouth every morning.     oxyCODONE (OXY IR/ROXICODONE) 5 MG immediate release tablet Take 1 tablet (5 mg total) by mouth every 4 (four) hours as needed for severe pain or moderate pain. 30 tablet 0   polyethylene glycol (MIRALAX) 17 g packet Take 17 g by mouth daily.     potassium chloride (KLOR-CON M) 10 MEQ tablet Take 10 mEq by mouth daily.     SYMBICORT 160-4.5 MCG/ACT inhaler Inhale 2 puffs into the lungs 2 (two) times daily.     No current facility-administered medications for this visit.    Allergies  Allergen Reactions   Morphine And Related Other (See Comments)    Other Reaction: mild Intolerance, Allergy   Sulfa Antibiotics     Other reaction(s): Other (See Comments) Other Reaction: mild Intolerance, Allergy   Meperidine Hcl Nausea Only   Nsaids     Other reaction(s): Other (See Comments) Bleeding ulcer   Tolmetin Other (See Comments)    Other reaction(s): Other (See Comments) Bleeding ulcer     REVIEW OF SYSTEMS:   [X]  denotes positive finding, [ ]  denotes negative finding Cardiac  Comments:  Chest pain or chest pressure:    Shortness of breath upon exertion:    Short of breath when lying flat:    Irregular heart rhythm:        Vascular    Pain in calf, thigh, or hip brought on by ambulation:    Pain in feet at night that wakes you up from your sleep:      Blood clot in your veins:    Leg swelling:         Pulmonary    Oxygen at home:    Productive cough:     Wheezing:         Neurologic    Sudden weakness  in arms or legs:     Sudden numbness in arms or legs:     Sudden onset of difficulty speaking or slurred speech:    Temporary loss of vision in one eye:     Problems with dizziness:         Gastrointestinal    Blood in stool:     Vomited blood:         Genitourinary    Burning when urinating:     Blood in urine:        Psychiatric    Major depression:         Hematologic    Bleeding problems:    Problems with blood clotting too easily:        Skin    Rashes or ulcers:        Constitutional    Fever or chills:      PHYSICAL EXAMINATION:  There were no vitals filed for this visit.  General:  WDWN in NAD; vital signs documented above Gait: Not observed HENT: WNL, normocephalic Pulmonary: normal non-labored breathing , without wheezing Cardiac: regular HR,  Abdomen: soft, NT, no masses Skin: without rashes Vascular Exam/Pulses:  Right Left  Radial 2+ (normal) 2+ (normal)  Ulnar 2+ (normal) 2+ (normal)  Femoral    Popliteal    DP absent absent  PT absent absent   Extremities: without ischemic changes, without Gangrene , without cellulitis; with open wounds - left heal, superficial , clean  Musculoskeletal: no muscle wasting or atrophy  Neurologic: A&O X 3;  No focal weakness or paresthesias are detected Psychiatric:  The pt has Normal affect.     ASSESSMENT/PLAN: AYOMIKUN LOYAL is a 89 y.o. female presenting with nonhealing wound on the right heel.   ***  ABI was reviewed demonstrating severe atherosclerotic disease bilaterally.  The location on the lateral aspect of the heel is typical of pressure induced wounds from lying in bed for an extended period of time.  Patient is nonambulatory, a Hoyer lift at baseline, and is therefore not a revascularization candidate.  I had a long conversation with  both [name] and [name] regarding the above, and how even with revascularization to heal the wound, her overall clinical status, including nonambulatory state, would not change.  [name] would benefit from continued, aggressive wound care.  She can follow-up with me as needed with my office. Should an infection occur I would only offer above-knee amputation.  Recommend the following which can slow the progression of atherosclerosis and reduce the risk of major adverse cardiac / limb events:  Aspirin 81mg  PO QD.  Atorvastatin 40-80mg  PO QD (or other "high intensity" statin therapy). Complete cessation from all tobacco products. Blood glucose control with goal A1c < 7%. Blood pressure control with goal blood pressure < 140/90 mmHg. Lipid reduction therapy with goal LDL-C <100 mg/dL (<70 if symptomatic from PAD).    Broadus John, MD [facility] [phone]

## 2022-05-15 ENCOUNTER — Encounter: Payer: Self-pay | Admitting: Vascular Surgery

## 2022-05-15 ENCOUNTER — Ambulatory Visit (INDEPENDENT_AMBULATORY_CARE_PROVIDER_SITE_OTHER): Payer: Medicare PPO | Admitting: Vascular Surgery

## 2022-05-15 VITALS — BP 121/74 | HR 59 | Temp 98.3°F | Resp 20

## 2022-05-15 DIAGNOSIS — I70222 Atherosclerosis of native arteries of extremities with rest pain, left leg: Secondary | ICD-10-CM | POA: Diagnosis not present

## 2022-05-18 DIAGNOSIS — S51812A Laceration without foreign body of left forearm, initial encounter: Secondary | ICD-10-CM | POA: Diagnosis not present

## 2022-05-18 DIAGNOSIS — E039 Hypothyroidism, unspecified: Secondary | ICD-10-CM | POA: Diagnosis not present

## 2022-05-18 DIAGNOSIS — L89896 Pressure-induced deep tissue damage of other site: Secondary | ICD-10-CM | POA: Diagnosis not present

## 2022-05-18 DIAGNOSIS — L89610 Pressure ulcer of right heel, unstageable: Secondary | ICD-10-CM | POA: Diagnosis not present

## 2022-05-27 ENCOUNTER — Encounter (HOSPITAL_BASED_OUTPATIENT_CLINIC_OR_DEPARTMENT_OTHER): Payer: Medicare PPO | Admitting: Physician Assistant

## 2022-05-28 ENCOUNTER — Emergency Department (HOSPITAL_COMMUNITY): Payer: Medicare PPO

## 2022-05-28 ENCOUNTER — Other Ambulatory Visit: Payer: Self-pay

## 2022-05-28 ENCOUNTER — Inpatient Hospital Stay (HOSPITAL_COMMUNITY)
Admission: EM | Admit: 2022-05-28 | Discharge: 2022-06-05 | DRG: 300 | Disposition: A | Discharge status: Skilled Nursing Facility | Payer: Medicare PPO | Source: Skilled Nursing Facility | Attending: Family Medicine | Admitting: Family Medicine

## 2022-05-28 ENCOUNTER — Encounter (HOSPITAL_COMMUNITY): Payer: Self-pay

## 2022-05-28 DIAGNOSIS — R54 Age-related physical debility: Secondary | ICD-10-CM | POA: Diagnosis present

## 2022-05-28 DIAGNOSIS — L97811 Non-pressure chronic ulcer of other part of right lower leg limited to breakdown of skin: Secondary | ICD-10-CM | POA: Diagnosis not present

## 2022-05-28 DIAGNOSIS — M868X7 Other osteomyelitis, ankle and foot: Secondary | ICD-10-CM | POA: Diagnosis not present

## 2022-05-28 DIAGNOSIS — L89890 Pressure ulcer of other site, unstageable: Secondary | ICD-10-CM | POA: Diagnosis present

## 2022-05-28 DIAGNOSIS — E039 Hypothyroidism, unspecified: Secondary | ICD-10-CM | POA: Diagnosis not present

## 2022-05-28 DIAGNOSIS — Z515 Encounter for palliative care: Secondary | ICD-10-CM | POA: Diagnosis not present

## 2022-05-28 DIAGNOSIS — Z8249 Family history of ischemic heart disease and other diseases of the circulatory system: Secondary | ICD-10-CM | POA: Diagnosis not present

## 2022-05-28 DIAGNOSIS — H9190 Unspecified hearing loss, unspecified ear: Secondary | ICD-10-CM | POA: Diagnosis present

## 2022-05-28 DIAGNOSIS — Z882 Allergy status to sulfonamides status: Secondary | ICD-10-CM | POA: Diagnosis not present

## 2022-05-28 DIAGNOSIS — L89610 Pressure ulcer of right heel, unstageable: Secondary | ICD-10-CM | POA: Diagnosis not present

## 2022-05-28 DIAGNOSIS — Z9981 Dependence on supplemental oxygen: Secondary | ICD-10-CM

## 2022-05-28 DIAGNOSIS — I70244 Atherosclerosis of native arteries of left leg with ulceration of heel and midfoot: Secondary | ICD-10-CM | POA: Diagnosis not present

## 2022-05-28 DIAGNOSIS — M1990 Unspecified osteoarthritis, unspecified site: Secondary | ICD-10-CM | POA: Diagnosis present

## 2022-05-28 DIAGNOSIS — K219 Gastro-esophageal reflux disease without esophagitis: Secondary | ICD-10-CM

## 2022-05-28 DIAGNOSIS — J9611 Chronic respiratory failure with hypoxia: Secondary | ICD-10-CM | POA: Diagnosis not present

## 2022-05-28 DIAGNOSIS — Z823 Family history of stroke: Secondary | ICD-10-CM | POA: Diagnosis not present

## 2022-05-28 DIAGNOSIS — I5032 Chronic diastolic (congestive) heart failure: Secondary | ICD-10-CM | POA: Diagnosis present

## 2022-05-28 DIAGNOSIS — L97311 Non-pressure chronic ulcer of right ankle limited to breakdown of skin: Secondary | ICD-10-CM | POA: Diagnosis not present

## 2022-05-28 DIAGNOSIS — Z66 Do not resuscitate: Secondary | ICD-10-CM | POA: Diagnosis not present

## 2022-05-28 DIAGNOSIS — L97509 Non-pressure chronic ulcer of other part of unspecified foot with unspecified severity: Secondary | ICD-10-CM | POA: Diagnosis not present

## 2022-05-28 DIAGNOSIS — Z993 Dependence on wheelchair: Secondary | ICD-10-CM | POA: Diagnosis not present

## 2022-05-28 DIAGNOSIS — R29898 Other symptoms and signs involving the musculoskeletal system: Secondary | ICD-10-CM | POA: Diagnosis not present

## 2022-05-28 DIAGNOSIS — Z885 Allergy status to narcotic agent status: Secondary | ICD-10-CM

## 2022-05-28 DIAGNOSIS — Z79890 Hormone replacement therapy (postmenopausal): Secondary | ICD-10-CM | POA: Diagnosis not present

## 2022-05-28 DIAGNOSIS — Z7189 Other specified counseling: Secondary | ICD-10-CM | POA: Diagnosis not present

## 2022-05-28 DIAGNOSIS — L97924 Non-pressure chronic ulcer of unspecified part of left lower leg with necrosis of bone: Secondary | ICD-10-CM | POA: Diagnosis not present

## 2022-05-28 DIAGNOSIS — L97429 Non-pressure chronic ulcer of left heel and midfoot with unspecified severity: Secondary | ICD-10-CM | POA: Diagnosis present

## 2022-05-28 DIAGNOSIS — L89896 Pressure-induced deep tissue damage of other site: Secondary | ICD-10-CM | POA: Diagnosis not present

## 2022-05-28 DIAGNOSIS — R609 Edema, unspecified: Secondary | ICD-10-CM | POA: Diagnosis present

## 2022-05-28 DIAGNOSIS — L97921 Non-pressure chronic ulcer of unspecified part of left lower leg limited to breakdown of skin: Secondary | ICD-10-CM | POA: Diagnosis not present

## 2022-05-28 DIAGNOSIS — Z853 Personal history of malignant neoplasm of breast: Secondary | ICD-10-CM | POA: Diagnosis not present

## 2022-05-28 DIAGNOSIS — M86171 Other acute osteomyelitis, right ankle and foot: Secondary | ICD-10-CM | POA: Diagnosis not present

## 2022-05-28 DIAGNOSIS — I5030 Unspecified diastolic (congestive) heart failure: Secondary | ICD-10-CM | POA: Diagnosis present

## 2022-05-28 DIAGNOSIS — I110 Hypertensive heart disease with heart failure: Secondary | ICD-10-CM | POA: Diagnosis not present

## 2022-05-28 DIAGNOSIS — Z888 Allergy status to other drugs, medicaments and biological substances status: Secondary | ICD-10-CM

## 2022-05-28 DIAGNOSIS — I482 Chronic atrial fibrillation, unspecified: Secondary | ICD-10-CM | POA: Diagnosis not present

## 2022-05-28 DIAGNOSIS — Z79899 Other long term (current) drug therapy: Secondary | ICD-10-CM

## 2022-05-28 DIAGNOSIS — Z7951 Long term (current) use of inhaled steroids: Secondary | ICD-10-CM

## 2022-05-28 DIAGNOSIS — Z7401 Bed confinement status: Secondary | ICD-10-CM | POA: Diagnosis not present

## 2022-05-28 DIAGNOSIS — I4891 Unspecified atrial fibrillation: Secondary | ICD-10-CM | POA: Diagnosis present

## 2022-05-28 DIAGNOSIS — I70234 Atherosclerosis of native arteries of right leg with ulceration of heel and midfoot: Principal | ICD-10-CM | POA: Diagnosis present

## 2022-05-28 DIAGNOSIS — I480 Paroxysmal atrial fibrillation: Secondary | ICD-10-CM | POA: Diagnosis present

## 2022-05-28 DIAGNOSIS — L97929 Non-pressure chronic ulcer of unspecified part of left lower leg with unspecified severity: Secondary | ICD-10-CM | POA: Diagnosis present

## 2022-05-28 DIAGNOSIS — I739 Peripheral vascular disease, unspecified: Secondary | ICD-10-CM | POA: Diagnosis not present

## 2022-05-28 DIAGNOSIS — Z6828 Body mass index (BMI) 28.0-28.9, adult: Secondary | ICD-10-CM

## 2022-05-28 DIAGNOSIS — L089 Local infection of the skin and subcutaneous tissue, unspecified: Secondary | ICD-10-CM | POA: Diagnosis not present

## 2022-05-28 DIAGNOSIS — M869 Osteomyelitis, unspecified: Secondary | ICD-10-CM

## 2022-05-28 DIAGNOSIS — M79604 Pain in right leg: Secondary | ICD-10-CM | POA: Diagnosis not present

## 2022-05-28 DIAGNOSIS — Z7901 Long term (current) use of anticoagulants: Secondary | ICD-10-CM

## 2022-05-28 DIAGNOSIS — R600 Localized edema: Secondary | ICD-10-CM | POA: Diagnosis not present

## 2022-05-28 DIAGNOSIS — I7025 Atherosclerosis of native arteries of other extremities with ulceration: Secondary | ICD-10-CM | POA: Diagnosis not present

## 2022-05-28 LAB — BASIC METABOLIC PANEL
Anion gap: 8 (ref 5–15)
BUN: 27 mg/dL — ABNORMAL HIGH (ref 8–23)
CO2: 29 mmol/L (ref 22–32)
Calcium: 8.4 mg/dL — ABNORMAL LOW (ref 8.9–10.3)
Chloride: 101 mmol/L (ref 98–111)
Creatinine, Ser: 0.59 mg/dL (ref 0.44–1.00)
GFR, Estimated: >60 mL/min (ref >60)
Glucose, Bld: 112 mg/dL — ABNORMAL HIGH (ref 70–99)
Potassium: 4 mmol/L (ref 3.5–5.1)
Sodium: 138 mmol/L (ref 135–145)

## 2022-05-28 LAB — CBC WITH DIFFERENTIAL/PLATELET
Abs Immature Granulocytes: 0.06 10*3/uL (ref 0.00–0.07)
Basophils Absolute: 0.1 10*3/uL (ref 0.0–0.1)
Basophils Relative: 0 %
Eosinophils Absolute: 0.4 10*3/uL (ref 0.0–0.5)
Eosinophils Relative: 3 %
HCT: 37.2 % (ref 36.0–46.0)
Hemoglobin: 11.8 g/dL — ABNORMAL LOW (ref 12.0–15.0)
Immature Granulocytes: 1 %
Lymphocytes Relative: 14 %
Lymphs Abs: 1.8 10*3/uL (ref 0.7–4.0)
MCH: 30.4 pg (ref 26.0–34.0)
MCHC: 31.7 g/dL (ref 30.0–36.0)
MCV: 95.9 fL (ref 80.0–100.0)
Monocytes Absolute: 1.6 10*3/uL — ABNORMAL HIGH (ref 0.1–1.0)
Monocytes Relative: 12 %
Neutro Abs: 9.4 10*3/uL — ABNORMAL HIGH (ref 1.7–7.7)
Neutrophils Relative %: 70 %
Platelets: 348 10*3/uL (ref 150–400)
RBC: 3.88 MIL/uL (ref 3.87–5.11)
RDW: 14.6 % (ref 11.5–15.5)
WBC: 13.2 10*3/uL — ABNORMAL HIGH (ref 4.0–10.5)
nRBC: 0 % (ref 0.0–0.2)

## 2022-05-28 LAB — LACTIC ACID, PLASMA: Lactic Acid, Venous: 1.9 mmol/L (ref 0.5–1.9)

## 2022-05-28 MED ORDER — ACETAMINOPHEN 650 MG RE SUPP: 650.0000 mg | Freq: Four times a day (QID) | RECTAL | Status: DC | PRN

## 2022-05-28 MED ORDER — ACETAMINOPHEN 325 MG PO TABS
650.0000 mg | ORAL_TABLET | Freq: Four times a day (QID) | ORAL | Status: DC | PRN
  Administered 2022-05-29 – 2022-05-31 (×2): 650 mg via ORAL
  Filled 2022-05-28 (×2): qty 2

## 2022-05-28 MED ORDER — ONDANSETRON HCL 4 MG PO TABS: 4.0000 mg | ORAL_TABLET | Freq: Four times a day (QID) | ORAL | Status: DC | PRN

## 2022-05-28 MED ORDER — SODIUM CHLORIDE 0.9 % IV SOLN
2.0000 g | Freq: Two times a day (BID) | INTRAVENOUS | Status: DC
  Administered 2022-05-29 – 2022-06-04 (×13): 2 g via INTRAVENOUS
  Filled 2022-05-28 (×13): qty 12.5

## 2022-05-28 MED ORDER — ONDANSETRON HCL 4 MG/2ML IJ SOLN: 4.0000 mg | Freq: Four times a day (QID) | INTRAMUSCULAR | Status: DC | PRN

## 2022-05-28 MED ORDER — DOCUSATE SODIUM 100 MG PO CAPS
100.0000 mg | ORAL_CAPSULE | Freq: Every day | ORAL | Status: DC | PRN
Start: 2022-05-28 — End: 2022-06-05
  Administered 2022-06-03: 100 mg via ORAL
  Filled 2022-05-28: qty 1

## 2022-05-28 MED ORDER — LACTATED RINGERS IV SOLN: INTRAVENOUS | Status: DC

## 2022-05-28 MED ORDER — HYDRALAZINE HCL 20 MG/ML IJ SOLN: 10.0000 mg | Freq: Four times a day (QID) | INTRAMUSCULAR | Status: DC | PRN

## 2022-05-28 MED ORDER — OXYCODONE HCL 5 MG PO TABS
5.0000 mg | ORAL_TABLET | ORAL | Status: DC | PRN
  Administered 2022-06-04: 5 mg via ORAL
  Filled 2022-05-28: qty 1

## 2022-05-28 MED ORDER — VANCOMYCIN HCL IN DEXTROSE 1-5 GM/200ML-% IV SOLN
1000.0000 mg | Freq: Once | INTRAVENOUS | Status: AC
  Administered 2022-05-28: 1000 mg via INTRAVENOUS
  Filled 2022-05-28: qty 200

## 2022-05-28 MED ORDER — SODIUM CHLORIDE 0.9 % IV SOLN
2.0000 g | Freq: Once | INTRAVENOUS | Status: AC
  Administered 2022-05-28: 2 g via INTRAVENOUS
  Filled 2022-05-28: qty 12.5

## 2022-05-28 MED ORDER — LEVOTHYROXINE SODIUM 50 MCG PO TABS
50.0000 ug | ORAL_TABLET | Freq: Every day | ORAL | Status: DC
  Administered 2022-05-29 – 2022-06-05 (×8): 50 ug via ORAL
  Filled 2022-05-28 (×8): qty 1

## 2022-05-28 MED ORDER — ENOXAPARIN SODIUM 40 MG/0.4ML IJ SOSY
40.0000 mg | PREFILLED_SYRINGE | INTRAMUSCULAR | Status: DC
  Administered 2022-05-29 – 2022-05-31 (×4): 40 mg via SUBCUTANEOUS
  Filled 2022-05-28 (×4): qty 0.4

## 2022-05-28 MED ORDER — POLYETHYLENE GLYCOL 3350 17 G PO PACK
17.0000 g | PACK | Freq: Every day | ORAL | Status: DC
Start: 2022-05-29 — End: 2022-06-05
  Administered 2022-05-30 – 2022-06-05 (×6): 17 g via ORAL
  Filled 2022-05-28 (×7): qty 1

## 2022-05-28 MED ORDER — FUROSEMIDE 40 MG PO TABS
40.0000 mg | ORAL_TABLET | Freq: Every day | ORAL | Status: DC
  Administered 2022-05-29 – 2022-06-05 (×9): 40 mg via ORAL
  Filled 2022-05-28 (×9): qty 1

## 2022-05-28 MED ORDER — PANTOPRAZOLE SODIUM 40 MG PO TBEC
40.0000 mg | DELAYED_RELEASE_TABLET | Freq: Every day | ORAL | Status: DC
  Administered 2022-05-29 – 2022-06-05 (×9): 40 mg via ORAL
  Filled 2022-05-28 (×9): qty 1

## 2022-05-28 MED ORDER — MOMETASONE FURO-FORMOTEROL FUM 200-5 MCG/ACT IN AERO
2.0000 | INHALATION_SPRAY | Freq: Two times a day (BID) | RESPIRATORY_TRACT | Status: DC
  Administered 2022-05-29 – 2022-06-05 (×15): 2 via RESPIRATORY_TRACT
  Filled 2022-05-28: qty 8.8

## 2022-05-28 MED ORDER — VANCOMYCIN HCL IN DEXTROSE 1-5 GM/200ML-% IV SOLN
1000.0000 mg | INTRAVENOUS | Status: DC
  Administered 2022-05-29 – 2022-06-01 (×4): 1000 mg via INTRAVENOUS
  Filled 2022-05-28 (×4): qty 200

## 2022-05-28 NOTE — ED Notes (Signed)
Pt has left with [facility] at this time on EMS stretcher, NAD noted, A&O x3, pt's daughter took pt's belongings, pt kept cell with her

## 2022-05-28 NOTE — ED Triage Notes (Signed)
Pt coming from [facility]. Pt has several pressure injuries on the right lower posterior portion of her leg. Wound PA at facility recommended pt come for evaluation.

## 2022-05-28 NOTE — H&P (Signed)
History and Physical  [name] [id number] DOB: 14-Sep-1922 DOA: 05/28/2022  PCP: Garwin Brothers, MD   Chief Complaint: leg pain   HPI: [name] is a 89 y.o. female with medical history significant for peripheral arterial disease, hypertension, atrial fibrillation not on rate control or anticoagulation, being admitted to the hospital due to right lower extremity chronic ulceration.  Patient has known peripheral arterial disease, has been followed by vascular surgery.  She has a history of this chronic right heel ulcer, saw vascular surgery Dr. Unk Lightning a couple of weeks ago as an outpatient, he states that she is not a surgical candidate, especially due to being nonambulatory since she had a fracture last year of the right femur.  They have been doing wound care to the right lower extremity at her nursing home, was brought to the emergency department today due to complaints of worsening erythema and discomfort in the right lower extremity.  Patient is not septic, lab work reveals an elevated white blood cell count.  She currently has no complaints, her nephew and a close family friend who are healthcare powers of attorney are at the bedside.  They deny any fevers, chills, nausea, vomiting to their knowledge.  They would like to avoid amputation if possible, though that is what Dr. Unk Lightning has continued to recommend.  In order to have further discussion and personal evaluation of the patient, vascular surgery  requested the be transferred to [facility] on the hospitalist service.  In the ER, she was given empiric IV antibiotics which was continued for now.  Review of Systems: Please see HPI for pertinent positives and negatives. A complete 10 system review of systems are otherwise negative.  Past Medical History:  Diagnosis Date   Atrial fibrillation ([place])    CARCINOMA, BREAST    CHEST PAIN    DEGENERATIVE JOINT DISEASE    Diastolic heart failure ([place]) 08/03/2008   Qualifier:  Diagnosis of  By: Burnett Kanaris     Difficult intubation    Edema    GIB (gastrointestinal bleeding) 05/24/2015   Humerus fracture 09/12/2020   HYPERTENSION    Long term (current) use of anticoagulants    Malignant neoplasm of female breast ([place]) 08/03/2008   Qualifier: Diagnosis of  By: Burnett Kanaris     Unspecified diastolic heart failure    Past Surgical History:  Procedure Laterality Date   BREAST LUMPECTOMY     ESOPHAGOGASTRODUODENOSCOPY (EGD) WITH PROPOFOL Left 05/28/2015   Procedure: ESOPHAGOGASTRODUODENOSCOPY (EGD) WITH PROPOFOL;  Surgeon: Wilford Corner, MD;  Location: [facility] ENDOSCOPY;  Service: Endoscopy;  Laterality: Left;   HIP SURGERY     KNEE SURGERY     LAMINECTOMY     ORIF FEMUR FRACTURE Right 08/08/2021   Procedure: OPEN REDUCTION INTERNAL FIXATION (ORIF) DISTAL FEMUR FRACTURE;  Surgeon: Willaim Sheng, MD;  Location: [facility] ORS;  Service: Orthopedics;  Laterality: Right;   TONSILLECTOMY      Social History:  reports that she has never smoked. She has never used smokeless tobacco. She reports that she does not drink alcohol and does not use drugs.   Allergies  Allergen Reactions   Morphine And Related Other (See Comments)    Other Reaction: mild Intolerance, Allergy   Sulfa Antibiotics     Other reaction(s): Other (See Comments) Other Reaction: mild Intolerance, Allergy   Meperidine Hcl Nausea Only   Nsaids     Other reaction(s): Other (See Comments) Bleeding ulcer   Tolmetin Other (See Comments)  Other reaction(s): Other (See Comments) Bleeding ulcer    Family History  Problem Relation Age of Onset   CAD Mother    Heart attack Mother    Hypertension Mother    CAD Father    Stroke Brother    Heart attack Brother      Prior to Admission medications   Medication Sig Start Date End Date Taking? Authorizing Provider  acetaminophen (TYLENOL) 325 MG tablet Take 2 tablets (650 mg total) by mouth every 6 (six) hours as needed for moderate pain.  09/19/20   Arrien, Jimmy Picket, MD  Amino Acids-Protein Hydrolys (FEEDING SUPPLEMENT, PRO-STAT SUGAR FREE 64,) LIQD Take 30 mLs by mouth daily.    [provider]  apixaban (ELIQUIS) 2.5 MG TABS tablet Take 1 tablet (2.5 mg total) by mouth 2 (two) times daily. 08/10/21 09/09/21  Corinne Ports, PA-C  calcium-vitamin D (OSCAL WITH D) 500-200 MG-UNIT per tablet Take 1 tablet by mouth daily.    [provider]  cyclobenzaprine (FLEXERIL) 5 MG tablet Take 5 mg by mouth at bedtime. 06/23/21   [provider]  docusate sodium (COLACE) 100 MG capsule Take 100 mg by mouth daily as needed for mild constipation.    [provider]  Ensure (ENSURE) Take 237 mLs by mouth in the morning, at noon, in the evening, and at bedtime.    [provider]  furosemide (LASIX) 40 MG tablet Take 40 mg by mouth daily. 06/23/21   [provider]  levothyroxine (SYNTHROID) 50 MCG tablet Take 50 mcg by mouth daily. 06/27/21   [provider]  Misc. Devices (POSTURE [name]) MISC Lift chair 01/19/20   Silverio Decamp, MD  Multiple Vitamins-Minerals (CENTRUM PO) Take 1 tablet by mouth 2 (two) times daily.    [provider]  Omega-3 Fatty Acids (FISH OIL PO) Take 1 tablet by mouth daily.    [provider]  omeprazole (PRILOSEC) 20 MG capsule Take 20 mg by mouth every morning. 08/26/20   [provider]  oxyCODONE (OXY IR/ROXICODONE) 5 MG immediate release tablet Take 1 tablet (5 mg total) by mouth every 4 (four) hours as needed for severe pain or moderate pain. 08/10/21   Corinne Ports, PA-C  polyethylene glycol (MIRALAX) 17 g packet Take 17 g by mouth daily.    [provider]  potassium chloride (KLOR-CON M) 10 MEQ tablet Take 10 mEq by mouth daily. 06/23/21   [provider]  SYMBICORT 160-4.5 MCG/ACT inhaler Inhale 2 puffs into the lungs 2 (two) times daily. 07/10/21   [provider]    Physical  Exam: BP 139/75   Pulse (!) 56   Temp 97.7 F (36.5 C) (Oral)   Resp (!) 21   SpO2 100%   General:  Alert, oriented to self, calm, in no acute distress, nontoxic in appearance Eyes: EOMI, clear conjuctivae, white sclerea Neck: supple, no masses, trachea mildline  Cardiovascular: RRR, no murmurs or rubs, no peripheral edema  Respiratory: clear to auscultation bilaterally, no wheezes, no crackles  Abdomen: soft, nontender, nondistended, normal bowel tones heard  Skin: dry, no rashes; right posterior heel ulcer with surrounding erythema which extends upper anterior and posterior tibial areas.  There is some edema which is pitting, on the dorsum of the right foot. Musculoskeletal: no joint effusions, normal range of motion  Psychiatric: appropriate affect, normal speech  Neurologic: extraocular muscles intact, clear speech, moving all extremities with intact sensorium  Labs on Admission:  Basic Metabolic Panel: Recent Labs  Lab 05/28/22 1544  NA 138  K 4.0  CL 101  CO2 29  GLUCOSE 112*  BUN 27*  CREATININE 0.59  CALCIUM 8.4*   Liver Function Tests: No results for input(s): "AST", "ALT", "ALKPHOS", "BILITOT", "PROT", "ALBUMIN" in the last 168 hours. No results for input(s): "LIPASE", "AMYLASE" in the last 168 hours. No results for input(s): "AMMONIA" in the last 168 hours. CBC: Recent Labs  Lab 05/28/22 1544  WBC 13.2*  NEUTROABS 9.4*  HGB 11.8*  HCT 37.2  MCV 95.9  PLT 348   Cardiac Enzymes: No results for input(s): "CKTOTAL", "CKMB", "CKMBINDEX", "TROPONINI" in the last 168 hours.  BNP (last 3 results) No results for input(s): "BNP" in the last 8760 hours.  ProBNP (last 3 results) No results for input(s): "PROBNP" in the last 8760 hours.  CBG: No results for input(s): "GLUCAP" in the last 168 hours.  Radiological Exams on Admission: DG Foot Complete Right  Result Date: 05/28/2022 CLINICAL DATA:  Infection, pressure ulcers EXAM: RIGHT FOOT COMPLETE  - 3+ VIEW; RIGHT ANKLE - COMPLETE 3+ VIEW COMPARISON:  None Available. FINDINGS: Fracture or dislocation of the right foot or ankle. Disuse osteopenia. Moderate right first metatarsophalangeal arthrosis with otherwise mild arthrosis about the foot and ankle. Diffuse soft tissue edema, particularly about the forefoot. Probable soft tissue wound of the posterior heel. Vascular calcinosis. IMPRESSION: 1. No fracture or dislocation of the right foot or ankle. Disuse osteopenia. 2. Diffuse soft tissue edema, particularly about the forefoot. Probable soft tissue wound of the posterior heel. No radiographic findings to suggest osteomyelitis. MRI is the most sensitive test for the detection of bone marrow edema and osteomyelitis if suspected. Electronically Signed   By: Delanna Ahmadi M.D.   On: 05/28/2022 14:33   DG Ankle Complete Right  Result Date: 05/28/2022 CLINICAL DATA:  Infection, pressure ulcers EXAM: RIGHT FOOT COMPLETE - 3+ VIEW; RIGHT ANKLE - COMPLETE 3+ VIEW COMPARISON:  None Available. FINDINGS: Fracture or dislocation of the right foot or ankle. Disuse osteopenia. Moderate right first metatarsophalangeal arthrosis with otherwise mild arthrosis about the foot and ankle. Diffuse soft tissue edema, particularly about the forefoot. Probable soft tissue wound of the posterior heel. Vascular calcinosis. IMPRESSION: 1. No fracture or dislocation of the right foot or ankle. Disuse osteopenia. 2. Diffuse soft tissue edema, particularly about the forefoot. Probable soft tissue wound of the posterior heel. No radiographic findings to suggest osteomyelitis. MRI is the most sensitive test for the detection of bone marrow edema and osteomyelitis if suspected. Electronically Signed   By: Delanna Ahmadi M.D.   On: 05/28/2022 14:33    Assessment/Plan Principal Problem:   Leg ulcer, left ([place]), with surrounding erythema-the patient is nontoxic and not septic.  It seems this is a chronic wound that we will have very poor  healing, and very little likelihood of resolution.  The patient is already bedbound.  Discussed with family at the bedside that the most compassionate thing to do may be to amputate, in order to avoid further pain, infections, or other systemic complications. -Inpatient admission to [facility] -Continue empiric vancomycin and cefepime for now -Anticipate inpatient vascular surgery consultation -I did recommend to the family that the strongly consider amputation Active Problems:   Atrial fibrillation (HCC)   Diastolic heart failure ([place]) -continue Lasix, no evidence of exacerbation   Edema   Bedbound   GERD (gastroesophageal reflux disease) -continue p.o. PPI  DVT prophylaxis: Lovenox  Code Status: Full Code, as discussed with the patient's healthcare power of attorney at the time of admission.  Consults called: None  Admission status: The appropriate patient status for this patient is INPATIENT. Inpatient status is judged to be reasonable and necessary in order to provide the required intensity of service to ensure the patient's safety. The patient's presenting symptoms, physical exam findings, and initial radiographic and laboratory data in the context of their chronic comorbidities is felt to place them at high risk for further clinical deterioration. Furthermore, it is not anticipated that the patient will be medically stable for discharge from the hospital within 2 midnights of admission.    I certify that at the point of admission it is my clinical judgment that the patient will require inpatient hospital care spanning beyond 2 midnights from the point of admission due to high intensity of service, high risk for further deterioration and high frequency of surveillance required   Time spent: 48 minutes  [name] Weigand Neva Seat MD [facility] Pager [phone]  If 7PM-7AM, please contact night-coverage [email or web address] Password [facility]  05/28/2022, 6:21 PM

## 2022-05-28 NOTE — ED Notes (Signed)
[facility] has arrived to transport pt to [facility] hospital

## 2022-05-28 NOTE — ED Notes (Signed)
This RN called [facility] for update, was advised [facility] has a truck in route at this time to transport pt. Family at bedside was updated and they were very thankful

## 2022-05-28 NOTE — ED Provider Notes (Signed)
[place] EMERGENCY DEPARTMENT AT [facility] Provider Note   CSN: [id number] Arrival date & time: 05/28/22  1342     History  Chief Complaint  Patient presents with  . Wound Check    [name] is a 89 y.o. female.  89 year old female presents due to wound that is infected at her right lower extremity.  Patient has a history of chronic right heel ulcer was seen by vascular surgery 2 weeks ago.  Patient is not a surgical candidate due to her being nonambulatory.  According to Dr. Unk Lightning, the vascular surgeon saw the patient, he states that the only therapy he can offer right now is amputation.  She has been going to wound care and was seen there today and was noted to have new and worsening erythema to her right lower extremity.  Was sent here by EMS for further evaluation       Home Medications Prior to Admission medications   Medication Sig Start Date End Date Taking? Authorizing Provider  acetaminophen (TYLENOL) 325 MG tablet Take 2 tablets (650 mg total) by mouth every 6 (six) hours as needed for moderate pain. 09/19/20   Arrien, Jimmy Picket, MD  Amino Acids-Protein Hydrolys (FEEDING SUPPLEMENT, PRO-STAT SUGAR FREE 64,) LIQD Take 30 mLs by mouth daily.    [provider]  apixaban (ELIQUIS) 2.5 MG TABS tablet Take 1 tablet (2.5 mg total) by mouth 2 (two) times daily. 08/10/21 09/09/21  Corinne Ports, PA-C  calcium-vitamin D (OSCAL WITH D) 500-200 MG-UNIT per tablet Take 1 tablet by mouth daily.    [provider]  cyclobenzaprine (FLEXERIL) 5 MG tablet Take 5 mg by mouth at bedtime. 06/23/21   [provider]  docusate sodium (COLACE) 100 MG capsule Take 100 mg by mouth daily as needed for mild constipation.    [provider]  Ensure (ENSURE) Take 237 mLs by mouth in the morning, at noon, in the evening, and at bedtime.    [provider]  furosemide (LASIX) 40 MG tablet Take 40 mg by mouth daily. 06/23/21    [provider]  levothyroxine (SYNTHROID) 50 MCG tablet Take 50 mcg by mouth daily. 06/27/21   [provider]  Misc. Devices (POSTURE SEAT) MISC Lift chair 01/19/20   Silverio Decamp, MD  Multiple Vitamins-Minerals (CENTRUM PO) Take 1 tablet by mouth 2 (two) times daily.    [provider]  Omega-3 Fatty Acids (FISH OIL PO) Take 1 tablet by mouth daily.    [provider]  omeprazole (PRILOSEC) 20 MG capsule Take 20 mg by mouth every morning. 08/26/20   [provider]  oxyCODONE (OXY IR/ROXICODONE) 5 MG immediate release tablet Take 1 tablet (5 mg total) by mouth every 4 (four) hours as needed for severe pain or moderate pain. 08/10/21   Corinne Ports, PA-C  polyethylene glycol (MIRALAX) 17 g packet Take 17 g by mouth daily.    [provider]  potassium chloride (KLOR-CON M) 10 MEQ tablet Take 10 mEq by mouth daily. 06/23/21   [provider]  SYMBICORT 160-4.5 MCG/ACT inhaler Inhale 2 puffs into the lungs 2 (two) times daily. 07/10/21   [provider]      Allergies    Morphine and related, Sulfa antibiotics, Meperidine hcl, Nsaids, and Tolmetin    Review of Systems   Review of Systems  All other systems reviewed and are negative.   Physical Exam Updated Vital Signs BP (!) 127/55 (BP  Location: Left Arm)   Pulse (!) 57   Temp 97.7 F (36.5 C) (Oral)   Resp 18   SpO2 100%  Physical Exam Vitals and nursing note reviewed.  Constitutional:      General: She is not in acute distress.    Appearance: Normal appearance. She is well-developed. She is not toxic-appearing.  HENT:     Head: Normocephalic and atraumatic.  Eyes:     General: Lids are normal.     Conjunctiva/sclera: Conjunctivae normal.     Pupils: Pupils are equal, round, and reactive to light.  Neck:     Thyroid: No thyroid mass.     Trachea: No tracheal deviation.  Cardiovascular:     Rate and Rhythm: Normal rate and regular rhythm.      Heart sounds: Normal heart sounds. No murmur heard.    No gallop.  Pulmonary:     Effort: Pulmonary effort is normal. No respiratory distress.     Breath sounds: Normal breath sounds. No stridor. No decreased breath sounds, wheezing, rhonchi or rales.  Abdominal:     General: There is no distension.     Palpations: Abdomen is soft.     Tenderness: There is no abdominal tenderness. There is no rebound.  Musculoskeletal:        General: No tenderness. Normal range of motion.     Cervical back: Normal range of motion and neck supple.       Legs:  Skin:    General: Skin is warm and dry.     Findings: No abrasion or rash.  Neurological:     Mental Status: She is alert and oriented to person, place, and time. Mental status is at baseline.     GCS: GCS eye subscore is 4. GCS verbal subscore is 5. GCS motor subscore is 6.     Cranial Nerves: No cranial nerve deficit.     Sensory: No sensory deficit.  Psychiatric:        Attention and Perception: Attention normal.        Speech: Speech normal.        Behavior: Behavior normal.     ED Results / Procedures / Treatments   Labs (all labs ordered are listed, but only abnormal results are displayed) Labs Reviewed  CULTURE, BLOOD (ROUTINE X 2)  CULTURE, BLOOD (ROUTINE X 2)  CBC WITH DIFFERENTIAL/PLATELET  BASIC METABOLIC PANEL  LACTIC ACID, PLASMA  LACTIC ACID, PLASMA    EKG None  Radiology No results found.  Procedures Procedures    Medications Ordered in ED Medications  lactated ringers infusion (has no administration in time range)    ED Course/ Medical Decision Making/ A&P Clinical Course as of 05/28/22 1617  Thu May 28, 2022  1615 Stable 89yom with a chief complaint of RLE wound AoC. Now cellulitic  Dr. Unk Lightning with vas surg is aware. Request [facility] Admission. Will likely need amputation but family is resistant. [CC]    Clinical Course User Index [CC] Tretha Sciara, MD                             Medical  Decision Making Amount and/or Complexity of Data Reviewed Labs: ordered. Radiology: ordered.  Risk Prescription drug management.   Started on IV vancomycin for cellulitis and possible deep tissue infection.  Discussed with Dr. Unk Lightning from vascular surgery who states that patient can be admitted to [facility] with discussion for possible amputation.  Discussed  with family and patient at bedside.  Labs pending at this time signout next provider who will admit the patient         Final Clinical Impression(s) / ED Diagnoses Final diagnoses:  None    Rx / DC Orders ED Discharge Orders     None         Lacretia Leigh, MD 05/28/22 1617

## 2022-05-28 NOTE — ED Provider Notes (Signed)
Care of patient received from prior provider at 5:50 PM, please see their note for complete H/P and care plan.  Received handoff per ED course.  Clinical Course as of 05/28/22 1750  Thu May 28, 2022  1615 Stable 89yom with a chief complaint of RLE wound AoC. Now cellulitic  Dr. Unk Lightning with vas surg is aware. Request [facility] Admission. Will likely need amputation but family is resistant. [CC]    Clinical Course User Index [CC] Tretha Sciara, MD    Reassessment: Discussed with hospitalist.  They agreed with need for admission.  Patient will be arranged for admission at [facility] main campus.     Tretha Sciara, MD 05/28/22 1750

## 2022-05-28 NOTE — ED Notes (Addendum)
ED TO INPATIENT HANDOFF REPORT  ED Nurse Name and Phone #: Lawernce Pitts [phone]  S Name/Age/Gender [name] 89 y.o. female Room/Bed: WA17/WA17  Code Status   Code Status: Full Code  Home/SNF/Other [place] Patient oriented to: self, place, and situation Is this baseline? Yes   Triage Complete: Triage complete  Chief Complaint Right ankle ulcer  Triage Note Pt coming from [facility]. Pt has several pressure injuries on the right lower posterior portion of her leg. Wound PA at facility recommended pt come for evaluation.     Allergies Allergies  Allergen Reactions   Morphine And Related Other (See Comments)    Other Reaction: mild Intolerance, Allergy   Sulfa Antibiotics     Other reaction(s): Other (See Comments) Other Reaction: mild Intolerance, Allergy   Meperidine Hcl Nausea Only   Nsaids     Other reaction(s): Other (See Comments) Bleeding ulcer   Tolmetin Other (See Comments)    Other reaction(s): Other (See Comments) Bleeding ulcer    Level of Care/Admitting Diagnosis ED Disposition     ED Disposition  Admit   Condition  --   [place]: [place] [100100]  Level of Care: Med-Surg [16]  May admit patient to [facility] or [facility] if equivalent level of care is available:: No  Covid Evaluation: Asymptomatic - no recent exposure (last 10 days) testing not required  Diagnosis: Leg ulcer, left [id number]  Admitting Physician: Lucillie Garfinkel [id number]  Attending Physician: [facility], MIR Kari.[place] [id number]  Certification:: I certify this patient will need inpatient services for at least 2 midnights  Estimated Length of Stay: 3          B Medical/Surgery History Past Medical History:  Diagnosis Date   Atrial fibrillation ([place])    CARCINOMA, BREAST    CHEST PAIN    DEGENERATIVE JOINT DISEASE    Diastolic heart failure ([place]) 08/03/2008   Qualifier: Diagnosis of  By: Burnett Kanaris      Difficult intubation    Edema    GIB (gastrointestinal bleeding) 05/24/2015   Humerus fracture 09/12/2020   HYPERTENSION    Long term (current) use of anticoagulants    Malignant neoplasm of female breast ([place]) 08/03/2008   Qualifier: Diagnosis of  By: Burnett Kanaris     Unspecified diastolic heart failure    Past Surgical History:  Procedure Laterality Date   BREAST LUMPECTOMY     ESOPHAGOGASTRODUODENOSCOPY (EGD) WITH PROPOFOL Left 05/28/2015   Procedure: ESOPHAGOGASTRODUODENOSCOPY (EGD) WITH PROPOFOL;  Surgeon: Wilford Corner, MD;  Location: [facility] ENDOSCOPY;  Service: Endoscopy;  Laterality: Left;   HIP SURGERY     KNEE SURGERY     LAMINECTOMY     ORIF FEMUR FRACTURE Right 08/08/2021   Procedure: OPEN REDUCTION INTERNAL FIXATION (ORIF) DISTAL FEMUR FRACTURE;  Surgeon: Willaim Sheng, MD;  Location: [facility] ORS;  Service: Orthopedics;  Laterality: Right;   TONSILLECTOMY       A IV Location/Drains/Wounds Patient Lines/Drains/Airways Status     Active Line/Drains/Airways     Name Placement date Placement time Site Days   Peripheral IV 05/28/22 20 G 1.75" Anterior;Distal;Left;Upper Arm 05/28/22  1539  Arm  less than 1   Incision (Closed) 08/08/21 Knee Right 08/08/21  1600  -- 293            Intake/Output Last 24 hours  Intake/Output Summary (Last 24 hours) at 05/28/2022 1941 Last data filed at 05/28/2022 1735 Gross per 24 hour  Intake 200 ml  Output --  Net 200 ml    Labs/Imaging Results for orders placed or performed during the hospital encounter of 05/28/22 (from the past 48 hour(s))  CBC with Differential/Platelet     Status: Abnormal   Collection Time: 05/28/22  3:44 PM  Result Value Ref Range   WBC 13.2 (H) 4.0 - 10.5 K/uL   RBC 3.88 3.87 - 5.11 MIL/uL   Hemoglobin 11.8 (L) 12.0 - 15.0 g/dL   HCT 37.2 36.0 - 46.0 %   MCV 95.9 80.0 - 100.0 fL   MCH 30.4 26.0 - 34.0 pg   MCHC 31.7 30.0 - 36.0 g/dL   RDW 14.6 11.5 - 15.5 %   Platelets 348 150 - 400 K/uL    nRBC 0.0 0.0 - 0.2 %   Neutrophils Relative % 70 %   Neutro Abs 9.4 (H) 1.7 - 7.7 K/uL   Lymphocytes Relative 14 %   Lymphs Abs 1.8 0.7 - 4.0 K/uL   Monocytes Relative 12 %   Monocytes Absolute 1.6 (H) 0.1 - 1.0 K/uL   Eosinophils Relative 3 %   Eosinophils Absolute 0.4 0.0 - 0.5 K/uL   Basophils Relative 0 %   Basophils Absolute 0.1 0.0 - 0.1 K/uL   Immature Granulocytes 1 %   Abs Immature Granulocytes 0.06 0.00 - 0.07 K/uL    Comment: Performed at [facility], [place] [address] [place], [place] [id number]  Basic metabolic panel     Status: Abnormal   Collection Time: 05/28/22  3:44 PM  Result Value Ref Range   Sodium 138 135 - 145 mmol/L   Potassium 4.0 3.5 - 5.1 mmol/L   Chloride 101 98 - 111 mmol/L   CO2 29 22 - 32 mmol/L   Glucose, Bld 112 (H) 70 - 99 mg/dL    Comment: Glucose reference range applies only to samples taken after fasting for at least 8 hours.   BUN 27 (H) 8 - 23 mg/dL   Creatinine, Ser 0.59 0.44 - 1.00 mg/dL   Calcium 8.4 (L) 8.9 - 10.3 mg/dL   GFR, Estimated >60 >60 mL/min    Comment: (NOTE) Calculated using the CKD-EPI Creatinine Equation (2021)    Anion gap 8 5 - 15    Comment: Performed at [facility], [place] [address], [place], [place] [address]  Lactic acid, plasma     Status: None   Collection Time: 05/28/22  3:44 PM  Result Value Ref Range   Lactic Acid, Venous 1.9 0.5 - 1.9 mmol/L    Comment: Performed at [facility], [place] [address] [place], [place] [address]   DG Foot Complete Right  Result Date: 05/28/2022 CLINICAL DATA:  Infection, pressure ulcers EXAM: RIGHT FOOT COMPLETE - 3+ VIEW; RIGHT ANKLE - COMPLETE 3+ VIEW COMPARISON:  None Available. FINDINGS: Fracture or dislocation of the right foot or ankle. Disuse osteopenia. Moderate right first metatarsophalangeal arthrosis with otherwise mild arthrosis about the foot and ankle. Diffuse soft tissue edema, particularly about the forefoot.  Probable soft tissue wound of the posterior heel. Vascular calcinosis. IMPRESSION: 1. No fracture or dislocation of the right foot or ankle. Disuse osteopenia. 2. Diffuse soft tissue edema, particularly about the forefoot. Probable soft tissue wound of the posterior heel. No radiographic findings to suggest osteomyelitis. MRI is the most sensitive test for the detection of bone marrow edema and osteomyelitis if suspected. Electronically Signed   By: Delanna Ahmadi M.D.   On: 05/28/2022 14:33  DG Ankle Complete Right  Result Date: 05/28/2022 CLINICAL DATA:  Infection, pressure ulcers EXAM: RIGHT FOOT COMPLETE - 3+ VIEW; RIGHT ANKLE - COMPLETE 3+ VIEW COMPARISON:  None Available. FINDINGS: Fracture or dislocation of the right foot or ankle. Disuse osteopenia. Moderate right first metatarsophalangeal arthrosis with otherwise mild arthrosis about the foot and ankle. Diffuse soft tissue edema, particularly about the forefoot. Probable soft tissue wound of the posterior heel. Vascular calcinosis. IMPRESSION: 1. No fracture or dislocation of the right foot or ankle. Disuse osteopenia. 2. Diffuse soft tissue edema, particularly about the forefoot. Probable soft tissue wound of the posterior heel. No radiographic findings to suggest osteomyelitis. MRI is the most sensitive test for the detection of bone marrow edema and osteomyelitis if suspected. Electronically Signed   By: Delanna Ahmadi M.D.   On: 05/28/2022 14:33    Pending Labs Unresulted Labs (From admission, onward)     Start     Ordered   06/04/22 0500  Creatinine, serum  (enoxaparin (LOVENOX)    CrCl >/= 30 ml/min)  Weekly,   R     Comments: while on enoxaparin therapy    05/28/22 1807   05/29/22 [id number]  Basic metabolic panel  Tomorrow morning,   R        05/28/22 1807   05/29/22 0500  CBC  Tomorrow morning,   R        05/28/22 1807   05/28/22 1807  CBC  (enoxaparin (LOVENOX)    CrCl >/= 30 ml/min)  Once,   R       Comments: Baseline for enoxaparin  therapy IF NOT ALREADY DRAWN.  Notify MD if PLT < 100 K.    05/28/22 1807   05/28/22 1807  Creatinine, serum  (enoxaparin (LOVENOX)    CrCl >/= 30 ml/min)  Once,   R       Comments: Baseline for enoxaparin therapy IF NOT ALREADY DRAWN.    05/28/22 1807   05/28/22 1407  Lactic acid, plasma  Now then every 2 hours,   R (with STAT occurrences)      05/28/22 1407   05/28/22 1407  Culture, blood (Routine X 2) w Reflex to ID Panel  BLOOD CULTURE X 2,   R (with STAT occurrences)     Question:  Patient immune status  Answer:  Normal   05/28/22 1407            Vitals/Pain Today's Vitals   05/28/22 1859 05/28/22 1900 05/28/22 1916 05/28/22 1927  BP:  (!) 146/61    Pulse:  (!) 56    Resp:  (!) 21    Temp: 97.9 F (36.6 C)   (!) 97.5 F (36.4 C)  TempSrc: Oral   Oral  SpO2:  97%    PainSc:   2      Isolation Precautions No active isolations  Medications Medications  lactated ringers infusion ( Intravenous New Bag/Given 05/28/22 1542)  oxyCODONE (Oxy IR/ROXICODONE) immediate release tablet 5 mg (has no administration in time range)  furosemide (LASIX) tablet 40 mg (has no administration in time range)  levothyroxine (SYNTHROID) tablet 50 mcg (has no administration in time range)  pantoprazole (PROTONIX) EC tablet 40 mg (has no administration in time range)  polyethylene glycol (MIRALAX / GLYCOLAX) packet 17 g (has no administration in time range)  docusate sodium (COLACE) capsule 100 mg (has no administration in time range)  mometasone-formoterol (DULERA) 200-5 MCG/ACT inhaler 2 puff (has no administration in time range)  enoxaparin (LOVENOX)  injection 40 mg (has no administration in time range)  acetaminophen (TYLENOL) tablet 650 mg (has no administration in time range)    Or  acetaminophen (TYLENOL) suppository 650 mg (has no administration in time range)  ondansetron (ZOFRAN) tablet 4 mg (has no administration in time range)    Or  ondansetron (ZOFRAN) injection 4 mg (has no  administration in time range)  hydrALAZINE (APRESOLINE) injection 10 mg (has no administration in time range)  ceFEPIme (MAXIPIME) 1 g in sodium chloride 0.9 % 100 mL IVPB (has no administration in time range)  vancomycin (VANCOCIN) IVPB 1000 mg/200 mL premix (0 mg Intravenous Stopped 05/28/22 1735)  ceFEPIme (MAXIPIME) 2 g in sodium chloride 0.9 % 100 mL IVPB (2 g Intravenous New Bag/Given 05/28/22 1852)    Mobility non-ambulatory, pt has decrease mobility, mainly bedridden, needs lift for transfer assitance to chair, does not use a wheelchair     R Recommendations: See Admitting Provider Note  Report given to: Lorra Hals, RN  Additional Notes: Pt difficult stick for IV access, ultrasound guided IV to left upper arm, right limb restricted d/t breast cancer, pt alert and oriented to her baseline, will follow simple commands, pt is hard of hearing.

## 2022-05-28 NOTE — ED Notes (Signed)
This RN notified carelink for transfer from [facility] ED to [facility]

## 2022-05-28 NOTE — ED Notes (Signed)
Pt was clean from urinated brief, peri care done, new brief applied. Pt repositioned in bed for comfort.

## 2022-05-28 NOTE — Progress Notes (Signed)
Pharmacy Antibiotic Note  [name] is a 89 y.o. female admitted on 05/28/2022 with wound infection.  Pharmacy has been consulted for vancomycin dosing; cefepime per MD. Pt noted to be immobile since femur frx last year; SCr likely underestimates GFR.  Plan: Vancomycin 1000 mg IV q24 hr (est AUC 538 based on SCr rounded to 0.8; Vd 0.5) Measure vancomycin AUC at steady state as indicated SCr q48 while on vanc MRSA PCR ordered; f/u and narrow vanc as appropriate Cefepime 2 g IV q12 hr per MD; dosing appropriate    Temp (24hrs), Avg:97.7 F (36.5 C), Min:97.5 F (36.4 C), Max:97.9 F (36.6 C)  Recent Labs  Lab 05/28/22 1544  WBC 13.2*  CREATININE 0.59  LATICACIDVEN 1.9    CrCl cannot be calculated (Unknown ideal weight.).    Allergies  Allergen Reactions   Morphine And Related Other (See Comments)    Other Reaction: mild Intolerance, Allergy   Sulfa Antibiotics     Other reaction(s): Other (See Comments) Other Reaction: mild Intolerance, Allergy   Meperidine Hcl Nausea Only   Nsaids     Other reaction(s): Other (See Comments) Bleeding ulcer   Tolmetin Other (See Comments)    Other reaction(s): Other (See Comments) Bleeding ulcer    Antimicrobials this admission: 3/28 vancomycin >>  3/28 cefepime >>   Dose adjustments this admission: N/a  Microbiology results: 3/28 BCx: sent   Thank you for allowing pharmacy to be a part of this patient's care.  Tanija Germani A 05/28/2022 8:54 PM

## 2022-05-29 DIAGNOSIS — L97921 Non-pressure chronic ulcer of unspecified part of left lower leg limited to breakdown of skin: Secondary | ICD-10-CM | POA: Diagnosis not present

## 2022-05-29 LAB — CBC
HCT: 34.3 % — ABNORMAL LOW (ref 36.0–46.0)
Hemoglobin: 11.4 g/dL — ABNORMAL LOW (ref 12.0–15.0)
MCH: 30.9 pg (ref 26.0–34.0)
MCHC: 33.2 g/dL (ref 30.0–36.0)
MCV: 93 fL (ref 80.0–100.0)
Platelets: 317 10*3/uL (ref 150–400)
RBC: 3.69 MIL/uL — ABNORMAL LOW (ref 3.87–5.11)
RDW: 14.4 % (ref 11.5–15.5)
WBC: 13 10*3/uL — ABNORMAL HIGH (ref 4.0–10.5)
nRBC: 0 % (ref 0.0–0.2)

## 2022-05-29 LAB — BASIC METABOLIC PANEL
Anion gap: 10 (ref 5–15)
BUN: 20 mg/dL (ref 8–23)
CO2: 26 mmol/L (ref 22–32)
Calcium: 8.4 mg/dL — ABNORMAL LOW (ref 8.9–10.3)
Chloride: 100 mmol/L (ref 98–111)
Creatinine, Ser: 0.6 mg/dL (ref 0.44–1.00)
GFR, Estimated: >60 mL/min (ref >60)
Glucose, Bld: 108 mg/dL — ABNORMAL HIGH (ref 70–99)
Potassium: 3.7 mmol/L (ref 3.5–5.1)
Sodium: 136 mmol/L (ref 135–145)

## 2022-05-29 MED ORDER — ENSURE ENLIVE PO LIQD: 237.0000 mL | Freq: Two times a day (BID) | ORAL | Status: DC

## 2022-05-29 MED ORDER — CYCLOBENZAPRINE HCL 10 MG PO TABS: 5.0000 mg | ORAL_TABLET | Freq: Three times a day (TID) | ORAL | Status: DC | PRN

## 2022-05-29 MED ORDER — ORAL CARE MOUTH RINSE: 15.0000 mL | OROMUCOSAL | Status: DC | PRN

## 2022-05-29 MED ORDER — IPRATROPIUM-ALBUTEROL 0.5-2.5 (3) MG/3ML IN SOLN: 3.0000 mL | Freq: Four times a day (QID) | RESPIRATORY_TRACT | Status: DC | PRN

## 2022-05-29 MED ORDER — ENSURE ENLIVE PO LIQD
237.0000 mL | Freq: Three times a day (TID) | ORAL | Status: DC
  Administered 2022-05-30 – 2022-06-05 (×12): 237 mL via ORAL

## 2022-05-29 MED ORDER — OYSTER SHELL CALCIUM/D3 500-5 MG-MCG PO TABS
1.0000 | ORAL_TABLET | Freq: Every day | ORAL | Status: DC
  Administered 2022-05-29 – 2022-06-05 (×8): 1 via ORAL
  Filled 2022-05-29 (×8): qty 1

## 2022-05-29 NOTE — Evaluation (Signed)
Physical Therapy Evaluation Patient Details Name: [name] MRN: [id number] DOB: 02-08-1923 Today's Date: 05/29/2022  History of Present Illness  89 yo female presenting from facility on 3/28 due to pressure wounds of RLE. PMH includes: PAD, HTN, afib, hip surgery, ORIF of R femur fx 2023.   Clinical Impression  Pt in bed upon arrival of PT, agreeable to evaluation at this time. Prior to admission the pt was primarily bed-bound for ~1 year, staff at facility use lift to move pt from bed-chair. The pt now presents with limitations in functional mobility, strength, ROM, and activity tolerance due to above dx and chronic debility, and will continue to benefit from skilled PT to address these deficits. The pt's family is most interested in exercising the pt's extremities to maintain strength and ROM she has left, but they are realistic in understanding the pt's mobility limitations at this time. The pt will be safe to return to SNF when medically stable, will attempt to provide family with HEP the pt can complete outside of therapy sessions to meet their goals.        Recommendations for follow up therapy are one component of a multi-disciplinary discharge planning process, led by the attending physician.  Recommendations may be updated based on patient status, additional functional criteria and insurance authorization.  Follow Up Recommendations Can patient physically be transported by private vehicle: No     Assistance Recommended at Discharge Frequent or constant Supervision/Assistance  Patient can return home with the following  Two people to help with walking and/or transfers;Two people to help with bathing/dressing/bathroom;Assistance with feeding;Direct supervision/assist for financial management;Direct supervision/assist for medications management;Assist for transportation;Help with stairs or ramp for entrance    Equipment Recommendations None recommended by PT  Recommendations for  Other Services       Functional Status Assessment Patient has had a recent decline in their functional status and demonstrates the ability to make significant improvements in function in a reasonable and predictable amount of time.     Precautions / Restrictions Precautions Precautions: Fall Precaution Comments: significant wounds BLE Restrictions Weight Bearing Restrictions: No      Mobility  Bed Mobility Overal bed mobility: Needs Assistance Bed Mobility: Rolling Rolling: Max assist, +2 for physical assistance         General bed mobility comments: pt attempting to assist with movement of UE, not able to generate enough force to assist.    Transfers Overall transfer level: Needs assistance Equipment used: Ambulation equipment used Transfers: Bed to chair/wheelchair/BSC             General transfer comment: totalA with maximove to chair from bed Transfer via Lift Equipment: Maximove  Ambulation/Gait               General Gait Details: pt non-ambulatory       Pertinent Vitals/Pain Pain Assessment Pain Assessment: Faces Faces Pain Scale: Hurts even more Pain Location: LE with touch or movement Pain Descriptors / Indicators: Discomfort, Grimacing, Moaning Pain Intervention(s): Limited activity within patient's tolerance, Monitored during session, Repositioned    Home Living Family/patient expects to be discharged to:: Skilled nursing facility                   Additional Comments: pt is from LTC at [facility]    Prior Function Prior Level of Function : Needs assist             Mobility Comments: hoyer lift to recliner, does not use WC, has not  walked for >1 year ADLs Comments: assist     Hand Dominance   Dominant Hand: Right    Extremity/Trunk Assessment   Upper Extremity Assessment Upper Extremity Assessment: Generalized weakness (significant ROM restrictions bilaterally, sensation intact.)    Lower Extremity  Assessment Lower Extremity Assessment: Generalized weakness (grossly poor ROM, limited AROM bilaterally but pt able to move toes and ankles slightly and flex knees slightly against gravity. has not ambulated in > 1 year)    Cervical / Trunk Assessment Cervical / Trunk Assessment: Kyphotic  Communication   Communication: HOH  Cognition Arousal/Alertness: Awake/alert Behavior During Therapy: WFL for tasks assessed/performed Overall Cognitive Status: Within Functional Limits for tasks assessed                                          General Comments General comments (skin integrity, edema, etc.): significant wounds BLE, worse on RLE. has prevalon boots from facility    Exercises General Exercises - Upper Extremity Shoulder Flexion: AROM, Both, 5 reps, Seated Wrist Flexion: AROM, Both, 5 reps Wrist Extension: AROM, Both, 5 reps Digit Composite Flexion: AROM, Both, 5 reps Composite Extension: AROM, Both, 5 reps General Exercises - Lower Extremity Ankle Circles/Pumps: AROM, Both, 5 reps   Assessment/Plan    PT Assessment Patient needs continued PT services  PT Problem List Decreased strength;Decreased range of motion;Decreased activity tolerance       PT Treatment Interventions Therapeutic exercise    PT Goals (Current goals can be found in the Care Plan section)  Acute Rehab PT Goals Patient Stated Goal: maintain strength PT Goal Formulation: With patient/family Time For Goal Achievement: 06/12/22 Potential to Achieve Goals: Fair    Frequency Min 1X/week        AM-PAC PT "6 Clicks" Mobility  Outcome Measure Help needed turning from your back to your side while in a flat bed without using bedrails?: Total Help needed moving from lying on your back to sitting on the side of a flat bed without using bedrails?: Total Help needed moving to and from a bed to a chair (including a wheelchair)?: Total Help needed standing up from a chair using your arms (e.g.,  wheelchair or bedside chair)?: Total Help needed to walk in hospital room?: Total Help needed climbing 3-5 steps with a railing? : Total 6 Click Score: 6    End of Session Equipment Utilized During Treatment: Other (comment);Oxygen (lift) Activity Tolerance: Patient tolerated treatment well Patient left: in chair;with call bell/phone within reach;with family/visitor present Nurse Communication: Mobility status;Need for lift equipment PT Visit Diagnosis: Muscle weakness (generalized) (M62.81)    Time: [id number] PT Time Calculation (min) (ACUTE ONLY): 39 min   Charges:   PT Evaluation $PT Eval Low Complexity: 1 Low PT Treatments $Therapeutic Exercise: 8-22 mins $Therapeutic Activity: 8-22 mins        West Carbo, PT, DPT   Acute Rehabilitation Department Office [phone] Secure Chat Communication Preferred  Sandra Cockayne 05/29/2022, 11:51 AM

## 2022-05-29 NOTE — Evaluation (Signed)
Occupational Therapy Evaluation Patient Details Name: [name] MRN: [id number] DOB: 12-Aug-1922 Today's Date: 05/29/2022   History of Present Illness 89 yo female presenting from facility on 3/28 due to pressure wounds of RLE. PMH includes: PAD, HTN, afib, hip surgery, ORIF of R femur fx 2023.   Clinical Impression   Patient admitted from SNF for diagnosis above.  PTA she was total assist for ADL at bedlevel, and hoyer lift to/from bed.  Patient states she could feed herself, but weakness and limited shoulder ROM call that into question.  No OT needs in the acute setting, recommend bed to recliner with hoyer ad lib.  Recommend a return to her facility once cleared medically.        Recommendations for follow up therapy are one component of a multi-disciplinary discharge planning process, led by the attending physician.  Recommendations may be updated based on patient status, additional functional criteria and insurance authorization.   Assistance Recommended at Discharge Frequent or constant Supervision/Assistance  Patient can return home with the following A lot of help with bathing/dressing/bathroom    Functional Status Assessment  Patient has not had a recent decline in their functional status  Equipment Recommendations  None recommended by OT    Recommendations for Other Services       Precautions / Restrictions Precautions Precaution Comments: significant wounds BLE, significant arthritis Restrictions Weight Bearing Restrictions: No      Mobility Bed Mobility   Bed Mobility: Rolling Rolling: Max assist, +2 for physical assistance              Transfers     Transfers: Bed to chair/wheelchair/BSC             General transfer comment: totalA with maximove bed to chair Transfer via Lift Equipment: [place]                                           ADL either performed or assessed with clinical judgement   ADL  Overall ADL's : At baseline                                       General ADL Comments: total care     Vision Patient Visual Report: No change from baseline       Perception     Praxis      Pertinent Vitals/Pain Pain Assessment Faces Pain Scale: Hurts even more Pain Location: legs and arms with ROM Pain Descriptors / Indicators: Discomfort, Grimacing, Moaning Pain Intervention(s): Monitored during session     Hand Dominance Right   Extremity/Trunk Assessment Upper Extremity Assessment Upper Extremity Assessment: Generalized weakness;RUE deficits/detail;LUE deficits/detail RUE Deficits / Details: limited shoulder ROM LUE Deficits / Details: limited shoulder ROM   Lower Extremity Assessment Lower Extremity Assessment: Defer to PT evaluation   Cervical / Trunk Assessment Cervical / Trunk Assessment: Kyphotic   Communication Communication Communication: HOH   Cognition Arousal/Alertness: Awake/alert Behavior During Therapy: WFL for tasks assessed/performed Overall Cognitive Status: Within Functional Limits for tasks assessed                                       General Comments   VSS  on O2    Exercises     Shoulder Instructions      Home Living Family/patient expects to be discharged to:: Skilled nursing facility                                 Additional Comments: pt is from LTC at [facility]      Prior Functioning/Environment Prior Level of Function : Needs assist             Mobility Comments: hoyer lift to recliner, does not use WC, has not walked for >1 year ADLs Comments: assist at bedlevel with all ADL. Patient states she can feed herself after setup        OT Problem List: Decreased strength;Decreased range of motion      OT Treatment/Interventions:      OT Goals(Current goals can be found in the care plan section) Acute Rehab OT Goals Patient Stated Goal: Return to facility OT Goal  Formulation: With patient Time For Goal Achievement: 06/12/22 Potential to Achieve Goals: Good  OT Frequency:      Co-evaluation              AM-PAC OT "6 Clicks" Daily Activity     Outcome Measure Help from another person eating meals?: A Lot Help from another person taking care of personal grooming?: A Lot Help from another person toileting, which includes using toliet, bedpan, or urinal?: Total Help from another person bathing (including washing, rinsing, drying)?: Total Help from another person to put on and taking off regular upper body clothing?: Total Help from another person to put on and taking off regular lower body clothing?: Total 6 Click Score: 8   End of Session Nurse Communication: Mobility status  Activity Tolerance: Patient tolerated treatment well Patient left: in chair;with call bell/phone within reach;with family/visitor present  OT Visit Diagnosis: Pain Pain - part of body: Arm;Leg                Time: [id number] OT Time Calculation (min): 18 min Charges:  OT General Charges $OT Visit: 1 Visit OT Evaluation $OT Eval Moderate Complexity: 1 Mod  05/29/2022  RP, OTR/L  Acute Rehabilitation Services  Office:  [phone]   Metta Clines 05/29/2022, 5:42 PM

## 2022-05-29 NOTE — Progress Notes (Signed)
Pt is transferred from [facility] by [facility] staff and her care giver named Ms. Cloyde Reams who is Pt's health care power of attorney and her nephew named Mr. Xochilth Bruzek accompany. Pt is alert and oriented x 3, followed commands, afebrile, hemodynamically stable, on 2-3 lpm of O2 NCL, SPO2 100%. No respiratory distress. Sinus brady cardia on the monitor, HR 50s.   Right lower leg open wound chronic ulcer size 4.0 x4.5 x 0.5 cm has minimal drainage, cleaned with NSS and pack with moist to dry gauzes, cover with Tegaderm. Right heel is black eschar size 8.0 x 8.5 cm.-open to air-no drainage. Pt has her Pavulon booths from her previous SNF on both heels. Pain is well tolerated with Tylenol. Pt prefers not to take Oxycodone for her pain.  Plan of care has reviewed with family member. Pt and her heath care power of attorney prefer not to get amputation and request for intensive wound care. We encourage Pt and family members to open discussion with MD vascular team at am.    We will continue to monitor.   Kennyth Lose, RN

## 2022-05-29 NOTE — Progress Notes (Addendum)
PROGRESS NOTE    [name]  [id number] DOB: Jan 09, 1923 DOA: 05/28/2022 PCP: Garwin Brothers, MD    Chief Complaint  Patient presents with   Wound Check    Brief Narrative:   [name] is a 89 y.o. female with medical history significant for peripheral arterial disease, hypertension, atrial fibrillation not on rate control or anticoagulation, being admitted to the hospital due to right lower extremity chronic ulceration.  Patient has known peripheral arterial disease, has been followed by vascular surgery.  She has a history of this chronic right heel ulcer, saw vascular surgery Dr. Unk Lightning a couple of weeks ago as an outpatient, he states that she is not a surgical candidate, especially due to being nonambulatory since she had a fracture last year of the right femur.  Transferred from [facility] to [facility] for evaluation by vascular surgery.  Assessment & Plan:   Principal Problem:   Leg ulcer, left (HCC) Active Problems:   Atrial fibrillation (HCC)   Diastolic heart failure (HCC)   Edema   Bedbound   GERD (gastroesophageal reflux disease)   Nonhealing right leg wound/ulcer, -Patient with progressive worsening of her right lower extremity/ankle wound, with surrounding erythema . -Continue with IV antibiotics adding IV vancomycin and Zosyn -Follow-up blood cultures -Wound care consulted, continue with local wound care. -Vascular surgery consulted regarding further recommendation.  Atrial fibrillation ([place]) - she does not appear to be on anticoagulation as she is high risk especially with falls.   Chronic Diastolic heart failure (HCC)  -continue Lasix, no evidence of exacerbation  Bedbound - will consult PT and OT   GERD (gastroesophageal reflux disease)  -continue p.o. PPI  Goals Of care -Have discussed with patient, and family members at bedside regarding goals of care, discussed with them about consulting palliative care to address goals of care and  CODE STATUS, and they are agreeable.       DVT prophylaxis: Lovenox  Code Status: Full Family Communication: D/W nephew at bedside. Disposition:   Status is: Inpatient    Consultants:  Vascular surgery Palliative medicine   Subjective:  No significant events overnight, she is hard of hearing, but denies any complaints currently.  Objective: Vitals:   05/29/22 0358 05/29/22 0813 05/29/22 0907 05/29/22 1137  BP: (!) 139/49 137/73 137/73 (!) 130/58  Pulse: (!) 57 (!) 58 (!) 57 (!) 58  Resp: 20 17 (!) 22 19  Temp: 98.2 F (36.8 C) 97.7 F (36.5 C)  (!) 96.1 F (35.6 C)  TempSrc: Oral Oral  Axillary  SpO2: 100% 100% 100% 99%  Weight: 87.3 kg     Height:        Intake/Output Summary (Last 24 hours) at 05/29/2022 1249 Last data filed at 05/29/2022 1000 Gross per 24 hour  Intake 1613.19 ml  Output 1100 ml  Net 513.19 ml   Filed Weights   05/29/22 0032 05/29/22 0358  Weight: 88.9 kg 87.3 kg    Examination:  Awake Alert, Oriented X 3, hard of hearing, but otherwise appropriate and coherent, frail Symmetrical Chest wall movement, Good air movement bilaterally, CTAB RRR,No Gallops,Rubs or new Murmurs, No Parasternal Heave +ve B.Sounds, Abd Soft, No tenderness, No rebound - guarding or rigidity. Both extremities on floating boots, right lower extremity wound, please see picture below      Data Reviewed: I have personally reviewed following labs and imaging studies  CBC: Recent Labs  Lab 05/28/22 1544 05/29/22 0112  WBC 13.2* 13.0*  NEUTROABS 9.4*  --  HGB 11.8* 11.4*  HCT 37.2 34.3*  MCV 95.9 93.0  PLT 348 [id number]    Basic Metabolic Panel: Recent Labs  Lab 05/28/22 1544 05/29/22 0112  NA 138 136  K 4.0 3.7  CL 101 100  CO2 29 26  GLUCOSE 112* 108*  BUN 27* 20  CREATININE 0.59 0.60  CALCIUM 8.4* 8.4*    GFR: Estimated Creatinine Clearance: 46.5 mL/min (by C-G formula based on SCr of 0.6 mg/dL).  Liver Function Tests: No results for input(s):  "AST", "ALT", "ALKPHOS", "BILITOT", "PROT", "ALBUMIN" in the last 168 hours.  CBG: No results for input(s): "GLUCAP" in the last 168 hours.   Recent Results (from the past 240 hour(s))  Culture, blood (Routine X 2) w Reflex to ID Panel     Status: None (Preliminary result)   Collection Time: 05/28/22  3:44 PM   Specimen: Left Antecubital; Blood  Result Value Ref Range Status   Specimen Description   Final    LEFT ANTECUBITAL BLOOD Performed at [facility], [address] [place], [place] [address]    Special Requests   Final    BOTTLES DRAWN AEROBIC AND ANAEROBIC Blood Culture adequate volume Performed at [facility] [address] [place], [place] [address]    Culture   Final    NO GROWTH < 12 HOURS Performed at [facility] [address] [place], [place] [address]    Report Status PENDING  Incomplete  Culture, blood (Routine X 2) w Reflex to ID Panel     Status: None (Preliminary result)   Collection Time: 05/28/22  3:44 PM   Specimen: BLOOD LEFT FOREARM  Result Value Ref Range Status   Specimen Description   Final    BLOOD LEFT FOREARM Performed at [facility] [address] [place], [place] [address]    Special Requests   Final    BOTTLES DRAWN AEROBIC AND ANAEROBIC Blood Culture adequate volume Performed at [facility] [address], [place], [place] [address]    Culture   Final    NO GROWTH < 12 HOURS Performed at [facility] [address] [place] [address] [place], [place] [address]    Report Status PENDING  Incomplete         Radiology Studies: DG Foot Complete Right  Result Date: 05/28/2022 CLINICAL DATA:  Infection, pressure ulcers EXAM: RIGHT FOOT COMPLETE - 3+ VIEW; RIGHT ANKLE - COMPLETE 3+ VIEW COMPARISON:  None Available. FINDINGS: Fracture or dislocation of the right foot or ankle. Disuse osteopenia. Moderate right first metatarsophalangeal arthrosis with otherwise mild  arthrosis about the foot and ankle. Diffuse soft tissue edema, particularly about the forefoot. Probable soft tissue wound of the posterior heel. Vascular calcinosis. IMPRESSION: 1. No fracture or dislocation of the right foot or ankle. Disuse osteopenia. 2. Diffuse soft tissue edema, particularly about the forefoot. Probable soft tissue wound of the posterior heel. No radiographic findings to suggest osteomyelitis. MRI is the most sensitive test for the detection of bone marrow edema and osteomyelitis if suspected. Electronically Signed   By: Delanna Ahmadi M.D.   On: 05/28/2022 14:33   DG Ankle Complete Right  Result Date: 05/28/2022 CLINICAL DATA:  Infection, pressure ulcers EXAM: RIGHT FOOT COMPLETE - 3+ VIEW; RIGHT ANKLE - COMPLETE 3+ VIEW COMPARISON:  None Available. FINDINGS: Fracture or dislocation of the right foot or ankle. Disuse osteopenia. Moderate right first metatarsophalangeal arthrosis with otherwise mild arthrosis about the foot and  ankle. Diffuse soft tissue edema, particularly about the forefoot. Probable soft tissue wound of the posterior heel. Vascular calcinosis. IMPRESSION: 1. No fracture or dislocation of the right foot or ankle. Disuse osteopenia. 2. Diffuse soft tissue edema, particularly about the forefoot. Probable soft tissue wound of the posterior heel. No radiographic findings to suggest osteomyelitis. MRI is the most sensitive test for the detection of bone marrow edema and osteomyelitis if suspected. Electronically Signed   By: Delanna Ahmadi M.D.   On: 05/28/2022 14:33        Scheduled Meds:  enoxaparin (LOVENOX) injection  40 mg Subcutaneous Q24H   feeding supplement  237 mL Oral BID BM   furosemide  40 mg Oral Daily   levothyroxine  50 mcg Oral Daily   mometasone-formoterol  2 puff Inhalation BID   pantoprazole  40 mg Oral Daily   polyethylene glycol  17 g Oral Daily   Continuous Infusions:  ceFEPime (MAXIPIME) IV 2 g (05/29/22 0511)   lactated ringers 125  mL/hr at 05/29/22 1036   vancomycin 1,000 mg (05/29/22 0605)     LOS: 1 day       Phillips Climes, MD [facility]   To contact the attending provider between 7A-7P or the covering provider during after hours 7P-7A, please log into the web site [email or web address] and access using universal [place] password for that web site. If you do not have the password, please call the hospital operator.  05/29/2022, 12:49 PM

## 2022-05-29 NOTE — Consult Note (Signed)
Vascular and Vein Specialist of [place]  Patient name: [name] MRN: [id number] DOB: 10/30/1922 Sex: female   REQUESTING PROVIDER:   Hospital service   REASON FOR CONSULT:    Right heel ulcer  HISTORY OF PRESENT ILLNESS:   [name] is a 89 y.o. female, who we have been asked to evaluate for a right heel ulcer.  The patient was seen by Dr. Virl Cagey on 05/15/2022 for the same issue.  She is a nonoperative candidate due to her age as well as the fact that she has been nonambulatory for 20 months.  She underwent a right distal femur fracture repair on 08/08/2021.  She has been receiving wound care at her nursing home.  She has developed worsening erythema as well as discomfort in her right leg which occasionally keeps her up at night.  It was discussed with her at her clinic visit that her only option would be a above-knee amputation, which she is trying to avoid.  She is currently on IV antibiotics.  She has been able to heal a left heel ulcer.  Her wounds are clearly pressure related  The patient is on Eliquis for atrial fibrillation.  She has diastolic heart failure as well as gastroesophageal reflux disease.  PAST MEDICAL HISTORY    Past Medical History:  Diagnosis Date   Atrial fibrillation ([place])    CARCINOMA, BREAST    CHEST PAIN    DEGENERATIVE JOINT DISEASE    Diastolic heart failure ([place]) 08/03/2008   Qualifier: Diagnosis of  By: Burnett Kanaris     Difficult intubation    Edema    GIB (gastrointestinal bleeding) 05/24/2015   Humerus fracture 09/12/2020   HYPERTENSION    Long term (current) use of anticoagulants    Malignant neoplasm of female breast ([place]) 08/03/2008   Qualifier: Diagnosis of  By: Burnett Kanaris     Unspecified diastolic heart failure      FAMILY HISTORY   Family History  Problem Relation Age of Onset   CAD Mother    Heart attack Mother    Hypertension Mother    CAD Father    Stroke  Brother    Heart attack Brother     SOCIAL HISTORY:   Social History   Socioeconomic History   Marital status: Single    Spouse name: Not on file   Number of children: Not on file   Years of education: Not on file   Highest education level: Not on file  Occupational History   Not on file  Tobacco Use   Smoking status: Never   Smokeless tobacco: Never  Vaping Use   Vaping Use: Never used  Substance and Sexual Activity   Alcohol use: No   Drug use: No   Sexual activity: Not on file  Other Topics Concern   Not on file  Social History Narrative   Not on file   Social Determinants of Health   Financial Resource Strain: Not on file  Food Insecurity: No Food Insecurity (05/29/2022)   Hunger Vital Sign    Worried About Running Out of Food in the Last Year: Never true    Ran Out of Food in the Last Year: Never true  Transportation Needs: No Transportation Needs (05/29/2022)   PRAPARE - Hydrologist (Medical): No    Lack of Transportation (Non-Medical): No  Physical Activity: Not on file  Stress: Not on file  Social Connections: Not on file  Intimate Partner  Violence: Not At Risk (05/29/2022)   Humiliation, Afraid, Rape, and Kick questionnaire    Fear of Current or Ex-Partner: No    Emotionally Abused: No    Physically Abused: No    Sexually Abused: No    ALLERGIES:    Allergies  Allergen Reactions   Morphine And Related Other (See Comments)    Other Reaction: mild Intolerance, Allergy   Sulfa Antibiotics     Other reaction(s): Other (See Comments) Other Reaction: mild Intolerance, Allergy   Meperidine Hcl Nausea Only   Nsaids     Other reaction(s): Other (See Comments) Bleeding ulcer   Tolmetin Other (See Comments)    Other reaction(s): Other (See Comments) Bleeding ulcer    CURRENT MEDICATIONS:    Current Facility-Administered Medications  Medication Dose Route Frequency Provider Last Rate Last Admin   acetaminophen  (TYLENOL) tablet 650 mg  650 mg Oral Q6H PRN Hollice Gong, Mir M, MD   650 mg at 05/29/22 0018   Or   acetaminophen (TYLENOL) suppository 650 mg  650 mg Rectal Q6H PRN Hollice Gong, Mir M, MD       calcium-vitamin D (OSCAL WITH D) 500-5 MG-MCG per tablet 1 tablet  1 tablet Oral Daily Elgergawy, Silver Huguenin, MD   1 tablet at 05/29/22 1800   ceFEPIme (MAXIPIME) 2 g in sodium chloride 0.9 % 100 mL IVPB  2 g Intravenous Q12H Hollice Gong, Mir M, MD 200 mL/hr at 05/29/22 1805 2 g at 05/29/22 1805   cyclobenzaprine (FLEXERIL) tablet 5 mg  5 mg Oral TID PRN Elgergawy, Silver Huguenin, MD       docusate sodium (COLACE) capsule 100 mg  100 mg Oral Daily PRN Hollice Gong, Mir M, MD       enoxaparin (LOVENOX) injection 40 mg  40 mg Subcutaneous Q24H Hollice Gong, Mir M, MD   40 mg at 05/29/22 0017   feeding supplement (ENSURE ENLIVE / ENSURE PLUS) liquid 237 mL  237 mL Oral TID BM Elgergawy, Silver Huguenin, MD       furosemide (LASIX) tablet 40 mg  40 mg Oral Daily Hollice Gong, Mir M, MD   40 mg at 05/29/22 [id number]   hydrALAZINE (APRESOLINE) injection 10 mg  10 mg Intravenous Q6H PRN Hollice Gong, Mir M, MD       ipratropium-albuterol (DUONEB) 0.5-2.5 (3) MG/3ML nebulizer solution 3 mL  3 mL Nebulization Q6H PRN Elgergawy, Silver Huguenin, MD       lactated ringers infusion   Intravenous Continuous Lacretia Leigh, MD 125 mL/hr at 05/29/22 1840 Infusion Verify at 05/29/22 1840   levothyroxine (SYNTHROID) tablet 50 mcg  50 mcg Oral Daily Hollice Gong, Mir M, MD   50 mcg at 05/29/22 0604   mometasone-formoterol (DULERA) 200-5 MCG/ACT inhaler 2 puff  2 puff Inhalation BID Hollice Gong, Mir M, MD   2 puff at 05/29/22 0907   ondansetron (ZOFRAN) tablet 4 mg  4 mg Oral Q6H PRN Hollice Gong, Mir M, MD       Or   ondansetron [facility]) injection 4 mg  4 mg Intravenous Q6H PRN Hollice Gong, Mir M, MD       Oral care mouth rinse  15 mL Mouth Rinse PRN Hollice Gong, Mir M, MD       oxyCODONE (Oxy IR/ROXICODONE) immediate release tablet 5 mg  5 mg Oral Q4H PRN  Hollice Gong, Mir M, MD       pantoprazole (PROTONIX) EC tablet 40 mg  40 mg Oral Daily Hollice Gong, Mir M, MD   40 mg at 05/29/22 [phone]  polyethylene glycol (MIRALAX / GLYCOLAX) packet 17 g  17 g Oral Daily Hollice Gong, Mir M, MD       vancomycin (VANCOCIN) IVPB 1000 mg/200 mL premix  1,000 mg Intravenous Q24H Polly Cobia, RPH 200 mL/hr at 05/29/22 [id number] 1,000 mg at 05/29/22 0605    REVIEW OF SYSTEMS:   [X]  denotes positive finding, [ ]  denotes negative finding Cardiac  Comments:  Chest pain or chest pressure:    Shortness of breath upon exertion:    Short of breath when lying flat:    Irregular heart rhythm:        Vascular    Pain in calf, thigh, or hip brought on by ambulation:    Pain in feet at night that wakes you up from your sleep:  x   Blood clot in your veins:    Leg swelling:         Pulmonary    Oxygen at home:    Productive cough:     Wheezing:         Neurologic    Sudden weakness in arms or legs:     Sudden numbness in arms or legs:     Sudden onset of difficulty speaking or slurred speech:    Temporary loss of vision in one eye:     Problems with dizziness:         Gastrointestinal    Blood in stool:      Vomited blood:         Genitourinary    Burning when urinating:     Blood in urine:        Psychiatric    Major depression:         Hematologic    Bleeding problems:    Problems with blood clotting too easily:        Skin    Rashes or ulcers: x       Constitutional    Fever or chills:     PHYSICAL EXAM:   Vitals:   05/29/22 0813 05/29/22 0907 05/29/22 1137 05/29/22 1700  BP: 137/73 137/73 (!) 130/58 (!) 145/76  Pulse: (!) 58 (!) 57 (!) 58 63  Resp: 17 (!) 22 19 20   Temp: 97.7 F (36.5 C)  (!) 96.1 F (35.6 C) (!) 97.1 F (36.2 C)  TempSrc: Oral  Axillary Axillary  SpO2: 100% 100% 99% 100%  Weight:      Height:        GENERAL: The patient is a well-nourished female, in no acute distress. The vital signs are documented  above. CARDIAC: There is a regular rate and rhythm.  VASCULAR: Nonpalpable pedal pulses PULMONARY: Nonlabored respirations ABDOMEN: Soft and non-tender with normal pitched bowel sounds.  MUSCULOSKELETAL: There are no major deformities or cyanosis. NEUROLOGIC: No focal weakness or paresthesias are detected. SKIN: See photo below. PSYCHIATRIC: The patient has a normal affect.     STUDIES:   Right foot x-ray: 1. No fracture or dislocation of the right foot or ankle. Disuse osteopenia. 2. Diffuse soft tissue edema, particularly about the forefoot. Probable soft tissue wound of the posterior heel. No radiographic findings to suggest osteomyelitis. MRI is the most sensitive test for the detection of bone marrow edema and osteomyelitis if suspected.  ASSESSMENT and PLAN   Right heel ulcer: She has extensive soft tissue damage.  Even with revascularization I do not think this is a salvageable extremity.  Given her immobility, as well as her age, I think her only  option would be an above-knee amputation.  This was discussed with her caregiver as well as her nephew.  At this time they would like to avoid amputation.  I will continue to have discussions with them over the weekend.  In the meantime continue IV antibiotics and offloading of her heels.   Leia Alf, MD, FACS [facility] and Vein Specialists of [facility] [phone] Pager [phone]

## 2022-05-29 NOTE — Consult Note (Signed)
WOC Nurse Consult Note: Reason for Consult: right ankle wound However to be noted to be right heel, right posterior tibial and right medial malleolar. She has been seen by VVS and WCC before, chronic non healing pressure injury and the presence of vascular disease. VVS has recommended an amputation, she is pending consultation for with VVS inpatient  Wound type:  Unstageable Pressure injury right heel Deep Tissue Pressure Injury right distal pretibial with superficial partial thickness skin loss Deep Tissue Pressure Injury vs arterial ulcer right medial malleolar   Pressure Injury POA: Yes Measurement: see nursing flow sheets Wound bed: Heel: 90% non viable, dark purple non blanchable circumferential tissue, suspect palpable bone. Xray neg. Right medial malleolar 100% intact, purple and serous filled blistering  Right distal pretibial linear dark purple area with skin loss  Drainage (amount, consistency, odor) minimal per nursing notes Periwound: intact  Dressing procedure/placement/frequency: Palliative dressing orders; single layer of xeroform gauze, top with dry dressings. Secure with kerlix.  Pending VVS consultation. Prevalon boots to offload heel at all times.    Re consult if needed, will not follow at this time. Thanks  Caoimhe Damron [employer], RN,CWOCN, CNS, [place] [phone])

## 2022-05-30 DIAGNOSIS — Z7401 Bed confinement status: Secondary | ICD-10-CM

## 2022-05-30 DIAGNOSIS — I482 Chronic atrial fibrillation, unspecified: Secondary | ICD-10-CM | POA: Diagnosis not present

## 2022-05-30 DIAGNOSIS — L97924 Non-pressure chronic ulcer of unspecified part of left lower leg with necrosis of bone: Secondary | ICD-10-CM | POA: Diagnosis not present

## 2022-05-30 DIAGNOSIS — I739 Peripheral vascular disease, unspecified: Secondary | ICD-10-CM

## 2022-05-30 DIAGNOSIS — I5032 Chronic diastolic (congestive) heart failure: Secondary | ICD-10-CM

## 2022-05-30 DIAGNOSIS — Z515 Encounter for palliative care: Secondary | ICD-10-CM

## 2022-05-30 DIAGNOSIS — Z7189 Other specified counseling: Secondary | ICD-10-CM

## 2022-05-30 LAB — PHOSPHORUS: Phosphorus: 2.7 mg/dL (ref 2.5–4.6)

## 2022-05-30 LAB — BASIC METABOLIC PANEL
Anion gap: 11 (ref 5–15)
BUN: 20 mg/dL (ref 8–23)
CO2: 28 mmol/L (ref 22–32)
Calcium: 8.4 mg/dL — ABNORMAL LOW (ref 8.9–10.3)
Chloride: 98 mmol/L (ref 98–111)
Creatinine, Ser: 0.8 mg/dL (ref 0.44–1.00)
GFR, Estimated: >60 mL/min (ref >60)
Glucose, Bld: 108 mg/dL — ABNORMAL HIGH (ref 70–99)
Potassium: 3.5 mmol/L (ref 3.5–5.1)
Sodium: 137 mmol/L (ref 135–145)

## 2022-05-30 LAB — CBC
HCT: 33.8 % — ABNORMAL LOW (ref 36.0–46.0)
Hemoglobin: 11.4 g/dL — ABNORMAL LOW (ref 12.0–15.0)
MCH: 31.1 pg (ref 26.0–34.0)
MCHC: 33.7 g/dL (ref 30.0–36.0)
MCV: 92.1 fL (ref 80.0–100.0)
Platelets: 350 10*3/uL (ref 150–400)
RBC: 3.67 MIL/uL — ABNORMAL LOW (ref 3.87–5.11)
RDW: 14.3 % (ref 11.5–15.5)
WBC: 13.9 10*3/uL — ABNORMAL HIGH (ref 4.0–10.5)
nRBC: 0 % (ref 0.0–0.2)

## 2022-05-30 NOTE — Progress Notes (Signed)
PROGRESS NOTE    [name]  [id number] DOB: 16-Sep-1922 DOA: 05/28/2022 PCP: Garwin Brothers, MD    Chief Complaint  Patient presents with   Wound Check    Brief Narrative:   [name] is a 89 y.o. female with medical history significant for peripheral arterial disease, hypertension, atrial fibrillation not on rate control or anticoagulation, being admitted to the hospital due to right lower extremity chronic ulceration.  Patient has known peripheral arterial disease, has been followed by vascular surgery.  She has a history of this chronic right heel ulcer, saw vascular surgery Dr. Unk Lightning a couple of weeks ago as an outpatient, he states that she is not a surgical candidate, especially due to being nonambulatory since she had a fracture last year of the right femur.  Transferred from [place] to [facility] for evaluation by vascular surgery.  Assessment & Plan:   Principal Problem:   Leg ulcer, left (HCC) Active Problems:   Atrial fibrillation (HCC)   Diastolic heart failure (HCC)   Edema   Bedbound   GERD (gastroesophageal reflux disease)   Nonhealing right leg wound/ulcer, -Patient with progressive worsening of her right lower extremity/ankle wound, with surrounding erythema . -Continue with IV vancomycin and Zosyn -Follow-up blood cultures -Wound care consulted, continue with local wound care. -Vascular surgery consulted regarding further recommendation.  Atrial fibrillation ([place]) - she does not appear to be on anticoagulation as she is high risk especially with falls.   Chronic Diastolic heart failure (HCC)  -continue Lasix, no evidence of exacerbation Stop IVF  Bedbound - will consult PT and OT   GERD (gastroesophageal reflux disease)  -continue p.o. PPI  Goals Of care -Have discussed with patient, and family members at bedside regarding goals of care, discussed with them about consulting palliative care to address goals of care and CODE STATUS,  and they are agreeable.       DVT prophylaxis: Lovenox  Code Status: Full Family Communication: D/W nephew at bedside. Disposition:   Status is: Inpatient    Consultants:  Vascular surgery Palliative medicine   Subjective:  No significant events overnight, she is hard of hearing, but denies any complaints currently.  Plastic surgery saw recommended amputation which patient.  Labs this morning showed worsening leukocytosis 13.9, labs otherwise stable.   Objective: Vitals:   05/30/22 0052 05/30/22 0438 05/30/22 0752 05/30/22 0818  BP: 115/60 (!) 110/38  101/63  Pulse: (!) 58 (!) 57  (!) 58  Resp: 17 19  20   Temp: 98.8 F (37.1 C) 98.2 F (36.8 C)  (!) 97.3 F (36.3 C)  TempSrc: Axillary Axillary  Oral  SpO2: 99% 100% 100% 99%  Weight:  87.4 kg    Height:        Intake/Output Summary (Last 24 hours) at 05/30/2022 1151 Last data filed at 05/30/2022 0441 Gross per 24 hour  Intake 1038.68 ml  Output 400 ml  Net 638.68 ml    Filed Weights   05/29/22 0032 05/29/22 0358 05/30/22 0438  Weight: 88.9 kg 87.3 kg 87.4 kg    Examination:  Physical Exam Vitals reviewed.  Constitutional:      Appearance: She is normal weight.  HENT:     Head: Atraumatic.     Nose: Nose normal.     Mouth/Throat:     Mouth: Mucous membranes are moist.     Pharynx: Oropharynx is clear.  Eyes:     Pupils: Pupils are equal, round, and reactive to light.  Cardiovascular:  Rate and Rhythm: Normal rate.     Pulses: Normal pulses.     Heart sounds: Normal heart sounds.  Pulmonary:     Effort: Pulmonary effort is normal.     Breath sounds: Normal breath sounds.  Abdominal:     General: Abdomen is flat. Bowel sounds are normal.  Musculoskeletal:        General: Signs of injury present.  Skin:    General: Skin is warm.  Neurological:     Mental Status: She is alert. Mental status is at baseline.          Data Reviewed: I have personally reviewed following labs and imaging  studies  CBC: Recent Labs  Lab 05/28/22 1544 05/29/22 0112 05/30/22 0114  WBC 13.2* 13.0* 13.9*  NEUTROABS 9.4*  --   --   HGB 11.8* 11.4* 11.4*  HCT 37.2 34.3* 33.8*  MCV 95.9 93.0 92.1  PLT 348 317 350     Basic Metabolic Panel: Recent Labs  Lab 05/28/22 1544 05/29/22 0112 05/30/22 0114  NA 138 136 137  K 4.0 3.7 3.5  CL 101 100 98  CO2 29 26 28   GLUCOSE 112* 108* 108*  BUN 27* 20 20  CREATININE 0.59 0.60 0.80  CALCIUM 8.4* 8.4* 8.4*  PHOS  --   --  2.7     GFR: Estimated Creatinine Clearance: 46.5 mL/min (by C-G formula based on SCr of 0.8 mg/dL).  Liver Function Tests: No results for input(s): "AST", "ALT", "ALKPHOS", "BILITOT", "PROT", "ALBUMIN" in the last 168 hours.  CBG: No results for input(s): "GLUCAP" in the last 168 hours.   Recent Results (from the past 240 hour(s))  Culture, blood (Routine X 2) w Reflex to ID Panel     Status: None (Preliminary result)   Collection Time: 05/28/22  3:44 PM   Specimen: Left Antecubital; Blood  Result Value Ref Range Status   Specimen Description   Final    LEFT ANTECUBITAL BLOOD Performed at [facility], [address], [place], [place] [address]    Special Requests   Final    BOTTLES DRAWN AEROBIC AND ANAEROBIC Blood Culture adequate volume Performed at [facility] [address] [place], [place] [address]    Culture   Final    NO GROWTH 2 DAYS Performed at [facility] [address], [place], [place] [address]    Report Status PENDING  Incomplete  Culture, blood (Routine X 2) w Reflex to ID Panel     Status: None (Preliminary result)   Collection Time: 05/28/22  3:44 PM   Specimen: BLOOD LEFT FOREARM  Result Value Ref Range Status   Specimen Description   Final    BLOOD LEFT FOREARM Performed at [facility] [address], [place], [place] [address]    Special Requests   Final    BOTTLES DRAWN AEROBIC AND ANAEROBIC Blood Culture adequate  volume Performed at [facility] [address] [place], [place] [address]    Culture   Final    NO GROWTH 2 DAYS Performed at [facility] [address] [place], [place] [address]    Report Status PENDING  Incomplete         Radiology Studies: DG Foot Complete Right  Result Date: 05/28/2022 CLINICAL DATA:  Infection, pressure ulcers EXAM: RIGHT FOOT COMPLETE - 3+ VIEW; RIGHT ANKLE - COMPLETE 3+ VIEW COMPARISON:  None Available. FINDINGS: Fracture or dislocation of the  right foot or ankle. Disuse osteopenia. Moderate right first metatarsophalangeal arthrosis with otherwise mild arthrosis about the foot and ankle. Diffuse soft tissue edema, particularly about the forefoot. Probable soft tissue wound of the posterior heel. Vascular calcinosis. IMPRESSION: 1. No fracture or dislocation of the right foot or ankle. Disuse osteopenia. 2. Diffuse soft tissue edema, particularly about the forefoot. Probable soft tissue wound of the posterior heel. No radiographic findings to suggest osteomyelitis. MRI is the most sensitive test for the detection of bone marrow edema and osteomyelitis if suspected. Electronically Signed   By: Delanna Ahmadi M.D.   On: 05/28/2022 14:33   DG Ankle Complete Right  Result Date: 05/28/2022 CLINICAL DATA:  Infection, pressure ulcers EXAM: RIGHT FOOT COMPLETE - 3+ VIEW; RIGHT ANKLE - COMPLETE 3+ VIEW COMPARISON:  None Available. FINDINGS: Fracture or dislocation of the right foot or ankle. Disuse osteopenia. Moderate right first metatarsophalangeal arthrosis with otherwise mild arthrosis about the foot and ankle. Diffuse soft tissue edema, particularly about the forefoot. Probable soft tissue wound of the posterior heel. Vascular calcinosis. IMPRESSION: 1. No fracture or dislocation of the right foot or ankle. Disuse osteopenia. 2. Diffuse soft tissue edema, particularly about the forefoot. Probable soft tissue wound of the posterior heel. No  radiographic findings to suggest osteomyelitis. MRI is the most sensitive test for the detection of bone marrow edema and osteomyelitis if suspected. Electronically Signed   By: Delanna Ahmadi M.D.   On: 05/28/2022 14:33        Scheduled Meds:  calcium-vitamin D  1 tablet Oral Daily   enoxaparin (LOVENOX) injection  40 mg Subcutaneous Q24H   feeding supplement  237 mL Oral TID BM   furosemide  40 mg Oral Daily   levothyroxine  50 mcg Oral Daily   mometasone-formoterol  2 puff Inhalation BID   pantoprazole  40 mg Oral Daily   polyethylene glycol  17 g Oral Daily   Continuous Infusions:  ceFEPime (MAXIPIME) IV 2 g (05/30/22 0618)   lactated ringers 125 mL/hr at 05/30/22 0908   vancomycin 1,000 mg (05/30/22 0653)     LOS: 2 days       Emilee Hero, MD [facility]   To contact the attending provider between 7A-7P or the covering provider during after hours 7P-7A, please log into the web site [email or web address] and access using universal [place] password for that web site. If you do not have the password, please call the hospital operator.  05/30/2022, 11:51 AM

## 2022-05-30 NOTE — Progress Notes (Signed)
Mobility Specialist Progress Note    05/30/22 1023  Mobility  Activity Transferred from bed to chair  Level of Assistance +2 (takes two people)  Assistive Device MaxiMove  Activity Response Tolerated well  Mobility Referral Yes  $Mobility charge 1 Mobility   Pre-Mobility: 55 HR, 93% SpO2 During Mobility: 67 HR Post-Mobility: 61 HR, 95% SpO2  Pt received and agreeable. C/o pain with movement of extremities. Left with NT present.   Hildred Alamin Mobility Specialist  Please Psychologist, sport and exercise or Rehab Office at [phone]

## 2022-05-30 NOTE — Consult Note (Signed)
Palliative Care Consult Note                                  Date: 05/30/2022   Patient Name: [name]  DOB: August 04, 1922  MRN: [id number]  Age / Sex: 89 y.o., female  PCP: Garwin Brothers, MD Referring Physician: Emilee Hero, MD  Reason for Consultation: Establishing goals of care  HPI/Patient Profile: 89 y.o. female  with past medical history of peripheral artery disease, chronic diastolic heart failure, atrial fibrillation not on anticoagulation, and hypertension who was admitted to [facility] on 05/28/2022 with chronic right lower extremity ulcer.  She was seen by vascular surgery 3/29, and has been recommended for above-the-knee amputation.  Palliative medicine has been consulted for goals of care  Past Medical History:  Diagnosis Date   Atrial fibrillation ([place])    CARCINOMA, BREAST    CHEST PAIN    DEGENERATIVE JOINT DISEASE    Diastolic heart failure ([place]) 08/03/2008   Qualifier: Diagnosis of  By: Burnett Kanaris     Difficult intubation    Edema    GIB (gastrointestinal bleeding) 05/24/2015   Humerus fracture 09/12/2020   HYPERTENSION    Long term (current) use of anticoagulants    Malignant neoplasm of female breast ([place]) 08/03/2008   Qualifier: Diagnosis of  By: Burnett Kanaris     Unspecified diastolic heart failure     Subjective:   I have reviewed medical records including progress notes, labs and imaging, and assessed the patient at bedside. She is OOB to the recliner. She reports right foot/knee pain. She does not wish to discuss [place] without her nephew and friend present.  She does state "I am 52 years old and I do not think I have long left to live".  I spoke with her nephew/Jonathan by phone to discuss diagnosis, prognosis, GOC, EOL wishes, disposition, and options.  I introduced Palliative Medicine as specialized medical care for people living with serious illness. It focuses on providing relief from the  symptoms and stress of a serious illness.   A brief life review was discussed.Patient never married but had a long-term significant other who is now deceased. She does not have children. She has resided at [facility] since July 2022. Prior to that, patient lived at home and was fairly functional per nephew. She fell and fractured her right arm, was hospitalized 09/12/20 - 09/24/20, and ultimately discharged to SNF/rehab. She has been non-ambulatory since then and thus unable to return home.   We discussed patient's current illness and what it means in the larger context of her ongoing co-morbidities. Current clinical status was reviewed. Natural disease trajectory of chronic illness was discussed.  Discussed recommendations per vascular surgery - that RLE is salvageable and she will likely need an above-knee amputation.  Nephew confirms that they would like to avoid amputation at this time.  I shared my concern that in the setting of severe peripheral vascular disease, her condition will worsen over time.  Nephew shares they are hopeful that her condition will improve to some degree or at least stabilize with supportive care such as antibiotics and offloading.  We did discuss code status. Encouraged consideration DNR/DNI status understanding evidenced based poor outcomes in similar hospitalized patients, as the cause of the arrest is likely associated with chronic/terminal disease rather than a reversible acute cardio-pulmonary event. I explained that DNR/DNI does not change the medical plan and it only  comes into effect after a person has arrested (died).    Nephew understands prognosis would be poor in the event of a resuscitation event, and also understands that resuscitation efforts can be "brutal". However, he is clear in desire to continue full code status as this has been patient's previously stated wish. I shared my concern that patient may not fully understand what full code status entails. Nephew is  adamant that patient is very sharp and able to make her decisions independently.    Discussed the importance of continued conversation with patient, family, and the medical team regarding overall plan of care and treatment options.  Addendum: I later returned to the room to leave information (MOST form, hard choices book, and PMT contact info) at bedside for nephew. I attempted again to discuss code status, and patient states "I'm not ready to leave". But she also states that she "will be with God" by tomorrow. Outside the room, RN shares concerns that patient has intermittent confusion. .    Advanced Directives: HCPOA document on file in EMR listing Jullissa Rothweiler (nephew) as health care agent and Cloyde Reams (friend) as alternate.   Review of Systems  Musculoskeletal:        Right foot and knee pain    Objective:   Primary Diagnoses: Present on Admission:  Leg ulcer, left (HCC)  Atrial fibrillation (HCC)  Diastolic heart failure ([place])  Edema   Physical Exam Vitals reviewed.  Constitutional:      General: She is not in acute distress.    Comments: Frail, chronically ill-appearing  HENT:     Head:     Comments: Hard of hearing Pulmonary:     Effort: Pulmonary effort is normal.  Skin:    Comments: RLE ulcer  Neurological:     Mental Status: She is alert and oriented to person, place, and time.     Vital Signs:  BP 130/65 (BP Location: Left Wrist)   Pulse (!) 58   Temp (!) 97.3 F (36.3 C) (Oral)   Resp 16   Ht 5\' 8"  (1.727 m)   Wt 87.4 kg   SpO2 97%   BMI 29.30 kg/m   Palliative Assessment/Data: PPS 30-40%     Assessment & Plan:   SUMMARY OF RECOMMENDATIONS   Continue current supportive care Continue full code status Will reach out to nephew tomorrow for possible in-person meeting PMT will continue to support as needed  Primary Decision Maker: Unclear whether patient understands her current situation   Symptom Management:  Oxycodone IR 5 mg  every 4 hours as needed for pain  Prognosis:  Unable to determine, she is high risk for decompensation  Discharge Planning:  Return to SNF     Thank you for allowing the Palliative Medicine Team to assist in the care of this patient.   Greater than 50%  of this time was spent counseling and coordinating care related to the above assessment and plan.  Total time: 90 minutes   Lavena Bullion, NP Palliative Medicine   Please contact Palliative Medicine Team phone at [phone] for questions and concerns.  For individual provider, see AMION.

## 2022-05-31 DIAGNOSIS — Z7401 Bed confinement status: Secondary | ICD-10-CM | POA: Diagnosis not present

## 2022-05-31 DIAGNOSIS — L97509 Non-pressure chronic ulcer of other part of unspecified foot with unspecified severity: Secondary | ICD-10-CM

## 2022-05-31 DIAGNOSIS — I7025 Atherosclerosis of native arteries of other extremities with ulceration: Secondary | ICD-10-CM

## 2022-05-31 DIAGNOSIS — Z515 Encounter for palliative care: Secondary | ICD-10-CM | POA: Diagnosis not present

## 2022-05-31 DIAGNOSIS — I739 Peripheral vascular disease, unspecified: Secondary | ICD-10-CM | POA: Diagnosis not present

## 2022-05-31 DIAGNOSIS — I5032 Chronic diastolic (congestive) heart failure: Secondary | ICD-10-CM | POA: Diagnosis not present

## 2022-05-31 LAB — CBC
HCT: 31.3 % — ABNORMAL LOW (ref 36.0–46.0)
Hemoglobin: 10.8 g/dL — ABNORMAL LOW (ref 12.0–15.0)
MCH: 31.2 pg (ref 26.0–34.0)
MCHC: 34.5 g/dL (ref 30.0–36.0)
MCV: 90.5 fL (ref 80.0–100.0)
Platelets: 303 10*3/uL (ref 150–400)
RBC: 3.46 MIL/uL — ABNORMAL LOW (ref 3.87–5.11)
RDW: 14.3 % (ref 11.5–15.5)
WBC: 11.6 10*3/uL — ABNORMAL HIGH (ref 4.0–10.5)
nRBC: 0 % (ref 0.0–0.2)

## 2022-05-31 LAB — BASIC METABOLIC PANEL
Anion gap: 10 (ref 5–15)
BUN: 21 mg/dL (ref 8–23)
CO2: 28 mmol/L (ref 22–32)
Calcium: 8.5 mg/dL — ABNORMAL LOW (ref 8.9–10.3)
Chloride: 99 mmol/L (ref 98–111)
Creatinine, Ser: 0.67 mg/dL (ref 0.44–1.00)
GFR, Estimated: >60 mL/min (ref >60)
Glucose, Bld: 99 mg/dL (ref 70–99)
Potassium: 3.5 mmol/L (ref 3.5–5.1)
Sodium: 137 mmol/L (ref 135–145)

## 2022-05-31 LAB — VANCOMYCIN, PEAK: Vancomycin Pk: 38 ug/mL (ref 30–40)

## 2022-05-31 MED ORDER — ACETAMINOPHEN 325 MG PO TABS
650.0000 mg | ORAL_TABLET | Freq: Four times a day (QID) | ORAL | Status: DC
  Administered 2022-05-31 – 2022-06-05 (×21): 650 mg via ORAL
  Filled 2022-05-31 (×21): qty 2

## 2022-05-31 NOTE — Progress Notes (Signed)
Palliative Medicine Progress Note   Patient Name: [name]       Date: 05/31/2022 DOB: 08-10-1922  Age: 89 y.o. MRN#: [id number] Attending Physician: Emilee Hero, MD Primary Care Physician: Garwin Brothers, MD Admit Date: 05/28/2022    HPI/Patient Profile: 89 y.o. female  with past medical history of peripheral artery disease, chronic diastolic heart failure, atrial fibrillation not on anticoagulation, and hypertension who was admitted to [facility] on 05/28/2022 with chronic right lower extremity ulcer.  She was seen by vascular surgery 3/29, and has been recommended for above-the-knee amputation.   Palliative Medicine has been consulted for goals of care  Subjective: Chart reviewed. Per vascular surgery note today, options are above-the-knee amputation versus comfort based care with more aggressive pain management.   Bedside visit. Patient is OOB to the recliner, feeding herself dinner. She reports she "feels pretty good" and that her pain is "not too bad" currently.   I spoke with friend/Jan Clydene Laming and nephew/Jonathan (separately) about Ms. Mapp's current medical situation. They both confirm that she is adamantly against amputation, but is also not ready for comfort care. Patient/family remain hopeful that there can be some improvement with good wound management and supportive care. They speak to the fact that patient had a wound on the LLE that did eventually heal, although it took almost a year. They do verbalize understanding that the current RLE wound is much worse. They also verbalize understanding that blood flow is necessary for wound healing.   Discussed with Dr. Annie Paras. Plan for consults to [facility] and podiatry to explore options for wound management.   While meeting with nephew at  bedside, discussion on the difference between Palliative and Hospice care was had per his request. Palliative care and hospice have similar goals of managing symptoms and providing support in the setting of serious illness. However, palliative care may be offered during any phase of a serious illness, while hospice care is usually offered when a person is expected to live for 6 months or less. I provided nephew with a list of hospice and outpatient palliative agencies for if/when they are ready.    Objective:  Physical Exam Vitals reviewed.  Constitutional:      General: She is not in acute distress.    Appearance: She is ill-appearing.  Pulmonary:     Effort:  Pulmonary effort is normal.  Neurological:     Mental Status: She is alert.             Vital Signs: BP 123/61 (BP Location: Left Wrist)   Pulse (!) 55   Temp 98 F (36.7 C) (Oral)   Resp 20   Ht 5\' 8"  (1.727 m)   Wt 87.4 kg   SpO2 100%   BMI 29.30 kg/m  SpO2: SpO2: 100 % O2 Device: O2 Device: Nasal Cannula O2 Flow Rate: O2 Flow Rate (L/min): 2 L/min   Palliative Medicine Assessment & Plan   Assessment: Principal Problem:   Leg ulcer, left (HCC) Active Problems:   Atrial fibrillation (HCC)   Diastolic heart failure (HCC)   Edema   Bedbound   GERD (gastroesophageal reflux disease)    Recommendations/Plan: Full code - this has been discussed extensively and patient will not consider DNR status. Plan to return to [facility] SNF when medically optimized Nephew has been given a list of outpatient palliative and hospice agencies for if/when they are ready for this in the future  Goals are clear at this time. Please call [phone] if there are urgent needs.   Prognosis:  Unable to determine   Thank you for allowing the Palliative Medicine Team to assist in the care of this patient.   Greater than 50%  of this time was spent counseling and coordinating care related to the above assessment and plan.  Total  time: 7 minutes   Lavena Bullion, NP Palliative Medicine   Please contact Palliative Medicine Team phone at [phone] for questions and concerns.  For individual provider, see AMION.

## 2022-05-31 NOTE — Progress Notes (Addendum)
  Progress Note    05/31/2022 9:07 AM * No surgery found *  Subjective:  patient is adamantly against leg amputation but continues to be in pain   Vitals:   05/31/22 0746 05/31/22 0802  BP:    Pulse:    Resp:    Temp: 97.6 F (36.4 C)   SpO2:  97%   Physical Exam: Lungs:  non labored Extremities:  dressing left in place R foot/ankle Neurologic: A&O  CBC    Component Value Date/Time   WBC 11.6 (H) 05/31/2022 0608   RBC 3.46 (L) 05/31/2022 0608   HGB 10.8 (L) 05/31/2022 0608   HGB 14.1 08/30/2017 1629   HCT 31.3 (L) 05/31/2022 0608   HCT 44.2 08/30/2017 1629   PLT 303 05/31/2022 0608   PLT 238 08/30/2017 1629   MCV 90.5 05/31/2022 0608   MCV 96 08/30/2017 1629   MCH 31.2 05/31/2022 0608   MCHC 34.5 05/31/2022 0608   RDW 14.3 05/31/2022 0608   RDW 13.9 08/30/2017 1629   LYMPHSABS 1.8 05/28/2022 1544   MONOABS 1.6 (H) 05/28/2022 1544   EOSABS 0.4 05/28/2022 1544   BASOSABS 0.1 05/28/2022 1544    BMET    Component Value Date/Time   NA 137 05/31/2022 0608   NA 143 08/30/2017 1629   K 3.5 05/31/2022 0608   CL 99 05/31/2022 0608   CO2 28 05/31/2022 0608   GLUCOSE 99 05/31/2022 0608   BUN 21 05/31/2022 0608   BUN 32 08/30/2017 1629   CREATININE 0.67 05/31/2022 0608   CREATININE 1.02 (H) 12/30/2015 1138   CALCIUM 8.5 (L) 05/31/2022 0608   GFRNONAA >60 05/31/2022 0608   GFRAA 48 (L) 11/11/2018 1009    INR    Component Value Date/Time   INR 4.1 06/24/2015 1609   INR 1.18 05/27/2015 0317   INR 2.4 05/16/2010 1531     Intake/Output Summary (Last 24 hours) at 05/31/2022 [id number] Last data filed at 05/31/2022 0732 Gross per 24 hour  Intake 920 ml  Output 1050 ml  Net -130 ml     Assessment/Plan:  89 y.o. female with extensive R leg wound  Patient continues to be in pain uncontrolled with current regimen.  Again we discussed her only option being R AKA due to extensive tissue loss and nonambulatory status.  Patient is emotional and continues to be  adamantly against amputation.  Recommend a more aggressive pain control regimen with palliative care.      Dagoberto Ligas, PA-C [facility] [phone] 05/31/2022 9:07 AM  I agree with the above.  I have seen and evaluated the patient.  I had a very frank discussion with the patient.  She really only has 2 options, #1 is above-knee amputation, #2 is more aggressive pain management and palliative care.  The patient is abundantly clear that she does not want amputation at this time.  Therefore in order to respect her wishes, we should focus on more aggressive pain management and comfort care.  Annamarie Major

## 2022-05-31 NOTE — Progress Notes (Addendum)
PROGRESS NOTE    [name]  [id number] DOB: [birth year] DOA: 05/28/2022 PCP: Garwin Brothers, MD    Chief Complaint  Patient presents with   Wound Check    Brief Narrative:   [name] is a 89 y.o. female with medical history significant for peripheral arterial disease, hypertension, atrial fibrillation not on rate control or anticoagulation, being admitted to the hospital due to right lower extremity chronic ulceration.  Patient has known peripheral arterial disease, has been followed by vascular surgery.  [name] has a history of this chronic right heel ulcer, saw vascular surgery Dr. Unk Lightning a couple of weeks ago as an outpatient, he states that [name] is not a surgical candidate, especially due to being nonambulatory since [name] had a fracture last year of the right femur.  Transferred from [place] to [facility] for evaluation by vascular surgery.  Assessment & Plan:   Principal Problem:   Leg ulcer, left (HCC) Active Problems:   Atrial fibrillation (HCC)   Diastolic heart failure (HCC)   Edema   Bedbound   GERD (gastroesophageal reflux disease)   Nonhealing right leg wound/ulcer, -Patient with progressive worsening of her right lower extremity/ankle wound, with surrounding erythema . -Continue with IV vancomycin and cefepime -Follow-up blood cultures -Wound care consulted, continue with local wound care. -Vascular surgery consulted regarding further recommendation.  They recommended amputation but patient declines at this time -Increased pain regimen with scheduled Tylenol, every 4 hours oxycodone as needed Placed on bowel regimen due to opioids  Atrial fibrillation ([place]) - [name] does not appear to be on anticoagulation as [name] is high risk especially with falls.   Chronic Diastolic heart failure (HCC)  -continue Lasix, no evidence of exacerbation -Patient appears slightly volume up today we will continue home Lasix [name] is breathing comfortably  Bedbound - will  consult PT and OT   GERD (gastroesophageal reflux disease)  -continue p.o. PPI  Goals Of care -Have discussed with patient, and family members at bedside regarding goals of care, discussed with them about consulting palliative care to address goals of care and CODE STATUS, and they are agreeable.    Hypothyroidism-continue levothyroxine  DVT prophylaxis: Lovenox  Code Status: Full Family Communication: D/W nephew at bedside. Disposition:   Status is: Inpatient    Consultants:  Vascular surgery Palliative medicine   Subjective:  No significant events overnight, [name] is hard of hearing, [name] has intermittent episodes of pain in her leg.  Vascular surgery saw her and patient is still declining an amputation..  Labs are stable and leukocytosis has improved with antibiotics. Spoke with caretaker Jan and updated.  Objective: Vitals:   05/30/22 2100 05/30/22 2315 05/31/22 0746 05/31/22 0802  BP:  116/62    Pulse:  60    Resp:  20    Temp:  97.9 F (36.6 C) 97.6 F (36.4 C)   TempSrc:  Oral Oral   SpO2: 100% 100%  97%  Weight:      Height:        Intake/Output Summary (Last 24 hours) at 05/31/2022 1140 Last data filed at 05/31/2022 0732 Gross per 24 hour  Intake 820 ml  Output 1050 ml  Net -230 ml    Filed Weights   05/29/22 0032 05/29/22 0358 05/30/22 0438  Weight: 88.9 kg 87.3 kg 87.4 kg    Examination:  Physical Exam Vitals reviewed.  Constitutional:      Appearance: [name] is normal weight.  HENT:     Head: Atraumatic.  Nose: Nose normal.     Mouth/Throat:     Mouth: Mucous membranes are moist.     Pharynx: Oropharynx is clear.  Eyes:     Pupils: Pupils are equal, round, and reactive to light.  Cardiovascular:     Rate and Rhythm: Normal rate.     Pulses: Normal pulses.     Heart sounds: Normal heart sounds.  Pulmonary:     Effort: Pulmonary effort is normal.     Breath sounds: Normal breath sounds.  Abdominal:     General: Abdomen is flat. Bowel  sounds are normal.  Musculoskeletal:        General: Signs of injury present.  Skin:    General: Skin is warm.  Neurological:     Mental Status: [name] is alert. Mental status is at baseline.          Data Reviewed: I have personally reviewed following labs and imaging studies  CBC: Recent Labs  Lab 05/28/22 1544 05/29/22 0112 05/30/22 0114 05/31/22 0608  WBC 13.2* 13.0* 13.9* 11.6*  NEUTROABS 9.4*  --   --   --   HGB 11.8* 11.4* 11.4* 10.8*  HCT 37.2 34.3* 33.8* 31.3*  MCV 95.9 93.0 92.1 90.5  PLT 348 317 350 303     Basic Metabolic Panel: Recent Labs  Lab 05/28/22 1544 05/29/22 0112 05/30/22 0114 05/31/22 0608  NA 138 136 137 137  K 4.0 3.7 3.5 3.5  CL 101 100 98 99  CO2 29 26 28 28   GLUCOSE 112* 108* 108* 99  BUN 27* 20 20 21   CREATININE 0.59 0.60 0.80 0.67  CALCIUM 8.4* 8.4* 8.4* 8.5*  PHOS  --   --  2.7  --      GFR: Estimated Creatinine Clearance: 46.5 mL/min (by C-G formula based on SCr of 0.67 mg/dL).  Liver Function Tests: No results for input(s): "AST", "ALT", "ALKPHOS", "BILITOT", "PROT", "ALBUMIN" in the last 168 hours.  CBG: No results for input(s): "GLUCAP" in the last 168 hours.   Recent Results (from the past 240 hour(s))  Culture, blood (Routine X 2) w Reflex to ID Panel     Status: None (Preliminary result)   Collection Time: 05/28/22  3:44 PM   Specimen: Left Antecubital; Blood  Result Value Ref Range Status   Specimen Description   Final    LEFT ANTECUBITAL BLOOD Performed at [facility], [address] [place], [place] [address]    Special Requests   Final    BOTTLES DRAWN AEROBIC AND ANAEROBIC Blood Culture adequate volume Performed at [facility] [address], [place], [place] [address]    Culture   Final    NO GROWTH 3 DAYS Performed at [facility], [place] [address], [place], [place] [address]    Report Status PENDING  Incomplete  Culture, blood (Routine X 2) w Reflex to ID Panel      Status: None (Preliminary result)   Collection Time: 05/28/22  3:44 PM   Specimen: BLOOD LEFT FOREARM  Result Value Ref Range Status   Specimen Description   Final    BLOOD LEFT FOREARM Performed at [facility] [address], [place], [place] [address]    Special Requests   Final    BOTTLES DRAWN AEROBIC AND ANAEROBIC Blood Culture adequate volume Performed at [facility] [address], [place],  [address]    Culture   Final    NO GROWTH 3  DAYS Performed at [facility], [place] [address] [place], [place] [address]    Report Status PENDING  Incomplete         Radiology Studies: No results found.      Scheduled Meds:  calcium-vitamin D  1 tablet Oral Daily   enoxaparin (LOVENOX) injection  40 mg Subcutaneous Q24H   feeding supplement  237 mL Oral TID BM   furosemide  40 mg Oral Daily   levothyroxine  50 mcg Oral Daily   mometasone-formoterol  2 puff Inhalation BID   pantoprazole  40 mg Oral Daily   polyethylene glycol  17 g Oral Daily   Continuous Infusions:  ceFEPime (MAXIPIME) IV 2 g (05/31/22 0501)   vancomycin 1,000 mg (05/31/22 0616)     LOS: 3 days       Emilee Hero, MD [facility] Time spent: 30 minutes  To contact the attending provider between 7A-7P or the covering provider during after hours 7P-7A, please log into the web site [email or web address] and access using universal [place] password for that web site. If you do not have the password, please call the hospital operator.  05/31/2022, 11:40 AM

## 2022-05-31 NOTE — TOC Initial Note (Addendum)
Transition of Care [facility]) - Initial/Assessment Note    Patient Details  Name: JOYCELINE LICATA MRN: [id number] Date of Birth: Mar 31, 1922  Transition of Care [facility] CM/SW Contact:    [address], [place] Phone Number: 05/31/2022, 12:27 PM  Clinical Narrative:                  CSW spoke with pt's nephew Roderic Palau, he states Jan is the POC and Caretaker for pt. CSW spoke with Jan [phone], she states pt is from long term SNF at [facility] and the plan is for pt to return (bed has been reserved). Jan states pt to return to long term and declines STR, pt is bed bound and [name] has to use a hoyer lift to get pt out of bed and into the chair, she is unable to use her lower body, only her upper body. Jan states she is more concerned with the wound on pt's leg/ankle. Jan states she was under the impression that pt had been seen by [facility] and requests to speak with MD concerning pt. CSW relayed MD would be notified.        Patient Goals and CMS Choice            Expected Discharge Plan and Services                                              Prior Living Arrangements/Services                       Activities of Daily Living Home Assistive Devices/Equipment: Oxygen, Eyeglasses ADL Screening (condition at time of admission) Patient's cognitive ability adequate to safely complete daily activities?: No Is the patient deaf or have difficulty hearing?: Yes Does the patient have difficulty seeing, even when wearing glasses/contacts?: Yes Does the patient have difficulty concentrating, remembering, or making decisions?: Yes Patient able to express need for assistance with ADLs?: Yes Does the patient have difficulty dressing or bathing?: Yes Independently performs ADLs?: No Communication: Needs assistance Is this a change from baseline?: Pre-admission baseline Dressing (OT): Dependent Is this a change from baseline?: Pre-admission baseline Grooming:  Dependent Is this a change from baseline?: Pre-admission baseline Feeding: Needs assistance Is this a change from baseline?: Pre-admission baseline Bathing: Dependent Is this a change from baseline?: Pre-admission baseline Toileting: Dependent Is this a change from baseline?: Pre-admission baseline In/Out Bed: Dependent Is this a change from baseline?: Pre-admission baseline Walks in Home: Dependent Is this a change from baseline?: Pre-admission baseline Does the patient have difficulty walking or climbing stairs?: Yes Weakness of Legs: Both Weakness of Arms/Hands: Left  Permission Sought/Granted                  Emotional Assessment              Admission diagnosis:  Wound infection [T14.8XXA, L08.9] Leg ulcer, left [L97.929] Patient Active Problem List   Diagnosis Date Noted   Leg ulcer, left ([place]) 05/28/2022   Bedbound 05/28/2022   GERD (gastroesophageal reflux disease) 05/28/2022   Closed fracture of right distal femur ([place]) 08/07/2021   Periprosthetic fracture around internal prosthetic right knee joint 08/06/2021   Spinal stenosis of thoracolumbar region 11/02/2017   Right shoulder pain 05/24/2015   Primary osteoarthritis of left knee 05/24/2015   Encounter for therapeutic drug monitoring 09/15/2013  Hip pain 05/02/2012   Fall 05/02/2012   Long term (current) use of anticoagulants 08/08/2010   DEGENERATIVE JOINT DISEASE 08/06/2008   Malignant neoplasm of female breast ([place]) 08/03/2008   Essential hypertension 08/03/2008   Atrial fibrillation ([place]) [id number]   Diastolic heart failure ([place]) 08/03/2008   Edema 08/03/2008   PCP:  Garwin Brothers, MD Pharmacy:   [place], [place] [address] [place] [address] Phone: [phone] Fax: [phone]     Social Determinants of Health (SDOH) Social History: SDOH Screenings   Food Insecurity: No Food Insecurity (05/29/2022)  Housing: Low  Risk  (05/29/2022)  Transportation Needs: No Transportation Needs (05/29/2022)  Utilities: Not At Risk (05/29/2022)  Tobacco Use: Low Risk  (05/28/2022)   SDOH Interventions:     Readmission Risk Interventions    08/11/2021   12:14 PM  Readmission Risk Prevention Plan  Transportation Screening Complete  PCP or Specialist Appt within 3-5 Days Complete  HRI or [place] Complete  Social Work Consult for [place] Planning/Counseling Complete  Palliative Care Screening Not Applicable  Medication Review Press photographer) Complete

## 2022-05-31 NOTE — Progress Notes (Signed)
Mobility Specialist Progress Note    05/31/22 0903  Mobility  Activity Transferred from bed to chair  Level of Assistance +2 (takes two people)  Assistive Device MaxiMove  Activity Response Tolerated well  Mobility Referral Yes  $Mobility charge 1 Mobility   Pre-Mobility: 56 HR, 137/53 (78) BP, 97% SpO2 Post-Mobility: 56 HR, 136/62 (82) BP, 96% SpO2  Pt received and agreeable. C/o some pain w/ movement and change of position. Left with call bell in reach and NT present. RN aware.   Hildred Alamin Mobility Specialist  Please Psychologist, sport and exercise or Rehab Office at [phone]

## 2022-06-01 DIAGNOSIS — L97921 Non-pressure chronic ulcer of unspecified part of left lower leg limited to breakdown of skin: Secondary | ICD-10-CM | POA: Diagnosis not present

## 2022-06-01 LAB — VANCOMYCIN, TROUGH: Vancomycin Tr: 17 ug/mL (ref 15–20)

## 2022-06-01 MED ORDER — VANCOMYCIN HCL 750 MG/150ML IV SOLN
750.0000 mg | INTRAVENOUS | Status: DC
  Administered 2022-06-02 – 2022-06-03 (×2): 750 mg via INTRAVENOUS
  Filled 2022-06-01 (×3): qty 150

## 2022-06-01 NOTE — Progress Notes (Signed)
Please call should pt choose to pursue amputation.    Broadus John MD

## 2022-06-01 NOTE — Care Management Important Message (Signed)
Important Message  Patient Details  Name: [name] MRN: [id number] Date of Birth: [birth year]   Medicare Important Message Given:  Yes     Shelda Altes 06/01/2022, 8:54 AM

## 2022-06-01 NOTE — Progress Notes (Signed)
Pharmacy Antibiotic Note  [name] is a 89 y.o. female admitted on 05/28/2022 with wound infection.  Pharmacy has been consulted for vancomycin dosing; cefepime per MD.   Patient's renal function has remained stable with CrCl 40-50 mL/min. AUC levels obtained 3/31 at steady state on Vancomycin 1000mg  Q24H  Vanc Peak: 38 mcg/mL Vanc Trough 17 mcg/mL Calc AUC: 637 (supratherapeutic)  Plan: Decrease vancomycin to 750mg  Q24H (eAUC 478 based on previous levels) Continue cefepime 2g Q12H  F/u long-term antibiotic plans. Patient currently declining amputation.  Height: 5\' 8"  (172.7 cm) Weight: 87.4 kg (192 lb 10.9 oz) IBW/kg (Calculated) : 63.9  Temp (24hrs), Avg:97.7 F (36.5 C), Min:97.3 F (36.3 C), Max:98 F (36.7 C)  Recent Labs  Lab 05/28/22 1544 05/29/22 0112 05/30/22 0114 05/31/22 0608 05/31/22 0805 06/01/22 0609  WBC 13.2* 13.0* 13.9* 11.6*  --   --   CREATININE 0.59 0.60 0.80 0.67  --   --   LATICACIDVEN 1.9  --   --   --   --   --   VANCOTROUGH  --   --   --   --   --  17  VANCOPEAK  --   --   --   --  38  --      Estimated Creatinine Clearance: 46.5 mL/min (by C-G formula based on SCr of 0.67 mg/dL).    Allergies  Allergen Reactions   Morphine And Related Other (See Comments)    Other Reaction: mild Intolerance, Allergy   Sulfa Antibiotics     Other reaction(s): Other (See Comments) Other Reaction: mild Intolerance, Allergy   Meperidine Hcl Nausea Only   Nsaids     Other reaction(s): Other (See Comments) Bleeding ulcer   Tolmetin Other (See Comments)    Other reaction(s): Other (See Comments) Bleeding ulcer    Antimicrobials this admission: Vancomycin 3/28 >>  Cefepime 3/28 >>  Dose adjustments this admission: 3/31 VP/VT 38/17 (calc AUC 637) on 1000 Q24H >> 750 Q24H (eAUC 478)  Microbiology results: 3/28 BCx: NGTD  Thank you for allowing pharmacy to be a part of this patient's care.  Merrilee Jansky, PharmD Clinical Pharmacist 06/01/2022  8:30 AM

## 2022-06-01 NOTE — Progress Notes (Signed)
Mobility Specialist: Progress Note   06/01/22 1529  Mobility  Activity Transferred from chair to bed  Level of Assistance +2 (takes two people)  Assistive Device MaxiMove  Activity Response Tolerated well  Mobility Referral Yes  $Mobility charge 1 Mobility   Pt assisted back to bed per request. No c/o throughout. Pt is in the bed with RN present in the room.   [place] [name] Mobility Specialist Please contact via SecureChat or Rehab office at [phone]

## 2022-06-01 NOTE — Progress Notes (Signed)
PROGRESS NOTE    [name]  [id number] DOB: 30-Jan-1923 DOA: 05/28/2022 PCP: Garwin Brothers, MD   Brief Narrative:  [name] is a 89 y.o. female with medical history significant for peripheral arterial disease, hypertension, atrial fibrillation not on rate control or anticoagulation, being admitted to the hospital due to right lower extremity chronic ulceration.  Patient has known peripheral arterial disease, has been followed by vascular surgery.  She has a history of this chronic right heel ulcer, saw vascular surgery Dr. Unk Lightning a couple of weeks ago as an outpatient, he states that she is not a surgical candidate, especially due to being nonambulatory since she had a fracture last year of the right femur.  Transferred from [place] to [facility] for evaluation by vascular surgery.   Assessment & Plan:   Principal Problem:   Leg ulcer, left Active Problems:   Atrial fibrillation   Diastolic heart failure   Edema   Bedbound   GERD (gastroesophageal reflux disease)  Nonhealing right leg wound/ulcer, -Patient with progressive worsening of her right lower extremity/ankle wound, with surrounding erythema.  Seen by wound care.  Also seen by vascular surgery who recommends below-knee amputation which patient has adamantly refused.  She remains on vancomycin and cefepime.  Patient and family had lengthy discussion with palliative care about [place] however they remain adamant on not having amputation.  They were looking forward to talk to " wound specialist" is what patient's nephew [name] who is at the bedside as well as [name] who is her power of attorney told me over the phone.  I told him that the vascular surgery in fact is the wound doctor and patient is also being seen by the wound care nurses.  They were wondering if the infection has spread to the bone.  I told him that in order to find that out, we will need to do MRI.  They want to proceed with the MRI so there is more  information to provide to the patient.  Will proceed with that.  Patient's pain appears to be fairly controlled.   Paroxysmal atrial fibrillation (HCC) she does not appear to be on anticoagulation as she is high risk especially with falls.    Chronic Diastolic heart failure (HCC)  -continue Lasix, no evidence of exacerbation.  Very minimal bilateral lower extremity pitting edema which is likely due to immobility.   Bedbound Seen by PT OT.   GERD (gastroesophageal reflux disease)  -continue p.o. PPI   Goals Of care Palliative care on board.   Hypothyroidism-continue levothyroxine  DVT prophylaxis: SCDs Start: 05/28/22 1807   Code Status: Full Code  Family Communication: [name] present at bedside.  Plan of care discussed with patient in length and he/she verbalized understanding and agreed with it.  Also discussed with [name] over the phone.  Status is: Inpatient Remains inpatient appropriate because: MRI pending.   Estimated body mass index is 29.3 kg/m as calculated from the following:   Height as of this encounter: 5\' 8"  (1.727 m).   Weight as of this encounter: 87.4 kg.    Nutritional Assessment: Body mass index is 29.3 kg/m.Marland Kitchen Seen by dietician.  I agree with the assessment and plan as outlined below: Nutrition Status:        . Skin Assessment: I have examined the patient's skin and I agree with the wound assessment as performed by the wound care RN as outlined below:    Consultants:  Vascular surgery  Procedures:  As  above  Antimicrobials:  Anti-infectives (From admission, onward)    Start     Dose/Rate Route Frequency Ordered Stop   06/02/22 1000  vancomycin (VANCOREADY) IVPB 750 mg/150 mL        750 mg 150 mL/hr over 60 Minutes Intravenous Every 24 hours 06/01/22 0829     05/29/22 0600  ceFEPIme (MAXIPIME) 2 g in sodium chloride 0.9 % 100 mL IVPB        2 g 200 mL/hr over 30 Minutes Intravenous Every 12 hours 05/28/22 1833     05/29/22 0600   vancomycin (VANCOCIN) IVPB 1000 mg/200 mL premix  Status:  Discontinued        1,000 mg 200 mL/hr over 60 Minutes Intravenous Every 24 hours 05/28/22 2059 06/01/22 0829   05/28/22 1745  ceFEPIme (MAXIPIME) 2 g in sodium chloride 0.9 % 100 mL IVPB        2 g 200 mL/hr over 30 Minutes Intravenous  Once 05/28/22 1742 05/28/22 1957   05/28/22 1415  vancomycin (VANCOCIN) IVPB 1000 mg/200 mL premix        1,000 mg 200 mL/hr over 60 Minutes Intravenous  Once 05/28/22 1413 05/28/22 1735         Subjective: Patient seen and examined.  Very hard of hearing.  Appears comfortable.  Complains of right lower extremity pain but that is bearable and controlled with pain medications.  Objective: Vitals:   05/31/22 2035 05/31/22 2316 06/01/22 0801 06/01/22 0805  BP:  (!) 119/46  (!) 131/52  Pulse: (!) 59 (!) 58 (!) 58 (!) 58  Resp: (!) 22 (!) 21 18 16   Temp:  (!) 97.5 F (36.4 C)  (!) 97.5 F (36.4 C)  TempSrc:  Oral  Oral  SpO2: 97% 100% 98% 98%  Weight:      Height:        Intake/Output Summary (Last 24 hours) at 06/01/2022 1224 Last data filed at 05/31/2022 2205 Gross per 24 hour  Intake 400 ml  Output 1065 ml  Net -665 ml   Filed Weights   05/29/22 0032 05/29/22 0358 05/30/22 0438  Weight: 88.9 kg 87.3 kg 87.4 kg    Examination:  General exam: Appears calm and comfortable  Respiratory system: Clear to auscultation. Respiratory effort normal. Cardiovascular system: S1 & S2 heard, RRR. No JVD, murmurs, rubs, gallops or clicks. No pedal edema. Gastrointestinal system: Abdomen is nondistended, soft and nontender. No organomegaly or masses felt. Normal bowel sounds heard. Central nervous system: Alert and oriented. No focal neurological deficits. Extremities: Has dressing in the right foot.   Data Reviewed: I have personally reviewed following labs and imaging studies  CBC: Recent Labs  Lab 05/28/22 1544 05/29/22 0112 05/30/22 0114 05/31/22 0608  WBC 13.2* 13.0* 13.9* 11.6*   NEUTROABS 9.4*  --   --   --   HGB 11.8* 11.4* 11.4* 10.8*  HCT 37.2 34.3* 33.8* 31.3*  MCV 95.9 93.0 92.1 90.5  PLT 348 317 350 [id number]   Basic Metabolic Panel: Recent Labs  Lab 05/28/22 1544 05/29/22 0112 05/30/22 0114 05/31/22 0608  NA 138 136 137 137  K 4.0 3.7 3.5 3.5  CL 101 100 98 99  CO2 29 26 28 28   GLUCOSE 112* 108* 108* 99  BUN 27* 20 20 21   CREATININE 0.59 0.60 0.80 0.67  CALCIUM 8.4* 8.4* 8.4* 8.5*  PHOS  --   --  2.7  --    GFR: Estimated Creatinine Clearance: 46.5 mL/min (by C-G formula based  on SCr of 0.67 mg/dL). Liver Function Tests: No results for input(s): "AST", "ALT", "ALKPHOS", "BILITOT", "PROT", "ALBUMIN" in the last 168 hours. No results for input(s): "LIPASE", "AMYLASE" in the last 168 hours. No results for input(s): "AMMONIA" in the last 168 hours. Coagulation Profile: No results for input(s): "INR", "PROTIME" in the last 168 hours. Cardiac Enzymes: No results for input(s): "CKTOTAL", "CKMB", "CKMBINDEX", "TROPONINI" in the last 168 hours. BNP (last 3 results) No results for input(s): "PROBNP" in the last 8760 hours. HbA1C: No results for input(s): "HGBA1C" in the last 72 hours. CBG: No results for input(s): "GLUCAP" in the last 168 hours. Lipid Profile: No results for input(s): "CHOL", "HDL", "LDLCALC", "TRIG", "CHOLHDL", "LDLDIRECT" in the last 72 hours. Thyroid Function Tests: No results for input(s): "TSH", "T4TOTAL", "FREET4", "T3FREE", "THYROIDAB" in the last 72 hours. Anemia Panel: No results for input(s): "VITAMINB12", "FOLATE", "FERRITIN", "TIBC", "IRON", "RETICCTPCT" in the last 72 hours. Sepsis Labs: Recent Labs  Lab 05/28/22 1544  LATICACIDVEN 1.9    Recent Results (from the past 240 hour(s))  Culture, blood (Routine X 2) w Reflex to ID Panel     Status: None (Preliminary result)   Collection Time: 05/28/22  3:44 PM   Specimen: Left Antecubital; Blood  Result Value Ref Range Status   Specimen Description   Final    LEFT  ANTECUBITAL BLOOD Performed at [facility] [address], [place], [place] [address]    Special Requests   Final    BOTTLES DRAWN AEROBIC AND ANAEROBIC Blood Culture adequate volume Performed at [facility] [address], [place], [place] [address]    Culture   Final    NO GROWTH 4 DAYS Performed at [facility], [place] [address], [place], [place] [address]    Report Status PENDING  Incomplete  Culture, blood (Routine X 2) w Reflex to ID Panel     Status: None (Preliminary result)   Collection Time: 05/28/22  3:44 PM   Specimen: BLOOD LEFT FOREARM  Result Value Ref Range Status   Specimen Description   Final    BLOOD LEFT FOREARM Performed at [facility] [address] [place], [place] [address]    Special Requests   Final    BOTTLES DRAWN AEROBIC AND ANAEROBIC Blood Culture adequate volume Performed at [facility] [address], [place], [place] [address]    Culture   Final    NO GROWTH 4 DAYS Performed at [facility], [place] [address], [place], [place] [address]    Report Status PENDING  Incomplete     Radiology Studies: No results found.  Scheduled Meds:  acetaminophen  650 mg Oral Q6H   calcium-vitamin D  1 tablet Oral Daily   feeding supplement  237 mL Oral TID BM   furosemide  40 mg Oral Daily   levothyroxine  50 mcg Oral Daily   mometasone-formoterol  2 puff Inhalation BID   pantoprazole  40 mg Oral Daily   polyethylene glycol  17 g Oral Daily   Continuous Infusions:  ceFEPime (MAXIPIME) IV 2 g (06/01/22 0641)   [START ON 06/02/2022] vancomycin       LOS: 4 days   Darliss Cheney, MD [facility]  06/01/2022, 12:24 PM   *Please note that this is a verbal dictation therefore any spelling or grammatical errors are due to the "[place] [employer]" system interpretation.  Please page via [place] and do not message via secure  chat for urgent patient care matters. Secure chat can  be used for non urgent patient care matters.  How to contact the [facility] Attending or Consulting provider [place] or covering provider during after hours [place], for this patient?  Check the care team in [name] and look for a) attending/consulting TRH provider listed and b) the [facility] team listed. Page or secure chat [phone]. Log into [email or web address] and use [place]'s universal password to access. If you do not have the password, please contact the hospital operator. Locate the [facility] provider you are looking for under [facility] and page to a number that you can be directly reached. If you still have difficulty reaching the provider, please page the [facility] (Director on Call) for the Hospitalists listed on amion for assistance.

## 2022-06-01 NOTE — Progress Notes (Signed)
Mobility Specialist: Progress Note   06/01/22 1149  Mobility  Activity Transferred from bed to chair  Level of Assistance +2 (takes two people)  Assistive Device MaxiMove  Activity Response Tolerated well  Mobility Referral Yes  $Mobility charge 1 Mobility   Pt received in the bed and assisted to the chair per request. Completed on 2 L/min [place]. C/o RLE pain during transfer, otherwise asymptomatic. Pt is in the chair with NT present in the room.   [place] Kemal Amores Mobility Specialist Please contact via SecureChat or Rehab office at [phone]

## 2022-06-02 ENCOUNTER — Inpatient Hospital Stay (HOSPITAL_COMMUNITY): Payer: Medicare PPO

## 2022-06-02 DIAGNOSIS — L97921 Non-pressure chronic ulcer of unspecified part of left lower leg limited to breakdown of skin: Secondary | ICD-10-CM | POA: Diagnosis not present

## 2022-06-02 LAB — CULTURE, BLOOD (ROUTINE X 2)
Culture: NO GROWTH
Culture: NO GROWTH
Special Requests: ADEQUATE
Special Requests: ADEQUATE

## 2022-06-02 LAB — CREATININE, SERUM
Creatinine, Ser: 0.71 mg/dL (ref 0.44–1.00)
GFR, Estimated: >60 mL/min (ref >60)

## 2022-06-02 MED ORDER — ENOXAPARIN SODIUM 40 MG/0.4ML IJ SOSY
40.0000 mg | PREFILLED_SYRINGE | Freq: Every day | INTRAMUSCULAR | Status: DC
  Administered 2022-06-02 – 2022-06-05 (×4): 40 mg via SUBCUTANEOUS
  Filled 2022-06-02 (×4): qty 0.4

## 2022-06-02 NOTE — Plan of Care (Signed)
  Problem: Clinical Measurements: Goal: Respiratory complications will improve Outcome: Progressing   Problem: Nutrition: Goal: Adequate nutrition will be maintained Outcome: Progressing   Problem: Coping: Goal: Level of anxiety will decrease Outcome: Progressing   Problem: Pain Managment: Goal: General experience of comfort will improve Outcome: Progressing   

## 2022-06-02 NOTE — Progress Notes (Signed)
Patient when awake H.R. At. Flutter rate in the 50-60's when asleep H.R. will drop to 39 and 40's for a few seconds then back up in the 50-60 range.No complaints voiced. Cont. To monitor patient and rhythm

## 2022-06-02 NOTE — Progress Notes (Signed)
PROGRESS NOTE    [name]  [id number] DOB: March 29, 1922 DOA: 05/28/2022 PCP: Garwin Brothers, MD   Brief Narrative:  [name] is a 89 y.o. female with medical history significant for peripheral arterial disease, hypertension, atrial fibrillation not on rate control or anticoagulation, being admitted to the hospital due to right lower extremity chronic ulceration.  Patient has known peripheral arterial disease, has been followed by vascular surgery.  She has a history of this chronic right heel ulcer, saw vascular surgery Dr. Unk Lightning a couple of weeks ago as an outpatient, he states that she is not a surgical candidate, especially due to being nonambulatory since she had a fracture last year of the right femur.  Transferred from [place] to [facility] for evaluation by vascular surgery.   Assessment & Plan:   Principal Problem:   Leg ulcer, left Active Problems:   Atrial fibrillation   Diastolic heart failure   Edema   Bedbound   GERD (gastroesophageal reflux disease)  Nonhealing right leg wound/ulcer, -Patient with progressive worsening of her right lower extremity/ankle wound, with surrounding erythema.  Seen by wound care.  Also seen by vascular surgery who recommends below-knee amputation which patient has adamantly refused.  She remains on vancomycin and cefepime.  Patient and family had lengthy discussion with palliative care about [name] however they remain adamant on not having amputation.  They were looking forward to talk to " wound specialist" is what patient's nephew [name] who is at the bedside as well as [name] who is her power of attorney told me over the phone.  I told him that the vascular surgery in fact is the wound doctor and patient is also being seen by the wound care nurses.  They were wondering if the infection has spread to the bone.  I told him that in order to find that out, we will need to do MRI.  They want to proceed with the MRI so there is more  information to provide to the patient.  MRI foot was ordered yesterday but still is pending.  Patient to make further decisions once MRI results are known.   Paroxysmal atrial fibrillation (HCC) she does not appear to be on anticoagulation as she is high risk especially with falls.    Chronic Diastolic heart failure (HCC)  -continue Lasix, no evidence of exacerbation.  Very minimal bilateral lower extremity pitting edema which is likely due to immobility.   Bedbound Seen by PT OT.   GERD (gastroesophageal reflux disease)  -continue p.o. PPI   Goals Of care Palliative care on board.   Hypothyroidism-continue levothyroxine  DVT prophylaxis: enoxaparin (LOVENOX) injection 40 mg Start: 06/02/22 1000 SCDs Start: 05/28/22 1807   Code Status: Full Code  Family Communication: [name] present at bedside.  Plan of care discussed with patient in length and he/she verbalized understanding and agreed with it.    Status is: Inpatient Remains inpatient appropriate because: MRI pending.   Estimated body mass index is 30.2 kg/m as calculated from the following:   Height as of this encounter: 5\' 8"  (1.727 m).   Weight as of this encounter: 90.1 kg.    Nutritional Assessment: Body mass index is 30.2 kg/m.Marland Kitchen Seen by dietician.  I agree with the assessment and plan as outlined below: Nutrition Status:        . Skin Assessment: I have examined the patient's skin and I agree with the wound assessment as performed by the wound care RN as outlined below:  Consultants:  Vascular surgery  Procedures:  As above  Antimicrobials:  Anti-infectives (From admission, onward)    Start     Dose/Rate Route Frequency Ordered Stop   06/02/22 1000  vancomycin (VANCOREADY) IVPB 750 mg/150 mL        750 mg 150 mL/hr over 60 Minutes Intravenous Every 24 hours 06/01/22 0829     05/29/22 0600  ceFEPIme (MAXIPIME) 2 g in sodium chloride 0.9 % 100 mL IVPB        2 g 200 mL/hr over 30 Minutes  Intravenous Every 12 hours 05/28/22 1833     05/29/22 0600  vancomycin (VANCOCIN) IVPB 1000 mg/200 mL premix  Status:  Discontinued        1,000 mg 200 mL/hr over 60 Minutes Intravenous Every 24 hours 05/28/22 2059 06/01/22 0829   05/28/22 1745  ceFEPIme (MAXIPIME) 2 g in sodium chloride 0.9 % 100 mL IVPB        2 g 200 mL/hr over 30 Minutes Intravenous  Once 05/28/22 1742 05/28/22 1957   05/28/22 1415  vancomycin (VANCOCIN) IVPB 1000 mg/200 mL premix        1,000 mg 200 mL/hr over 60 Minutes Intravenous  Once 05/28/22 1413 05/28/22 1735         Subjective: Patient seen and examined she is comfortable.  Denied any uncomfortable pain.  [name] at bedside.  Objective: Vitals:   06/02/22 0333 06/02/22 0530 06/02/22 0807 06/02/22 0840  BP: 112/61  (!) 106/44   Pulse: (!) 56  (!) 57   Resp: 19  16   Temp: (!) 97.5 F (36.4 C)  97.9 F (36.6 C)   TempSrc: Oral  Oral   SpO2: 100%  98% 99%  Weight:  90.1 kg    Height:        Intake/Output Summary (Last 24 hours) at 06/02/2022 1127 Last data filed at 06/02/2022 0542 Gross per 24 hour  Intake 880 ml  Output 1275 ml  Net -395 ml    Filed Weights   05/29/22 0358 05/30/22 0438 06/02/22 0530  Weight: 87.3 kg 87.4 kg 90.1 kg    Examination: General exam: Appears calm and comfortable  Respiratory system: Clear to auscultation. Respiratory effort normal. Cardiovascular system: S1 & S2 heard, RRR. No JVD, murmurs, rubs, gallops or clicks. No pedal edema. Gastrointestinal system: Abdomen is nondistended, soft and nontender. No organomegaly or masses felt. Normal bowel sounds heard. Central nervous system: Alert and oriented. No focal neurological deficits. Extremities: Has dressing in the right foot.  Data Reviewed: I have personally reviewed following labs and imaging studies  CBC: Recent Labs  Lab 05/28/22 1544 05/29/22 0112 05/30/22 0114 05/31/22 0608  WBC 13.2* 13.0* 13.9* 11.6*  NEUTROABS 9.4*  --   --   --    HGB 11.8* 11.4* 11.4* 10.8*  HCT 37.2 34.3* 33.8* 31.3*  MCV 95.9 93.0 92.1 90.5  PLT 348 317 350 [id number]    Basic Metabolic Panel: Recent Labs  Lab 05/28/22 1544 05/29/22 0112 05/30/22 0114 05/31/22 0608 06/02/22 0440  NA 138 136 137 137  --   K 4.0 3.7 3.5 3.5  --   CL 101 100 98 99  --   CO2 29 26 28 28   --   GLUCOSE 112* 108* 108* 99  --   BUN 27* 20 20 21   --   CREATININE 0.59 0.60 0.80 0.67 0.71  CALCIUM 8.4* 8.4* 8.4* 8.5*  --   PHOS  --   --  2.7  --   --     GFR: Estimated Creatinine Clearance: 47.2 mL/min (by C-G formula based on SCr of 0.71 mg/dL). Liver Function Tests: No results for input(s): "AST", "ALT", "ALKPHOS", "BILITOT", "PROT", "ALBUMIN" in the last 168 hours. No results for input(s): "LIPASE", "AMYLASE" in the last 168 hours. No results for input(s): "AMMONIA" in the last 168 hours. Coagulation Profile: No results for input(s): "INR", "PROTIME" in the last 168 hours. Cardiac Enzymes: No results for input(s): "CKTOTAL", "CKMB", "CKMBINDEX", "TROPONINI" in the last 168 hours. BNP (last 3 results) No results for input(s): "PROBNP" in the last 8760 hours. HbA1C: No results for input(s): "HGBA1C" in the last 72 hours. CBG: No results for input(s): "GLUCAP" in the last 168 hours. Lipid Profile: No results for input(s): "CHOL", "HDL", "LDLCALC", "TRIG", "CHOLHDL", "LDLDIRECT" in the last 72 hours. Thyroid Function Tests: No results for input(s): "TSH", "T4TOTAL", "FREET4", "T3FREE", "THYROIDAB" in the last 72 hours. Anemia Panel: No results for input(s): "VITAMINB12", "FOLATE", "FERRITIN", "TIBC", "IRON", "RETICCTPCT" in the last 72 hours. Sepsis Labs: Recent Labs  Lab 05/28/22 1544  LATICACIDVEN 1.9     Recent Results (from the past 240 hour(s))  Culture, blood (Routine X 2) w Reflex to ID Panel     Status: None   Collection Time: 05/28/22  3:44 PM   Specimen: Left Antecubital; Blood  Result Value Ref Range Status   Specimen Description    Final    LEFT ANTECUBITAL BLOOD Performed at [facility] [address], [place], [place] [address]    Special Requests   Final    BOTTLES DRAWN AEROBIC AND ANAEROBIC Blood Culture adequate volume Performed at [facility] [address], [place], [place] [address]    Culture   Final    NO GROWTH 5 DAYS Performed at [facility], [place] [address], [place], [place] [address]    Report Status 06/02/2022 FINAL  Final  Culture, blood (Routine X 2) w Reflex to ID Panel     Status: None   Collection Time: 05/28/22  3:44 PM   Specimen: BLOOD LEFT FOREARM  Result Value Ref Range Status   Specimen Description   Final    BLOOD LEFT FOREARM Performed at [facility] [address], [place], [place] [address]    Special Requests   Final    BOTTLES DRAWN AEROBIC AND ANAEROBIC Blood Culture adequate volume Performed at [facility] [address], [place], [place] [address]    Culture   Final    NO GROWTH 5 DAYS Performed at [facility], [place] [address], [place], [place] [address]    Report Status 06/02/2022 FINAL  Final     Radiology Studies: No results found.  Scheduled Meds:  acetaminophen  650 mg Oral Q6H   calcium-vitamin D  1 tablet Oral Daily   enoxaparin (LOVENOX) injection  40 mg Subcutaneous Daily   feeding supplement  237 mL Oral TID BM   furosemide  40 mg Oral Daily   levothyroxine  50 mcg Oral Daily   mometasone-formoterol  2 puff Inhalation BID   pantoprazole  40 mg Oral Daily   polyethylene glycol  17 g Oral Daily   Continuous Infusions:  ceFEPime (MAXIPIME) IV Stopped (06/02/22 0542)   vancomycin 750 mg (06/02/22 0931)     LOS: 5 days   Darliss Cheney, MD [facility]  06/02/2022, 11:27 AM   *Please note that this is a verbal  dictation therefore any spelling or grammatical errors are due to the "[employer]" system interpretation.  Please page via [place] and do not  message via secure chat for urgent patient care matters. Secure chat can be used for non urgent patient care matters.  How to contact the [facility] Attending or Consulting provider [place] or covering provider during after hours [place], for this patient?  Check the care team in [facility] and look for a) attending/consulting TRH provider listed and b) the [facility] team listed. Page or secure chat [phone]. Log into [email or web address] and use [place]'s universal password to access. If you do not have the password, please contact the hospital operator. Locate the [facility] provider you are looking for under [facility] and page to a number that you can be directly reached. If you still have difficulty reaching the provider, please page the [facility] (Director on Call) for the Hospitalists listed on amion for assistance.

## 2022-06-02 NOTE — TOC Progression Note (Signed)
Transition of Care [facility]) - Progression Note    Patient Details  Name: [name] MRN: [id number] Date of Birth: [birth year]  Transition [facility] CM/SW [place], [place] Phone Number: 06/02/2022, 3:40 PM  Clinical Narrative:     TOC continues to follow  Expected Discharge Plan: [place] Barriers to Discharge: Continued Medical Work up  Expected Discharge Plan and Services In-house Referral: Clinical Social Work     Living arrangements for the past 2 months: [place]                                       Social Determinants of Health (SDOH) Interventions SDOH Screenings   Food Insecurity: No Food Insecurity (05/29/2022)  Housing: Low Risk  (05/29/2022)  Transportation Needs: No Transportation Needs (05/29/2022)  Utilities: Not At Risk (05/29/2022)  Tobacco Use: Low Risk  (05/28/2022)    Readmission Risk Interventions    08/11/2021   12:14 PM  Readmission Risk Prevention Plan  Transportation Screening Complete  PCP or Specialist Appt within 3-5 Days Complete  HRI or [place] Complete  Social Work Consult for [place] Planning/Counseling Complete  Palliative Care Screening Not Applicable  Medication Review Press photographer) Complete

## 2022-06-02 NOTE — Progress Notes (Signed)
PT Cancellation Note  Patient Details Name: [name] MRN: [id number] DOB: 25-Dec-1922   Cancelled Treatment:    Reason Eval/Treat Not Completed: (P) Medical issues which prohibited therapy (Pt HR 30's-40's bpm, defer bed-level exercise or transfers until vital signs more stable; pt is currently full code.)   Carlene Coria 06/02/2022, 7:05 PM

## 2022-06-02 NOTE — Progress Notes (Signed)
Physician made aware of heart rate 30's to low 40's

## 2022-06-03 ENCOUNTER — Encounter (HOSPITAL_BASED_OUTPATIENT_CLINIC_OR_DEPARTMENT_OTHER): Payer: Medicare PPO | Admitting: Physician Assistant

## 2022-06-03 DIAGNOSIS — L97921 Non-pressure chronic ulcer of unspecified part of left lower leg limited to breakdown of skin: Secondary | ICD-10-CM | POA: Diagnosis not present

## 2022-06-03 LAB — C-REACTIVE PROTEIN: CRP: 8.6 mg/dL — ABNORMAL HIGH (ref <1.0)

## 2022-06-03 LAB — SEDIMENTATION RATE: Sed Rate: >140 mm/hr — ABNORMAL HIGH (ref 0–22)

## 2022-06-03 NOTE — Progress Notes (Signed)
Pt offered breakfast x3 by Rn and NT pt refusing breakfast at this time

## 2022-06-03 NOTE — Progress Notes (Signed)
Physical Therapy Treatment Patient Details Name: [name] MRN: [id number] DOB: 1922/04/28 Today's Date: 06/03/2022   History of Present Illness 89 yo female presenting from facility on 3/28 due to pressure wounds of RLE. PMH includes: PAD, HTN, afib, hip surgery, ORIF of R femur fx 2023.    PT Comments    Pt received in supine, agreeable to therapy session with emphasis on bed mobility and improved body mechanics for self-assist with rolling in bed for placement of lift pad. Pt totalA for mechanical lift OOB to chair, mobility specialist present to assist for pt safety. Emphasis on supine, sidelying and seated UE/LE exercises for strengthening with teach-back and pt putting forth good effort. Limited pt insight into deficits and endurance, pt reporting only minimal fatigue but just after this appearing more moderate to severely fatigued and only completing ~5 reps of each UE exercise prior to stopping despite encouragement. Handouts printed to give to family member (nephew) in room after session. Pt achieves up to 244mL on IS. Up in chair with lift pad under her at end of session, RN aware. MD order in for air mattress for her Hill-ROM bed for skin protection, per RN portable equipment is trying to obtain one for her room. Anticipate PT may be able to D/C acute PT next session if pt/family has good understanding of HEP handouts to continue post-acute.   Recommendations for follow up therapy are one component of a multi-disciplinary discharge planning process, led by the attending physician.  Recommendations may be updated based on patient status, additional functional criteria and insurance authorization.  Follow Up Recommendations  Can patient physically be transported by private vehicle: No    Assistance Recommended at Discharge Frequent or constant Supervision/Assistance  Patient can return home with the following Two people to help with walking and/or transfers;Two people to help with  bathing/dressing/bathroom;Assistance with feeding;Direct supervision/assist for financial management;Direct supervision/assist for medications management;Assist for transportation;Help with stairs or ramp for entrance   Equipment Recommendations  None recommended by PT (would benefit from geomat air cushion for chair for pressure relief and air mattress for bed for wound risk reduction)    Recommendations for Other Services       Precautions / Restrictions Precautions Precautions: Fall Precaution Comments: significant wounds BLE, prevalon boots and would benefit from air bed Restrictions Weight Bearing Restrictions: No     Mobility  Bed Mobility Overal bed mobility: Needs Assistance Bed Mobility: Rolling Rolling: Max assist, +2 for physical assistance (nearly totalA)         General bed mobility comments: pt attempting to assist with movement of UE, not able to generate enough force to assist significantly. Reviewed use of LLE to push into bed for L hip extension to assist with weight shifting to her R (manual assist to achieve this posture), minimal carryover and very painful for her when attempting due to c/o L knee pain. Reviewed this also with her nephew along with cross-body reaching to opposite rail.    Transfers Overall transfer level: Needs assistance   Transfers: Bed to chair/wheelchair/BSC             General transfer comment: totalA with maximove to chair from bed Transfer via Lift Equipment: Maximove  Ambulation/Gait               General Gait Details: pt non-ambulatory at baseline   Stairs             Wheelchair Mobility    Modified Rankin (Stroke Patients Only)  Balance                                            Cognition Arousal/Alertness: Awake/alert Behavior During Therapy: WFL for tasks assessed/performed Overall Cognitive Status: Within Functional Limits for tasks assessed                                  General Comments: HoH, pt cooperative, minimally verbal but alert and following commands with increased time (possibly due to [facility])        Exercises General Exercises - Upper Extremity Shoulder Flexion: AROM, Both, 5 reps, Seated Shoulder ADduction: AROM, AAROM, Both, 5 reps, Supine (reaching for opposite bed rail) Elbow Flexion: AROM, Both, 10 reps, Supine, Seated (x5 reps ea posture) Elbow Extension: AROM, Both, 10 reps, Supine, Seated (x5 reps ea posture) Wrist Flexion: AROM, Both, 10 reps, Seated Wrist Extension: AROM, Both, 10 reps, Seated Digit Composite Flexion: AROM, Both, 10 reps, Seated Composite Extension: AROM, Both, 10 reps, Seated Chair Push Up:  (pt unable) General Exercises - Lower Extremity Ankle Circles/Pumps: AROM, Both, 10 reps, Supine Long Arc Quad: AROM, AAROM, Both, 10 reps, Seated Heel Slides: AAROM, Both, 5 reps, Supine Hip ABduction/ADduction: Both, 5 reps, Supine, PROM (very limited abd due to hip tightness) Hip Flexion/Marching: Other (comment) (unable even with PROM attempt in seated posture in hoyer lift, hips contracted and pain with attempt) Other Exercises Other Exercises: IS x 5 reps (~100-200 mL) Other Exercises: forward "cruches" in hoyer lift for BUE bicep strengthening pulling forward on rail Other Exercises: sidelying pushing/pulling on bed rail for bicep/tricep strengthening x5 reps ea side Other Exercises: gentle BUE AAROM chest press/bicep curls in for BUE strengthening    General Comments        Pertinent Vitals/Pain Pain Assessment Pain Assessment: Faces Faces Pain Scale: Hurts little more Pain Location: RLE>LLE, worst at R ankle and bilateral knees Pain Descriptors / Indicators: Discomfort, Grimacing, Moaning Pain Intervention(s): Limited activity within patient's tolerance, Monitored during session, Repositioned     PT Goals (current goals can now be found in the care plan section) Acute Rehab PT Goals Patient  Stated Goal: maintain strength and go back to LTC SNF PT Goal Formulation: With patient/family Time For Goal Achievement: 06/12/22 Progress towards PT goals: Progressing toward goals    Frequency    Min 1X/week      PT Plan Current plan remains appropriate       AM-PAC PT "6 Clicks" Mobility   Outcome Measure  Help needed turning from your back to your side while in a flat bed without using bedrails?: Total Help needed moving from lying on your back to sitting on the side of a flat bed without using bedrails?: Total Help needed moving to and from a bed to a chair (including a wheelchair)?: Total Help needed standing up from a chair using your arms (e.g., wheelchair or bedside chair)?: Total Help needed to walk in hospital room?: Total Help needed climbing 3-5 steps with a railing? : Total 6 Click Score: 6    End of Session Equipment Utilized During Treatment: Other (comment);Oxygen (lift) Activity Tolerance: Patient tolerated treatment well Patient left: in chair;with call bell/phone within reach;with family/visitor present Nurse Communication: Mobility status;Need for lift equipment PT Visit Diagnosis: Muscle weakness (generalized) (M62.81)     Time:  [id number] PT Time Calculation (min) (ACUTE ONLY): 29 min  Charges:  $Therapeutic Exercise: 8-22 mins $Therapeutic Activity: 8-22 mins                     Akeela Busk P., PTA Acute Rehabilitation Services Secure Chat Preferred 9a-5:30pm Office: [place] 06/03/2022, 2:09 PM

## 2022-06-03 NOTE — Consult Note (Signed)
WOC Nurse Consult Note: Reason for Consult: Unstageable pressure injuries to right posterior lower leg and right Achilles/heel.  Patient is immobile.  Is in SNF and hoyer lift is used for transfers.  Due to knee pain, she does not flex or move her legs easily.  Vascular team evaluated patient as outpatient recently and did not feel she was a surgical candidate to promote healing and recommended a Right AKA which was refused. At that time, the vascular service noted that they did perform some debridement of devitalized tissue.  Patient is unsure of her wound care at [facility] and is now is admitted with wound infection to right leg.   Wound type: Infectious, unstageable pressure Pressure Injury POA: Yes Measurement: Right posterior lower leg:  4 cm x 3 cm wound bed is eschar Right achilles/heel: 8 cm x 3 cm gray soft eschar to wound bed.  This wound has peeling epithelium to heel (likely from recent debridement) and is more moist than proximal wound.  Surrounding intact tissue to heel is maroon, consistent with deep tissue injury.  Both feet are in prevalon boots to offload pressure. I remove boots from both feet and inspect left leg and heel as well.  There is no breakdown noted.  Wound bed:100% devitalized tissue Drainage (amount, consistency, odor) moderate serosanguinous from distal (heel wound)  Scant serosanguinous from proximal wound.  I am going to order an antimicrobial dressing to both to protect.  Given vascular status and recommendations, topical wound care will be to prevent infection.  Patient is on vancomycin.   Periwound: Bruising to bilateral legs Dressing procedure/placement/frequency: Cleanse wounds to right posterior leg and heel with NS and pat dry.  Aquacel ([facility] # [id number]) to open wounds and cover with gauze.  Wrap with kerlix and tape. Change daily. May need to moisten with saline to remove.  Change daily.  Will not follow at this time.  Please re-consult if needed.  Estrellita Ludwig MSN, RN,  FNP-BC CWON Wound, Ostomy, Continence Nurse [facility] [phone] Pager [phone]

## 2022-06-03 NOTE — Progress Notes (Signed)
Mobility Specialist: Progress Note   06/03/22 1646  Mobility  Activity Transferred from chair to bed  Level of Assistance +2 (takes two people)  Assistive Device MaxiMove  Activity Response Tolerated well  Mobility Referral Yes  $Mobility charge 1 Mobility   Post-Mobility: 59 HR, 99% SpO2  Pt assisted back to bed per request with help from RN. No c/o throughout. Pt back in bed with RN present in the room.   [place] Emeric Novinger Mobility Specialist Please contact via SecureChat or Rehab office at [phone]

## 2022-06-03 NOTE — Progress Notes (Signed)
Mobility Specialist: Progress Note   06/03/22 1157  Mobility  Activity Transferred from bed to chair  Level of Assistance +2 (takes two people)  Assistive Device MaxiMove  Activity Response Tolerated well  Mobility Referral Yes  $Mobility charge 1 Mobility   Post-Mobility on 2 L/min [place]: 58 HR, 96% SpO2  Pt received in the chair and agreeable to mobility. Assisted to the chair with c/o bilateral knee pain when turning in the bed. Pt is in the chair with PTA in the room.   [place] Doyne Micke Mobility Specialist Please contact via SecureChat or Rehab office at [phone]

## 2022-06-03 NOTE — Progress Notes (Signed)
PROGRESS NOTE    [name]  [id number] DOB: Feb 27, 1923 DOA: 05/28/2022 PCP: Garwin Brothers, MD  Chief Complaint  Patient presents with   Wound Check    Brief Narrative:   [name] is [name] 89 y.o. female with medical history significant for peripheral arterial disease, hypertension, atrial fibrillation not on rate control or anticoagulation, being admitted to the hospital due to right lower extremity chronic ulceration.  Patient has known peripheral arterial disease, has been followed by vascular surgery.  She has Edynn Gillock history of this chronic right heel ulcer, saw vascular surgery Dr. Unk Lightning Mersadie Kavanaugh couple of weeks ago as an outpatient, he states that she is not [name] surgical candidate, especially due to being nonambulatory since she had [name] fracture last year of the right femur.  Transferred from [facility] to [facility] for evaluation by vascular surgery.   Assessment & Plan:   Principal Problem:   Leg ulcer, left Active Problems:   Atrial fibrillation   Diastolic heart failure   Edema   Bedbound   GERD (gastroesophageal reflux disease)  Nonhealing right leg wound/ulcer - progressive over past few weeks - appreciate vascular recommendations - R AKA due to extensive tissue loss and non ambulatory status.  She's refused this.  - MRI R foot questionable osseous exposure of calcaneal tuberosity, no definite cortical destruction or T1 marrow signal abnormality to confirm osteo.  Mild subcortical T2 hyperintensity within the posterior aspect of the calcaneal tuberosity maybe reactive or secondary to early osteo.   - follow ESR, CRP -> sed rate significantly elevated, CRP elevated as well, will assume osteo - will plan to discuss abx plan with ID tmrw - plan for wound care, appreciate wound care recs --- she'll need outpatient follow up with wound care center - I would recommend palliative care continue to follow her outpatient.  Long discussion with patient/family/friend, this plan of care  not curative, unlikely that wound is going to get better.  They express understanding, but [name] is adamantly against surgery.     Paroxysmal atrial fibrillation (HCC) Will slow ventricular response Asymptomatic  she does not appear to be on anticoagulation as she is high risk especially with falls.    Chronic Diastolic heart failure (HCC)  -continue Lasix, no evidence of exacerbation.  Very minimal bilateral lower extremity pitting edema which is likely due to immobility.   Bedbound Seen by PT OT.   GERD (gastroesophageal reflux disease)  -continue p.o. PPI   Goals Of care Palliative care on board.   Hypothyroidism-continue levothyroxine     DVT prophylaxis: lovenox Code Status: full Family Communication: none Disposition:   Status is: Inpatient Remains inpatient appropriate because: pending additional w/u    Consultants:  vascular  Procedures:  none  Antimicrobials:  Anti-infectives (From admission, onward)    Start     Dose/Rate Route Frequency Ordered Stop   06/02/22 1000  vancomycin (VANCOREADY) IVPB 750 mg/150 mL        750 mg 150 mL/hr over 60 Minutes Intravenous Every 24 hours 06/01/22 0829     05/29/22 0600  ceFEPIme (MAXIPIME) 2 g in sodium chloride 0.9 % 100 mL IVPB        2 g 200 mL/hr over 30 Minutes Intravenous Every 12 hours 05/28/22 1833     05/29/22 0600  vancomycin (VANCOCIN) IVPB 1000 mg/200 mL premix  Status:  Discontinued        1,000 mg 200 mL/hr over 60 Minutes Intravenous Every 24 hours 05/28/22 2059  06/01/22 0829   05/28/22 1745  ceFEPIme (MAXIPIME) 2 g in sodium chloride 0.9 % 100 mL IVPB        2 g 200 mL/hr over 30 Minutes Intravenous  Once 05/28/22 1742 05/28/22 1957   05/28/22 1415  vancomycin (VANCOCIN) IVPB 1000 mg/200 mL premix        1,000 mg 200 mL/hr over 60 Minutes Intravenous  Once 05/28/22 1413 05/28/22 1735       Subjective: Not interested in surgery Long conversation with nephew and friend  Jan  Objective: Vitals:   06/02/22 2349 06/03/22 0330 06/03/22 0434 06/03/22 0731  BP: (!) 127/56 (!) 136/58  139/60  Pulse: (!) 53 (!) 58  (!) 54  Resp: (!) 21 18  18   Temp: 98.3 F (36.8 C) 98 F (36.7 C)  97.6 F (36.4 C)  TempSrc: Oral Oral  Oral  SpO2: 98% 98%  98%  Weight:   86.1 kg   Height:        Intake/Output Summary (Last 24 hours) at 06/03/2022 1820 Last data filed at 06/03/2022 0524 Gross per 24 hour  Intake 580 ml  Output 350 ml  Net 230 ml   Filed Weights   05/30/22 0438 06/02/22 0530 06/03/22 0434  Weight: 87.4 kg 90.1 kg 86.1 kg    Examination:  General exam: Appears calm and comfortable  Respiratory system: unlabored Cardiovascular system: RRR Gastrointestinal system: Abdomen is nondistended, soft and nontender.  Central nervous system: Alert and oriented. No focal neurological deficits. Extremities: RLE dressing in place, prevalon boots  - discussing with RN for timing of dressing change so I can evaluate wound   Data Reviewed: I have personally reviewed following labs and imaging studies  CBC: Recent Labs  Lab 05/28/22 1544 05/29/22 0112 05/30/22 0114 05/31/22 0608  WBC 13.2* 13.0* 13.9* 11.6*  NEUTROABS 9.4*  --   --   --   HGB 11.8* 11.4* 11.4* 10.8*  HCT 37.2 34.3* 33.8* 31.3*  MCV 95.9 93.0 92.1 90.5  PLT 348 317 350 [id number]    Basic Metabolic Panel: Recent Labs  Lab 05/28/22 1544 05/29/22 0112 05/30/22 0114 05/31/22 0608 06/02/22 0440  NA 138 136 137 137  --   K 4.0 3.7 3.5 3.5  --   CL 101 100 98 99  --   CO2 29 26 28 28   --   GLUCOSE 112* 108* 108* 99  --   BUN 27* 20 20 21   --   CREATININE 0.59 0.60 0.80 0.67 0.71  CALCIUM 8.4* 8.4* 8.4* 8.5*  --   PHOS  --   --  2.7  --   --     GFR: Estimated Creatinine Clearance: 46.2 mL/min (by C-G formula based on SCr of 0.71 mg/dL).  Liver Function Tests: No results for input(s): "AST", "ALT", "ALKPHOS", "BILITOT", "PROT", "ALBUMIN" in the last 168 hours.  CBG: No results for  input(s): "GLUCAP" in the last 168 hours.   Recent Results (from the past 240 hour(s))  Culture, blood (Routine X 2) w Reflex to ID Panel     Status: None   Collection Time: 05/28/22  3:44 PM   Specimen: Left Antecubital; Blood  Result Value Ref Range Status   Specimen Description   Final    LEFT ANTECUBITAL BLOOD Performed at [facility] [address] [place], [place] [address]    Special Requests   Final    BOTTLES DRAWN AEROBIC AND ANAEROBIC Blood Culture adequate volume Performed at [facility]  [place] [address] [place], [place] [address]    Culture   Final    NO GROWTH 5 DAYS Performed at [facility], [place] [address], [place], [place] [address]    Report Status 06/02/2022 FINAL  Final  Culture, blood (Routine X 2) w Reflex to ID Panel     Status: None   Collection Time: 05/28/22  3:44 PM   Specimen: BLOOD LEFT FOREARM  Result Value Ref Range Status   Specimen Description   Final    BLOOD LEFT FOREARM Performed at [facility] [address], [place], [place] [address]    Special Requests   Final    BOTTLES DRAWN AEROBIC AND ANAEROBIC Blood Culture adequate volume Performed at [facility] [address] [place], [place] [address]    Culture   Final    NO GROWTH 5 DAYS Performed at [facility], [place] [address], [place], [place] [address]    Report Status 06/02/2022 FINAL  Final         Radiology Studies: MR FOOT RIGHT WO CONTRAST  Result Date: 06/02/2022 CLINICAL DATA:  Evaluate for osteomyelitis in the right hindfoot and ankle. EXAM: MRI OF THE RIGHT HINDFOOT WITHOUT CONTRAST TECHNIQUE: Multiplanar, multisequence MR imaging of the right ankle and hindfoot was performed. No intravenous contrast was administered. COMPARISON:  Foot and ankle radiographs 05/28/2022. FINDINGS: The foot could not be flexed due to bandaging. Despite efforts by the technologist and patient, mild motion  artifact is present on today's exam and could not be eliminated. This reduces exam sensitivity and specificity. Bones/Joint/Cartilage The soft tissues appear thinned and irregular over the calcaneal tuberosity. There is questionable osseous exposure of the calcaneal tuberosity. No cortical destruction or definite T1 marrow signal abnormality is identified. On the T2 weighted images, there is subcortical T2 hyperintensity within the posterior aspect of the calcaneal tuberosity. No other significant osseous findings are seen within the hindfoot or midfoot. There are no significant ankle or subtalar joint effusions. Mild midfoot degenerative changes. Ligaments Intact Lisfranc ligament. The ankle tendons are suboptimally evaluated due to positioning, although appear intact. Muscles and Tendons The ankle tendons appear intact with normal signal. No tenosynovitis. Mild nonspecific muscular atrophy. Soft tissues As above, apparent soft tissue thinning and irregularity over the calcaneal tuberosity. No underlying focal fluid collection. Mild subcutaneous edema in the hindfoot. IMPRESSION: 1. Apparent soft tissue thinning and irregularity over the calcaneal tuberosity with questionable osseous exposure of the calcaneal tuberosity. No focal fluid collection. 2. No definite cortical destruction or T1 marrow signal abnormality to confirm osteomyelitis. Mild subcortical T2 hyperintensity within the posterior aspect of the calcaneal tuberosity may be reactive or secondary to early osteomyelitis. If there is osseous exposure of the calcaneal tuberosity, early osteomyelitis suspected. 3. The ankle tendons and ligaments appear intact. 4. Mild midfoot degenerative changes. Electronically Signed   By: Richardean Sale M.D.   On: 06/02/2022 13:29        Scheduled Meds:  acetaminophen  650 mg Oral Q6H   calcium-vitamin D  1 tablet Oral Daily   enoxaparin (LOVENOX) injection  40 mg Subcutaneous Daily   feeding supplement  237 mL  Oral TID BM   furosemide  40 mg Oral Daily   levothyroxine  50 mcg Oral Daily   mometasone-formoterol  2 puff Inhalation BID   pantoprazole  40 mg Oral Daily   polyethylene glycol  17 g Oral Daily   Continuous Infusions:  ceFEPime (MAXIPIME) IV 2 g (06/03/22 1727)   vancomycin 750 mg (06/03/22 0847)     LOS: 6 days    Time spent: over 63 min    Fayrene Helper, MD [facility]   To contact the attending provider between 7A-7P or the covering provider during after hours 7P-7A, please log into the web site [email or web address] and access using universal [place] password for that web site. If you do not have the password, please call the hospital operator.  06/03/2022, 6:20 PM

## 2022-06-04 DIAGNOSIS — M86171 Other acute osteomyelitis, right ankle and foot: Secondary | ICD-10-CM | POA: Diagnosis not present

## 2022-06-04 DIAGNOSIS — L89890 Pressure ulcer of other site, unstageable: Secondary | ICD-10-CM

## 2022-06-04 DIAGNOSIS — I739 Peripheral vascular disease, unspecified: Secondary | ICD-10-CM | POA: Diagnosis not present

## 2022-06-04 DIAGNOSIS — M869 Osteomyelitis, unspecified: Secondary | ICD-10-CM

## 2022-06-04 LAB — CBC WITH DIFFERENTIAL/PLATELET
Abs Immature Granulocytes: 0.08 10*3/uL — ABNORMAL HIGH (ref 0.00–0.07)
Basophils Absolute: 0.1 10*3/uL (ref 0.0–0.1)
Basophils Relative: 1 %
Eosinophils Absolute: 0.6 10*3/uL — ABNORMAL HIGH (ref 0.0–0.5)
Eosinophils Relative: 6 %
HCT: 32.7 % — ABNORMAL LOW (ref 36.0–46.0)
Hemoglobin: 11.1 g/dL — ABNORMAL LOW (ref 12.0–15.0)
Immature Granulocytes: 1 %
Lymphocytes Relative: 14 %
Lymphs Abs: 1.4 10*3/uL (ref 0.7–4.0)
MCH: 31 pg (ref 26.0–34.0)
MCHC: 33.9 g/dL (ref 30.0–36.0)
MCV: 91.3 fL (ref 80.0–100.0)
Monocytes Absolute: 1.3 10*3/uL — ABNORMAL HIGH (ref 0.1–1.0)
Monocytes Relative: 13 %
Neutro Abs: 6.6 10*3/uL (ref 1.7–7.7)
Neutrophils Relative %: 65 %
Platelets: 341 10*3/uL (ref 150–400)
RBC: 3.58 MIL/uL — ABNORMAL LOW (ref 3.87–5.11)
RDW: 14.3 % (ref 11.5–15.5)
WBC: 10.1 10*3/uL (ref 4.0–10.5)
nRBC: 0 % (ref 0.0–0.2)

## 2022-06-04 LAB — COMPREHENSIVE METABOLIC PANEL
ALT: 15 U/L (ref 0–44)
AST: 21 U/L (ref 15–41)
Albumin: 1.9 g/dL — ABNORMAL LOW (ref 3.5–5.0)
Alkaline Phosphatase: 75 U/L (ref 38–126)
Anion gap: 11 (ref 5–15)
BUN: 30 mg/dL — ABNORMAL HIGH (ref 8–23)
CO2: 30 mmol/L (ref 22–32)
Calcium: 8.7 mg/dL — ABNORMAL LOW (ref 8.9–10.3)
Chloride: 99 mmol/L (ref 98–111)
Creatinine, Ser: 0.77 mg/dL (ref 0.44–1.00)
GFR, Estimated: >60 mL/min (ref >60)
Glucose, Bld: 96 mg/dL (ref 70–99)
Potassium: 3.3 mmol/L — ABNORMAL LOW (ref 3.5–5.1)
Sodium: 140 mmol/L (ref 135–145)
Total Bilirubin: 0.6 mg/dL (ref 0.3–1.2)
Total Protein: 6.2 g/dL — ABNORMAL LOW (ref 6.5–8.1)

## 2022-06-04 LAB — MAGNESIUM: Magnesium: 1.9 mg/dL (ref 1.7–2.4)

## 2022-06-04 LAB — PHOSPHORUS: Phosphorus: 2.7 mg/dL (ref 2.5–4.6)

## 2022-06-04 MED ORDER — DOXYCYCLINE HYCLATE 100 MG PO TABS
100.0000 mg | ORAL_TABLET | Freq: Two times a day (BID) | ORAL | Status: DC
  Administered 2022-06-04 – 2022-06-05 (×3): 100 mg via ORAL
  Filled 2022-06-04 (×3): qty 1

## 2022-06-04 MED ORDER — AMOXICILLIN-POT CLAVULANATE 875-125 MG PO TABS
1.0000 | ORAL_TABLET | Freq: Two times a day (BID) | ORAL | Status: DC
  Administered 2022-06-04 – 2022-06-05 (×3): 1 via ORAL
  Filled 2022-06-04 (×3): qty 1

## 2022-06-04 MED ORDER — POTASSIUM CHLORIDE CRYS ER 20 MEQ PO TBCR
40.0000 meq | EXTENDED_RELEASE_TABLET | Freq: Once | ORAL | Status: AC
  Administered 2022-06-04: 40 meq via ORAL
  Filled 2022-06-04: qty 2

## 2022-06-04 NOTE — Progress Notes (Signed)
During the night, while the patient was sleeping, her heart rate would drop down to the 30s & 40s, continuously.  When she was awakened, her heart rate would go back up to 50s and 60s. Her blood pressure has been stable.  Will continue to monitor.  Lupita Dawn, RN

## 2022-06-04 NOTE — Progress Notes (Signed)
Mobility Specialist: Progress Note   06/04/22 1005  Mobility  Activity Transferred from bed to chair  Level of Assistance +2 (takes two people)  Assistive Device MaxiMove  Activity Response Tolerated fair  Mobility Referral Yes  $Mobility charge 1 Mobility   Pre-Mobility: 58 HR, 99% SpO2 Post-Mobility: 64 HR, 96% SpO2  Pt received in the bed and agreeable to mobility. Assisted to the chair with help from NT. C/o bilateral knee pain, otherwise asymptomatic. Pt is in the chair with call bell and phone in her lap.   [place] [name] Mobility Specialist Please contact via SecureChat or Rehab office at [phone]

## 2022-06-04 NOTE — Progress Notes (Signed)
Pharmacy Antibiotic Note  [name] is a 89 y.o. female admitted on 05/28/2022 with wound infection and possible osteomyelitis.  Pharmacy has been consulted for vancomycin dosing; cefepime per MD.   Patient's renal function has remained stable with CrCl 40-50 mL/min.   Plan: -Continue vancomycin to 750mg  Q24H (eAUC 478 based on previous levels) -Recheck vancomycin level this week  -Continue cefepime 2g Q12H -F/u long-term antibiotic plans. Patient currently declining amputation.  Height: 5\' 8"  (172.7 cm) Weight: 86.1 kg (189 lb 13.1 oz) IBW/kg (Calculated) : 63.9  Temp (24hrs), Avg:97.5 F (36.4 C), Min:97.3 F (36.3 C), Max:97.8 F (36.6 C)  Recent Labs  Lab 05/28/22 1544 05/29/22 0112 05/30/22 0114 05/31/22 0608 05/31/22 0805 06/01/22 0609 06/02/22 0440 06/04/22 0530  WBC 13.2* 13.0* 13.9* 11.6*  --   --   --  10.1  CREATININE 0.59 0.60 0.80 0.67  --   --  0.71 0.77  LATICACIDVEN 1.9  --   --   --   --   --   --   --   VANCOTROUGH  --   --   --   --   --  17  --   --   VANCOPEAK  --   --   --   --  38  --   --   --      Estimated Creatinine Clearance: 46.2 mL/min (by C-G formula based on SCr of 0.77 mg/dL).    Allergies  Allergen Reactions   Morphine And Related Other (See Comments)    Other Reaction: mild Intolerance, Allergy   Sulfa Antibiotics     Other reaction(s): Other (See Comments) Other Reaction: mild Intolerance, Allergy   Meperidine Hcl Nausea Only   Nsaids     Other reaction(s): Other (See Comments) Bleeding ulcer   Tolmetin Other (See Comments)    Other reaction(s): Other (See Comments) Bleeding ulcer    Antimicrobials this admission: Vancomycin 3/28 >>  Cefepime 3/28 >>  Dose adjustments this admission: 3/31 VP/VT 38/17 (calc AUC 637) on 1000 Q24H >> 750 Q24H (eAUC 478)  Microbiology results: 3/28 BCx: Neg  Thank you for allowing pharmacy to be a part of this patient's care.  Hildred Laser, PharmD Clinical  Pharmacist **Pharmacist phone directory can now be found on [email or web address] ([email or web address]).  Listed under [place].

## 2022-06-04 NOTE — Consult Note (Signed)
[facility] for Infectious Disease  Total days of antibiotics 8       Reason for Consult: right calcaneal osteo/pressure wound   Referring Physician: powell  Principal Problem:   Leg ulcer, left Active Problems:   Atrial fibrillation   Diastolic heart failure   Edema   Bedbound   GERD (gastroesophageal reflux disease)    HPI: ARUSHI ANSARA is a 89 y.o. female with hx of PAD, afebi, HTN, who was admitted from [facility] for worsening wound to right heel and lower leg. Her health advocate, [name], was able to tell me that she first was informed that Jaydah had a pressure wound measuring 3 x 3 cm in the first week of jan and over the weeks it continued to worsen. She has hx of right heel ulcer that improved over the course of a year through wound care clinic.   She did see vascular surgery as outpatient who felt that worsening of wound could also be due to her known vascular insufficiency. Felt that she was not a surgical candidate due to risk of surgery  Patient has been receiving vancomycin and cefepime for a week, erythema somewhat improved, mri suggests osteomyelitis, seen by wound care nurse that recommended antimicrobial dressing and off loading for the unstageable pressure injury due to large eschar measuring 8 cm x 3 cm and 4 cm x 3 cm. Orthopedic surgery felt that she would unlikely heal and recommended AKA but patient wants to avoid surgery. ID asked to weigh in on medical management  Does not ambulate much since last summer had periprosthetic femur fracture s/p stabilization. Wheelchair bound Past Medical History:  Diagnosis Date   Atrial fibrillation ([place])    CARCINOMA, BREAST    CHEST PAIN    DEGENERATIVE JOINT DISEASE    Diastolic heart failure ([place]) 08/03/2008   Qualifier: Diagnosis of  By: Burnett Kanaris     Difficult intubation    Edema    GIB (gastrointestinal bleeding) 05/24/2015   Humerus fracture 09/12/2020   HYPERTENSION    Long term  (current) use of anticoagulants    Malignant neoplasm of female breast ([place]) 08/03/2008   Qualifier: Diagnosis of  By: Burnett Kanaris     Unspecified diastolic heart failure     Allergies:  Allergies  Allergen Reactions   Morphine And Related Other (See Comments)    Other Reaction: mild Intolerance, Allergy   Sulfa Antibiotics     Other reaction(s): Other (See Comments) Other Reaction: mild Intolerance, Allergy   Meperidine Hcl Nausea Only   Nsaids     Other reaction(s): Other (See Comments) Bleeding ulcer   Tolmetin Other (See Comments)    Other reaction(s): Other (See Comments) Bleeding ulcer   MEDICATIONS:  acetaminophen  650 mg Oral Q6H   amoxicillin-clavulanate  1 tablet Oral Q12H   calcium-vitamin D  1 tablet Oral Daily   doxycycline  100 mg Oral Q12H   enoxaparin (LOVENOX) injection  40 mg Subcutaneous Daily   feeding supplement  237 mL Oral TID BM   furosemide  40 mg Oral Daily   levothyroxine  50 mcg Oral Daily   mometasone-formoterol  2 puff Inhalation BID   pantoprazole  40 mg Oral Daily   polyethylene glycol  17 g Oral Daily    Social History   Tobacco Use   Smoking status: Never   Smokeless tobacco: Never  Vaping Use   Vaping Use: Never used  Substance Use Topics   Alcohol use:  No   Drug use: No    Family History  Problem Relation Age of Onset   CAD Mother    Heart attack Mother    Hypertension Mother    CAD Father    Stroke Brother    Heart attack Brother     Review of Systems -  +right leg pain. +decreased hearing. 12 point ros otherwise negative  OBJECTIVE: Temp:  [97.3 F (36.3 C)-97.8 F (36.6 C)] 97.6 F (36.4 C) (04/04 0728) Pulse Rate:  [56-59] 56 (04/04 0728) Resp:  [14-20] 14 (04/04 0728) BP: (120-144)/(57-74) 130/72 (04/04 0728) SpO2:  [97 %-100 %] 97 % (04/04 0728) Physical Exam  Constitutional:  oriented to person, place, and time. appears well-developed and well-nourished. No distress.  HENT: [place]/AT, PERRLA, no  scleral icterus Mouth/Throat: Oropharynx is clear and moist. No oropharyngeal exudate.  Cardiovascular: Normal rate, regular rhythm and normal heart sounds. Exam reveals no gallop and no friction rub.  No murmur heard.  Pulmonary/Chest: Effort normal and breath sounds normal. No respiratory distress.  has no wheezes.  Neck = supple, no nuchal rigidity Abdominal: Soft. Bowel sounds are normal.  exhibits no distension. There is no tenderness.  Lymphadenopathy: no cervical adenopathy. No axillary adenopathy Neurological: alert and oriented to person, place, and time.  Skin: Skin is warm and dry. See photos below from exam today Psychiatric: a normal mood and affect.  behavior is normal.       LABS: Results for orders placed or performed during the hospital encounter of 05/28/22 (from the past 48 hour(s))  C-reactive protein     Status: Abnormal   Collection Time: 06/03/22 12:15 PM  Result Value Ref Range   CRP 8.6 (H) <1.0 mg/dL    Comment: Performed at [facility], [address] [place], [place] [address]  Sedimentation rate     Status: Abnormal   Collection Time: 06/03/22 12:15 PM  Result Value Ref Range   Sed Rate >140 (H) 0 - 22 mm/hr    Comment: Performed at [facility] [address] [place], [place] [address]  Comprehensive metabolic panel     Status: Abnormal   Collection Time: 06/04/22  5:30 AM  Result Value Ref Range   Sodium 140 135 - 145 mmol/L   Potassium 3.3 (L) 3.5 - 5.1 mmol/L   Chloride 99 98 - 111 mmol/L   CO2 30 22 - 32 mmol/L   Glucose, Bld 96 70 - 99 mg/dL    Comment: Glucose reference range applies only to samples taken after fasting for at least 8 hours.   BUN 30 (H) 8 - 23 mg/dL   Creatinine, Ser 0.77 0.44 - 1.00 mg/dL   Calcium 8.7 (L) 8.9 - 10.3 mg/dL   Total Protein 6.2 (L) 6.5 - 8.1 g/dL   Albumin 1.9 (L) 3.5 - 5.0 g/dL   AST 21 15 - 41 U/L   ALT 15 0 - 44 U/L   Alkaline Phosphatase 75 38 - 126 U/L   Total Bilirubin 0.6 0.3 - 1.2  mg/dL   GFR, Estimated >60 >60 mL/min    Comment: (NOTE) Calculated using the CKD-EPI Creatinine Equation (2021)    Anion gap 11 5 - 15    Comment: Performed at [facility], [place] [address] [place], [place] [address]  CBC with Differential/Platelet     Status: Abnormal   Collection Time: 06/04/22  5:30 AM  Result Value Ref Range   WBC 10.1 4.0 - 10.5  K/uL   RBC 3.58 (L) 3.87 - 5.11 MIL/uL   Hemoglobin 11.1 (L) 12.0 - 15.0 g/dL   HCT 32.7 (L) 36.0 - 46.0 %   MCV 91.3 80.0 - 100.0 fL   MCH 31.0 26.0 - 34.0 pg   MCHC 33.9 30.0 - 36.0 g/dL   RDW 14.3 11.5 - 15.5 %   Platelets 341 150 - 400 K/uL   nRBC 0.0 0.0 - 0.2 %   Neutrophils Relative % 65 %   Neutro Abs 6.6 1.7 - 7.7 K/uL   Lymphocytes Relative 14 %   Lymphs Abs 1.4 0.7 - 4.0 K/uL   Monocytes Relative 13 %   Monocytes Absolute 1.3 (H) 0.1 - 1.0 K/uL   Eosinophils Relative 6 %   Eosinophils Absolute 0.6 (H) 0.0 - 0.5 K/uL   Basophils Relative 1 %   Basophils Absolute 0.1 0.0 - 0.1 K/uL   Immature Granulocytes 1 %   Abs Immature Granulocytes 0.08 (H) 0.00 - 0.07 K/uL    Comment: Performed at [facility], [address] [place], [place] [address]  Magnesium     Status: None   Collection Time: 06/04/22  5:30 AM  Result Value Ref Range   Magnesium 1.9 1.7 - 2.4 mg/dL    Comment: Performed at [facility] [address], [place], [place] [address]  Phosphorus     Status: None   Collection Time: 06/04/22  5:30 AM  Result Value Ref Range   Phosphorus 2.7 2.5 - 4.6 mg/dL    Comment: Performed at [facility] [address], [place],  [address]    MICRO: 3/28 blood cx NGTD  IMAGING: Mri of right foot 4/2   IMPRESSION: 1. Apparent soft tissue thinning and irregularity over the calcaneal tuberosity with questionable osseous exposure of the calcaneal tuberosity. No focal fluid collection. 2. No definite cortical destruction or T1 marrow signal abnormality to confirm osteomyelitis. Mild  subcortical T2 hyperintensity within the posterior aspect of the calcaneal tuberosity may be reactive or secondary to early osteomyelitis. If there is osseous exposure of the calcaneal tuberosity, early osteomyelitis suspected. 3. The ankle tendons and ligaments appear intact. 4. Mild midfoot degenerative changes.  Assessment/Plan:  89 yo F with PAD with worsening pressure ulcer to right heel and lower leg with early osteomyelitis to calcaneous. At risk for worsening given vascular compromise but also risk for surgery is not insignificant.   - plan for 6-8 wk of oral abtx suppression with doxycycline 100mg  po bid plus amox/clav 875mg  bid. - watch side effects of nausea with doxy. If it occurs, can schedule dose with meals to reduce side effect - will recommend to go back with wound care clinic  - discussed treatment plan with patient, nephew and health advocate that abtx alone will not necessarily cure what has happened but may just keep systemic infection at [place]. Ultimately surgery is definitive treatment but maybe too high of risk for patient. - will see back in 2-3 wk in video visit  Havish Petties [facility] Infectious [facility] [phone]

## 2022-06-04 NOTE — Progress Notes (Signed)
PROGRESS NOTE    [name]  [id number] DOB: 09/06/1922 DOA: 05/28/2022 PCP: Garwin Brothers, MD  Chief Complaint  Patient presents with   Wound Check    Brief Narrative:   [name] is [name] 89 y.o. female with medical history significant for peripheral arterial disease, hypertension, atrial fibrillation not on rate control or anticoagulation, being admitted to the hospital due to right lower extremity chronic ulceration.  Patient has known peripheral arterial disease, has been followed by vascular surgery.  She has Matej Sappenfield history of this chronic right heel ulcer, saw vascular surgery Dr. Unk Lightning Domanic Matusek couple of weeks ago as an outpatient, he states that she is not Joud Pettinato surgical candidate, especially due to being nonambulatory since she had Amario Longmore fracture last year of the right femur.  Transferred from [place] to [facility] for evaluation by vascular surgery.   Assessment & Plan:   Principal Problem:   Leg ulcer, left Active Problems:   Atrial fibrillation   Diastolic heart failure   Edema   Bedbound   GERD (gastroesophageal reflux disease)  Nonhealing right leg wound/ulcer - progressive over past few weeks - appreciate vascular recommendations - R AKA due to extensive tissue loss and non ambulatory status.  She's refused this.  - MRI R foot questionable osseous exposure of calcaneal tuberosity, no definite cortical destruction or T1 marrow signal abnormality to confirm osteo.  Mild subcortical T2 hyperintensity within the posterior aspect of the calcaneal tuberosity maybe reactive or secondary to early osteo.   - sed rate significantly elevated, CRP elevated as well, will assume osteo - plan for wound care, appreciate wound care recs --- she'll need outpatient follow up with wound care center - Augmentin/doxy x 6-8 weeks per ID, they'll continue to follow  - I would recommend palliative care continue to follow her outpatient.  Long discussion with patient/family/friend, this plan of care  not curative, unlikely that wound is going to get better.  They express understanding, but [name] is adamantly against surgery.  An AKA is certainly not without significant risks for her.  For now, based on our discussion, planning wound care and suppressive abx with assistance of ID.   Paroxysmal atrial fibrillation (HCC) Will slow ventricular response Asymptomatic  she does not appear to be on anticoagulation as she is high risk especially with falls.    Chronic Diastolic heart failure (HCC)  -continue Lasix, no evidence of exacerbation.  Very minimal bilateral lower extremity pitting edema which is likely due to immobility.   Bedbound Seen by PT OT.   GERD (gastroesophageal reflux disease)  -continue p.o. PPI   Goals Of care Palliative care on board.   Hypothyroidism-continue levothyroxine     DVT prophylaxis: lovenox Code Status: full Family Communication: none Disposition:   Status is: Inpatient Remains inpatient appropriate because: pending additional w/u    Consultants:  vascular  Procedures:  none  Antimicrobials:  Anti-infectives (From admission, onward)    Start     Dose/Rate Route Frequency Ordered Stop   06/02/22 1000  vancomycin (VANCOREADY) IVPB 750 mg/150 mL        750 mg 150 mL/hr over 60 Minutes Intravenous Every 24 hours 06/01/22 0829     05/29/22 0600  ceFEPIme (MAXIPIME) 2 g in sodium chloride 0.9 % 100 mL IVPB        2 g 200 mL/hr over 30 Minutes Intravenous Every 12 hours 05/28/22 1833     05/29/22 0600  vancomycin (VANCOCIN) IVPB 1000 mg/200 mL premix  Status:  Discontinued        1,000 mg 200 mL/hr over 60 Minutes Intravenous Every 24 hours 05/28/22 2059 06/01/22 0829   05/28/22 1745  ceFEPIme (MAXIPIME) 2 g in sodium chloride 0.9 % 100 mL IVPB        2 g 200 mL/hr over 30 Minutes Intravenous  Once 05/28/22 1742 05/28/22 1957   05/28/22 1415  vancomycin (VANCOCIN) IVPB 1000 mg/200 mL premix        1,000 mg 200 mL/hr over 60  Minutes Intravenous  Once 05/28/22 1413 05/28/22 1735       Subjective: No complaints Gets anxious when discussing the wound  Long discussion with Jan and nephew Roderic Palau - reiterated that wound care/abx unlikely to cure her wound, this is more of Avaeh Ewer palliative route.  She's at risk for complications from infection and with chronic wound going forward.  Not Lillee Mooneyhan perfect answer in this situation.    Objective: Vitals:   06/02/22 2349 06/03/22 0330 06/03/22 0434 06/03/22 0731  BP: (!) 127/56 (!) 136/58  139/60  Pulse: (!) 53 (!) 58  (!) 54  Resp: (!) 21 18  18   Temp: 98.3 F (36.8 C) 98 F (36.7 C)  97.6 F (36.4 C)  TempSrc: Oral Oral  Oral  SpO2: 98% 98%  98%  Weight:   86.1 kg   Height:        Intake/Output Summary (Last 24 hours) at 06/03/2022 1820 Last data filed at 06/03/2022 0524 Gross per 24 hour  Intake 580 ml  Output 350 ml  Net 230 ml   Filed Weights   05/30/22 0438 06/02/22 0530 06/03/22 0434  Weight: 87.4 kg 90.1 kg 86.1 kg    Examination:  General exam: Appears calm and comfortable  Respiratory system: unlabored Cardiovascular system: RRR Central nervous system: Alert and oriented. No focal neurological deficits. Extremities: images reviewed today, RN had just done dressing change with ID before I walked into room    Data Reviewed: I have personally reviewed following labs and imaging studies  CBC: Recent Labs  Lab 05/28/22 1544 05/29/22 0112 05/30/22 0114 05/31/22 0608  WBC 13.2* 13.0* 13.9* 11.6*  NEUTROABS 9.4*  --   --   --   HGB 11.8* 11.4* 11.4* 10.8*  HCT 37.2 34.3* 33.8* 31.3*  MCV 95.9 93.0 92.1 90.5  PLT 348 317 350 [id number]    Basic Metabolic Panel: Recent Labs  Lab 05/28/22 1544 05/29/22 0112 05/30/22 0114 05/31/22 0608 06/02/22 0440  NA 138 136 137 137  --   K 4.0 3.7 3.5 3.5  --   CL 101 100 98 99  --   CO2 29 26 28 28   --   GLUCOSE 112* 108* 108* 99  --   BUN 27* 20 20 21   --   CREATININE 0.59 0.60 0.80 0.67 0.71  CALCIUM  8.4* 8.4* 8.4* 8.5*  --   PHOS  --   --  2.7  --   --     GFR: Estimated Creatinine Clearance: 46.2 mL/min (by C-G formula based on SCr of 0.71 mg/dL).  Liver Function Tests: No results for input(s): "AST", "ALT", "ALKPHOS", "BILITOT", "PROT", "ALBUMIN" in the last 168 hours.  CBG: No results for input(s): "GLUCAP" in the last 168 hours.   Recent Results (from the past 240 hour(s))  Culture, blood (Routine X 2) w Reflex to ID Panel     Status: None   Collection Time: 05/28/22  3:44 PM   Specimen: Left Antecubital;  Blood  Result Value Ref Range Status   Specimen Description   Final    LEFT ANTECUBITAL BLOOD Performed at [facility] [address] [place], [place] [address]    Special Requests   Final    BOTTLES DRAWN AEROBIC AND ANAEROBIC Blood Culture adequate volume Performed at [facility] [address] [place], [place] [address]    Culture   Final    NO GROWTH 5 DAYS Performed at [facility], [place] [address] [place], [place] [address]    Report Status 06/02/2022 FINAL  Final  Culture, blood (Routine X 2) w Reflex to ID Panel     Status: None   Collection Time: 05/28/22  3:44 PM   Specimen: BLOOD LEFT FOREARM  Result Value Ref Range Status   Specimen Description   Final    BLOOD LEFT FOREARM Performed at [facility] [address], [place], [place] [address]    Special Requests   Final    BOTTLES DRAWN AEROBIC AND ANAEROBIC Blood Culture adequate volume Performed at [facility] [address], [place], [place] [address]    Culture   Final    NO GROWTH 5 DAYS Performed at [facility], [place] [address] [place], [place] [address]    Report Status 06/02/2022 FINAL  Final         Radiology Studies: MR FOOT RIGHT WO CONTRAST  Result Date: 06/02/2022 CLINICAL DATA:  Evaluate for osteomyelitis in the right hindfoot and ankle. EXAM: MRI OF THE RIGHT HINDFOOT WITHOUT CONTRAST  TECHNIQUE: Multiplanar, multisequence MR imaging of the right ankle and hindfoot was performed. No intravenous contrast was administered. COMPARISON:  Foot and ankle radiographs 05/28/2022. FINDINGS: The foot could not be flexed due to bandaging. Despite efforts by the technologist and patient, mild motion artifact is present on today's exam and could not be eliminated. This reduces exam sensitivity and specificity. Bones/Joint/Cartilage The soft tissues appear thinned and irregular over the calcaneal tuberosity. There is questionable osseous exposure of the calcaneal tuberosity. No cortical destruction or definite T1 marrow signal abnormality is identified. On the T2 weighted images, there is subcortical T2 hyperintensity within the posterior aspect of the calcaneal tuberosity. No other significant osseous findings are seen within the hindfoot or midfoot. There are no significant ankle or subtalar joint effusions. Mild midfoot degenerative changes. Ligaments Intact Lisfranc ligament. The ankle tendons are suboptimally evaluated due to positioning, although appear intact. Muscles and Tendons The ankle tendons appear intact with normal signal. No tenosynovitis. Mild nonspecific muscular atrophy. Soft tissues As above, apparent soft tissue thinning and irregularity over the calcaneal tuberosity. No underlying focal fluid collection. Mild subcutaneous edema in the hindfoot. IMPRESSION: 1. Apparent soft tissue thinning and irregularity over the calcaneal tuberosity with questionable osseous exposure of the calcaneal tuberosity. No focal fluid collection. 2. No definite cortical destruction or T1 marrow signal abnormality to confirm osteomyelitis. Mild subcortical T2 hyperintensity within the posterior aspect of the calcaneal tuberosity may be reactive or secondary to early osteomyelitis. If there is osseous exposure of the calcaneal tuberosity, early osteomyelitis suspected. 3. The ankle tendons and ligaments appear  intact. 4. Mild midfoot degenerative changes. Electronically Signed   By: Richardean Sale M.D.   On: 06/02/2022 13:29        Scheduled Meds:  acetaminophen  650 mg Oral Q6H   calcium-vitamin D  1 tablet Oral Daily   enoxaparin (LOVENOX) injection  40 mg Subcutaneous Daily  feeding supplement  237 mL Oral TID BM   furosemide  40 mg Oral Daily   levothyroxine  50 mcg Oral Daily   mometasone-formoterol  2 puff Inhalation BID   pantoprazole  40 mg Oral Daily   polyethylene glycol  17 g Oral Daily   Continuous Infusions:  ceFEPime (MAXIPIME) IV 2 g (06/03/22 1727)   vancomycin 750 mg (06/03/22 0847)     LOS: 6 days    Time spent: over 30 min    Fayrene Helper, MD [facility]   To contact the attending provider between 7A-7P or the covering provider during after hours 7P-7A, please log into the web site [email or web address] and access using universal [place] password for that web site. If you do not have the password, please call the hospital operator.  06/03/2022, 6:20 PM

## 2022-06-05 DIAGNOSIS — L89890 Pressure ulcer of other site, unstageable: Secondary | ICD-10-CM | POA: Diagnosis not present

## 2022-06-05 LAB — CBC WITH DIFFERENTIAL/PLATELET
Abs Immature Granulocytes: 0.1 10*3/uL — ABNORMAL HIGH (ref 0.00–0.07)
Basophils Absolute: 0.1 10*3/uL (ref 0.0–0.1)
Basophils Relative: 1 %
Eosinophils Absolute: 0.6 10*3/uL — ABNORMAL HIGH (ref 0.0–0.5)
Eosinophils Relative: 5 %
HCT: 33.5 % — ABNORMAL LOW (ref 36.0–46.0)
Hemoglobin: 11 g/dL — ABNORMAL LOW (ref 12.0–15.0)
Immature Granulocytes: 1 %
Lymphocytes Relative: 17 %
Lymphs Abs: 1.8 10*3/uL (ref 0.7–4.0)
MCH: 30.5 pg (ref 26.0–34.0)
MCHC: 32.8 g/dL (ref 30.0–36.0)
MCV: 92.8 fL (ref 80.0–100.0)
Monocytes Absolute: 1.3 10*3/uL — ABNORMAL HIGH (ref 0.1–1.0)
Monocytes Relative: 12 %
Neutro Abs: 7.1 10*3/uL (ref 1.7–7.7)
Neutrophils Relative %: 64 %
Platelets: 359 10*3/uL (ref 150–400)
RBC: 3.61 MIL/uL — ABNORMAL LOW (ref 3.87–5.11)
RDW: 14.3 % (ref 11.5–15.5)
WBC: 11 10*3/uL — ABNORMAL HIGH (ref 4.0–10.5)
nRBC: 0 % (ref 0.0–0.2)

## 2022-06-05 LAB — MAGNESIUM: Magnesium: 1.9 mg/dL (ref 1.7–2.4)

## 2022-06-05 LAB — COMPREHENSIVE METABOLIC PANEL
ALT: 15 U/L (ref 0–44)
AST: 21 U/L (ref 15–41)
Albumin: 2 g/dL — ABNORMAL LOW (ref 3.5–5.0)
Alkaline Phosphatase: 82 U/L (ref 38–126)
Anion gap: 8 (ref 5–15)
BUN: 32 mg/dL — ABNORMAL HIGH (ref 8–23)
CO2: 32 mmol/L (ref 22–32)
Calcium: 8.8 mg/dL — ABNORMAL LOW (ref 8.9–10.3)
Chloride: 99 mmol/L (ref 98–111)
Creatinine, Ser: 0.86 mg/dL (ref 0.44–1.00)
GFR, Estimated: >60 mL/min (ref >60)
Glucose, Bld: 106 mg/dL — ABNORMAL HIGH (ref 70–99)
Potassium: 4 mmol/L (ref 3.5–5.1)
Sodium: 139 mmol/L (ref 135–145)
Total Bilirubin: 0.5 mg/dL (ref 0.3–1.2)
Total Protein: 6.5 g/dL (ref 6.5–8.1)

## 2022-06-05 LAB — PHOSPHORUS: Phosphorus: 2.5 mg/dL (ref 2.5–4.6)

## 2022-06-05 MED ORDER — AMOXICILLIN-POT CLAVULANATE 875-125 MG PO TABS: 1.0000 | ORAL_TABLET | Freq: Two times a day (BID) | ORAL | 0 refills | Status: DC

## 2022-06-05 MED ORDER — DOXYCYCLINE HYCLATE 100 MG PO TABS: 100.0000 mg | ORAL_TABLET | Freq: Two times a day (BID) | ORAL | 0 refills | Status: DC

## 2022-06-05 MED ORDER — OMEPRAZOLE 20 MG PO CPDR: 20.0000 mg | DELAYED_RELEASE_CAPSULE | Freq: Every morning | ORAL | Status: DC

## 2022-06-05 NOTE — Progress Notes (Signed)
Report given to [place] of [place] ,all questions were answered

## 2022-06-05 NOTE — Discharge Summary (Signed)
Physician Discharge Summary  [name] JXB:[id number] DOB: 12/09/1922 DOA: 05/28/2022  PCP: [name] Northern, MD  Admit date: 05/28/2022 Discharge date: 06/05/2022  Time spent: 40 minutes  Recommendations for Outpatient Follow-up:  Follow outpatient CBC/CMP  Follow up with wound clinic as scheduled - has appt April 10th at 8 am, please transport to this appointment Follow up with ID as scheduled Please have palliative care follow outpatient at [facility] Continue wound care as recommended below    Discharge Diagnoses:  Principal Problem:   Pressure injury of right leg, unstageable Active Problems:   Atrial fibrillation   Diastolic heart failure   Edema   Bedbound   GERD (gastroesophageal reflux disease)   Pyogenic inflammation of bone   PAD (peripheral artery disease)   Discharge Condition: stable  Diet recommendation: heart healthy  Filed Weights   05/30/22 0438 06/02/22 0530 06/03/22 0434  Weight: 87.4 kg 90.1 kg 86.1 kg    History of present illness:   [name] is [name] 89 y.o. female with medical history significant for peripheral arterial disease, hypertension, atrial fibrillation not on rate control or anticoagulation, being admitted to the hospital due to right lower extremity chronic ulceration.  Patient has known peripheral arterial disease, has been followed by vascular surgery.  She has [name] history of this chronic right heel ulcer, saw vascular surgery Dr. [name] [name] couple of weeks ago as an outpatient, he states that she is not [name] surgical candidate, especially due to being nonambulatory since she had [name] fracture last year of the right femur.  Transferred from [facility] to [facility] for evaluation by vascular surgery.   Vascular recommended an AKA, but this was declined.  MRI showed possible early osteo.  At this time, planning for abx and wound care.   See below for additional details  Hospital Course:  Assessment and Plan:  Nonhealing right leg  wound/ulcer Early Osteomyelitis  - progressive over past few weeks - appreciate vascular recommendations - R AKA due to extensive tissue loss and non ambulatory status.  She's refused this.  - MRI R foot questionable osseous exposure of calcaneal tuberosity, no definite cortical destruction or T1 marrow signal abnormality to confirm osteo.  Mild subcortical T2 hyperintensity within the posterior aspect of the calcaneal tuberosity maybe reactive or secondary to early osteo.   - sed rate significantly elevated, CRP elevated as well, will assume osteo - plan for wound care, appreciate wound care recs --- she'll need outpatient follow up with wound care center - appt scheduled for April 10th at 8 am - [name] should follow up with transportation to this appointment  - Augmentin/doxy x 6-8 weeks per ID, they'll continue to follow outpatient  - I would recommend palliative care continue to follow her outpatient.  Long discussion with patient/family/friend, this plan of care not curative, unlikely that wound is going to get better.  They express understanding, but [name] is adamantly against surgery.  An AKA is certainly not without significant risks for her.  For now, based on our discussion, planning wound care and suppressive abx with assistance of ID.   Paroxysmal atrial fibrillation (HCC) Will slow ventricular response Asymptomatic  she does not appear to be on anticoagulation as she is high risk especially with falls.    Chronic Diastolic heart failure (HCC)  -continue Lasix, no evidence of exacerbation.   Chronic Hypoxic Resp Failure - remains on 2 L   Bedbound Seen by PT OT.   GERD (gastroesophageal reflux disease)  -  continue p.o. PPI   Goals Of care Palliative care on board, recommend they follow outpatient    Hypothyroidism-continue levothyroxine    Procedures: none   Consultations: Vascular palliative  Discharge Exam: Vitals:   06/05/22 0430 06/05/22 0824   BP: 123/62 (!) 133/42  Pulse: (!) 56 (!) 58  Resp: 18 20  Temp: 97.7 F (36.5 C) (!) 97.5 F (36.4 C)  SpO2: 96% 90%   No complaints today  General: No acute distress. Cardiovascular: RRR Lungs: unlabored Neurological: Alert and oriented 3. Moves all extremities 4 with equal strength. Cranial nerves II through XII grossly intact. Extremities: RLE wound evaluated, similar to photos from yesterday, diffuse eschar over heel and separate area at lower leg.  Bruising to MTP noted.    Discharge Instructions   Discharge Instructions     Call MD for:  difficulty breathing, headache or visual disturbances   Complete by: As directed    Call MD for:  extreme fatigue   Complete by: As directed    Call MD for:  hives   Complete by: As directed    Call MD for:  persistant dizziness or light-headedness   Complete by: As directed    Call MD for:  persistant nausea and vomiting   Complete by: As directed    Call MD for:  redness, tenderness, or signs of infection (pain, swelling, redness, odor or green/yellow discharge around incision site)   Complete by: As directed    Call MD for:  severe uncontrolled pain   Complete by: As directed    Call MD for:  temperature >100.4   Complete by: As directed    Diet - low sodium heart healthy   Complete by: As directed    Discharge instructions   Complete by: As directed    You were seen for [name] right lower extremity wound.  Vascular surgery did not think the limb was salvageable and recommended an above the knee amputation, which you have declined at this time.  We'll proceed with wound care and antibiotics.  Based on your inflammatory markers, I suspect you probably have an underlying bone infection as well.  The goal of the antibiotics and wound care is not curative, but more palliative - to slow progression of the wound and to avoid systemic infection.  I'm recommending palliative care continue to follow you outpatient.    We've arranged an  appointment at the Wound Care center on April 10th at 8 AM.  You'll need to have them transport you from your facility for this important appointment so they can follow your wound.  Infectious disease will also follow up with you.    Return for new, recurrent, or worsening symptoms.  Please ask your PCP to request records from this hospitalization so they know what was done and what the next steps will be.   Discharge wound care:   Complete by: As directed    Cleanse wounds to right posterior leg and heel with NS and pat dry.  Aquacel (LAWSON # [id number]) to open wounds and cover with gauze.  Wrap with kerlix and tape. Change daily. May need to moisten with saline to remove.  Change daily.   Increase activity slowly   Complete by: As directed       Allergies as of 06/05/2022       Reactions   Morphine And Related Other (See Comments)   Other Reaction: mild Intolerance, Allergy   Sulfa Antibiotics    Other reaction(s): Other (See  Comments) Other Reaction: mild Intolerance, Allergy   Meperidine Hcl Nausea Only   Nsaids    Other reaction(s): Other (See Comments) Bleeding ulcer   Tolmetin Other (See Comments)   Other reaction(s): Other (See Comments) Bleeding ulcer        Medication List     STOP taking these medications    sodium hypochlorite 0.125 % Soln Commonly known as: DAKIN'S 1/4 STRENGTH       TAKE these medications    acetaminophen 325 MG tablet Commonly known as: TYLENOL Take 2 tablets (650 mg total) by mouth every 6 (six) hours as needed for moderate pain. What changed: when to take this   albuterol 108 (90 Base) MCG/ACT inhaler Commonly known as: VENTOLIN HFA Inhale 2 puffs into the lungs every 4 (four) hours as needed for wheezing or shortness of breath.   amoxicillin-clavulanate 875-125 MG tablet Commonly known as: AUGMENTIN Take 1 tablet by mouth every 12 (twelve) hours. Follow up with infectious disease for additional recommendations   Biofreeze 4 %  Gel Generic drug: Menthol (Topical Analgesic) Apply 1 Application topically at bedtime. To BLE   calcium-vitamin D 500-200 MG-UNIT tablet Commonly known as: OSCAL WITH D Take 1 tablet by mouth daily.   CENTRUM PO Take 1 tablet by mouth daily.   cyclobenzaprine 5 MG tablet Commonly known as: FLEXERIL Take 5 mg by mouth at bedtime.   doxycycline 100 MG tablet Commonly known as: VIBRA-TABS Take 1 tablet (100 mg total) by mouth every 12 (twelve) hours. Follow up with infectious disease for additional recommendations   Ensure Take 237 mLs by mouth 2 (two) times daily.   feeding supplement (PRO-STAT SUGAR FREE 64) Liqd Take 30 mLs by mouth daily.   FISH OIL PO Take 1 tablet by mouth daily.   furosemide 40 MG tablet Commonly known as: LASIX Take 40 mg by mouth daily.   Guaifenesin 200 MG/5ML Liqd Take 10 mLs by mouth every 4 (four) hours as needed (Cough).   ipratropium-albuterol 0.5-2.5 (3) MG/3ML Soln Commonly known as: DUONEB Take 3 mLs by nebulization every 6 (six) hours as needed (SHOB/Wheezing).   levothyroxine 50 MCG tablet Commonly known as: SYNTHROID Take 50 mcg by mouth at bedtime.   MiraLax 17 g packet Generic drug: polyethylene glycol Take 17 g by mouth daily.   omeprazole 20 MG capsule Commonly known as: PRILOSEC Take 1 capsule (20 mg total) by mouth every morning.   oxyCODONE 5 MG immediate release tablet Commonly known as: Oxy IR/ROXICODONE Take 1 tablet (5 mg total) by mouth every 4 (four) hours as needed for severe pain or moderate pain.   Posture Seat Misc Lift chair   potassium chloride 10 MEQ tablet Commonly known as: KLOR-CON M Take 10 mEq by mouth daily.   senna 8.6 MG Tabs tablet Commonly known as: SENOKOT Take 2 tablets by mouth at bedtime.   Symbicort 160-4.5 MCG/ACT inhaler Generic drug: budesonide-formoterol Inhale 2 puffs into the lungs 2 (two) times daily.   zinc oxide 20 % ointment Apply 1 Application topically daily. Left  upper posterior thigh               Discharge Care Instructions  (From admission, onward)           Start     Ordered   06/05/22 0000  Discharge wound care:       Comments: Cleanse wounds to right posterior leg and heel with NS and pat dry.  Aquacel (LAWSON # [id number]) to open  wounds and cover with gauze.  Wrap with kerlix and tape. Change daily. May need to moisten with saline to remove.  Change daily.   06/05/22 1055           Allergies  Allergen Reactions   Morphine And Related Other (See Comments)    Other Reaction: mild Intolerance, Allergy   Sulfa Antibiotics     Other reaction(s): Other (See Comments) Other Reaction: mild Intolerance, Allergy   Meperidine Hcl Nausea Only   Nsaids     Other reaction(s): Other (See Comments) Bleeding ulcer   Tolmetin Other (See Comments)    Other reaction(s): Other (See Comments) Bleeding ulcer    Follow-up Information     [name] Northern, MD Follow up.   Specialty: Internal Medicine Contact information: [address] [place] [address] [phone]          [facility]              Follow up.   Why: Please follow up on Wednesday April 10th at 8 am, you have Tremell Reimers scheduled appointment at this time Contact information: [address] [place] [address] [phone]        Victorino Sparrow, MD Follow up.   Specialty: Vascular Surgery Contact information: [address] [place] [address] [phone]         Judyann Munson, MD Follow up.   Specialty: Infectious Diseases Contact information: [address] [place] [address] [phone]                  The results of significant diagnostics from this hospitalization (including imaging, microbiology, ancillary and laboratory) are listed below for reference.    Significant Diagnostic Studies: MR FOOT RIGHT WO CONTRAST  Result Date: 06/02/2022 CLINICAL DATA:   Evaluate for osteomyelitis in the right hindfoot and ankle. EXAM: MRI OF THE RIGHT HINDFOOT WITHOUT CONTRAST TECHNIQUE: Multiplanar, multisequence MR imaging of the right ankle and hindfoot was performed. No intravenous contrast was administered. COMPARISON:  Foot and ankle radiographs 05/28/2022. FINDINGS: The foot could not be flexed due to bandaging. Despite efforts by the technologist and patient, mild motion artifact is present on today's exam and could not be eliminated. This reduces exam sensitivity and specificity. Bones/Joint/Cartilage The soft tissues appear thinned and irregular over the calcaneal tuberosity. There is questionable osseous exposure of the calcaneal tuberosity. No cortical destruction or definite T1 marrow signal abnormality is identified. On the T2 weighted images, there is subcortical T2 hyperintensity within the posterior aspect of the calcaneal tuberosity. No other significant osseous findings are seen within the hindfoot or midfoot. There are no significant ankle or subtalar joint effusions. Mild midfoot degenerative changes. Ligaments Intact Lisfranc ligament. The ankle tendons are suboptimally evaluated due to positioning, although appear intact. Muscles and Tendons The ankle tendons appear intact with normal signal. No tenosynovitis. Mild nonspecific muscular atrophy. Soft tissues As above, apparent soft tissue thinning and irregularity over the calcaneal tuberosity. No underlying focal fluid collection. Mild subcutaneous edema in the hindfoot. IMPRESSION: 1. Apparent soft tissue thinning and irregularity over the calcaneal tuberosity with questionable osseous exposure of the calcaneal tuberosity. No focal fluid collection. 2. No definite cortical destruction or T1 marrow signal abnormality to confirm osteomyelitis. Mild subcortical T2 hyperintensity within the posterior aspect of the calcaneal tuberosity may be reactive or secondary to early osteomyelitis. If there is osseous  exposure of the calcaneal tuberosity, early osteomyelitis suspected. 3.  The ankle tendons and ligaments appear intact. 4. Mild midfoot degenerative changes. Electronically Signed   By: Carey Bullocks M.D.   On: 06/02/2022 13:29   DG Foot Complete Right  Result Date: 05/28/2022 CLINICAL DATA:  Infection, pressure ulcers EXAM: RIGHT FOOT COMPLETE - 3+ VIEW; RIGHT ANKLE - COMPLETE 3+ VIEW COMPARISON:  None Available. FINDINGS: Fracture or dislocation of the right foot or ankle. Disuse osteopenia. Moderate right first metatarsophalangeal arthrosis with otherwise mild arthrosis about the foot and ankle. Diffuse soft tissue edema, particularly about the forefoot. Probable soft tissue wound of the posterior heel. Vascular calcinosis. IMPRESSION: 1. No fracture or dislocation of the right foot or ankle. Disuse osteopenia. 2. Diffuse soft tissue edema, particularly about the forefoot. Probable soft tissue wound of the posterior heel. No radiographic findings to suggest osteomyelitis. MRI is the most sensitive test for the detection of bone marrow edema and osteomyelitis if suspected. Electronically Signed   By: Jearld Lesch M.D.   On: 05/28/2022 14:33   DG Ankle Complete Right  Result Date: 05/28/2022 CLINICAL DATA:  Infection, pressure ulcers EXAM: RIGHT FOOT COMPLETE - 3+ VIEW; RIGHT ANKLE - COMPLETE 3+ VIEW COMPARISON:  None Available. FINDINGS: Fracture or dislocation of the right foot or ankle. Disuse osteopenia. Moderate right first metatarsophalangeal arthrosis with otherwise mild arthrosis about the foot and ankle. Diffuse soft tissue edema, particularly about the forefoot. Probable soft tissue wound of the posterior heel. Vascular calcinosis. IMPRESSION: 1. No fracture or dislocation of the right foot or ankle. Disuse osteopenia. 2. Diffuse soft tissue edema, particularly about the forefoot. Probable soft tissue wound of the posterior heel. No radiographic findings to suggest osteomyelitis. MRI is the most  sensitive test for the detection of bone marrow edema and osteomyelitis if suspected. Electronically Signed   By: Jearld Lesch M.D.   On: 05/28/2022 14:33    Microbiology: Recent Results (from the past 240 hour(s))  Culture, blood (Routine X 2) w Reflex to ID Panel     Status: None   Collection Time: 05/28/22  3:44 PM   Specimen: Left Antecubital; Blood  Result Value Ref Range Status   Specimen Description   Final    LEFT ANTECUBITAL BLOOD Performed at [facility], [address] [place], [place] [address]    Special Requests   Final    BOTTLES DRAWN AEROBIC AND ANAEROBIC Blood Culture adequate volume Performed at [facility], [address], [place], [place] [address]    Culture   Final    NO GROWTH 5 DAYS Performed at [facility], [address] [place], [place] [address]    Report Status 06/02/2022 FINAL  Final  Culture, blood (Routine X 2) w Reflex to ID Panel     Status: None   Collection Time: 05/28/22  3:44 PM   Specimen: BLOOD LEFT FOREARM  Result Value Ref Range Status   Specimen Description   Final    BLOOD LEFT FOREARM Performed at [facility], [address] [place], [place] [address]    Special Requests   Final    BOTTLES DRAWN AEROBIC AND ANAEROBIC Blood Culture adequate volume Performed at [facility], [address], [place], [place] [address]    Culture   Final    NO GROWTH 5 DAYS Performed at [facility], [address] [place], [place] [address]    Report Status 06/02/2022 FINAL  Final     Labs: Basic Metabolic Panel:  Recent Labs  Lab 05/30/22 0114 05/31/22 0608 06/02/22 0440 06/04/22 0530 06/05/22 0058  NA 137 137  --  140 139  K 3.5 3.5  --  3.3* 4.0  CL 98 99  --  99 99  CO2 28 28  --  30 32  GLUCOSE 108* 99  --  96 106*  BUN 20 21  --  30* 32*  CREATININE 0.80 0.67 0.71 0.77 0.86  CALCIUM 8.4* 8.5*  --  8.7* 8.8*  MG  --   --   --  1.9 1.9  PHOS 2.7   --   --  2.7 2.5   Liver Function Tests: Recent Labs  Lab 06/04/22 0530 06/05/22 0058  AST 21 21  ALT 15 15  ALKPHOS 75 82  BILITOT 0.6 0.5  PROT 6.2* 6.5  ALBUMIN 1.9* 2.0*   No results for input(s): "LIPASE", "AMYLASE" in the last 168 hours. No results for input(s): "AMMONIA" in the last 168 hours. CBC: Recent Labs  Lab 05/30/22 0114 05/31/22 0608 06/04/22 0530 06/05/22 0058  WBC 13.9* 11.6* 10.1 11.0*  NEUTROABS  --   --  6.6 7.1  HGB 11.4* 10.8* 11.1* 11.0*  HCT 33.8* 31.3* 32.7* 33.5*  MCV 92.1 90.5 91.3 92.8  PLT 350 303 341 359   Cardiac Enzymes: No results for input(s): "CKTOTAL", "CKMB", "CKMBINDEX", "TROPONINI" in the last 168 hours. BNP: BNP (last 3 results) No results for input(s): "BNP" in the last 8760 hours.  ProBNP (last 3 results) No results for input(s): "PROBNP" in the last 8760 hours.  CBG: No results for input(s): "GLUCAP" in the last 168 hours.     Signed:  Lacretia Nicksaldwell Powell MD.  [facility] 06/05/2022, 11:11 AM

## 2022-06-05 NOTE — TOC Transition Note (Signed)
Transition of Care [facility]) - CM/SW Discharge Note   Patient Details  Name: [name] MRN: [id number] Date of Birth: [birth year]  [facility] CM/SW Contact:  Eduard Roux, LCSW Phone Number: 06/05/2022, 11:53 AM   Clinical Narrative:     Patient will Discharge to: [name] Discharge Date: 06/05/2022 Family Notified: nephew Transport By: [facility] @ 1:00pm  Per MD patient is ready for discharge. RN, patient, and facility notified of discharge. Discharge Summary sent to facility. RN given number for [phone], Room 202. Ambulance transport requested for patient.   Clinical Social Worker signing off.  Antony Blackbird, MSW, LCSW Clinical Social Worker     Final next level of care: Skilled Nursing Facility Barriers to Discharge: Continued Medical Work up   Patient Goals and CMS Choice      Discharge Placement                [facility]) Patient to be transferred to facility by: [facility] Name of family member notified: nephew Patient and family notified of of transfer: 06/05/22  Discharge Plan and Services Additional resources added to the After Visit Summary for   In-house Referral: Clinical Social Work                                   Social Determinants of Health (SDOH) Interventions SDOH Screenings   Food Insecurity: No Food Insecurity (05/29/2022)  Housing: Low Risk  (05/29/2022)  Transportation Needs: No Transportation Needs (05/29/2022)  Utilities: Not At Risk (05/29/2022)  Tobacco Use: Low Risk  (05/28/2022)     Readmission Risk Interventions    08/11/2021   12:14 PM  Readmission Risk Prevention Plan  Transportation Screening Complete  PCP or Specialist Appt within 3-5 Days Complete  HRI or Home Care Consult Complete  Social Work Consult for Recovery Care Planning/Counseling Complete  Palliative Care Screening Not Applicable  Medication Review Oceanographer) Complete

## 2022-06-08 DIAGNOSIS — L89610 Pressure ulcer of right heel, unstageable: Secondary | ICD-10-CM | POA: Diagnosis not present

## 2022-06-08 DIAGNOSIS — L97311 Non-pressure chronic ulcer of right ankle limited to breakdown of skin: Secondary | ICD-10-CM | POA: Diagnosis not present

## 2022-06-08 DIAGNOSIS — L89896 Pressure-induced deep tissue damage of other site: Secondary | ICD-10-CM | POA: Diagnosis not present

## 2022-06-08 DIAGNOSIS — L97811 Non-pressure chronic ulcer of other part of right lower leg limited to breakdown of skin: Secondary | ICD-10-CM | POA: Diagnosis not present

## 2022-06-10 ENCOUNTER — Encounter (HOSPITAL_BASED_OUTPATIENT_CLINIC_OR_DEPARTMENT_OTHER): Payer: Medicare PPO | Attending: Physician Assistant | Admitting: Physician Assistant

## 2022-06-10 DIAGNOSIS — Z7401 Bed confinement status: Secondary | ICD-10-CM | POA: Diagnosis not present

## 2022-06-10 DIAGNOSIS — M6281 Muscle weakness (generalized): Secondary | ICD-10-CM | POA: Insufficient documentation

## 2022-06-10 DIAGNOSIS — I10 Essential (primary) hypertension: Secondary | ICD-10-CM | POA: Diagnosis not present

## 2022-06-10 DIAGNOSIS — I480 Paroxysmal atrial fibrillation: Secondary | ICD-10-CM | POA: Diagnosis not present

## 2022-06-10 DIAGNOSIS — J449 Chronic obstructive pulmonary disease, unspecified: Secondary | ICD-10-CM | POA: Insufficient documentation

## 2022-06-10 DIAGNOSIS — L89619 Pressure ulcer of right heel, unspecified stage: Secondary | ICD-10-CM | POA: Diagnosis not present

## 2022-06-10 DIAGNOSIS — Z8249 Family history of ischemic heart disease and other diseases of the circulatory system: Secondary | ICD-10-CM | POA: Insufficient documentation

## 2022-06-10 DIAGNOSIS — L97818 Non-pressure chronic ulcer of other part of right lower leg with other specified severity: Secondary | ICD-10-CM | POA: Diagnosis not present

## 2022-06-10 DIAGNOSIS — L89899 Pressure ulcer of other site, unspecified stage: Secondary | ICD-10-CM | POA: Diagnosis not present

## 2022-06-10 DIAGNOSIS — L89610 Pressure ulcer of right heel, unstageable: Secondary | ICD-10-CM | POA: Insufficient documentation

## 2022-06-10 NOTE — Progress Notes (Signed)
[name] RosenthalBALLINGER, [name] ([id number]) [id number] Page 1 of 8 Visit Report for 06/10/2022 Allergy List Details Patient Name: Date of Service: [name] BolusBA [name], [name]. 06/10/2022 8:00 A M Medical Record Number: [id number] Patient Account Number: [id number] Date of Birth/Sex: Treating RN: 06/06/1922 (89 y.o. Arta SilenceF) [name], [name] Primary Care Kc Sedlak: Eloisa NorthernAmin, Saad Other Clinician: Referring Shalana Jardin: Treating Vic Esco/Extender: Bess KindsStone III, Hoyt Amin, Saad Weeks in Treatment: 0 Allergies Active Allergies meperidine NSAIDS (Non-Steroidal Anti-Inflammatory Drug) Opioids - Morphine Analogues tolmetin Sulfa (Sulfonamide Antibiotics) Allergy Notes Electronic Signature(s) Signed: 06/10/2022 4:43:09 PM By: Redmond PullingPalmer, Carrie RN, BSN Entered By: Redmond PullingPalmer, Carrie on 06/10/2022 08:26:33 -------------------------------------------------------------------------------- Arrival Information Details Patient Name: Date of Service: [name] DockerBA [name], [name] BurtonEMILY [name]. 06/10/2022 8:00 A M Medical Record Number: [id number] Patient Account Number: [id number] Date of Birth/Sex: Treating RN: 06/18/1922 (89 y.o. Orville GovernF) Palmer, Carrie Primary Care Hurshell Dino: Eloisa NorthernAmin, Saad Other Clinician: Referring Harold Mattes: Treating Mcdaniel Ohms/Extender: Gaynelle ArabianStone III, Hoyt Amin, Saad Weeks in Treatment: 0 Visit Information Patient Arrived: Wheel Chair Arrival Time: 08:16 Accompanied By: caregiver Transfer Assistance: Michiel SitesHoyer Lift Patient Identification Verified: Yes Secondary Verification Process Completed: Yes Patient Requires Transmission-Based Precautions: No Patient Has Alerts: Yes Patient Alerts: ***Hoyer**** History Since Last Visit Added or deleted any medications: Yes Any new allergies or adverse reactions: No Had a fall or experienced change in activities of daily living that may affect risk of falls: Yes Signs or symptoms of abuse/neglect since last visito No Hospitalized since last visit: Yes Implantable device outside of the clinic  excluding cellular tissue based products placed in the center since last visit: No Has Dressing in Place as Prescribed: Yes Pain Present Now: No Electronic Signature(s) Signed: 06/10/2022 4:43:09 PM By: Redmond PullingPalmer, Carrie RN, BSN Entered By: Redmond PullingPalmer, Carrie on 06/10/2022 08:23:02 -------------------------------------------------------------------------------- Clinic Level of Care Assessment Details Patient Name: Date of Service: [name] DockerBA [name], [name] BurtonEMILY [name]. 06/10/2022 8:00 A M Medical Record Number: [id number] Patient Account Number: [id number] Date of Birth/Sex: Treating RN: 12/30/1922 (89 y.o. Orville GovernF) Palmer, Carrie Primary Care Jakyren Fluegge: Eloisa NorthernAmin, Saad Other Clinician: Ileene MusaBALLINGER, [name] ([id number]) [id number] Page 2 of 8 Referring Cletus Mehlhoff: Treating Tory Septer/Extender: Gaynelle ArabianStone III, Hoyt Amin, Saad Weeks in Treatment: 0 Clinic Level of Care Assessment Items TOOL 4 Quantity Score X- 1 0 Use when only an EandM is performed on FOLLOW-UP visit ASSESSMENTS - Nursing Assessment / Reassessment X- 1 10 Reassessment of Co-morbidities (includes updates in patient status) X- 1 5 Reassessment of Adherence to Treatment Plan ASSESSMENTS - Wound and Skin A ssessment / Reassessment []  - 0 Simple Wound Assessment / Reassessment - one wound X- 2 5 Complex Wound Assessment / Reassessment - multiple wounds []  - 0 Dermatologic / Skin Assessment (not related to wound area) ASSESSMENTS - Focused Assessment X- 1 5 Circumferential Edema Measurements - multi extremities []  - 0 Nutritional Assessment / Counseling / Intervention []  - 0 Lower Extremity Assessment (monofilament, tuning fork, pulses) []  - 0 Peripheral Arterial Disease Assessment (using hand held doppler) ASSESSMENTS - Ostomy and/or Continence Assessment and Care []  - 0 Incontinence Assessment and Management []  - 0 Ostomy Care Assessment and Management (repouching, etc.) PROCESS - Coordination of Care []  - 0 Simple Patient /  Family Education for ongoing care X- 1 20 Complex (extensive) Patient / Family Education for ongoing care X- 1 10 Staff obtains ChiropractorConsents, Records, T Results / Process Orders est X- 1 10 Staff telephones HHA, Nursing Homes / Clarify orders / etc []  - 0 Routine Transfer to another Facility (non-emergent condition) []  - 0 Routine Hospital Admission (non-emergent condition) X- 1 15 New  Admissions / Manufacturing engineer / Ordering NPWT Apligraf, etc. ,  - 0 Emergency Hospital Admission (emergent condition)  - 0 Simple Discharge Coordination  - 0 Complex (extensive) Discharge Coordination PROCESS - Special Needs  - 0 Pediatric / Minor Patient Management  - 0 Isolation Patient Management  - 0 Hearing / Language / Visual special needs  - 0 Assessment of Community assistance (transportation, D/C planning, etc.)  - 0 Additional assistance / Altered mentation  - 0 Support Surface(s) Assessment (bed, cushion, seat, etc.) INTERVENTIONS - Wound Cleansing / Measurement  - 0 Simple Wound Cleansing - one wound X- 2 5 Complex Wound Cleansing - multiple wounds X- 1 5 Wound Imaging (photographs - any number of wounds)  - 0 Wound Tracing (instead of photographs)  - 0 Simple Wound Measurement - one wound X- 2 5 Complex Wound Measurement - multiple wounds INTERVENTIONS - Wound Dressings AIMEE, HELDMAN [name] ([id number]) [id number] Page 3 of 8  - 0 Small Wound Dressing one or multiple wounds X- 1 15 Medium Wound Dressing one or multiple wounds  - 0 Large Wound Dressing one or multiple wounds  - 0 Application of Medications - topical  - 0 Application of Medications - injection INTERVENTIONS - Miscellaneous  - 0 External ear exam  - 0 Specimen Collection (cultures, biopsies, blood, body fluids, etc.)  - 0 Specimen(s) / Culture(s) sent or taken to Lab for analysis  - 0 Patient Transfer (multiple staff / Water quality scientist / Similar devices)  - 0 Simple Staple / Suture removal (25 or less)  - 0 Complex Staple / Suture removal (26 or more)  - 0 Hypo / Hyperglycemic Management (close monitor of Blood Glucose)  - 0 Ankle / Brachial Index (ABI) - do not check if billed separately X- 1 5 Vital Signs Has the patient been seen at the hospital within the last three years: Yes Total Score: 130 Level Of Care: New/Established - Level 4 Electronic Signature(s) Signed: 06/10/2022 4:43:09 PM By: Redmond Pulling RN, BSN Entered By: Redmond Pulling on 06/10/2022 10:49:27 -------------------------------------------------------------------------------- Encounter Discharge Information Details Patient Name: Date of Service: [name], [name] [name]. 06/10/2022 8:00 A M Medical Record Number: [id number] Patient Account Number: [id number] Date of Birth/Sex: Treating RN: 1923-02-05 (89 y.o. Orville Govern Primary Care Jeson Camacho: Eloisa Northern Other Clinician: Referring Porter Moes: Treating Jennell Janosik/Extender: Gaynelle Arabian in Treatment: 0 Encounter Discharge Information Items Discharge Condition: Stable Ambulatory Status: Wheelchair Discharge Destination: Home Transportation: Private Auto Accompanied By: friend Schedule Follow-up Appointment: Yes Clinical Summary of Care: Patient Declined Electronic Signature(s) Signed: 06/10/2022 4:43:09 PM By: Redmond Pulling RN, BSN Entered By: Redmond Pulling on 06/10/2022 12:27:57 -------------------------------------------------------------------------------- Lower Extremity Assessment Details Patient Name: Date of Service: [name], [name] [name]. 06/10/2022 8:00 A M Medical Record Number: [id number] Patient Account Number: [id number] Date of Birth/Sex: Treating RN: 16-Jun-1922 (89 y.o. Orville Govern Primary Care Cordney Barstow: Eloisa Northern Other Clinician: Referring Chauntay Paszkiewicz: Treating Terril Chestnut/Extender: Daine Gip, [name] Weeks in Treatment: 0 Edema  Assessment Assessed: [Left: No] [Right: Yes] B[LeftYERLIN, GASPARYAN ([id number])] [Right: [id number] Page 4 of 8] [Left: Edema] [Right: :] Calf Left: Right: Point of Measurement: From Medial Instep 40 cm Ankle Left: Right: Point of Measurement: From Medial Instep 24.5 cm Vascular Assessment Pulses: Dorsalis Pedis Palpable: [Right:Yes] Electronic Signature(s) Signed: 06/10/2022 4:43:09 PM By: Redmond Pulling RN, BSN Entered By: Redmond Pulling on 06/10/2022 08:48:50 -------------------------------------------------------------------------------- Multi-Disciplinary Care Plan Details Patient Name: Date of Service: [name], Xin [name]. 06/10/2022 8:00 A M  Medical Record Number: [id number] Patient Account Number: [id number] Date of Birth/Sex: Treating RN: [birth year] (89 y.o. Orville Govern Primary Care Abigial Newville: Eloisa Northern Other Clinician: Referring Salam Chesterfield: Treating Jesyca Weisenburger/Extender: Gaynelle Arabian in Treatment: 0 Active Inactive Pressure Nursing Diagnoses: Knowledge deficit related to causes and risk factors for pressure ulcer development Knowledge deficit related to management of pressures ulcers Potential for impaired tissue integrity related to pressure, friction, moisture, and shear Goals: Patient will remain free from development of additional pressure ulcers Date Initiated: 06/10/2022 Target Resolution Date: 07/14/2022 Goal Status: Active Patient will remain free of pressure ulcers Date Initiated: 06/10/2022 Target Resolution Date: 07/29/2022 Goal Status: Active Patient/caregiver will verbalize risk factors for pressure ulcer development Date Initiated: 06/10/2022 Target Resolution Date: 07/23/2022 Goal Status: Active Patient/caregiver will verbalize understanding of pressure ulcer management Date Initiated: 06/10/2022 Target Resolution Date: 07/14/2022 Goal Status: Active Interventions: Assess: immobility, friction,  shearing, incontinence upon admission and as needed Assess offloading mechanisms upon admission and as needed Assess potential for pressure ulcer upon admission and as needed Provide education on pressure ulcers Notes: Wound/Skin Impairment Nursing Diagnoses: Impaired tissue integrity Knowledge deficit related to ulceration/compromised skin integrity Goals: Patient/caregiver will verbalize understanding of skin care regimen MADELEY, ZAPALAC [name] ([id number]) [id number] Page 5 of 8 Date Initiated: 06/10/2022 Target Resolution Date: 07/31/2022 Goal Status: Active Ulcer/skin breakdown will have a volume reduction of 30% by week 4 Date Initiated: 06/10/2022 Target Resolution Date: 07/14/2022 Goal Status: Active Interventions: Assess patient/caregiver ability to obtain necessary supplies Assess patient/caregiver ability to perform ulcer/skin care regimen upon admission and as needed Assess ulceration(s) every visit Provide education on ulcer and skin care Notes: Electronic Signature(s) Signed: 06/10/2022 4:43:09 PM By: Redmond Pulling RN, BSN Entered By: Redmond Pulling on 06/10/2022 09:15:36 -------------------------------------------------------------------------------- Pain Assessment Details Patient Name: Date of Service: [name], [name] [name]. 06/10/2022 8:00 A M Medical Record Number: [id number] Patient Account Number: [id number] Date of Birth/Sex: Treating RN: [birth year] (89 y.o. Orville Govern Primary Care Saafir Abdullah: Eloisa Northern Other Clinician: Referring Shanya Ferriss: Treating Alease Fait/Extender: Bess Kinds Weeks in Treatment: 0 Active Problems Location of Pain Severity and Description of Pain Patient Has Paino No Site Locations Pain Management and Medication Current Pain Management: Electronic Signature(s) Signed: 06/10/2022 4:43:09 PM By: Redmond Pulling RN, BSN Entered By: Redmond Pulling on 06/10/2022  08:31:44 -------------------------------------------------------------------------------- Patient/Caregiver Education Details Patient Name: Date of Service: BA [name], [name]. 4/10/2024andnbsp8:00 A M Medical Record Number: [id number] Patient Account Number: [id number] Date of Birth/Gender: Treating RN: 06-Aug-1922 (89 y.o. Orville Govern Primary Care Physician: Eloisa Northern Other Clinician: Referring Physician: Treating Physician/Extender: Daine Gip, Criss Rosales in Treatment: 0 [place], [place] [name] ([id number]) [id number] Page 6 of 8 Education Assessment Education Provided To: Patient Education Topics Provided Pressure: Methods: Explain/Verbal Responses: State content correctly Wound/Skin Impairment: Methods: Explain/Verbal Responses: State content correctly Electronic Signature(s) Signed: 06/10/2022 4:43:09 PM By: Redmond Pulling RN, BSN Entered By: Redmond Pulling on 06/10/2022 09:16:03 -------------------------------------------------------------------------------- Wound Assessment Details Patient Name: Date of Service: [name]. 06/10/2022 8:00 A M Medical Record Number: [id number] Patient Account Number: [id number] Date of Birth/Sex: Treating RN: Apr 01, 1922 (89 y.o. Orville Govern Primary Care Alphons Burgert: Eloisa Northern Other Clinician: Referring Corban Kistler: Treating Glendy Barsanti/Extender: Bess Kinds Weeks in Treatment: 0 Wound Status Wound Number: 2 Primary Pressure Ulcer Etiology: Wound Location: Right, Posterior Lower Leg Wound Open Wounding Event: Pressure Injury Status: Date Acquired: 05/27/2022 Comorbid Cataracts, Chronic Obstructive Pulmonary Disease (COPD), Weeks Of Treatment: 0 History: Arrhythmia,  Hypertension, Osteoarthritis, Received Radiation Clustered Wound: No Wound Measurements Length: (cm) 6.8 Width: (cm) 5.4 Depth: (cm) 0.1 Area: (cm) 28.84 Volume: (cm) 2.884 % Reduction in Area: %  Reduction in Volume: Epithelialization: None Tunneling: No Undermining: No Wound Description Classification: Unstageable/Unclassified Exudate Amount: Medium Exudate Type: Serosanguineous Exudate Color: red, brown Foul Odor After Cleansing: No Slough/Fibrino Yes Wound Bed Granulation Amount: None Present (0%) Necrotic Amount: Large (67-100%) Necrotic Quality: Eschar, Adherent Slough Periwound Skin Texture Texture Color No Abnormalities Noted: No No Abnormalities Noted: No Moisture No Abnormalities Noted: No Treatment Notes Wound #2 (Lower Leg) Wound Laterality: Right, Posterior Cleanser Peri-Wound Care ROMETTA, COTRELL [name] ([id number]) [id number] Page 7 of 8 Topical Betadine Primary Dressing Secondary Dressing ABD Pad, 8x10 Discharge Instruction: Apply over primary dressing as directed. Secured With [employer], 4.5x3.1 (in/yd) Discharge Instruction: Secure with Kerlix as directed. 44M Medipore H Soft Cloth Surgical T ape, 4 x 10 (in/yd) Discharge Instruction: Secure with tape as directed. Compression Wrap Compression Stockings Add-Ons Electronic Signature(s) Signed: 06/10/2022 4:43:09 PM By: Redmond Pulling RN, BSN Entered By: Redmond Pulling on 06/10/2022 08:54:39 -------------------------------------------------------------------------------- Wound Assessment Details Patient Name: Date of Service: [name], [name] [name]. 06/10/2022 8:00 A M Medical Record Number: [id number] Patient Account Number: [id number] Date of Birth/Sex: Treating RN: 09/15/1922 (89 y.o. Orville Govern Primary Care Trayvond Viets: Eloisa Northern Other Clinician: Referring Rakisha Pincock: Treating Brehanna Deveny/Extender: Bess Kinds Weeks in Treatment: 0 Wound Status Wound Number: 3 Primary Pressure Ulcer Etiology: Wound Location: Right Calcaneus Wound Open Wounding Event: Pressure Injury Status: Date Acquired: 05/27/2022 Comorbid Cataracts, Chronic Obstructive  Pulmonary Disease (COPD), Weeks Of Treatment: 0 History: Arrhythmia, Hypertension, Osteoarthritis, Received Radiation Clustered Wound: No Wound Measurements Length: (cm) 10 Width: (cm) 7.5 Depth: (cm) 0.1 Area: (cm) 58.905 Volume: (cm) 5.89 % Reduction in Area: % Reduction in Volume: Epithelialization: None Tunneling: No Undermining: No Wound Description Classification: Unstageable/Unclassified Exudate Amount: Medium Exudate Type: Serosanguineous Exudate Color: red, brown Foul Odor After Cleansing: No Slough/Fibrino Yes Wound Bed Granulation Amount: None Present (0%) Necrotic Amount: Large (67-100%) Necrotic Quality: Eschar, Adherent Slough Periwound Skin Texture Texture Color No Abnormalities Noted: No No Abnormalities Noted: No Moisture No Abnormalities Noted: No Treatment Notes Wound #3 (Calcaneus) Wound Laterality: Right JALESHIA, AWADA [name] ([id number]) [id number] Page 8 of 8 Cleanser Peri-Wound Care Topical Betadine Primary Dressing Secondary Dressing ABD Pad, 8x10 Discharge Instruction: Apply over primary dressing as directed. Secured With [employer], 4.5x3.1 (in/yd) Discharge Instruction: Secure with Kerlix as directed. 44M Medipore H Soft Cloth Surgical T ape, 4 x 10 (in/yd) Discharge Instruction: Secure with tape as directed. Compression Wrap Compression Stockings Add-Ons Electronic Signature(s) Signed: 06/10/2022 4:43:09 PM By: Redmond Pulling RN, BSN Entered By: Redmond Pulling on 06/10/2022 08:52:07 -------------------------------------------------------------------------------- Vitals Details Patient Name: Date of Service: [name], [name] [name]. 06/10/2022 8:00 A M Medical Record Number: [id number] Patient Account Number: [id number] Date of Birth/Sex: Treating RN: Feb 15, 1923 (89 y.o. Orville Govern Primary Care Karli Wickizer: Eloisa Northern Other Clinician: Referring Deshonda Cryderman: Treating Kendarius Vigen/Extender: Bess Kinds Weeks in Treatment: 0 Vital Signs Time Taken: 08:20 Pulse (bpm): 59 Respiratory Rate (breaths/min): 18 Blood Pressure (mmHg): 131/72 Reference Range: 80 - 120 mg / dl Electronic Signature(s) Signed: 06/10/2022 4:43:09 PM By: Redmond Pulling RN, BSN Entered By: Redmond Pulling on 06/10/2022 08:25:03

## 2022-06-10 NOTE — Progress Notes (Signed)
JEANENE, MORICI [name] ([id number]) [id number] Page 1 of 4 Visit Report for 06/10/2022 Abuse Risk Screen Details Patient Name: Date of Service: [name], [name] 06/10/2022 8:00 A M Medical Record Number: [id number] Patient Account Number: [id number] Date of Birth/Sex: Treating RN: 1922-04-07 (89 y.o. Orville Govern Primary Care Katelan Hirt: Eloisa Northern Other Clinician: Referring Khyler Eschmann: Treating Aisa Schoeppner/Extender: Gaynelle Arabian in Treatment: 0 Abuse Risk Screen Items Answer ABUSE RISK SCREEN: Has anyone close to you tried to hurt or harm you recentlyo No Do you feel uncomfortable with anyone in your familyo No Has anyone forced you do things that you didnt want to doo No Electronic Signature(s) Signed: 06/10/2022 4:43:09 PM By: Redmond Pulling RN, BSN Entered By: Redmond Pulling on 06/10/2022 08:29:13 -------------------------------------------------------------------------------- Activities of Daily Living Details Patient Name: Date of Service: [name], [name] 06/10/2022 8:00 A M Medical Record Number: [id number] Patient Account Number: [id number] Date of Birth/Sex: Treating RN: Mar 31, 1922 (89 y.o. Orville Govern Primary Care Trace Cederberg: Eloisa Northern Other Clinician: Referring Yashica Sterbenz: Treating Moriya Mitchell/Extender: Gaynelle Arabian in Treatment: 0 Activities of Daily Living Items Answer Activities of Daily Living (Please select one for each item) Drive Automobile Not Able T Medications ake Not Able Use T elephone Not Able Care for Appearance Not Able Use T oilet Not Able Mady Haagensen / Shower Not Able Dress Self Not Able Feed Self Need Assistance Walk Not Able Get In / Out Bed Not Able Housework Not Able Prepare Meals Not Able Handle Money Need Assistance Shop for Self Not Able Electronic Signature(s) Signed: 06/10/2022 4:43:09 PM By: Redmond Pulling RN, BSN Entered By: Redmond Pulling on 06/10/2022  08:29:47 -------------------------------------------------------------------------------- Education Screening Details Patient Name: Date of Service: [name], [name] Burton [name]. 06/10/2022 8:00 A M Medical Record Number: [id number] Patient Account Number: [id number] Date of Birth/Sex: Treating RN: 29-Jul-1922 (89 y.o. Orville Govern Primary Care Shakenna Herrero: Eloisa Northern Other Clinician: Referring Emelyn Roen: Treating Denina Rieger/Extender: Daine Gip, Criss Rosales in Treatment: 0 [place], [place] [name] ([id number]) [id number] Page 2 of 4 Learning Preferences/Education Level/Primary Language Learning Preference: Explanation, Demonstration, Printed Material Preferred Language: English Cognitive Barrier Language Barrier: No Translator Needed: No Memory Deficit: No Emotional Barrier: No Cultural/Religious Beliefs Affecting Medical Care: No Physical Barrier Impaired Vision: No Impaired Hearing: No Decreased Hand dexterity: No Knowledge/Comprehension Knowledge Level: High Comprehension Level: High Ability to understand written instructions: High Ability to understand verbal instructions: High Motivation Anxiety Level: Calm Cooperation: Cooperative Education Importance: Acknowledges Need Interest in Health Problems: Asks Questions Perception: Coherent Willingness to Engage in Self-Management High Activities: Readiness to Engage in Self-Management High Activities: Electronic Signature(s) Signed: 06/10/2022 4:43:09 PM By: Redmond Pulling RN, BSN Entered By: Redmond Pulling on 06/10/2022 08:30:20 -------------------------------------------------------------------------------- Fall Risk Assessment Details Patient Name: Date of Service: [name], [name] Burton [name]. 06/10/2022 8:00 A M Medical Record Number: [id number] Patient Account Number: [id number] Date of Birth/Sex: Treating RN: 04-09-1922 (89 y.o. Orville Govern Primary Care Greer Wainright: Eloisa Northern Other  Clinician: Referring Kaye Luoma: Treating Nickayla Mcinnis/Extender: Gaynelle Arabian in Treatment: 0 Fall Risk Assessment Items Have you had 2 or more falls in the last 12 monthso 0 Yes Have you had any fall that resulted in injury in the last 12 monthso 0 Yes FALLS RISK SCREEN History of falling - immediate or within 3 months 25 Yes Secondary diagnosis (Do you have 2 or more medical diagnoseso) 15 Yes Ambulatory aid None/bed rest/wheelchair/nurse 0 Yes Crutches/cane/walker 0 No Furniture 0 No Intravenous therapy  Access/Saline/Heparin Lock 0 No Gait/Transferring Normal/ bed rest/ wheelchair 0 Yes Weak (short steps with or without shuffle, stooped but able to lift head while walking, may seek 0 No support from furniture) Impaired (short steps with shuffle, may have difficulty arising from chair, head down, impaired 0 No balance) Mental Status Oriented to own ability 0 Yes Electronic Signature(sMARCHELLA, [name] ([id number]) [phone].pdf Page 3 of 4 Signed: 06/10/2022 4:43:09 PM By: Redmond Pulling RN, BSN Entered By: Redmond Pulling on 06/10/2022 08:30:40 -------------------------------------------------------------------------------- Foot Assessment Details Patient Name: Date of Service: [name], [name] Burton [name]. 06/10/2022 8:00 A M Medical Record Number: [id number] Patient Account Number: [id number] Date of Birth/Sex: Treating RN: [birth year] (89 y.o. Orville Govern Primary Care Taijuan Serviss: Eloisa Northern Other Clinician: Referring Tyshay Adee: Treating Trine Fread/Extender: Gaynelle Arabian in Treatment: 0 Foot Assessment Items Site Locations + = Sensation present, - = Sensation absent, C = Callus, U = Ulcer [name] = Redness, W = Warmth, M = Maceration, PU = Pre-ulcerative lesion F = Fissure, S = Swelling, D = Dryness Assessment Right: Left: Other Deformity: No No Prior Foot Ulcer: No No Prior Amputation: No No Charcot Joint: No  No Ambulatory Status: Gait: Electronic Signature(s) Signed: 06/10/2022 4:43:09 PM By: Redmond Pulling RN, BSN Entered By: Redmond Pulling on 06/10/2022 08:47:14 -------------------------------------------------------------------------------- Nutrition Risk Screening Details Patient Name: Date of Service: [name] Forth [name]. 06/10/2022 8:00 A M Medical Record Number: [id number] Patient Account Number: [id number] Date of Birth/Sex: Treating RN: 1922-08-25 (89 y.o. Orville Govern Primary Care Ivey Cina: Eloisa Northern Other Clinician: Referring Quiera Diffee: Treating Tyese Finken/Extender: Bess Kinds Weeks in Treatment: 0 Height (in): Weight (lbs): Body Mass Index (BMI): [name], [name] ([id number]) [phone] [id number] Page 4 of 4 Nutrition Risk Screening Items Score Screening NUTRITION RISK SCREEN: I have an illness or condition that made me change the kind and/or amount of food I eat 2 Yes I eat fewer than two meals per day 0 No I eat few fruits and vegetables, or milk products 0 No I have three or more drinks of beer, liquor or wine almost every day 0 No I have tooth or mouth problems that make it hard for me to eat 0 No I don't always have enough money to buy the food I need 0 No I eat alone most of the time 0 No I take three or more different prescribed or over-the-counter drugs a day 1 Yes Without wanting to, I have lost or gained 10 pounds in the last six months 0 No I am not always physically able to shop, cook and/or feed myself 0 No Nutrition Protocols Good Risk Protocol Moderate Risk Protocol High Risk Proctocol Risk Level: Moderate Risk Score: 3 Electronic Signature(s) Signed: 06/10/2022 4:43:09 PM By: Redmond Pulling RN, BSN Entered By: Redmond Pulling on 06/10/2022 08:31:30

## 2022-06-11 DIAGNOSIS — L89896 Pressure-induced deep tissue damage of other site: Secondary | ICD-10-CM | POA: Diagnosis not present

## 2022-06-11 DIAGNOSIS — M869 Osteomyelitis, unspecified: Secondary | ICD-10-CM | POA: Diagnosis not present

## 2022-06-11 DIAGNOSIS — L97811 Non-pressure chronic ulcer of other part of right lower leg limited to breakdown of skin: Secondary | ICD-10-CM | POA: Diagnosis not present

## 2022-06-11 DIAGNOSIS — I739 Peripheral vascular disease, unspecified: Secondary | ICD-10-CM | POA: Diagnosis not present

## 2022-06-11 DIAGNOSIS — I480 Paroxysmal atrial fibrillation: Secondary | ICD-10-CM | POA: Diagnosis not present

## 2022-06-11 DIAGNOSIS — L97311 Non-pressure chronic ulcer of right ankle limited to breakdown of skin: Secondary | ICD-10-CM | POA: Diagnosis not present

## 2022-06-11 DIAGNOSIS — L89610 Pressure ulcer of right heel, unstageable: Secondary | ICD-10-CM | POA: Diagnosis not present

## 2022-06-11 DIAGNOSIS — I10 Essential (primary) hypertension: Secondary | ICD-10-CM | POA: Diagnosis not present

## 2022-06-11 NOTE — Progress Notes (Signed)
[name], AMSLER [name] ([id number]) [id number] Page 1 of 9 Visit Report for 06/10/2022 Chief Complaint Document Details Patient Name: Date of Service: [name], [name]. 06/10/2022 8:00 A M Medical Record Number: [id number] Patient Account Number: [id number] Date of Birth/Sex: Treating RN: January 06, 1923 (89 y.o. F) Primary Care Provider: Eloisa Northern Other Clinician: Referring Provider: Treating Provider/Extender: Gaynelle Arabian in Treatment: 0 Information Obtained from: Patient Chief Complaint Right LE Ulcers Electronic Signature(s) Signed: 06/10/2022 9:13:42 AM By: Allen Derry PA-C Entered By: Allen Derry on 06/10/2022 09:13:42 -------------------------------------------------------------------------------- HPI Details Patient Name: Date of Service: [name], [name] Burton [name]. 06/10/2022 8:00 A M Medical Record Number: [id number] Patient Account Number: [id number] Date of Birth/Sex: Treating RN: 1922-06-14 (89 y.o. F) Primary Care Provider: Eloisa Northern Other Clinician: Referring Provider: Treating Provider/Extender: Gaynelle Arabian in Treatment: 0 History of Present Illness HPI Description: 01/08/2021 patient presents today for evaluation of a pressure ulcer which began after she fell in July and broke her arm. This unfortunately has led to her being overall more generally weak and she is not really up and moving around much at all. While she was in the hospital she developed the heel ulcers bilaterally the right was more deep tissue injury that pretty much appears to be healed at this point. At the current skilled nursing facility they have actually been doing a great job taking care of her according to her friend who also helps take care of her. Subsequently the patient's wound on the left heel is eschar covered and probably would benefit from clearing away this eschar so that she can actually see some improvements hopefully. Currently  they have been utilizing Iodosorb along with a border foam dressing. Patient has a history of hypertension, COPD, generalized muscle weakness, and atrial fibrillation. Due to her age they elected not to place her on any blood thinners for the atrial fibrillation. 01/15/2021 upon evaluation today patient appears to be doing better in regard to the heel ulcer. I do not have initially upon evaluation today her arterial studies that were done at the facility. Therefore I did not perform any sharp debridement while she was present during the office visit today. Nonetheless I am going to suggest based on what was seen currently that we probably once we get this with you at everything appears to be okay consider stop debridement at the next visit. 01/29/2021 upon evaluation today patient appears to be doing well with regard to her wound all things considered her blood flow is definitely not very good based on the review of the arterial study although it was really incomplete in my opinion there was not even a brachial reading to compare to but nonetheless the readings in the extremities were very poor. In the end I did actually discuss with her going ahead and going to vascular and they have already get that appointment set up which is great news. Fortunately there does not appear to be signs of infection currently. The wound is really not making tremendous progress but again I would not expect it to do so with where things stand currently until we can get this moving a little bit better direction. 02/19/2021 upon evaluation today patient's wound actually showing signs of doing about the same the still very thick region of eschar noted currently. I do think that based on the fact that there really is nothing from a vascular standpoint recommend currently which I completely understand that this is still something that is going  to require as aggressive as we can wound care obviously I am not going to perform  any debridement but nonetheless I do think that good wound care including Santyl can be beneficial for the patient currently. 03/12/2021 upon evaluation today patient appears to be doing well with regard to her wound. Fortunately there does not appear to be any signs of active infection at this time which is great news. No fevers, chills, nausea, vomiting, or diarrhea. I do feel like the Melburn PopperSantyl is doing a good job although we need to make sure that they are put in a saline moistened gauze and behind to make sure this stays nice and moist it seems to be a little bit dry. 04/02/2021 upon evaluation today patient's wound is actually showing signs of good improvement. Fortunately there does not appear to be any evidence of active infection locally nor systemically at this time which is great news. No fevers, chills, nausea, vomiting, or diarrhea. 04/23/2021 upon evaluation today patient's wound is actually showing signs of being a little bit smaller which is good news and I think that we are headed in the right direction. Obviously this is something that is going to continue to slowly progress I believe and again as long as we keep it clean and keep it from getting infected that is going to be the big thing. 05/14/2021: Today, the wound has contracted even more. There continues to be loose but somewhat thick yellow slough in the wound base. The wound is moist. 06-04-2021 upon evaluation today patient appears to be doing well currently in regard to her heel ulcer. I am actually extremely pleased with where we stand and I think the patient is making good progress here. There does not appear to be any signs of active infection locally or systemically which is great news. [name], [name] ([id number]) [id number] Page 2 of 9 06-25-2021 upon evaluation today patient's wound is actually showing signs of excellent improvement. I am actually very pleased with where we stand and I think the  patient is making excellent progress. There does not appear to be any signs of active infection at this time. No fevers, chills, nausea, vomiting, or diarrhea. 07-23-2021 upon evaluation today patient's wound actually appears to be doing quite well. Fortunately there does not appear to be any evidence of active infection locally nor systemically which is great news. No fevers, chills, nausea, vomiting, or diarrhea. Readmission: 06-10-2022 upon evaluation today patient presents for initial inspection here in our clinic concerning issues that she has been having with wounds over the right lower extremity. This is part of the leg as well as the ankle region unfortunately there is significant necrosis noted in the way of eschar. Actually did perform a fairly extensive record review through epic today. With that being said unfortunately the patient continues to have a lot of issues here with her arterial flow I did review Dr. Sherral Hammersobbins note and there does not appear to be any simple way to fix this blood flow. In fact he states that she is not a candidate for revascularization at this point she is looking at basically more palliative care versus a above-knee amputation of this extremity. Again this is an unfortunate situation of being but again at 89 years old I discussed with the patient that I am not sure I would recommend going the extensive route of a amputation surgery especially in light of the fact that she never know if that is going to turn out to be as  good is everybody is hoping as it stands anyway. She voiced understanding. With that being said for now she is really not super interested in going with amputation but nonetheless is a little unsure of what exactly to do. She does I think have a very low chance of getting this to actually heal completely. 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 Electronic Signature(s) Signed: 06/10/2022 5:42:19 PM By: Allen Derry PA-C Entered By: Allen Derry on 06/10/2022 17:42:19 -------------------------------------------------------------------------------- Physical Exam Details Patient Name: Date of Service: AVARAE, LENGERICH 06/10/2022 8:00 A M Medical Record Number: [id number] Patient Account Number: [id number] Date of Birth/Sex: Treating RN: Feb 19, 1923 (89 y.o. F) Primary Care Provider: Eloisa Northern Other Clinician: Referring Provider: Treating Provider/Extender: Bess Kinds Weeks in Treatment: 0 Constitutional supine blood pressure is within target range for patient.. pulse regular and within target range for patient.Marland Kitchen respirations regular, non-labored and within target range for patient.. Well-nourished and well-hydrated in no acute distress. Eyes conjunctiva clear no eyelid edema noted. pupils equal round and reactive to light and accommodation. Ears, Nose, Mouth, and Throat no gross abnormality of ear auricles or external auditory canals. normal hearing noted during conversation. mucus membranes moist. Respiratory normal breathing without difficulty. Cardiovascular Absent posterior tibial and dorsalis pedis pulses bilateral lower extremities. trace pitting edema of the bilateral lower extremities. Musculoskeletal normal gait and posture. no significant deformity or arthritic changes, no loss or range of motion, no clubbing. Psychiatric this patient is able to make decisions and demonstrates good insight into disease process. Alert and Oriented x 3. pleasant and cooperative. Notes Upon inspection patient's wound bed actually showed signs of significant necrotic tissue noted at this point. I do believe that she is still having some pretty severe issues here with discomfort but at the same time I think that she does have osteomyelitis I  do believe that she also has a very low chance of getting this to completely closed. I discussed with her today this fact and the fact that to be honest I am not even sure that I would necessarily recommend that she proceed with amputation which would be above-knee amputation but at the same time I think that it is very unlikely that we will be able to get her completely healed either with just standard wound care measures. This is a little different from the previous situation where I saw her and at that point she had much better flow into her extremities. That is completely different from what we are seeing now. Nonetheless after discussing everything with the patient for now I am going to have her and her friend that is also her caregiver continue to discuss this and let me know which direction we want to proceed with. Electronic Signature(s) Signed: 06/10/2022 5:43:35 PM By: Allen Derry PA-C Entered By: Allen Derry on 06/10/2022 17:43:35 Physician Orders Details -------------------------------------------------------------------------------- [name] ([id number]) [id number] Page 3 of 9 Patient Name: Date of Service: NAHOMI, ZASADA 06/10/2022 8:00 A M Medical Record Number: [id number] Patient Account Number: [id number] Date of Birth/Sex: Treating RN: [birth year] (89 y.o. Orville Govern Primary Care Provider: Eloisa Northern Other Clinician: Referring Provider: Treating Provider/Extender: Gaynelle Arabian in Treatment: 0 Verbal / Phone Orders: No Diagnosis Coding ICD-10 Coding Code Description I73.89 Other specified peripheral vascular diseases L97.818 Non-pressure chronic ulcer  of other part of right lower leg with other specified severity L89.610 Pressure ulcer of right heel, unstageable I10 Essential (primary) hypertension J44.9 Chronic obstructive pulmonary disease, unspecified M62.81 Muscle weakness (generalized) I48.0  Paroxysmal atrial fibrillation Follow-up Appointments ppointment in 2 weeks. Allen Derry, PA Return A Anesthetic Wound #2 Right,Posterior Lower Leg (In clinic) Topical Lidocaine 4% applied to wound bed Wound #3 Right Calcaneus (In clinic) Topical Lidocaine 4% applied to wound bed Bathing/ Shower/ Hygiene May shower and wash wound with soap and water. Off-Loading Wound #2 Right,Posterior Lower Leg Prevalon Boot - DO NOT USE ANY COMPRESSION TO LEGS Prevalon boots on at all times Wound #3 Right Calcaneus Prevalon Boot Wound Treatment Wound #2 - Lower Leg Wound Laterality: Right, Posterior Topical: Betadine 1 x Per Day/30 Days Secondary Dressing: ABD Pad, 8x10 1 x Per Day/30 Days Discharge Instructions: Apply over primary dressing as directed. Secured With: [employer], 4.5x3.1 (in/yd) 1 x Per Day/30 Days Discharge Instructions: Secure with Kerlix as directed. Secured With: 69M Medipore H Soft Cloth Surgical T ape, 4 x 10 (in/yd) 1 x Per Day/30 Days Discharge Instructions: Secure with tape as directed. Wound #3 - Calcaneus Wound Laterality: Right Topical: Betadine 1 x Per Day/30 Days Secondary Dressing: ABD Pad, 8x10 1 x Per Day/30 Days Discharge Instructions: Apply over primary dressing as directed. Secured With: [employer], 4.5x3.1 (in/yd) 1 x Per Day/30 Days Discharge Instructions: Secure with Kerlix as directed. Secured With: 69M Medipore H Soft Cloth Surgical T ape, 4 x 10 (in/yd) 1 x Per Day/30 Days Discharge Instructions: Secure with tape as directed. Patient Medications llergies: meperidine, NSAIDS (Non-Steroidal Anti-Inflammatory Drug), Opioids - Morphine Analogues, tolmetin, Sulfa (Sulfonamide Antibiotics) A Notifications Medication Indication Start End prior to debridement 06/10/2022 lidocaine DOSE topical 4 % cream - cream topical once daily BONNETTE, WESSNER [name] ([id number]) [id number] Page 4 of 9 Electronic  Signature(s) Signed: 06/10/2022 4:43:09 PM By: Redmond Pulling RN, BSN Signed: 06/10/2022 5:51:12 PM By: Allen Derry PA-C Entered By: Redmond Pulling on 06/10/2022 36:14:43 -------------------------------------------------------------------------------- Problem List Details Patient Name: Date of Service: [name], [name] Burton [name]. 06/10/2022 8:00 A M Medical Record Number: [id number] Patient Account Number: [id number] Date of Birth/Sex: Treating RN: [birth year] (89 y.o. F) Primary Care Provider: Eloisa Northern Other Clinician: Referring Provider: Treating Provider/Extender: Gaynelle Arabian in Treatment: 0 Active Problems ICD-10 Encounter Code Description Active Date MDM Diagnosis I73.89 Other specified peripheral vascular diseases 06/10/2022 No Yes L97.818 Non-pressure chronic ulcer of other part of right lower leg with other specified 06/10/2022 No Yes severity L89.610 Pressure ulcer of right heel, unstageable 06/10/2022 No Yes I10 Essential (primary) hypertension 06/10/2022 No Yes J44.9 Chronic obstructive pulmonary disease, unspecified 06/10/2022 No Yes M62.81 Muscle weakness (generalized) 06/10/2022 No Yes I48.0 Paroxysmal atrial fibrillation 06/10/2022 No Yes Inactive Problems Resolved Problems Electronic Signature(s) Signed: 06/10/2022 9:13:29 AM By: Allen Derry PA-C Entered By: Allen Derry on 06/10/2022 09:13:29 -------------------------------------------------------------------------------- Progress Note Details Patient Name: Date of Service: [name]. 06/10/2022 8:00 A M Medical Record Number: [id number] Patient Account Number: [id number] Date of Birth/Sex: Treating RN: [birth year] (89 y.o. F) Primary Care Provider: Eloisa Northern Other Clinician: Referring Provider: Treating Provider/Extender: Daine Gip, Criss Rosales in Treatment: 0 [place], [place] [name] ([id number]) [id number] Page 5 of 9 Subjective Chief Complaint Information  obtained from Patient Right LE Ulcers History of Present Illness (HPI) 01/08/2021 patient presents today for evaluation of a pressure ulcer which began after she fell in July and broke her arm.  This unfortunately has led to her being overall more generally weak and she is not really up and moving around much at all. While she was in the hospital she developed the heel ulcers bilaterally the right was more deep tissue injury that pretty much appears to be healed at this point. At the current skilled nursing facility they have actually been doing a great job taking care of her according to her friend who also helps take care of her. Subsequently the patient's wound on the left heel is eschar covered and probably would benefit from clearing away this eschar so that she can actually see some improvements hopefully. Currently they have been utilizing Iodosorb along with a border foam dressing. Patient has a history of hypertension, COPD, generalized muscle weakness, and atrial fibrillation. Due to her age they elected not to place her on any blood thinners for the atrial fibrillation. 01/15/2021 upon evaluation today patient appears to be doing better in regard to the heel ulcer. I do not have initially upon evaluation today her arterial studies that were done at the facility. Therefore I did not perform any sharp debridement while she was present during the office visit today. Nonetheless I am going to suggest based on what was seen currently that we probably once we get this with you at everything appears to be okay consider stop debridement at the next visit. 01/29/2021 upon evaluation today patient appears to be doing well with regard to her wound all things considered her blood flow is definitely not very good based on the review of the arterial study although it was really incomplete in my opinion there was not even a brachial reading to compare to but nonetheless the readings in the extremities  were very poor. In the end I did actually discuss with her going ahead and going to vascular and they have already get that appointment set up which is great news. Fortunately there does not appear to be signs of infection currently. The wound is really not making tremendous progress but again I would not expect it to do so with where things stand currently until we can get this moving a little bit better direction. 02/19/2021 upon evaluation today patient's wound actually showing signs of doing about the same the still very thick region of eschar noted currently. I do think that based on the fact that there really is nothing from a vascular standpoint recommend currently which I completely understand that this is still something that is going to require as aggressive as we can wound care obviously I am not going to perform any debridement but nonetheless I do think that good wound care including Santyl can be beneficial for the patient currently. 03/12/2021 upon evaluation today patient appears to be doing well with regard to her wound. Fortunately there does not appear to be any signs of active infection at this time which is great news. No fevers, chills, nausea, vomiting, or diarrhea. I do feel like the Melburn Popper is doing a good job although we need to make sure that they are put in a saline moistened gauze and behind to make sure this stays nice and moist it seems to be a little bit dry. 04/02/2021 upon evaluation today patient's wound is actually showing signs of good improvement. Fortunately there does not appear to be any evidence of active infection locally nor systemically at this time which is great news. No fevers, chills, nausea, vomiting, or diarrhea. 04/23/2021 upon evaluation today patient's wound is actually showing signs  of being a little bit smaller which is good news and I think that we are headed in the right direction. Obviously this is something that is going to continue to slowly  progress I believe and again as long as we keep it clean and keep it from getting infected that is going to be the big thing. 05/14/2021: Today, the wound has contracted even more. There continues to be loose but somewhat thick yellow slough in the wound base. The wound is moist. 06-04-2021 upon evaluation today patient appears to be doing well currently in regard to her heel ulcer. I am actually extremely pleased with where we stand and I think the patient is making good progress here. There does not appear to be any signs of active infection locally or systemically which is great news. 06-25-2021 upon evaluation today patient's wound is actually showing signs of excellent improvement. I am actually very pleased with where we stand and I think the patient is making excellent progress. There does not appear to be any signs of active infection at this time. No fevers, chills, nausea, vomiting, or diarrhea. 07-23-2021 upon evaluation today patient's wound actually appears to be doing quite well. Fortunately there does not appear to be any evidence of active infection locally nor systemically which is great news. No fevers, chills, nausea, vomiting, or diarrhea. Readmission: 06-10-2022 upon evaluation today patient presents for initial inspection here in our clinic concerning issues that she has been having with wounds over the right lower extremity. This is part of the leg as well as the ankle region unfortunately there is significant necrosis noted in the way of eschar. Actually did perform a fairly extensive record review through epic today. With that being said unfortunately the patient continues to have a lot of issues here with her arterial flow I did review Dr. [name] note and there does not appear to be any simple way to fix this blood flow. In fact he states that she is not a candidate for revascularization at this point she is looking at basically more palliative care versus a above-knee amputation  of this extremity. Again this is an unfortunate situation of being but again at 89 years old I discussed with the patient that I am not sure I would recommend going the extensive route of a amputation surgery especially in light of the fact that she never know if that is going to turn out to be as good is everybody is hoping as it stands anyway. She voiced understanding. With that being said for now she is really not super interested in going with amputation but nonetheless is a little unsure of what exactly to do. She does I think have a very low chance of getting this to actually heal completely. 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 Patient History Information obtained from Patient, Caregiver. Allergies meperidine, NSAIDS (Non-Steroidal Anti-Inflammatory Drug), Opioids - Morphine Analogues, tolmetin, Sulfa (Sulfonamide Antibiotics) Family History Cancer - Siblings, Hypertension - Father,Mother, Stroke - Siblings, No family history of Diabetes, Heart Disease, Hereditary Spherocytosis, Kidney Disease, Lung Disease, Seizures, Thyroid Problems, Tuberculosis. Social History Never smoker, Alcohol Use - Never, Drug Use - No History, Caffeine Use - Rarely. Medical History Eyes Patient has history of Cataracts - Removed SHASTINA, RUA [name] ([id number]) [id number] Page 6 of 9 Respiratory Patient has history of Chronic Obstructive Pulmonary Disease (  COPD) Cardiovascular Patient has history of Arrhythmia, Hypertension Musculoskeletal Patient has history of Osteoarthritis Oncologic Patient has history of Received Radiation Denies history of Received Chemotherapy Medical A Surgical History Notes nd Ear/Nose/Mouth/Throat Hard of  Hearing Respiratory Sarcoidosis Endocrine Hypothyroidism Oncologic Right-Breast Cancer Objective Constitutional supine blood pressure is within target range for patient.. pulse regular and within target range for patient.Marland Kitchen respirations regular, non-labored and within target range for patient.. Well-nourished and well-hydrated in no acute distress. Vitals Time Taken: 8:20 AM, Pulse: 59 bpm, Respiratory Rate: 18 breaths/min, Blood Pressure: 131/72 mmHg. Eyes conjunctiva clear no eyelid edema noted. pupils equal round and reactive to light and accommodation. Ears, Nose, Mouth, and Throat no gross abnormality of ear auricles or external auditory canals. normal hearing noted during conversation. mucus membranes moist. Respiratory normal breathing without difficulty. Cardiovascular Absent posterior tibial and dorsalis pedis pulses bilateral lower extremities. trace pitting edema of the bilateral lower extremities. Musculoskeletal normal gait and posture. no significant deformity or arthritic changes, no loss or range of motion, no clubbing. Psychiatric this patient is able to make decisions and demonstrates good insight into disease process. Alert and Oriented x 3. pleasant and cooperative. General Notes: Upon inspection patient's wound bed actually showed signs of significant necrotic tissue noted at this point. I do believe that she is still having some pretty severe issues here with discomfort but at the same time I think that she does have osteomyelitis I do believe that she also has a very low chance of getting this to completely closed. I discussed with her today this fact and the fact that to be honest I am not even sure that I would necessarily recommend that she proceed with amputation which would be above-knee amputation but at the same time I think that it is very unlikely that we will be able to get her completely healed either with just standard wound care measures. This is a  little different from the previous situation where I saw her and at that point she had much better flow into her extremities. That is completely different from what we are seeing now. Nonetheless after discussing everything with the patient for now I am going to have her and her friend that is also her caregiver continue to discuss this and let me know which direction we want to proceed with. Integumentary (Hair, Skin) Wound #2 status is Open. Original cause of wound was Pressure Injury. The date acquired was: 05/27/2022. The wound is located on the Right,Posterior Lower Leg. The wound measures 6.8cm length x 5.4cm width x 0.1cm depth; 28.84cm^2 area and 2.884cm^3 volume. There is no tunneling or undermining noted. There is a medium amount of serosanguineous drainage noted. There is no granulation within the wound bed. There is a large (67-100%) amount of necrotic tissue within the wound bed including Eschar and Adherent Slough. Wound #3 status is Open. Original cause of wound was Pressure Injury. The date acquired was: 05/27/2022. The wound is located on the Right Calcaneus. The wound measures 10cm length x 7.5cm width x 0.1cm depth; 58.905cm^2 area and 5.89cm^3 volume. There is no tunneling or undermining noted. There is a medium amount of serosanguineous drainage noted. There is no granulation within the wound bed. There is a large (67-100%) amount of necrotic tissue within the wound bed including Eschar and Adherent Slough. Assessment Active Problems ICD-10 Other specified peripheral vascular diseases Non-pressure chronic ulcer of other part of right lower leg with other specified severity Pressure ulcer of right heel, unstageable Essential (primary)  hypertension SWAY, GUTTIERREZ [name] ([id number]) [id number] Page 7 of 9 Chronic obstructive pulmonary disease, unspecified Muscle weakness (generalized) Paroxysmal atrial fibrillation Plan Follow-up  Appointments: Return Appointment in 2 weeks. Allen Derry, PA Anesthetic: Wound #2 Right,Posterior Lower Leg: (In clinic) Topical Lidocaine 4% applied to wound bed Wound #3 Right Calcaneus: (In clinic) Topical Lidocaine 4% applied to wound bed Bathing/ Shower/ Hygiene: May shower and wash wound with soap and water. Off-Loading: Wound #2 Right,Posterior Lower Leg: Prevalon Boot - DO NOT USE ANY COMPRESSION TO LEGS Prevalon boots on at all times Wound #3 Right Calcaneus: Prevalon Boot The following medication(s) was prescribed: lidocaine topical 4 % cream cream topical once daily for prior to debridement was prescribed at facility WOUND #2: - Lower Leg Wound Laterality: Right, Posterior Topical: Betadine 1 x Per Day/30 Days Secondary Dressing: ABD Pad, 8x10 1 x Per Day/30 Days Discharge Instructions: Apply over primary dressing as directed. Secured With: [employer], 4.5x3.1 (in/yd) 1 x Per Day/30 Days Discharge Instructions: Secure with Kerlix as directed. Secured With: 56M Medipore H Soft Cloth Surgical T ape, 4 x 10 (in/yd) 1 x Per Day/30 Days Discharge Instructions: Secure with tape as directed. WOUND #3: - Calcaneus Wound Laterality: Right Topical: Betadine 1 x Per Day/30 Days Secondary Dressing: ABD Pad, 8x10 1 x Per Day/30 Days Discharge Instructions: Apply over primary dressing as directed. Secured With: [employer], 4.5x3.1 (in/yd) 1 x Per Day/30 Days Discharge Instructions: Secure with Kerlix as directed. Secured With: 56M Medipore H Soft Cloth Surgical T ape, 4 x 10 (in/yd) 1 x Per Day/30 Days Discharge Instructions: Secure with tape as directed. 1. Based on what I am seeing I do believe that the patient's best bet may be to proceed with more of a palliative measure at this point as far as where things stand. She voiced understanding. With that being said I do think as well that we need to have her continue with the ABD pad to cover over top of the  Betadine. Basically I want to try to keep this dry I think the ideal thing is not to be to soften this up but just to let the eschar stay stable and dry especially in light of her poor arterial flow. 2. I do believe what we are looking at right now is more of a palliative measure. Obviously if she decides to proceed with the amputation surgery then that is definitely her choice but again that is not something that I am pushing for by any means and I made that clear to her and her friend/caregiver today. 3. I would also recommend that the patient should continue to monitor for any pressure to the foot she needs to be wearing the Prevalon offloading boot I would recommend a pillow underneath her leg to help prevent anything from causing abnormal pressures as well. We will see patient back for reevaluation in 2 weeks here in the clinic. If anything worsens or changes patient will contact our office for additional recommendations. Electronic Signature(s) Signed: 06/10/2022 5:50:00 PM By: Allen Derry PA-C Entered By: Allen Derry on 06/10/2022 17:50:00 -------------------------------------------------------------------------------- HxROS Details Patient Name: Date of Service: [name], [name] Burton [name]. 06/10/2022 8:00 A M Medical Record Number: [id number] Patient Account Number: [id number] Date of Birth/Sex: Treating RN: 09/30/1922 (89 y.o. Orville Govern Primary Care Provider: Eloisa Northern Other Clinician: Referring Provider: Treating Provider/Extender: Gaynelle Arabian in Treatment: 0 Information Obtained From Patient Caregiver Eyes Medical History: Positive for: Cataracts - Removed  QUIN, MCPHERSON [name] ([id number]) [id number] Page 8 of 9 Ear/Nose/Mouth/Throat Medical History: Past Medical History Notes: Hard of Hearing Respiratory Medical History: Positive for: Chronic Obstructive Pulmonary Disease (COPD) Past Medical History  Notes: Sarcoidosis Cardiovascular Medical History: Positive for: Arrhythmia; Hypertension Endocrine Medical History: Past Medical History Notes: Hypothyroidism Musculoskeletal Medical History: Positive for: Osteoarthritis Oncologic Medical History: Positive for: Received Radiation Negative for: Received Chemotherapy Past Medical History Notes: Right-Breast Cancer HBO Extended History Items Eyes: Cataracts Immunizations Pneumococcal Vaccine: Received Pneumococcal Vaccination: Yes Received Pneumococcal Vaccination On or After 60th Birthday: Yes Implantable Devices None Family and Social History Cancer: Yes - Siblings; Diabetes: No; Heart Disease: No; Hereditary Spherocytosis: No; Hypertension: Yes - Father,Mother; Kidney Disease: No; Lung Disease: No; Seizures: No; Stroke: Yes - Siblings; Thyroid Problems: No; Tuberculosis: No; Never smoker; Alcohol Use: Never; Drug Use: No History; Caffeine Use: Rarely; Financial Concerns: No; Food, Clothing or Shelter Needs: No; Support System Lacking: No; Transportation Concerns: No Electronic Signature(s) Signed: 06/10/2022 4:43:09 PM By: Redmond Pulling RN, BSN Signed: 06/10/2022 5:51:12 PM By: Allen Derry PA-C Entered By: Redmond Pulling on 06/10/2022 08:29:07 -------------------------------------------------------------------------------- SuperBill Details Patient Name: Date of Service: [name], [name] Burton [name]. 06/10/2022 Medical Record Number: [id number] Patient Account Number: [id number] Date of Birth/Sex: Treating RN: May 24, 1922 (89 y.o. Orville Govern Primary Care Provider: Eloisa Northern Other Clinician: Referring Provider: Treating Provider/Extender: Daine Gip, [name] Weeks in Treatment: 0 Diagnosis Coding ICD-10 Codes Code Description SHALANDRA, LEU ([id number]) [id number] Page 9 of 9 I73.89 Other specified peripheral vascular diseases L97.818 Non-pressure chronic ulcer of other part of  right lower leg with other specified severity L89.610 Pressure ulcer of right heel, unstageable I10 Essential (primary) hypertension J44.9 Chronic obstructive pulmonary disease, unspecified M62.81 Muscle weakness (generalized) I48.0 Paroxysmal atrial fibrillation Facility Procedures : CPT4 Code: [id number] Description: 99214 - WOUND CARE VISIT-LEV 4 EST PT Modifier: 25 Quantity: 1 Physician Procedures : CPT4 Code Description Modifier [id number] 99214 - WC PHYS LEVEL 4 - EST PT ICD-10 Diagnosis Description I73.89 Other specified peripheral vascular diseases L97.818 Non-pressure chronic ulcer of other part of right lower leg with other specified severity  L89.610 Pressure ulcer of right heel, unstageable I10 Essential (primary) hypertension Quantity: 1 Electronic Signature(s) Signed: 06/10/2022 5:50:45 PM By: Allen Derry PA-C Previous Signature: 06/10/2022 4:43:09 PM Version By: Redmond Pulling RN, BSN Entered By: Allen Derry on 06/10/2022 17:50:44

## 2022-06-18 DIAGNOSIS — L97311 Non-pressure chronic ulcer of right ankle limited to breakdown of skin: Secondary | ICD-10-CM | POA: Diagnosis not present

## 2022-06-18 DIAGNOSIS — L89610 Pressure ulcer of right heel, unstageable: Secondary | ICD-10-CM | POA: Diagnosis not present

## 2022-06-18 DIAGNOSIS — L89896 Pressure-induced deep tissue damage of other site: Secondary | ICD-10-CM | POA: Diagnosis not present

## 2022-06-18 DIAGNOSIS — L97811 Non-pressure chronic ulcer of other part of right lower leg limited to breakdown of skin: Secondary | ICD-10-CM | POA: Diagnosis not present

## 2022-06-24 ENCOUNTER — Encounter (HOSPITAL_BASED_OUTPATIENT_CLINIC_OR_DEPARTMENT_OTHER): Payer: Medicare PPO | Admitting: Physician Assistant

## 2022-06-24 DIAGNOSIS — J449 Chronic obstructive pulmonary disease, unspecified: Secondary | ICD-10-CM | POA: Diagnosis not present

## 2022-06-24 DIAGNOSIS — L89610 Pressure ulcer of right heel, unstageable: Secondary | ICD-10-CM | POA: Diagnosis not present

## 2022-06-24 DIAGNOSIS — I10 Essential (primary) hypertension: Secondary | ICD-10-CM | POA: Diagnosis not present

## 2022-06-24 DIAGNOSIS — Z8249 Family history of ischemic heart disease and other diseases of the circulatory system: Secondary | ICD-10-CM | POA: Diagnosis not present

## 2022-06-24 DIAGNOSIS — L97818 Non-pressure chronic ulcer of other part of right lower leg with other specified severity: Secondary | ICD-10-CM | POA: Diagnosis not present

## 2022-06-24 DIAGNOSIS — L89899 Pressure ulcer of other site, unspecified stage: Secondary | ICD-10-CM | POA: Diagnosis not present

## 2022-06-24 DIAGNOSIS — M6281 Muscle weakness (generalized): Secondary | ICD-10-CM | POA: Diagnosis not present

## 2022-06-24 DIAGNOSIS — I480 Paroxysmal atrial fibrillation: Secondary | ICD-10-CM | POA: Diagnosis not present

## 2022-06-24 DIAGNOSIS — Z7401 Bed confinement status: Secondary | ICD-10-CM | POA: Diagnosis not present

## 2022-06-25 ENCOUNTER — Telehealth (INDEPENDENT_AMBULATORY_CARE_PROVIDER_SITE_OTHER): Payer: Medicare PPO | Admitting: Internal Medicine

## 2022-06-25 ENCOUNTER — Telehealth: Payer: Self-pay

## 2022-06-25 ENCOUNTER — Encounter (HOSPITAL_BASED_OUTPATIENT_CLINIC_OR_DEPARTMENT_OTHER): Payer: Medicare PPO | Admitting: Internal Medicine

## 2022-06-25 ENCOUNTER — Ambulatory Visit (HOSPITAL_BASED_OUTPATIENT_CLINIC_OR_DEPARTMENT_OTHER): Payer: Medicare PPO | Admitting: Internal Medicine

## 2022-06-25 ENCOUNTER — Other Ambulatory Visit: Payer: Self-pay

## 2022-06-25 DIAGNOSIS — L89890 Pressure ulcer of other site, unstageable: Secondary | ICD-10-CM | POA: Diagnosis not present

## 2022-06-25 DIAGNOSIS — I739 Peripheral vascular disease, unspecified: Secondary | ICD-10-CM

## 2022-06-25 MED ORDER — DOXYCYCLINE HYCLATE 100 MG PO TABS: 100.0000 mg | ORAL_TABLET | Freq: Two times a day (BID) | ORAL | 0 refills | Status: AC

## 2022-06-25 MED ORDER — AMOXICILLIN-POT CLAVULANATE 875-125 MG PO TABS: 1.0000 | ORAL_TABLET | Freq: Two times a day (BID) | ORAL | 0 refills | Status: AC

## 2022-06-25 NOTE — Progress Notes (Addendum)
BRYCELYN, GAMBINO R ([id number]) [id number] Page 1 of 7 Visit Report for 06/24/2022 Chief Complaint Document Details Patient Name: Date of Service: [name], [name] 06/24/2022 1:15 PM Medical Record Number: [id number] Patient Account Number: [id number] Date of Birth/Sex: Treating RN: 17-Jan-1923 (89 y.o. F) Primary Care Provider: Eloisa Northern Other Clinician: Referring Provider: Treating Provider/Extender: Gaynelle Arabian in Treatment: 2 Information Obtained from: Patient Chief Complaint Right LE Ulcers Electronic Signature(s) Signed: 06/24/2022 1:29:22 PM By: Allen Derry PA-C Entered By: Allen Derry on 06/24/2022 13:29:22 -------------------------------------------------------------------------------- HPI Details Patient Name: Date of Service: [name], [name] 06/24/2022 1:15 PM Medical Record Number: [id number] Patient Account Number: [id number] Date of Birth/Sex: Treating RN: 07-03-1922 (89 y.o. F) Primary Care Provider: Eloisa Northern Other Clinician: Referring Provider: Treating Provider/Extender: Gaynelle Arabian in Treatment: 2 History of Present Illness HPI Description: 01/08/2021 patient presents today for evaluation of a pressure ulcer which began after she fell in July and broke her arm. This unfortunately has led to her being overall more generally weak and she is not really up and moving around much at all. While she was in the hospital she developed the heel ulcers bilaterally the right was more deep tissue injury that pretty much appears to be healed at this point. At the current skilled nursing facility they have actually been doing a great job taking care of her according to her friend who also helps take care of her. Subsequently the patient's wound on the left heel is eschar covered and probably would benefit from clearing away this eschar so that she can actually see some improvements hopefully. Currently they  have been utilizing Iodosorb along with a border foam dressing. Patient has a history of hypertension, COPD, generalized muscle weakness, and atrial fibrillation. Due to her age they elected not to place her on any blood thinners for the atrial fibrillation. 01/15/2021 upon evaluation today patient appears to be doing better in regard to the heel ulcer. I do not have initially upon evaluation today her arterial studies that were done at the facility. Therefore I did not perform any sharp debridement while she was present during the office visit today. Nonetheless I am going to suggest based on what was seen currently that we probably once we get this with you at everything appears to be okay consider stop debridement at the next visit. 01/29/2021 upon evaluation today patient appears to be doing well with regard to her wound all things considered her blood flow is definitely not very good based on the review of the arterial study although it was really incomplete in my opinion there was not even a brachial reading to compare to but nonetheless the readings in the extremities were very poor. In the end I did actually discuss with her going ahead and going to vascular and they have already get that appointment set up which is great news. Fortunately there does not appear to be signs of infection currently. The wound is really not making tremendous progress but again I would not expect it to do so with where things stand currently until we can get this moving a little bit better direction. 02/19/2021 upon evaluation today patient's wound actually showing signs of doing about the same the still very thick region of eschar noted currently. I do think that based on the fact that there really is nothing from a vascular standpoint recommend currently which I completely understand that this is still something that is going to require  as aggressive as we can wound care obviously I am not going to perform any  debridement but nonetheless I do think that good wound care including Santyl can be beneficial for the patient currently. 03/12/2021 upon evaluation today patient appears to be doing well with regard to her wound. Fortunately there does not appear to be any signs of active infection at this time which is great news. No fevers, chills, nausea, vomiting, or diarrhea. I do feel like the [facility] Popper is doing a good job although we need to make sure that they are put in a saline moistened gauze and behind to make sure this stays nice and moist it seems to be a little bit dry. 04/02/2021 upon evaluation today patient's wound is actually showing signs of good improvement. Fortunately there does not appear to be any evidence of active infection locally nor systemically at this time which is great news. No fevers, chills, nausea, vomiting, or diarrhea. 04/23/2021 upon evaluation today patient's wound is actually showing signs of being a little bit smaller which is good news and I think that we are headed in the right direction. Obviously this is something that is going to continue to slowly progress I believe and again as long as we keep it clean and keep it from getting infected that is going to be the big thing. 05/14/2021: Today, the wound has contracted even more. There continues to be loose but somewhat thick yellow slough in the wound base. The wound is moist. 06-04-2021 upon evaluation today patient appears to be doing well currently in regard to her heel ulcer. I am actually extremely pleased with where we stand and I think the patient is making good progress here. There does not appear to be any signs of active infection locally or systemically which is great news. ZONNIE, LANDEN R ([id number]) [id number] Page 2 of 7 06-25-2021 upon evaluation today patient's wound is actually showing signs of excellent improvement. I am actually very pleased with where we stand and I think the  patient is making excellent progress. There does not appear to be any signs of active infection at this time. No fevers, chills, nausea, vomiting, or diarrhea. 07-23-2021 upon evaluation today patient's wound actually appears to be doing quite well. Fortunately there does not appear to be any evidence of active infection locally nor systemically which is great news. No fevers, chills, nausea, vomiting, or diarrhea. Readmission: 06-10-2022 upon evaluation today patient presents for initial inspection here in our clinic concerning issues that she has been having with wounds over the right lower extremity. This is part of the leg as well as the ankle region unfortunately there is significant necrosis noted in the way of eschar. Actually did perform a fairly extensive record review through epic today. With that being said unfortunately the patient continues to have a lot of issues here with her arterial flow I did review Dr. [name] note and there does not appear to be any simple way to fix this blood flow. In fact he states that she is not a candidate for revascularization at this point she is looking at basically more palliative care versus a above-knee amputation of this extremity. Again this is an unfortunate situation of being but again at 89 years old I discussed with the patient that I am not sure I would recommend going the extensive route of a amputation surgery especially in light of the fact that she never know if that is going to turn out to be as good is  everybody is hoping as it stands anyway. She voiced understanding. With that being said for now she is really not super interested in going with amputation but nonetheless is a little unsure of what exactly to do. She does I think have a very low chance of getting this to actually heal completely. 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 06-24-2022 upon evaluation today patient appears to be doing poorly still in regard to her leg ulcerations. Fortunately I do not see any signs of infection spreading systemically which is good news. However I do feel like that locally she may have some infection and notes she is good to be meeting virtually with Dr. Ilsa Iha tomorrow who is an infectious disease specialist. Subsequently also noted that her blood flow was not optimal so our goal right now is more of a palliative option as far as this is concerned were going to try to keep her wounds clean from being infected and hopefully keep her as healthy as possible for as long as possible. Electronic Signature(s) Signed: 06/24/2022 4:56:38 PM By: Allen Derry PA-C Entered By: Allen Derry on 06/24/2022 16:56:38 -------------------------------------------------------------------------------- Physical Exam Details Patient Name: Date of Service: LULLA, LINVILLE 06/24/2022 1:15 PM Medical Record Number: [id number] Patient Account Number: [id number] Date of Birth/Sex: Treating RN: 21-Dec-1922 (89 y.o. F) Primary Care Provider: Eloisa Northern Other Clinician: Referring Provider: Treating Provider/Extender: Gaynelle Arabian in Treatment: 2 Constitutional Chronically ill appearing but in no apparent acute distress. Respiratory normal breathing without difficulty. Psychiatric this patient is able to make decisions and demonstrates good insight into disease process. Alert and Oriented x 3. pleasant and cooperative. Notes Upon inspection no debridement was undertaken today she has a significant amount of eschar again there is really not much we can do she does not have good blood flow and there really is not any way to improve this. For that reason truly I think that this is good to be more of a palliative management of her wounds at this point. I  discussed this with the patient as well as with her friend and nephew today. Subsequently the only other option would be an above-knee amputation which she wants to avoid at this time. Electronic Signature(s) Signed: 06/24/2022 4:57:17 PM By: Allen Derry PA-C Entered By: Allen Derry on 06/24/2022 16:57:17 -------------------------------------------------------------------------------- Physician Orders Details Patient Name: Date of Service: [name], [name] 06/24/2022 1:15 PM Medical Record Number: [id number] Patient Account Number: [id number] Date of Birth/Sex: Treating RN: 09/29/1922 (89 y.o. Ardis Rowan, [name] Primary Care Provider: Eloisa Northern Other Clinician: Referring Provider: Treating Provider/Extender: Gaynelle Arabian in Treatment: 2 Verbal / Phone Orders: No Diagnosis Coding EXIE, CHRISMER R ([id number]) [id number] Page 3 of 7 ICD-10 Coding Code Description I73.89 Other specified peripheral vascular diseases L97.818 Non-pressure chronic ulcer of other part of right lower leg with other specified severity L89.610 Pressure ulcer of right heel, unstageable I10 Essential (primary) hypertension J44.9 Chronic obstructive pulmonary disease, unspecified M62.81 Muscle weakness (generalized) I48.0 Paroxysmal atrial fibrillation Follow-up Appointments ppointment in 2 weeks. - Allen Derry, PA Return A ***Hoyer*** Do not put right before lunch or at the end of day*** Anesthetic Wound #2 Right,Posterior Lower Leg (In clinic) Topical Lidocaine 4% applied to wound bed Wound #3 Right Calcaneus (In clinic) Topical Lidocaine 4% applied to wound bed Bathing/  Shower/ Hygiene May shower and wash wound with soap and water. Off-Loading Wound #2 Right,Posterior Lower Leg Prevalon Boot - DO NOT USE ANY COMPRESSION TO LEGS Prevalon boots on at all times Wound #3 Right Calcaneus Prevalon Boot Wound Treatment Wound #2 - Lower Leg Wound  Laterality: Right, Posterior Topical: Betadine 1 x Per Day/30 Days Secondary Dressing: ABD Pad, 8x10 1 x Per Day/30 Days Discharge Instructions: Apply over primary dressing as directed. Secured With: [employer], 4.5x3.1 (in/yd) 1 x Per Day/30 Days Discharge Instructions: Secure with Kerlix as directed. Secured With: 87M Medipore H Soft Cloth Surgical T ape, 4 x 10 (in/yd) 1 x Per Day/30 Days Discharge Instructions: Secure with tape as directed. Wound #3 - Calcaneus Wound Laterality: Right Topical: Betadine 1 x Per Day/30 Days Secondary Dressing: ABD Pad, 8x10 1 x Per Day/30 Days Discharge Instructions: Apply over primary dressing as directed. Secured With: [employer], 4.5x3.1 (in/yd) 1 x Per Day/30 Days Discharge Instructions: Secure with Kerlix as directed. Secured With: 87M Medipore H Soft Cloth Surgical T ape, 4 x 10 (in/yd) 1 x Per Day/30 Days Discharge Instructions: Secure with tape as directed. Wound #4 - Foot Wound Laterality: Dorsal, Right Topical: Betadine 1 x Per Day/30 Days Secondary Dressing: ABD Pad, 8x10 1 x Per Day/30 Days Discharge Instructions: Apply over primary dressing as directed. Secured With: [employer], 4.5x3.1 (in/yd) 1 x Per Day/30 Days Discharge Instructions: Secure with Kerlix as directed. Secured With: 87M Medipore H Soft Cloth Surgical T ape, 4 x 10 (in/yd) 1 x Per Day/30 Days Discharge Instructions: Secure with tape as directed. Electronic Signature(s) Signed: 06/24/2022 4:00:00 PM By: Fonnie Mu RN Acquanetta Belling, Carmin R ([id number]) [id number] Page 4 of 7 Signed: 06/24/2022 5:22:52 PM By: Allen Derry PA-C Entered By: Fonnie Mu on 06/24/2022 14:19:31 -------------------------------------------------------------------------------- Problem List Details Patient Name: Date of Service: [name], [name] 06/24/2022 1:15 PM Medical Record Number: [id number] Patient Account Number: [id number] Date  of Birth/Sex: Treating RN: 13-Jul-1922 (89 y.o. F) Primary Care Provider: Eloisa Northern Other Clinician: Referring Provider: Treating Provider/Extender: Gaynelle Arabian in Treatment: 2 Active Problems ICD-10 Encounter Code Description Active Date MDM Diagnosis I73.89 Other specified peripheral vascular diseases 06/10/2022 No Yes L97.818 Non-pressure chronic ulcer of other part of right lower leg with other specified 06/10/2022 No Yes severity L89.610 Pressure ulcer of right heel, unstageable 06/10/2022 No Yes I10 Essential (primary) hypertension 06/10/2022 No Yes J44.9 Chronic obstructive pulmonary disease, unspecified 06/10/2022 No Yes M62.81 Muscle weakness (generalized) 06/10/2022 No Yes I48.0 Paroxysmal atrial fibrillation 06/10/2022 No Yes Inactive Problems Resolved Problems Electronic Signature(s) Signed: 06/24/2022 1:29:11 PM By: Allen Derry PA-C Entered By: Allen Derry on 06/24/2022 13:29:10 -------------------------------------------------------------------------------- Progress Note Details Patient Name: Date of Service: Ginny Forth R. 06/24/2022 1:15 PM Medical Record Number: [id number] Patient Account Number: [id number] Date of Birth/Sex: Treating RN: 24-Apr-1922 (89 y.o. F) Primary Care Provider: Eloisa Northern Other Clinician: Referring Provider: Treating Provider/Extender: Gaynelle Arabian in Treatment: 2 Subjective Chief Complaint VIRGA, HALTIWANGER R ([id number]) [id number] Page 5 of 7 Information obtained from Patient Right LE Ulcers History of Present Illness (HPI) 01/08/2021 patient presents today for evaluation of a pressure ulcer which began after she fell in July and broke her arm. This unfortunately has led to her being overall more generally weak and she is not really up and moving around much at all. While she was in the hospital she developed the heel ulcers bilaterally the right was more deep  tissue  injury that pretty much appears to be healed at this point. At the current skilled nursing facility they have actually been doing a great job taking care of her according to her friend who also helps take care of her. Subsequently the patient's wound on the left heel is eschar covered and probably would benefit from clearing away this eschar so that she can actually see some improvements hopefully. Currently they have been utilizing Iodosorb along with a border foam dressing. Patient has a history of hypertension, COPD, generalized muscle weakness, and atrial fibrillation. Due to her age they elected not to place her on any blood thinners for the atrial fibrillation. 01/15/2021 upon evaluation today patient appears to be doing better in regard to the heel ulcer. I do not have initially upon evaluation today her arterial studies that were done at the facility. Therefore I did not perform any sharp debridement while she was present during the office visit today. Nonetheless I am going to suggest based on what was seen currently that we probably once we get this with you at everything appears to be okay consider stop debridement at the next visit. 01/29/2021 upon evaluation today patient appears to be doing well with regard to her wound all things considered her blood flow is definitely not very good based on the review of the arterial study although it was really incomplete in my opinion there was not even a brachial reading to compare to but nonetheless the readings in the extremities were very poor. In the end I did actually discuss with her going ahead and going to vascular and they have already get that appointment set up which is great news. Fortunately there does not appear to be signs of infection currently. The wound is really not making tremendous progress but again I would not expect it to do so with where things stand currently until we can get this moving a little bit better  direction. 02/19/2021 upon evaluation today patient's wound actually showing signs of doing about the same the still very thick region of eschar noted currently. I do think that based on the fact that there really is nothing from a vascular standpoint recommend currently which I completely understand that this is still something that is going to require as aggressive as we can wound care obviously I am not going to perform any debridement but nonetheless I do think that good wound care including Santyl can be beneficial for the patient currently. 03/12/2021 upon evaluation today patient appears to be doing well with regard to her wound. Fortunately there does not appear to be any signs of active infection at this time which is great news. No fevers, chills, nausea, vomiting, or diarrhea. I do feel like the [employer] [facility] is doing a good job although we need to make sure that they are put in a saline moistened gauze and behind to make sure this stays nice and moist it seems to be a little bit dry. 04/02/2021 upon evaluation today patient's wound is actually showing signs of good improvement. Fortunately there does not appear to be any evidence of active infection locally nor systemically at this time which is great news. No fevers, chills, nausea, vomiting, or diarrhea. 04/23/2021 upon evaluation today patient's wound is actually showing signs of being a little bit smaller which is good news and I think that we are headed in the right direction. Obviously this is something that is going to continue to slowly progress I believe and again as long  as we keep it clean and keep it from getting infected that is going to be the big thing. 05/14/2021: Today, the wound has contracted even more. There continues to be loose but somewhat thick yellow slough in the wound base. The wound is moist. 06-04-2021 upon evaluation today patient appears to be doing well currently in regard to her heel ulcer. I am actually extremely pleased  with where we stand and I think the patient is making good progress here. There does not appear to be any signs of active infection locally or systemically which is great news. 06-25-2021 upon evaluation today patient's wound is actually showing signs of excellent improvement. I am actually very pleased with where we stand and I think the patient is making excellent progress. There does not appear to be any signs of active infection at this time. No fevers, chills, nausea, vomiting, or diarrhea. 07-23-2021 upon evaluation today patient's wound actually appears to be doing quite well. Fortunately there does not appear to be any evidence of active infection locally nor systemically which is great news. No fevers, chills, nausea, vomiting, or diarrhea. Readmission: 06-10-2022 upon evaluation today patient presents for initial inspection here in our clinic concerning issues that she has been having with wounds over the right lower extremity. This is part of the leg as well as the ankle region unfortunately there is significant necrosis noted in the way of eschar. Actually did perform a fairly extensive record review through epic today. With that being said unfortunately the patient continues to have a lot of issues here with her arterial flow I did review Dr. [name] note and there does not appear to be any simple way to fix this blood flow. In fact he states that she is not a candidate for revascularization at this point she is looking at basically more palliative care versus a above-knee amputation of this extremity. Again this is an unfortunate situation of being but again at 89 years old I discussed with the patient that I am not sure I would recommend going the extensive route of a amputation surgery especially in light of the fact that she never know if that is going to turn out to be as good is everybody is hoping as it stands anyway. She voiced understanding. With that being said for now she is  really not super interested in going with amputation but nonetheless is a little unsure of what exactly to do. She does I think have a very low chance of getting this to actually heal completely. 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 06-24-2022 upon evaluation today patient appears to be doing poorly still in regard to her leg ulcerations. Fortunately I do not see any signs of infection spreading systemically which is good news. However I do feel like that locally she may have some infection and notes she is good to be meeting virtually with Dr. Ilsa Iha tomorrow who is an infectious disease specialist. Subsequently also noted that her blood flow was not optimal so our goal right now is more of a palliative option as far as this is concerned were going to try to keep her wounds clean from being infected and hopefully keep her as healthy as possible for as long as possible. Objective Constitutional Chronically ill appearing but in no apparent acute distress. Vitals Time Taken: 1:51 PM, Pulse: 74  bpm, Respiratory Rate: 17 breaths/min, Blood Pressure: 124/76 mmHg. DAGNY, FIORENTINO R ([id number]) [id number] Page 6 of 7 Respiratory normal breathing without difficulty. Psychiatric this patient is able to make decisions and demonstrates good insight into disease process. Alert and Oriented x 3. pleasant and cooperative. General Notes: Upon inspection no debridement was undertaken today she has a significant amount of eschar again there is really not much we can do she does not have good blood flow and there really is not any way to improve this. For that reason truly I think that this is good to be more of a palliative management of her wounds at this point. I discussed this with the patient  as well as with her friend and nephew today. Subsequently the only other option would be an above-knee amputation which she wants to avoid at this time. Integumentary (Hair, Skin) Wound #2 status is Open. Original cause of wound was Pressure Injury. The date acquired was: 05/27/2022. The wound has been in treatment 2 weeks. The wound is located on the Right,Posterior Lower Leg. The wound measures 7.5cm length x 7cm width x 0.1cm depth; 41.233cm^2 area and 4.123cm^3 volume. There is a medium amount of serosanguineous drainage noted. There is no granulation within the wound bed. There is a large (67-100%) amount of necrotic tissue within the wound bed including Eschar and Adherent Slough. Wound #3 status is Open. Original cause of wound was Pressure Injury. The date acquired was: 05/27/2022. The wound has been in treatment 2 weeks. The wound is located on the Right Calcaneus. The wound measures 10cm length x 7cm width x 0.1cm depth; 54.978cm^2 area and 5.498cm^3 volume. There is a medium amount of serosanguineous drainage noted. There is no granulation within the wound bed. There is a large (67-100%) amount of necrotic tissue within the wound bed including Eschar and Adherent Slough. Wound #4 status is Open. Original cause of wound was Gradually Appeared. The date acquired was: 06/24/2022. The wound is located on the Right,Dorsal Foot. The wound measures 8cm length x 11cm width x 0.1cm depth; 69.115cm^2 area and 6.912cm^3 volume. Assessment Active Problems ICD-10 Other specified peripheral vascular diseases Non-pressure chronic ulcer of other part of right lower leg with other specified severity Pressure ulcer of right heel, unstageable Essential (primary) hypertension Chronic obstructive pulmonary disease, unspecified Muscle weakness (generalized) Paroxysmal atrial fibrillation Plan Follow-up Appointments: Return Appointment in 2 weeks. - Allen Derry, PA ***[place]*** Do not put right before lunch  or at the end of day*** Anesthetic: Wound #2 Right,Posterior Lower Leg: (In clinic) Topical Lidocaine 4% applied to wound bed Wound #3 Right Calcaneus: (In clinic) Topical Lidocaine 4% applied to wound bed Bathing/ Shower/ Hygiene: May shower and wash wound with soap and water. Off-Loading: Wound #2 Right,Posterior Lower Leg: Prevalon Boot - DO NOT USE ANY COMPRESSION TO LEGS Prevalon boots on at all times Wound #3 Right Calcaneus: Prevalon Boot WOUND #2: - Lower Leg Wound Laterality: Right, Posterior Topical: Betadine 1 x Per Day/30 Days Secondary Dressing: ABD Pad, 8x10 1 x Per Day/30 Days Discharge Instructions: Apply over primary dressing as directed. Secured With: [employer], 4.5x3.1 (in/yd) 1 x Per Day/30 Days Discharge Instructions: Secure with Kerlix as directed. Secured With: 54M Medipore H Soft Cloth Surgical T ape, 4 x 10 (in/yd) 1 x Per Day/30 Days Discharge Instructions: Secure with tape as directed. WOUND #3: - Calcaneus Wound Laterality: Right Topical: Betadine 1 x Per Day/30 Days Secondary Dressing: ABD Pad, 8x10 1 x Per Day/30 Days Discharge Instructions:  Apply over primary dressing as directed. Secured With: [employer], 4.5x3.1 (in/yd) 1 x Per Day/30 Days Discharge Instructions: Secure with Kerlix as directed. Secured With: 80M Medipore H Soft Cloth Surgical T ape, 4 x 10 (in/yd) 1 x Per Day/30 Days Discharge Instructions: Secure with tape as directed. WOUND #4: - Foot Wound Laterality: Dorsal, Right Topical: Betadine 1 x Per Day/30 Days Secondary Dressing: ABD Pad, 8x10 1 x Per Day/30 Days Discharge Instructions: Apply over primary dressing as directed. Secured With: [employer], 4.5x3.1 (in/yd) 1 x Per Day/30 Days Discharge Instructions: Secure with Kerlix as directed. Secured With: 80M Medipore H Soft Cloth Surgical T ape, 4 x 10 (in/yd) 1 x Per Day/30 Days Discharge Instructions: Secure with tape as directed. CLIFTON, KOVACIC R  ([id number]) [id number] Page 7 of 7 1. I am going to recommend that we have the patient continue to monitor for any signs of infection or worsening. Based on what I am seeing and I do think that the facility can continue to monitor this which is good to be good news. 2. I am going to recommend as well that the patient should continue to utilize the Betadine followed by ABD pads for padding. 3. She should be secured with rolled gauze but this needs to be lightly secured nothing too tight and I made that very clear on the orders going back to the facility. We will see patient back for reevaluation in 1 week here in the clinic. If anything worsens or changes patient will contact our office for additional recommendations. Electronic Signature(s) Signed: 06/24/2022 4:57:48 PM By: Allen Derry PA-C Entered By: Allen Derry on 06/24/2022 16:57:48 -------------------------------------------------------------------------------- SuperBill Details Patient Name: Date of Service: [name], [name] 06/24/2022 Medical Record Number: [id number] Patient Account Number: [id number] Date of Birth/Sex: Treating RN: October 31, 1922 (89 y.o. Ardis Rowan, [name] Primary Care Provider: Eloisa Northern Other Clinician: Referring Provider: Treating Provider/Extender: Gaynelle Arabian in Treatment: 2 Diagnosis Coding ICD-10 Codes Code Description [phone] Other specified peripheral vascular diseases L97.818 Non-pressure chronic ulcer of other part of right lower leg with other specified severity L89.610 Pressure ulcer of right heel, unstageable I10 Essential (primary) hypertension J44.9 Chronic obstructive pulmonary disease, unspecified M62.81 Muscle weakness (generalized) I48.0 Paroxysmal atrial fibrillation Facility Procedures : CPT4 Code: [id number] Description: 18841 - WOUND CARE VISIT-LEV 5 EST PT Modifier: Quantity: 1 Physician Procedures : CPT4 Code Description Modifier  [id number] - WC PHYS LEVEL 3 - EST PT ICD-10 Diagnosis Description I73.89 Other specified peripheral vascular diseases L97.818 Non-pressure chronic ulcer of other part of right lower leg with other specified severity  L89.610 Pressure ulcer of right heel, unstageable I10 Essential (primary) hypertension Quantity: 1 Electronic Signature(s) Signed: 06/24/2022 4:58:18 PM By: Allen Derry PA-C Previous Signature: 06/24/2022 4:00:00 PM Version By: Fonnie Mu RN Entered By: Allen Derry on 06/24/2022 16:58:18

## 2022-06-25 NOTE — Telephone Encounter (Signed)
Per Dr. Drue Second, extend doxycyline and Augmentin to 4 weeks from today (07/23/22). Spoke with nurse supervisor at [facility]) to make her aware of new orders. Rx should be sent to [facility]. Refill sent.   [place]: [phone]  Sandie Ano, RN

## 2022-06-29 NOTE — Progress Notes (Signed)
ALYANNAH, SANKS R ([id number]) [id number] Page 1 of 9 Visit Report for 06/24/2022 Arrival Information Details Patient Name: Date of Service: KARMAH, POTOCKI 06/24/2022 1:15 PM Medical Record Number: [id number] Patient Account Number: [id number] Date of Birth/Sex: Treating RN: [birth year] (89 y.o. Ardis Rowan, [name] Primary Care Jesstin Studstill: Eloisa Northern Other Clinician: Referring Rhena Glace: Treating Stephannie Broner/Extender: Gaynelle Arabian in Treatment: 2 Visit Information History Since Last Visit Added or deleted any medications: No Patient Arrived: Wheel Chair Any new allergies or adverse reactions: No Arrival Time: 13:51 Had a fall or experienced change in No Accompanied By: friends activities of daily living that may affect Transfer Assistance: Nurse, adult risk of falls: Patient Identification Verified: Yes Signs or symptoms of abuse/neglect since last visito No Secondary Verification Process Completed: Yes Hospitalized since last visit: No Patient Requires Transmission-Based Precautions: No Implantable device outside of the clinic excluding No Patient Has Alerts: Yes cellular tissue based products placed in the center Patient Alerts: Michiel Sites**** since last visit: Has Dressing in Place as Prescribed: Yes Pain Present Now: No Electronic Signature(s) Signed: 06/24/2022 4:00:00 PM By: Fonnie Mu RN Entered By: Fonnie Mu on 06/24/2022 13:51:42 -------------------------------------------------------------------------------- Clinic Level of Care Assessment Details Patient Name: Date of Service: ATIA, HAUPT 06/24/2022 1:15 PM Medical Record Number: [id number] Patient Account Number: [id number] Date of Birth/Sex: Treating RN: 03-Jul-1922 (89 y.o. Ardis Rowan, [name] Primary Care Damare Serano: Eloisa Northern Other Clinician: Referring Kazden [name]: Treating Freddrick Gladson/Extender: Gaynelle Arabian in Treatment: 2 Clinic  Level of Care Assessment Items TOOL 4 Quantity Score X- 1 0 Use when only an EandM is performed on FOLLOW-UP visit ASSESSMENTS - Nursing Assessment / Reassessment X- 1 10 Reassessment of Co-morbidities (includes updates in patient status) X- 1 5 Reassessment of Adherence to Treatment Plan ASSESSMENTS - Wound and Skin A ssessment / Reassessment []  - 0 Simple Wound Assessment / Reassessment - one wound X- 3 5 Complex Wound Assessment / Reassessment - multiple wounds []  - 0 Dermatologic / Skin Assessment (not related to wound area) ASSESSMENTS - Focused Assessment X- 1 5 Circumferential Edema Measurements - multi extremities []  - 0 Nutritional Assessment / Counseling / Intervention []  - 0 Lower Extremity Assessment (monofilament, tuning fork, pulses) []  - 0 Peripheral Arterial Disease Assessment (using hand held doppler) ASSESSMENTS - Ostomy and/or Continence Assessment and Care []  - 0 Incontinence Assessment and Management []  - 0 Ostomy Care Assessment and Management (repouching, etc.) PROCESS - Coordination of Care []  - 0 Simple Patient / Family Education for ongoing care CARREN, BLAKLEY R ([id number]) [id number] Page 2 of 9 X- 1 20 Complex (extensive) Patient / Family Education for ongoing care X- 1 10 Staff obtains Chiropractor, Records, T Results / Process Orders est []  - 0 Staff telephones HHA, Nursing Homes / Clarify orders / etc []  - 0 Routine Transfer to another Facility (non-emergent condition) []  - 0 Routine Hospital Admission (non-emergent condition) []  - 0 New Admissions / Manufacturing engineer / Ordering NPWT Apligraf, etc. , []  - 0 Emergency Hospital Admission (emergent condition) []  - 0 Simple Discharge Coordination X- 1 15 Complex (extensive) Discharge Coordination PROCESS - Special Needs []  - 0 Pediatric / Minor Patient Management []  - 0 Isolation Patient Management []  - 0 Hearing / Language / Visual special  needs []  - 0 Assessment of Community assistance (transportation, D/C planning, etc.) []  - 0 Additional assistance / Altered mentation []  - 0 Support Surface(s) Assessment (bed, cushion, seat, etc.) INTERVENTIONS - Wound Cleansing / Measurement []  -  0 Simple Wound Cleansing - one wound X- 3 5 Complex Wound Cleansing - multiple wounds X- 1 5 Wound Imaging (photographs - any number of wounds) []  - 0 Wound Tracing (instead of photographs) []  - 0 Simple Wound Measurement - one wound X- 3 5 Complex Wound Measurement - multiple wounds INTERVENTIONS - Wound Dressings []  - 0 Small Wound Dressing one or multiple wounds X- 3 15 Medium Wound Dressing one or multiple wounds []  - 0 Large Wound Dressing one or multiple wounds X- 1 5 Application of Medications - topical []  - 0 Application of Medications - injection INTERVENTIONS - Miscellaneous []  - 0 External ear exam []  - 0 Specimen Collection (cultures, biopsies, blood, body fluids, etc.) []  - 0 Specimen(s) / Culture(s) sent or taken to Lab for analysis X- 1 10 Patient Transfer (multiple staff / Nurse, adult / Similar devices) []  - 0 Simple Staple / Suture removal (25 or less) []  - 0 Complex Staple / Suture removal (26 or more) []  - 0 Hypo / Hyperglycemic Management (close monitor of Blood Glucose) []  - 0 Ankle / Brachial Index (ABI) - do not check if billed separately X- 1 5 Vital Signs Has the patient been seen at the hospital within the last three years: Yes Total Score: 180 Level Of Care: New/Established - Level 5 Electronic Signature(s) Signed: 06/24/2022 4:00:00 PM By: Fonnie Mu RN Entered By: Fonnie Mu on 06/24/2022 14:16:27 Acquanetta Belling, Giah R ([id number]) [id number] Page 3 of 9 -------------------------------------------------------------------------------- Encounter Discharge Information Details Patient Name: Date of Service: CONSANDRA, LASKE 06/24/2022 1:15 PM Medical  Record Number: [id number] Patient Account Number: [id number] Date of Birth/Sex: Treating RN: 1923-01-06 (89 y.o. Ardis Rowan, [name] Primary Care Evora Schechter: Eloisa Northern Other Clinician: Referring Keishaun Hazel: Treating Aqil Goetting/Extender: Gaynelle Arabian in Treatment: 2 Encounter Discharge Information Items Discharge Condition: Stable Ambulatory Status: Wheelchair Discharge Destination: Skilled Nursing Facility Telephoned: No Orders Sent: Yes Transportation: Private Auto Accompanied By: family Schedule Follow-up Appointment: Yes Clinical Summary of Care: Patient Declined Electronic Signature(s) Signed: 06/24/2022 4:00:00 PM By: Fonnie Mu RN Entered By: Fonnie Mu on 06/24/2022 14:17:06 -------------------------------------------------------------------------------- Lower Extremity Assessment Details Patient Name: Date of Service: Illene Bolus 06/24/2022 1:15 PM Medical Record Number: [id number] Patient Account Number: [id number] Date of Birth/Sex: Treating RN: 09/26/1922 (89 y.o. Ardis Rowan, [name] Primary Care Zamaya Rapaport: Eloisa Northern Other Clinician: Referring Sabine Tenenbaum: Treating Laith Antonelli/Extender: Bess Kinds Weeks in Treatment: 2 Edema Assessment Assessed: [Left: No] [Right: Yes] [Left: Edema] [Right: :] Calf Left: Right: Point of Measurement: From Medial Instep 40 cm Ankle Left: Right: Point of Measurement: From Medial Instep 24.5 cm Vascular Assessment Pulses: Dorsalis Pedis Palpable: [Right:Yes] Posterior Tibial Palpable: [Right:Yes] Electronic Signature(s) Signed: 06/24/2022 4:00:00 PM By: Fonnie Mu RN Entered By: Fonnie Mu on 06/24/2022 14:08:30 -------------------------------------------------------------------------------- Multi-Disciplinary Care Plan Details Patient Name: Date of Service: [name], [name] 06/24/2022 1:15 PM Medical Record Number: [id number] Patient Account Number:  [id number] TYNASIA, MCCAUL ([id number]) [id number] Page 4 of 9 Date of Birth/Sex: Treating RN: 1922/03/16 (89 y.o. Ardis Rowan, [name] Primary Care Maeby Vankleeck: Other Clinician: Eloisa Northern Referring Ravon Mortellaro: Treating Raiden Yearwood/Extender: Gaynelle Arabian in Treatment: 2 Active Inactive Pressure Nursing Diagnoses: Knowledge deficit related to causes and risk factors for pressure ulcer development Knowledge deficit related to management of pressures ulcers Potential for impaired tissue integrity related to pressure, friction, moisture, and shear Goals: Patient will remain free from development of additional pressure ulcers Date Initiated: 06/10/2022 Target Resolution Date:  07/14/2022 Goal Status: Active Patient will remain free of pressure ulcers Date Initiated: 06/10/2022 Target Resolution Date: 07/29/2022 Goal Status: Active Patient/caregiver will verbalize risk factors for pressure ulcer development Date Initiated: 06/10/2022 Target Resolution Date: 07/23/2022 Goal Status: Active Patient/caregiver will verbalize understanding of pressure ulcer management Date Initiated: 06/10/2022 Target Resolution Date: 07/14/2022 Goal Status: Active Interventions: Assess: immobility, friction, shearing, incontinence upon admission and as needed Assess offloading mechanisms upon admission and as needed Assess potential for pressure ulcer upon admission and as needed Provide education on pressure ulcers Notes: Wound/Skin Impairment Nursing Diagnoses: Impaired tissue integrity Knowledge deficit related to ulceration/compromised skin integrity Goals: Patient/caregiver will verbalize understanding of skin care regimen Date Initiated: 06/10/2022 Target Resolution Date: 07/31/2022 Goal Status: Active Ulcer/skin breakdown will have a volume reduction of 30% by week 4 Date Initiated: 06/10/2022 Target Resolution Date: 07/14/2022 Goal Status:  Active Interventions: Assess patient/caregiver ability to obtain necessary supplies Assess patient/caregiver ability to perform ulcer/skin care regimen upon admission and as needed Assess ulceration(s) every visit Provide education on ulcer and skin care Notes: Electronic Signature(s) Signed: 06/24/2022 4:00:00 PM By: Fonnie Mu RN Entered By: Fonnie Mu on 06/24/2022 14:15:14 -------------------------------------------------------------------------------- Pain Assessment Details Patient Name: Date of Service: Ginny Forth R. 06/24/2022 1:15 PM Medical Record Number: [id number] Patient Account Number: [id number] Date of Birth/Sex: Treating RN: 10-07-1922 (89 y.o. Ardis Rowan, [name] Primary Care Geovani Tootle: Eloisa Northern Other Clinician: Referring Elicia Lui: Treating Shadiyah Wernli/Extender: Daine Gip, Criss Rosales in Treatment: 2 [place], [place] R ([id number]) [id number] Page 5 of 9 Active Problems Location of Pain Severity and Description of Pain Patient Has Paino No Site Locations Pain Management and Medication Current Pain Management: Electronic Signature(s) Signed: 06/24/2022 4:00:00 PM By: Fonnie Mu RN Entered By: Fonnie Mu on 06/24/2022 13:51:49 -------------------------------------------------------------------------------- Patient/Caregiver Education Details Patient Name: Date of Service: Illene Bolus 4/24/2024andnbsp1:15 PM Medical Record Number: [id number] Patient Account Number: [id number] Date of Birth/Gender: Treating RN: February 18, 1923 (89 y.o. Toniann Fail Primary Care Physician: Eloisa Northern Other Clinician: Referring Physician: Treating Physician/Extender: Gaynelle Arabian in Treatment: 2 Education Assessment Education Provided To: Patient Education Topics Provided Wound/Skin Impairment: Methods: Explain/Verbal Responses: Reinforcements needed, State content  correctly Electronic Signature(s) Signed: 06/24/2022 4:00:00 PM By: Fonnie Mu RN Entered By: Fonnie Mu on 06/24/2022 14:15:31 -------------------------------------------------------------------------------- Wound Assessment Details Patient Name: Date of Service: Ginny Forth R. 06/24/2022 1:15 PM Medical Record Number: [id number] Patient Account Number: [id number] Date of Birth/Sex: Treating RN: 1922-09-09 (89 y.o. Ardis Rowan, [name] Primary Care Abir Craine: Eloisa Northern Other Clinician: Referring Concha Sudol: Treating Sherrica Niehaus/Extender: Daine Gip, [name] Weeks in Treatment: 2 Wound Status JAELYNN, POZO R ([id number]) [id number] Page 6 of 9 Wound Number: 2 Primary Pressure Ulcer Etiology: Wound Location: Right, Posterior Lower Leg Wound Open Wounding Event: Pressure Injury Status: Date Acquired: 05/27/2022 Comorbid Cataracts, Chronic Obstructive Pulmonary Disease (COPD), Weeks Of Treatment: 2 History: Arrhythmia, Hypertension, Osteoarthritis, Received Radiation Clustered Wound: No Wound Measurements Length: (cm) 7.5 Width: (cm) 7 Depth: (cm) 0.1 Area: (cm) 41.233 Volume: (cm) 4.123 % Reduction in Area: -43% % Reduction in Volume: -43% Epithelialization: None Wound Description Classification: Unstageable/Unclassified Exudate Amount: Medium Exudate Type: Serosanguineous Exudate Color: red, brown Foul Odor After Cleansing: No Slough/Fibrino Yes Wound Bed Granulation Amount: None Present (0%) Necrotic Amount: Large (67-100%) Necrotic Quality: Eschar, Adherent Slough Periwound Skin Texture Texture Color No Abnormalities Noted: No No Abnormalities Noted: No Moisture No Abnormalities Noted: No Treatment Notes Wound #2 (Lower Leg) Wound Laterality: Right, Posterior Cleanser Peri-Wound Care  Topical Betadine Primary Dressing Secondary Dressing ABD Pad, 8x10 Discharge Instruction: Apply over primary dressing as  directed. Secured With [employer], 4.5x3.1 (in/yd) Discharge Instruction: Secure with Kerlix as directed. 28M Medipore H Soft Cloth Surgical T ape, 4 x 10 (in/yd) Discharge Instruction: Secure with tape as directed. Compression Wrap Compression Stockings Add-Ons Electronic Signature(s) Signed: 06/24/2022 4:00:00 PM By: Fonnie Mu RN Signed: 06/29/2022 10:05:50 AM By: Karl Ito Entered By: Karl Ito on 06/24/2022 13:59:49 -------------------------------------------------------------------------------- Wound Assessment Details Patient Name: Date of Service: [name], [name] 06/24/2022 1:15 PM Medical Record Number: [id number] Patient Account Number: [id number] Date of Birth/Sex: Treating RN: 03/20/1922 (89 y.o. Ardis Rowan, [name] Primary Care Tashyra Adduci: Eloisa Northern Other Clinician: Referring Jamarques Pinedo: Treating Hobie Kohles/Extender: Bess Kinds [place], [place] R ([id number]) [id number] Page 7 of 9 Weeks in Treatment: 2 Wound Status Wound Number: 3 Primary Pressure Ulcer Etiology: Wound Location: Right Calcaneus Wound Open Wounding Event: Pressure Injury Status: Date Acquired: 05/27/2022 Comorbid Cataracts, Chronic Obstructive Pulmonary Disease (COPD), Weeks Of Treatment: 2 History: Arrhythmia, Hypertension, Osteoarthritis, Received Radiation Clustered Wound: No Photos Wound Measurements Length: (cm) 10 Width: (cm) 7 Depth: (cm) 0.1 Area: (cm) 54.978 Volume: (cm) 5.498 % Reduction in Area: 6.7% % Reduction in Volume: 6.7% Epithelialization: None Wound Description Classification: Unstageable/Unclassified Exudate Amount: Medium Exudate Type: Serosanguineous Exudate Color: red, brown Foul Odor After Cleansing: No Slough/Fibrino Yes Wound Bed Granulation Amount: None Present (0%) Necrotic Amount: Large (67-100%) Necrotic Quality: Eschar, Adherent Slough Periwound Skin Texture Texture Color No  Abnormalities Noted: No No Abnormalities Noted: No Moisture No Abnormalities Noted: No Treatment Notes Wound #3 (Calcaneus) Wound Laterality: Right Cleanser Peri-Wound Care Topical Betadine Primary Dressing Secondary Dressing ABD Pad, 8x10 Discharge Instruction: Apply over primary dressing as directed. Secured With [employer], 4.5x3.1 (in/yd) Discharge Instruction: Secure with Kerlix as directed. 28M Medipore H Soft Cloth Surgical T ape, 4 x 10 (in/yd) Discharge Instruction: Secure with tape as directed. Compression Wrap Compression Stockings STARLETT, PEHRSON R ([id number]) [id number] Page 8 of 9 Add-Ons Electronic Signature(s) Signed: 06/24/2022 4:00:00 PM By: Fonnie Mu RN Signed: 06/29/2022 10:05:50 AM By: Karl Ito Entered By: Karl Ito on 06/24/2022 14:00:17 -------------------------------------------------------------------------------- Wound Assessment Details Patient Name: Date of Service: [name], [name] 06/24/2022 1:15 PM Medical Record Number: [id number] Patient Account Number: [id number] Date of Birth/Sex: Treating RN: 1922/12/14 (89 y.o. Ardis Rowan, [name] Primary Care Dereka Lueras: Eloisa Northern Other Clinician: Referring Lateya Dauria: Treating Briahna Pescador/Extender: Bess Kinds Weeks in Treatment: 2 Wound Status Wound Number: 4 Primary Pressure Ulcer Etiology: Wound Location: Right, Dorsal Foot Wound Open Wounding Event: Gradually Appeared Status: Date Acquired: 06/24/2022 Comorbid Cataracts, Chronic Obstructive Pulmonary Disease (COPD), Weeks Of Treatment: 0 History: Arrhythmia, Hypertension, Osteoarthritis, Received Radiation Clustered Wound: No Photos Wound Measurements Length: (cm) Width: (cm) Depth: (cm) Area: (cm) Volume: (cm) 8 % Reduction in Area: 11 % Reduction in Volume: 0.1 69.115 6.912 Wound Description Classification: Category/Stage I Periwound Skin Texture Texture  Color No Abnormalities Noted: No No Abnormalities Noted: No Moisture No Abnormalities Noted: No Treatment Notes Wound #4 (Foot) Wound Laterality: Dorsal, Right Cleanser Peri-Wound Care Topical Betadine Primary Dressing Secondary Dressing ABD Pad, 8x10 Discharge Instruction: Apply over primary dressing as directed. KAIRAH, LEONI R ([id number]) [id number] Page 9 of 9 Secured With [employer], 4.5x3.1 (in/yd) Discharge Instruction: Secure with Kerlix as directed. 28M Medipore H Soft Cloth Surgical T ape, 4 x 10 (in/yd) Discharge Instruction: Secure with tape as directed. Compression Wrap Compression Stockings Add-Ons Electronic Signature(s) Signed:  06/24/2022 4:00:00 PM By: Fonnie Mu RN Signed: 06/29/2022 10:05:50 AM By: Karl Ito Entered By: Karl Ito on 06/24/2022 14:01:04 -------------------------------------------------------------------------------- Vitals Details Patient Name: Date of Service: [name], [name] 06/24/2022 1:15 PM Medical Record Number: [id number] Patient Account Number: [id number] Date of Birth/Sex: Treating RN: 08-15-1922 (89 y.o. Ardis Rowan, [name] Primary Care Dontrel Smethers: Eloisa Northern Other Clinician: Referring Shearon Clonch: Treating Khaiden Segreto/Extender: Bess Kinds Weeks in Treatment: 2 Vital Signs Time Taken: 13:51 Pulse (bpm): 74 Respiratory Rate (breaths/min): 17 Blood Pressure (mmHg): 124/76 Reference Range: 80 - 120 mg / dl Electronic Signature(s) Signed: 06/24/2022 4:00:00 PM By: Fonnie Mu RN Entered By: Fonnie Mu on 06/24/2022 13:52:45

## 2022-06-30 NOTE — Progress Notes (Signed)
Virtual Visit via Video Note  I connected with [name] on 06/25/2022 at  3:00 PM EDT by a video enabled telemedicine application and verified that I am speaking with the correct person using two identifiers.  Location: Patient: at skilled facility Provider: in clinic   I discussed the limitations of evaluation and management by telemedicine and the availability of in person appointments. The patient expressed understanding and agreed to proceed.  History of Present Illness: 89yo F with history of right heel nonhealing ulcer but subsequent ulceration on dorsal aspect of foot from tight wrappings. Does follow at wound care center, seen the day prior. Currently on oral abtx which she is tolerating. Amox/clav and doxy. Family advocates [name]) concern that wound is not improving and slightly worse. Stalled overall.   Has seen vascular surgery last month, who felt non-surgical candidate, does have vascular disease, if worsening wounds, then aka maybe an option  Patient at this time not interested in surgical intervention and they are wondering if any other referrals can be made for 2nd opinion on wound management. They have heard of good outcomes with dr duda.    Observations/Objective: Has still ongoing right heel eschar. Unchanged from prior visit. Concern for new lesions to dorsum of foot, volatious appearance.  Assessment and Plan: Will extend amox/clav and doxy for 2 additional weeks. Would like to have another video appt for deep tissue infection/early osteo  Refer to dr duda for input.  Follow Up Instructions:    I discussed the assessment and treatment plan with the patient. The patient was provided an opportunity to ask questions and all were answered. The patient agreed with the plan and demonstrated an understanding of the instructions.   The patient was advised to call back or seek an in-person evaluation if the symptoms worsen or if the condition fails to improve as  anticipated.  I provided 20 minutes of  patient facing and non-face-to-face time during this encounter.   [name] Munson, MD

## 2022-07-02 DIAGNOSIS — L97511 Non-pressure chronic ulcer of other part of right foot limited to breakdown of skin: Secondary | ICD-10-CM | POA: Diagnosis not present

## 2022-07-02 DIAGNOSIS — L97311 Non-pressure chronic ulcer of right ankle limited to breakdown of skin: Secondary | ICD-10-CM | POA: Diagnosis not present

## 2022-07-02 DIAGNOSIS — L89896 Pressure-induced deep tissue damage of other site: Secondary | ICD-10-CM | POA: Diagnosis not present

## 2022-07-02 DIAGNOSIS — L89610 Pressure ulcer of right heel, unstageable: Secondary | ICD-10-CM | POA: Diagnosis not present

## 2022-07-07 ENCOUNTER — Ambulatory Visit (INDEPENDENT_AMBULATORY_CARE_PROVIDER_SITE_OTHER): Payer: Medicare PPO | Admitting: Orthopedic Surgery

## 2022-07-07 DIAGNOSIS — I96 Gangrene, not elsewhere classified: Secondary | ICD-10-CM | POA: Diagnosis not present

## 2022-07-08 ENCOUNTER — Encounter (HOSPITAL_BASED_OUTPATIENT_CLINIC_OR_DEPARTMENT_OTHER): Payer: Medicare PPO | Admitting: Physician Assistant

## 2022-07-16 DIAGNOSIS — L89610 Pressure ulcer of right heel, unstageable: Secondary | ICD-10-CM | POA: Diagnosis not present

## 2022-07-16 DIAGNOSIS — L97811 Non-pressure chronic ulcer of other part of right lower leg limited to breakdown of skin: Secondary | ICD-10-CM | POA: Diagnosis not present

## 2022-07-16 DIAGNOSIS — L97311 Non-pressure chronic ulcer of right ankle limited to breakdown of skin: Secondary | ICD-10-CM | POA: Diagnosis not present

## 2022-07-16 DIAGNOSIS — L89896 Pressure-induced deep tissue damage of other site: Secondary | ICD-10-CM | POA: Diagnosis not present

## 2022-07-21 ENCOUNTER — Encounter: Payer: Self-pay | Admitting: Orthopedic Surgery

## 2022-07-21 NOTE — Progress Notes (Signed)
Office Visit Note   Patient: [name]           Date of Birth: 07-Nov-1922           MRN: [id number] Visit Date: 07/07/2022              Requested by: Eloisa Northern, MD [address] [place],  [place] [address] PCP: Eloisa Northern, MD  Chief Complaint  Patient presents with   Right Foot - Wound Check      HPI: Patient is a 89 year old woman who is seen for initial evaluation for ischemic changes right lower extremity.  Patient is currently on Augmentin and doxycycline at skilled nursing.  Patient has seen vascular surgery and she is not a surgical candidate.  Patient is currently going to the wound center.  MRI of the right foot April 2.  Assessment & Plan: Visit Diagnoses:  1. Gangrene of right foot (HCC)     Plan: Recommended continued conservative wound care.  She may continue with the Betadine dressing changes with elevation.  Discussed that her only surgical option would be an above-the-knee amputation.  Follow-Up Instructions: No follow-ups on file.   Ortho Exam  Patient is alert, oriented, no adenopathy, well-dressed, normal affect, normal respiratory effort. Examination MRI scan shows no definite osteomyelitis.  No deep abscess.  Patient has dry gangrene over the dorsum of the ankle and extensive gangrene to the right heel and the entire right Achilles.  There is no purulent drainage.  She has equinus contracture of the ankle that is fixed Hoyer lift for transfers.  Imaging: No results found.     Labs: Lab Results  Component Value Date   HGBA1C 5.9 (H) 05/25/2015   ESRSEDRATE >140 (H) 06/03/2022   CRP 8.6 (H) 06/03/2022   REPTSTATUS 06/02/2022 FINAL 05/28/2022   REPTSTATUS 06/02/2022 FINAL 05/28/2022   CULT  05/28/2022    NO GROWTH 5 DAYS Performed at [facility], [address] [place], [place] [address]    CULT  05/28/2022    NO GROWTH 5 DAYS Performed at [facility], [address] [place], [place] [address]      Lab Results   Component Value Date   ALBUMIN 2.0 (L) 06/05/2022   ALBUMIN 1.9 (L) 06/04/2022   ALBUMIN 2.7 (L) 08/07/2021    Lab Results  Component Value Date   MG 1.9 06/05/2022   MG 1.9 06/04/2022   MG 2.2 08/09/2021   No results found for: "VD25OH"  No results found for: "PREALBUMIN"    Latest Ref Rng & Units 06/05/2022   12:58 AM 06/04/2022    5:30 AM 05/31/2022    6:08 AM  CBC EXTENDED  WBC 4.0 - 10.5 K/uL 11.0  10.1  11.6   RBC 3.87 - 5.11 MIL/uL 3.61  3.58  3.46   Hemoglobin 12.0 - 15.0 g/dL 21.3  08.6  57.8   HCT 36.0 - 46.0 % 33.5  32.7  31.3   Platelets 150 - 400 K/uL 359  341  303   NEUT# 1.7 - 7.7 K/uL 7.1  6.6    Lymph# 0.7 - 4.0 K/uL 1.8  1.4       There is no height or weight on file to calculate BMI.  Orders:  No orders of the defined types were placed in this encounter.  No orders of the defined types were placed in this encounter.    Procedures: No procedures performed  Clinical Data: No  additional findings.  ROS:  All other systems negative, except as noted in the HPI. Review of Systems  Objective: Vital Signs: There were no vitals taken for this visit.  Specialty Comments:  No specialty comments available.  PMFS History: Patient Active Problem List   Diagnosis Date Noted   Pyogenic inflammation of bone (HCC) 06/04/2022   PAD (peripheral artery disease) (HCC) 06/04/2022   Pressure injury of right leg, unstageable (HCC) 05/28/2022   Bedbound 05/28/2022   GERD (gastroesophageal reflux disease) 05/28/2022   Closed fracture of right distal femur (HCC) 08/07/2021   Periprosthetic fracture around internal prosthetic right knee joint 08/06/2021   Spinal stenosis of thoracolumbar region 11/02/2017   Right shoulder pain 05/24/2015   Primary osteoarthritis of left knee 05/24/2015   Encounter for therapeutic drug monitoring 09/15/2013   Hip pain 05/02/2012   Fall 05/02/2012   Long term (current) use of anticoagulants 08/08/2010   DEGENERATIVE JOINT  DISEASE 08/06/2008   Malignant neoplasm of female breast (HCC) 08/03/2008   Essential hypertension 08/03/2008   Atrial fibrillation (HCC) 08/03/2008   Diastolic heart failure (HCC) 08/03/2008   Edema 08/03/2008   Past Medical History:  Diagnosis Date   Atrial fibrillation (HCC)    CARCINOMA, BREAST    CHEST PAIN    DEGENERATIVE JOINT DISEASE    Diastolic heart failure (HCC) 08/03/2008   Qualifier: Diagnosis of  By: Kem Parkinson     Difficult intubation    Edema    GIB (gastrointestinal bleeding) 05/24/2015   Humerus fracture 09/12/2020   HYPERTENSION    Long term (current) use of anticoagulants    Malignant neoplasm of female breast (HCC) 08/03/2008   Qualifier: Diagnosis of  By: Kem Parkinson     Unspecified diastolic heart failure     Family History  Problem Relation Age of Onset   CAD Mother    Heart attack Mother    Hypertension Mother    CAD Father    Stroke Brother    Heart attack Brother     Past Surgical History:  Procedure Laterality Date   BREAST LUMPECTOMY     ESOPHAGOGASTRODUODENOSCOPY (EGD) WITH PROPOFOL Left 05/28/2015   Procedure: ESOPHAGOGASTRODUODENOSCOPY (EGD) WITH PROPOFOL;  Surgeon: Charlott Rakes, MD;  Location: [facility] ENDOSCOPY;  Service: Endoscopy;  Laterality: Left;   HIP SURGERY     KNEE SURGERY     LAMINECTOMY     ORIF FEMUR FRACTURE Right 08/08/2021   Procedure: OPEN REDUCTION INTERNAL FIXATION (ORIF) DISTAL FEMUR FRACTURE;  Surgeon: Joen Laura, MD;  Location: [facility] ORS;  Service: Orthopedics;  Laterality: Right;   TONSILLECTOMY     Social History   Occupational History   Not on file  Tobacco Use   Smoking status: Never   Smokeless tobacco: Never  Vaping Use   Vaping Use: Never used  Substance and Sexual Activity   Alcohol use: No   Drug use: No   Sexual activity: Not on file

## 2022-07-22 ENCOUNTER — Encounter (HOSPITAL_BASED_OUTPATIENT_CLINIC_OR_DEPARTMENT_OTHER): Payer: Medicare PPO | Admitting: Physician Assistant

## 2022-07-22 DIAGNOSIS — L97511 Non-pressure chronic ulcer of other part of right foot limited to breakdown of skin: Secondary | ICD-10-CM | POA: Diagnosis not present

## 2022-07-22 DIAGNOSIS — L89896 Pressure-induced deep tissue damage of other site: Secondary | ICD-10-CM | POA: Diagnosis not present

## 2022-07-22 DIAGNOSIS — L97311 Non-pressure chronic ulcer of right ankle limited to breakdown of skin: Secondary | ICD-10-CM | POA: Diagnosis not present

## 2022-07-22 DIAGNOSIS — L89610 Pressure ulcer of right heel, unstageable: Secondary | ICD-10-CM | POA: Diagnosis not present

## 2022-07-29 ENCOUNTER — Encounter (HOSPITAL_BASED_OUTPATIENT_CLINIC_OR_DEPARTMENT_OTHER): Payer: Medicare PPO | Attending: Physician Assistant | Admitting: Physician Assistant

## 2022-07-29 DIAGNOSIS — L89890 Pressure ulcer of other site, unstageable: Secondary | ICD-10-CM | POA: Diagnosis not present

## 2022-07-29 DIAGNOSIS — M6281 Muscle weakness (generalized): Secondary | ICD-10-CM | POA: Diagnosis not present

## 2022-07-29 DIAGNOSIS — L97818 Non-pressure chronic ulcer of other part of right lower leg with other specified severity: Secondary | ICD-10-CM | POA: Insufficient documentation

## 2022-07-29 DIAGNOSIS — L89610 Pressure ulcer of right heel, unstageable: Secondary | ICD-10-CM | POA: Diagnosis not present

## 2022-07-29 DIAGNOSIS — I480 Paroxysmal atrial fibrillation: Secondary | ICD-10-CM | POA: Insufficient documentation

## 2022-07-29 DIAGNOSIS — J449 Chronic obstructive pulmonary disease, unspecified: Secondary | ICD-10-CM | POA: Diagnosis not present

## 2022-07-29 DIAGNOSIS — I7389 Other specified peripheral vascular diseases: Secondary | ICD-10-CM | POA: Diagnosis not present

## 2022-07-29 DIAGNOSIS — I10 Essential (primary) hypertension: Secondary | ICD-10-CM | POA: Diagnosis not present

## 2022-07-29 DIAGNOSIS — Z7401 Bed confinement status: Secondary | ICD-10-CM | POA: Diagnosis not present

## 2022-07-30 NOTE — Progress Notes (Addendum)
GELENE, POSTLE R ([id number]) [id number] Page 1 of 7 Visit Report for 07/29/2022 Chief Complaint Document Details Patient Name: Date of Service: [name], [name] 07/29/2022 2:45 PM Medical Record Number: [id number] Patient Account Number: [id number] Date of Birth/Sex: Treating RN: 1923-01-01 (89 y.o. F) Primary Care Provider: Eloisa Northern Other Clinician: Referring Provider: Treating Provider/Extender: Gaynelle Arabian in Treatment: 7 Information Obtained from: Patient Chief Complaint Right LE Ulcers Electronic Signature(s) Signed: 07/29/2022 3:07:04 PM By: Allen Derry PA-C Entered By: Allen Derry on 07/29/2022 15:07:04 -------------------------------------------------------------------------------- HPI Details Patient Name: Date of Service: [name] Forth R. 07/29/2022 2:45 PM Medical Record Number: [id number] Patient Account Number: [id number] Date of Birth/Sex: Treating RN: 07-08-1922 (89 y.o. F) Primary Care Provider: Eloisa Northern Other Clinician: Referring Provider: Treating Provider/Extender: Gaynelle Arabian in Treatment: 7 History of Present Illness HPI Description: 01/08/2021 patient presents today for evaluation of a pressure ulcer which began after she fell in July and broke her arm. This unfortunately has led to her being overall more generally weak and she is not really up and moving around much at all. While she was in the hospital she developed the heel ulcers bilaterally the right was more deep tissue injury that pretty much appears to be healed at this point. At the current skilled nursing facility they have actually been doing a great job taking care of her according to her friend who also helps take care of her. Subsequently the patient's wound on the left heel is eschar covered and probably would benefit from clearing away this eschar so that she can actually see some improvements hopefully. Currently they  have been utilizing Iodosorb along with a border foam dressing. Patient has a history of hypertension, COPD, generalized muscle weakness, and atrial fibrillation. Due to her age they elected not to place her on any blood thinners for the atrial fibrillation. 01/15/2021 upon evaluation today patient appears to be doing better in regard to the heel ulcer. I do not have initially upon evaluation today her arterial studies that were done at the facility. Therefore I did not perform any sharp debridement while she was present during the office visit today. Nonetheless I am going to suggest based on what was seen currently that we probably once we get this with you at everything appears to be okay consider stop debridement at the next visit. 01/29/2021 upon evaluation today patient appears to be doing well with regard to her wound all things considered her blood flow is definitely not very good based on the review of the arterial study although it was really incomplete in my opinion there was not even a brachial reading to compare to but nonetheless the readings in the extremities were very poor. In the end I did actually discuss with her going ahead and going to vascular and they have already get that appointment set up which is great news. Fortunately there does not appear to be signs of infection currently. The wound is really not making tremendous progress but again I would not expect it to do so with where things stand currently until we can get this moving a little bit better direction. 02/19/2021 upon evaluation today patient's wound actually showing signs of doing about the same the still very thick region of eschar noted currently. I do think that based on the fact that there really is nothing from a vascular standpoint recommend currently which I completely understand that this is still something that is going to require  as aggressive as we can wound care obviously I am not going to perform any  debridement but nonetheless I do think that good wound care including Santyl can be beneficial for the patient currently. 03/12/2021 upon evaluation today patient appears to be doing well with regard to her wound. Fortunately there does not appear to be any signs of active infection at this time which is great news. No fevers, chills, nausea, vomiting, or diarrhea. I do feel like the Melburn Popper is doing a good job although we need to make sure that they are put in a saline moistened gauze and behind to make sure this stays nice and moist it seems to be a little bit dry. 04/02/2021 upon evaluation today patient's wound is actually showing signs of good improvement. Fortunately there does not appear to be any evidence of active infection locally nor systemically at this time which is great news. No fevers, chills, nausea, vomiting, or diarrhea. 04/23/2021 upon evaluation today patient's wound is actually showing signs of being a little bit smaller which is good news and I think that we are headed in the right direction. Obviously this is something that is going to continue to slowly progress I believe and again as long as we keep it clean and keep it from getting infected that is going to be the big thing. 05/14/2021: Today, the wound has contracted even more. There continues to be loose but somewhat thick yellow slough in the wound base. The wound is moist. 06-04-2021 upon evaluation today patient appears to be doing well currently in regard to her heel ulcer. I am actually extremely pleased with where we stand and I think the patient is making good progress here. There does not appear to be any signs of active infection locally or systemically which is great news. SHARAIN, WOTTON R ([id number]) [id number] Page 2 of 7 06-25-2021 upon evaluation today patient's wound is actually showing signs of excellent improvement. I am actually very pleased with where we stand and I think the  patient is making excellent progress. There does not appear to be any signs of active infection at this time. No fevers, chills, nausea, vomiting, or diarrhea. 07-23-2021 upon evaluation today patient's wound actually appears to be doing quite well. Fortunately there does not appear to be any evidence of active infection locally nor systemically which is great news. No fevers, chills, nausea, vomiting, or diarrhea. Readmission: 06-10-2022 upon evaluation today patient presents for initial inspection here in our clinic concerning issues that she has been having with wounds over the right lower extremity. This is part of the leg as well as the ankle region unfortunately there is significant necrosis noted in the way of eschar. Actually did perform a fairly extensive record review through epic today. With that being said unfortunately the patient continues to have a lot of issues here with her arterial flow I did review Dr. [name] note and there does not appear to be any simple way to fix this blood flow. In fact he states that she is not a candidate for revascularization at this point she is looking at basically more palliative care versus a above-knee amputation of this extremity. Again this is an unfortunate situation of being but again at 89 years old I discussed with the patient that I am not sure I would recommend going the extensive route of a amputation surgery especially in light of the fact that she never know if that is going to turn out to be as good is  everybody is hoping as it stands anyway. She voiced understanding. With that being said for now she is really not super interested in going with amputation but nonetheless is a little unsure of what exactly to do. She does I think have a very low chance of getting this to actually heal completely. 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 06-24-2022 upon evaluation today patient appears to be doing poorly still in regard to her leg ulcerations. Fortunately I do not see any signs of infection spreading systemically which is good news. However I do feel like that locally she may have some infection and notes she is good to be meeting virtually with Dr. Ilsa Iha tomorrow who is an infectious disease specialist. Subsequently also noted that her blood flow was not optimal so our goal right now is more of a palliative option as far as this is concerned were going to try to keep her wounds clean from being infected and hopefully keep her as healthy as possible for as long as possible. 07-29-2022 upon evaluation today patient appears to be doing about the same in regards to the wounds on her leg, ankle, and foot region. This is on the right side. With that being said she did see Dr. Lajoyce Corners to and it was recommended that conservative measures are really the best way to go at this point and he suggested that she continue to come to the wound care center. As far as any other options his only other option would be the potential for an above-knee amputation which I really do not think is the ideal way to go. Electronic Signature(s) Signed: 07/29/2022 4:08:23 PM By: Allen Derry PA-C Entered By: Allen Derry on 07/29/2022 16:08:23 -------------------------------------------------------------------------------- Physical Exam Details Patient Name: Date of Service: GAE, HASENAUER 07/29/2022 2:45 PM Medical Record Number: [id number] Patient Account Number: [id number] Date of Birth/Sex: Treating RN: 03-Sep-1922 (89 y.o. F) Primary Care Provider: Eloisa Northern Other Clinician: Referring Provider: Treating Provider/Extender: Gaynelle Arabian in Treatment: 7 Constitutional Well-nourished and well-hydrated in no acute distress. Respiratory normal breathing  without difficulty. Psychiatric this patient is able to make decisions and demonstrates good insight into disease process. Alert and Oriented x 3. pleasant and cooperative. Notes Upon inspection patient did have mainly what appeared to be dry eschar I think the Betadine is currently doing well I think Betadine with a dry gauze is good to be the best way to continue at this point and I am overall very pleased with where things stand currently. Electronic Signature(s) Signed: 07/29/2022 4:08:40 PM By: Allen Derry PA-C Entered By: Allen Derry on 07/29/2022 16:08:40 -------------------------------------------------------------------------------- Physician Orders Details Patient Name: Date of Service: [name] Forth R. 07/29/2022 2:45 PM Medical Record Number: [id number] Patient Account Number: [id number] Date of Birth/Sex: Treating RN: [birth year] (89 y.o. F) Primary Care Provider: Eloisa Northern Other Clinician: Referring Provider: Treating Provider/Extender: Gaynelle Arabian in Treatment: 7 Verbal / Phone Orders: No DOSHIA, DILLEHAY R ([id number]) [id number] Page 3 of 7 Diagnosis Coding ICD-10 Coding Code Description I73.89 Other specified peripheral vascular diseases L97.818 Non-pressure chronic ulcer of other part of right lower leg with other specified severity L89.610 Pressure ulcer of right heel, unstageable I10 Essential (primary) hypertension J44.9 Chronic obstructive pulmonary disease, unspecified M62.81 Muscle weakness (generalized) I48.0 Paroxysmal atrial fibrillation Follow-up Appointments ppointment in  2 weeks. Allen Derry, PA Return A ***Hoyer*** Do not put right before lunch or at the end of day*** Bathing/ Shower/ Hygiene May shower and wash wound with soap and water. Off-Loading Prevalon Boot - DO NOT USE ANY COMPRESSION TO LEGS Prevalon boots on at all times Wound Treatment Wound #2 - Lower Leg Wound Laterality: Right,  Posterior Topical: Betadine 1 x Per Day/30 Days Discharge Instructions: Paint Betadine on wounds Secondary Dressing: ABD Pad, 8x10 1 x Per Day/30 Days Discharge Instructions: Apply over primary dressing as directed. Secured With: [employer], 4.5x3.1 (in/yd) 1 x Per Day/30 Days Discharge Instructions: Secure with Kerlix as directed. Secured With: 1M Medipore H Soft Cloth Surgical T ape, 4 x 10 (in/yd) 1 x Per Day/30 Days Discharge Instructions: Secure with tape as directed. Wound #3 - Calcaneus Wound Laterality: Right Topical: Betadine 1 x Per Day/30 Days Discharge Instructions: Paint Betadine on wounds Secondary Dressing: ABD Pad, 8x10 1 x Per Day/30 Days Discharge Instructions: Apply over primary dressing as directed. Secured With: [employer], 4.5x3.1 (in/yd) 1 x Per Day/30 Days Discharge Instructions: Secure with Kerlix as directed. Secured With: 1M Medipore H Soft Cloth Surgical T ape, 4 x 10 (in/yd) 1 x Per Day/30 Days Discharge Instructions: Secure with tape as directed. Wound #4 - Foot Wound Laterality: Dorsal, Right Topical: Betadine 1 x Per Day/30 Days Discharge Instructions: Paint Betadine on wounds Secondary Dressing: ABD Pad, 8x10 1 x Per Day/30 Days Discharge Instructions: Apply over primary dressing as directed. Secured With: [employer], 4.5x3.1 (in/yd) 1 x Per Day/30 Days Discharge Instructions: Secure with Kerlix as directed. Secured With: 1M Medipore H Soft Cloth Surgical T ape, 4 x 10 (in/yd) 1 x Per Day/30 Days Discharge Instructions: Secure with tape as directed. Electronic Signature(s) Signed: 07/29/2022 4:44:39 PM By: Allen Derry PA-C Signed: 07/29/2022 5:12:49 PM By: Karie Schwalbe RN Entered By: Karie Schwalbe on 07/29/2022 15:22:10 [name] ([id number]) [id number] Page 4 of 7 -------------------------------------------------------------------------------- Problem List Details Patient Name: Date  of Service: ANASTASI, KEGG 07/29/2022 2:45 PM Medical Record Number: [id number] Patient Account Number: [id number] Date of Birth/Sex: Treating RN: 10/17/1922 (89 y.o. F) Primary Care Provider: Eloisa Northern Other Clinician: Referring Provider: Treating Provider/Extender: Gaynelle Arabian in Treatment: 7 Active Problems ICD-10 Encounter Code Description Active Date MDM Diagnosis I73.89 Other specified peripheral vascular diseases 06/10/2022 No Yes L97.818 Non-pressure chronic ulcer of other part of right lower leg with other specified 06/10/2022 No Yes severity L89.610 Pressure ulcer of right heel, unstageable 06/10/2022 No Yes I10 Essential (primary) hypertension 06/10/2022 No Yes J44.9 Chronic obstructive pulmonary disease, unspecified 06/10/2022 No Yes M62.81 Muscle weakness (generalized) 06/10/2022 No Yes I48.0 Paroxysmal atrial fibrillation 06/10/2022 No Yes Inactive Problems Resolved Problems Electronic Signature(s) Signed: 07/29/2022 3:05:13 PM By: Allen Derry PA-C Entered By: Allen Derry on 07/29/2022 15:05:13 -------------------------------------------------------------------------------- Progress Note Details Patient Name: Date of Service: Illene Bolus. 07/29/2022 2:45 PM Medical Record Number: [id number] Patient Account Number: [id number] Date of Birth/Sex: Treating RN: [birth year] (89 y.o. F) Primary Care Provider: Eloisa Northern Other Clinician: Referring Provider: Treating Provider/Extender: Gaynelle Arabian in Treatment: 7 Subjective Chief Complaint Information obtained from Patient Right LE Ulcers History of Present Illness (HPI) 01/08/2021 patient presents today for evaluation of a pressure ulcer which began after she fell in July and broke her arm. This unfortunately has led to her being overall more generally weak and she is not really up and moving around  much at all. While she was in the hospital she developed the heel ulcers  bilaterally the right was more deep tissue injury that pretty much appears to be healed at this point. At the current skilled nursing facility they have actually been doing a [place], [place] R ([id number]) [id number] Page 5 of 7 great job taking care of her according to her friend who also helps take care of her. Subsequently the patient's wound on the left heel is eschar covered and probably would benefit from clearing away this eschar so that she can actually see some improvements hopefully. Currently they have been utilizing Iodosorb along with a border foam dressing. Patient has a history of hypertension, COPD, generalized muscle weakness, and atrial fibrillation. Due to her age they elected not to place her on any blood thinners for the atrial fibrillation. 01/15/2021 upon evaluation today patient appears to be doing better in regard to the heel ulcer. I do not have initially upon evaluation today her arterial studies that were done at the facility. Therefore I did not perform any sharp debridement while she was present during the office visit today. Nonetheless I am going to suggest based on what was seen currently that we probably once we get this with you at everything appears to be okay consider stop debridement at the next visit. 01/29/2021 upon evaluation today patient appears to be doing well with regard to her wound all things considered her blood flow is definitely not very good based on the review of the arterial study although it was really incomplete in my opinion there was not even a brachial reading to compare to but nonetheless the readings in the extremities were very poor. In the end I did actually discuss with her going ahead and going to vascular and they have already get that appointment set up which is great news. Fortunately there does not appear to be signs of infection currently. The wound is really not making tremendous progress but again I  would not expect it to do so with where things stand currently until we can get this moving a little bit better direction. 02/19/2021 upon evaluation today patient's wound actually showing signs of doing about the same the still very thick region of eschar noted currently. I do think that based on the fact that there really is nothing from a vascular standpoint recommend currently which I completely understand that this is still something that is going to require as aggressive as we can wound care obviously I am not going to perform any debridement but nonetheless I do think that good wound care including Santyl can be beneficial for the patient currently. 03/12/2021 upon evaluation today patient appears to be doing well with regard to her wound. Fortunately there does not appear to be any signs of active infection at this time which is great news. No fevers, chills, nausea, vomiting, or diarrhea. I do feel like the [employer] Popper is doing a good job although we need to make sure that they are put in a saline moistened gauze and behind to make sure this stays nice and moist it seems to be a little bit dry. 04/02/2021 upon evaluation today patient's wound is actually showing signs of good improvement. Fortunately there does not appear to be any evidence of active infection locally nor systemically at this time which is great news. No fevers, chills, nausea, vomiting, or diarrhea. 04/23/2021 upon evaluation today patient's wound is actually showing signs of being a little bit smaller which is good news  and I think that we are headed in the right direction. Obviously this is something that is going to continue to slowly progress I believe and again as long as we keep it clean and keep it from getting infected that is going to be the big thing. 05/14/2021: Today, the wound has contracted even more. There continues to be loose but somewhat thick yellow slough in the wound base. The wound is moist. 06-04-2021 upon  evaluation today patient appears to be doing well currently in regard to her heel ulcer. I am actually extremely pleased with where we stand and I think the patient is making good progress here. There does not appear to be any signs of active infection locally or systemically which is great news. 06-25-2021 upon evaluation today patient's wound is actually showing signs of excellent improvement. I am actually very pleased with where we stand and I think the patient is making excellent progress. There does not appear to be any signs of active infection at this time. No fevers, chills, nausea, vomiting, or diarrhea. 07-23-2021 upon evaluation today patient's wound actually appears to be doing quite well. Fortunately there does not appear to be any evidence of active infection locally nor systemically which is great news. No fevers, chills, nausea, vomiting, or diarrhea. Readmission: 06-10-2022 upon evaluation today patient presents for initial inspection here in our clinic concerning issues that she has been having with wounds over the right lower extremity. This is part of the leg as well as the ankle region unfortunately there is significant necrosis noted in the way of eschar. Actually did perform a fairly extensive record review through epic today. With that being said unfortunately the patient continues to have a lot of issues here with her arterial flow I did review Dr. [name] note and there does not appear to be any simple way to fix this blood flow. In fact he states that she is not a candidate for revascularization at this point she is looking at basically more palliative care versus a above-knee amputation of this extremity. Again this is an unfortunate situation of being but again at 89 years old I discussed with the patient that I am not sure I would recommend going the extensive route of a amputation surgery especially in light of the fact that she never know if that is going to turn out to be  as good is everybody is hoping as it stands anyway. She voiced understanding. With that being said for now she is really not super interested in going with amputation but nonetheless is a little unsure of what exactly to do. She does I think have a very low chance of getting this to actually heal completely. 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 06-24-2022 upon evaluation today patient appears to be doing poorly still in regard to her leg ulcerations. Fortunately I do not see any signs of infection spreading systemically which is good news. However I do feel like that locally she may have some infection and notes she is good to be meeting virtually with Dr. Ilsa Iha tomorrow who is an infectious disease specialist. Subsequently also noted that her blood flow was not optimal so our goal right now is more of a palliative option as far as this is concerned were going to try to keep her wounds clean from being infected and hopefully  keep her as healthy as possible for as long as possible. 07-29-2022 upon evaluation today patient appears to be doing about the same in regards to the wounds on her leg, ankle, and foot region. This is on the right side. With that being said she did see Dr. Lajoyce Corners to and it was recommended that conservative measures are really the best way to go at this point and he suggested that she continue to come to the wound care center. As far as any other options his only other option would be the potential for an above-knee amputation which I really do not think is the ideal way to go. Objective Constitutional Well-nourished and well-hydrated in no acute distress. Vitals Time Taken: 2:58 PM, Pulse: 60 bpm, Respiratory Rate: 18 breaths/min, Blood Pressure: 151/81 mmHg. Respiratory normal breathing  without difficulty. OBELIA, SATCHWELL R ([id number]) [id number] Page 6 of 7 Psychiatric this patient is able to make decisions and demonstrates good insight into disease process. Alert and Oriented x 3. pleasant and cooperative. General Notes: Upon inspection patient did have mainly what appeared to be dry eschar I think the Betadine is currently doing well I think Betadine with a dry gauze is good to be the best way to continue at this point and I am overall very pleased with where things stand currently. Integumentary (Hair, Skin) Wound #2 status is Open. Original cause of wound was Pressure Injury. The date acquired was: 05/27/2022. The wound has been in treatment 7 weeks. The wound is located on the Right,Posterior Lower Leg. The wound measures 7.9cm length x 5cm width x 0.1cm depth; 31.023cm^2 area and 3.102cm^3 volume. There is no tunneling or undermining noted. There is a medium amount of serosanguineous drainage noted. There is small (1-33%) red granulation within the wound bed. There is a large (67-100%) amount of necrotic tissue within the wound bed including Eschar and Adherent Slough. Wound #3 status is Open. Original cause of wound was Pressure Injury. The date acquired was: 05/27/2022. The wound has been in treatment 7 weeks. The wound is located on the Right Calcaneus. The wound measures 10cm length x 7.4cm width x 0.1cm depth; 58.119cm^2 area and 5.812cm^3 volume. There is no tunneling or undermining noted. There is a medium amount of serosanguineous drainage noted. There is no granulation within the wound bed. There is a large (67-100%) amount of necrotic tissue within the wound bed including Eschar and Adherent Slough. The periwound skin appearance did not exhibit: Callus, Crepitus, Excoriation, Induration, Rash, Scarring, Dry/Scaly, Maceration, Atrophie Blanche, Cyanosis, Ecchymosis, Hemosiderin Staining, Mottled, Pallor, Rubor, Erythema. Wound #4 status  is Open. Original cause of wound was Gradually Appeared. The date acquired was: 06/24/2022. The wound has been in treatment 5 weeks. The wound is located on the Right,Dorsal Foot. The wound measures 7.9cm length x 9cm width x 0.1cm depth; 55.842cm^2 area and 5.584cm^3 volume. There is no tunneling or undermining noted. There is a medium amount of serosanguineous drainage noted. The wound margin is distinct with the outline attached to the wound base. There is no granulation within the wound bed. There is a large (67-100%) amount of necrotic tissue within the wound bed including Eschar. The periwound skin appearance did not exhibit: Callus, Crepitus, Excoriation, Induration, Rash, Scarring, Dry/Scaly, Maceration, Atrophie Blanche, Cyanosis, Ecchymosis, Hemosiderin Staining, Mottled, Pallor, Rubor, Erythema. Assessment Active Problems ICD-10 Other specified peripheral vascular diseases Non-pressure chronic ulcer of other part of right lower leg with other specified severity Pressure ulcer of right heel, unstageable Essential (primary) hypertension Chronic obstructive  pulmonary disease, unspecified Muscle weakness (generalized) Paroxysmal atrial fibrillation Plan Follow-up Appointments: Return Appointment in 2 weeks. Allen Derry, PA ***[place]oyer*** Do not put right before lunch or at the end of day*** Bathing/ Shower/ Hygiene: May shower and wash wound with soap and water. Off-Loading: Prevalon Boot - DO NOT USE ANY COMPRESSION TO LEGS Prevalon boots on at all times WOUND #2: - Lower Leg Wound Laterality: Right, Posterior Topical: Betadine 1 x Per Day/30 Days Discharge Instructions: Paint Betadine on wounds Secondary Dressing: ABD Pad, 8x10 1 x Per Day/30 Days Discharge Instructions: Apply over primary dressing as directed. Secured With: [employer], 4.5x3.1 (in/yd) 1 x Per Day/30 Days Discharge Instructions: Secure with Kerlix as directed. Secured With: 67M Medipore H Soft Cloth  Surgical T ape, 4 x 10 (in/yd) 1 x Per Day/30 Days Discharge Instructions: Secure with tape as directed. WOUND #3: - Calcaneus Wound Laterality: Right Topical: Betadine 1 x Per Day/30 Days Discharge Instructions: Paint Betadine on wounds Secondary Dressing: ABD Pad, 8x10 1 x Per Day/30 Days Discharge Instructions: Apply over primary dressing as directed. Secured With: [employer], 4.5x3.1 (in/yd) 1 x Per Day/30 Days Discharge Instructions: Secure with Kerlix as directed. Secured With: 67M Medipore H Soft Cloth Surgical T ape, 4 x 10 (in/yd) 1 x Per Day/30 Days Discharge Instructions: Secure with tape as directed. WOUND #4: - Foot Wound Laterality: Dorsal, Right Topical: Betadine 1 x Per Day/30 Days Discharge Instructions: Paint Betadine on wounds Secondary Dressing: ABD Pad, 8x10 1 x Per Day/30 Days Discharge Instructions: Apply over primary dressing as directed. Secured With: [employer], 4.5x3.1 (in/yd) 1 x Per Day/30 Days Discharge Instructions: Secure with Kerlix as directed. Secured With: 67M Medipore H Soft Cloth Surgical T ape, 4 x 10 (in/yd) 1 x Per Day/30 Days Discharge Instructions: Secure with tape as directed. 1. I am good recommend that we have the patient continue to monitor for any evidence of infection or worsening. I do believe that we are making good headway here as far as can be expected I do not think that proceeding with hyperbarics will be a good idea due to the fact that among other things the patient does have COPD and heart disease and we would need clearance from both of those physicians before proceeding. Furthermore her hearing is not good and CAROL, SIDNEY R ([id number]) [id number] Page 7 of 7 think will be difficult to get her to follow commands if she were in the chamber such as being able to put on oxygen mask. Nonetheless I think this makes it a little bit risky for her to undergo any HBO therapy. 2. I would  recommend she continue with the antibiotics Dr. Ilsa Iha is managing this. 3. I would also recommend that we continue with the ABD pads to cover after applying the Betadine and then dry gauze to secure in place wrapped lightly. We will see patient back for reevaluation in 1 week here in the clinic. If anything worsens or changes patient will contact our office for additional recommendations. Electronic Signature(s) Signed: 07/29/2022 4:09:41 PM By: Allen Derry PA-C Entered By: Allen Derry on 07/29/2022 16:09:41 -------------------------------------------------------------------------------- SuperBill Details Patient Name: Date of Service: [name], [name] 07/29/2022 Medical Record Number: [id number] Patient Account Number: [id number] Date of Birth/Sex: Treating RN: 08/13/1922 (89 y.o. [name] Primary Care Provider: Eloisa Northern Other Clinician: Referring Provider: Treating Provider/Extender: Gaynelle Arabian in Treatment: 7 Diagnosis Coding ICD-10 Codes Code Description I73.89 Other specified  peripheral vascular diseases L97.818 Non-pressure chronic ulcer of other part of right lower leg with other specified severity L89.610 Pressure ulcer of right heel, unstageable I10 Essential (primary) hypertension J44.9 Chronic obstructive pulmonary disease, unspecified M62.81 Muscle weakness (generalized) I48.0 Paroxysmal atrial fibrillation Facility Procedures : CPT4 Code: 16109604 Description: 54098 - WOUND CARE VISIT-LEV 5 EST PT Modifier: Quantity: 1 Physician Procedures : CPT4 Code Description Modifier [id number] - WC PHYS LEVEL 3 - EST PT ICD-10 Diagnosis Description I73.89 Other specified peripheral vascular diseases L97.818 Non-pressure chronic ulcer of other part of right lower leg with other specified severity  L89.610 Pressure ulcer of right heel, unstageable I10 Essential (primary) hypertension Quantity: 1 Electronic Signature(s) Signed: 07/29/2022  4:10:07 PM By: Allen Derry PA-C Entered By: Allen Derry on 07/29/2022 16:10:06

## 2022-08-05 DIAGNOSIS — L89610 Pressure ulcer of right heel, unstageable: Secondary | ICD-10-CM | POA: Diagnosis not present

## 2022-08-05 DIAGNOSIS — L97311 Non-pressure chronic ulcer of right ankle limited to breakdown of skin: Secondary | ICD-10-CM | POA: Diagnosis not present

## 2022-08-05 DIAGNOSIS — L89896 Pressure-induced deep tissue damage of other site: Secondary | ICD-10-CM | POA: Diagnosis not present

## 2022-08-05 DIAGNOSIS — L97511 Non-pressure chronic ulcer of other part of right foot limited to breakdown of skin: Secondary | ICD-10-CM | POA: Diagnosis not present

## 2022-08-12 ENCOUNTER — Encounter (HOSPITAL_BASED_OUTPATIENT_CLINIC_OR_DEPARTMENT_OTHER): Payer: Medicare PPO | Attending: Physician Assistant | Admitting: Physician Assistant

## 2022-08-12 DIAGNOSIS — Z7401 Bed confinement status: Secondary | ICD-10-CM | POA: Insufficient documentation

## 2022-08-12 DIAGNOSIS — J449 Chronic obstructive pulmonary disease, unspecified: Secondary | ICD-10-CM | POA: Insufficient documentation

## 2022-08-12 DIAGNOSIS — I480 Paroxysmal atrial fibrillation: Secondary | ICD-10-CM | POA: Insufficient documentation

## 2022-08-12 DIAGNOSIS — L89890 Pressure ulcer of other site, unstageable: Secondary | ICD-10-CM | POA: Diagnosis not present

## 2022-08-12 DIAGNOSIS — M1990 Unspecified osteoarthritis, unspecified site: Secondary | ICD-10-CM | POA: Insufficient documentation

## 2022-08-12 DIAGNOSIS — L97819 Non-pressure chronic ulcer of other part of right lower leg with unspecified severity: Secondary | ICD-10-CM | POA: Diagnosis present

## 2022-08-12 DIAGNOSIS — L97818 Non-pressure chronic ulcer of other part of right lower leg with other specified severity: Secondary | ICD-10-CM | POA: Insufficient documentation

## 2022-08-12 DIAGNOSIS — L89610 Pressure ulcer of right heel, unstageable: Secondary | ICD-10-CM | POA: Diagnosis not present

## 2022-08-12 DIAGNOSIS — I10 Essential (primary) hypertension: Secondary | ICD-10-CM | POA: Diagnosis not present

## 2022-08-12 NOTE — Progress Notes (Addendum)
LUIZA, [name] ([id number]) [id number] Page 1 of 9 Visit Report for 08/12/2022 Chief Complaint Document Details Patient Name: Date of Service: [name], [name] 08/12/2022 9:30 A M Medical Record Number: [id number] Patient Account Number: [id number] Date of Birth/Sex: Treating RN: [birth year] (89 y.o. F) Primary Care Provider: Eloisa Northern Other Clinician: Referring Provider: Treating Provider/Extender: Gaynelle Arabian in Treatment: 9 Information Obtained from: Patient Chief Complaint Right LE Ulcers Electronic Signature(s) Signed: 08/12/2022 9:49:21 AM By: Allen Derry PA-C Entered By: Allen Derry on 08/12/2022 09:49:21 -------------------------------------------------------------------------------- Debridement Details Patient Name: Date of Service: [name], [name] Burton [name]. 08/12/2022 9:30 A M Medical Record Number: [id number] Patient Account Number: [id number] Date of Birth/Sex: Treating RN: 04/09/1922 (89 y.o. Arta Silence Primary Care Provider: Eloisa Northern Other Clinician: Referring Provider: Treating Provider/Extender: Gaynelle Arabian in Treatment: 9 Debridement Performed for Assessment: Wound #4 Right,Dorsal Foot Performed By: Physician Lenda Kelp, PA Debridement Type: Debridement Level of Consciousness (Pre-procedure): Awake and Alert Pre-procedure Verification/Time Out Yes - 09:50 Taken: Start Time: 09:51 Percent of Wound Bed Debrided: 25% T Area Debrided (cm): otal 11.48 Tissue and other material debrided: Non-Viable, Eschar, Skin: Epidermis Level: Skin/Epidermis Debridement Description: Selective/Open Wound Instrument: Curette Bleeding: None End Time: 10:00 Procedural Pain: 0 Post Procedural Pain: 0 Response to Treatment: Procedure was tolerated well Level of Consciousness (Post- Awake and Alert procedure): Post Debridement Measurements of Total Wound Length: (cm) 6.5 Stage:  Unstageable/Unclassified Width: (cm) 9 Depth: (cm) 0.1 Volume: (cm) 4.595 Character of Wound/Ulcer Post Debridement: Stable Post Procedure Diagnosis Same as [name], [name] [id number] Page 2 of 9 Electronic Signature(s) Signed: 08/12/2022 4:47:25 PM By: Allen Derry PA-C Signed: 08/13/2022 5:31:32 PM By: Shawn Stall RN, BSN Entered By: Shawn Stall on 08/12/2022 10:00:59 -------------------------------------------------------------------------------- HPI Details Patient Name: Date of Service: [name], [name] Burton [name]. 08/12/2022 9:30 A M Medical Record Number: [id number] Patient Account Number: [id number] Date of Birth/Sex: Treating RN: 07/30/1922 (89 y.o. F) Primary Care Provider: Eloisa Northern Other Clinician: Referring Provider: Treating Provider/Extender: Gaynelle Arabian in Treatment: 9 History of Present Illness HPI Description: 01/08/2021 patient presents today for evaluation of a pressure ulcer which began after she fell in July and broke her arm. This unfortunately has led to her being overall more generally weak and she is not really up and moving around much at all. While she was in the hospital she developed the heel ulcers bilaterally the right was more deep tissue injury that pretty much appears to be healed at this point. At the current skilled nursing facility they have actually been doing a great job taking care of her according to her friend who also helps take care of her. Subsequently the patient's wound on the left heel is eschar covered and probably would benefit from clearing away this eschar so that she can actually see some improvements hopefully. Currently they have been utilizing Iodosorb along with a border foam dressing. Patient has a history of hypertension, COPD, generalized muscle weakness, and atrial fibrillation. Due to her age they elected not to place her on any blood thinners for the  atrial fibrillation. 01/15/2021 upon evaluation today patient appears to be doing better in regard to the heel ulcer. I do not have initially upon evaluation today her arterial studies that were done at the facility. Therefore I did not perform any sharp debridement while she was present during the office visit today. Nonetheless I am going to suggest based on what  was seen currently that we probably once we get this with you at everything appears to be okay consider stop debridement at the next visit. 01/29/2021 upon evaluation today patient appears to be doing well with regard to her wound all things considered her blood flow is definitely not very good based on the review of the arterial study although it was really incomplete in my opinion there was not even a brachial reading to compare to but nonetheless the readings in the extremities were very poor. In the end I did actually discuss with her going ahead and going to vascular and they have already get that appointment set up which is great news. Fortunately there does not appear to be signs of infection currently. The wound is really not making tremendous progress but again I would not expect it to do so with where things stand currently until we can get this moving a little bit better direction. 02/19/2021 upon evaluation today patient's wound actually showing signs of doing about the same the still very thick region of eschar noted currently. I do think that based on the fact that there really is nothing from a vascular standpoint recommend currently which I completely understand that this is still something that is going to require as aggressive as we can wound care obviously I am not going to perform any debridement but nonetheless I do think that good wound care including Santyl can be beneficial for the patient currently. 03/12/2021 upon evaluation today patient appears to be doing well with regard to her wound. Fortunately there does not  appear to be any signs of active infection at this time which is great news. No fevers, chills, nausea, vomiting, or diarrhea. I do feel like the [facility] Popper is doing a good job although we need to make sure that they are put in a saline moistened gauze and behind to make sure this stays nice and moist it seems to be a little bit dry. 04/02/2021 upon evaluation today patient's wound is actually showing signs of good improvement. Fortunately there does not appear to be any evidence of active infection locally nor systemically at this time which is great news. No fevers, chills, nausea, vomiting, or diarrhea. 04/23/2021 upon evaluation today patient's wound is actually showing signs of being a little bit smaller which is good news and I think that we are headed in the right direction. Obviously this is something that is going to continue to slowly progress I believe and again as long as we keep it clean and keep it from getting infected that is going to be the big thing. 05/14/2021: Today, the wound has contracted even more. There continues to be loose but somewhat thick yellow slough in the wound base. The wound is moist. 06-04-2021 upon evaluation today patient appears to be doing well currently in regard to her heel ulcer. I am actually extremely pleased with where we stand and I think the patient is making good progress here. There does not appear to be any signs of active infection locally or systemically which is great news. 06-25-2021 upon evaluation today patient's wound is actually showing signs of excellent improvement. I am actually very pleased with where we stand and I think the patient is making excellent progress. There does not appear to be any signs of active infection at this time. No fevers, chills, nausea, vomiting, or diarrhea. 07-23-2021 upon evaluation today patient's wound actually appears to be doing quite well. Fortunately there does not appear to be any evidence  of active infection locally  nor systemically which is great news. No fevers, chills, nausea, vomiting, or diarrhea. Readmission: 06-10-2022 upon evaluation today patient presents for initial inspection here in our clinic concerning issues that she has been having with wounds over the right lower extremity. This is part of the leg as well as the ankle region unfortunately there is significant necrosis noted in the way of eschar. Actually did perform a fairly extensive record review through epic today. With that being said unfortunately the patient continues to have a lot of issues here with her arterial flow I did review Dr. [name] note and there does not appear to be any simple way to fix this blood flow. In fact he states that she is not a candidate for revascularization at this point she is looking at basically more palliative care versus a above-knee amputation of this extremity. Again this is an unfortunate situation of being but again at 89 years old I discussed with the patient that I am not sure I would recommend going the extensive route of a amputation surgery especially in light of the fact that she never know if that is going to turn out to be as good is everybody is hoping as it stands anyway. She voiced understanding. With that being said for now she is really not super interested in going with amputation but nonetheless is a little unsure of what exactly to do. [name], [name] ([id number]) [id number] Page 3 of 9 She does I think have a very low chance of getting this to actually heal completely. 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 06-24-2022 upon evaluation today patient appears to be doing poorly still in regard to her leg ulcerations. Fortunately I do not see any signs of infection spreading  systemically which is good news. However I do feel like that locally she may have some infection and notes she is good to be meeting virtually with Dr. Ilsa Iha tomorrow who is an infectious disease specialist. Subsequently also noted that her blood flow was not optimal so our goal right now is more of a palliative option as far as this is concerned were going to try to keep her wounds clean from being infected and hopefully keep her as healthy as possible for as long as possible. 07-29-2022 upon evaluation today patient appears to be doing about the same in regards to the wounds on her leg, ankle, and foot region. This is on the right side. With that being said she did see Dr. Lajoyce Corners to and it was recommended that conservative measures are really the best way to go at this point and he suggested that she continue to come to the wound care center. As far as any other options his only other option would be the potential for an above-knee amputation which I really do not think is the ideal way to go. 08-12-2022 upon evaluation today patient appears to be doing decently well in regard to her wounds all things considered. Fortunately there does not appear to be any signs of active infection at this time which is great news and some of the eschar is slowly started to come off on Monday perform light debridement on the areas today. Electronic Signature(s) Signed: 08/12/2022 3:31:08 PM By: Allen Derry PA-C Entered By: Allen Derry on 08/12/2022 15:31:08 -------------------------------------------------------------------------------- Physical Exam Details Patient Name: Date  of Service: [name], [name] 08/12/2022 9:30 A M Medical Record Number: [id number] Patient Account Number: [id number] Date of Birth/Sex: Treating RN: August 19, 1922 (89 y.o. F) Primary Care Provider: Eloisa Northern Other Clinician: Referring Provider: Treating Provider/Extender: Gaynelle Arabian in Treatment:  9 Constitutional Well-nourished and well-hydrated in no acute distress. Respiratory normal breathing without difficulty. Psychiatric this patient is able to make decisions and demonstrates good insight into disease process. Alert and Oriented x 3. pleasant and cooperative. Notes Upon evaluation patient's wound actually currently showed signs of having some necrotic tissue and subsequently I do believe that this is more eschar than anything some of the areas are healed underneath I did very carefully remove some of the eschar and areas where they seem to be lifting up so that we can allow the Betadine to continue to work we will get a continue with the Betadine paints daily which I think has been beneficial. Electronic Signature(s) Signed: 08/12/2022 3:31:40 PM By: Allen Derry PA-C Entered By: Allen Derry on 08/12/2022 15:31:39 -------------------------------------------------------------------------------- Physician Orders Details Patient Name: Date of Service: [name], [name] Burton [name]. 08/12/2022 9:30 A M Medical Record Number: [id number] Patient Account Number: [id number] Date of Birth/Sex: Treating RN: January 14, 1923 (89 y.o. Arta Silence Primary Care Provider: Eloisa Northern Other Clinician: Referring Provider: Treating Provider/Extender: Gaynelle Arabian in Treatment: 9 Verbal / Phone Orders: No [name], [name] ([id number]) [id number] Page 4 of 9 Diagnosis Coding ICD-10 Coding Code Description I73.89 Other specified peripheral vascular diseases L97.818 Non-pressure chronic ulcer of other part of right lower leg with other specified severity L89.610 Pressure ulcer of right heel, unstageable I10 Essential (primary) hypertension J44.9 Chronic obstructive pulmonary disease, unspecified M62.81 Muscle weakness (generalized) I48.0 Paroxysmal atrial fibrillation Follow-up Appointments ppointment in 2 weeks. Allen Derry, PA room 9 0930  08/26/2022 Return A ***[facility]*** Do not put right before lunch or at the end of day*** Return appointment in 1 month. Allen Derry, PA room 9 0930 09/09/2022 ***[facility]*** Do not put right before lunch or at the end of day*** Bathing/ Shower/ Hygiene May shower and wash wound with soap and water. Off-Loading Prevalon Boot - DO NOT USE ANY COMPRESSION TO LEGS Prevalon boots on at all times Wound Treatment Wound #2 - Lower Leg Wound Laterality: Right, Posterior Topical: Betadine 1 x Per Day/30 Days Discharge Instructions: Paint Betadine on wounds Secondary Dressing: ABD Pad, 8x10 1 x Per Day/30 Days Discharge Instructions: Apply over primary dressing as directed. Secured With: [employer], 4.5x3.1 (in/yd) 1 x Per Day/30 Days Discharge Instructions: Secure with Kerlix as directed. Secured With: 60M Medipore H Soft Cloth Surgical T ape, 4 x 10 (in/yd) 1 x Per Day/30 Days Discharge Instructions: Secure with tape as directed. Wound #3 - Calcaneus Wound Laterality: Right Topical: Betadine 1 x Per Day/30 Days Discharge Instructions: Paint Betadine on wounds Secondary Dressing: ABD Pad, 8x10 1 x Per Day/30 Days Discharge Instructions: Apply over primary dressing as directed. Secured With: [employer], 4.5x3.1 (in/yd) 1 x Per Day/30 Days Discharge Instructions: Secure with Kerlix as directed. Secured With: 60M Medipore H Soft Cloth Surgical T ape, 4 x 10 (in/yd) 1 x Per Day/30 Days Discharge Instructions: Secure with tape as directed. Wound #4 - Foot Wound Laterality: Dorsal, Right Topical: Betadine 1 x Per Day/30 Days Discharge Instructions: Paint Betadine on wounds Secondary Dressing: ABD Pad, 8x10 1 x Per Day/30 Days Discharge Instructions: Apply over primary dressing as directed. Secured With: [employer],  4.5x3.1 (in/yd) 1 x Per Day/30 Days Discharge Instructions: Secure with Kerlix as directed. Secured With: 39M Medipore H Soft Cloth Surgical T ape, 4 x 10 (in/yd) 1 x  Per Day/30 Days Discharge Instructions: Secure with tape as directed. Wound #5 - Foot Wound Laterality: Left, Medial Topical: Betadine 1 x Per Day/30 Days Discharge Instructions: Paint Betadine on wounds Secondary Dressing: ABD Pad, 8x10 1 x Per Day/30 Days Discharge Instructions: Apply over primary dressing as directed. Secured With: [employer], 4.5x3.1 (in/yd) 1 x Per Day/30 Days Discharge Instructions: Secure with Kerlix as directed. [name], [name] ([id number]) [id number] Page 5 of 9 Secured With: 39M Medipore H Soft Cloth Surgical T ape, 4 x 10 (in/yd) 1 x Per Day/30 Days Discharge Instructions: Secure with tape as directed. Electronic Signature(s) Signed: 08/12/2022 4:47:25 PM By: Allen Derry PA-C Signed: 08/13/2022 5:31:32 PM By: Shawn Stall RN, BSN Entered By: Shawn Stall on 08/12/2022 10:00:04 -------------------------------------------------------------------------------- Problem List Details Patient Name: Date of Service: [name], [name] Burton [name]. 08/12/2022 9:30 A M Medical Record Number: [id number] Patient Account Number: [id number] Date of Birth/Sex: Treating RN: 11/12/1922 (89 y.o. F) Primary Care Provider: Eloisa Northern Other Clinician: Referring Provider: Treating Provider/Extender: Gaynelle Arabian in Treatment: 9 Active Problems ICD-10 Encounter Code Description Active Date MDM Diagnosis I73.89 Other specified peripheral vascular diseases 06/10/2022 No Yes L97.818 Non-pressure chronic ulcer of other part of right lower leg with other specified 06/10/2022 No Yes severity L89.610 Pressure ulcer of right heel, unstageable 06/10/2022 No Yes I10 Essential (primary) hypertension 06/10/2022 No Yes J44.9 Chronic obstructive pulmonary disease, unspecified 06/10/2022 No Yes M62.81 Muscle weakness (generalized) 06/10/2022 No Yes I48.0 Paroxysmal atrial fibrillation 06/10/2022 No Yes Inactive Problems Resolved  Problems Electronic Signature(s) Signed: 08/12/2022 9:48:48 AM By: Allen Derry PA-C Entered By: Allen Derry on 08/12/2022 09:48:47 [name] ([id number]) [id number] Page 6 of 9 -------------------------------------------------------------------------------- Progress Note Details Patient Name: Date of Service: [name], [name] 08/12/2022 9:30 A M Medical Record Number: [id number] Patient Account Number: [id number] Date of Birth/Sex: Treating RN: [birth year] (89 y.o. F) Primary Care Provider: Eloisa Northern Other Clinician: Referring Provider: Treating Provider/Extender: Gaynelle Arabian in Treatment: 9 Subjective Chief Complaint Information obtained from Patient Right LE Ulcers History of Present Illness (HPI) 01/08/2021 patient presents today for evaluation of a pressure ulcer which began after she fell in July and broke her arm. This unfortunately has led to her being overall more generally weak and she is not really up and moving around much at all. While she was in the hospital she developed the heel ulcers bilaterally the right was more deep tissue injury that pretty much appears to be healed at this point. At the current skilled nursing facility they have actually been doing a great job taking care of her according to her friend who also helps take care of her. Subsequently the patient's wound on the left heel is eschar covered and probably would benefit from clearing away this eschar so that she can actually see some improvements hopefully. Currently they have been utilizing Iodosorb along with a border foam dressing. Patient has a history of hypertension, COPD, generalized muscle weakness, and atrial fibrillation. Due to her age they elected not to place her on any blood thinners for the atrial fibrillation. 01/15/2021 upon evaluation today patient appears to be doing better in regard to the heel ulcer. I do not have initially upon  evaluation today her arterial studies that were done at the facility. Therefore  I did not perform any sharp debridement while she was present during the office visit today. Nonetheless I am going to suggest based on what was seen currently that we probably once we get this with you at everything appears to be okay consider stop debridement at the next visit. 01/29/2021 upon evaluation today patient appears to be doing well with regard to her wound all things considered her blood flow is definitely not very good based on the review of the arterial study although it was really incomplete in my opinion there was not even a brachial reading to compare to but nonetheless the readings in the extremities were very poor. In the end I did actually discuss with her going ahead and going to vascular and they have already get that appointment set up which is great news. Fortunately there does not appear to be signs of infection currently. The wound is really not making tremendous progress but again I would not expect it to do so with where things stand currently until we can get this moving a little bit better direction. 02/19/2021 upon evaluation today patient's wound actually showing signs of doing about the same the still very thick region of eschar noted currently. I do think that based on the fact that there really is nothing from a vascular standpoint recommend currently which I completely understand that this is still something that is going to require as aggressive as we can wound care obviously I am not going to perform any debridement but nonetheless I do think that good wound care including Santyl can be beneficial for the patient currently. 03/12/2021 upon evaluation today patient appears to be doing well with regard to her wound. Fortunately there does not appear to be any signs of active infection at this time which is great news. No fevers, chills, nausea, vomiting, or diarrhea. I do feel like the  Mel[facility] Popper is doing a good job although we need to make sure that they are put in a saline moistened gauze and behind to make sure this stays nice and moist it seems to be a little bit dry. 04/02/2021 upon evaluation today patient's wound is actually showing signs of good improvement. Fortunately there does not appear to be any evidence of active infection locally nor systemically at this time which is great news. No fevers, chills, nausea, vomiting, or diarrhea. 04/23/2021 upon evaluation today patient's wound is actually showing signs of being a little bit smaller which is good news and I think that we are headed in the right direction. Obviously this is something that is going to continue to slowly progress I believe and again as long as we keep it clean and keep it from getting infected that is going to be the big thing. 05/14/2021: Today, the wound has contracted even more. There continues to be loose but somewhat thick yellow slough in the wound base. The wound is moist. 06-04-2021 upon evaluation today patient appears to be doing well currently in regard to her heel ulcer. I am actually extremely pleased with where we stand and I think the patient is making good progress here. There does not appear to be any signs of active infection locally or systemically which is great news. 06-25-2021 upon evaluation today patient's wound is actually showing signs of excellent improvement. I am actually very pleased with where we stand and I think the patient is making excellent progress. There does not appear to be any signs of active infection at this time. No fevers, chills, nausea,  vomiting, or diarrhea. 07-23-2021 upon evaluation today patient's wound actually appears to be doing quite well. Fortunately there does not appear to be any evidence of active infection locally nor systemically which is great news. No fevers, chills, nausea, vomiting, or diarrhea. Readmission: 06-10-2022 upon evaluation today patient  presents for initial inspection here in our clinic concerning issues that she has been having with wounds over the right lower extremity. This is part of the leg as well as the ankle region unfortunately there is significant necrosis noted in the way of eschar. Actually did perform a fairly extensive record review through epic today. With that being said unfortunately the patient continues to have a lot of issues here with her arterial flow I did review Dr. [name] note and there does not appear to be any simple way to fix this blood flow. In fact he states that she is not a candidate for revascularization at this point she is looking at basically more palliative care versus a above-knee amputation of this extremity. Again this is an unfortunate situation of being but again at 89 years old I discussed with the patient that I am not sure I would recommend going the extensive route of a amputation surgery especially in light of the fact that she never know if that is going to turn out to be as good is everybody is hoping as it stands anyway. She voiced understanding. With that being said for now she is really not super interested in going with amputation but nonetheless is a little unsure of what exactly to do. She does I think have a very low chance of getting this to actually heal completely. 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 06-24-2022 upon evaluation today patient appears to be doing poorly still in regard to her leg ulcerations. Fortunately I do not see any signs of infection [name], [name] ([id number]) [id number] Page 7 of 9 spreading systemically which is good news. However I do feel like that locally she may have some infection and notes she is good to be meeting virtually  with Dr. Ilsa Iha tomorrow who is an infectious disease specialist. Subsequently also noted that her blood flow was not optimal so our goal right now is more of a palliative option as far as this is concerned were going to try to keep her wounds clean from being infected and hopefully keep her as healthy as possible for as long as possible. 07-29-2022 upon evaluation today patient appears to be doing about the same in regards to the wounds on her leg, ankle, and foot region. This is on the right side. With that being said she did see Dr. Lajoyce Corners to and it was recommended that conservative measures are really the best way to go at this point and he suggested that she continue to come to the wound care center. As far as any other options his only other option would be the potential for an above-knee amputation which I really do not think is the ideal way to go. 08-12-2022 upon evaluation today patient appears to be doing decently well in regard to her wounds all things considered. Fortunately there does not appear to be any signs of active infection at this time which is great news and some of the eschar is slowly started to come off on Monday perform light debridement on the areas  today. Objective Constitutional Well-nourished and well-hydrated in no acute distress. Vitals Time Taken: 9:33 AM, Pulse: 60 bpm, Respiratory Rate: 18 breaths/min, Blood Pressure: 142/85 mmHg. Respiratory normal breathing without difficulty. Psychiatric this patient is able to make decisions and demonstrates good insight into disease process. Alert and Oriented x 3. pleasant and cooperative. General Notes: Upon evaluation patient's wound actually currently showed signs of having some necrotic tissue and subsequently I do believe that this is more eschar than anything some of the areas are healed underneath I did very carefully remove some of the eschar and areas where they seem to be lifting up so that we can allow the  Betadine to continue to work we will get a continue with the Betadine paints daily which I think has been beneficial. Integumentary (Hair, Skin) Wound #2 status is Open. Original cause of wound was Pressure Injury. The date acquired was: 05/27/2022. The wound has been in treatment 9 weeks. The wound is located on the Right,Posterior Lower Leg. The wound measures 7.9cm length x 5cm width x 0.1cm depth; 31.023cm^2 area and 3.102cm^3 volume. There is no tunneling or undermining noted. There is a medium amount of serosanguineous drainage noted. There is small (1-33%) red, hyper - granulation within the wound bed. There is a large (67-100%) amount of necrotic tissue within the wound bed including Eschar and Adherent Slough. The periwound skin appearance had no abnormalities noted for moisture. The periwound skin appearance had no abnormalities noted for color. The periwound skin appearance exhibited: Scarring. Wound #3 status is Open. Original cause of wound was Pressure Injury. The date acquired was: 05/27/2022. The wound has been in treatment 9 weeks. The wound is located on the Right Calcaneus. The wound measures 9cm length x 7.5cm width x 0.1cm depth; 53.014cm^2 area and 5.301cm^3 volume. There is no tunneling or undermining noted. There is a medium amount of serosanguineous drainage noted. There is no granulation within the wound bed. There is a large (67-100%) amount of necrotic tissue within the wound bed including Eschar and Adherent Slough. The periwound skin appearance did not exhibit: Callus, Crepitus, Excoriation, Induration, Rash, Scarring, Dry/Scaly, Maceration, Atrophie Blanche, Cyanosis, Ecchymosis, Hemosiderin Staining, Mottled, Pallor, Rubor, Erythema. Wound #4 status is Open. Original cause of wound was Gradually Appeared. The date acquired was: 06/24/2022. The wound has been in treatment 7 weeks. The wound is located on the Right,Dorsal Foot. The wound measures 6.5cm length x 9cm width x  0.1cm depth; 45.946cm^2 area and 4.595cm^3 volume. There is Fat Layer (Subcutaneous Tissue) exposed. There is no tunneling or undermining noted. There is a medium amount of serosanguineous drainage noted. The wound margin is distinct with the outline attached to the wound base. There is no granulation within the wound bed. There is a large (67-100%) amount of necrotic tissue within the wound bed including Eschar. The periwound skin appearance had no abnormalities noted for moisture. The periwound skin appearance exhibited: Scarring. The periwound skin appearance did not exhibit: Callus, Crepitus, Excoriation, Induration, Rash, Atrophie Blanche, Cyanosis, Ecchymosis, Hemosiderin Staining, Mottled, Pallor, Rubor, Erythema. Periwound temperature was noted as No Abnormality. Wound #5 status is Open. Original cause of wound was Gradually Appeared. The date acquired was: 08/12/2022. The wound is located on the Left,Medial Foot. The wound measures 2cm length x 2cm width x 0.1cm depth; 3.142cm^2 area and 0.314cm^3 volume. There is no tunneling or undermining noted. There is a none present amount of drainage noted. The wound margin is distinct with the outline attached to the wound base. There  is a large (67-100%) amount of necrotic tissue within the wound bed including Eschar. The periwound skin appearance did not exhibit: Callus, Crepitus, Excoriation, Induration, Rash, Scarring, Dry/Scaly, Maceration, Atrophie Blanche, Cyanosis, Ecchymosis, Hemosiderin Staining, Mottled, Pallor, Rubor, Erythema. Assessment Active Problems ICD-10 Other specified peripheral vascular diseases Non-pressure chronic ulcer of other part of right lower leg with other specified severity Pressure ulcer of right heel, unstageable Essential (primary) hypertension Chronic obstructive pulmonary disease, unspecified Muscle weakness (generalized) Paroxysmal atrial fibrillation [name], [name] ([id number])  [phone] Page 8 of 9 Procedures Wound #4 Pre-procedure diagnosis of Wound #4 is a Pressure Ulcer located on the Right,Dorsal Foot . There was a Selective/Open Wound Skin/Epidermis Debridement with a total area of 11.48 sq cm performed by Lenda Kelp, PA. With the following instrument(s): Curette to remove Non-Viable tissue/material. Material removed includes Eschar and Skin: Epidermis and. A time out was conducted at 09:50, prior to the start of the procedure. There was no bleeding. The procedure was tolerated well with a pain level of 0 throughout and a pain level of 0 following the procedure. Post Debridement Measurements: 6.5cm length x 9cm width x 0.1cm depth; 4.595cm^3 volume. Post debridement Stage noted as Unstageable/Unclassified. Character of Wound/Ulcer Post Debridement is stable. Post procedure Diagnosis Wound #4: Same as Pre-Procedure Plan Follow-up Appointments: Return Appointment in 2 weeks. Allen Derry, PA room 9 0930 08/26/2022 ***[place]*** Do not put right before lunch or at the end of day*** Return appointment in 1 month. Allen Derry, PA room 9 0930 09/09/2022 ***[place]*** Do not put right before lunch or at the end of day*** Bathing/ Shower/ Hygiene: May shower and wash wound with soap and water. Off-Loading: Prevalon Boot - DO NOT USE ANY COMPRESSION TO LEGS Prevalon boots on at all times WOUND #2: - Lower Leg Wound Laterality: Right, Posterior Topical: Betadine 1 x Per Day/30 Days Discharge Instructions: Paint Betadine on wounds Secondary Dressing: ABD Pad, 8x10 1 x Per Day/30 Days Discharge Instructions: Apply over primary dressing as directed. Secured With: [employer], 4.5x3.1 (in/yd) 1 x Per Day/30 Days Discharge Instructions: Secure with Kerlix as directed. Secured With: 26M Medipore H Soft Cloth Surgical T ape, 4 x 10 (in/yd) 1 x Per Day/30 Days Discharge Instructions: Secure with tape as directed. WOUND #3: - Calcaneus  Wound Laterality: Right Topical: Betadine 1 x Per Day/30 Days Discharge Instructions: Paint Betadine on wounds Secondary Dressing: ABD Pad, 8x10 1 x Per Day/30 Days Discharge Instructions: Apply over primary dressing as directed. Secured With: [employer], 4.5x3.1 (in/yd) 1 x Per Day/30 Days Discharge Instructions: Secure with Kerlix as directed. Secured With: 26M Medipore H Soft Cloth Surgical T ape, 4 x 10 (in/yd) 1 x Per Day/30 Days Discharge Instructions: Secure with tape as directed. WOUND #4: - Foot Wound Laterality: Dorsal, Right Topical: Betadine 1 x Per Day/30 Days Discharge Instructions: Paint Betadine on wounds Secondary Dressing: ABD Pad, 8x10 1 x Per Day/30 Days Discharge Instructions: Apply over primary dressing as directed. Secured With: [employer], 4.5x3.1 (in/yd) 1 x Per Day/30 Days Discharge Instructions: Secure with Kerlix as directed. Secured With: 26M Medipore H Soft Cloth Surgical T ape, 4 x 10 (in/yd) 1 x Per Day/30 Days Discharge Instructions: Secure with tape as directed. WOUND #5: - Foot Wound Laterality: Left, Medial Topical: Betadine 1 x Per Day/30 Days Discharge Instructions: Paint Betadine on wounds Secondary Dressing: ABD Pad, 8x10 1 x Per Day/30 Days Discharge Instructions: Apply over primary dressing as directed. Secured With: [employer]  Roll Sterile, 4.5x3.1 (in/yd) 1 x Per Day/30 Days Discharge Instructions: Secure with Kerlix as directed. Secured With: 72M Medipore H Soft Cloth Surgical T ape, 4 x 10 (in/yd) 1 x Per Day/30 Days Discharge Instructions: Secure with tape as directed. 1. I would recommend currently that the patient should continue to monitor for any evidence of infection or worsening. Based on what I am seeing I do believe that removing in the right direction here which is excellent news. 2. I am also going to suggest that the patient should continue to monitor for any signs of infection in general. Obviously based on what I am  seeing I think that we are making good progress with regard to the eschar all things considered with regard to her blood flow and my hope is that she will continue to see signs of improvement going forward we will monitor every 2 weeks as such. We will see patient back for reevaluation in 2 weeks here in the clinic. If anything worsens or changes patient will contact our office for additional recommendations. Electronic Signature(s) Signed: 08/12/2022 3:32:16 PM By: Allen Derry PA-C Entered By: Allen Derry on 08/12/2022 15:32:16 [name] ([id number]) [id number] Page 9 of 9 -------------------------------------------------------------------------------- SuperBill Details Patient Name: Date of Service: JAVERIA, LENIS 08/12/2022 Medical Record Number: [id number] Patient Account Number: [id number] Date of Birth/Sex: Treating RN: [birth year] (89 y.o. Debara Pickett, Yvonne Kendall Primary Care Provider: Eloisa Northern Other Clinician: Referring Provider: Treating Provider/Extender: Gaynelle Arabian in Treatment: 9 Diagnosis Coding ICD-10 Codes Code Description I73.89 Other specified peripheral vascular diseases L97.818 Non-pressure chronic ulcer of other part of right lower leg with other specified severity L89.610 Pressure ulcer of right heel, unstageable I10 Essential (primary) hypertension J44.9 Chronic obstructive pulmonary disease, unspecified M62.81 Muscle weakness (generalized) I48.0 Paroxysmal atrial fibrillation Facility Procedures : CPT4 Code: [id number] Description: [phone] - DEBRIDE WOUND 1ST 20 SQ CM OR < ICD-10 Diagnosis Description L97.818 Non-pressure chronic ulcer of other part of right lower leg with other specified s L89.610 Pressure ulcer of right heel, unstageable Modifier: everity Quantity: 1 Physician Procedures : CPT4 Code Description Modifier [id number] - WC PHYS DEBR WO ANESTH 20 SQ CM ICD-10 Diagnosis Description L97.818  Non-pressure chronic ulcer of other part of right lower leg with other specified severity L89.610 Pressure ulcer of right heel,  unstageable Quantity: 1 Electronic Signature(s) Signed: 08/12/2022 3:32:28 PM By: Allen Derry PA-C Entered By: Allen Derry on 08/12/2022 15:32:27

## 2022-08-15 NOTE — Progress Notes (Signed)
[name] ([id number]) [id number] Page 1 of 10 Visit Report for 08/12/2022 Arrival Information Details Patient Name: Date of Service: [name], [name] 08/12/2022 9:30 A M Medical Record Number: [id number] Patient Account Number: [id number] Date of Birth/Sex: Treating RN: 1922/09/23 (89 y.o. [name] Primary Care Kenadi Miltner: Eloisa Northern Other Clinician: Referring Journee Bobrowski: Treating Chade Pitner/Extender: Gaynelle Arabian in Treatment: 9 Visit Information History Since Last Visit Added or deleted any medications: No Patient Arrived: Wheel Chair Any new allergies or adverse reactions: No Arrival Time: 09:33 Had a fall or experienced change in No Accompanied By: son and daughter in law activities of daily living that may affect Transfer Assistance: Manual risk of falls: Patient Identification Verified: Yes Signs or symptoms of abuse/neglect since last visito No Patient Requires Transmission-Based Precautions: No Hospitalized since last visit: No Patient Has Alerts: Yes Implantable device outside of the clinic excluding No Patient Alerts: ***Hoyer**** cellular tissue based products placed in the center since last visit: Has Dressing in Place as Prescribed: Yes Pain Present Now: Yes Electronic Signature(s) Signed: 08/12/2022 5:42:48 PM By: Karie Schwalbe RN Entered By: Karie Schwalbe on 08/12/2022 09:33:47 -------------------------------------------------------------------------------- Encounter Discharge Information Details Patient Name: Date of Service: [name] Docker, [name]. 08/12/2022 9:30 A M Medical Record Number: [id number] Patient Account Number: [id number] Date of Birth/Sex: Treating RN: [birth year] (89 y.o. Arta Silence Primary Care Muzammil Bruins: Eloisa Northern Other Clinician: Referring Teleshia Lemere: Treating Cambree Hendrix/Extender: Gaynelle Arabian in Treatment: 9 Encounter Discharge Information Items Post  Procedure Vitals Discharge Condition: Stable Temperature (F): 97.3 Ambulatory Status: Wheelchair Pulse (bpm): 60 Discharge Destination: Home Respiratory Rate (breaths/min): 18 Transportation: Private Auto Blood Pressure (mmHg): 142/85 Accompanied By: family Schedule Follow-up Appointment: Yes Clinical Summary of Care: Electronic Signature(s) Signed: 08/13/2022 5:31:32 PM By: Shawn Stall RN, BSN Entered By: Shawn Stall on 08/12/2022 10:02:38 [name] ([id number]) [id number] Page 2 of 10 -------------------------------------------------------------------------------- Lower Extremity Assessment Details Patient Name: Date of Service: [name] 08/12/2022 9:30 A M Medical Record Number: [id number] Patient Account Number: [id number] Date of Birth/Sex: Treating RN: 02-15-1923 (89 y.o. [name] Primary Care Elanie Hammitt: Eloisa Northern Other Clinician: Referring Janelli Welling: Treating Bryer Gottsch/Extender: Bess Kinds Weeks in Treatment: 9 Edema Assessment Assessed: [Left: No] [Right: No] [Left: Edema] [Right: :] Calf Left: Right: Point of Measurement: From Medial Instep 40 cm Ankle Left: Right: Point of Measurement: From Medial Instep 24.5 cm Electronic Signature(s) Signed: 08/12/2022 5:42:48 PM By: Karie Schwalbe RN Entered By: Karie Schwalbe on 08/12/2022 09:40:01 -------------------------------------------------------------------------------- Multi-Disciplinary Care Plan Details Patient Name: Date of Service: [name] Docker, [name]. 08/12/2022 9:30 A M Medical Record Number: [id number] Patient Account Number: [id number] Date of Birth/Sex: Treating RN: 1922-08-02 (89 y.o. Debara Pickett, Millard.Loa Primary Care Nyia Tsao: Eloisa Northern Other Clinician: Referring Kyllie Pettijohn: Treating Dejon Lukas/Extender: Gaynelle Arabian in Treatment: 9 Active Inactive Pressure Nursing Diagnoses: Knowledge deficit related to causes and  risk factors for pressure ulcer development Knowledge deficit related to management of pressures ulcers Potential for impaired tissue integrity related to pressure, friction, moisture, and shear Goals: Patient will remain free from development of additional pressure ulcers Date Initiated: 06/10/2022 Target Resolution Date: 09/30/2022 Goal Status: Active Patient will remain free of pressure ulcers Date Initiated: 06/10/2022 Target Resolution Date: 09/30/2022 Goal Status: Active Patient/caregiver will verbalize risk factors for pressure ulcer development Date Initiated: 06/10/2022 Target Resolution Date: 09/30/2022 Goal Status: Active Patient/caregiver will verbalize understanding of pressure ulcer management Date Initiated: 06/10/2022 Target Resolution  Date: 09/30/2022 Goal Status: Active [name] ([id number]) [id number] Page 3 of 10 Interventions: Assess: immobility, friction, shearing, incontinence upon admission and as needed Assess offloading mechanisms upon admission and as needed Assess potential for pressure ulcer upon admission and as needed Provide education on pressure ulcers Notes: Tissue Oxygenation Nursing Diagnoses: Actual ineffective tissue perfusion; peripheral (select once diagnosis is confirmed) Goals: Non-invasive arterial studies are completed as ordered Date Initiated: 08/12/2022 Target Resolution Date: 08/12/2022 Goal Status: Active Interventions: Assess patient understanding of disease process and management upon diagnosis and as needed Assess peripheral arterial status upon admission and as needed Provide education on tissue oxygenation and ischemia Notes: Wound/Skin Impairment Nursing Diagnoses: Impaired tissue integrity Knowledge deficit related to ulceration/compromised skin integrity Goals: Patient/caregiver will verbalize understanding of skin care regimen Date Initiated: 06/10/2022 Target Resolution Date:  09/30/2022 Goal Status: Active Ulcer/skin breakdown will have a volume reduction of 30% by week 4 Date Initiated: 06/10/2022 Target Resolution Date: 09/30/2022 Goal Status: Active Interventions: Assess patient/caregiver ability to obtain necessary supplies Assess patient/caregiver ability to perform ulcer/skin care regimen upon admission and as needed Assess ulceration(s) every visit Provide education on ulcer and skin care Notes: Electronic Signature(s) Signed: 08/13/2022 5:31:32 PM By: Shawn Stall RN, BSN Entered By: Shawn Stall on 08/12/2022 09:56:12 -------------------------------------------------------------------------------- Pain Assessment Details Patient Name: Date of Service: [name] Docker, [name]. 08/12/2022 9:30 A M Medical Record Number: [id number] Patient Account Number: [id number] Date of Birth/Sex: Treating RN: 12-26-1922 (89 y.o. [name] Primary Care Nethra Mehlberg: Eloisa Northern Other Clinician: Referring Kerina Simoneau: Treating Lilyahna Sirmon/Extender: Gaynelle Arabian in Treatment: 9 Active Problems Location of Pain Severity and Description of Pain Patient Has Paino No Site Locations [place], [place] [name] ([id number]) [id number] Page 4 of 10 Pain Management and Medication Current Pain Management: Electronic Signature(s) Signed: 08/12/2022 5:42:48 PM By: Karie Schwalbe RN Entered By: Karie Schwalbe on 08/12/2022 09:39:41 -------------------------------------------------------------------------------- Patient/Caregiver Education Details Patient Name: Date of Service: Illene Bolus 6/12/2024andnbsp9:30 A M Medical Record Number: [id number] Patient Account Number: [id number] Date of Birth/Gender: Treating RN: [birth year] (89 y.o. Arta Silence Primary Care Physician: Eloisa Northern Other Clinician: Referring Physician: Treating Physician/Extender: Gaynelle Arabian in Treatment: 9 Education  Assessment Education Provided To: Patient and Caregiver family Education Topics Provided Wound/Skin Impairment: Handouts: Caring for Your Ulcer Methods: Explain/Verbal Responses: Reinforcements needed Electronic Signature(s) Signed: 08/13/2022 5:31:32 PM By: Shawn Stall RN, BSN Entered By: Shawn Stall on 08/12/2022 09:56:29 -------------------------------------------------------------------------------- Wound Assessment Details Patient Name: Date of Service: [name]. 08/12/2022 9:30 A M Medical Record Number: [id number] Patient Account Number: [id number] Date of Birth/Sex: Treating RN: Jul 09, 1922 (89 y.o. [name] Primary Care Indianna Boran: Eloisa Northern Other Clinician: JACQUITA, GIANNINI ([id number]) [id number] Page 5 of 10 Referring Viraj Liby: Treating Vivienne Sangiovanni/Extender: Bess Kinds Weeks in Treatment: 9 Wound Status Wound Number: 2 Primary Pressure Ulcer Etiology: Wound Location: Right, Posterior Lower Leg Wound Open Wounding Event: Pressure Injury Status: Date Acquired: 05/27/2022 Comorbid Cataracts, Chronic Obstructive Pulmonary Disease (COPD), Weeks Of Treatment: 9 History: Arrhythmia, Hypertension, Osteoarthritis, Received Radiation Clustered Wound: No Photos Wound Measurements Length: (cm) 7.9 Width: (cm) 5 Depth: (cm) 0.1 Area: (cm) 31.023 Volume: (cm) 3.102 % Reduction in Area: -7.6% % Reduction in Volume: -7.6% Epithelialization: None Tunneling: No Undermining: No Wound Description Classification: Unstageable/Unclassified Exudate Amount: Medium Exudate Type: Serosanguineous Exudate Color: red, brown Foul Odor After Cleansing: No Slough/Fibrino Yes Wound Bed Granulation Amount: Small (1-33%) Granulation Quality: Red, Hyper-granulation Necrotic Amount: Large (  67-100%) Necrotic Quality: Eschar, Adherent Slough Periwound Skin Texture Texture Color No Abnormalities Noted: No No Abnormalities  Noted: Yes Scarring: Yes Moisture No Abnormalities Noted: Yes Treatment Notes Wound #2 (Lower Leg) Wound Laterality: Right, Posterior Cleanser Peri-Wound Care Topical Betadine Discharge Instruction: Paint Betadine on wounds Primary Dressing Secondary Dressing ABD Pad, 8x10 Discharge Instruction: Apply over primary dressing as directed. Secured With [employer], 4.5x3.1 (in/yd) Discharge Instruction: Secure with Kerlix as directed. 65M Medipore H Soft Cloth Surgical Tape, 4 x 10 (in/yd) [place], Melayna [name] ([id number]) [id number] Page 6 of 10 Discharge Instruction: Secure with tape as directed. Compression Wrap Compression Stockings Add-Ons Electronic Signature(s) Signed: 08/12/2022 5:42:48 PM By: Karie Schwalbe RN Signed: 08/13/2022 5:31:32 PM By: Shawn Stall RN, BSN Entered By: Shawn Stall on 08/12/2022 09:41:27 -------------------------------------------------------------------------------- Wound Assessment Details Patient Name: Date of Service: [name] Docker, [name]. 08/12/2022 9:30 A M Medical Record Number: [id number] Patient Account Number: [id number] Date of Birth/Sex: Treating RN: 1922/04/17 (89 y.o. [name] Primary Care Abhinav Mayorquin: Eloisa Northern Other Clinician: Referring Ladarrius Bogdanski: Treating Millan Legan/Extender: Bess Kinds Weeks in Treatment: 9 Wound Status Wound Number: 3 Primary Pressure Ulcer Etiology: Wound Location: Right Calcaneus Wound Open Wounding Event: Pressure Injury Status: Date Acquired: 05/27/2022 Comorbid Cataracts, Chronic Obstructive Pulmonary Disease (COPD), Weeks Of Treatment: 9 History: Arrhythmia, Hypertension, Osteoarthritis, Received Radiation Clustered Wound: No Photos Wound Measurements Length: (cm) 9 Width: (cm) 7.5 Depth: (cm) 0.1 Area: (cm) 53.014 Volume: (cm) 5.301 % Reduction in Area: 10% % Reduction in Volume: 10% Epithelialization: None Tunneling: No Undermining:  No Wound Description Classification: Unstageable/Unclassified Exudate Amount: Medium Exudate Type: Serosanguineous Exudate Color: red, brown Foul Odor After Cleansing: No Slough/Fibrino Yes Wound Bed Granulation Amount: None Present (0%) Necrotic Amount: Large (67-100%) Necrotic Quality: Eschar, Adherent Slough Periwound Skin Texture Texture Color No Abnormalities Noted: No No Abnormalities Noted: No Callus: No Atrophie VANN, HEYDE [name] [id number] Page 7 of 10 Crepitus: No Cyanosis: No Excoriation: No Ecchymosis: No Induration: No Erythema: No Rash: No Hemosiderin Staining: No Scarring: No Mottled: No Pallor: No Moisture Rubor: No No Abnormalities Noted: No Dry / Scaly: No Maceration: No Treatment Notes Wound #3 (Calcaneus) Wound Laterality: Right Cleanser Peri-Wound Care Topical Betadine Discharge Instruction: Paint Betadine on wounds Primary Dressing Secondary Dressing ABD Pad, 8x10 Discharge Instruction: Apply over primary dressing as directed. Secured With [employer], 4.5x3.1 (in/yd) Discharge Instruction: Secure with Kerlix as directed. 65M Medipore H Soft Cloth Surgical T ape, 4 x 10 (in/yd) Discharge Instruction: Secure with tape as directed. Compression Wrap Compression Stockings Add-Ons Electronic Signature(s) Signed: 08/12/2022 5:42:48 PM By: Karie Schwalbe RN Signed: 08/13/2022 5:31:32 PM By: Shawn Stall RN, BSN Entered By: Shawn Stall on 08/12/2022 09:41:53 -------------------------------------------------------------------------------- Wound Assessment Details Patient Name: Date of Service: [name] Docker, [name]. 08/12/2022 9:30 A M Medical Record Number: [id number] Patient Account Number: [id number] Date of Birth/Sex: Treating RN: [birth year] (89 y.o. [name] Primary Care Iverson Sees: Eloisa Northern Other Clinician: Referring Starasia Sinko: Treating Ella Golomb/Extender: Bess Kinds Weeks in Treatment: 9 Wound Status Wound Number: 4 Primary Pressure Ulcer Etiology: Wound Location: Right, Dorsal Foot Wound Open Wounding Event: Gradually Appeared Status: Date Acquired: 06/24/2022 Comorbid Cataracts, Chronic Obstructive Pulmonary Disease (COPD), Weeks Of Treatment: 7 History: Arrhythmia, Hypertension, Osteoarthritis, Received Radiation Clustered Wound: Yes Photos LARYSA, MILANI [name] ([id number]) [id number] Page 8 of 10 Wound Measurements Length: (cm) Width: (cm) Depth: (cm) Clustered Quantity: Area: (cm) Volume: (cm) 6.5 % Reduction in Area: 33.5% 9 %  Reduction in Volume: 33.5% 0.1 Epithelialization: None 3 Tunneling: No 45.946 Undermining: No 4.595 Wound Description Classification: Unstageable/Unclassified Wound Margin: Distinct, outline attached Exudate Amount: Medium Exudate Type: Serosanguineous Exudate Color: red, brown Foul Odor After Cleansing: No Slough/Fibrino Yes Wound Bed Granulation Amount: None Present (0%) Exposed Structure Necrotic Amount: Large (67-100%) Fascia Exposed: No Necrotic Quality: Eschar Fat Layer (Subcutaneous Tissue) Exposed: Yes Tendon Exposed: No Muscle Exposed: No Joint Exposed: No Bone Exposed: No Periwound Skin Texture Texture Color No Abnormalities Noted: No No Abnormalities Noted: No Callus: No Atrophie Blanche: No Crepitus: No Cyanosis: No Excoriation: No Ecchymosis: No Induration: No Erythema: No Rash: No Hemosiderin Staining: No Scarring: Yes Mottled: No Pallor: No Moisture Rubor: No No Abnormalities Noted: Yes Temperature / Pain Temperature: No Abnormality Treatment Notes Wound #4 (Foot) Wound Laterality: Dorsal, Right Cleanser Peri-Wound Care Topical Betadine Discharge Instruction: Paint Betadine on wounds Primary Dressing Secondary Dressing ABD Pad, 8x10 Discharge Instruction: Apply over primary dressing as directed. Secured With [employer], 4.5x3.1 (in/yd) Discharge Instruction: Secure with Kerlix as directed. 70M Medipore H Soft Cloth Surgical T ape, 4 x 10 (in/yd) Discharge Instruction: Secure with tape as directed. AGELIKI, WESTLAKE [name] ([id number]) [id number] Page 9 of 10 Compression Wrap Compression Stockings Add-Ons Electronic Signature(s) Signed: 08/12/2022 5:42:48 PM By: Karie Schwalbe RN Signed: 08/13/2022 5:31:32 PM By: Shawn Stall RN, BSN Entered By: Shawn Stall on 08/12/2022 09:43:10 -------------------------------------------------------------------------------- Wound Assessment Details Patient Name: Date of Service: [name] Docker, [name]. 08/12/2022 9:30 A M Medical Record Number: [id number] Patient Account Number: [id number] Date of Birth/Sex: Treating RN: 1922/12/23 (89 y.o. [name] Primary Care Jontay Maston: Eloisa Northern Other Clinician: Referring Raschelle Wisenbaker: Treating Patryck Kilgore/Extender: Bess Kinds Weeks in Treatment: 9 Wound Status Wound Number: 5 Primary Arterial Insufficiency Ulcer Etiology: Wound Location: Left, Medial Foot Wound Open Wounding Event: Gradually Appeared Status: Date Acquired: 08/12/2022 Comorbid Cataracts, Chronic Obstructive Pulmonary Disease (COPD), Weeks Of Treatment: 0 History: Arrhythmia, Hypertension, Osteoarthritis, Received Radiation Clustered Wound: No Photos Wound Measurements Length: (cm) 2 Width: (cm) 2 Depth: (cm) 0.1 Area: (cm) 3.142 Volume: (cm) 0.314 % Reduction in Area: % Reduction in Volume: Epithelialization: None Tunneling: No Undermining: No Wound Description Classification: Unclassifiable Wound Margin: Distinct, outline attached Exudate Amount: None Present Foul Odor After Cleansing: No Slough/Fibrino No Wound Bed Necrotic Amount: Large (67-100%) Exposed Structure Necrotic Quality: Eschar Fascia Exposed: No Fat Layer (Subcutaneous Tissue) Exposed: No Tendon Exposed: No Muscle Exposed:  No Joint Exposed: No Bone Exposed: No Periwound Skin Texture Texture Color No Abnormalities Noted: No No Abnormalities Noted: No DORRIS, RIEHLE [name] ([id number]) [id number] Page 10 of 10 Callus: No Atrophie Blanche: No Crepitus: No Cyanosis: No Excoriation: No Ecchymosis: No Induration: No Erythema: No Rash: No Hemosiderin Staining: No Scarring: No Mottled: No Pallor: No Moisture Rubor: No No Abnormalities Noted: No Dry / Scaly: No Maceration: No Treatment Notes Wound #5 (Foot) Wound Laterality: Left, Medial Cleanser Peri-Wound Care Topical Betadine Discharge Instruction: Paint Betadine on wounds Primary Dressing Secondary Dressing ABD Pad, 8x10 Discharge Instruction: Apply over primary dressing as directed. Secured With [employer], 4.5x3.1 (in/yd) Discharge Instruction: Secure with Kerlix as directed. 70M Medipore H Soft Cloth Surgical T ape, 4 x 10 (in/yd) Discharge Instruction: Secure with tape as directed. Compression Wrap Compression Stockings Add-Ons Electronic Signature(s) Signed: 08/12/2022 5:42:48 PM By: Karie Schwalbe RN Signed: 08/13/2022 5:31:32 PM By: Shawn Stall RN, BSN Entered By: Shawn Stall on 08/12/2022 09:43:37 -------------------------------------------------------------------------------- Vitals Details Patient Name: Date of Service: [name] Docker, Aleeta [name].  08/12/2022 9:30 A M Medical Record Number: [id number] Patient Account Number: [id number] Date of Birth/Sex: Treating RN: September 24, 1922 (89 y.o. [name] Primary Care Fatin Bachicha: Eloisa Northern Other Clinician: Referring Cherry Wittwer: Treating Adi Seales/Extender: Bess Kinds Weeks in Treatment: 9 Vital Signs Time Taken: 09:33 Pulse (bpm): 60 Respiratory Rate (breaths/min): 18 Blood Pressure (mmHg): 142/85 Reference Range: 80 - 120 mg / dl Electronic Signature(s) Signed: 08/12/2022 5:42:48 PM By: Karie Schwalbe RN Entered By: Karie Schwalbe on 08/12/2022 09:39:23

## 2022-08-19 DIAGNOSIS — L89610 Pressure ulcer of right heel, unstageable: Secondary | ICD-10-CM | POA: Diagnosis not present

## 2022-08-19 DIAGNOSIS — L97511 Non-pressure chronic ulcer of other part of right foot limited to breakdown of skin: Secondary | ICD-10-CM | POA: Diagnosis not present

## 2022-08-19 DIAGNOSIS — L89896 Pressure-induced deep tissue damage of other site: Secondary | ICD-10-CM | POA: Diagnosis not present

## 2022-08-19 DIAGNOSIS — L97311 Non-pressure chronic ulcer of right ankle limited to breakdown of skin: Secondary | ICD-10-CM | POA: Diagnosis not present

## 2022-08-26 ENCOUNTER — Encounter (HOSPITAL_BASED_OUTPATIENT_CLINIC_OR_DEPARTMENT_OTHER): Payer: Medicare PPO | Admitting: Physician Assistant

## 2022-08-26 DIAGNOSIS — I480 Paroxysmal atrial fibrillation: Secondary | ICD-10-CM | POA: Diagnosis not present

## 2022-08-26 DIAGNOSIS — L89610 Pressure ulcer of right heel, unstageable: Secondary | ICD-10-CM | POA: Diagnosis not present

## 2022-08-26 DIAGNOSIS — I10 Essential (primary) hypertension: Secondary | ICD-10-CM | POA: Diagnosis not present

## 2022-08-26 DIAGNOSIS — L97818 Non-pressure chronic ulcer of other part of right lower leg with other specified severity: Secondary | ICD-10-CM | POA: Diagnosis not present

## 2022-08-26 DIAGNOSIS — Z7401 Bed confinement status: Secondary | ICD-10-CM | POA: Diagnosis not present

## 2022-08-26 DIAGNOSIS — L89890 Pressure ulcer of other site, unstageable: Secondary | ICD-10-CM | POA: Diagnosis not present

## 2022-08-26 DIAGNOSIS — J449 Chronic obstructive pulmonary disease, unspecified: Secondary | ICD-10-CM | POA: Diagnosis not present

## 2022-08-26 DIAGNOSIS — M1990 Unspecified osteoarthritis, unspecified site: Secondary | ICD-10-CM | POA: Diagnosis not present

## 2022-08-26 NOTE — Progress Notes (Addendum)
[name], [name] ([id number]) [id number] Page 1 of 9 Visit Report for 08/26/2022 Chief Complaint Document Details Patient Name: Date of Service: [name], [name] 08/26/2022 9:30 A M Medical Record Number: [id number] Patient Account Number: [id number] Date of Birth/Sex: Treating RN: 01-04-1923 (89 y.o. F) Primary Care Provider: Eloisa Northern Other Clinician: Referring Provider: Treating Provider/Extender: Gaynelle Arabian in Treatment: 11 Information Obtained from: Patient Chief Complaint Right LE Ulcers Electronic Signature(s) Signed: 08/26/2022 10:28:56 AM By: Allen Derry PA-C Entered By: Allen Derry on 08/26/2022 10:28:56 -------------------------------------------------------------------------------- Debridement Details Patient Name: Date of Service: [name], [name]. 08/26/2022 9:30 A M Medical Record Number: [id number] Patient Account Number: [id number] Date of Birth/Sex: Treating RN: 24-Dec-1922 (89 y.o. [name] Primary Care Provider: Eloisa Northern Other Clinician: Referring Provider: Treating Provider/Extender: Gaynelle Arabian in Treatment: 11 Debridement Performed for Assessment: Wound #2 Right,Posterior Lower Leg Performed By: Physician Lenda Kelp, PA Debridement Type: Chemical/Enzymatic/Mechanical Agent Used: gauze and wound cleanser Level of Consciousness (Pre-procedure): Awake and Alert Pre-procedure Verification/Time Out Yes - 10:30 Taken: Start Time: 10:30 Percent of Wound Bed Debrided: Instrument: Other : gauze and wound cleaner Bleeding: None End Time: 10:32 Procedural Pain: 0 Post Procedural Pain: 0 Response to Treatment: Procedure was tolerated well Level of Consciousness (Post- Awake and Alert procedure): Post Debridement Measurements of Total Wound Length: (cm) 7.8 Stage: Unstageable/Unclassified Width: (cm) 3 Depth: (cm) 0.1 Volume: (cm) 1.838 Character of Wound/Ulcer  Post Debridement: Improved Post Procedure Diagnosis Same as Pre-procedure Notes [name], [name] [name] ([id number]) [id number] Page 2 of 9 Scribed for Allen Derry PA, by [employer]) Signed: 08/26/2022 4:15:09 PM By: Allen Derry PA-C Signed: 08/26/2022 4:55:51 PM By: Karie Schwalbe RN Entered By: Karie Schwalbe on 08/26/2022 10:41:52 -------------------------------------------------------------------------------- HPI Details Patient Name: Date of Service: [name], [name]. 08/26/2022 9:30 A M Medical Record Number: [id number] Patient Account Number: [id number] Date of Birth/Sex: Treating RN: 1922/06/08 (89 y.o. F) Primary Care Provider: Eloisa Northern Other Clinician: Referring Provider: Treating Provider/Extender: Gaynelle Arabian in Treatment: 11 History of Present Illness HPI Description: 01/08/2021 patient presents today for evaluation of a pressure ulcer which began after she fell in July and broke her arm. This unfortunately has led to her being overall more generally weak and she is not really up and moving around much at all. While she was in the hospital she developed the heel ulcers bilaterally the right was more deep tissue injury that pretty much appears to be healed at this point. At the current skilled nursing facility they have actually been doing a great job taking care of her according to her friend who also helps take care of her. Subsequently the patient's wound on the left heel is eschar covered and probably would benefit from clearing away this eschar so that she can actually see some improvements hopefully. Currently they have been utilizing Iodosorb along with a border foam dressing. Patient has a history of hypertension, COPD, generalized muscle weakness, and atrial fibrillation. Due to her age they elected not to place her on any blood thinners for the atrial fibrillation. 01/15/2021 upon evaluation today  patient appears to be doing better in regard to the heel ulcer. I do not have initially upon evaluation today her arterial studies that were done at the facility. Therefore I did not perform any sharp debridement while she was present during the office visit today. Nonetheless I am going to suggest based on what was seen currently  that we probably once we get this with you at everything appears to be okay consider stop debridement at the next visit. 01/29/2021 upon evaluation today patient appears to be doing well with regard to her wound all things considered her blood flow is definitely not very good based on the review of the arterial study although it was really incomplete in my opinion there was not even a brachial reading to compare to but nonetheless the readings in the extremities were very poor. In the end I did actually discuss with her going ahead and going to vascular and they have already get that appointment set up which is great news. Fortunately there does not appear to be signs of infection currently. The wound is really not making tremendous progress but again I would not expect it to do so with where things stand currently until we can get this moving a little bit better direction. 02/19/2021 upon evaluation today patient's wound actually showing signs of doing about the same the still very thick region of eschar noted currently. I do think that based on the fact that there really is nothing from a vascular standpoint recommend currently which I completely understand that this is still something that is going to require as aggressive as we can wound care obviously I am not going to perform any debridement but nonetheless I do think that good wound care including Santyl can be beneficial for the patient currently. 03/12/2021 upon evaluation today patient appears to be doing well with regard to her wound. Fortunately there does not appear to be any signs of active infection at this time  which is great news. No fevers, chills, nausea, vomiting, or diarrhea. I do feel like the [name] is doing a good job although we need to make sure that they are put in a saline moistened gauze and behind to make sure this stays nice and moist it seems to be a little bit dry. 04/02/2021 upon evaluation today patient's wound is actually showing signs of good improvement. Fortunately there does not appear to be any evidence of active infection locally nor systemically at this time which is great news. No fevers, chills, nausea, vomiting, or diarrhea. 04/23/2021 upon evaluation today patient's wound is actually showing signs of being a little bit smaller which is good news and I think that we are headed in the right direction. Obviously this is something that is going to continue to slowly progress I believe and again as long as we keep it clean and keep it from getting infected that is going to be the big thing. 05/14/2021: Today, the wound has contracted even more. There continues to be loose but somewhat thick yellow slough in the wound base. The wound is moist. 06-04-2021 upon evaluation today patient appears to be doing well currently in regard to her heel ulcer. I am actually extremely pleased with where we stand and I think the patient is making good progress here. There does not appear to be any signs of active infection locally or systemically which is great news. 06-25-2021 upon evaluation today patient's wound is actually showing signs of excellent improvement. I am actually very pleased with where we stand and I think the patient is making excellent progress. There does not appear to be any signs of active infection at this time. No fevers, chills, nausea, vomiting, or diarrhea. 07-23-2021 upon evaluation today patient's wound actually appears to be doing quite well. Fortunately there does not appear to be any evidence of active infection  locally nor systemically which is great news. No fevers, chills,  nausea, vomiting, or diarrhea. Readmission: 06-10-2022 upon evaluation today patient presents for initial inspection here in our clinic concerning issues that she has been having with wounds over the right lower extremity. This is part of the leg as well as the ankle region unfortunately there is significant necrosis noted in the way of eschar. Actually did perform a fairly extensive record review through epic today. With that being said unfortunately the patient continues to have a lot of issues here with her arterial flow I did review Dr. [name] note and there does not appear to be any simple way to fix this blood flow. In fact he states that she is not a candidate for revascularization at this point she is looking at basically more palliative care versus a above-knee amputation of this extremity. Again this is an unfortunate situation of being but again at 89 years old I discussed with the patient that I am not sure I would recommend going the extensive route of a amputation surgery especially in light of the fact that she never know if that is going to turn out to be as good is everybody is hoping as it stands anyway. She voiced understanding. With that being said for now she is really not super interested in going with amputation but nonetheless is a little unsure of what exactly to do. [name], [name] ([id number]) [id number] Page 3 of 9 She does I think have a very low chance of getting this to actually heal completely. 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 06-24-2022 upon evaluation today patient appears to be doing poorly still in regard to her leg ulcerations. Fortunately I do not see any signs of infection spreading systemically which is good news. However I do feel like that  locally she may have some infection and notes she is good to be meeting virtually with Dr. Ilsa Iha tomorrow who is an infectious disease specialist. Subsequently also noted that her blood flow was not optimal so our goal right now is more of a palliative option as far as this is concerned were going to try to keep her wounds clean from being infected and hopefully keep her as healthy as possible for as long as possible. 07-29-2022 upon evaluation today patient appears to be doing about the same in regards to the wounds on her leg, ankle, and foot region. This is on the right side. With that being said she did see Dr. Lajoyce Corners to and it was recommended that conservative measures are really the best way to go at this point and he suggested that she continue to come to the wound care center. As far as any other options his only other option would be the potential for an above-knee amputation which I really do not think is the ideal way to go. 08-12-2022 upon evaluation today patient appears to be doing decently well in regard to her wounds all things considered. Fortunately there does not appear to be any signs of active infection at this time which is great news and some of the eschar is slowly started to come off on Monday perform light debridement on the areas today. 08-26-2022 upon evaluation today patient appears to be doing well currently in regard to her wounds which are still maintaining being dry and seems to be doing  decently well all things considered. Fortunately I do not see any signs of active infection locally nor systemically at this time. Electronic Signature(s) Signed: 08/26/2022 10:44:38 AM By: Allen Derry PA-C Entered By: Allen Derry on 08/26/2022 10:44:38 -------------------------------------------------------------------------------- Physical Exam Details Patient Name: Date of Service: [name], [name] 08/26/2022 9:30 A M Medical Record Number: [id number] Patient Account Number:  [id number] Date of Birth/Sex: Treating RN: [birth year] (89 y.o. F) Primary Care Provider: Eloisa Northern Other Clinician: Referring Provider: Treating Provider/Extender: Gaynelle Arabian in Treatment: 11 Constitutional Well-nourished and well-hydrated in no acute distress. Respiratory normal breathing without difficulty. Psychiatric this patient is able to make decisions and demonstrates good insight into disease process. Alert and Oriented x 3. pleasant and cooperative. Notes Upon inspection patient's wound bed actually showed signs of good granulation epithelization at this point. Fortunately I do not see any evidence of worsening overall and in general I do believe that we are moving in the right direction here. Electronic Signature(s) Signed: 08/26/2022 10:44:50 AM By: Allen Derry PA-C Entered By: Allen Derry on 08/26/2022 10:44:50 -------------------------------------------------------------------------------- Physician Orders Details Patient Name: Date of Service: [name] Forth [name]. 08/26/2022 9:30 A M Medical Record Number: [id number] Patient Account Number: [id number] Date of Birth/Sex: Treating RN: 07/03/1922 (89 y.o. [name] Primary Care Provider: Eloisa Northern Other Clinician: Referring Provider: Treating Provider/Extender: Gaynelle Arabian in Treatment: [address], [place] [name] ([id number]) [id number] Page 4 of 9 Verbal / Phone Orders: No Diagnosis Coding ICD-10 Coding Code Description I73.89 Other specified peripheral vascular diseases L97.818 Non-pressure chronic ulcer of other part of right lower leg with other specified severity L89.610 Pressure ulcer of right heel, unstageable I10 Essential (primary) hypertension J44.9 Chronic obstructive pulmonary disease, unspecified M62.81 Muscle weakness (generalized) I48.0 Paroxysmal atrial fibrillation Follow-up Appointments ppointment in 2 weeks. Allen Derry,  PA Room 9 09/09/2022 at 9:30am Return A ***Hoyer*** Do not put right before lunch or at the end of day*** Bathing/ Shower/ Hygiene May shower and wash wound with soap and water. Off-Loading Prevalon Boot - DO NOT USE ANY COMPRESSION TO LEGS Prevalon boots on at all times Wound Treatment Wound #2 - Lower Leg Wound Laterality: Right, Posterior Topical: Betadine 1 x Per Day/30 Days Discharge Instructions: Paint Betadine on wounds Secondary Dressing: ABD Pad, 8x10 1 x Per Day/30 Days Discharge Instructions: Apply over primary dressing as directed. Secured With: [employer], 4.5x3.1 (in/yd) 1 x Per Day/30 Days Discharge Instructions: Secure with Kerlix as directed. Secured With: 57M Medipore H Soft Cloth Surgical T ape, 4 x 10 (in/yd) 1 x Per Day/30 Days Discharge Instructions: Secure with tape as directed. Wound #3 - Calcaneus Wound Laterality: Right Topical: Betadine 1 x Per Day/30 Days Discharge Instructions: Paint Betadine on wounds Secondary Dressing: ABD Pad, 8x10 1 x Per Day/30 Days Discharge Instructions: Apply over primary dressing as directed. Secured With: [employer], 4.5x3.1 (in/yd) 1 x Per Day/30 Days Discharge Instructions: Secure with Kerlix as directed. Secured With: 57M Medipore H Soft Cloth Surgical T ape, 4 x 10 (in/yd) 1 x Per Day/30 Days Discharge Instructions: Secure with tape as directed. Wound #4 - Foot Wound Laterality: Dorsal, Right Topical: Betadine 1 x Per Day/30 Days Discharge Instructions: Paint Betadine on wounds Secondary Dressing: ABD Pad, 8x10 1 x Per Day/30 Days Discharge Instructions: Apply over primary dressing as directed. Secured With: [employer], 4.5x3.1 (in/yd) 1 x Per Day/30 Days Discharge Instructions: Secure with Kerlix as directed. Secured With: [employer]  Medipore H Soft Cloth Surgical T ape, 4 x 10 (in/yd) 1 x Per Day/30 Days Discharge Instructions: Secure with tape as directed. Wound #5 - Foot Wound Laterality: Right,  Medial Topical: Betadine 1 x Per Day/30 Days Discharge Instructions: Paint Betadine on wounds Secondary Dressing: ABD Pad, 8x10 1 x Per Day/30 Days Discharge Instructions: Apply over primary dressing as directed. Secured With: [employer], 4.5x3.1 (in/yd) 1 x Per Day/30 Days [name], [name] ([id number]) [id number] Page 5 of 9 Discharge Instructions: Secure with Kerlix as directed. Secured With: 77M Medipore H Soft Cloth Surgical T ape, 4 x 10 (in/yd) 1 x Per Day/30 Days Discharge Instructions: Secure with tape as directed. Wound #6 - Foot Wound Laterality: Right, Lateral Topical: Betadine 1 x Per Day/30 Days Discharge Instructions: Paint Betadine on wounds Secondary Dressing: ABD Pad, 8x10 1 x Per Day/30 Days Discharge Instructions: Apply over primary dressing as directed. Secured With: [employer], 4.5x3.1 (in/yd) 1 x Per Day/30 Days Discharge Instructions: Secure with Kerlix as directed. Secured With: 77M Medipore H Soft Cloth Surgical T ape, 4 x 10 (in/yd) 1 x Per Day/30 Days Discharge Instructions: Secure with tape as directed. Electronic Signature(s) Signed: 08/26/2022 4:15:09 PM By: Allen Derry PA-C Signed: 08/26/2022 4:55:51 PM By: Karie Schwalbe RN Entered By: Karie Schwalbe on 08/26/2022 10:39:35 -------------------------------------------------------------------------------- Problem List Details Patient Name: Date of Service: [name], [name]. 08/26/2022 9:30 A M Medical Record Number: [id number] Patient Account Number: [id number] Date of Birth/Sex: Treating RN: 1922-06-25 (89 y.o. F) Primary Care Provider: Eloisa Northern Other Clinician: Referring Provider: Treating Provider/Extender: Gaynelle Arabian in Treatment: 11 Active Problems ICD-10 Encounter Code Description Active Date MDM Diagnosis I73.89 Other specified peripheral vascular diseases 06/10/2022 No Yes L97.818 Non-pressure chronic ulcer of other part  of right lower leg with other specified 06/10/2022 No Yes severity L89.610 Pressure ulcer of right heel, unstageable 06/10/2022 No Yes I10 Essential (primary) hypertension 06/10/2022 No Yes J44.9 Chronic obstructive pulmonary disease, unspecified 06/10/2022 No Yes M62.81 Muscle weakness (generalized) 06/10/2022 No Yes I48.0 Paroxysmal atrial fibrillation 06/10/2022 No Yes [name], [name] ([id number]) [phone].pdf Page 6 of 9 Inactive Problems Resolved Problems Electronic Signature(s) Signed: 08/26/2022 10:28:42 AM By: Allen Derry PA-C Entered By: Allen Derry on 08/26/2022 10:28:41 -------------------------------------------------------------------------------- Progress Note Details Patient Name: Date of Service: [name] Forth [name]. 08/26/2022 9:30 A M Medical Record Number: [id number] Patient Account Number: [id number] Date of Birth/Sex: Treating RN: [birth year] (89 y.o. F) Primary Care Provider: Eloisa Northern Other Clinician: Referring Provider: Treating Provider/Extender: Gaynelle Arabian in Treatment: 11 Subjective Chief Complaint Information obtained from Patient Right LE Ulcers History of Present Illness (HPI) 01/08/2021 patient presents today for evaluation of a pressure ulcer which began after she fell in July and broke her arm. This unfortunately has led to her being overall more generally weak and she is not really up and moving around much at all. While she was in the hospital she developed the heel ulcers bilaterally the right was more deep tissue injury that pretty much appears to be healed at this point. At the current skilled nursing facility they have actually been doing a great job taking care of her according to her friend who also helps take care of her. Subsequently the patient's wound on the left heel is eschar covered and probably would benefit from clearing away this eschar so that she can actually see some improvements hopefully.  Currently they have been utilizing Iodosorb along with a border foam  dressing. Patient has a history of hypertension, COPD, generalized muscle weakness, and atrial fibrillation. Due to her age they elected not to place her on any blood thinners for the atrial fibrillation. 01/15/2021 upon evaluation today patient appears to be doing better in regard to the heel ulcer. I do not have initially upon evaluation today her arterial studies that were done at the facility. Therefore I did not perform any sharp debridement while she was present during the office visit today. Nonetheless I am going to suggest based on what was seen currently that we probably once we get this with you at everything appears to be okay consider stop debridement at the next visit. 01/29/2021 upon evaluation today patient appears to be doing well with regard to her wound all things considered her blood flow is definitely not very good based on the review of the arterial study although it was really incomplete in my opinion there was not even a brachial reading to compare to but nonetheless the readings in the extremities were very poor. In the end I did actually discuss with her going ahead and going to vascular and they have already get that appointment set up which is great news. Fortunately there does not appear to be signs of infection currently. The wound is really not making tremendous progress but again I would not expect it to do so with where things stand currently until we can get this moving a little bit better direction. 02/19/2021 upon evaluation today patient's wound actually showing signs of doing about the same the still very thick region of eschar noted currently. I do think that based on the fact that there really is nothing from a vascular standpoint recommend currently which I completely understand that this is still something that is going to require as aggressive as we can wound care obviously I am not going to  perform any debridement but nonetheless I do think that good wound care including Santyl can be beneficial for the patient currently. 03/12/2021 upon evaluation today patient appears to be doing well with regard to her wound. Fortunately there does not appear to be any signs of active infection at this time which is great news. No fevers, chills, nausea, vomiting, or diarrhea. I do feel like the [name] is doing a good job although we need to make sure that they are put in a saline moistened gauze and behind to make sure this stays nice and moist it seems to be a little bit dry. 04/02/2021 upon evaluation today patient's wound is actually showing signs of good improvement. Fortunately there does not appear to be any evidence of active infection locally nor systemically at this time which is great news. No fevers, chills, nausea, vomiting, or diarrhea. 04/23/2021 upon evaluation today patient's wound is actually showing signs of being a little bit smaller which is good news and I think that we are headed in the right direction. Obviously this is something that is going to continue to slowly progress I believe and again as long as we keep it clean and keep it from getting infected that is going to be the big thing. 05/14/2021: Today, the wound has contracted even more. There continues to be loose but somewhat thick yellow slough in the wound base. The wound is moist. 06-04-2021 upon evaluation today patient appears to be doing well currently in regard to her heel ulcer. I am actually extremely pleased with where we stand and I think the patient is making good progress here. There  does not appear to be any signs of active infection locally or systemically which is great news. 06-25-2021 upon evaluation today patient's wound is actually showing signs of excellent improvement. I am actually very pleased with where we stand and I think the patient is making excellent progress. There does not appear to be any signs of  active infection at this time. No fevers, chills, nausea, vomiting, or diarrhea. 07-23-2021 upon evaluation today patient's wound actually appears to be doing quite well. Fortunately there does not appear to be any evidence of active infection locally nor systemically which is great news. No fevers, chills, nausea, vomiting, or diarrhea. [name], [name] ([id number]) [id number] Page 7 of 9 Readmission: 06-10-2022 upon evaluation today patient presents for initial inspection here in our clinic concerning issues that she has been having with wounds over the right lower extremity. This is part of the leg as well as the ankle region unfortunately there is significant necrosis noted in the way of eschar. Actually did perform a fairly extensive record review through epic today. With that being said unfortunately the patient continues to have a lot of issues here with her arterial flow I did review Dr. [name] note and there does not appear to be any simple way to fix this blood flow. In fact he states that she is not a candidate for revascularization at this point she is looking at basically more palliative care versus a above-knee amputation of this extremity. Again this is an unfortunate situation of being but again at 89 years old I discussed with the patient that I am not sure I would recommend going the extensive route of a amputation surgery especially in light of the fact that she never know if that is going to turn out to be as good is everybody is hoping as it stands anyway. She voiced understanding. With that being said for now she is really not super interested in going with amputation but nonetheless is a little unsure of what exactly to do. She does I think have a very low chance of getting this to actually heal completely. 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 06-24-2022 upon evaluation today patient appears to be doing poorly still in regard to her leg ulcerations. Fortunately I do not see any signs of infection spreading systemically which is good news. However I do feel like that locally she may have some infection and notes she is good to be meeting virtually with Dr. Ilsa Iha tomorrow who is an infectious disease specialist. Subsequently also noted that her blood flow was not optimal so our goal right now is more of a palliative option as far as this is concerned were going to try to keep her wounds clean from being infected and hopefully keep her as healthy as possible for as long as possible. 07-29-2022 upon evaluation today patient appears to be doing about the same in regards to the wounds on her leg, ankle, and foot region. This is on the right side. With that being said she did see Dr. Lajoyce Corners to and it was recommended that conservative measures are really the best way to go at this point and he suggested that she continue to come to the wound care center. As far as any other options his only other option would be the potential for an above-knee amputation which I really  do not think is the ideal way to go. 08-12-2022 upon evaluation today patient appears to be doing decently well in regard to her wounds all things considered. Fortunately there does not appear to be any signs of active infection at this time which is great news and some of the eschar is slowly started to come off on Monday perform light debridement on the areas today. 08-26-2022 upon evaluation today patient appears to be doing well currently in regard to her wounds which are still maintaining being dry and seems to be doing decently well all things considered. Fortunately I do not see any signs of active infection locally nor systemically at this time. Objective Constitutional Well-nourished and  well-hydrated in no acute distress. Vitals Time Taken: 10:01 AM, Pulse: 56 bpm, Respiratory Rate: 18 breaths/min, Blood Pressure: 138/76 mmHg. Respiratory normal breathing without difficulty. Psychiatric this patient is able to make decisions and demonstrates good insight into disease process. Alert and Oriented x 3. pleasant and cooperative. General Notes: Upon inspection patient's wound bed actually showed signs of good granulation epithelization at this point. Fortunately I do not see any evidence of worsening overall and in general I do believe that we are moving in the right direction here. Integumentary (Hair, Skin) Wound #2 status is Open. Original cause of wound was Pressure Injury. The date acquired was: 05/27/2022. The wound has been in treatment 11 weeks. The wound is located on the Right,Posterior Lower Leg. The wound measures 7.8cm length x 3cm width x 0.1cm depth; 18.378cm^2 area and 1.838cm^3 volume. There is Fat Layer (Subcutaneous Tissue) exposed. There is no tunneling or undermining noted. There is a medium amount of serosanguineous drainage noted. There is small (1-33%) red, hyper - granulation within the wound bed. There is a large (67-100%) amount of necrotic tissue within the wound bed including Eschar and Adherent Slough. The periwound skin appearance had no abnormalities noted for moisture. The periwound skin appearance had no abnormalities noted for color. The periwound skin appearance exhibited: Scarring. Wound #3 status is Open. Original cause of wound was Pressure Injury. The date acquired was: 05/27/2022. The wound has been in treatment 11 weeks. The wound is located on the Right Calcaneus. The wound measures 9.5cm length x 8cm width x 0.1cm depth; 59.69cm^2 area and 5.969cm^3 volume. There is no tunneling or undermining noted. There is a medium amount of serosanguineous drainage noted. There is small (1-33%) red granulation within the wound bed. There is a large  (67-100%) amount of necrotic tissue within the wound bed including Eschar. The periwound skin appearance did not exhibit: Callus, Crepitus, Excoriation, Induration, Rash, Scarring, Dry/Scaly, Maceration, Atrophie Blanche, Cyanosis, Ecchymosis, Hemosiderin Staining, Mottled, Pallor, Rubor, Erythema. Wound #4 status is Open. Original cause of wound was Gradually Appeared. The date acquired was: 06/24/2022. The wound has been in treatment 9 weeks. The wound is located on the Right,Dorsal Foot. The wound measures 6cm length x 9.4cm width x 0.1cm depth; 44.296cm^2 area and 4.43cm^3 volume. There is Fat Layer (Subcutaneous Tissue) exposed. There is no tunneling or undermining noted. There is a medium amount of serosanguineous drainage noted. The wound margin is distinct with the outline attached to the wound base. There is no granulation within the wound bed. There is a large (67-100%) amount of necrotic tissue within the wound bed including Eschar. The periwound skin appearance had no abnormalities noted for moisture. The periwound skin appearance exhibited: Scarring. The periwound skin appearance did not exhibit: Callus, Crepitus, Excoriation, Induration, Rash, Atrophie Blanche, Cyanosis, Ecchymosis, Hemosiderin  Staining, Mottled, Pallor, Rubor, Erythema. Periwound temperature was noted as No Abnormality. Wound #5 status is Open. Original cause of wound was Gradually Appeared. The date acquired was: 08/12/2022. The wound has been in treatment 2 weeks. The wound is located on the Right,Medial Foot. The wound measures 2cm length x 1.6cm width x 0.1cm depth; 2.513cm^2 area and 0.251cm^3 volume. There is Fat Layer (Subcutaneous Tissue) exposed. There is no tunneling or undermining noted. There is a none present amount of drainage noted. The wound margin is distinct with the outline attached to the wound base. There is a large (67-100%) amount of necrotic tissue within the wound bed including Eschar. The  periwound skin appearance did not exhibit: Callus, Crepitus, Excoriation, Induration, Rash, Scarring, Dry/Scaly, Maceration, Atrophie Blanche, Cyanosis, Ecchymosis, Hemosiderin Staining, Mottled, Pallor, Rubor, Erythema. Wound #6 status is Open. Original cause of wound was Pressure Injury. The date acquired was: 08/19/2022. The wound is located on the Right,Lateral Foot. The wound measures 0.4cm length x 0.5cm width x 0.1cm depth; 0.157cm^2 area and 0.016cm^3 volume. There is Fat Layer (Subcutaneous Tissue) exposed. [name], [name] ([id number]) [id number] Page 8 of 9 There is no tunneling or undermining noted. There is a medium amount of serosanguineous drainage noted. The wound margin is distinct with the outline attached to the wound base. There is no granulation within the wound bed. There is a large (67-100%) amount of necrotic tissue within the wound bed including Eschar. The periwound skin appearance did not exhibit: Callus, Crepitus, Excoriation, Induration, Rash, Scarring, Dry/Scaly, Maceration, Atrophie Blanche, Cyanosis, Ecchymosis, Hemosiderin Staining, Mottled, Pallor, Rubor, Erythema. Assessment Active Problems ICD-10 Other specified peripheral vascular diseases Non-pressure chronic ulcer of other part of right lower leg with other specified severity Pressure ulcer of right heel, unstageable Essential (primary) hypertension Chronic obstructive pulmonary disease, unspecified Muscle weakness (generalized) Paroxysmal atrial fibrillation Procedures Wound #2 Pre-procedure diagnosis of Wound #2 is a Pressure Ulcer located on the Right,Posterior Lower Leg . There was a Chemical/Enzymatic/Mechanical debridement performed by Lenda Kelp, PA. With the following instrument(s): gauze and wound cleaner. Other agent used was gauze and wound cleanser. A time out was conducted at 10:30, prior to the start of the procedure. There was no bleeding. The procedure  was tolerated well with a pain level of 0 throughout and a pain level of 0 following the procedure. Post Debridement Measurements: 7.8cm length x 3cm width x 0.1cm depth; 1.838cm^3 volume. Post debridement Stage noted as Unstageable/Unclassified. Character of Wound/Ulcer Post Debridement is improved. Post procedure Diagnosis Wound #2: Same as Pre-Procedure General Notes: Scribed for Allen Derry PA, by J.Scotton. Plan Follow-up Appointments: Return Appointment in 2 weeks. Allen Derry, PA Room 9 09/09/2022 at 9:30am ***Michiel Sites*** Do not put right before lunch or at the end of day*** Bathing/ Shower/ Hygiene: May shower and wash wound with soap and water. Off-Loading: Prevalon Boot - DO NOT USE ANY COMPRESSION TO LEGS Prevalon boots on at all times WOUND #2: - Lower Leg Wound Laterality: Right, Posterior Topical: Betadine 1 x Per Day/30 Days Discharge Instructions: Paint Betadine on wounds Secondary Dressing: ABD Pad, 8x10 1 x Per Day/30 Days Discharge Instructions: Apply over primary dressing as directed. Secured With: [employer], 4.5x3.1 (in/yd) 1 x Per Day/30 Days Discharge Instructions: Secure with Kerlix as directed. Secured With: 33M Medipore H Soft Cloth Surgical T ape, 4 x 10 (in/yd) 1 x Per Day/30 Days Discharge Instructions: Secure with tape as directed. WOUND #3: - Calcaneus Wound Laterality: Right Topical: Betadine 1 x Per  Day/30 Days Discharge Instructions: Paint Betadine on wounds Secondary Dressing: ABD Pad, 8x10 1 x Per Day/30 Days Discharge Instructions: Apply over primary dressing as directed. Secured With: [employer], 4.5x3.1 (in/yd) 1 x Per Day/30 Days Discharge Instructions: Secure with Kerlix as directed. Secured With: 76M Medipore H Soft Cloth Surgical T ape, 4 x 10 (in/yd) 1 x Per Day/30 Days Discharge Instructions: Secure with tape as directed. WOUND #4: - Foot Wound Laterality: Dorsal, Right Topical: Betadine 1 x Per Day/30 Days Discharge  Instructions: Paint Betadine on wounds Secondary Dressing: ABD Pad, 8x10 1 x Per Day/30 Days Discharge Instructions: Apply over primary dressing as directed. Secured With: [employer], 4.5x3.1 (in/yd) 1 x Per Day/30 Days Discharge Instructions: Secure with Kerlix as directed. Secured With: 76M Medipore H Soft Cloth Surgical T ape, 4 x 10 (in/yd) 1 x Per Day/30 Days Discharge Instructions: Secure with tape as directed. WOUND #5: - Foot Wound Laterality: Right, Medial Topical: Betadine 1 x Per Day/30 Days Discharge Instructions: Paint Betadine on wounds Secondary Dressing: ABD Pad, 8x10 1 x Per Day/30 Days Discharge Instructions: Apply over primary dressing as directed. Secured With: [employer], 4.5x3.1 (in/yd) 1 x Per Day/30 Days Discharge Instructions: Secure with Kerlix as directed. Secured With: 76M Medipore H Soft Cloth Surgical T ape, 4 x 10 (in/yd) 1 x Per Day/30 Days Discharge Instructions: Secure with tape as directed. WOUND #6: - Foot Wound Laterality: Right, Lateral Topical: Betadine 1 x Per Day/30 Days [name], [name] ([id number]) [id number] Page 9 of 9 Discharge Instructions: Paint Betadine on wounds Secondary Dressing: ABD Pad, 8x10 1 x Per Day/30 Days Discharge Instructions: Apply over primary dressing as directed. Secured With: [employer], 4.5x3.1 (in/yd) 1 x Per Day/30 Days Discharge Instructions: Secure with Kerlix as directed. Secured With: 76M Medipore H Soft Cloth Surgical T ape, 4 x 10 (in/yd) 1 x Per Day/30 Days Discharge Instructions: Secure with tape as directed. 1. I am going to suggest that we have the patient continue to monitor for any signs of infection or worsening. Based on what I am seeing I do believe that we are on the right track all things considered her blood flow is not optimal but at the same time we are seeing some minimal improvements here and there. 2. I am good recommend she continue with the  Betadine and dry gauze to the wound areas. 3. And also can recommend that we should continue with appropriate offloading which is being done. We will see patient back for reevaluation in 1 week here in the clinic. If anything worsens or changes patient will contact our office for additional recommendations. Electronic Signature(s) Signed: 08/26/2022 10:45:21 AM By: Allen Derry PA-C Entered By: Allen Derry on 08/26/2022 10:45:20 -------------------------------------------------------------------------------- SuperBill Details Patient Name: Date of Service: [name] Forth [name]. 08/26/2022 Medical Record Number: [id number] Patient Account Number: [id number] Date of Birth/Sex: Treating RN: 03-16-1922 (89 y.o. F) Primary Care Provider: Eloisa Northern Other Clinician: Referring Provider: Treating Provider/Extender: Gaynelle Arabian in Treatment: 11 Diagnosis Coding ICD-10 Codes Code Description I73.89 Other specified peripheral vascular diseases L97.818 Non-pressure chronic ulcer of other part of right lower leg with other specified severity L89.610 Pressure ulcer of right heel, unstageable I10 Essential (primary) hypertension J44.9 Chronic obstructive pulmonary disease, unspecified M62.81 Muscle weakness (generalized) I48.0 Paroxysmal atrial fibrillation Facility Procedures : CPT4 Code: [id number] Description: 99214 - WOUND CARE VISIT-LEV 4 EST PT Modifier: Quantity: 1 Physician Procedures : CPT4 Code Description Modifier [id number] [phone] -  WC PHYS LEVEL 3 - EST PT ICD-10 Diagnosis Description I73.89 Other specified peripheral vascular diseases L97.818 Non-pressure chronic ulcer of other part of right lower leg with other specified severity  L89.610 Pressure ulcer of right heel, unstageable I10 Essential (primary) hypertension Quantity: 1 Electronic Signature(s) Signed: 08/31/2022 12:47:47 PM By: Pearletha Alfred Signed: 08/31/2022 3:12:33 PM By: Allen Derry PA-C Previous Signature:  08/26/2022 3:13:16 PM Version By: Allen Derry PA-C Entered By: Pearletha Alfred on 08/31/2022 12:47:46

## 2022-08-28 NOTE — Progress Notes (Signed)
[name], [name] ([id number]) [id number] Page 1 of 12 Visit Report for 08/26/2022 Arrival Information Details Patient Name: Date of Service: [name], [name] 08/26/2022 9:30 A M Medical Record Number: [id number] Patient Account Number: [id number] Date of Birth/Sex: Treating RN: 19-Apr-1922 (89 y.o. F) Primary Care Jefferie Holston: Eloisa Northern Other Clinician: Referring Shaakira Borrero: Treating Alyda Megna/Extender: Gaynelle Arabian in Treatment: 11 Visit Information History Since Last Visit Added or deleted any medications: No Patient Arrived: Wheel Chair Any new allergies or adverse reactions: No Arrival Time: 09:57 Had a fall or experienced change in No Accompanied By: family activities of daily living that may affect Transfer Assistance: None risk of falls: Patient Identification Verified: Yes Signs or symptoms of abuse/neglect since last visito No Secondary Verification Process Completed: Yes Hospitalized since last visit: No Patient Requires Transmission-Based Precautions: No Implantable device outside of the clinic excluding No Patient Has Alerts: Yes cellular tissue based products placed in the center Patient Alerts: ***Hoyer**** since last visit: Has Dressing in Place as Prescribed: Yes Pain Present Now: No Electronic Signature(s) Signed: 08/28/2022 11:18:15 AM By: Thayer Dallas Entered By: Thayer Dallas on 08/26/2022 10:00:59 -------------------------------------------------------------------------------- Encounter Discharge Information Details Patient Name: Date of Service: [name], [name]. 08/26/2022 9:30 A M Medical Record Number: [id number] Patient Account Number: [id number] Date of Birth/Sex: Treating RN: 1923/02/27 (89 y.o. [name] Primary Care Harlyn Italiano: Eloisa Northern Other Clinician: Referring Hafiz Irion: Treating Meet Weathington/Extender: Gaynelle Arabian in Treatment: 11 Encounter Discharge Information Items  Post Procedure Vitals Discharge Condition: Stable Temperature (F): 97.6 Ambulatory Status: Wheelchair Pulse (bpm): 56 Discharge Destination: Home Respiratory Rate (breaths/min): 18 Transportation: Private Auto Blood Pressure (mmHg): 138/76 Accompanied By: son and daughter-in-law Schedule Follow-up Appointment: Yes Clinical Summary of Care: Patient Declined Electronic Signature(s) Signed: 08/26/2022 4:55:51 PM By: Karie Schwalbe RN Entered By: Karie Schwalbe on 08/26/2022 16:55:09 [name] ([id number]) [id number] Page 2 of 12 -------------------------------------------------------------------------------- Lower Extremity Assessment Details Patient Name: Date of Service: [name], [name] 08/26/2022 9:30 A M Medical Record Number: [id number] Patient Account Number: [id number] Date of Birth/Sex: Treating RN: Nov 19, 1922 (89 y.o. [name] Primary Care Ayeden Gladman: Eloisa Northern Other Clinician: Referring Lameka Disla: Treating Chuckie Mccathern/Extender: Bess Kinds Weeks in Treatment: 11 Edema Assessment Assessed: [Left: No] [Right: No] [Left: Edema] [Right: :] Calf Left: Right: Point of Measurement: From Medial Instep 39 cm Ankle Left: Right: Point of Measurement: From Medial Instep 22.3 cm Vascular Assessment Pulses: Dorsalis Pedis Palpable: [Right:Yes] Electronic Signature(s) Signed: 08/26/2022 4:55:51 PM By: Karie Schwalbe RN Signed: 08/28/2022 11:18:15 AM By: Thayer Dallas Entered By: Thayer Dallas on 08/26/2022 10:30:12 -------------------------------------------------------------------------------- Multi-Disciplinary Care Plan Details Patient Name: Date of Service: [name], [name]. 08/26/2022 9:30 A M Medical Record Number: [id number] Patient Account Number: [id number] Date of Birth/Sex: Treating RN: 30-Oct-1922 (89 y.o. [name] Primary Care Ommie Degeorge: Eloisa Northern Other Clinician: Referring  Marycarmen Hagey: Treating Lindsay Soulliere/Extender: Gaynelle Arabian in Treatment: 11 Active Inactive Pressure Nursing Diagnoses: Knowledge deficit related to causes and risk factors for pressure ulcer development Knowledge deficit related to management of pressures ulcers Potential for impaired tissue integrity related to pressure, friction, moisture, and shear Goals: Patient will remain free from development of additional pressure ulcers Date Initiated: 06/10/2022 Target Resolution Date: 09/30/2023 Goal Status: Active Patient will remain free of pressure ulcers Date Initiated: 06/10/2022 Target Resolution Date: 09/30/2023 [name], [name] ([id number]) [id number] Page 3 of 12 Goal Status: Active Patient/caregiver will verbalize risk factors for pressure ulcer  development Date Initiated: 06/10/2022 Target Resolution Date: 09/30/2023 Goal Status: Active Patient/caregiver will verbalize understanding of pressure ulcer management Date Initiated: 06/10/2022 Target Resolution Date: 09/30/2023 Goal Status: Active Interventions: Assess: immobility, friction, shearing, incontinence upon admission and as needed Assess offloading mechanisms upon admission and as needed Assess potential for pressure ulcer upon admission and as needed Provide education on pressure ulcers Notes: Tissue Oxygenation Nursing Diagnoses: Actual ineffective tissue perfusion; peripheral (select once diagnosis is confirmed) Goals: Non-invasive arterial studies are completed as ordered Date Initiated: 08/12/2022 Target Resolution Date: 08/12/2023 Goal Status: Active Interventions: Assess patient understanding of disease process and management upon diagnosis and as needed Assess peripheral arterial status upon admission and as needed Provide education on tissue oxygenation and ischemia Notes: Wound/Skin Impairment Nursing Diagnoses: Impaired tissue integrity Knowledge deficit related to  ulceration/compromised skin integrity Goals: Patient/caregiver will verbalize understanding of skin care regimen Date Initiated: 06/10/2022 Target Resolution Date: 09/30/2023 Goal Status: Active Ulcer/skin breakdown will have a volume reduction of 30% by week 4 Date Initiated: 06/10/2022 Target Resolution Date: 09/30/2023 Goal Status: Active Interventions: Assess patient/caregiver ability to obtain necessary supplies Assess patient/caregiver ability to perform ulcer/skin care regimen upon admission and as needed Assess ulceration(s) every visit Provide education on ulcer and skin care Notes: Electronic Signature(s) Signed: 08/26/2022 4:55:51 PM By: Karie Schwalbe RN Entered By: Karie Schwalbe on 08/26/2022 10:32:58 -------------------------------------------------------------------------------- Pain Assessment Details Patient Name: Date of Service: [name] Forth [name]. 08/26/2022 9:30 A M Medical Record Number: [id number] Patient Account Number: [id number] Date of Birth/Sex: Treating RN: Mar 01, 1923 (89 y.o. F) Primary Care Morgen Ritacco: Eloisa Northern Other Clinician: Referring Shaquanna Lycan: Treating Tayshon Winker/Extender: Daine Gip, Criss Rosales in Treatment: [address] [place] [name] ([id number]) [id number] Page 4 of 12 Active Problems Location of Pain Severity and Description of Pain Patient Has Paino No Site Locations Pain Management and Medication Current Pain Management: Electronic Signature(s) Signed: 08/28/2022 11:18:15 AM By: Thayer Dallas Entered By: Thayer Dallas on 08/26/2022 10:01:24 -------------------------------------------------------------------------------- Patient/Caregiver Education Details Patient Name: Date of Service: Illene Bolus 6/26/2024andnbsp9:30 A M Medical Record Number: [id number] Patient Account Number: [id number] Date of Birth/Gender: Treating RN: June 10, 1922 (89 y.o. [name] Primary Care Physician: Eloisa Northern  Other Clinician: Referring Physician: Treating Physician/Extender: Gaynelle Arabian in Treatment: 11 Education Assessment Education Provided To: Patient and Caregiver Education Topics Provided Wound/Skin Impairment: Methods: Explain/Verbal Responses: Return demonstration correctly, State content correctly Electronic Signature(s) Signed: 08/26/2022 4:55:51 PM By: Karie Schwalbe RN Entered By: Karie Schwalbe on 08/26/2022 10:37:14 [name] ([id number]) [id number] Page 5 of 12 -------------------------------------------------------------------------------- Wound Assessment Details Patient Name: Date of Service: [name], [name] 08/26/2022 9:30 A M Medical Record Number: [id number] Patient Account Number: [id number] Date of Birth/Sex: Treating RN: [birth year] (89 y.o. F) Primary Care Otha Rickles: Eloisa Northern Other Clinician: Referring Worthington Cruzan: Treating Mariette Cowley/Extender: Bess Kinds Weeks in Treatment: 11 Wound Status Wound Number: 2 Primary Pressure Ulcer Etiology: Wound Location: Right, Posterior Lower Leg Wound Open Wounding Event: Pressure Injury Status: Date Acquired: 05/27/2022 Comorbid Cataracts, Chronic Obstructive Pulmonary Disease (COPD), Weeks Of Treatment: 11 History: Arrhythmia, Hypertension, Osteoarthritis, Received Radiation Clustered Wound: No Photos Wound Measurements Length: (cm) 7.8 Width: (cm) 3 Depth: (cm) 0.1 Area: (cm) 18.378 Volume: (cm) 1.838 % Reduction in Area: 36.3% % Reduction in Volume: 36.3% Epithelialization: None Tunneling: No Undermining: No Wound Description Classification: Unstageable/Unclassified Exudate Amount: Medium Exudate Type: Serosanguineous Exudate Color: red, brown Foul Odor After Cleansing: No Slough/Fibrino Yes Wound Bed Granulation Amount: Small (1-33%) Exposed  Structure Granulation Quality: Red, Hyper-granulation Fat Layer (Subcutaneous Tissue)  Exposed: Yes Necrotic Amount: Large (67-100%) Necrotic Quality: Eschar, Adherent Slough Periwound Skin Texture Texture Color No Abnormalities Noted: No No Abnormalities Noted: Yes Scarring: Yes Moisture No Abnormalities Noted: Yes Treatment Notes Wound #2 (Lower Leg) Wound Laterality: Right, Posterior Cleanser Peri-Wound Care Topical Betadine Discharge Instruction: Paint Betadine on wounds Primary Dressing Secondary Dressing ABD Pad, 8x10 Discharge Instruction: Apply over primary dressing as directed. JAYCIE, UZZO [name] ([id number]) [id number] Page 6 of 12 Secured With [employer], 4.5x3.1 (in/yd) Discharge Instruction: Secure with Kerlix as directed. 82M Medipore H Soft Cloth Surgical T ape, 4 x 10 (in/yd) Discharge Instruction: Secure with tape as directed. Compression Wrap Compression Stockings Add-Ons Electronic Signature(s) Signed: 08/26/2022 4:55:51 PM By: Karie Schwalbe RN Entered By: Karie Schwalbe on 08/26/2022 10:31:03 -------------------------------------------------------------------------------- Wound Assessment Details Patient Name: Date of Service: [name] Forth [name]. 08/26/2022 9:30 A M Medical Record Number: [id number] Patient Account Number: [id number] Date of Birth/Sex: Treating RN: 11/13/1922 (89 y.o. F) Primary Care Willy Vorce: Eloisa Northern Other Clinician: Referring Emerson Schreifels: Treating Circe Chilton/Extender: Bess Kinds Weeks in Treatment: 11 Wound Status Wound Number: 3 Primary Pressure Ulcer Etiology: Wound Location: Right Calcaneus Wound Open Wounding Event: Pressure Injury Status: Date Acquired: 05/27/2022 Comorbid Cataracts, Chronic Obstructive Pulmonary Disease (COPD), Weeks Of Treatment: 11 History: Arrhythmia, Hypertension, Osteoarthritis, Received Radiation Clustered Wound: No Photos Wound Measurements Length: (cm) 9.5 Width: (cm) 8 Depth: (cm) 0.1 Area: (cm) 59.69 Volume: (cm) 5.969 %  Reduction in Area: -1.3% % Reduction in Volume: -1.3% Epithelialization: None Tunneling: No Undermining: No Wound Description Classification: Unstageable/Unclassified Exudate Amount: Medium Exudate Type: Serosanguineous Exudate Color: red, brown Foul Odor After Cleansing: No Slough/Fibrino Yes Wound Bed Granulation Amount: Small (1-33%) Granulation Quality: Red Necrotic Amount: Large (67-100%) Necrotic QualityChiquetta Peeler, Dustina [name] ([id number]) [id number] Page 7 of 12 Periwound Skin Texture Texture Color No Abnormalities Noted: No No Abnormalities Noted: No Callus: No Atrophie Blanche: No Crepitus: No Cyanosis: No Excoriation: No Ecchymosis: No Induration: No Erythema: No Rash: No Hemosiderin Staining: No Scarring: No Mottled: No Pallor: No Moisture Rubor: No No Abnormalities Noted: No Dry / Scaly: No Maceration: No Treatment Notes Wound #3 (Calcaneus) Wound Laterality: Right Cleanser Peri-Wound Care Topical Betadine Discharge Instruction: Paint Betadine on wounds Primary Dressing Secondary Dressing ABD Pad, 8x10 Discharge Instruction: Apply over primary dressing as directed. Secured With [employer], 4.5x3.1 (in/yd) Discharge Instruction: Secure with Kerlix as directed. 82M Medipore H Soft Cloth Surgical T ape, 4 x 10 (in/yd) Discharge Instruction: Secure with tape as directed. Compression Wrap Compression Stockings Add-Ons Electronic Signature(s) Signed: 08/28/2022 11:18:15 AM By: Thayer Dallas Entered By: Thayer Dallas on 08/26/2022 08:65:78 -------------------------------------------------------------------------------- Wound Assessment Details Patient Name: Date of Service: [name], [name]. 08/26/2022 9:30 A M Medical Record Number: [id number] Patient Account Number: [id number] Date of Birth/Sex: Treating RN: [birth year] (89 y.o. F) Primary Care Relena Ivancic: Eloisa Northern Other Clinician: Referring  Elisha Mcgruder: Treating Becket Wecker/Extender: Bess Kinds Weeks in Treatment: 11 Wound Status Wound Number: 4 Primary Pressure Ulcer Etiology: Wound Location: Right, Dorsal Foot Wound Open Wounding Event: Gradually Appeared Status: Date Acquired: 06/24/2022 Comorbid Cataracts, Chronic Obstructive Pulmonary Disease (COPD), Weeks Of Treatment: 9 History: Arrhythmia, Hypertension, Osteoarthritis, Received Radiation Clustered Wound: Yes Photos [name], [name] ([id number]) [id number] Page 8 of 12 Wound Measurements Length: (cm) Width: (cm) Depth: (cm) Clustered Quantity: Area: (cm) Volume: (cm) 6 % Reduction in Area: 35.9% 9.4 % Reduction in Volume: 35.9% 0.1 Epithelialization: None 3  Tunneling: No 44.296 Undermining: No 4.43 Wound Description Classification: Unstageable/Unclassified Wound Margin: Distinct, outline attached Exudate Amount: Medium Exudate Type: Serosanguineous Exudate Color: red, brown Foul Odor After Cleansing: No Slough/Fibrino Yes Wound Bed Granulation Amount: None Present (0%) Exposed Structure Necrotic Amount: Large (67-100%) Fascia Exposed: No Necrotic Quality: Eschar Fat Layer (Subcutaneous Tissue) Exposed: Yes Tendon Exposed: No Muscle Exposed: No Joint Exposed: No Bone Exposed: No Periwound Skin Texture Texture Color No Abnormalities Noted: No No Abnormalities Noted: No Callus: No Atrophie Blanche: No Crepitus: No Cyanosis: No Excoriation: No Ecchymosis: No Induration: No Erythema: No Rash: No Hemosiderin Staining: No Scarring: Yes Mottled: No Pallor: No Moisture Rubor: No No Abnormalities Noted: Yes Temperature / Pain Temperature: No Abnormality Treatment Notes Wound #4 (Foot) Wound Laterality: Dorsal, Right Cleanser Peri-Wound Care Topical Betadine Discharge Instruction: Paint Betadine on wounds Primary Dressing Secondary Dressing ABD Pad, 8x10 Discharge Instruction: Apply over  primary dressing as directed. Secured With [employer], 4.5x3.1 (in/yd) Discharge Instruction: Secure with Kerlix as directed. 44M Medipore H Soft Cloth Surgical T ape, 4 x 10 (in/yd) Discharge Instruction: Secure with tape as directed. [name], [name] ([id number]) [id number] Page 9 of 12 Compression Wrap Compression Stockings Add-Ons Electronic Signature(s) Signed: 08/26/2022 4:55:51 PM By: Karie Schwalbe RN Entered By: Karie Schwalbe on 08/26/2022 10:30:49 -------------------------------------------------------------------------------- Wound Assessment Details Patient Name: Date of Service: [name] Forth [name]. 08/26/2022 9:30 A M Medical Record Number: [id number] Patient Account Number: [id number] Date of Birth/Sex: Treating RN: 1923/02/07 (89 y.o. F) Primary Care Tiearra Colwell: Eloisa Northern Other Clinician: Referring Laberta Wilbon: Treating Demorio Seeley/Extender: Gaynelle Arabian in Treatment: 11 Wound Status Wound Number: 5 Primary Arterial Insufficiency Ulcer Etiology: Wound Location: Right, Medial Foot Wound Open Wounding Event: Gradually Appeared Status: Date Acquired: 08/12/2022 Comorbid Cataracts, Chronic Obstructive Pulmonary Disease (COPD), Weeks Of Treatment: 2 History: Arrhythmia, Hypertension, Osteoarthritis, Received Radiation Clustered Wound: No Photos Wound Measurements Length: (cm) 2 Width: (cm) 1.6 Depth: (cm) 0.1 Area: (cm) 2.513 Volume: (cm) 0.251 % Reduction in Area: 20% % Reduction in Volume: 20.1% Epithelialization: None Tunneling: No Undermining: No Wound Description Classification: Unclassifiable Wound Margin: Distinct, outline attached Exudate Amount: None Present Foul Odor After Cleansing: No Slough/Fibrino No Wound Bed Necrotic Amount: Large (67-100%) Exposed Structure Necrotic Quality: Eschar Fascia Exposed: No Fat Layer (Subcutaneous Tissue) Exposed: Yes Tendon Exposed: No Muscle Exposed:  No Joint Exposed: No Bone Exposed: No Periwound Skin Texture Texture Color No Abnormalities Noted: No No Abnormalities Noted: No Callus: No Atrophie [name], [name] ([id number]) [id number].pdf Page 10 of 12 Crepitus: No Cyanosis: No Excoriation: No Ecchymosis: No Induration: No Erythema: No Rash: No Hemosiderin Staining: No Scarring: No Mottled: No Pallor: No Moisture Rubor: No No Abnormalities Noted: No Dry / Scaly: No Maceration: No Treatment Notes Wound #5 (Foot) Wound Laterality: Right, Medial Cleanser Peri-Wound Care Topical Betadine Discharge Instruction: Paint Betadine on wounds Primary Dressing Secondary Dressing ABD Pad, 8x10 Discharge Instruction: Apply over primary dressing as directed. Secured With [employer], 4.5x3.1 (in/yd) Discharge Instruction: Secure with Kerlix as directed. 44M Medipore H Soft Cloth Surgical T ape, 4 x 10 (in/yd) Discharge Instruction: Secure with tape as directed. Compression Wrap Compression Stockings Add-Ons Electronic Signature(s) Signed: 08/26/2022 4:55:51 PM By: Karie Schwalbe RN Entered By: Karie Schwalbe on 08/26/2022 10:30:29 -------------------------------------------------------------------------------- Wound Assessment Details Patient Name: Date of Service: [name] Forth [name]. 08/26/2022 9:30 A M Medical Record Number: [id number] Patient Account Number: [id number] Date of Birth/Sex: Treating RN: [birth year] (89 y.o. F) Primary Care Vernella Niznik: Nelson Chimes,  [name] Other Clinician: Referring [place] Yearby: Treating Sedalia Greeson/Extender: Bess Kinds Weeks in Treatment: 11 Wound Status Wound Number: 6 Primary Pressure Ulcer Etiology: Wound Location: Right, Lateral Foot Wound Open Wounding Event: Pressure Injury Status: Date Acquired: 08/19/2022 Comorbid Cataracts, Chronic Obstructive Pulmonary Disease (COPD), Weeks Of Treatment: 0 History: Arrhythmia, Hypertension,  Osteoarthritis, Received Radiation Clustered Wound: No Photos [name], [name] ([id number]) [id number] Page 11 of 12 Wound Measurements Length: (cm) 0.4 Width: (cm) 0.5 Depth: (cm) 0.1 Area: (cm) 0.157 Volume: (cm) 0.016 % Reduction in Area: % Reduction in Volume: Epithelialization: None Tunneling: No Undermining: No Wound Description Classification: Unstageable/Unclassified Wound Margin: Distinct, outline attached Exudate Amount: Medium Exudate Type: Serosanguineous Exudate Color: red, brown Foul Odor After Cleansing: No Slough/Fibrino No Wound Bed Granulation Amount: None Present (0%) Exposed Structure Necrotic Amount: Large (67-100%) Fascia Exposed: No Necrotic Quality: Eschar Fat Layer (Subcutaneous Tissue) Exposed: Yes Tendon Exposed: No Muscle Exposed: No Joint Exposed: No Bone Exposed: No Periwound Skin Texture Texture Color No Abnormalities Noted: No No Abnormalities Noted: No Callus: No Atrophie Blanche: No Crepitus: No Cyanosis: No Excoriation: No Ecchymosis: No Induration: No Erythema: No Rash: No Hemosiderin Staining: No Scarring: No Mottled: No Pallor: No Moisture Rubor: No No Abnormalities Noted: No Dry / Scaly: No Maceration: No Treatment Notes Wound #6 (Foot) Wound Laterality: Right, Lateral Cleanser Peri-Wound Care Topical Betadine Discharge Instruction: Paint Betadine on wounds Primary Dressing Secondary Dressing ABD Pad, 8x10 Discharge Instruction: Apply over primary dressing as directed. Secured With [employer], 4.5x3.1 (in/yd) Discharge Instruction: Secure with Kerlix as directed. 72M Medipore H Soft Cloth Surgical T ape, 4 x 10 (in/yd) Discharge Instruction: Secure with tape as directed. [name], TEAHAN [name] ([id number]) [id number] Page 12 of 12 Compression Wrap Compression Stockings Add-Ons Electronic Signature(s) Signed: 08/26/2022 4:55:51 PM By: Karie Schwalbe RN Entered By: Karie Schwalbe on 08/26/2022 10:30:14 -------------------------------------------------------------------------------- Vitals Details Patient Name: Date of Service: [name], [name]. 08/26/2022 9:30 A M Medical Record Number: [id number] Patient Account Number: [id number] Date of Birth/Sex: Treating RN: 03-Jul-1922 (89 y.o. F) Primary Care Ginnie Marich: Eloisa Northern Other Clinician: Referring Willetta York: Treating Younique Casad/Extender: Gaynelle Arabian in Treatment: 11 Vital Signs Time Taken: 10:01 Temperature (F): 97.6 Pulse (bpm): 56 Respiratory Rate (breaths/min): 18 Blood Pressure (mmHg): 138/76 Reference Range: 80 - 120 mg / dl Electronic Signature(s) Signed: 08/26/2022 4:55:51 PM By: Karie Schwalbe RN Entered By: Karie Schwalbe on 08/26/2022 16:55:22

## 2022-09-02 DIAGNOSIS — L97511 Non-pressure chronic ulcer of other part of right foot limited to breakdown of skin: Secondary | ICD-10-CM | POA: Diagnosis not present

## 2022-09-02 DIAGNOSIS — L89610 Pressure ulcer of right heel, unstageable: Secondary | ICD-10-CM | POA: Diagnosis not present

## 2022-09-02 DIAGNOSIS — L89896 Pressure-induced deep tissue damage of other site: Secondary | ICD-10-CM | POA: Diagnosis not present

## 2022-09-02 DIAGNOSIS — L97311 Non-pressure chronic ulcer of right ankle limited to breakdown of skin: Secondary | ICD-10-CM | POA: Diagnosis not present

## 2022-09-09 ENCOUNTER — Encounter (HOSPITAL_BASED_OUTPATIENT_CLINIC_OR_DEPARTMENT_OTHER): Payer: Medicare PPO | Attending: Physician Assistant | Admitting: Physician Assistant

## 2022-09-09 DIAGNOSIS — I480 Paroxysmal atrial fibrillation: Secondary | ICD-10-CM | POA: Diagnosis not present

## 2022-09-09 DIAGNOSIS — L89890 Pressure ulcer of other site, unstageable: Secondary | ICD-10-CM | POA: Diagnosis not present

## 2022-09-09 DIAGNOSIS — Z7401 Bed confinement status: Secondary | ICD-10-CM | POA: Diagnosis not present

## 2022-09-09 DIAGNOSIS — J449 Chronic obstructive pulmonary disease, unspecified: Secondary | ICD-10-CM | POA: Diagnosis not present

## 2022-09-09 DIAGNOSIS — L89610 Pressure ulcer of right heel, unstageable: Secondary | ICD-10-CM | POA: Diagnosis not present

## 2022-09-09 DIAGNOSIS — L97512 Non-pressure chronic ulcer of other part of right foot with fat layer exposed: Secondary | ICD-10-CM | POA: Diagnosis not present

## 2022-09-09 DIAGNOSIS — L97818 Non-pressure chronic ulcer of other part of right lower leg with other specified severity: Secondary | ICD-10-CM | POA: Diagnosis not present

## 2022-09-09 DIAGNOSIS — I10 Essential (primary) hypertension: Secondary | ICD-10-CM | POA: Insufficient documentation

## 2022-09-09 DIAGNOSIS — M109 Gout, unspecified: Secondary | ICD-10-CM | POA: Diagnosis not present

## 2022-09-09 DIAGNOSIS — I70235 Atherosclerosis of native arteries of right leg with ulceration of other part of foot: Secondary | ICD-10-CM | POA: Diagnosis not present

## 2022-09-09 DIAGNOSIS — M1990 Unspecified osteoarthritis, unspecified site: Secondary | ICD-10-CM | POA: Insufficient documentation

## 2022-09-09 DIAGNOSIS — L97819 Non-pressure chronic ulcer of other part of right lower leg with unspecified severity: Secondary | ICD-10-CM | POA: Diagnosis not present

## 2022-09-09 NOTE — Progress Notes (Addendum)
[name], [name] ([id number]) [id number] Page 1 of 9 Visit Report for 09/09/2022 Chief Complaint Document Details Patient Name: Date of Service: MACIEL, KEGG 09/09/2022 9:30 A M Medical Record Number: [id number] Patient Account Number: [id number] Date of Birth/Sex: Treating RN: 1922/12/24 (89 y.o. F) Primary Care Provider: Eloisa Northern Other Clinician: Referring Provider: Treating Provider/Extender: Gaynelle Arabian in Treatment: 13 Information Obtained from: Patient Chief Complaint Right LE Ulcers Electronic Signature(s) Signed: 09/09/2022 9:39:29 AM By: Allen Derry PA-C Entered By: Allen Derry on 09/09/2022 09:39:29 -------------------------------------------------------------------------------- HPI Details Patient Name: Date of Service: [name]. 09/09/2022 9:30 A M Medical Record Number: [id number] Patient Account Number: [id number] Date of Birth/Sex: Treating RN: 1922-07-03 (89 y.o. F) Primary Care Provider: Eloisa Northern Other Clinician: Referring Provider: Treating Provider/Extender: Gaynelle Arabian in Treatment: 13 History of Present Illness HPI Description: 01/08/2021 patient presents today for evaluation of a pressure ulcer which began after she fell in July and broke her arm. This unfortunately has led to her being overall more generally weak and she is not really up and moving around much at all. While she was in the hospital she developed the heel ulcers bilaterally the right was more deep tissue injury that pretty much appears to be healed at this point. At the current skilled nursing facility they have actually been doing a great job taking care of her according to her friend who also helps take care of her. Subsequently the patient's wound on the left heel is eschar covered and probably would benefit from clearing away this eschar so that she can actually see some improvements hopefully. Currently  they have been utilizing Iodosorb along with a border foam dressing. Patient has a history of hypertension, COPD, generalized muscle weakness, and atrial fibrillation. Due to her age they elected not to place her on any blood thinners for the atrial fibrillation. 01/15/2021 upon evaluation today patient appears to be doing better in regard to the heel ulcer. I do not have initially upon evaluation today her arterial studies that were done at the facility. Therefore I did not perform any sharp debridement while she was present during the office visit today. Nonetheless I am going to suggest based on what was seen currently that we probably once we get this with you at everything appears to be okay consider stop debridement at the next visit. 01/29/2021 upon evaluation today patient appears to be doing well with regard to her wound all things considered her blood flow is definitely not very good based on the review of the arterial study although it was really incomplete in my opinion there was not even a brachial reading to compare to but nonetheless the readings in the extremities were very poor. In the end I did actually discuss with her going ahead and going to vascular and they have already get that appointment set up which is great news. Fortunately there does not appear to be signs of infection currently. The wound is really not making tremendous progress but again I would not expect it to do so with where things stand currently until we can get this moving a little bit better direction. 02/19/2021 upon evaluation today patient's wound actually showing signs of doing about the same the still very thick region of eschar noted currently. I do think that based on the fact that there really is nothing from a vascular standpoint recommend currently which I completely understand that this is still something that is going  to require as aggressive as we can wound care obviously I am not going to perform  any debridement but nonetheless I do think that good wound care including Santyl can be beneficial for the patient currently. 03/12/2021 upon evaluation today patient appears to be doing well with regard to her wound. Fortunately there does not appear to be any signs of active infection at this time which is great news. No fevers, chills, nausea, vomiting, or diarrhea. I do feel like the [facility] Popper is doing a good job although we need to make sure that they are put in a saline moistened gauze and behind to make sure this stays nice and moist it seems to be a little bit dry. 04/02/2021 upon evaluation today patient's wound is actually showing signs of good improvement. Fortunately there does not appear to be any evidence of [name], SEGLER [name] ([id number]) [id number] Page 2 of 9 active infection locally nor systemically at this time which is great news. No fevers, chills, nausea, vomiting, or diarrhea. 04/23/2021 upon evaluation today patient's wound is actually showing signs of being a little bit smaller which is good news and I think that we are headed in the right direction. Obviously this is something that is going to continue to slowly progress I believe and again as long as we keep it clean and keep it from getting infected that is going to be the big thing. 05/14/2021: Today, the wound has contracted even more. There continues to be loose but somewhat thick yellow slough in the wound base. The wound is moist. 06-04-2021 upon evaluation today patient appears to be doing well currently in regard to her heel ulcer. I am actually extremely pleased with where we stand and I think the patient is making good progress here. There does not appear to be any signs of active infection locally or systemically which is great news. 06-25-2021 upon evaluation today patient's wound is actually showing signs of excellent improvement. I am actually very pleased with where we stand and I think the  patient is making excellent progress. There does not appear to be any signs of active infection at this time. No fevers, chills, nausea, vomiting, or diarrhea. 07-23-2021 upon evaluation today patient's wound actually appears to be doing quite well. Fortunately there does not appear to be any evidence of active infection locally nor systemically which is great news. No fevers, chills, nausea, vomiting, or diarrhea. Readmission: 06-10-2022 upon evaluation today patient presents for initial inspection here in our clinic concerning issues that she has been having with wounds over the right lower extremity. This is part of the leg as well as the ankle region unfortunately there is significant necrosis noted in the way of eschar. Actually did perform a fairly extensive record review through epic today. With that being said unfortunately the patient continues to have a lot of issues here with her arterial flow I did review Dr. [name] note and there does not appear to be any simple way to fix this blood flow. In fact he states that she is not a candidate for revascularization at this point she is looking at basically more palliative care versus a above-knee amputation of this extremity. Again this is an unfortunate situation of being but again at 89 years old I discussed with the patient that I am not sure I would recommend going the extensive route of a amputation surgery especially in light of the fact that she never know if that is going to turn out to be as  good is everybody is hoping as it stands anyway. She voiced understanding. With that being said for now she is really not super interested in going with amputation but nonetheless is a little unsure of what exactly to do. She does I think have a very low chance of getting this to actually heal completely. 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 06-24-2022 upon evaluation today patient appears to be doing poorly still in regard to her leg ulcerations. Fortunately I do not see any signs of infection spreading systemically which is good news. However I do feel like that locally she may have some infection and notes she is good to be meeting virtually with Dr. Ilsa Iha tomorrow who is an infectious disease specialist. Subsequently also noted that her blood flow was not optimal so our goal right now is more of a palliative option as far as this is concerned were going to try to keep her wounds clean from being infected and hopefully keep her as healthy as possible for as long as possible. 07-29-2022 upon evaluation today patient appears to be doing about the same in regards to the wounds on her leg, ankle, and foot region. This is on the right side. With that being said she did see Dr. Lajoyce Corners to and it was recommended that conservative measures are really the best way to go at this point and he suggested that she continue to come to the wound care center. As far as any other options his only other option would be the potential for an above-knee amputation which I really do not think is the ideal way to go. 08-12-2022 upon evaluation today patient appears to be doing decently well in regard to her wounds all things considered. Fortunately there does not appear to be any signs of active infection at this time which is great news and some of the eschar is slowly started to come off on Monday perform light debridement on the areas today. 08-26-2022 upon evaluation today patient appears to be doing well currently in regard to her wounds which are still maintaining being dry and seems to be doing decently well all things considered. Fortunately I do not see any signs of active infection locally nor systemically at this time. 09-09-2022 upon evaluation patient actually appears  to be making good headway towards making some closure and improvements overall in her wounds. Everything is still pretty much really dry although she is continue to have some of the eschar lifting up around the edges of the wound this is exactly what we want to see happen. Obviously the more this that happens the better off she will be I am very pleased in this regard. Electronic Signature(s) Signed: 09/09/2022 1:47:55 PM By: Allen Derry PA-C Previous Signature: 09/09/2022 9:39:35 AM Version By: Allen Derry PA-C Entered By: Allen Derry on 09/09/2022 13:47:55 -------------------------------------------------------------------------------- Physical Exam Details Patient Name: Date of Service: [name]. 09/09/2022 9:30 A M Medical Record Number: [id number] Patient Account Number: [id number] Date of Birth/Sex: Treating RN: [birth year] (89 y.o. F) Primary Care Provider: Eloisa Northern Other Clinician: Referring Provider: Treating Provider/Extender: Gaynelle Arabian in Treatment: 13 Constitutional Well-nourished and well-hydrated in no acute distress. Respiratory normal breathing without difficulty. Psychiatric DORETHEA, STRUBEL ([id number]) [id number] Page 3 of 9 this patient is able to make decisions and demonstrates good insight  into disease process. Alert and Oriented x 3. pleasant and cooperative. Notes Upon inspection patient's wound bed actually showed signs of good granulation and epithelization at this point. Fortunately I do not see any signs of worsening overall and in general I think that she is making excellent headway towards closure. Electronic Signature(s) Signed: 09/09/2022 1:48:08 PM By: Allen Derry PA-C Entered By: Allen Derry on 09/09/2022 13:48:08 -------------------------------------------------------------------------------- Physician Orders Details Patient Name: Date of Service: [name], [name]. 09/09/2022 9:30 A  M Medical Record Number: [id number] Patient Account Number: [id number] Date of Birth/Sex: Treating RN: 09-Jan-1923 (89 y.o. [name] Primary Care Provider: Eloisa Northern Other Clinician: Referring Provider: Treating Provider/Extender: Gaynelle Arabian in Treatment: [phone] Verbal / Phone Orders: No Diagnosis Coding ICD-10 Coding Code Description I73.89 Other specified peripheral vascular diseases L97.818 Non-pressure chronic ulcer of other part of right lower leg with other specified severity L89.610 Pressure ulcer of right heel, unstageable I10 Essential (primary) hypertension J44.9 Chronic obstructive pulmonary disease, unspecified M62.81 Muscle weakness (generalized) I48.0 Paroxysmal atrial fibrillation Follow-up Appointments Return appointment in 3 weeks. Allen Derry PA Room 9 July 31st at 9:30am ***Michiel Sites*** Do not put right before lunch or at the end of day*** Anesthetic Wound #2 Right,Posterior Lower Leg (In clinic) Topical Lidocaine 4% applied to wound bed Wound #3 Right Calcaneus (In clinic) Topical Lidocaine 4% applied to wound bed Wound #4 Right,Dorsal Foot (In clinic) Topical Lidocaine 4% applied to wound bed Wound #5 Right,Medial Foot (In clinic) Topical Lidocaine 4% applied to wound bed Wound #6 Right,Lateral Foot (In clinic) Topical Lidocaine 4% applied to wound bed Bathing/ Shower/ Hygiene May shower and wash wound with soap and water. Off-Loading Prevalon Boot - DO NOT USE ANY COMPRESSION TO LEGS Prevalon boots on at all times- Please keep Prevalon Boots clean. Wound Treatment Wound #2 - Lower Leg Wound Laterality: Right, Posterior Topical: Betadine 1 x Per Day/30 Days Discharge Instructions: Paint Betadine on wounds Secondary Dressing: ABD Pad, 8x10 1 x Per Day/30 Days Discharge Instructions: Apply over primary dressing as directed. KAYLEANA, WAITES [name] ([id number]) [id number] Page 4 of 9 Secured With: [employer], 4.5x3.1 (in/yd) 1 x Per Day/30 Days Discharge Instructions: Secure with Kerlix as directed. Secured With: 1M Medipore H Soft Cloth Surgical T ape, 4 x 10 (in/yd) 1 x Per Day/30 Days Discharge Instructions: Secure with tape as directed. Wound #3 - Calcaneus Wound Laterality: Right Topical: Betadine 1 x Per Day/30 Days Discharge Instructions: Paint Betadine on wounds Secondary Dressing: ABD Pad, 8x10 1 x Per Day/30 Days Discharge Instructions: Apply over primary dressing as directed. Secured With: [employer], 4.5x3.1 (in/yd) 1 x Per Day/30 Days Discharge Instructions: Secure with Kerlix as directed. Secured With: 1M Medipore H Soft Cloth Surgical T ape, 4 x 10 (in/yd) 1 x Per Day/30 Days Discharge Instructions: Secure with tape as directed. Wound #4 - Foot Wound Laterality: Dorsal, Right Topical: Betadine 1 x Per Day/30 Days Discharge Instructions: Paint Betadine on wounds Secondary Dressing: ABD Pad, 8x10 1 x Per Day/30 Days Discharge Instructions: Apply over primary dressing as directed. Secured With: [employer], 4.5x3.1 (in/yd) 1 x Per Day/30 Days Discharge Instructions: Secure with Kerlix as directed. Secured With: 1M Medipore H Soft Cloth Surgical T ape, 4 x 10 (in/yd) 1 x Per Day/30 Days Discharge Instructions: Secure with tape as directed. Wound #5 - Foot Wound Laterality: Right, Medial Topical: Betadine 1 x Per Day/30 Days Discharge Instructions: Paint Betadine on wounds Secondary Dressing:  ABD Pad, 8x10 1 x Per Day/30 Days Discharge Instructions: Apply over primary dressing as directed. Secured With: [employer], 4.5x3.1 (in/yd) 1 x Per Day/30 Days Discharge Instructions: Secure with Kerlix as directed. Secured With: 36M Medipore H Soft Cloth Surgical T ape, 4 x 10 (in/yd) 1 x Per Day/30 Days Discharge Instructions: Secure with tape as directed. Wound #6 - Foot Wound Laterality: Right, Lateral Topical: Betadine 1 x Per Day/30  Days Discharge Instructions: Paint Betadine on wounds Secondary Dressing: ABD Pad, 8x10 1 x Per Day/30 Days Discharge Instructions: Apply over primary dressing as directed. Secured With: [employer], 4.5x3.1 (in/yd) 1 x Per Day/30 Days Discharge Instructions: Secure with Kerlix as directed. Secured With: 36M Medipore H Soft Cloth Surgical T ape, 4 x 10 (in/yd) 1 x Per Day/30 Days Discharge Instructions: Secure with tape as directed. Electronic Signature(s) Signed: 09/09/2022 5:48:19 PM By: Allen Derry PA-C Signed: 09/09/2022 6:19:30 PM By: Karie Schwalbe RN Entered By: Karie Schwalbe on 09/09/2022 10:46:33 Problem List Details -------------------------------------------------------------------------------- [name] ([id number]) [id number] Page 5 of 9 Patient Name: Date of Service: [name], [name] 09/09/2022 9:30 A M Medical Record Number: [id number] Patient Account Number: [id number] Date of Birth/Sex: Treating RN: 1922-10-06 (89 y.o. F) Primary Care Provider: Eloisa Northern Other Clinician: Referring Provider: Treating Provider/Extender: Gaynelle Arabian in Treatment: 13 Active Problems ICD-10 Encounter Code Description Active Date MDM Diagnosis I73.89 Other specified peripheral vascular diseases 06/10/2022 No Yes L97.818 Non-pressure chronic ulcer of other part of right lower leg with other specified 06/10/2022 No Yes severity L89.610 Pressure ulcer of right heel, unstageable 06/10/2022 No Yes I10 Essential (primary) hypertension 06/10/2022 No Yes J44.9 Chronic obstructive pulmonary disease, unspecified 06/10/2022 No Yes M62.81 Muscle weakness (generalized) 06/10/2022 No Yes I48.0 Paroxysmal atrial fibrillation 06/10/2022 No Yes Inactive Problems Resolved Problems Electronic Signature(s) Signed: 09/09/2022 9:39:21 AM By: Allen Derry PA-C Entered By: Allen Derry on 09/09/2022  09:39:21 -------------------------------------------------------------------------------- Progress Note Details Patient Name: Date of Service: [name]. 09/09/2022 9:30 A M Medical Record Number: [id number] Patient Account Number: [id number] Date of Birth/Sex: Treating RN: 1922/06/03 (89 y.o. F) Primary Care Provider: Eloisa Northern Other Clinician: Referring Provider: Treating Provider/Extender: Gaynelle Arabian in Treatment: 13 Subjective Chief Complaint Information obtained from Patient Right LE Ulcers History of Present Illness (HPI) MERCIE, BALSLEY [name] [id number] Page 6 of 9 01/08/2021 patient presents today for evaluation of a pressure ulcer which began after she fell in July and broke her arm. This unfortunately has led to her being overall more generally weak and she is not really up and moving around much at all. While she was in the hospital she developed the heel ulcers bilaterally the right was more deep tissue injury that pretty much appears to be healed at this point. At the current skilled nursing facility they have actually been doing a great job taking care of her according to her friend who also helps take care of her. Subsequently the patient's wound on the left heel is eschar covered and probably would benefit from clearing away this eschar so that she can actually see some improvements hopefully. Currently they have been utilizing Iodosorb along with a border foam dressing. Patient has a history of hypertension, COPD, generalized muscle weakness, and atrial fibrillation. Due to her age they elected not to place her on any blood thinners for the atrial fibrillation. 01/15/2021 upon evaluation today patient appears to be doing better in regard to the heel  ulcer. I do not have initially upon evaluation today her arterial studies that were done at the facility. Therefore I did not perform any sharp debridement  while she was present during the office visit today. Nonetheless I am going to suggest based on what was seen currently that we probably once we get this with you at everything appears to be okay consider stop debridement at the next visit. 01/29/2021 upon evaluation today patient appears to be doing well with regard to her wound all things considered her blood flow is definitely not very good based on the review of the arterial study although it was really incomplete in my opinion there was not even a brachial reading to compare to but nonetheless the readings in the extremities were very poor. In the end I did actually discuss with her going ahead and going to vascular and they have already get that appointment set up which is great news. Fortunately there does not appear to be signs of infection currently. The wound is really not making tremendous progress but again I would not expect it to do so with where things stand currently until we can get this moving a little bit better direction. 02/19/2021 upon evaluation today patient's wound actually showing signs of doing about the same the still very thick region of eschar noted currently. I do think that based on the fact that there really is nothing from a vascular standpoint recommend currently which I completely understand that this is still something that is going to require as aggressive as we can wound care obviously I am not going to perform any debridement but nonetheless I do think that good wound care including Santyl can be beneficial for the patient currently. 03/12/2021 upon evaluation today patient appears to be doing well with regard to her wound. Fortunately there does not appear to be any signs of active infection at this time which is great news. No fevers, chills, nausea, vomiting, or diarrhea. I do feel like the [facility] Popper is doing a good job although we need to make sure that they are put in a saline moistened gauze and behind to make  sure this stays nice and moist it seems to be a little bit dry. 04/02/2021 upon evaluation today patient's wound is actually showing signs of good improvement. Fortunately there does not appear to be any evidence of active infection locally nor systemically at this time which is great news. No fevers, chills, nausea, vomiting, or diarrhea. 04/23/2021 upon evaluation today patient's wound is actually showing signs of being a little bit smaller which is good news and I think that we are headed in the right direction. Obviously this is something that is going to continue to slowly progress I believe and again as long as we keep it clean and keep it from getting infected that is going to be the big thing. 05/14/2021: Today, the wound has contracted even more. There continues to be loose but somewhat thick yellow slough in the wound base. The wound is moist. 06-04-2021 upon evaluation today patient appears to be doing well currently in regard to her heel ulcer. I am actually extremely pleased with where we stand and I think the patient is making good progress here. There does not appear to be any signs of active infection locally or systemically which is great news. 06-25-2021 upon evaluation today patient's wound is actually showing signs of excellent improvement. I am actually very pleased with where we stand and I think the patient is making  excellent progress. There does not appear to be any signs of active infection at this time. No fevers, chills, nausea, vomiting, or diarrhea. 07-23-2021 upon evaluation today patient's wound actually appears to be doing quite well. Fortunately there does not appear to be any evidence of active infection locally nor systemically which is great news. No fevers, chills, nausea, vomiting, or diarrhea. Readmission: 06-10-2022 upon evaluation today patient presents for initial inspection here in our clinic concerning issues that she has been having with wounds over the  right lower extremity. This is part of the leg as well as the ankle region unfortunately there is significant necrosis noted in the way of eschar. Actually did perform a fairly extensive record review through epic today. With that being said unfortunately the patient continues to have a lot of issues here with her arterial flow I did review Dr. [name] note and there does not appear to be any simple way to fix this blood flow. In fact he states that she is not a candidate for revascularization at this point she is looking at basically more palliative care versus a above-knee amputation of this extremity. Again this is an unfortunate situation of being but again at 89 years old I discussed with the patient that I am not sure I would recommend going the extensive route of a amputation surgery especially in light of the fact that she never know if that is going to turn out to be as good is everybody is hoping as it stands anyway. She voiced understanding. With that being said for now she is really not super interested in going with amputation but nonetheless is a little unsure of what exactly to do. She does I think have a very low chance of getting this to actually heal completely. 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 06-24-2022 upon evaluation today patient appears to be doing poorly still in regard to her leg ulcerations. Fortunately I do not see any signs of infection spreading systemically which is good news. However I do feel like that locally she may have some infection and notes she is good to be meeting virtually with Dr. Ilsa Iha tomorrow who is an infectious disease specialist. Subsequently also noted that her blood flow was not optimal so our goal right now is more of a palliative option as far as this is  concerned were going to try to keep her wounds clean from being infected and hopefully keep her as healthy as possible for as long as possible. 07-29-2022 upon evaluation today patient appears to be doing about the same in regards to the wounds on her leg, ankle, and foot region. This is on the right side. With that being said she did see Dr. Lajoyce Corners to and it was recommended that conservative measures are really the best way to go at this point and he suggested that she continue to come to the wound care center. As far as any other options his only other option would be the potential for an above-knee amputation which I really do not think is the ideal way to go. 08-12-2022 upon evaluation today patient appears to be doing decently well in regard to her wounds all things considered. Fortunately there does not appear to be any signs of active infection at this time which is great news and some of the eschar is slowly started  to come off on Monday perform light debridement on the areas today. 08-26-2022 upon evaluation today patient appears to be doing well currently in regard to her wounds which are still maintaining being dry and seems to be doing decently well all things considered. Fortunately I do not see any signs of active infection locally nor systemically at this time. 09-09-2022 upon evaluation patient actually appears to be making good headway towards making some closure and improvements overall in her wounds. Everything is still pretty much really dry although she is continue to have some of the eschar lifting up around the edges of the wound this is exactly what we want to see happen. Obviously the more this that happens the better off she will be I am very pleased in this regard. [name], [name] ([id number]) [id number] Page 7 of 9 Objective Constitutional Well-nourished and well-hydrated in no acute distress. Vitals Time Taken: 9:55 AM, Temperature: 97.6 F,  Pulse: 64 bpm, Respiratory Rate: 16 breaths/min, Blood Pressure: 164/78 mmHg. Respiratory normal breathing without difficulty. Psychiatric this patient is able to make decisions and demonstrates good insight into disease process. Alert and Oriented x 3. pleasant and cooperative. General Notes: Upon inspection patient's wound bed actually showed signs of good granulation and epithelization at this point. Fortunately I do not see any signs of worsening overall and in general I think that she is making excellent headway towards closure. Integumentary (Hair, Skin) Wound #2 status is Open. Original cause of wound was Pressure Injury. The date acquired was: 05/27/2022. The wound has been in treatment 13 weeks. The wound is located on the Right,Posterior Lower Leg. The wound measures 7cm length x 5cm width x 0.1cm depth; 27.489cm^2 area and 2.749cm^3 volume. There is Fat Layer (Subcutaneous Tissue) exposed. There is no tunneling or undermining noted. There is a medium amount of serosanguineous drainage noted. There is small (1-33%) red, hyper - granulation within the wound bed. There is a large (67-100%) amount of necrotic tissue within the wound bed including Eschar and Adherent Slough. The periwound skin appearance had no abnormalities noted for moisture. The periwound skin appearance had no abnormalities noted for color. The periwound skin appearance exhibited: Scarring. Periwound temperature was noted as No Abnormality. Wound #3 status is Open. Original cause of wound was Pressure Injury. The date acquired was: 05/27/2022. The wound has been in treatment 13 weeks. The wound is located on the Right Calcaneus. The wound measures 9.6cm length x 8.2cm width x 0.1cm depth; 61.827cm^2 area and 6.183cm^3 volume. There is no tunneling or undermining noted. There is a medium amount of serosanguineous drainage noted. There is small (1-33%) red granulation within the wound bed. There is a large (67-100%) amount of  necrotic tissue within the wound bed including Eschar. The periwound skin appearance did not exhibit: Callus, Crepitus, Excoriation, Induration, Rash, Scarring, Dry/Scaly, Maceration, Atrophie Blanche, Cyanosis, Ecchymosis, Hemosiderin Staining, Mottled, Pallor, Rubor, Erythema. Wound #4 status is Open. Original cause of wound was Gradually Appeared. The date acquired was: 06/24/2022. The wound has been in treatment 11 weeks. The wound is located on the Right,Dorsal Foot. The wound measures 5.9cm length x 9.3cm width x 0.1cm depth; 43.095cm^2 area and 4.309cm^3 volume. There is Fat Layer (Subcutaneous Tissue) exposed. There is no tunneling or undermining noted. There is a medium amount of serosanguineous drainage noted. The wound margin is distinct with the outline attached to the wound base. There is no granulation within the wound bed. There is a large (67-100%) amount of necrotic tissue within the  wound bed including Eschar. The periwound skin appearance had no abnormalities noted for moisture. The periwound skin appearance exhibited: Scarring. The periwound skin appearance did not exhibit: Callus, Crepitus, Excoriation, Induration, Rash, Atrophie Blanche, Cyanosis, Ecchymosis, Hemosiderin Staining, Mottled, Pallor, Rubor, Erythema. Periwound temperature was noted as No Abnormality. Wound #5 status is Open. Original cause of wound was Gradually Appeared. The date acquired was: 08/12/2022. The wound has been in treatment 4 weeks. The wound is located on the Right,Medial Foot. The wound measures 2cm length x 1.7cm width x 0.1cm depth; 2.67cm^2 area and 0.267cm^3 volume. There is Fat Layer (Subcutaneous Tissue) exposed. There is no tunneling or undermining noted. There is a none present amount of drainage noted. The wound margin is distinct with the outline attached to the wound base. There is no granulation within the wound bed. There is a large (67-100%) amount of necrotic tissue within the wound bed  including Eschar. The periwound skin appearance exhibited: Scarring. The periwound skin appearance did not exhibit: Callus, Crepitus, Excoriation, Induration, Rash, Dry/Scaly, Maceration, Atrophie Blanche, Cyanosis, Ecchymosis, Hemosiderin Staining, Mottled, Pallor, Rubor, Erythema. Wound #6 status is Open. Original cause of wound was Pressure Injury. The date acquired was: 08/19/2022. The wound has been in treatment 2 weeks. The wound is located on the Right,Lateral Foot. The wound measures 0.8cm length x 3cm width x 0.1cm depth; 1.885cm^2 area and 0.188cm^3 volume. There is Fat Layer (Subcutaneous Tissue) exposed. There is no tunneling or undermining noted. There is a medium amount of serosanguineous drainage noted. The wound margin is distinct with the outline attached to the wound base. There is no granulation within the wound bed. There is a large (67-100%) amount of necrotic tissue within the wound bed including Eschar. The periwound skin appearance did not exhibit: Callus, Crepitus, Excoriation, Induration, Rash, Scarring, Dry/Scaly, Maceration, Atrophie Blanche, Cyanosis, Ecchymosis, Hemosiderin Staining, Mottled, Pallor, Rubor, Erythema. Assessment Active Problems ICD-10 Other specified peripheral vascular diseases Non-pressure chronic ulcer of other part of right lower leg with other specified severity Pressure ulcer of right heel, unstageable Essential (primary) hypertension Chronic obstructive pulmonary disease, unspecified Muscle weakness (generalized) Paroxysmal atrial fibrillation Plan Follow-up Appointments: Return appointment in 3 weeks. Allen Derry PA Room 9 July 31st at 9:30am ***Michiel Sites*** Do not put right before lunch or at the end of day*** Anesthetic: Wound #2 Right,Posterior Lower Leg: (In clinic) Topical Lidocaine 4% applied to wound bed Wound #3 Right Calcaneus: (In clinic) Topical Lidocaine 4% applied to wound bed Wound #4 Right,Dorsal FootSABRIYAH, [name]  ([id number]) [id number] Page 8 of 9 (In clinic) Topical Lidocaine 4% applied to wound bed Wound #5 Right,Medial Foot: (In clinic) Topical Lidocaine 4% applied to wound bed Wound #6 Right,Lateral Foot: (In clinic) Topical Lidocaine 4% applied to wound bed Bathing/ Shower/ Hygiene: May shower and wash wound with soap and water. Off-Loading: Prevalon Boot - DO NOT USE ANY COMPRESSION TO LEGS Prevalon boots on at all times- Please keep Prevalon Boots clean. WOUND #2: - Lower Leg Wound Laterality: Right, Posterior Topical: Betadine 1 x Per Day/30 Days Discharge Instructions: Paint Betadine on wounds Secondary Dressing: ABD Pad, 8x10 1 x Per Day/30 Days Discharge Instructions: Apply over primary dressing as directed. Secured With: [employer], 4.5x3.1 (in/yd) 1 x Per Day/30 Days Discharge Instructions: Secure with Kerlix as directed. Secured With: 65M Medipore H Soft Cloth Surgical T ape, 4 x 10 (in/yd) 1 x Per Day/30 Days Discharge Instructions: Secure with tape as directed. WOUND #3: - Calcaneus Wound Laterality: Right Topical: Betadine  1 x Per Day/30 Days Discharge Instructions: Paint Betadine on wounds Secondary Dressing: ABD Pad, 8x10 1 x Per Day/30 Days Discharge Instructions: Apply over primary dressing as directed. Secured With: [employer], 4.5x3.1 (in/yd) 1 x Per Day/30 Days Discharge Instructions: Secure with Kerlix as directed. Secured With: 470M Medipore H Soft Cloth Surgical T ape, 4 x 10 (in/yd) 1 x Per Day/30 Days Discharge Instructions: Secure with tape as directed. WOUND #4: - Foot Wound Laterality: Dorsal, Right Topical: Betadine 1 x Per Day/30 Days Discharge Instructions: Paint Betadine on wounds Secondary Dressing: ABD Pad, 8x10 1 x Per Day/30 Days Discharge Instructions: Apply over primary dressing as directed. Secured With: [employer], 4.5x3.1 (in/yd) 1 x Per Day/30 Days Discharge Instructions: Secure with Kerlix as  directed. Secured With: 470M Medipore H Soft Cloth Surgical T ape, 4 x 10 (in/yd) 1 x Per Day/30 Days Discharge Instructions: Secure with tape as directed. WOUND #5: - Foot Wound Laterality: Right, Medial Topical: Betadine 1 x Per Day/30 Days Discharge Instructions: Paint Betadine on wounds Secondary Dressing: ABD Pad, 8x10 1 x Per Day/30 Days Discharge Instructions: Apply over primary dressing as directed. Secured With: [employer], 4.5x3.1 (in/yd) 1 x Per Day/30 Days Discharge Instructions: Secure with Kerlix as directed. Secured With: 470M Medipore H Soft Cloth Surgical T ape, 4 x 10 (in/yd) 1 x Per Day/30 Days Discharge Instructions: Secure with tape as directed. WOUND #6: - Foot Wound Laterality: Right, Lateral Topical: Betadine 1 x Per Day/30 Days Discharge Instructions: Paint Betadine on wounds Secondary Dressing: ABD Pad, 8x10 1 x Per Day/30 Days Discharge Instructions: Apply over primary dressing as directed. Secured With: [employer], 4.5x3.1 (in/yd) 1 x Per Day/30 Days Discharge Instructions: Secure with Kerlix as directed. Secured With: 470M Medipore H Soft Cloth Surgical T ape, 4 x 10 (in/yd) 1 x Per Day/30 Days Discharge Instructions: Secure with tape as directed. 1. Based on what I am seeing I do believe that the patient would benefit from a continuation of therapy with the Betadine paints to the wounds. Again this is good to be a very slow progress and process but at the same time as long as she keeps showing signs of some improvement I am good to be pleased in that regard. 2. I am recommending that we continue with the Betadine topically which I think is doing really well at this point and again its only drying things up so that it will peel away from the edges and again each time I see that it looks a little bit better. There is no evidence of infection. We will see patient back for reevaluation in 1 week here in the clinic. If anything worsens or changes patient  will contact our office for additional recommendations. Electronic Signature(s) Signed: 09/09/2022 1:48:48 PM By: Allen Derry PA-C Entered By: Allen Derry on 09/09/2022 13:48:48 -------------------------------------------------------------------------------- SuperBill Details Patient Name: Date of Service: [name], [name]. 09/09/2022 Medical Record Number: [id number] Patient Account Number: [id number] Date of Birth/Sex: Treating RN: 24-Sep-1922 (89 y.o. F) Primary Care Provider: Eloisa Northern Other Clinician: Referring Provider: Treating Provider/Extender: Bess Kinds Conner, [place] [name] ([id number]) [id number] Page 9 of 9 Weeks in Treatment: 13 Diagnosis Coding ICD-10 Codes Code Description I73.89 Other specified peripheral vascular diseases L97.818 Non-pressure chronic ulcer of other part of right lower leg with other specified severity L89.610 Pressure ulcer of right heel, unstageable I10 Essential (primary) hypertension J44.9 Chronic obstructive pulmonary disease, unspecified M62.81 Muscle weakness (generalized) I48.0 Paroxysmal  atrial fibrillation Facility Procedures : CPT4 Code: [id number] Description: [phone] - WOUND CARE VISIT-LEV 5 EST PT Modifier: Quantity: 1 Physician Procedures : CPT4 Code Description Modifier [id number] - WC PHYS LEVEL 3 - EST PT ICD-10 Diagnosis Description I73.89 Other specified peripheral vascular diseases L97.818 Non-pressure chronic ulcer of other part of right lower leg with other specified severity  L89.610 Pressure ulcer of right heel, unstageable I10 Essential (primary) hypertension Quantity: 1 Electronic Signature(s) Signed: 09/09/2022 6:19:30 PM By: Karie Schwalbe RN Signed: 09/28/2022 4:49:37 PM By: Allen Derry PA-C Previous Signature: 09/09/2022 1:49:01 PM Version By: Allen Derry PA-C Entered By: Karie Schwalbe on 09/09/2022 18:17:32

## 2022-09-11 NOTE — Progress Notes (Addendum)
JERIANN, SAYRES [name] ([id number]) [id number] Page 1 of 15 Visit Report for 09/09/2022 Arrival Information Details Patient Name: Date of Service: [name], [name] 09/09/2022 9:30 A M Medical Record Number: [id number] Patient Account Number: [id number] Date of Birth/Sex: Treating RN: Oct 25, 1922 (89 y.o. [name] Primary Care Nivea Wojdyla: Eloisa Northern Other Clinician: Referring Cartel Mauss: Treating General Wearing/Extender: Gaynelle Arabian in Treatment: 13 Visit Information History Since Last Visit Added or deleted any medications: No Patient Arrived: Wheel Chair Any new allergies or adverse reactions: No Arrival Time: 09:55 Had a fall or experienced change in No Accompanied By: son and daughter-in-law activities of daily living that may affect Transfer Assistance: Manual risk of falls: Patient Identification Verified: Yes Signs or symptoms of abuse/neglect since last visito No Patient Requires Transmission-Based Precautions: No Hospitalized since last visit: No Patient Has Alerts: Yes Implantable device outside of the clinic excluding No Patient Alerts: ***Hoyer**** cellular tissue based products placed in the center since last visit: Has Dressing in Place as Prescribed: Yes Pain Present Now: Yes Notes Stays in WC.Staff manually moves the leg. Electronic Signature(s) Signed: 09/09/2022 6:19:30 PM By: Karie Schwalbe RN Entered By: Karie Schwalbe on 09/09/2022 09:57:17 -------------------------------------------------------------------------------- Clinic Level of Care Assessment Details Patient Name: Date of Service: [name], [name] 09/09/2022 9:30 A M Medical Record Number: [id number] Patient Account Number: [id number] Date of Birth/Sex: Treating RN: April 07, 1922 (89 y.o. [name] Primary Care Sims Laday: Eloisa Northern Other Clinician: Referring Kiara Mcdowell: Treating Valor Quaintance/Extender: Gaynelle Arabian in  Treatment: 13 Clinic Level of Care Assessment Items TOOL 4 Quantity Score X- 1 0 Use when only an EandM is performed on FOLLOW-UP visit ASSESSMENTS - Nursing Assessment / Reassessment X- 1 10 Reassessment of Co-morbidities (includes updates in patient status) X- 1 5 Reassessment of Adherence to Treatment Plan ASSESSMENTS - Wound and Skin A ssessment / Reassessment []  - 0 Simple Wound Assessment / Reassessment - one wound X- 5 5 Complex Wound Assessment / Reassessment - multiple wounds []  - 0 Dermatologic / Skin Assessment (not related to wound area) ASSESSMENTS - Focused Assessment []  - 0 Circumferential Edema Measurements - multi extremities [name], [name] ([id number]) [id number] Page 2 of 15 []  - 0 Nutritional Assessment / Counseling / Intervention []  - 0 Lower Extremity Assessment (monofilament, tuning fork, pulses) []  - 0 Peripheral Arterial Disease Assessment (using hand held doppler) ASSESSMENTS - Ostomy and/or Continence Assessment and Care []  - 0 Incontinence Assessment and Management []  - 0 Ostomy Care Assessment and Management (repouching, etc.) PROCESS - Coordination of Care []  - 0 Simple Patient / Family Education for ongoing care X- 1 20 Complex (extensive) Patient / Family Education for ongoing care X- 1 10 Staff obtains Chiropractor, Records, T Results / Process Orders est X- 1 10 Staff telephones HHA, Nursing Homes / Clarify orders / etc []  - 0 Routine Transfer to another Facility (non-emergent condition) []  - 0 Routine Hospital Admission (non-emergent condition) []  - 0 New Admissions / Manufacturing engineer / Ordering NPWT Apligraf, etc. , []  - 0 Emergency Hospital Admission (emergent condition) []  - 0 Simple Discharge Coordination X- 1 15 Complex (extensive) Discharge Coordination PROCESS - Special Needs []  - 0 Pediatric / Minor Patient Management []  - 0 Isolation Patient Management []  - 0 Hearing / Language /  Visual special needs []  - 0 Assessment of Community assistance (transportation, D/C planning, etc.) []  - 0 Additional assistance / Altered mentation []  - 0 Support Surface(s) Assessment (bed, cushion, seat, etc.) INTERVENTIONS -  Wound Cleansing / Measurement []  - 0 Simple Wound Cleansing - one wound X- 5 5 Complex Wound Cleansing - multiple wounds X- 1 5 Wound Imaging (photographs - any number of wounds) []  - 0 Wound Tracing (instead of photographs) []  - 0 Simple Wound Measurement - one wound X- 5 5 Complex Wound Measurement - multiple wounds INTERVENTIONS - Wound Dressings []  - 0 Small Wound Dressing one or multiple wounds []  - 0 Medium Wound Dressing one or multiple wounds X- 5 20 Large Wound Dressing one or multiple wounds []  - 0 Application of Medications - topical []  - 0 Application of Medications - injection INTERVENTIONS - Miscellaneous []  - 0 External ear exam []  - 0 Specimen Collection (cultures, biopsies, blood, body fluids, etc.) []  - 0 Specimen(s) / Culture(s) sent or taken to Lab for analysis []  - 0 Patient Transfer (multiple staff / Michiel Sites Lift / Similar devices) []  - 0 Simple Staple / Suture removal (25 or less) []  - 0 Complex Staple / Suture removal (26 or more) [name], [name] ([id number]) [id number] Page 3 of 15 []  - 0 Hypo / Hyperglycemic Management (close monitor of Blood Glucose) []  - 0 Ankle / Brachial Index (ABI) - do not check if billed separately X- 1 5 Vital Signs Has the patient been seen at the hospital within the last three years: Yes Total Score: 255 Level Of Care: New/Established - Level 5 Electronic Signature(s) Signed: 09/09/2022 6:19:30 PM By: Karie Schwalbe RN Entered By: Karie Schwalbe on 09/09/2022 18:17:05 -------------------------------------------------------------------------------- Complex / Palliative Patient Assessment Details Patient Name: Date of Service: [name], [name]  09/09/2022 9:30 A M Medical Record Number: [id number] Patient Account Number: [id number] Date of Birth/Sex: Treating RN: 1922/11/16 (89 y.o. Arta Silence Primary Care Eldred Lievanos: Eloisa Northern Other Clinician: Referring Maelani Yarbro: Treating Ivionna Verley/Extender: Gaynelle Arabian in Treatment: 13 Complex Wound Management Criteria Patient has remarkable or complex co-morbidities requiring medications or treatments that extend wound healing times. Examples: Diabetes mellitus with chronic renal failure or end stage renal disease requiring dialysis Advanced or poorly controlled rheumatoid arthritis Diabetes mellitus and end stage chronic obstructive pulmonary disease Active cancer with current chemo- or radiation therapy arrhythmia, HTN, COPD, PAD, OA, Breast Ca, sarcoidosis limited mobility- nonambulatory. Palliative Wound Management Criteria Care Approach Wound Care Plan: Complex Wound Management Electronic Signature(s) Signed: 09/17/2022 8:15:54 AM By: Shawn Stall RN, BSN Signed: 09/28/2022 4:49:37 PM By: Allen Derry PA-C Entered By: Shawn Stall on 09/17/2022 08:15:54 -------------------------------------------------------------------------------- Encounter Discharge Information Details Patient Name: Date of Service: [name], [name] Burton [name]. 09/09/2022 9:30 A M Medical Record Number: [id number] Patient Account Number: [id number] Date of Birth/Sex: Treating RN: August 20, 1922 (89 y.o. [name] Primary Care Sunita Demond: Eloisa Northern Other Clinician: Referring Adalina Dopson: Treating Larna Capelle/Extender: Gaynelle Arabian in Treatment: 13 Encounter Discharge Information Items Discharge Condition: Stable Ambulatory Status: Wheelchair Discharge Destination: Skilled Nursing Facility Telephoned: No Orders Sent: Yes Transportation: Private Auto Accompanied By: son and daughter-in-law Schedule Follow-up Appointment: Yes Clinical Summary of Care: Patient  BONNY, VANLEEUWEN ([id number]) [id number] Page 4 of 15 Electronic Signature(s) Signed: 09/09/2022 6:19:30 PM By: Karie Schwalbe RN Entered By: Karie Schwalbe on 09/09/2022 18:18:36 -------------------------------------------------------------------------------- Lower Extremity Assessment Details Patient Name: Date of Service: JAKAYLEE, SASAKI 09/09/2022 9:30 A M Medical Record Number: [id number] Patient Account Number: [id number] Date of Birth/Sex: Treating RN: 01-28-1923 (89 y.o. [name] Primary Care Kalisa Girtman: Eloisa Northern Other Clinician: Referring Annalycia Done: Treating Yeiden Frenkel/Extender: Daine Gip, Criss Rosales  in Treatment: 13 Edema Assessment Assessed: [Left: No] [Right: No] [Left: Edema] [Right: :] Calf Left: Right: Point of Measurement: From Medial Instep 39 cm Ankle Left: Right: Point of Measurement: From Medial Instep 23 cm Vascular Assessment Pulses: Dorsalis Pedis Palpable: [Right:Yes] Extremity colors, hair growth, and conditions: Extremity Color: [Right:Pale] Hair Growth on Extremity: [Right:No] Temperature of Extremity: [Right:Warm] Capillary Refill: [Right:> 3 seconds] Dependent Rubor: [Right:No] Blanched when Elevated: [Right:No No] Toe Nail Assessment Left: Right: Thick: Yes Discolored: Yes Deformed: Yes Improper Length and Hygiene: No Electronic Signature(s) Signed: 09/09/2022 6:19:30 PM By: Karie Schwalbe RN Entered By: Karie Schwalbe on 09/09/2022 10:08:13 -------------------------------------------------------------------------------- Multi-Disciplinary Care Plan Details Patient Name: Date of Service: [name], [name] Burton [name]. 09/09/2022 9:30 A M Medical Record Number: [id number] Patient Account Number: [id number] [name], [name] ([id number]) [id number] Page 5 of 15 Date of Birth/Sex: Treating RN: [birth year] (89 y.o. Woodroe Mode, Randa Evens Primary Care Bridget : Other  Clinician: Eloisa Northern Referring Osamu Olguin: Treating Nathania Waldman/Extender: Gaynelle Arabian in Treatment: 13 Active Inactive Pressure Nursing Diagnoses: Knowledge deficit related to causes and risk factors for pressure ulcer development Knowledge deficit related to management of pressures ulcers Potential for impaired tissue integrity related to pressure, friction, moisture, and shear Goals: Patient will remain free from development of additional pressure ulcers Date Initiated: 06/10/2022 Target Resolution Date: 09/30/2023 Goal Status: Active Patient will remain free of pressure ulcers Date Initiated: 06/10/2022 Target Resolution Date: 09/30/2023 Goal Status: Active Patient/caregiver will verbalize risk factors for pressure ulcer development Date Initiated: 06/10/2022 Target Resolution Date: 09/30/2023 Goal Status: Active Patient/caregiver will verbalize understanding of pressure ulcer management Date Initiated: 06/10/2022 Target Resolution Date: 09/30/2023 Goal Status: Active Interventions: Assess: immobility, friction, shearing, incontinence upon admission and as needed Assess offloading mechanisms upon admission and as needed Assess potential for pressure ulcer upon admission and as needed Provide education on pressure ulcers Notes: Tissue Oxygenation Nursing Diagnoses: Actual ineffective tissue perfusion; peripheral (select once diagnosis is confirmed) Goals: Non-invasive arterial studies are completed as ordered Date Initiated: 08/12/2022 Target Resolution Date: 08/12/2023 Goal Status: Active Interventions: Assess patient understanding of disease process and management upon diagnosis and as needed Assess peripheral arterial status upon admission and as needed Provide education on tissue oxygenation and ischemia Notes: Wound/Skin Impairment Nursing Diagnoses: Impaired tissue integrity Knowledge deficit related to ulceration/compromised skin  integrity Goals: Patient/caregiver will verbalize understanding of skin care regimen Date Initiated: 06/10/2022 Target Resolution Date: 09/30/2023 Goal Status: Active Ulcer/skin breakdown will have a volume reduction of 30% by week 4 Date Initiated: 06/10/2022 Target Resolution Date: 09/30/2023 Goal Status: Active Interventions: Assess patient/caregiver ability to obtain necessary supplies Assess patient/caregiver ability to perform ulcer/skin care regimen upon admission and as needed Assess ulceration(s) every visit Provide education on ulcer and skin care Notes: MILANA, SALAY ([id number]) [id number] Page 6 of 15 Electronic Signature(s) Signed: 09/09/2022 6:19:30 PM By: Karie Schwalbe RN Entered By: Karie Schwalbe on 09/09/2022 18:14:35 -------------------------------------------------------------------------------- Pain Assessment Details Patient Name: Date of Service: [name], [name] 09/09/2022 9:30 A M Medical Record Number: [id number] Patient Account Number: [id number] Date of Birth/Sex: Treating RN: 10/01/1922 (89 y.o. [name] Primary Care Kendrick Remigio: Eloisa Northern Other Clinician: Referring Jakya Dovidio: Treating Blessed Girdner/Extender: Gaynelle Arabian in Treatment: 13 Active Problems Location of Pain Severity and Description of Pain Patient Has Paino Yes Site Locations Pain Location: Generalized Pain With Dressing Change: Yes Duration of the Pain. Constant / Intermittento Constant Rate the pain. Current Pain Level: 6 Worst Pain Level: 10 Least Pain  Level: 3 Tolerable Pain Level: 5 Character of Pain Describe the Pain: Difficult to Pinpoint Pain Management and Medication Current Pain Management: Medication: No Cold Application: No Rest: No Massage: No Activity: No T.E.N.S.: No Heat Application: No Leg drop or elevation: No Is the Current Pain Management Adequate: Inadequate How does your wound impact your  activities of daily livingo Sleep: No Bathing: No Appetite: No Relationship With Others: No Bladder Continence: No Emotions: No Bowel Continence: No Work: No Toileting: No Drive: No Dressing: No Hobbies: No Electronic Signature(s) Signed: 09/09/2022 6:19:30 PM By: Karie Schwalbe RN Entered By: Karie Schwalbe on 09/09/2022 09:58:23 [name] ([id number]) [id number] Page 7 of 15 -------------------------------------------------------------------------------- Patient/Caregiver Education Details Patient Name: Date of Service: [name], [name] 7/10/2024andnbsp9:30 A M Medical Record Number: [id number] Patient Account Number: [id number] Date of Birth/Gender: Treating RN: 1923/02/04 (89 y.o. [name] Primary Care Physician: Eloisa Northern Other Clinician: Referring Physician: Treating Physician/Extender: Gaynelle Arabian in Treatment: 13 Education Assessment Education Provided To: Patient Education Topics Provided Wound/Skin Impairment: Methods: Explain/Verbal Responses: Return demonstration correctly Electronic Signature(s) Signed: 09/09/2022 6:19:30 PM By: Karie Schwalbe RN Entered By: Karie Schwalbe on 09/09/2022 18:14:49 -------------------------------------------------------------------------------- Wound Assessment Details Patient Name: Date of Service: [name] Forth [name]. 09/09/2022 9:30 A M Medical Record Number: [id number] Patient Account Number: [id number] Date of Birth/Sex: Treating RN: 08/23/1922 (89 y.o. F) Primary Care Denay Pleitez: Eloisa Northern Other Clinician: Referring Avaley Coop: Treating Kortny Lirette/Extender: Bess Kinds Weeks in Treatment: 13 Wound Status Wound Number: 2 Primary Pressure Ulcer Etiology: Wound Location: Right, Posterior Lower Leg Wound Open Wounding Event: Pressure Injury Status: Date Acquired: 05/27/2022 Comorbid Cataracts, Chronic Obstructive Pulmonary Disease  (COPD), Weeks Of Treatment: 13 History: Arrhythmia, Hypertension, Osteoarthritis, Received Radiation Clustered Wound: No Photos Wound Measurements Length: (cm) 7 Width: (cm) 5 Rajkumar, Aarti [name] ([id number]) Depth: (cm) 0.1 Area: (cm) 27. Volume: (cm) 2.7 % Reduction in Area: 4.7% % Reduction in Volume: 4.7% [id number] Page 8 of 15 Epithelialization: None 489 Tunneling: No 49 Undermining: No Wound Description Classification: Unstageable/Unclassified Exudate Amount: Medium Exudate Type: Serosanguineous Exudate Color: red, brown Foul Odor After Cleansing: No Slough/Fibrino Yes Wound Bed Granulation Amount: Small (1-33%) Exposed Structure Granulation Quality: Red, Hyper-granulation Fat Layer (Subcutaneous Tissue) Exposed: Yes Necrotic Amount: Large (67-100%) Necrotic Quality: Eschar, Adherent Slough Periwound Skin Texture Texture Color No Abnormalities Noted: No No Abnormalities Noted: Yes Scarring: Yes Temperature / Pain Temperature: No Abnormality Moisture No Abnormalities Noted: Yes Treatment Notes Wound #2 (Lower Leg) Wound Laterality: Right, Posterior Cleanser Peri-Wound Care Topical Betadine Discharge Instruction: Paint Betadine on wounds Primary Dressing Secondary Dressing ABD Pad, 8x10 Discharge Instruction: Apply over primary dressing as directed. Secured With [employer], 4.5x3.1 (in/yd) Discharge Instruction: Secure with Kerlix as directed. 48M Medipore H Soft Cloth Surgical T ape, 4 x 10 (in/yd) Discharge Instruction: Secure with tape as directed. Compression Wrap Compression Stockings Add-Ons Electronic Signature(s) Signed: 09/09/2022 6:19:30 PM By: Karie Schwalbe RN Entered By: Karie Schwalbe on 09/09/2022 10:10:29 -------------------------------------------------------------------------------- Wound Assessment Details Patient Name: Date of Service: [name] Forth [name]. 09/09/2022 9:30 A M Medical Record  Number: [id number] Patient Account Number: [id number] Date of Birth/Sex: Treating RN: [birth year] (89 y.o. [name] Primary Care Fynn Vanblarcom: Eloisa Northern Other Clinician: Referring Mihcael Ledee: Treating Buford Gayler/Extender: Bess Kinds Weeks in Treatment: [address] [place], [place] [name] ([id number]) [id number] Page 9 of 15 Wound Number: 3 Primary Pressure Ulcer Etiology: Wound Location: Right Calcaneus Wound Open Wounding Event: Pressure Injury Status:  Date Acquired: 05/27/2022 Comorbid Cataracts, Chronic Obstructive Pulmonary Disease (COPD), Weeks Of Treatment: 13 History: Arrhythmia, Hypertension, Osteoarthritis, Received Radiation Clustered Wound: No Photos Wound Measurements Length: (cm) 9.6 Width: (cm) 8.2 Depth: (cm) 0.1 Area: (cm) 61.827 Volume: (cm) 6.183 % Reduction in Area: -5% % Reduction in Volume: -5% Epithelialization: None Tunneling: No Undermining: No Wound Description Classification: Unstageable/Unclassified Exudate Amount: Medium Exudate Type: Serosanguineous Exudate Color: red, brown Foul Odor After Cleansing: No Slough/Fibrino Yes Wound Bed Granulation Amount: Small (1-33%) Granulation Quality: Red Necrotic Amount: Large (67-100%) Necrotic Quality: Eschar Periwound Skin Texture Texture Color No Abnormalities Noted: No No Abnormalities Noted: No Callus: No Atrophie Blanche: No Crepitus: No Cyanosis: No Excoriation: No Ecchymosis: No Induration: No Erythema: No Rash: No Hemosiderin Staining: No Scarring: No Mottled: No Pallor: No Moisture Rubor: No No Abnormalities Noted: No Dry / Scaly: No Maceration: No Treatment Notes Wound #3 (Calcaneus) Wound Laterality: Right Cleanser Peri-Wound Care Topical Betadine Discharge Instruction: Paint Betadine on wounds Primary Dressing Secondary Dressing ABD Pad, 8x10 Discharge Instruction: Apply over primary dressing as directed. Secured  With [employer], 4.5x3.1 (in/yd) [place], [place] [name] ([id number]) [id number] Page 10 of 15 Discharge Instruction: Secure with Kerlix as directed. 2M Medipore H Soft Cloth Surgical T ape, 4 x 10 (in/yd) Discharge Instruction: Secure with tape as directed. Compression Wrap Compression Stockings Add-Ons Electronic Signature(s) Signed: 09/09/2022 6:19:30 PM By: Karie Schwalbe RN Entered By: Karie Schwalbe on 09/09/2022 10:09:40 -------------------------------------------------------------------------------- Wound Assessment Details Patient Name: Date of Service: [name] Forth [name]. 09/09/2022 9:30 A M Medical Record Number: [id number] Patient Account Number: [id number] Date of Birth/Sex: Treating RN: April 23, 1922 (89 y.o. [name] Primary Care Karlene Southard: Eloisa Northern Other Clinician: Referring Alfhild Partch: Treating Keahi Mccarney/Extender: Gaynelle Arabian in Treatment: 13 Wound Status Wound Number: 4 Primary Pressure Ulcer Etiology: Wound Location: Right, Dorsal Foot Wound Open Wounding Event: Gradually Appeared Status: Date Acquired: 06/24/2022 Comorbid Cataracts, Chronic Obstructive Pulmonary Disease (COPD), Weeks Of Treatment: 11 History: Arrhythmia, Hypertension, Osteoarthritis, Received Radiation Clustered Wound: Yes Photos Wound Measurements Length: (cm) Width: (cm) Depth: (cm) Clustered Quantity: Area: (cm) Volume: (cm) 5.9 % Reduction in Area: 37.6% 9.3 % Reduction in Volume: 37.7% 0.1 Epithelialization: None 3 Tunneling: No 43.095 Undermining: No 4.309 Wound Description Classification: Unstageable/Unclassified Wound Margin: Distinct, outline attached Exudate Amount: Medium Exudate Type: Serosanguineous Exudate Color: red, brown Foul Odor After Cleansing: No Slough/Fibrino Yes Wound Bed Granulation Amount: None Present (0%) Exposed Structure Necrotic Amount: Large (67-100%) Fascia Exposed: No Necrotic  Quality: Eschar Fat Layer (Subcutaneous Tissue) Exposed: Yes Tendon Exposed: No Muscle Exposed: No KSENIYA, GRUNDEN [name] ([id number]) [id number] Page 11 of 15 Joint Exposed: No Bone Exposed: No Periwound Skin Texture Texture Color No Abnormalities Noted: No No Abnormalities Noted: No Callus: No Atrophie Blanche: No Crepitus: No Cyanosis: No Excoriation: No Ecchymosis: No Induration: No Erythema: No Rash: No Hemosiderin Staining: No Scarring: Yes Mottled: No Pallor: No Moisture Rubor: No No Abnormalities Noted: Yes Temperature / Pain Temperature: No Abnormality Treatment Notes Wound #4 (Foot) Wound Laterality: Dorsal, Right Cleanser Peri-Wound Care Topical Betadine Discharge Instruction: Paint Betadine on wounds Primary Dressing Secondary Dressing ABD Pad, 8x10 Discharge Instruction: Apply over primary dressing as directed. Secured With [employer], 4.5x3.1 (in/yd) Discharge Instruction: Secure with Kerlix as directed. 2M Medipore H Soft Cloth Surgical T ape, 4 x 10 (in/yd) Discharge Instruction: Secure with tape as directed. Compression Wrap Compression Stockings Add-Ons Electronic Signature(s) Signed: 09/09/2022 6:19:30 PM By: Karie Schwalbe RN Signed: 09/11/2022 1:10:34 PM By: Louanne Skye,  Durward Fortes By: Thayer Dallas on 09/09/2022 10:06:21 -------------------------------------------------------------------------------- Wound Assessment Details Patient Name: Date of Service: [name], [name] 09/09/2022 9:30 A M Medical Record Number: [id number] Patient Account Number: [id number] Date of Birth/Sex: Treating RN: [birth year] (89 y.o. [name] Primary Care Carlynn Leduc: Eloisa Northern Other Clinician: Referring Jayin Derousse: Treating Nakai Yard/Extender: Bess Kinds Weeks in Treatment: 13 Wound Status Wound Number: 5 Primary Arterial Insufficiency Ulcer Etiology: Wound Location: Right, Medial Foot Wound  Open Wounding Event: Gradually Appeared Status: Date Acquired: 08/12/2022 Comorbid Cataracts, Chronic Obstructive Pulmonary Disease (COPD), Weeks Of Treatment: 4 History: Arrhythmia, Hypertension, Osteoarthritis, Received Radiation Clustered Wound: No ZHARA, GIESKE [name] ([id number]) [id number] Page 12 of 15 Photos Wound Measurements Length: (cm) 2 Width: (cm) 1.7 Depth: (cm) 0.1 Area: (cm) 2.67 Volume: (cm) 0.267 % Reduction in Area: 15% % Reduction in Volume: 15% Epithelialization: None Tunneling: No Undermining: No Wound Description Classification: Unclassifiable Wound Margin: Distinct, outline attached Exudate Amount: None Present Foul Odor After Cleansing: No Slough/Fibrino No Wound Bed Granulation Amount: None Present (0%) Exposed Structure Necrotic Amount: Large (67-100%) Fascia Exposed: No Necrotic Quality: Eschar Fat Layer (Subcutaneous Tissue) Exposed: Yes Tendon Exposed: No Muscle Exposed: No Joint Exposed: No Bone Exposed: No Periwound Skin Texture Texture Color No Abnormalities Noted: No No Abnormalities Noted: No Callus: No Atrophie Blanche: No Crepitus: No Cyanosis: No Excoriation: No Ecchymosis: No Induration: No Erythema: No Rash: No Hemosiderin Staining: No Scarring: Yes Mottled: No Pallor: No Moisture Rubor: No No Abnormalities Noted: No Dry / Scaly: No Maceration: No Treatment Notes Wound #5 (Foot) Wound Laterality: Right, Medial Cleanser Peri-Wound Care Topical Betadine Discharge Instruction: Paint Betadine on wounds Primary Dressing Secondary Dressing ABD Pad, 8x10 Discharge Instruction: Apply over primary dressing as directed. Secured With [employer], 4.5x3.1 (in/yd) Discharge Instruction: Secure with Kerlix as directed. 27M Medipore H Soft Cloth Surgical T ape, 4 x 10 (in/yd) Discharge Instruction: Secure with tape as directed. NAYA, ILAGAN [name] ([id number])  [id number] Page 13 of 15 Compression Wrap Compression Stockings Add-Ons Electronic Signature(s) Signed: 09/09/2022 6:19:30 PM By: Karie Schwalbe RN Signed: 09/11/2022 1:10:34 PM By: Thayer Dallas Entered By: Thayer Dallas on 09/09/2022 10:07:01 -------------------------------------------------------------------------------- Wound Assessment Details Patient Name: Date of Service: [name], [name] Burton [name]. 09/09/2022 9:30 A M Medical Record Number: [id number] Patient Account Number: [id number] Date of Birth/Sex: Treating RN: 1922/04/23 (89 y.o. [name] Primary Care Aryia Delira: Eloisa Northern Other Clinician: Referring Nivan Melendrez: Treating Cecia Egge/Extender: Gaynelle Arabian in Treatment: 13 Wound Status Wound Number: 6 Primary Pressure Ulcer Etiology: Wound Location: Right, Lateral Foot Wound Open Wounding Event: Pressure Injury Status: Date Acquired: 08/19/2022 Comorbid Cataracts, Chronic Obstructive Pulmonary Disease (COPD), Weeks Of Treatment: 2 History: Arrhythmia, Hypertension, Osteoarthritis, Received Radiation Clustered Wound: No Photos Wound Measurements Length: (cm) 0.8 Width: (cm) 3 Depth: (cm) 0.1 Area: (cm) 1.885 Volume: (cm) 0.188 % Reduction in Area: -1100.6% % Reduction in Volume: -1075% Epithelialization: None Tunneling: No Undermining: No Wound Description Classification: Unstageable/Unclassified Wound Margin: Distinct, outline attached Exudate Amount: Medium Exudate Type: Serosanguineous Exudate Color: red, brown Foul Odor After Cleansing: No Slough/Fibrino No Wound Bed Granulation Amount: None Present (0%) Exposed Structure Necrotic Amount: Large (67-100%) Fascia Exposed: No Necrotic Quality: Eschar Fat Layer (Subcutaneous Tissue) Exposed: Yes Tendon Exposed: No Muscle Exposed: No Joint Exposed: No Bone Exposed: No [address] GRACIELLA, ARMENT [name] ([id number])  [id number] Page 14 of 15 Texture Color No Abnormalities Noted: No No Abnormalities Noted: No Callus: No Atrophie Blanche: No Crepitus: No Cyanosis: No  Excoriation: No Ecchymosis: No Induration: No Erythema: No Rash: No Hemosiderin Staining: No Scarring: No Mottled: No Pallor: No Moisture Rubor: No No Abnormalities Noted: No Dry / Scaly: No Maceration: No Treatment Notes Wound #6 (Foot) Wound Laterality: Right, Lateral Cleanser Peri-Wound Care Topical Betadine Discharge Instruction: Paint Betadine on wounds Primary Dressing Secondary Dressing ABD Pad, 8x10 Discharge Instruction: Apply over primary dressing as directed. Secured With [employer], 4.5x3.1 (in/yd) Discharge Instruction: Secure with Kerlix as directed. 68M Medipore H Soft Cloth Surgical T ape, 4 x 10 (in/yd) Discharge Instruction: Secure with tape as directed. Compression Wrap Compression Stockings Add-Ons Electronic Signature(s) Signed: 09/09/2022 6:19:30 PM By: Karie Schwalbe RN Signed: 09/11/2022 1:10:34 PM By: Thayer Dallas Entered By: Thayer Dallas on 09/09/2022 10:07:35 -------------------------------------------------------------------------------- Vitals Details Patient Name: Date of Service: [name], [name] Burton [name]. 09/09/2022 9:30 A M Medical Record Number: [id number] Patient Account Number: [id number] Date of Birth/Sex: Treating RN: 1923-01-01 (89 y.o. [name] Primary Care Garlan Drewes: Eloisa Northern Other Clinician: Referring Nakoa Ganus: Treating Ladene Allocca/Extender: Gaynelle Arabian in Treatment: 13 Vital Signs Time Taken: 09:55 Temperature (F): 97.6 Pulse (bpm): 64 Respiratory Rate (breaths/min): 16 Blood Pressure (mmHg): 164/78 Reference Range: 80 - 120 mg / dl Electronic Signature(s) Signed: 09/09/2022 6:19:30 PM By: Karie Schwalbe RN Acquanetta Belling, Alsie [name] ([id number]) [id number] Page 15 of 15 Entered By:  Karie Schwalbe on 09/09/2022 09:58:40

## 2022-09-16 DIAGNOSIS — L97311 Non-pressure chronic ulcer of right ankle limited to breakdown of skin: Secondary | ICD-10-CM | POA: Diagnosis not present

## 2022-09-16 DIAGNOSIS — L97511 Non-pressure chronic ulcer of other part of right foot limited to breakdown of skin: Secondary | ICD-10-CM | POA: Diagnosis not present

## 2022-09-16 DIAGNOSIS — L89610 Pressure ulcer of right heel, unstageable: Secondary | ICD-10-CM | POA: Diagnosis not present

## 2022-09-16 DIAGNOSIS — L89896 Pressure-induced deep tissue damage of other site: Secondary | ICD-10-CM | POA: Diagnosis not present

## 2022-09-23 DIAGNOSIS — L89610 Pressure ulcer of right heel, unstageable: Secondary | ICD-10-CM | POA: Diagnosis not present

## 2022-09-23 DIAGNOSIS — L89896 Pressure-induced deep tissue damage of other site: Secondary | ICD-10-CM | POA: Diagnosis not present

## 2022-09-23 DIAGNOSIS — L97311 Non-pressure chronic ulcer of right ankle limited to breakdown of skin: Secondary | ICD-10-CM | POA: Diagnosis not present

## 2022-09-23 DIAGNOSIS — L97511 Non-pressure chronic ulcer of other part of right foot limited to breakdown of skin: Secondary | ICD-10-CM | POA: Diagnosis not present

## 2022-09-30 ENCOUNTER — Encounter (HOSPITAL_BASED_OUTPATIENT_CLINIC_OR_DEPARTMENT_OTHER): Payer: Medicare PPO | Admitting: Physician Assistant

## 2022-09-30 DIAGNOSIS — L97512 Non-pressure chronic ulcer of other part of right foot with fat layer exposed: Secondary | ICD-10-CM | POA: Diagnosis not present

## 2022-09-30 DIAGNOSIS — I480 Paroxysmal atrial fibrillation: Secondary | ICD-10-CM | POA: Diagnosis not present

## 2022-09-30 DIAGNOSIS — I70235 Atherosclerosis of native arteries of right leg with ulceration of other part of foot: Secondary | ICD-10-CM | POA: Diagnosis not present

## 2022-09-30 DIAGNOSIS — L97818 Non-pressure chronic ulcer of other part of right lower leg with other specified severity: Secondary | ICD-10-CM | POA: Diagnosis not present

## 2022-09-30 DIAGNOSIS — M109 Gout, unspecified: Secondary | ICD-10-CM | POA: Diagnosis not present

## 2022-09-30 DIAGNOSIS — L97819 Non-pressure chronic ulcer of other part of right lower leg with unspecified severity: Secondary | ICD-10-CM | POA: Diagnosis not present

## 2022-09-30 DIAGNOSIS — L89890 Pressure ulcer of other site, unstageable: Secondary | ICD-10-CM | POA: Diagnosis not present

## 2022-09-30 DIAGNOSIS — J449 Chronic obstructive pulmonary disease, unspecified: Secondary | ICD-10-CM | POA: Diagnosis not present

## 2022-09-30 DIAGNOSIS — I10 Essential (primary) hypertension: Secondary | ICD-10-CM | POA: Diagnosis not present

## 2022-09-30 DIAGNOSIS — L89610 Pressure ulcer of right heel, unstageable: Secondary | ICD-10-CM | POA: Diagnosis not present

## 2022-09-30 DIAGNOSIS — Z7401 Bed confinement status: Secondary | ICD-10-CM | POA: Diagnosis not present

## 2022-09-30 DIAGNOSIS — M1990 Unspecified osteoarthritis, unspecified site: Secondary | ICD-10-CM | POA: Diagnosis not present

## 2022-09-30 NOTE — Progress Notes (Signed)
[name], [name] ([id number]) [id number] Page 1 of 6 Visit Report for 09/30/2022 Chief Complaint Document Details Patient Name: Date of Service: [name], [name] 09/30/2022 9:30 A M Medical Record Number: [id number] Patient Account Number: [id number] Date of Birth/Sex: Treating RN: 1922/05/31 (89 y.o. F) Primary Care Provider: Eloisa Northern Other Clinician: Referring Provider: Treating Provider/Extender: Gaynelle Arabian in Treatment: 16 Information Obtained from: Patient Chief Complaint Right LE Ulcers Electronic Signature(s) Signed: 09/30/2022 10:02:17 AM By: Allen Derry PA-C Entered By: Allen Derry on 09/30/2022 10:02:17 -------------------------------------------------------------------------------- Debridement Details Patient Name: Date of Service: [name], [name]. 09/30/2022 9:30 A M Medical Record Number: [id number] Patient Account Number: [id number] Date of Birth/Sex: Treating RN: 10/04/1922 (89 y.o. [name] Primary Care Provider: Eloisa Northern Other Clinician: Referring Provider: Treating Provider/Extender: Gaynelle Arabian in Treatment: 16 Debridement Performed for Assessment: Wound #5 Right,Medial Foot Performed By: Physician Lenda Kelp, PA Debridement Type: Debridement Severity of Tissue Pre Debridement: Fat layer exposed Level of Consciousness (Pre-procedure): Awake and Alert Pre-procedure Verification/Time Out Yes - 10:30 Taken: Start Time: 10:30 Pain Control: Lidocaine 4% T opical Solution Percent of Wound Bed Debrided: 100% T Area Debrided (cm): otal 3.3 Tissue and other material debrided: Viable, Skin: Dermis , Skin: Epidermis Level: Skin/Epidermis Debridement Description: Selective/Open Wound Instrument: Forceps, Scissors Bleeding: Minimum Hemostasis Achieved: Pressure End Time: 10:32 Procedural Pain: 0 Post Procedural Pain: 0 Response to Treatment: Procedure was tolerated  well Level of Consciousness (Post- Awake and Alert procedure): Post Debridement Measurements of Total Wound Length: (cm) 2 Width: (cm) 2.1 Depth: (cm) 0.1 Volume: (cm) 0.33 Character of Wound/Ulcer Post Debridement: Improved [name] [name] [phone].pdf Page 2 of 6 Severity of Tissue Post Debridement: Fat layer exposed Post Procedure Diagnosis Same as Pre-procedure Notes Scribed for Allen Derry III PA-C, by [employer]) Unsigned Entered By: Karie Schwalbe on 09/30/2022 10:50:27 -------------------------------------------------------------------------------- Debridement Details Patient Name: Date of Service: [name], [name] 09/30/2022 9:30 A M Medical Record Number: [id number] Patient Account Number: [id number] Date of Birth/Sex: Treating RN: Mar 18, 1922 (89 y.o. [name] Primary Care Provider: Eloisa Northern Other Clinician: Referring Provider: Treating Provider/Extender: Gaynelle Arabian in Treatment: 16 Debridement Performed for Assessment: Wound #3 Right Calcaneus Performed By: Physician Lenda Kelp, PA Debridement Type: Debridement Level of Consciousness (Pre-procedure): Awake and Alert Pre-procedure Verification/Time Out Yes - 10:30 Taken: Start Time: 10:30 Pain Control: Lidocaine 4% Topical Solution Percent of Wound Bed Debrided: 10% T Area Debrided (cm): otal 4.62 Tissue and other material debrided: Non-Viable, Eschar Level: Non-Viable Tissue Debridement Description: Selective/Open Wound Instrument: Forceps, Scissors Bleeding: Minimum Hemostasis Achieved: Pressure End Time: 10:32 Procedural Pain: 0 Post Procedural Pain: 0 Response to Treatment: Procedure was tolerated well Level of Consciousness (Post- Awake and Alert procedure): Post Debridement Measurements of Total Wound Length: (cm) 9.8 Stage: Unstageable/Unclassified Width: (cm) 6 Depth: (cm) 0.1 Volume: (cm)  4.618 Character of Wound/Ulcer Post Debridement: Improved Post Procedure Diagnosis Same as Pre-procedure Notes Scribed for Allen Derry III PA-C, by [employer]) Unsigned Entered By: Karie Schwalbe on 09/30/2022 10:50:48 Signature(s): [name] ([id number]) [id number] Date(s): [id number] Page 3 of 6 -------------------------------------------------------------------------------- Debridement Details Patient Name: Date of Service: [name], [name] 09/30/2022 9:30 A M Medical Record Number: [id number] Patient Account Number: [id number] Date of Birth/Sex: Treating RN: 1922-11-14 (89 y.o. [name] Primary Care Provider: Eloisa Northern Other Clinician: Referring Provider: Treating Provider/Extender: Bess Kinds Weeks in Treatment:  16 Debridement Performed for Assessment: Wound #2 Right,Posterior Lower Leg Performed By: Physician Lenda Kelp, PA Debridement Type: Debridement Level of Consciousness (Pre-procedure): Awake and Alert Pre-procedure Verification/Time Out Yes - 10:30 Taken: Start Time: 10:30 Pain Control: Lidocaine 4% T opical Solution Percent of Wound Bed Debrided: 20% T Area Debrided (cm): otal 4.55 Tissue and other material debrided: Viable, Non-Viable, Eschar, Subcutaneous Level: Skin/Subcutaneous Tissue Debridement Description: Excisional Instrument: Forceps, Scissors Bleeding: Minimum Hemostasis Achieved: Pressure End Time: 10:32 Procedural Pain: 0 Post Procedural Pain: 0 Response to Treatment: Procedure was tolerated well Level of Consciousness (Post- Awake and Alert procedure): Post Debridement Measurements of Total Wound Length: (cm) 5 Stage: Unstageable/Unclassified Width: (cm) 5.8 Depth: (cm) 0.1 Volume: (cm) 2.278 Character of Wound/Ulcer Post Debridement: Improved Post Procedure Diagnosis Same as Pre-procedure Notes Scribed for Allen Derry III PA-C, by [employer]) Unsigned Entered By: Karie Schwalbe on 09/30/2022 10:51:10 -------------------------------------------------------------------------------- Physician Orders Details Patient Name: Date of Service: [name], [name] 09/30/2022 9:30 A M Medical Record Number: [id number] Patient Account Number: [id number] Date of Birth/Sex: Treating RN: 21-Mar-1922 (89 y.o. [name] Primary Care Provider: Eloisa Northern Other Clinician: Referring Provider: Treating Provider/Extender: Gaynelle Arabian in Treatment: [phone] Verbal / Phone Orders: No Diagnosis Coding [name], [name] ([id number]) [id number] Page 4 of 6 ICD-10 Coding Code Description I73.89 Other specified peripheral vascular diseases L97.818 Non-pressure chronic ulcer of other part of right lower leg with other specified severity L89.610 Pressure ulcer of right heel, unstageable I10 Essential (primary) hypertension J44.9 Chronic obstructive pulmonary disease, unspecified M62.81 Muscle weakness (generalized) I48.0 Paroxysmal atrial fibrillation Follow-up Appointments ppointment in 2 weeks. Allen Derry PA Room 9 Wed. August 14th at 9:30am Return A ***[place]*** Do not put right before lunch or at the end of day*** Anesthetic Wound #2 Right,Posterior Lower Leg (In clinic) Topical Lidocaine 4% applied to wound bed Wound #3 Right Calcaneus (In clinic) Topical Lidocaine 4% applied to wound bed Wound #4 Right,Dorsal Foot (In clinic) Topical Lidocaine 4% applied to wound bed Wound #5 Right,Medial Foot (In clinic) Topical Lidocaine 4% applied to wound bed Wound #6 Right,Lateral Foot (In clinic) Topical Lidocaine 4% applied to wound bed Bathing/ Shower/ Hygiene May shower and wash wound with soap and water. Off-Loading Prevalon Boot - DO NOT USE ANY COMPRESSION TO LEGS Prevalon boots on at all times- Please keep Prevalon Boots clean. Wound Treatment Wound #2 - Lower Leg Wound  Laterality: Right, Posterior Topical: Betadine 1 x Per Day/30 Days Discharge Instructions: Paint Betadine on wounds Prim Dressing: Maxorb Extra Ag+ Alginate Dressing, 4x4.75 (in/in) 1 x Per Day/30 Days ary Discharge Instructions: Apply to Non Eschar (black tissue) area to back of the Right leg Secondary Dressing: ABD Pad, 8x10 1 x Per Day/30 Days Discharge Instructions: Apply over primary dressing as directed. Secured With: [employer], 4.5x3.1 (in/yd) 1 x Per Day/30 Days Discharge Instructions: Secure with Kerlix as directed. Secured With: 152M Medipore H Soft Cloth Surgical T ape, 4 x 10 (in/yd) 1 x Per Day/30 Days Discharge Instructions: Secure with tape as directed. Wound #3 - Calcaneus Wound Laterality: Right Topical: Betadine 1 x Per Day/30 Days Discharge Instructions: Paint Betadine on wounds Secondary Dressing: ABD Pad, 8x10 1 x Per Day/30 Days Discharge Instructions: Apply over primary dressing as directed. Secured With: [employer], 4.5x3.1 (in/yd) 1 x Per Day/30 Days Discharge Instructions: Secure with Kerlix as directed. Secured With: 152M Medipore H Soft Cloth Surgical T ape, 4 x 10 (in/yd)  1 x Per Day/30 Days Discharge Instructions: Secure with tape as directed. Wound #4 - Foot Wound Laterality: Dorsal, Right Topical: Betadine 1 x Per Day/30 Days Discharge Instructions: Paint Betadine on wounds Secondary Dressing: ABD Pad, 8x10 1 x Per Day/30 Days Discharge Instructions: Apply over primary dressing as directed. Secured With: [employer], 4.5x3.1 (in/yd) 1 x Per Day/30 Days [name], [name] ([id number]) [id number] Page 5 of 6 Discharge Instructions: Secure with Kerlix as directed. Secured With: 55M Medipore H Soft Cloth Surgical T ape, 4 x 10 (in/yd) 1 x Per Day/30 Days Discharge Instructions: Secure with tape as directed. Wound #5 - Foot Wound Laterality: Right, Medial Topical: Betadine 1 x Per Day/30 Days Discharge  Instructions: Paint Betadine on wounds Secondary Dressing: ABD Pad, 8x10 1 x Per Day/30 Days Discharge Instructions: Apply over primary dressing as directed. Secured With: [employer], 4.5x3.1 (in/yd) 1 x Per Day/30 Days Discharge Instructions: Secure with Kerlix as directed. Secured With: 55M Medipore H Soft Cloth Surgical T ape, 4 x 10 (in/yd) 1 x Per Day/30 Days Discharge Instructions: Secure with tape as directed. Wound #6 - Foot Wound Laterality: Right, Lateral Topical: Betadine 1 x Per Day/30 Days Discharge Instructions: Paint Betadine on wounds Secondary Dressing: ABD Pad, 8x10 1 x Per Day/30 Days Discharge Instructions: Apply over primary dressing as directed. Secured With: [employer], 4.5x3.1 (in/yd) 1 x Per Day/30 Days Discharge Instructions: Secure with Kerlix as directed. Secured With: 55M Medipore H Soft Cloth Surgical T ape, 4 x 10 (in/yd) 1 x Per Day/30 Days Discharge Instructions: Secure with tape as directed. Electronic Signature(s) Unsigned Entered By: Karie Schwalbe on 09/30/2022 10:47:42 -------------------------------------------------------------------------------- Problem List Details Patient Name: Date of Service: [name], [name] 09/30/2022 9:30 A M Medical Record Number: [id number] Patient Account Number: [id number] Date of Birth/Sex: Treating RN: August 28, 1922 (89 y.o. F) Primary Care Provider: Eloisa Northern Other Clinician: Referring Provider: Treating Provider/Extender: Gaynelle Arabian in Treatment: 16 Active Problems ICD-10 Encounter Code Description Active Date MDM Diagnosis I73.89 Other specified peripheral vascular diseases 06/10/2022 No Yes L97.818 Non-pressure chronic ulcer of other part of right lower leg with other specified 06/10/2022 No Yes severity L89.610 Pressure ulcer of right heel, unstageable 06/10/2022 No Yes I10 Essential (primary) hypertension 06/10/2022 No Yes [name], [name]  [phone].pdf Page 6 of 6 J44.9 Chronic obstructive pulmonary disease, unspecified 06/10/2022 No Yes M62.81 Muscle weakness (generalized) 06/10/2022 No Yes I48.0 Paroxysmal atrial fibrillation 06/10/2022 No Yes Inactive Problems Resolved Problems Electronic Signature(s) Signed: 09/30/2022 10:02:02 AM By: Allen Derry PA-C Entered By: Allen Derry on 09/30/2022 10:02:02

## 2022-10-02 NOTE — Progress Notes (Signed)
[name], [name] ([id number]) [id number] Page 1 of 12 Visit Report for 09/30/2022 Arrival Information Details Patient Name: Date of Service: [name], [name] 09/30/2022 9:30 A M Medical Record Number: [id number] Patient Account Number: [id number] Date of Birth/Sex: Treating RN: 02/19/1923 (89 y.o. F) Primary Care : Eloisa Northern Other Clinician: Referring : Treating /Extender: Gaynelle Arabian in Treatment: 16 Visit Information History Since Last Visit Added or deleted any medications: No Patient Arrived: Wheel Chair Any new allergies or adverse reactions: No Arrival Time: 09:36 Had a fall or experienced change in No Accompanied By: niece activities of daily living that may affect Transfer Assistance: None risk of falls: Patient Identification Verified: Yes Signs or symptoms of abuse/neglect since last visito No Secondary Verification Process Completed: Yes Hospitalized since last visit: No Patient Requires Transmission-Based Precautions: No Implantable device outside of the clinic excluding No Patient Has Alerts: Yes cellular tissue based products placed in the center Patient Alerts: Michiel Sites**** since last visit: Has Dressing in Place as Prescribed: Yes Pain Present Now: No Electronic Signature(s) Signed: 10/02/2022 11:53:17 AM By: Thayer Dallas Entered By: Thayer Dallas on 09/30/2022 09:37:26 -------------------------------------------------------------------------------- Encounter Discharge Information Details Patient Name: Date of Service: [name], [name] Burton [name]. 09/30/2022 9:30 A M Medical Record Number: [id number] Patient Account Number: [id number] Date of Birth/Sex: Treating RN: 02/21/1923 (89 y.o. [name] Primary Care : Eloisa Northern Other Clinician: Referring : Treating /Extender: Gaynelle Arabian in Treatment: [phone] Encounter Discharge Information Items  Post Procedure Vitals Discharge Condition: Stable Temperature (F): 97.9 Ambulatory Status: Wheelchair Pulse (bpm): 64 Discharge Destination: Skilled Nursing Facility Respiratory Rate (breaths/min): 18 Telephoned: No Blood Pressure (mmHg): 131/72 Orders Sent: Yes Transportation: Private Auto Accompanied By: daughter Schedule Follow-up Appointment: Yes Clinical Summary of Care: Patient Declined Electronic Signature(s) Signed: 09/30/2022 5:08:26 PM By: Karie Schwalbe RN Entered By: Karie Schwalbe on 09/30/2022 17:00:49 [name] ([id number]) [id number] Page 2 of 12 -------------------------------------------------------------------------------- Lower Extremity Assessment Details Patient Name: Date of Service: [name], [name] 09/30/2022 9:30 A M Medical Record Number: [id number] Patient Account Number: [id number] Date of Birth/Sex: Treating RN: Jan 31, 1923 (89 y.o. F) Primary Care : Eloisa Northern Other Clinician: Referring : Treating /Extender: Bess Kinds Weeks in Treatment: 16 Edema Assessment Assessed: [Left: No] [Right: No] [Left: Edema] [Right: :] Calf Left: Right: Point of Measurement: From Medial Instep 38.7 cm Ankle Left: Right: Point of Measurement: From Medial Instep 23.6 cm Vascular Assessment Extremity colors, hair growth, and conditions: Extremity Color: [Right:Pale] Hair Growth on Extremity: [Right:No] Temperature of Extremity: [Right:Warm] Capillary Refill: [Right:> 3 seconds] Dependent Rubor: [Right:No No] Electronic Signature(s) Signed: 10/02/2022 11:53:17 AM By: Thayer Dallas Entered By: Thayer Dallas on 09/30/2022 09:50:52 -------------------------------------------------------------------------------- Multi-Disciplinary Care Plan Details Patient Name: Date of Service: [name], [name] Burton [name]. 09/30/2022 9:30 A M Medical Record Number: [id number] Patient Account Number:  [id number] Date of Birth/Sex: Treating RN: 12-10-1922 (89 y.o. [name] Primary Care : Eloisa Northern Other Clinician: Referring : Treating /Extender: Gaynelle Arabian in Treatment: 16 Active Inactive Pressure Nursing Diagnoses: Knowledge deficit related to causes and risk factors for pressure ulcer development Knowledge deficit related to management of pressures ulcers Potential for impaired tissue integrity related to pressure, friction, moisture, and shear Goals: Patient will remain free from development of additional pressure ulcers Date Initiated: 06/10/2022 Target Resolution Date: 09/30/2023 [name], [name] ([id number]) [phone].pdf Page 3 of 12 Goal Status: Active Patient will remain free of pressure ulcers Date  Initiated: 06/10/2022 Target Resolution Date: 09/30/2023 Goal Status: Active Patient/caregiver will verbalize risk factors for pressure ulcer development Date Initiated: 06/10/2022 Target Resolution Date: 09/30/2023 Goal Status: Active Patient/caregiver will verbalize understanding of pressure ulcer management Date Initiated: 06/10/2022 Target Resolution Date: 09/30/2023 Goal Status: Active Interventions: Assess: immobility, friction, shearing, incontinence upon admission and as needed Assess offloading mechanisms upon admission and as needed Assess potential for pressure ulcer upon admission and as needed Provide education on pressure ulcers Notes: Tissue Oxygenation Nursing Diagnoses: Actual ineffective tissue perfusion; peripheral (select once diagnosis is confirmed) Goals: Non-invasive arterial studies are completed as ordered Date Initiated: 08/12/2022 Target Resolution Date: 08/12/2023 Goal Status: Active Interventions: Assess patient understanding of disease process and management upon diagnosis and as needed Assess peripheral arterial status upon admission and as needed Provide  education on tissue oxygenation and ischemia Notes: Wound/Skin Impairment Nursing Diagnoses: Impaired tissue integrity Knowledge deficit related to ulceration/compromised skin integrity Goals: Patient/caregiver will verbalize understanding of skin care regimen Date Initiated: 06/10/2022 Target Resolution Date: 09/30/2023 Goal Status: Active Ulcer/skin breakdown will have a volume reduction of 30% by week 4 Date Initiated: 06/10/2022 Target Resolution Date: 09/30/2023 Goal Status: Active Interventions: Assess patient/caregiver ability to obtain necessary supplies Assess patient/caregiver ability to perform ulcer/skin care regimen upon admission and as needed Assess ulceration(s) every visit Provide education on ulcer and skin care Notes: Electronic Signature(s) Signed: 09/30/2022 5:08:26 PM By: Karie Schwalbe RN Entered By: Karie Schwalbe on 09/30/2022 15:06:16 -------------------------------------------------------------------------------- Pain Assessment Details Patient Name: Date of Service: [name] 09/30/2022 9:30 A M Medical Record Number: [id number] Patient Account Number: [id number] Date of Birth/Sex: Treating RN: 1922-06-15 (89 y.o. F) Primary Care : Eloisa Northern Other Clinician: [name] ([id number]) [id number] Page 4 of 12 Referring : Treating /Extender: Gaynelle Arabian in Treatment: 16 Active Problems Location of Pain Severity and Description of Pain Patient Has Paino No Site Locations Pain Management and Medication Current Pain Management: Electronic Signature(s) Signed: 10/02/2022 11:53:17 AM By: Thayer Dallas Entered By: Thayer Dallas on 09/30/2022 09:39:36 -------------------------------------------------------------------------------- Patient/Caregiver Education Details Patient Name: Date of Service: [name] 7/31/2024andnbsp9:30 A M Medical Record Number:  [id number] Patient Account Number: [id number] Date of Birth/Gender: Treating RN: 1922/03/06 (89 y.o. [name] Primary Care Physician: Eloisa Northern Other Clinician: Referring Physician: Treating Physician/Extender: Gaynelle Arabian in Treatment: 16 Education Assessment Education Provided To: Caregiver Education Topics Provided Wound/Skin Impairment: Methods: Explain/Verbal Responses: State content correctly [name]) Signed: 09/30/2022 5:08:26 PM By: Karie Schwalbe RN Entered By: Karie Schwalbe on 09/30/2022 15:07:01 [name] ([id number]) [phone].pdf Page 5 of 12 -------------------------------------------------------------------------------- Wound Assessment Details Patient Name: Date of Service: [name], [name] 09/30/2022 9:30 A M Medical Record Number: [id number] Patient Account Number: [id number] Date of Birth/Sex: Treating RN: 18-Dec-1922 (89 y.o. F) Primary Care : Eloisa Northern Other Clinician: Referring : Treating /Extender: Gaynelle Arabian in Treatment: 16 Wound Status Wound Number: 2 Primary Pressure Ulcer Etiology: Wound Location: Right, Posterior Lower Leg Wound Open Wounding Event: Pressure Injury Status: Date Acquired: 05/27/2022 Comorbid Cataracts, Chronic Obstructive Pulmonary Disease (COPD), Weeks Of Treatment: 16 History: Arrhythmia, Hypertension, Osteoarthritis, Received Radiation Clustered Wound: No Photos Wound Measurements Length: (cm) 5 Width: (cm) 5.8 Depth: (cm) 0.1 Area: (cm) 22.777 Volume: (cm) 2.278 % Reduction in Area: 21% % Reduction in Volume: 21% Epithelialization: None Tunneling: No Undermining: No Wound Description Classification: Unstageable/Unclassified Exudate Amount: Medium Exudate Type: Serosanguineous Exudate Color: red, brown Foul Odor  After Cleansing: No Slough/Fibrino Yes Wound Bed Granulation Amount:  Small (1-33%) Exposed Structure Granulation Quality: Red, Hyper-granulation Fat Layer (Subcutaneous Tissue) Exposed: Yes Necrotic Amount: Large (67-100%) Necrotic Quality: Eschar, Adherent Slough Periwound Skin Texture Texture Color No Abnormalities Noted: No No Abnormalities Noted: Yes Scarring: Yes Temperature / Pain Temperature: No Abnormality Moisture No Abnormalities Noted: Yes Treatment Notes Wound #2 (Lower Leg) Wound Laterality: Right, Posterior Cleanser Peri-Wound Care Topical Betadine Discharge Instruction: Paint Betadine on wounds NAZIYA, HEGWOOD [name] ([id number]) [id number] Page 6 of 12 Primary Dressing Maxorb Extra Ag+ Alginate Dressing, 4x4.75 (in/in) Discharge Instruction: Apply to Non Eschar area to back of the Right leg Secondary Dressing ABD Pad, 8x10 Discharge Instruction: Apply over primary dressing as directed. Secured With [employer], 4.5x3.1 (in/yd) Discharge Instruction: Secure with Kerlix as directed. 18M Medipore H Soft Cloth Surgical T ape, 4 x 10 (in/yd) Discharge Instruction: Secure with tape as directed. Compression Wrap Compression Stockings Add-Ons Electronic Signature(s) Signed: 10/02/2022 11:53:17 AM By: Thayer Dallas Entered By: Thayer Dallas on 09/30/2022 09:58:59 -------------------------------------------------------------------------------- Wound Assessment Details Patient Name: Date of Service: [name] Forth [name]. 09/30/2022 9:30 A M Medical Record Number: [id number] Patient Account Number: [id number] Date of Birth/Sex: Treating RN: Feb 11, 1923 (89 y.o. F) Primary Care : Eloisa Northern Other Clinician: Referring : Treating /Extender: Gaynelle Arabian in Treatment: 16 Wound Status Wound Number: 3 Primary Pressure Ulcer Etiology: Wound Location: Right Calcaneus Wound Open Wounding Event: Pressure Injury Status: Date Acquired: 05/27/2022 Comorbid Cataracts,  Chronic Obstructive Pulmonary Disease (COPD), Weeks Of Treatment: 16 History: Arrhythmia, Hypertension, Osteoarthritis, Received Radiation Clustered Wound: No Photos Wound Measurements Length: (cm) 9.8 Width: (cm) 6 Depth: (cm) 0.1 Area: (cm) 46.181 Volume: (cm) 4.618 % Reduction in Area: 21.6% % Reduction in Volume: 21.6% Epithelialization: None Tunneling: No Undermining: No Wound Description Classification: Unstageable/Unclassified Exudate Amount: Medium Boger, Dereka [name] Exudate Type: Serosanguineous Exudate Color: red, brown Foul Odor After Cleansing: No Slough/Fibrino No [phone].pdf Page 7 of 12 Wound Bed Granulation Amount: Small (1-33%) Exposed Structure Granulation Quality: Red, Pink Fascia Exposed: No Necrotic Amount: Large (67-100%) Fat Layer (Subcutaneous Tissue) Exposed: Yes Necrotic Quality: Eschar Tendon Exposed: No Muscle Exposed: No Joint Exposed: No Bone Exposed: No Periwound Skin Texture Texture Color No Abnormalities Noted: No No Abnormalities Noted: No Callus: No Atrophie Blanche: No Crepitus: No Cyanosis: No Excoriation: No Ecchymosis: No Induration: No Erythema: No Rash: No Hemosiderin Staining: No Scarring: No Mottled: No Pallor: No Moisture Rubor: No No Abnormalities Noted: No Dry / Scaly: No Maceration: No Treatment Notes Wound #3 (Calcaneus) Wound Laterality: Right Cleanser Peri-Wound Care Topical Betadine Discharge Instruction: Paint Betadine on wounds Primary Dressing Maxorb Extra Ag+ Alginate Dressing, 4x4.75 (in/in) Discharge Instruction: Apply to Non Eschar area to back of the Right leg Secondary Dressing ABD Pad, 8x10 Discharge Instruction: Apply over primary dressing as directed. Secured With [employer], 4.5x3.1 (in/yd) Discharge Instruction: Secure with Kerlix as directed. 18M Medipore H Soft Cloth Surgical T ape, 4 x 10 (in/yd) Discharge Instruction: Secure with  tape as directed. Compression Wrap Compression Stockings Add-Ons Electronic Signature(s) Signed: 10/02/2022 11:53:17 AM By: Thayer Dallas Entered By: Thayer Dallas on 09/30/2022 10:01:01 -------------------------------------------------------------------------------- Wound Assessment Details Patient Name: Date of Service: [name] Forth [name]. 09/30/2022 9:30 A M Medical Record Number: [id number] Patient Account Number: [id number] Date of Birth/Sex: Treating RN: 1922/10/29 (89 y.o. Sadaf Przybysz, Camelle [name] ([id number]) [id number] Page 8 of 12 Primary Care : Eloisa Northern Other Clinician: Referring : Treating /Extender: Lenda Kelp  Eloisa Northern Weeks in Treatment: 16 Wound Status Wound Number: 4 Primary Pressure Ulcer Etiology: Wound Location: Right, Dorsal Foot Wound Open Wounding Event: Gradually Appeared Status: Date Acquired: 06/24/2022 Comorbid Cataracts, Chronic Obstructive Pulmonary Disease (COPD), Weeks Of Treatment: 14 History: Arrhythmia, Hypertension, Osteoarthritis, Received Radiation Clustered Wound: Yes Photos Wound Measurements Length: (cm) Width: (cm) Depth: (cm) Clustered Quantity: Area: (cm) Volume: (cm) 6.5 % Reduction in Area: 39.4% 8.2 % Reduction in Volume: 39.4% 0.1 Epithelialization: None 3 Tunneling: No 41.862 Undermining: No 4.186 Wound Description Classification: Unstageable/Unclassified Wound Margin: Distinct, outline attached Exudate Amount: Medium Exudate Type: Serosanguineous Exudate Color: red, brown Foul Odor After Cleansing: No Slough/Fibrino Yes Wound Bed Granulation Amount: None Present (0%) Exposed Structure Necrotic Amount: Large (67-100%) Fascia Exposed: No Necrotic Quality: Eschar Fat Layer (Subcutaneous Tissue) Exposed: Yes Tendon Exposed: No Muscle Exposed: No Joint Exposed: No Bone Exposed: No Periwound Skin Texture Texture Color No Abnormalities Noted: No No  Abnormalities Noted: No Callus: No Atrophie Blanche: No Crepitus: No Cyanosis: No Excoriation: No Ecchymosis: No Induration: No Erythema: No Rash: No Hemosiderin Staining: No Scarring: Yes Mottled: No Pallor: No Moisture Rubor: No No Abnormalities Noted: Yes Temperature / Pain Temperature: No Abnormality Treatment Notes Wound #4 (Foot) Wound Laterality: Dorsal, Right Cleanser Peri-Wound Care Topical Betadine [name], [name] ([id number]) [phone].pdf Page 9 of 12 Discharge Instruction: Paint Betadine on wounds Primary Dressing Secondary Dressing ABD Pad, 8x10 Discharge Instruction: Apply over primary dressing as directed. Secured With [employer], 4.5x3.1 (in/yd) Discharge Instruction: Secure with Kerlix as directed. 47M Medipore H Soft Cloth Surgical T ape, 4 x 10 (in/yd) Discharge Instruction: Secure with tape as directed. Compression Wrap Compression Stockings Add-Ons Electronic Signature(s) Signed: 10/02/2022 11:53:17 AM By: Thayer Dallas Entered By: Thayer Dallas on 09/30/2022 09:55:46 -------------------------------------------------------------------------------- Wound Assessment Details Patient Name: Date of Service: [name] Forth [name]. 09/30/2022 9:30 A M Medical Record Number: [id number] Patient Account Number: [id number] Date of Birth/Sex: Treating RN: [birth year] (89 y.o. F) Primary Care : Eloisa Northern Other Clinician: Referring : Treating /Extender: Gaynelle Arabian in Treatment: 16 Wound Status Wound Number: 5 Primary Arterial Insufficiency Ulcer Etiology: Wound Location: Right, Medial Foot Wound Open Wounding Event: Gradually Appeared Status: Date Acquired: 08/12/2022 Comorbid Cataracts, Chronic Obstructive Pulmonary Disease (COPD), Weeks Of Treatment: 7 History: Arrhythmia, Hypertension, Osteoarthritis, Received Radiation Clustered Wound: No Photos Wound  Measurements Length: (cm) 2 Width: (cm) 2.1 Depth: (cm) 0.1 Area: (cm) 3.299 Volume: (cm) 0.33 % Reduction in Area: -5% % Reduction in Volume: -5.1% Epithelialization: None Tunneling: No Undermining: No Wound Description Classification: Unclassifiable Wound Margin: Distinct, outline attached Exudate Amount: None Present [name], [name] ([id number]) Foul Odor After Cleansing: No Slough/Fibrino No [phone].pdf Page 10 of 12 Wound Bed Granulation Amount: None Present (0%) Exposed Structure Necrotic Amount: Large (67-100%) Fascia Exposed: No Necrotic Quality: Eschar Fat Layer (Subcutaneous Tissue) Exposed: Yes Tendon Exposed: No Muscle Exposed: No Joint Exposed: No Bone Exposed: No Periwound Skin Texture Texture Color No Abnormalities Noted: No No Abnormalities Noted: No Callus: No Atrophie Blanche: No Crepitus: No Cyanosis: No Excoriation: No Ecchymosis: No Induration: No Erythema: No Rash: No Hemosiderin Staining: No Scarring: Yes Mottled: No Pallor: No Moisture Rubor: No No Abnormalities Noted: No Dry / Scaly: No Maceration: No Treatment Notes Wound #5 (Foot) Wound Laterality: Right, Medial Cleanser Peri-Wound Care Topical Betadine Discharge Instruction: Paint Betadine on wounds Primary Dressing Secondary Dressing ABD Pad, 8x10 Discharge Instruction: Apply over primary dressing as directed. Secured With [employer], 4.5x3.1 (in/yd) Discharge  Instruction: Secure with Kerlix as directed. 528M Medipore H Soft Cloth Surgical T ape, 4 x 10 (in/yd) Discharge Instruction: Secure with tape as directed. Compression Wrap Compression Stockings Add-Ons Electronic Signature(s) Signed: 10/02/2022 11:53:17 AM By: Thayer Dallas Entered By: Thayer Dallas on 09/30/2022 09:57:29 -------------------------------------------------------------------------------- Wound Assessment Details Patient Name: Date of Service: [name] Forth [name]. 09/30/2022 9:30 A M Medical Record Number: [id number] Patient Account Number: [id number] Date of Birth/Sex: Treating RN: [birth year] (89 y.o. F) Primary Care : Eloisa Northern Other Clinician: Referring : Treating /Extender: Gaynelle Arabian in Treatment: [address] [place], [place] [name] ([id number]) [id number] Page 11 of 12 Wound Number: 6 Primary Pressure Ulcer Etiology: Wound Location: Right, Lateral Foot Wound Open Wounding Event: Pressure Injury Status: Date Acquired: 08/19/2022 Comorbid Cataracts, Chronic Obstructive Pulmonary Disease (COPD), Weeks Of Treatment: 5 History: Arrhythmia, Hypertension, Osteoarthritis, Received Radiation Clustered Wound: No Photos Wound Measurements Length: (cm) 0.3 Width: (cm) 0.3 Depth: (cm) 0.1 Area: (cm) 0.071 Volume: (cm) 0.007 % Reduction in Area: 54.8% % Reduction in Volume: 56.3% Epithelialization: None Tunneling: No Undermining: No Wound Description Classification: Unstageable/Unclassified Wound Margin: Distinct, outline attached Exudate Amount: Medium Exudate Type: Serosanguineous Exudate Color: red, brown Foul Odor After Cleansing: No Slough/Fibrino No Wound Bed Granulation Amount: None Present (0%) Exposed Structure Necrotic Amount: Large (67-100%) Fascia Exposed: No Necrotic Quality: Eschar Fat Layer (Subcutaneous Tissue) Exposed: Yes Tendon Exposed: No Muscle Exposed: No Joint Exposed: No Bone Exposed: No Periwound Skin Texture Texture Color No Abnormalities Noted: No No Abnormalities Noted: No Callus: No Atrophie Blanche: No Crepitus: No Cyanosis: No Excoriation: No Ecchymosis: No Induration: No Erythema: No Rash: No Hemosiderin Staining: No Scarring: No Mottled: No Pallor: No Moisture Rubor: No No Abnormalities Noted: No Dry / Scaly: No Temperature / Pain Maceration: No Temperature: No Abnormality Treatment Notes Wound #6  (Foot) Wound Laterality: Right, Lateral Cleanser Peri-Wound Care Topical Betadine Discharge Instruction: Paint Betadine on wounds Primary Dressing Secondary Dressing ABD Pad, 8x10 Discharge Instruction: Apply over primary dressing as directed. [name], [name] ([id number]) [id number] Page 12 of 12 Secured With [employer], 4.5x3.1 (in/yd) Discharge Instruction: Secure with Kerlix as directed. 528M Medipore H Soft Cloth Surgical T ape, 4 x 10 (in/yd) Discharge Instruction: Secure with tape as directed. Compression Wrap Compression Stockings Add-Ons Electronic Signature(s) Signed: 10/02/2022 11:53:17 AM By: Thayer Dallas Entered By: Thayer Dallas on 09/30/2022 09:56:23 -------------------------------------------------------------------------------- Vitals Details Patient Name: Date of Service: [name], [name] Burton [name]. 09/30/2022 9:30 A M Medical Record Number: [id number] Patient Account Number: [id number] Date of Birth/Sex: Treating RN: March 07, 1922 (89 y.o. F) Primary Care : Eloisa Northern Other Clinician: Referring : Treating /Extender: Gaynelle Arabian in Treatment: 16 Vital Signs Time Taken: 09:37 Temperature (F): 97.9 Pulse (bpm): 64 Respiratory Rate (breaths/min): 18 Blood Pressure (mmHg): 131/72 Reference Range: 80 - 120 mg / dl Electronic Signature(s) Signed: 10/02/2022 11:53:17 AM By: Thayer Dallas Entered By: Thayer Dallas on 09/30/2022 09:39:30

## 2022-10-07 DIAGNOSIS — L97511 Non-pressure chronic ulcer of other part of right foot limited to breakdown of skin: Secondary | ICD-10-CM | POA: Diagnosis not present

## 2022-10-07 DIAGNOSIS — L97311 Non-pressure chronic ulcer of right ankle limited to breakdown of skin: Secondary | ICD-10-CM | POA: Diagnosis not present

## 2022-10-07 DIAGNOSIS — L98491 Non-pressure chronic ulcer of skin of other sites limited to breakdown of skin: Secondary | ICD-10-CM | POA: Diagnosis not present

## 2022-10-07 DIAGNOSIS — L97811 Non-pressure chronic ulcer of other part of right lower leg limited to breakdown of skin: Secondary | ICD-10-CM | POA: Diagnosis not present

## 2022-10-14 ENCOUNTER — Ambulatory Visit (HOSPITAL_BASED_OUTPATIENT_CLINIC_OR_DEPARTMENT_OTHER): Payer: Medicare PPO | Admitting: Physician Assistant

## 2022-10-15 DIAGNOSIS — L97511 Non-pressure chronic ulcer of other part of right foot limited to breakdown of skin: Secondary | ICD-10-CM | POA: Diagnosis not present

## 2022-10-15 DIAGNOSIS — L98491 Non-pressure chronic ulcer of skin of other sites limited to breakdown of skin: Secondary | ICD-10-CM | POA: Diagnosis not present

## 2022-10-15 DIAGNOSIS — L97311 Non-pressure chronic ulcer of right ankle limited to breakdown of skin: Secondary | ICD-10-CM | POA: Diagnosis not present

## 2022-10-15 DIAGNOSIS — L97811 Non-pressure chronic ulcer of other part of right lower leg limited to breakdown of skin: Secondary | ICD-10-CM | POA: Diagnosis not present

## 2022-10-21 ENCOUNTER — Encounter (HOSPITAL_BASED_OUTPATIENT_CLINIC_OR_DEPARTMENT_OTHER): Payer: Medicare PPO | Admitting: Physician Assistant

## 2022-10-28 ENCOUNTER — Encounter (HOSPITAL_BASED_OUTPATIENT_CLINIC_OR_DEPARTMENT_OTHER): Payer: Medicare PPO | Attending: Physician Assistant | Admitting: Internal Medicine

## 2022-10-28 DIAGNOSIS — L97512 Non-pressure chronic ulcer of other part of right foot with fat layer exposed: Secondary | ICD-10-CM | POA: Diagnosis not present

## 2022-10-28 DIAGNOSIS — I70235 Atherosclerosis of native arteries of right leg with ulceration of other part of foot: Secondary | ICD-10-CM | POA: Diagnosis not present

## 2022-10-28 DIAGNOSIS — L89899 Pressure ulcer of other site, unspecified stage: Secondary | ICD-10-CM | POA: Diagnosis not present

## 2022-10-28 DIAGNOSIS — L89619 Pressure ulcer of right heel, unspecified stage: Secondary | ICD-10-CM | POA: Diagnosis not present

## 2022-11-11 ENCOUNTER — Encounter (HOSPITAL_BASED_OUTPATIENT_CLINIC_OR_DEPARTMENT_OTHER): Payer: Medicare PPO | Attending: Physician Assistant | Admitting: Physician Assistant

## 2022-11-11 DIAGNOSIS — M1990 Unspecified osteoarthritis, unspecified site: Secondary | ICD-10-CM | POA: Insufficient documentation

## 2022-11-11 DIAGNOSIS — L97818 Non-pressure chronic ulcer of other part of right lower leg with other specified severity: Secondary | ICD-10-CM | POA: Insufficient documentation

## 2022-11-11 DIAGNOSIS — I10 Essential (primary) hypertension: Secondary | ICD-10-CM | POA: Diagnosis not present

## 2022-11-11 DIAGNOSIS — Z7401 Bed confinement status: Secondary | ICD-10-CM | POA: Diagnosis not present

## 2022-11-11 DIAGNOSIS — I70235 Atherosclerosis of native arteries of right leg with ulceration of other part of foot: Secondary | ICD-10-CM | POA: Diagnosis not present

## 2022-11-11 DIAGNOSIS — L89893 Pressure ulcer of other site, stage 3: Secondary | ICD-10-CM | POA: Diagnosis not present

## 2022-11-11 DIAGNOSIS — M6281 Muscle weakness (generalized): Secondary | ICD-10-CM | POA: Diagnosis not present

## 2022-11-11 DIAGNOSIS — L97518 Non-pressure chronic ulcer of other part of right foot with other specified severity: Secondary | ICD-10-CM | POA: Insufficient documentation

## 2022-11-11 DIAGNOSIS — L89610 Pressure ulcer of right heel, unstageable: Secondary | ICD-10-CM | POA: Diagnosis not present

## 2022-11-11 DIAGNOSIS — I480 Paroxysmal atrial fibrillation: Secondary | ICD-10-CM | POA: Diagnosis not present

## 2022-11-11 DIAGNOSIS — J449 Chronic obstructive pulmonary disease, unspecified: Secondary | ICD-10-CM | POA: Diagnosis not present

## 2022-11-11 DIAGNOSIS — F0390 Unspecified dementia without behavioral disturbance: Secondary | ICD-10-CM | POA: Diagnosis not present

## 2022-11-11 DIAGNOSIS — L97512 Non-pressure chronic ulcer of other part of right foot with fat layer exposed: Secondary | ICD-10-CM | POA: Diagnosis not present

## 2022-11-11 DIAGNOSIS — L89890 Pressure ulcer of other site, unstageable: Secondary | ICD-10-CM | POA: Diagnosis not present

## 2022-11-11 NOTE — Progress Notes (Signed)
[name] [name] ([id number]) [id number] Page 1 of 8 Visit Report for 11/11/2022 Arrival Information Details Patient Name: Date of Service: [name], [name] 11/11/2022 9:30 A M Medical Record Number: [id number] Patient Account Number: [id number] Date of Birth/Sex: Treating RN: March 22, 1922 (89 y.o. [name] Primary Care [name]: [name] Other Clinician: Referring [name]: Treating [name]/Extender: [name] in [name]: 22 Visit Information History Since Last Visit Added or deleted any medications: No Patient Arrived: Wheel Chair Any new allergies or adverse reactions: No Arrival Time: 09:55 Had a fall or experienced change in No Accompanied By: Daughter activities of daily living that may affect Transfer Assistance: Manual risk of falls: Patient Identification Verified: Yes Signs or symptoms of abuse/neglect since last visito No Patient Requires Transmission-Based Precautions: No Hospitalized since last visit: No Patient Has Alerts: Yes Implantable device outside of the clinic excluding No Patient Alerts: ***Hoyer**** cellular tissue based products placed in the center since last visit: Has Dressing in Place as Prescribed: Yes Pain Present Now: Yes Electronic Signature(s) Signed: 11/11/2022 2:17:11 PM By: [name] Schwalbe RN Entered By: [name] on 11/11/2022 09:55:37 -------------------------------------------------------------------------------- Lower Extremity Assessment Details Patient Name: Date of Service: [name], [name]. 11/11/2022 9:30 A M Medical Record Number: [id number] Patient Account Number: [id number] Date of Birth/Sex: Treating RN: 05-13-1922 (89 y.o. [name] Primary Care [name]: [name] Other Clinician: Referring [name]: Treating [name]/Extender: [name] Weeks in [name]: 22 Edema Assessment Assessed: [Left: No] [Right: No] Edema: [Left:  Ye] [Right: s] Calf Left: Right: Point of Measurement: From Medial Instep 40.4 cm Ankle Left: Right: Point of Measurement: From Medial Instep 23.3 cm Vascular Assessment Pulses: Dorsalis Pedis Palpable: [Right:Yes] Extremity colors, hair growth, and conditions: [name], [name] [Right:[id number] Page 2 of 8] Extremity Color: [Right:Pale] Hair Growth on Extremity: [Right:No] Temperature of Extremity: [Right:Warm] Capillary Refill: [Right:> 3 seconds] Dependent Rubor: [Right:No No] Electronic Signature(s) Signed: 11/11/2022 2:17:11 PM By: [name] Schwalbe RN Entered By: [name] on 11/11/2022 10:13:34 -------------------------------------------------------------------------------- Pain Assessment Details Patient Name: Date of Service: [name], [name] Burton [name]. 11/11/2022 9:30 A M Medical Record Number: [id number] Patient Account Number: [id number] Date of Birth/Sex: Treating RN: 1922/06/17 (89 y.o. [name] Primary Care Leiland Mihelich: [name] Other Clinician: Referring [name]: Treating [name]/Extender: [name] in [name]: 22 Active Problems Location of Pain Severity and Description of Pain Patient Has Paino No Site Locations Pain Management and Medication Current Pain Management: Electronic Signature(s) Signed: 11/11/2022 2:17:11 PM By: [name] Schwalbe RN Entered By: [name] on 11/11/2022 10:00:24 -------------------------------------------------------------------------------- Wound Assessment Details Patient Name: Date of Service: [name], [name] Burton [name]. 11/11/2022 9:30 A M Medical Record Number: [id number] Patient Account Number: [id number] Date of Birth/Sex: Treating RN: [birth year] (89 y.o. [name] Primary Care Kyri Shader: [name] Other Clinician: Referring [name]: Treating [name]/Extender: [name] [name] ([id number])  [id number] Page 3 of 8 Weeks in [name]: 22 Wound Status Wound Number: 2 Primary Pressure Ulcer Etiology: Wound Location: Right, Posterior Lower Leg Wound Open Wounding Event: Pressure Injury Status: Date Acquired: 05/27/2022 Comorbid Cataracts, Chronic Obstructive Pulmonary Disease (COPD), Weeks Of [name]: 22 History: Arrhythmia, Hypertension, Osteoarthritis, Received Radiation Clustered Wound: No Photos Wound Measurements Length: (cm) 4.2 Width: (cm) 5.2 Depth: (cm) 0.1 Area: (cm) 17.153 Volume: (cm) 1.715 % Reduction in Area: 40.5% % Reduction in Volume: 40.5% Epithelialization: Small (1-33%) Tunneling: No Undermining: No Wound Description Classification: Category/Stage III Wound Margin: Distinct, outline attached Exudate  Amount: Medium Exudate Type: Serosanguineous Exudate Color: red, brown Foul Odor After Cleansing: No Slough/Fibrino Yes Wound Bed Granulation Amount: Small (1-33%) Exposed Structure Granulation Quality: Red, Hyper-granulation Fascia Exposed: No Necrotic Amount: Large (67-100%) Fat Layer (Subcutaneous Tissue) Exposed: Yes Necrotic Quality: Eschar, Adherent Slough Tendon Exposed: No Muscle Exposed: No Joint Exposed: No Bone Exposed: No Periwound Skin Texture Texture Color No Abnormalities Noted: No No Abnormalities Noted: Yes Scarring: Yes Temperature / Pain Temperature: No Abnormality Moisture No Abnormalities Noted: Yes Electronic Signature(s) Signed: 11/11/2022 2:17:11 PM By: [name] Schwalbe RN Entered By: [name] on 11/11/2022 10:02:47 -------------------------------------------------------------------------------- Wound Assessment Details Patient Name: Date of Service: [name], [name] Burton [name]. 11/11/2022 9:30 A M Medical Record Number: [id number] Patient Account Number: [id number] Date of Birth/Sex: Treating RN: 04-10-1922 (89 y.o. [name] Primary Care [name]: [name] Other  Clinician: Referring [name]: Treating [name]/Extender: [name] [name] ([id number]) [id number] Page 4 of 8 Weeks in [name]: 22 Wound Status Wound Number: 3 Primary Pressure Ulcer Etiology: Wound Location: Right Calcaneus Wound Open Wounding Event: Pressure Injury Status: Date Acquired: 05/27/2022 Comorbid Cataracts, Chronic Obstructive Pulmonary Disease (COPD), Weeks Of [name]: 22 History: Arrhythmia, Hypertension, Osteoarthritis, Received Radiation Clustered Wound: No Photos Wound Measurements Length: (cm) 10 Width: (cm) 6.5 Depth: (cm) 0.2 Area: (cm) 51.051 Volume: (cm) 10.21 % Reduction in Area: 13.3% % Reduction in Volume: -73.3% Epithelialization: None Tunneling: No Undermining: No Wound Description Classification: Unstageable/Unclassified Wound Margin: Distinct, outline attached Exudate Amount: Medium Exudate Type: Serosanguineous Exudate Color: red, brown Foul Odor After Cleansing: No Slough/Fibrino No Wound Bed Granulation Amount: Small (1-33%) Exposed Structure Granulation Quality: Red, Pink Fascia Exposed: No Necrotic Amount: Large (67-100%) Fat Layer (Subcutaneous Tissue) Exposed: Yes Necrotic Quality: Eschar, Adherent Slough Tendon Exposed: No Muscle Exposed: No Joint Exposed: No Bone Exposed: No Periwound Skin Texture Texture Color No Abnormalities Noted: No No Abnormalities Noted: No Callus: No Atrophie Blanche: No Crepitus: No Cyanosis: No Excoriation: No Ecchymosis: No Induration: No Erythema: No Rash: No Hemosiderin Staining: No Scarring: No Mottled: No Pallor: No Moisture Rubor: No No Abnormalities Noted: No Dry / Scaly: No Maceration: No Electronic Signature(s) Signed: 11/11/2022 2:17:11 PM By: [name] Schwalbe RN Entered By: [name] on 11/11/2022 10:04:27 [name] [id number] Page 5 of  8 -------------------------------------------------------------------------------- Wound Assessment Details Patient Name: Date of Service: [name], [name] 11/11/2022 9:30 A M Medical Record Number: [id number] Patient Account Number: [id number] Date of Birth/Sex: Treating RN: Jan 03, 1923 (89 y.o. [name] Primary Care Alysandra Lobue: [name] Other Clinician: Referring Payton Prinsen: Treating Juline Sanderford/Extender: [name] in [name]: 22 Wound Status Wound Number: 4 Primary Pressure Ulcer Etiology: Wound Location: Right, Dorsal Foot Wound Open Wounding Event: Gradually Appeared Status: Date Acquired: 06/24/2022 Comorbid Cataracts, Chronic Obstructive Pulmonary Disease (COPD), Weeks Of [name]: 20 History: Arrhythmia, Hypertension, Osteoarthritis, Received Radiation Clustered Wound: Yes Photos Wound Measurements Length: (cm) Width: (cm) Depth: (cm) Clustered Quantity: Area: (cm) Volume: (cm) 6.7 % Reduction in Area: 39.1% 8 % Reduction in Volume: -21.8% 0.2 Epithelialization: Small (1-33%) 3 Tunneling: No 42.097 Undermining: No 8.419 Wound Description Classification: Unstageable/Unclassified Wound Margin: Distinct, outline attached Exudate Amount: Medium Exudate Type: Serosanguineous Exudate Color: red, brown Foul Odor After Cleansing: No Slough/Fibrino Yes Wound Bed Granulation Amount: Small (1-33%) Exposed Structure Granulation Quality: Red Fascia Exposed: No Necrotic Amount: Large (67-100%) Fat Layer (Subcutaneous Tissue) Exposed: Yes Necrotic Quality: Eschar, Adherent Slough Tendon Exposed: No Muscle Exposed: No Joint Exposed: No Bone Exposed: No Periwound Skin  Texture Texture Color No Abnormalities Noted: No No Abnormalities Noted: No Callus: No Atrophie Blanche: No Crepitus: No Cyanosis: No Excoriation: No Ecchymosis: No Induration: No Erythema: No Rash: No Hemosiderin Staining: No Scarring: Yes Mottled:  No Pallor: No Moisture Rubor: No No Abnormalities Noted: Yes Temperature / Pain Temperature: No Abnormality Electronic Signature(s) Signed: 11/11/2022 2:17:11 PM By: [name] Schwalbe RN Entered By: [name] on 11/11/2022 10:32:31 [name] ([id number]) [id number] Page 6 of 8 -------------------------------------------------------------------------------- Wound Assessment Details Patient Name: Date of Service: [name], [name] 11/11/2022 9:30 A M Medical Record Number: [id number] Patient Account Number: [id number] Date of Birth/Sex: Treating RN: 09-10-1922 (89 y.o. [name] Primary Care Aubrie Lucien: [name] Other Clinician: Referring Levante Simones: Treating Ariellah Faust/Extender: [name] Weeks in [name]: 22 Wound Status Wound Number: 5 Primary Arterial Insufficiency Ulcer Etiology: Wound Location: Right, Medial Foot Wound Open Wounding Event: Gradually Appeared Status: Date Acquired: 08/12/2022 Comorbid Cataracts, Chronic Obstructive Pulmonary Disease (COPD), Weeks Of [name]: 13 History: Arrhythmia, Hypertension, Osteoarthritis, Received Radiation Clustered Wound: No Photos Wound Measurements Length: (cm) 3. Width: (cm) 3. Depth: (cm) 0. Area: (cm) 9 Volume: (cm) 1 2 % Reduction in Area: -196% 7 % Reduction in Volume: -492.4% 2 Epithelialization: None .299 Tunneling: No .86 Undermining: No Wound Description Classification: Unclassifiable Wound Margin: Distinct, outline attached Exudate Amount: None Present Foul Odor After Cleansing: No Slough/Fibrino No Wound Bed Granulation Amount: None Present (0%) Exposed Structure Necrotic Amount: Large (67-100%) Fascia Exposed: No Necrotic Quality: Eschar Fat Layer (Subcutaneous Tissue) Exposed: Yes Tendon Exposed: No Muscle Exposed: No Joint Exposed: No Bone Exposed: No Periwound Skin Texture Texture Color No Abnormalities Noted: No No  Abnormalities Noted: No Callus: No Atrophie Blanche: No Crepitus: No Cyanosis: No Excoriation: No Ecchymosis: No Induration: No Erythema: No Rash: No Hemosiderin Staining: No Scarring: Yes Mottled: No Pallor: No Moisture Rubor: No No Abnormalities Noted: No Dry / Scaly: No Maceration: No [name], [name] ([id number]) [id number] Page 7 of 8 Electronic Signature(s) Signed: 11/11/2022 2:17:11 PM By: [name] Schwalbe RN Entered By: [name] on 11/11/2022 10:09:44 -------------------------------------------------------------------------------- Wound Assessment Details Patient Name: Date of Service: [name] 11/11/2022 9:30 A M Medical Record Number: [id number] Patient Account Number: [id number] Date of Birth/Sex: Treating RN: 1922-12-10 (89 y.o. [name] Primary Care Elon Eoff: [name] Other Clinician: Referring Hala Narula: Treating Kenzey Birkland/Extender: [name] in [name]: 22 Wound Status Wound Number: 6 Primary Pressure Ulcer Etiology: Wound Location: Right, Lateral Foot Wound Open Wounding Event: Pressure Injury Status: Date Acquired: 08/19/2022 Comorbid Cataracts, Chronic Obstructive Pulmonary Disease (COPD), Weeks Of [name]: 11 History: Arrhythmia, Hypertension, Osteoarthritis, Received Radiation Clustered Wound: No Photos Wound Measurements Length: (cm) 2 Width: (cm) 1 Depth: (cm) 0.1 Area: (cm) 1.571 Volume: (cm) 0.157 % Reduction in Area: -900.6% % Reduction in Volume: -881.2% Epithelialization: None Tunneling: No Undermining: No Wound Description Classification: Unstageable/Unclassified Wound Margin: Distinct, outline attached Exudate Amount: Medium Exudate Type: Serosanguineous Exudate Color: red, brown Foul Odor After Cleansing: No Slough/Fibrino Yes Wound Bed Granulation Amount: None Present (0%) Exposed Structure Necrotic Amount: Large (67-100%) Fascia Exposed:  No Necrotic Quality: Eschar, Adherent Slough Fat Layer (Subcutaneous Tissue) Exposed: Yes Tendon Exposed: No Muscle Exposed: No Joint Exposed: No Bone Exposed: No Periwound Skin Texture Texture Color No Abnormalities Noted: No No Abnormalities Noted: No Callus: No Atrophie Blanche: No Crepitus: No Cyanosis: No Excoriation: No Ecchymosis: No [name], [name] ([id number]) [id number] Page 8 of 8 Induration: No Erythema: No Rash: No Hemosiderin Staining:  No Scarring: No Mottled: No Pallor: No Moisture Rubor: No No Abnormalities Noted: No Dry / Scaly: No Temperature / Pain Maceration: No Temperature: No Abnormality Electronic Signature(s) Signed: 11/11/2022 2:17:11 PM By: [name] Schwalbe RN Entered By: [name] on 11/11/2022 10:10:57 -------------------------------------------------------------------------------- Vitals Details Patient Name: Date of Service: [name], [name] Burton [name]. 11/11/2022 9:30 A M Medical Record Number: [id number] Patient Account Number: [id number] Date of Birth/Sex: Treating RN: Mar 22, 1922 (89 y.o. [name] Primary Care Tico Crotteau: [name] Other Clinician: Referring Genesis Paget: Treating Trentin Knappenberger/Extender: [name] Weeks in [name]: 22 Vital Signs Time Taken: 09:59 Pulse (bpm): 64 Respiratory Rate (breaths/min): 16 Blood Pressure (mmHg): 157/79 Reference Range: 80 - 120 mg / dl Electronic Signature(s) Signed: 11/11/2022 2:17:11 PM By: [name] Schwalbe RN Entered By: [name] on 11/11/2022 10:00:06

## 2022-11-11 NOTE — Progress Notes (Signed)
[name], USUI R ([id number]) [id number] Page 1 of 2 Visit Report for 11/11/2022 Chief Complaint Document Details Patient Name: Date of Service: [name], [name]. 11/11/2022 9:30 A M Medical Record Number: [id number] Patient Account Number: [id number] Date of Birth/Sex: Treating RN: 1922-06-13 (89 y.o. F) Primary Care Provider: Eloisa Northern Other Clinician: Referring Provider: Treating Provider/Extender: Gaynelle Arabian in Treatment: 22 Information Obtained from: Patient Chief Complaint Right LE Ulcers Electronic Signature(s) Signed: 11/11/2022 9:52:54 AM By: Allen Derry PA-C Entered By: Allen Derry on 11/11/2022 09:52:54 -------------------------------------------------------------------------------- Problem List Details Patient Name: Date of Service: [name], [name] 11/11/2022 9:30 A M Medical Record Number: [id number] Patient Account Number: [id number] Date of Birth/Sex: Treating RN: 01/31/1923 (89 y.o. F) Primary Care Provider: Eloisa Northern Other Clinician: Referring Provider: Treating Provider/Extender: Gaynelle Arabian in Treatment: 22 Active Problems ICD-10 Encounter Code Description Active Date MDM Diagnosis I73.89 Other specified peripheral vascular diseases 06/10/2022 No Yes L97.818 Non-pressure chronic ulcer of other part of right lower leg with 06/10/2022 No Yes other specified severity L97.518 Non-pressure chronic ulcer of other part of right foot with other 10/28/2022 No Yes specified severity L89.610 Pressure ulcer of right heel, unstageable 06/10/2022 No Yes I10 Essential (primary) hypertension 06/10/2022 No Yes J44.9 Chronic obstructive pulmonary disease, unspecified 06/10/2022 No Yes OKTOBER, GLUECKERT R ([id number]) [phone].pdf Page 2 of 2 M62.81 Muscle weakness (generalized) 06/10/2022 No Yes I48.0 Paroxysmal atrial fibrillation 06/10/2022 No Yes Inactive Problems Resolved  Problems Electronic Signature(s) Signed: 11/11/2022 9:52:40 AM By: Allen Derry PA-C Entered By: Allen Derry on 11/11/2022 09:52:40

## 2022-12-02 ENCOUNTER — Encounter (HOSPITAL_BASED_OUTPATIENT_CLINIC_OR_DEPARTMENT_OTHER): Payer: Medicare PPO | Attending: Physician Assistant | Admitting: Physician Assistant

## 2022-12-02 DIAGNOSIS — L97818 Non-pressure chronic ulcer of other part of right lower leg with other specified severity: Secondary | ICD-10-CM | POA: Diagnosis not present

## 2022-12-02 DIAGNOSIS — I5032 Chronic diastolic (congestive) heart failure: Secondary | ICD-10-CM | POA: Diagnosis not present

## 2022-12-02 DIAGNOSIS — L89610 Pressure ulcer of right heel, unstageable: Secondary | ICD-10-CM | POA: Diagnosis not present

## 2022-12-02 DIAGNOSIS — L89890 Pressure ulcer of other site, unstageable: Secondary | ICD-10-CM | POA: Diagnosis not present

## 2022-12-02 DIAGNOSIS — I4821 Permanent atrial fibrillation: Secondary | ICD-10-CM | POA: Diagnosis not present

## 2022-12-02 DIAGNOSIS — I10 Essential (primary) hypertension: Secondary | ICD-10-CM | POA: Diagnosis not present

## 2022-12-02 DIAGNOSIS — L97518 Non-pressure chronic ulcer of other part of right foot with other specified severity: Secondary | ICD-10-CM | POA: Insufficient documentation

## 2022-12-02 DIAGNOSIS — R918 Other nonspecific abnormal finding of lung field: Secondary | ICD-10-CM | POA: Diagnosis not present

## 2022-12-02 DIAGNOSIS — J189 Pneumonia, unspecified organism: Secondary | ICD-10-CM | POA: Diagnosis not present

## 2022-12-02 DIAGNOSIS — W19XXXA Unspecified fall, initial encounter: Secondary | ICD-10-CM | POA: Diagnosis not present

## 2022-12-02 DIAGNOSIS — L89893 Pressure ulcer of other site, stage 3: Secondary | ICD-10-CM | POA: Diagnosis not present

## 2022-12-02 DIAGNOSIS — I70235 Atherosclerosis of native arteries of right leg with ulceration of other part of foot: Secondary | ICD-10-CM | POA: Diagnosis not present

## 2022-12-02 DIAGNOSIS — J449 Chronic obstructive pulmonary disease, unspecified: Secondary | ICD-10-CM | POA: Insufficient documentation

## 2022-12-02 DIAGNOSIS — R0902 Hypoxemia: Secondary | ICD-10-CM | POA: Diagnosis not present

## 2022-12-02 DIAGNOSIS — L97512 Non-pressure chronic ulcer of other part of right foot with fat layer exposed: Secondary | ICD-10-CM | POA: Diagnosis not present

## 2022-12-02 DIAGNOSIS — I480 Paroxysmal atrial fibrillation: Secondary | ICD-10-CM | POA: Diagnosis not present

## 2022-12-02 NOTE — Progress Notes (Signed)
[name], [name] 12/02/2022 9:45 A M Medical Record Number: [id number] Patient Account Number: [id number] Date of Birth/Sex: Treating RN: 06-10-1922 (89 y.o. [name] Primary Care [name]: [name] Other Clinician: Referring [name]: Treating [name]/Extender: [name] in Treatment: 25 Active Inactive Pressure Nursing Diagnoses: Knowledge deficit related to causes and risk factors for pressure ulcer development Knowledge deficit related to management of pressures ulcers Potential for impaired tissue integrity related to pressure, friction,  moisture, and shear Goals: Patient will remain free from development of additional pressure ulcers Date Initiated: 06/10/2022 Target Resolution Date: 09/30/2023 Goal Status: Active [name], [name] ([id number]) [id number] Page 5 of 14 Patient will remain free of pressure ulcers Date Initiated: 06/10/2022 Target Resolution Date: 09/30/2023 Goal Status: Active Patient/caregiver will verbalize risk factors for pressure ulcer development Date Initiated: 06/10/2022 Target Resolution Date: 09/30/2023 Goal Status: Active Patient/caregiver will verbalize understanding of pressure ulcer management Date Initiated: 06/10/2022 Target Resolution Date: 09/30/2023 Goal Status: Active Interventions: Assess: immobility, friction, shearing, incontinence upon admission and as needed Assess offloading mechanisms upon admission and as needed Assess potential for pressure ulcer upon admission and as needed Provide education on pressure ulcers Notes: Tissue Oxygenation Nursing Diagnoses: Actual ineffective tissue perfusion; peripheral (select once diagnosis is confirmed) Goals: Non-invasive arterial studies are completed as ordered Date Initiated: 08/12/2022 Target Resolution Date: 08/12/2023 Goal Status: Active Interventions: Assess patient understanding of disease process and management upon diagnosis and as needed Assess peripheral arterial status upon admission and as needed Provide education on tissue oxygenation and ischemia Notes: Wound/Skin Impairment Nursing Diagnoses: Impaired tissue integrity Knowledge deficit related to ulceration/compromised skin integrity Goals: Patient/caregiver will verbalize understanding of skin care regimen Date Initiated: 06/10/2022 Target Resolution Date: 09/30/2023 Goal Status: Active Ulcer/skin breakdown will have a volume reduction of 30% by week 4 Date Initiated: 06/10/2022 Target Resolution Date: 09/30/2023 Goal Status:  Active Interventions: Assess patient/caregiver ability to obtain necessary supplies Assess patient/caregiver ability to perform ulcer/skin care regimen upon admission and as needed Assess ulceration(s) every visit Provide education on ulcer and skin care Notes: Electronic Signature(s) Signed: 12/02/2022 11:17:56 AM By: [name] Schwalbe RN Entered By: [name] on 12/02/2022 08:13:23 -------------------------------------------------------------------------------- Pain Assessment Details Patient Name: Date of Service: [name]. 12/02/2022 9:45 A M Medical Record Number: [id number] Patient Account Number: [id number] Date of Birth/Sex: Treating RN: 1923/02/19 (89 y.o. [name] Primary Care Tierra Th[place]: [name] Other Clinician: Referring Eufelia Veno: Treating Lavella Myren/Extender: [name] [name] ([id number]) [id number] Page 6 of 14 Weeks in Treatment: 25 Active Problems Location of Pain Severity and Description of Pain Patient Has Paino Yes Site Locations Pain Location: Generalized Pain With Dressing Change: No Duration of the Pain. Constant / Intermittento Constant Rate the pain. Current Pain Level: 7 Worst Pain Level: 10 Least Pain Level: 3 Tolerable Pain Level: 5 Character of Pain Describe the Pain: Difficult to Pinpoint Pain Management and Medication Current Pain Management: Medication: Yes Cold Application: No Rest: Yes Massage: No Activity: No T.E.N.S.: No Heat Application: No Leg drop or elevation: No Is the Current Pain Management Adequate: Adequate How does your wound impact your activities of daily livingo Sleep: No Bathing: No Appetite: No Relationship With Others: No Bladder Continence: No Emotions: No Bowel Continence: No Work: No Toileting: No Drive: No Dressing: No Hobbies: No Electronic Signature(s) Signed: 12/02/2022 11:17:56 AM By: [name] Schwalbe RN Entered By:  [name] on 12/02/2022 07:00:09 -------------------------------------------------------------------------------- Patient/Caregiver Education Details Patient Name: Date of Service: [name], [name]  [name] Other Clinician: Referring [name]: Treating [name]/Extender: [name] Weeks in Treatment: 25 Wound Status Wound Number: 5 Primary Arterial Insufficiency Ulcer Etiology: Wound Location: Right, Medial Foot Wound Open Wounding Event: Gradually Appeared Status: Date Acquired: 08/12/2022 Comorbid Cataracts, Chronic Obstructive Pulmonary Disease (COPD), Weeks Of Treatment: 16 History: Arrhythmia, Hypertension, Osteoarthritis, Received Radiation Clustered Wound: No Photos [name], [name] [name] ([id number]) [id number] Page 12 of 14 Wound Measurements Length: (cm) 2.7 Width: (cm) 2.8 Depth: (cm) 0.3 Area: (cm) 5.938 Volume: (cm) 1.781 % Reduction in Area: -89% % Reduction in Volume: -467.2% Epithelialization: None Tunneling: No Undermining: Yes Starting Position (o'clock): 4 Ending Position (o'clock): 6 Maximum Distance: (cm) 0.3 Wound Description Classification: Unclassifiable Wound Margin: Distinct, outline attached Exudate Amount: None Present Foul Odor After Cleansing: No Slough/Fibrino No Wound Bed Granulation Amount: None Present (0%) Exposed Structure Necrotic Amount: Large (67-100%) Fascia Exposed: No Necrotic Quality: Eschar Fat Layer (Subcutaneous Tissue) Exposed: Yes Tendon Exposed: No Muscle Exposed: No Joint Exposed: No Bone Exposed: No Periwound Skin Texture Texture Color No Abnormalities Noted: No No Abnormalities Noted: No Callus: No Atrophie Blanche: No Crepitus: No Cyanosis: No Excoriation: No Ecchymosis: No Induration: No Erythema: No Rash: No Hemosiderin Staining: No Scarring: Yes Mottled: No Pallor: No Moisture Rubor: No No Abnormalities Noted: No Dry / Scaly: No Maceration: No Treatment Notes Wound #5 (Foot) Wound Laterality: Right, Medial Cleanser Peri-Wound Care Topical Primary Dressing Xeroform Occlusive Gauze Dressing, 4x4 in Discharge Instruction: Apply double layer to wound bed as instructed Secondary Dressing ABD Pad, 8x10 Discharge Instruction:  Apply over primary dressing as directed. Secured With [employer], 4.5x3.1 (in/yd) Discharge Instruction: Secure with Kerlix as directed. 50M Medipore H Soft Cloth Surgical T ape, 4 x 10 (in/yd) Discharge Instruction: Secure with tape as directed. [name], [name] [name] ([id number]) [id number] Page 13 of 14 Compression Wrap Compression Stockings Add-Ons Electronic Signature(s) Signed: 12/02/2022 11:17:56 AM By: [name] Schwalbe RN Entered By: [name] on 12/02/2022 07:10:54 -------------------------------------------------------------------------------- Wound Assessment Details Patient Name: Date of Service: [name]. 12/02/2022 9:45 A M Medical Record Number: [id number] Patient Account Number: [id number] Date of Birth/Sex: Treating RN: 03-19-1922 (89 y.o. [name] Primary Care Kensi Karr: [name] Other Clinician: Referring Albertine Lafoy: Treating Chantelle Verdi/Extender: [name] in Treatment: 25 Wound Status Wound Number: 6 Primary Pressure Ulcer Etiology: Wound Location: Right, Lateral Foot Wound Healed - Epithelialized Wounding Event: Pressure Injury Status: Date Acquired: 08/19/2022 Comorbid Cataracts, Chronic Obstructive Pulmonary Disease (COPD), Weeks Of Treatment: 14 History: Arrhythmia, Hypertension, Osteoarthritis, Received Radiation Clustered Wound: No Photos Wound Measurements Length: (cm) Width: (cm) Depth: (cm) Area: (cm) Volume: (cm) 0 % Reduction in Area: 100% 0 % Reduction in Volume: 100% 0 Epithelialization: Large (67-100%) 0 Tunneling: No 0 Undermining: No Wound Description Classification: Unstageable/Unclassified Wound Margin: Distinct, outline attached Exudate Amount: None Present Foul Odor After Cleansing: No Slough/Fibrino No Wound Bed Granulation Amount: None Present (0%) Exposed Structure Necrotic Amount: None Present (0%) Fascia Exposed: No Fat Layer (Subcutaneous  Tissue) Exposed: No Tendon Exposed: No Muscle Exposed: No Joint Exposed: No Bone Exposed: No Periwound Skin Texture Texture Color No Abnormalities Noted: Yes No Abnormalities NotedKELCIE, CURRIE [name] [id number] Page 14 of 14 No Abnormalities Noted: Yes No Abnormalities Noted: Yes Moisture Temperature / Pain No Abnormalities Noted: Yes Temperature: No Abnormality Electronic Signature(s) Signed: 12/02/2022 11:17:56 AM By: [name] Schwalbe RN Entered By: [name] on 12/02/2022 07:31:12 -------------------------------------------------------------------------------- Vitals Details Patient Name: Date of Service: [name], [name].  12/02/2022 9:45 A M Medical Record Number: [id number] Patient Account Number: [id number] Date of Birth/Sex: Treating RN: 03/27/1922 (89 y.o. [name] Primary Care Chaos Carlile: [name] Other Clinician: Referring Laurieanne Galloway: Treating Norbert Malkin/Extender: [name] in Treatment: 25 Vital Signs Time Taken: 09:55 Temperature (F): 97.7 Pulse (bpm): 61 Respiratory Rate (breaths/min): 16 Blood Pressure (mmHg): 171/85 Reference Range: 80 - 120 mg / dl Electronic Signature(s) Signed: 12/02/2022 11:17:56 AM By: [name] Schwalbe RN Entered By: [name] on 12/02/2022 06:59:38  Small (1-33%) Exposed Structure Necrotic Amount: Large (67-100%) Fascia Exposed: No Necrotic Quality: Eschar, Adherent Slough Fat Layer (Subcutaneous Tissue) Exposed: Yes Tendon Exposed: No Muscle Exposed: No Joint Exposed: No Bone Exposed: No Periwound Skin Texture Texture Color No Abnormalities Noted: No No Abnormalities Noted: No Callus: No Atrophie Blanche: No Crepitus: No Cyanosis: No Excoriation: No Ecchymosis: No Induration: No Erythema: No Rash: No Hemosiderin Staining: No Scarring: No Mottled: No Pallor: No Moisture Rubor: No No Abnormalities Noted: No Dry / Scaly: No Maceration: No Treatment Notes Wound #3 (Calcaneus) Wound Laterality: Right Cleanser Peri-Wound Care Topical Primary Dressing Xeroform Occlusive Gauze Dressing, 4x4 in Discharge Instruction: Apply double  layer to wound bed as instructed Secondary Dressing ABD Pad, 8x10 Discharge Instruction: Apply over primary dressing as directed. Secured With [employer], 4.5x3.1 (in/yd) Discharge Instruction: Secure with Kerlix as directed. 61M Medipore H Soft Cloth Surgical T ape, 4 x 10 (in/yd) Discharge Instruction: Secure with tape as directed. [name], [name] ([id number]) [id number] Page 10 of 14 Compression Wrap Compression Stockings Add-Ons Electronic Signature(s) Signed: 12/02/2022 11:17:56 AM By: [name] Schwalbe RN Entered By: [name] on 12/02/2022 07:04:48 -------------------------------------------------------------------------------- Wound Assessment Details Patient Name: Date of Service: [name]. 12/02/2022 9:45 A M Medical Record Number: [id number] Patient Account Number: [id number] Date of Birth/Sex: Treating RN: April 29, 1922 (89 y.o. [name] Primary Care [employer] Student: [name] Other Clinician: Referring Jame Morrell: Treating Dupree Givler/Extender: [name] in Treatment: 25 Wound Status Wound Number: 4 Primary Pressure Ulcer Etiology: Wound Location: Right, Dorsal Foot Wound Open Wounding Event: Gradually Appeared Status: Date Acquired: 06/24/2022 Comorbid Cataracts, Chronic Obstructive Pulmonary Disease (COPD), Weeks Of Treatment: 23 History: Arrhythmia, Hypertension, Osteoarthritis, Received Radiation Clustered Wound: Yes Photos Wound Measurements Length: (cm) Width: (cm) Depth: (cm) Clustered Quantity: Area: (cm) Volume: (cm) 9.3 % Reduction in Area: 30.2% 6.6 % Reduction in Volume: -39.5% 0.2 Epithelialization: Small (1-33%) 3 Tunneling: No 48.208 Undermining: No 9.642 Wound Description Classification: Unstageable/Unclassified Wound Margin: Distinct, outline attached Exudate Amount: Medium Exudate Type: Serosanguineous Exudate Color: red, brown Foul Odor After Cleansing:  No Slough/Fibrino Yes Wound Bed Granulation Amount: None Present (0%) Exposed Structure Necrotic Amount: Large (67-100%) Fascia Exposed: No Necrotic Quality: Eschar Fat Layer (Subcutaneous Tissue) Exposed: Yes Tendon Exposed: No Muscle Exposed: No Joint Exposed: No Bone Exposed: No [address] Jennilyn Esteve, [name] ([id number]) [id number] Page 11 of 14 Texture Color No Abnormalities Noted: No No Abnormalities Noted: No Callus: No Atrophie Blanche: No Crepitus: No Cyanosis: No Excoriation: No Ecchymosis: No Induration: No Erythema: No Rash: No Hemosiderin Staining: No Scarring: Yes Mottled: No Pallor: No Moisture Rubor: No No Abnormalities Noted: Yes Temperature / Pain Temperature: No Abnormality Treatment Notes Wound #4 (Foot) Wound Laterality: Dorsal, Right Cleanser Peri-Wound Care Topical Primary Dressing Xeroform Occlusive Gauze Dressing, 4x4 in Discharge Instruction: Apply double layer to wound bed as instructed Secondary Dressing ABD Pad, 8x10 Discharge Instruction: Apply over primary dressing as directed. Secured With [employer], 4.5x3.1 (in/yd) Discharge Instruction: Secure with Kerlix as directed. 61M Medipore H Soft Cloth Surgical T ape, 4 x 10 (in/yd) Discharge Instruction: Secure with tape as directed. Compression Wrap Compression Stockings Add-Ons Electronic Signature(s) Signed: 12/02/2022 11:17:56 AM By: [name] Schwalbe RN Entered By: [name] on 12/02/2022 07:11:48 -------------------------------------------------------------------------------- Wound Assessment Details Patient Name: Date of Service: [name]. 12/02/2022 9:45 A M Medical Record Number: [id number] Patient Account Number: [id number] Date of Birth/Sex: Treating RN: 1922/07/15 (89 y.o. [name] Primary Care Barnard Sharps: Nelson Chimes,  Small (1-33%) Exposed Structure Necrotic Amount: Large (67-100%) Fascia Exposed: No Necrotic Quality: Eschar, Adherent Slough Fat Layer (Subcutaneous Tissue) Exposed: Yes Tendon Exposed: No Muscle Exposed: No Joint Exposed: No Bone Exposed: No Periwound Skin Texture Texture Color No Abnormalities Noted: No No Abnormalities Noted: No Callus: No Atrophie Blanche: No Crepitus: No Cyanosis: No Excoriation: No Ecchymosis: No Induration: No Erythema: No Rash: No Hemosiderin Staining: No Scarring: No Mottled: No Pallor: No Moisture Rubor: No No Abnormalities Noted: No Dry / Scaly: No Maceration: No Treatment Notes Wound #3 (Calcaneus) Wound Laterality: Right Cleanser Peri-Wound Care Topical Primary Dressing Xeroform Occlusive Gauze Dressing, 4x4 in Discharge Instruction: Apply double  layer to wound bed as instructed Secondary Dressing ABD Pad, 8x10 Discharge Instruction: Apply over primary dressing as directed. Secured With [employer], 4.5x3.1 (in/yd) Discharge Instruction: Secure with Kerlix as directed. 61M Medipore H Soft Cloth Surgical T ape, 4 x 10 (in/yd) Discharge Instruction: Secure with tape as directed. [name], [name] ([id number]) [id number] Page 10 of 14 Compression Wrap Compression Stockings Add-Ons Electronic Signature(s) Signed: 12/02/2022 11:17:56 AM By: [name] Schwalbe RN Entered By: [name] on 12/02/2022 07:04:48 -------------------------------------------------------------------------------- Wound Assessment Details Patient Name: Date of Service: [name]. 12/02/2022 9:45 A M Medical Record Number: [id number] Patient Account Number: [id number] Date of Birth/Sex: Treating RN: April 29, 1922 (89 y.o. [name] Primary Care [employer] Student: [name] Other Clinician: Referring Jame Morrell: Treating Dupree Givler/Extender: [name] in Treatment: 25 Wound Status Wound Number: 4 Primary Pressure Ulcer Etiology: Wound Location: Right, Dorsal Foot Wound Open Wounding Event: Gradually Appeared Status: Date Acquired: 06/24/2022 Comorbid Cataracts, Chronic Obstructive Pulmonary Disease (COPD), Weeks Of Treatment: 23 History: Arrhythmia, Hypertension, Osteoarthritis, Received Radiation Clustered Wound: Yes Photos Wound Measurements Length: (cm) Width: (cm) Depth: (cm) Clustered Quantity: Area: (cm) Volume: (cm) 9.3 % Reduction in Area: 30.2% 6.6 % Reduction in Volume: -39.5% 0.2 Epithelialization: Small (1-33%) 3 Tunneling: No 48.208 Undermining: No 9.642 Wound Description Classification: Unstageable/Unclassified Wound Margin: Distinct, outline attached Exudate Amount: Medium Exudate Type: Serosanguineous Exudate Color: red, brown Foul Odor After Cleansing:  No Slough/Fibrino Yes Wound Bed Granulation Amount: None Present (0%) Exposed Structure Necrotic Amount: Large (67-100%) Fascia Exposed: No Necrotic Quality: Eschar Fat Layer (Subcutaneous Tissue) Exposed: Yes Tendon Exposed: No Muscle Exposed: No Joint Exposed: No Bone Exposed: No [address] Jennilyn Esteve, [name] ([id number]) [id number] Page 11 of 14 Texture Color No Abnormalities Noted: No No Abnormalities Noted: No Callus: No Atrophie Blanche: No Crepitus: No Cyanosis: No Excoriation: No Ecchymosis: No Induration: No Erythema: No Rash: No Hemosiderin Staining: No Scarring: Yes Mottled: No Pallor: No Moisture Rubor: No No Abnormalities Noted: Yes Temperature / Pain Temperature: No Abnormality Treatment Notes Wound #4 (Foot) Wound Laterality: Dorsal, Right Cleanser Peri-Wound Care Topical Primary Dressing Xeroform Occlusive Gauze Dressing, 4x4 in Discharge Instruction: Apply double layer to wound bed as instructed Secondary Dressing ABD Pad, 8x10 Discharge Instruction: Apply over primary dressing as directed. Secured With [employer], 4.5x3.1 (in/yd) Discharge Instruction: Secure with Kerlix as directed. 61M Medipore H Soft Cloth Surgical T ape, 4 x 10 (in/yd) Discharge Instruction: Secure with tape as directed. Compression Wrap Compression Stockings Add-Ons Electronic Signature(s) Signed: 12/02/2022 11:17:56 AM By: [name] Schwalbe RN Entered By: [name] on 12/02/2022 07:11:48 -------------------------------------------------------------------------------- Wound Assessment Details Patient Name: Date of Service: [name]. 12/02/2022 9:45 A M Medical Record Number: [id number] Patient Account Number: [id number] Date of Birth/Sex: Treating RN: 1922/07/15 (89 y.o. [name] Primary Care Barnard Sharps: Nelson Chimes,  12/02/2022 9:45 A M Medical Record Number: [id number] Patient Account Number: [id number] Date of Birth/Sex: Treating RN: 03/27/1922 (89 y.o. [name] Primary Care Chaos Carlile: [name] Other Clinician: Referring Laurieanne Galloway: Treating Norbert Malkin/Extender: [name] in Treatment: 25 Vital Signs Time Taken: 09:55 Temperature (F): 97.7 Pulse (bpm): 61 Respiratory Rate (breaths/min): 16 Blood Pressure (mmHg): 171/85 Reference Range: 80 - 120 mg / dl Electronic Signature(s) Signed: 12/02/2022 11:17:56 AM By: [name] Schwalbe RN Entered By: [name] on 12/02/2022 06:59:38  [name]. 10/2/2024andnbsp9:45 A M Medical Record Number: [id number] Patient Account Number: [id number] Date of Birth/Gender: Treating RN: 1923-01-01 (89 y.o. [name] Primary Care Physician: [name] Other Clinician: Referring Physician: Treating Physician/Extender: [name] in Treatment: 25 Education Assessment Education Provided To: Patient Education Topics Provided Wound/Skin ImpairmentTESS, [name] ([id number]) [id number] Page 7 of 14 Methods: Explain/Verbal Responses: State content correctly Electronic Signature(s) Signed: 12/02/2022 11:17:56 AM By: [name] Schwalbe RN Entered By: [name] on 12/02/2022 08:13:56 -------------------------------------------------------------------------------- Wound Assessment Details Patient Name: Date of Service: [name]. 12/02/2022 9:45 A M Medical Record Number: [id number] Patient Account Number: [id number] Date of Birth/Sex: Treating RN: Jul 20, 1922 (89 y.o. [name] Primary Care Lexani Corona: [name] Other Clinician: Referring Rosabella Edgin: Treating Tacarra Justo/Extender: [name] in Treatment: 25 Wound Status Wound Number: 2 Primary Pressure Ulcer Etiology: Wound Location: Right, Posterior Lower Leg Wound Open Wounding Event: Pressure Injury Status: Date Acquired: 05/27/2022 Comorbid Cataracts, Chronic Obstructive Pulmonary Disease (COPD), Weeks Of Treatment: 25 History: Arrhythmia, Hypertension, Osteoarthritis, Received Radiation Clustered Wound: No Photos Wound Measurements Length: (cm) 3.1 Width: (cm) 4.5 Depth: (cm) 0.1 Area: (cm) 10.956 Volume: (cm) 1.096 % Reduction in Area: 62% % Reduction in Volume:  62% Epithelialization: Small (1-33%) Tunneling: No Undermining: No Wound Description Classification: Category/Stage III Wound Margin: Distinct, outline attached Exudate Amount: Medium Exudate Type: Serosanguineous Exudate Color: red, brown Foul Odor After Cleansing: No Slough/Fibrino Yes Wound Bed Granulation Amount: Small (1-33%) Exposed Structure Granulation Quality: Red, Hyper-granulation Fascia Exposed: No Necrotic Amount: Large (67-100%) Fat Layer (Subcutaneous Tissue) Exposed: Yes Necrotic Quality: Eschar, Adherent Slough Tendon Exposed: No Muscle Exposed: No Joint Exposed: No Bone Exposed: No Periwound Skin Texture Texture Color No Abnormalities Noted: No No Abnormalities NotedTAHESHA, [name] ([id number]) [id number] Page 8 of 14 Scarring: Yes Temperature / Pain Temperature: No Abnormality Moisture No Abnormalities Noted: Yes Treatment Notes Wound #2 (Lower Leg) Wound Laterality: Right, Posterior Cleanser Peri-Wound Care Topical Primary Dressing Xeroform Occlusive Gauze Dressing, 4x4 in Discharge Instruction: Apply double layer to wound bed as instructed Secondary Dressing ABD Pad, 8x10 Discharge Instruction: Apply over primary dressing as directed. Secured With [employer], 4.5x3.1 (in/yd) Discharge Instruction: Secure with Kerlix as directed. 9M Medipore H Soft Cloth Surgical T ape, 4 x 10 (in/yd) Discharge Instruction: Secure with tape as directed. Compression Wrap Compression Stockings Add-Ons Electronic Signature(s) Signed: 12/02/2022 11:17:56 AM By: [name] Schwalbe RN Entered By: [name] on 12/02/2022 07:03:33 -------------------------------------------------------------------------------- Wound Assessment Details Patient Name: Date of Service: [name]. 12/02/2022 9:45 A M Medical Record Number: [id number] Patient Account Number: [id number] Date of Birth/Sex: Treating RN: 03/18/1922 (89  y.o. [name] Primary Care Govind Furey: [name] Other Clinician: Referring Deliana Avalos: Treating Angline Schweigert/Extender: [name] Weeks in Treatment: 25 Wound Status Wound Number: 3 Primary Pressure Ulcer Etiology: Wound Location: Right Calcaneus Wound Open Wounding Event: Pressure Injury Status: Date Acquired: 05/27/2022 Comorbid Cataracts, Chronic Obstructive Pulmonary Disease (COPD), Weeks Of Treatment: 25 History: Arrhythmia, Hypertension, Osteoarthritis, Received Radiation Clustered Wound: No Photos RUTHA, MELGOZA [name] ([id number]) [id number] Page 9 of 14 Wound Measurements Length: (cm) 7.1 Width: (cm) 7.6 Depth: (cm) 0.1 Area: (cm) 42.38 Volume: (cm) 4.238 % Reduction in Area: 28.1% % Reduction in Volume: 28% Epithelialization: None Tunneling: No Undermining: No Wound Description Classification: Unstageable/Unclassified Wound Margin: Distinct, outline attached Exudate Amount: Medium Exudate Type: Serosanguineous Exudate Color: red, brown Foul Odor After Cleansing: No Slough/Fibrino No Wound Bed Granulation Amount:

## 2022-12-02 NOTE — Progress Notes (Signed)
MARAGRET, VANACKER [name] ([id number]) [id number] Page 1 of 2 Visit Report for 12/02/2022 Chief Complaint Document Details Patient Name: Date of Service: [name], [name] 12/02/2022 9:45 A M Medical Record Number: [id number] Patient Account Number: [id number] Date of Birth/Sex: Treating RN: 1922-05-07 (89 y.o. F) Primary Care Provider: Eloisa Northern Other Clinician: Referring Provider: Treating Provider/Extender: Gaynelle Arabian in Treatment: 25 Information Obtained from: Patient Chief Complaint Right LE Ulcers Electronic Signature(s) Signed: 12/02/2022 10:11:44 AM By: Allen Derry PA-C Entered By: Allen Derry on 12/02/2022 10:11:44 -------------------------------------------------------------------------------- Problem List Details Patient Name: Date of Service: [name] Forth [name]. 12/02/2022 9:45 A M Medical Record Number: [id number] Patient Account Number: [id number] Date of Birth/Sex: Treating RN: 08-22-1922 (89 y.o. F) Primary Care Provider: Eloisa Northern Other Clinician: Referring Provider: Treating Provider/Extender: Gaynelle Arabian in Treatment: 25 Active Problems ICD-10 Encounter Code Description Active Date MDM Diagnosis I73.89 Other specified peripheral vascular diseases 06/10/2022 No Yes L97.818 Non-pressure chronic ulcer of other part of right lower leg with 06/10/2022 No Yes other specified severity L97.518 Non-pressure chronic ulcer of other part of right foot with other 10/28/2022 No Yes specified severity L89.610 Pressure ulcer of right heel, unstageable 06/10/2022 No Yes I10 Essential (primary) hypertension 06/10/2022 No Yes J44.9 Chronic obstructive pulmonary disease, unspecified 06/10/2022 No Yes [name], [name] [id number] Page 2 of 2 M62.81 Muscle weakness (generalized) 06/10/2022 No Yes I48.0 Paroxysmal atrial fibrillation 06/10/2022 No Yes Inactive Problems Resolved  Problems Electronic Signature(s) Signed: 12/02/2022 10:11:28 AM By: Allen Derry PA-C Entered By: Allen Derry on 12/02/2022 10:11:27

## 2022-12-03 DIAGNOSIS — J449 Chronic obstructive pulmonary disease, unspecified: Secondary | ICD-10-CM | POA: Diagnosis not present

## 2022-12-03 DIAGNOSIS — R0902 Hypoxemia: Secondary | ICD-10-CM | POA: Diagnosis not present

## 2022-12-03 DIAGNOSIS — I5032 Chronic diastolic (congestive) heart failure: Secondary | ICD-10-CM | POA: Diagnosis not present

## 2022-12-03 DIAGNOSIS — I10 Essential (primary) hypertension: Secondary | ICD-10-CM | POA: Diagnosis not present

## 2022-12-03 DIAGNOSIS — I4821 Permanent atrial fibrillation: Secondary | ICD-10-CM | POA: Diagnosis not present

## 2022-12-04 DIAGNOSIS — R0902 Hypoxemia: Secondary | ICD-10-CM | POA: Diagnosis not present

## 2022-12-04 DIAGNOSIS — J449 Chronic obstructive pulmonary disease, unspecified: Secondary | ICD-10-CM | POA: Diagnosis not present

## 2022-12-04 DIAGNOSIS — I10 Essential (primary) hypertension: Secondary | ICD-10-CM | POA: Diagnosis not present

## 2022-12-04 DIAGNOSIS — J189 Pneumonia, unspecified organism: Secondary | ICD-10-CM | POA: Diagnosis not present

## 2022-12-04 DIAGNOSIS — I5032 Chronic diastolic (congestive) heart failure: Secondary | ICD-10-CM | POA: Diagnosis not present

## 2022-12-04 DIAGNOSIS — I4821 Permanent atrial fibrillation: Secondary | ICD-10-CM | POA: Diagnosis not present

## 2022-12-07 DIAGNOSIS — N182 Chronic kidney disease, stage 2 (mild): Secondary | ICD-10-CM | POA: Diagnosis not present

## 2022-12-07 DIAGNOSIS — D649 Anemia, unspecified: Secondary | ICD-10-CM | POA: Diagnosis not present

## 2022-12-07 DIAGNOSIS — I10 Essential (primary) hypertension: Secondary | ICD-10-CM | POA: Diagnosis not present

## 2022-12-07 DIAGNOSIS — I5089 Other heart failure: Secondary | ICD-10-CM | POA: Diagnosis not present

## 2022-12-07 DIAGNOSIS — J449 Chronic obstructive pulmonary disease, unspecified: Secondary | ICD-10-CM | POA: Diagnosis not present

## 2022-12-07 DIAGNOSIS — J180 Bronchopneumonia, unspecified organism: Secondary | ICD-10-CM | POA: Diagnosis not present

## 2022-12-10 DIAGNOSIS — R0902 Hypoxemia: Secondary | ICD-10-CM | POA: Diagnosis not present

## 2022-12-10 DIAGNOSIS — I5032 Chronic diastolic (congestive) heart failure: Secondary | ICD-10-CM | POA: Diagnosis not present

## 2022-12-10 DIAGNOSIS — I10 Essential (primary) hypertension: Secondary | ICD-10-CM | POA: Diagnosis not present

## 2022-12-10 DIAGNOSIS — I4821 Permanent atrial fibrillation: Secondary | ICD-10-CM | POA: Diagnosis not present

## 2022-12-10 DIAGNOSIS — D649 Anemia, unspecified: Secondary | ICD-10-CM | POA: Diagnosis not present

## 2022-12-10 DIAGNOSIS — J189 Pneumonia, unspecified organism: Secondary | ICD-10-CM | POA: Diagnosis not present

## 2022-12-10 DIAGNOSIS — S42301D Unspecified fracture of shaft of humerus, right arm, subsequent encounter for fracture with routine healing: Secondary | ICD-10-CM | POA: Diagnosis not present

## 2022-12-11 ENCOUNTER — Inpatient Hospital Stay (HOSPITAL_COMMUNITY)
Admission: EM | Admit: 2022-12-11 | Discharge: 2023-01-01 | DRG: 177 | Disposition: E | Payer: Medicare PPO | Attending: Internal Medicine | Admitting: Internal Medicine

## 2022-12-11 ENCOUNTER — Emergency Department (HOSPITAL_COMMUNITY): Payer: Medicare PPO

## 2022-12-11 DIAGNOSIS — Z1152 Encounter for screening for COVID-19: Secondary | ICD-10-CM | POA: Diagnosis not present

## 2022-12-11 DIAGNOSIS — I4891 Unspecified atrial fibrillation: Secondary | ICD-10-CM | POA: Diagnosis present

## 2022-12-11 DIAGNOSIS — J90 Pleural effusion, not elsewhere classified: Secondary | ICD-10-CM | POA: Diagnosis not present

## 2022-12-11 DIAGNOSIS — Z823 Family history of stroke: Secondary | ICD-10-CM

## 2022-12-11 DIAGNOSIS — J9601 Acute respiratory failure with hypoxia: Principal | ICD-10-CM

## 2022-12-11 DIAGNOSIS — Z885 Allergy status to narcotic agent status: Secondary | ICD-10-CM

## 2022-12-11 DIAGNOSIS — I5A Non-ischemic myocardial injury (non-traumatic): Secondary | ICD-10-CM | POA: Diagnosis present

## 2022-12-11 DIAGNOSIS — M86671 Other chronic osteomyelitis, right ankle and foot: Secondary | ICD-10-CM | POA: Diagnosis present

## 2022-12-11 DIAGNOSIS — L97518 Non-pressure chronic ulcer of other part of right foot with other specified severity: Secondary | ICD-10-CM | POA: Diagnosis present

## 2022-12-11 DIAGNOSIS — R54 Age-related physical debility: Secondary | ICD-10-CM | POA: Diagnosis present

## 2022-12-11 DIAGNOSIS — J9622 Acute and chronic respiratory failure with hypercapnia: Secondary | ICD-10-CM | POA: Diagnosis present

## 2022-12-11 DIAGNOSIS — J9811 Atelectasis: Secondary | ICD-10-CM | POA: Diagnosis not present

## 2022-12-11 DIAGNOSIS — I3139 Other pericardial effusion (noninflammatory): Secondary | ICD-10-CM | POA: Diagnosis not present

## 2022-12-11 DIAGNOSIS — Z853 Personal history of malignant neoplasm of breast: Secondary | ICD-10-CM

## 2022-12-11 DIAGNOSIS — M84474A Pathological fracture, right foot, initial encounter for fracture: Secondary | ICD-10-CM | POA: Diagnosis not present

## 2022-12-11 DIAGNOSIS — M19071 Primary osteoarthritis, right ankle and foot: Secondary | ICD-10-CM | POA: Diagnosis not present

## 2022-12-11 DIAGNOSIS — R531 Weakness: Secondary | ICD-10-CM | POA: Diagnosis not present

## 2022-12-11 DIAGNOSIS — J9621 Acute and chronic respiratory failure with hypoxia: Secondary | ICD-10-CM | POA: Diagnosis present

## 2022-12-11 DIAGNOSIS — Z7901 Long term (current) use of anticoagulants: Secondary | ICD-10-CM

## 2022-12-11 DIAGNOSIS — M1990 Unspecified osteoarthritis, unspecified site: Secondary | ICD-10-CM | POA: Diagnosis present

## 2022-12-11 DIAGNOSIS — I4892 Unspecified atrial flutter: Secondary | ICD-10-CM | POA: Diagnosis present

## 2022-12-11 DIAGNOSIS — R1310 Dysphagia, unspecified: Secondary | ICD-10-CM | POA: Diagnosis not present

## 2022-12-11 DIAGNOSIS — Z79890 Hormone replacement therapy (postmenopausal): Secondary | ICD-10-CM

## 2022-12-11 DIAGNOSIS — Z9981 Dependence on supplemental oxygen: Secondary | ICD-10-CM | POA: Diagnosis not present

## 2022-12-11 DIAGNOSIS — Z888 Allergy status to other drugs, medicaments and biological substances status: Secondary | ICD-10-CM

## 2022-12-11 DIAGNOSIS — I5033 Acute on chronic diastolic (congestive) heart failure: Secondary | ICD-10-CM | POA: Diagnosis present

## 2022-12-11 DIAGNOSIS — J690 Pneumonitis due to inhalation of food and vomit: Principal | ICD-10-CM | POA: Diagnosis present

## 2022-12-11 DIAGNOSIS — R0602 Shortness of breath: Secondary | ICD-10-CM | POA: Diagnosis present

## 2022-12-11 DIAGNOSIS — Z515 Encounter for palliative care: Secondary | ICD-10-CM | POA: Diagnosis not present

## 2022-12-11 DIAGNOSIS — G9389 Other specified disorders of brain: Secondary | ICD-10-CM | POA: Diagnosis not present

## 2022-12-11 DIAGNOSIS — I70261 Atherosclerosis of native arteries of extremities with gangrene, right leg: Secondary | ICD-10-CM | POA: Diagnosis present

## 2022-12-11 DIAGNOSIS — L89610 Pressure ulcer of right heel, unstageable: Secondary | ICD-10-CM | POA: Diagnosis present

## 2022-12-11 DIAGNOSIS — I10 Essential (primary) hypertension: Secondary | ICD-10-CM | POA: Diagnosis not present

## 2022-12-11 DIAGNOSIS — I2489 Other forms of acute ischemic heart disease: Secondary | ICD-10-CM | POA: Diagnosis present

## 2022-12-11 DIAGNOSIS — I6782 Cerebral ischemia: Secondary | ICD-10-CM | POA: Diagnosis not present

## 2022-12-11 DIAGNOSIS — Z66 Do not resuscitate: Secondary | ICD-10-CM | POA: Diagnosis not present

## 2022-12-11 DIAGNOSIS — J811 Chronic pulmonary edema: Secondary | ICD-10-CM | POA: Diagnosis not present

## 2022-12-11 DIAGNOSIS — Z7401 Bed confinement status: Secondary | ICD-10-CM

## 2022-12-11 DIAGNOSIS — Z7189 Other specified counseling: Secondary | ICD-10-CM | POA: Diagnosis not present

## 2022-12-11 DIAGNOSIS — I110 Hypertensive heart disease with heart failure: Secondary | ICD-10-CM | POA: Diagnosis present

## 2022-12-11 DIAGNOSIS — E8729 Other acidosis: Secondary | ICD-10-CM | POA: Diagnosis present

## 2022-12-11 DIAGNOSIS — Z7951 Long term (current) use of inhaled steroids: Secondary | ICD-10-CM

## 2022-12-11 DIAGNOSIS — J449 Chronic obstructive pulmonary disease, unspecified: Secondary | ICD-10-CM | POA: Diagnosis present

## 2022-12-11 DIAGNOSIS — Z8249 Family history of ischemic heart disease and other diseases of the circulatory system: Secondary | ICD-10-CM

## 2022-12-11 DIAGNOSIS — Z923 Personal history of irradiation: Secondary | ICD-10-CM

## 2022-12-11 DIAGNOSIS — E039 Hypothyroidism, unspecified: Secondary | ICD-10-CM | POA: Diagnosis present

## 2022-12-11 DIAGNOSIS — Z886 Allergy status to analgesic agent status: Secondary | ICD-10-CM

## 2022-12-11 DIAGNOSIS — R918 Other nonspecific abnormal finding of lung field: Secondary | ICD-10-CM | POA: Diagnosis not present

## 2022-12-11 DIAGNOSIS — R0902 Hypoxemia: Secondary | ICD-10-CM | POA: Diagnosis not present

## 2022-12-11 DIAGNOSIS — G9340 Encephalopathy, unspecified: Secondary | ICD-10-CM | POA: Diagnosis not present

## 2022-12-11 DIAGNOSIS — Z79899 Other long term (current) drug therapy: Secondary | ICD-10-CM

## 2022-12-11 DIAGNOSIS — M869 Osteomyelitis, unspecified: Secondary | ICD-10-CM | POA: Diagnosis not present

## 2022-12-11 DIAGNOSIS — Z882 Allergy status to sulfonamides status: Secondary | ICD-10-CM

## 2022-12-11 MED ORDER — IPRATROPIUM-ALBUTEROL 0.5-2.5 (3) MG/3ML IN SOLN
3.0000 mL | Freq: Once | RESPIRATORY_TRACT | Status: AC
  Administered 2022-12-11: 3 mL via RESPIRATORY_TRACT
  Filled 2022-12-11: qty 3

## 2022-12-11 MED ORDER — METHYLPREDNISOLONE SODIUM SUCC 125 MG IJ SOLR
125.0000 mg | Freq: Once | INTRAMUSCULAR | Status: AC
  Administered 2022-12-11: 125 mg via INTRAVENOUS
  Filled 2022-12-11: qty 2

## 2022-12-11 MED ORDER — FUROSEMIDE 10 MG/ML IJ SOLN
40.0000 mg | Freq: Once | INTRAMUSCULAR | Status: AC
  Administered 2022-12-11: 40 mg via INTRAVENOUS
  Filled 2022-12-11: qty 4

## 2022-12-11 NOTE — ED Triage Notes (Signed)
Pt BIB EMS from [facility]. Facility called ems with concerns of pneumonia, SHOB, crackles in lungs, difficulty speaking r/t hoarse voice. 83-89% on RA.   A&Ox4,RA restriction Hx of copd, chf, pneumonia   EMS VS: 142/88, cbg 134, HR 80's hx afib, Temp 98.3, 96% on 4L [place]

## 2022-12-11 NOTE — ED Provider Notes (Signed)
[place] EMERGENCY DEPARTMENT AT [facility] Provider Note   CSN: [id number] Arrival date & time: 12/11/22  2254     History  Chief Complaint  Patient presents with   Shortness of Breath    [name] is a 89 y.o. female.  Patient with a history of hypertension, COPD, atrial fibrillation, diastolic heart failure presenting with difficulty breathing and hypoxia.  Brought in by EMS with increased work of breathing with desaturations to the mid 80s at her facility.  No medications given.  Does have a hoarse voice.  Denies chest pain.  Denies cough or fever.  Denies abdominal pain, nausea or vomiting.  Denies significant leg swelling.  Does not appear that she is on any blood thinners.  Does not wear oxygen at home but was placed on 3 L by EMS.  The history is provided by the patient and the EMS personnel.  Shortness of Breath Associated symptoms: no abdominal pain, no chest pain, no cough, no fever, no headaches, no rash and no vomiting        Home Medications Prior to Admission medications   Medication Sig Start Date End Date Taking? Authorizing Provider  acetaminophen (TYLENOL) 325 MG tablet Take 2 tablets (650 mg total) by mouth every 6 (six) hours as needed for moderate pain. Patient taking differently: Take 650 mg by mouth in the morning, at noon, and at bedtime. 09/19/20   Arrien, York Ram, MD  albuterol (VENTOLIN HFA) 108 (90 Base) MCG/ACT inhaler Inhale 2 puffs into the lungs every 4 (four) hours as needed for wheezing or shortness of breath.    [provider]  Amino Acids-Protein Hydrolys (FEEDING SUPPLEMENT, PRO-STAT SUGAR FREE 64,) LIQD Take 30 mLs by mouth daily.    [provider]  calcium-vitamin D (OSCAL WITH D) 500-200 MG-UNIT per tablet Take 1 tablet by mouth daily.    [provider]  cyclobenzaprine (FLEXERIL) 5 MG tablet Take 5 mg by mouth at bedtime. 06/23/21   [provider]  Ensure (ENSURE) Take 237  mLs by mouth 2 (two) times daily.    [provider]  furosemide (LASIX) 40 MG tablet Take 40 mg by mouth daily. 06/23/21   [provider]  Guaifenesin 200 MG/5ML LIQD Take 10 mLs by mouth every 4 (four) hours as needed (Cough).    [provider]  ipratropium-albuterol (DUONEB) 0.5-2.5 (3) MG/3ML SOLN Take 3 mLs by nebulization every 6 (six) hours as needed (SHOB/Wheezing).    [provider]  levothyroxine (SYNTHROID) 50 MCG tablet Take 50 mcg by mouth at bedtime. 06/27/21   [provider]  Menthol, Topical Analgesic, (BIOFREEZE) 4 % GEL Apply 1 Application topically at bedtime. To BLE    [provider]  Misc. Devices (POSTURE SEAT) MISC Lift chair 01/19/20   Monica Becton, MD  Multiple Vitamins-Minerals (CENTRUM PO) Take 1 tablet by mouth daily.    [provider]  Omega-3 Fatty Acids (FISH OIL PO) Take 1 tablet by mouth daily.    [provider]  omeprazole (PRILOSEC) 20 MG capsule Take 1 capsule (20 mg total) by mouth every morning. 06/05/22   Zigmund Daniel., MD  oxyCODONE (OXY IR/ROXICODONE) 5 MG immediate release tablet Take 1 tablet (5 mg total) by mouth every 4 (four) hours as needed for severe pain or moderate pain. Patient not taking: Reported on 05/29/2022 08/10/21   [place] Bali, PA-C  polyethylene glycol (MIRALAX) 17 g packet Take 17 g by  mouth daily.    [provider]  potassium chloride (KLOR-CON M) 10 MEQ tablet Take 10 mEq by mouth daily. 06/23/21   [provider]  senna (SENOKOT) 8.6 MG TABS tablet Take 2 tablets by mouth at bedtime.    [provider]  SYMBICORT 160-4.5 MCG/ACT inhaler Inhale 2 puffs into the lungs 2 (two) times daily. 07/10/21   [provider]  zinc oxide 20 % ointment Apply 1 Application topically daily. Left upper posterior thigh    [provider]      Allergies    Morphine and codeine, Sulfa antibiotics, Meperidine hcl,  Nsaids, and Tolmetin    Review of Systems   Review of Systems  Constitutional:  Negative for appetite change and fever.  HENT:  Negative for congestion.   Respiratory:  Positive for shortness of breath. Negative for cough and chest tightness.   Cardiovascular:  Positive for leg swelling. Negative for chest pain.  Gastrointestinal:  Negative for abdominal pain, nausea and vomiting.  Genitourinary:  Negative for dysuria and hematuria.  Musculoskeletal:  Negative for arthralgias and myalgias.  Skin:  Negative for rash.  Neurological:  Negative for dizziness, weakness and headaches. Facial asymmetry: .ro.   all other systems are negative except as noted in the HPI and PMH.    Physical Exam Updated Vital Signs BP (!) 149/66   Pulse (!) 58   Temp 98.4 F (36.9 C) (Axillary)   Resp (!) 23   Ht 5\' 8"  (1.727 m)   Wt 86.1 kg   SpO2 96%   BMI 28.86 kg/m  Physical Exam Vitals and nursing note reviewed.  Constitutional:      General: She is not in acute distress.    Appearance: She is well-developed.     Comments: Hoarse voice  HENT:     Head: Normocephalic and atraumatic.     Mouth/Throat:     Pharynx: No oropharyngeal exudate.  Eyes:     Conjunctiva/sclera: Conjunctivae normal.     Pupils: Pupils are equal, round, and reactive to light.  Neck:     Comments: No meningismus. Cardiovascular:     Rate and Rhythm: Normal rate. Rhythm irregular.     Heart sounds: Normal heart sounds. No murmur heard. Pulmonary:     Effort: Pulmonary effort is normal. No respiratory distress.     Breath sounds: Rales present.     Comments: Bibasilar crackles Abdominal:     Palpations: Abdomen is soft.     Tenderness: There is no abdominal tenderness. There is no guarding or rebound.  Musculoskeletal:        General: No tenderness. Normal range of motion.     Cervical back: Normal range of motion and neck supple.     Right lower leg: Edema present.     Left lower leg: Edema present.  Skin:     General: Skin is warm.  Neurological:     Mental Status: She is alert and oriented to person, place, and time.     Cranial Nerves: No cranial nerve deficit.     Motor: No abnormal muscle tone.     Coordination: Coordination normal.     Comments:  5/5 strength throughout. CN 2-12 intact.Equal grip strength.   Psychiatric:        Behavior: Behavior normal.     ED Results / Procedures / Treatments   Labs (all labs ordered are listed, but only abnormal results are displayed) Labs Reviewed  CBC WITH DIFFERENTIAL/PLATELET - Abnormal; Notable  for the following components:      Result Value   WBC 18.7 (*)    Hemoglobin 11.3 (*)    Neutro Abs 16.5 (*)    Lymphs Abs 0.4 (*)    Monocytes Absolute 1.9 (*)    All other components within normal limits  COMPREHENSIVE METABOLIC PANEL - Abnormal; Notable for the following components:   Potassium 5.4 (*)    Glucose, Bld 131 (*)    BUN 44 (*)    Creatinine, Ser 1.09 (*)    Albumin 2.7 (*)    Alkaline Phosphatase 141 (*)    GFR, Estimated 46 (*)    All other components within normal limits  BRAIN NATRIURETIC PEPTIDE - Abnormal; Notable for the following components:   B Natriuretic Peptide 206.0 (*)    All other components within normal limits  URINALYSIS, ROUTINE W REFLEX MICROSCOPIC - Abnormal; Notable for the following components:   Color, Urine AMBER (*)    APPearance HAZY (*)    All other components within normal limits  I-STAT ARTERIAL BLOOD GAS, ED - Abnormal; Notable for the following components:   pH, Arterial 7.315 (*)    pCO2 arterial 66.0 (*)    pO2, Arterial 76 (*)    Bicarbonate 33.6 (*)    TCO2 36 (*)    Acid-Base Excess 6.0 (*)    HCT 35.0 (*)    Hemoglobin 11.9 (*)    All other components within normal limits  TROPONIN I (HIGH SENSITIVITY) - Abnormal; Notable for the following components:   Troponin I (High Sensitivity) 40 (*)    All other components within normal limits  TROPONIN I (HIGH SENSITIVITY) - Abnormal;  Notable for the following components:   Troponin I (High Sensitivity) 39 (*)    All other components within normal limits  SARS CORONAVIRUS 2 BY RT PCR  CULTURE, BLOOD (ROUTINE X 2)  CULTURE, BLOOD (ROUTINE X 2)  MRSA NEXT GEN BY PCR, NASAL  RESPIRATORY PANEL BY PCR  LACTIC ACID, PLASMA  LACTIC ACID, PLASMA  BLOOD GAS, VENOUS  BASIC METABOLIC PANEL  CBC  MAGNESIUM  PHOSPHORUS    EKG EKG Interpretation Date/Time:  Friday December 11 2022 23:19:20 EDT Ventricular Rate:  73 PR Interval:    QRS Duration:  143 QT Interval:  452 QTC Calculation: 499 R Axis:   46  Text Interpretation: Atrial flutter with predominant 3:1 AV block Nonspecific intraventricular conduction delay Abnormal T, consider ischemia, lateral leads No significant change was found Confirmed by Glynn Octave [phone]) on 12/11/2022 11:31:31 PM  Radiology DG Chest Portable 1 View  Result Date: 12/12/2022 CLINICAL DATA:  Shortness of breath. EXAM: PORTABLE CHEST 1 VIEW COMPARISON:  March 26, 2020 FINDINGS: The cardiac silhouette is mildly enlarged which may be, in part, secondary to low lung volumes. Mild, diffusely increased interstitial lung markings are noted. Moderate severity areas of atelectasis and/or infiltrate are seen within the right upper lobe and bilateral lung bases. Small bilateral pleural effusions are also present. No pneumothorax is identified. Radiopaque surgical clips are seen along the right axilla and lateral right chest wall. Marked severity degenerative changes are seen involving both shoulders with multilevel degenerative changes noted throughout the thoracic spine. IMPRESSION: 1. Mild cardiomegaly with mild interstitial edema. 2. Moderate severity right upper lobe and bilateral basilar atelectasis and/or infiltrate. 3. Small bilateral pleural effusions. Electronically Signed   By: Aram Candela M.D.   On: 12/12/2022 01:33   CT Angio Chest PE W and/or Wo Contrast  Result  Date:  12/12/2022 CLINICAL DATA:  Shortness of breath and difficulty breathing. EXAM: CT ANGIOGRAPHY CHEST WITH CONTRAST TECHNIQUE: Multidetector CT imaging of the chest was performed using the standard protocol during bolus administration of intravenous contrast. Multiplanar CT image reconstructions and MIPs were obtained to evaluate the vascular anatomy. RADIATION DOSE REDUCTION: This exam was performed according to the departmental dose-optimization program which includes automated exposure control, adjustment of the mA and/or kV according to patient size and/or use of iterative reconstruction technique. CONTRAST:  70mL OMNIPAQUE IOHEXOL 350 MG/ML SOLN COMPARISON:  None Available. FINDINGS: Cardiovascular: There is mild to moderate severity calcification of the aortic arch and descending thoracic aorta, without evidence of aortic aneurysm. Satisfactory opacification of the pulmonary arteries to the segmental level. No evidence of pulmonary embolism. The heart is borderline in size, with a small (approximately 7 mm thick) pericardial effusion. Mediastinum/Nodes: There is mild pretracheal lymphadenopathy. Thyroid gland, trachea, and esophagus demonstrate no significant findings. Lungs/Pleura: Mild, hazy bilateral upper lobe atelectasis and/or infiltrate is seen with moderate severity areas of linear scarring and/or atelectasis noted within the bilateral upper lobes and right middle lobe. Moderate to marked severity bilateral lower lobe compressive atelectasis is also seen. There are small bilateral pleural effusions. No pneumothorax is identified. Upper Abdomen: No acute abnormality. Musculoskeletal: A fracture deformity, and subsequent wedging, of the L1 vertebral body is seen. This involves predominantly the anterior aspect of the superior endplate and is of indeterminate age. Marked severity degenerative changes are seen involving both shoulders with multilevel degenerative changes noted throughout the thoracic spine.  Review of the MIP images confirms the above findings. IMPRESSION: 1. No evidence of pulmonary embolism. 2. Small bilateral pleural effusions with moderate to marked severity bilateral lower lobe compressive atelectasis. 3. Mild, hazy bilateral upper lobe atelectasis and/or infiltrate with moderate severity areas of bilateral upper lobe and right middle lobe linear scarring and/or atelectasis. 4. Small pericardial effusion. 5. Fracture deformity of the L1 vertebral body of indeterminate age. 6. Aortic atherosclerosis. Aortic Atherosclerosis (ICD10-I70.0). Electronically Signed   By: Aram Candela M.D.   On: 12/12/2022 01:29    Procedures .Critical Care  Performed by: Glynn Octave, MD Authorized by: Glynn Octave, MD   Critical care provider statement:    Critical care time (minutes):  35   Critical care time was exclusive of:  Separately billable procedures and treating other patients   Critical care was necessary to treat or prevent imminent or life-threatening deterioration of the following conditions:  Respiratory failure   Critical care was time spent personally by me on the following activities:  Development of treatment plan with patient or surrogate, discussions with consultants, evaluation of patient's response to treatment, examination of patient, ordering and review of laboratory studies, ordering and review of radiographic studies, ordering and performing treatments and interventions, pulse oximetry, re-evaluation of patient's condition, review of old charts, blood draw for specimens and obtaining history from patient or surrogate   I assumed direction of critical care for this patient from another provider in my specialty: no     Care discussed with: admitting provider       Medications Ordered in ED Medications  ipratropium-albuterol (DUONEB) 0.5-2.5 (3) MG/3ML nebulizer solution 3 mL (has no administration in time range)  methylPREDNISolone sodium succinate (SOLU-MEDROL)  125 mg/2 mL injection 125 mg (has no administration in time range)    ED Course/ Medical Decision Making/ A&P  Medical Decision Making Amount and/or Complexity of Data Reviewed Independent Historian: EMS Labs: ordered. Decision-making details documented in ED Course. Radiology: ordered and independent interpretation performed. Decision-making details documented in ED Course. ECG/medicine tests: ordered and independent interpretation performed. Decision-making details documented in ED Course.  Risk Prescription drug management. Decision regarding hospitalization.   Patient from facility with shortness of breath and hypoxia onset today.  Does have a history of COPD.  On arrival she is wheezing with diminished breath sounds and crackles bilaterally.  Give bronchodilators and steroids.  Check labs and chest x-ray.  EKG shows atrial fibrillation without acute ST changes.  Patient transition to high flow nasal cannula oxygen but desaturates to the mid 80s.  Chest x-ray concerning for interstitial edema and atelectasis.  Results reviewed interpreted by me.  Concern for likely CHF exacerbation plus possible pneumonia.  She is given antibiotics as well as IV Lasix.  Will check ABG.  No evidence of pulmonary embolism on CT scan.  Does show evidence of bilateral pleural effusions, interstitial edema and airspace disease.  Last echocardiogram in 2015 showed EF of 60 to 65% with LVH.  Labs show leukocytosis of 18. Potassium 5.7, pH 7.3 with pCO2 66  Patient transition to BiPAP for increased work of breathing and hypoxia nasal cannula.  Will treat with antibiotics as well as IV Lasix.  Admission discussed with Dr. Lazarus Salines.  Discussed with Dr. Lazarus Salines.  He recommends transition to high flow nasal cannula oxygen.  He agrees with diuresis as well as antibiotics for suspected pneumonia with volume overload.  Patient denies chest pain.  Maintaining oxygenation on 6 L  high flow nasal cannula oxygen.       Final Clinical Impression(s) / ED Diagnoses Final diagnoses:  Acute respiratory failure with hypoxia [facility])    Rx / DC Orders ED Discharge Orders     None         Ahava Kissoon, Jeannett Senior, MD 12/12/22 [phone]

## 2022-12-12 ENCOUNTER — Inpatient Hospital Stay (HOSPITAL_COMMUNITY): Payer: Medicare PPO

## 2022-12-12 ENCOUNTER — Other Ambulatory Visit: Payer: Self-pay

## 2022-12-12 ENCOUNTER — Emergency Department (HOSPITAL_COMMUNITY): Payer: Medicare PPO

## 2022-12-12 DIAGNOSIS — L97518 Non-pressure chronic ulcer of other part of right foot with other specified severity: Secondary | ICD-10-CM | POA: Diagnosis present

## 2022-12-12 DIAGNOSIS — M1990 Unspecified osteoarthritis, unspecified site: Secondary | ICD-10-CM | POA: Diagnosis present

## 2022-12-12 DIAGNOSIS — Z515 Encounter for palliative care: Secondary | ICD-10-CM | POA: Diagnosis not present

## 2022-12-12 DIAGNOSIS — I4892 Unspecified atrial flutter: Secondary | ICD-10-CM | POA: Diagnosis present

## 2022-12-12 DIAGNOSIS — J449 Chronic obstructive pulmonary disease, unspecified: Secondary | ICD-10-CM | POA: Diagnosis present

## 2022-12-12 DIAGNOSIS — R0602 Shortness of breath: Secondary | ICD-10-CM | POA: Diagnosis present

## 2022-12-12 DIAGNOSIS — J690 Pneumonitis due to inhalation of food and vomit: Secondary | ICD-10-CM | POA: Diagnosis present

## 2022-12-12 DIAGNOSIS — I2489 Other forms of acute ischemic heart disease: Secondary | ICD-10-CM | POA: Diagnosis present

## 2022-12-12 DIAGNOSIS — Z66 Do not resuscitate: Secondary | ICD-10-CM | POA: Diagnosis not present

## 2022-12-12 DIAGNOSIS — I5033 Acute on chronic diastolic (congestive) heart failure: Secondary | ICD-10-CM | POA: Diagnosis present

## 2022-12-12 DIAGNOSIS — J9622 Acute and chronic respiratory failure with hypercapnia: Secondary | ICD-10-CM | POA: Diagnosis present

## 2022-12-12 DIAGNOSIS — E8729 Other acidosis: Secondary | ICD-10-CM | POA: Diagnosis present

## 2022-12-12 DIAGNOSIS — I5A Non-ischemic myocardial injury (non-traumatic): Secondary | ICD-10-CM | POA: Diagnosis present

## 2022-12-12 DIAGNOSIS — I110 Hypertensive heart disease with heart failure: Secondary | ICD-10-CM | POA: Diagnosis present

## 2022-12-12 DIAGNOSIS — I70261 Atherosclerosis of native arteries of extremities with gangrene, right leg: Secondary | ICD-10-CM | POA: Diagnosis present

## 2022-12-12 DIAGNOSIS — Z7189 Other specified counseling: Secondary | ICD-10-CM | POA: Diagnosis not present

## 2022-12-12 DIAGNOSIS — E039 Hypothyroidism, unspecified: Secondary | ICD-10-CM | POA: Diagnosis present

## 2022-12-12 DIAGNOSIS — Z79890 Hormone replacement therapy (postmenopausal): Secondary | ICD-10-CM | POA: Diagnosis not present

## 2022-12-12 DIAGNOSIS — M86671 Other chronic osteomyelitis, right ankle and foot: Secondary | ICD-10-CM | POA: Diagnosis present

## 2022-12-12 DIAGNOSIS — I4891 Unspecified atrial fibrillation: Secondary | ICD-10-CM | POA: Diagnosis present

## 2022-12-12 DIAGNOSIS — Z1152 Encounter for screening for COVID-19: Secondary | ICD-10-CM | POA: Diagnosis not present

## 2022-12-12 DIAGNOSIS — J9601 Acute respiratory failure with hypoxia: Secondary | ICD-10-CM | POA: Diagnosis present

## 2022-12-12 DIAGNOSIS — Z9981 Dependence on supplemental oxygen: Secondary | ICD-10-CM | POA: Diagnosis not present

## 2022-12-12 DIAGNOSIS — R1310 Dysphagia, unspecified: Secondary | ICD-10-CM | POA: Diagnosis not present

## 2022-12-12 DIAGNOSIS — L89610 Pressure ulcer of right heel, unstageable: Secondary | ICD-10-CM | POA: Diagnosis present

## 2022-12-12 DIAGNOSIS — J9621 Acute and chronic respiratory failure with hypoxia: Secondary | ICD-10-CM | POA: Diagnosis present

## 2022-12-12 DIAGNOSIS — R54 Age-related physical debility: Secondary | ICD-10-CM | POA: Diagnosis present

## 2022-12-12 LAB — I-STAT ARTERIAL BLOOD GAS, ED
Acid-Base Excess: 6 mmol/L — ABNORMAL HIGH (ref 0.0–2.0)
Acid-Base Excess: 6 mmol/L — ABNORMAL HIGH (ref 0.0–2.0)
Bicarbonate: 33.6 mmol/L — ABNORMAL HIGH (ref 20.0–28.0)
Bicarbonate: 33.5 mmol/L — ABNORMAL HIGH (ref 20.0–28.0)
Calcium, Ion: 1.19 mmol/L (ref 1.15–1.40)
Calcium, Ion: 1.12 mmol/L — ABNORMAL LOW (ref 1.15–1.40)
HCT: 35 % — ABNORMAL LOW (ref 36.0–46.0)
HCT: 34 % — ABNORMAL LOW (ref 36.0–46.0)
Hemoglobin: 11.9 g/dL — ABNORMAL LOW (ref 12.0–15.0)
Hemoglobin: 11.6 g/dL — ABNORMAL LOW (ref 12.0–15.0)
O2 Saturation: 93 %
O2 Saturation: 93 %
Patient temperature: 98.7
Patient temperature: 98.4
Potassium: 4.7 mmol/L (ref 3.5–5.1)
Potassium: 4.7 mmol/L (ref 3.5–5.1)
Sodium: 139 mmol/L (ref 135–145)
Sodium: 139 mmol/L (ref 135–145)
TCO2: 36 mmol/L — ABNORMAL HIGH (ref 22–32)
TCO2: 35 mmol/L — ABNORMAL HIGH (ref 22–32)
pCO2 arterial: 66 mm[Hg] (ref 32–48)
pCO2 arterial: 63.3 mm[Hg] — ABNORMAL HIGH (ref 32–48)
pH, Arterial: 7.315 — ABNORMAL LOW (ref 7.35–7.45)
pH, Arterial: 7.331 — ABNORMAL LOW (ref 7.35–7.45)
pO2, Arterial: 76 mm[Hg] — ABNORMAL LOW (ref 83–108)
pO2, Arterial: 74 mm[Hg] — ABNORMAL LOW (ref 83–108)

## 2022-12-12 LAB — MAGNESIUM: Magnesium: 2.3 mg/dL (ref 1.7–2.4)

## 2022-12-12 LAB — TROPONIN I (HIGH SENSITIVITY)
Troponin I (High Sensitivity): 40 ng/L — ABNORMAL HIGH (ref <18)
Troponin I (High Sensitivity): 39 ng/L — ABNORMAL HIGH (ref <18)

## 2022-12-12 LAB — RESPIRATORY PANEL BY PCR

## 2022-12-12 LAB — CBC WITH DIFFERENTIAL/PLATELET
Abs Immature Granulocytes: 0 10*3/uL (ref 0.00–0.07)
Basophils Absolute: 0 10*3/uL (ref 0.0–0.1)
Basophils Relative: 0 %
Eosinophils Absolute: 0 10*3/uL (ref 0.0–0.5)
Eosinophils Relative: 0 %
HCT: 37.3 % (ref 36.0–46.0)
Hemoglobin: 11.3 g/dL — ABNORMAL LOW (ref 12.0–15.0)
Lymphocytes Relative: 2 %
Lymphs Abs: 0.4 10*3/uL — ABNORMAL LOW (ref 0.7–4.0)
MCH: 28.9 pg (ref 26.0–34.0)
MCHC: 30.3 g/dL (ref 30.0–36.0)
MCV: 95.4 fL (ref 80.0–100.0)
Monocytes Absolute: 1.9 10*3/uL — ABNORMAL HIGH (ref 0.1–1.0)
Monocytes Relative: 10 %
Neutro Abs: 16.5 10*3/uL — ABNORMAL HIGH (ref 1.7–7.7)
Neutrophils Relative %: 88 %
Platelets: 382 10*3/uL (ref 150–400)
RBC: 3.91 MIL/uL (ref 3.87–5.11)
RDW: 14.5 % (ref 11.5–15.5)
WBC: 18.7 10*3/uL — ABNORMAL HIGH (ref 4.0–10.5)
nRBC: 0 % (ref 0.0–0.2)
nRBC: 0 /100{WBCs}

## 2022-12-12 LAB — COMPREHENSIVE METABOLIC PANEL
ALT: 30 U/L (ref 0–44)
AST: 26 U/L (ref 15–41)
Albumin: 2.7 g/dL — ABNORMAL LOW (ref 3.5–5.0)
Alkaline Phosphatase: 141 U/L — ABNORMAL HIGH (ref 38–126)
Anion gap: 15 (ref 5–15)
BUN: 44 mg/dL — ABNORMAL HIGH (ref 8–23)
CO2: 28 mmol/L (ref 22–32)
Calcium: 9.3 mg/dL (ref 8.9–10.3)
Chloride: 100 mmol/L (ref 98–111)
Creatinine, Ser: 1.09 mg/dL — ABNORMAL HIGH (ref 0.44–1.00)
GFR, Estimated: 46 mL/min — ABNORMAL LOW (ref >60)
Glucose, Bld: 131 mg/dL — ABNORMAL HIGH (ref 70–99)
Potassium: 5.4 mmol/L — ABNORMAL HIGH (ref 3.5–5.1)
Sodium: 143 mmol/L (ref 135–145)
Total Bilirubin: 0.8 mg/dL (ref 0.3–1.2)
Total Protein: 7.1 g/dL (ref 6.5–8.1)

## 2022-12-12 LAB — CBC
HCT: 36.1 % (ref 36.0–46.0)
HCT: 35.1 % — ABNORMAL LOW (ref 36.0–46.0)
Hemoglobin: 11.1 g/dL — ABNORMAL LOW (ref 12.0–15.0)
Hemoglobin: 10.8 g/dL — ABNORMAL LOW (ref 12.0–15.0)
MCH: 29.7 pg (ref 26.0–34.0)
MCH: 29.8 pg (ref 26.0–34.0)
MCHC: 30.7 g/dL (ref 30.0–36.0)
MCHC: 30.8 g/dL (ref 30.0–36.0)
MCV: 96.5 fL (ref 80.0–100.0)
MCV: 97 fL (ref 80.0–100.0)
Platelets: 372 10*3/uL (ref 150–400)
Platelets: 345 10*3/uL (ref 150–400)
RBC: 3.74 MIL/uL — ABNORMAL LOW (ref 3.87–5.11)
RBC: 3.62 MIL/uL — ABNORMAL LOW (ref 3.87–5.11)
RDW: 14.3 % (ref 11.5–15.5)
RDW: 14.5 % (ref 11.5–15.5)
WBC: 13.3 10*3/uL — ABNORMAL HIGH (ref 4.0–10.5)
WBC: 11.3 10*3/uL — ABNORMAL HIGH (ref 4.0–10.5)
nRBC: 0 % (ref 0.0–0.2)
nRBC: 0 % (ref 0.0–0.2)

## 2022-12-12 LAB — BASIC METABOLIC PANEL
Anion gap: 11 (ref 5–15)
BUN: 41 mg/dL — ABNORMAL HIGH (ref 8–23)
CO2: 29 mmol/L (ref 22–32)
Calcium: 8.8 mg/dL — ABNORMAL LOW (ref 8.9–10.3)
Chloride: 100 mmol/L (ref 98–111)
Creatinine, Ser: 1.23 mg/dL — ABNORMAL HIGH (ref 0.44–1.00)
GFR, Estimated: 40 mL/min — ABNORMAL LOW (ref >60)
Glucose, Bld: 168 mg/dL — ABNORMAL HIGH (ref 70–99)
Potassium: 4.7 mmol/L (ref 3.5–5.1)
Sodium: 140 mmol/L (ref 135–145)

## 2022-12-12 LAB — URINALYSIS, ROUTINE W REFLEX MICROSCOPIC
Bilirubin Urine: NEGATIVE
Glucose, UA: NEGATIVE mg/dL
Hgb urine dipstick: NEGATIVE
Ketones, ur: NEGATIVE mg/dL
Leukocytes,Ua: NEGATIVE
Nitrite: NEGATIVE
Protein, ur: NEGATIVE mg/dL
Specific Gravity, Urine: 1.018 (ref 1.005–1.030)
pH: 5 (ref 5.0–8.0)

## 2022-12-12 LAB — CREATININE, SERUM
Creatinine, Ser: 0.92 mg/dL (ref 0.44–1.00)
GFR, Estimated: 57 mL/min — ABNORMAL LOW (ref >60)

## 2022-12-12 LAB — PROCALCITONIN: Procalcitonin: 0.49 ng/mL

## 2022-12-12 LAB — LACTIC ACID, PLASMA: Lactic Acid, Venous: 1.1 mmol/L (ref 0.5–1.9)

## 2022-12-12 LAB — GLUCOSE, CAPILLARY: Glucose-Capillary: 138 mg/dL — ABNORMAL HIGH (ref 70–99)

## 2022-12-12 LAB — PHOSPHORUS: Phosphorus: 4.5 mg/dL (ref 2.5–4.6)

## 2022-12-12 LAB — BRAIN NATRIURETIC PEPTIDE: B Natriuretic Peptide: 206 pg/mL — ABNORMAL HIGH (ref 0.0–100.0)

## 2022-12-12 LAB — MRSA NEXT GEN BY PCR, NASAL: MRSA by PCR Next Gen: NOT DETECTED

## 2022-12-12 LAB — SARS CORONAVIRUS 2 BY RT PCR: SARS Coronavirus 2 by RT PCR: NEGATIVE

## 2022-12-12 MED ORDER — SODIUM CHLORIDE 0.9 % IV SOLN
1.0000 g | Freq: Once | INTRAVENOUS | Status: AC
  Administered 2022-12-12: 1 g via INTRAVENOUS
  Filled 2022-12-12: qty 10

## 2022-12-12 MED ORDER — ENOXAPARIN SODIUM 40 MG/0.4ML IJ SOSY
40.0000 mg | PREFILLED_SYRINGE | INTRAMUSCULAR | Status: DC
  Administered 2022-12-12 – 2022-12-16 (×5): 40 mg via SUBCUTANEOUS
  Filled 2022-12-12 (×5): qty 0.4

## 2022-12-12 MED ORDER — ORAL CARE MOUTH RINSE
15.0000 mL | OROMUCOSAL | Status: DC
  Administered 2022-12-12 – 2022-12-18 (×16): 15 mL via OROMUCOSAL

## 2022-12-12 MED ORDER — IPRATROPIUM-ALBUTEROL 0.5-2.5 (3) MG/3ML IN SOLN
3.0000 mL | RESPIRATORY_TRACT | Status: DC
  Administered 2022-12-12 – 2022-12-13 (×6): 3 mL via RESPIRATORY_TRACT
  Filled 2022-12-12 (×5): qty 3

## 2022-12-12 MED ORDER — DOCUSATE SODIUM 100 MG PO CAPS
100.0000 mg | ORAL_CAPSULE | Freq: Two times a day (BID) | ORAL | Status: DC | PRN
  Administered 2022-12-16: 100 mg via ORAL
  Filled 2022-12-12: qty 1

## 2022-12-12 MED ORDER — FUROSEMIDE 10 MG/ML IJ SOLN
80.0000 mg | Freq: Once | INTRAMUSCULAR | Status: AC
  Administered 2022-12-12: 80 mg via INTRAVENOUS
  Filled 2022-12-12: qty 8

## 2022-12-12 MED ORDER — ACETAZOLAMIDE SODIUM 500 MG IJ SOLR
500.0000 mg | Freq: Once | INTRAMUSCULAR | Status: AC
  Administered 2022-12-12: 500 mg via INTRAVENOUS
  Filled 2022-12-12: qty 500

## 2022-12-12 MED ORDER — IOHEXOL 350 MG/ML SOLN
70.0000 mL | Freq: Once | INTRAVENOUS | Status: AC | PRN
  Administered 2022-12-12: 70 mL via INTRAVENOUS

## 2022-12-12 MED ORDER — ALBUTEROL SULFATE (2.5 MG/3ML) 0.083% IN NEBU: 2.5000 mg | INHALATION_SOLUTION | RESPIRATORY_TRACT | Status: DC | PRN

## 2022-12-12 MED ORDER — VANCOMYCIN HCL 750 MG/150ML IV SOLN: 750.0000 mg | INTRAVENOUS | Status: DC

## 2022-12-12 MED ORDER — ORAL CARE MOUTH RINSE: 15.0000 mL | OROMUCOSAL | Status: DC | PRN

## 2022-12-12 MED ORDER — SODIUM CHLORIDE 0.9 % IV SOLN
2.0000 g | Freq: Two times a day (BID) | INTRAVENOUS | Status: DC
  Administered 2022-12-12 – 2022-12-14 (×5): 2 g via INTRAVENOUS
  Filled 2022-12-12 (×5): qty 12.5

## 2022-12-12 MED ORDER — LACTATED RINGERS IV SOLN: INTRAVENOUS | Status: DC

## 2022-12-12 MED ORDER — SODIUM CHLORIDE 0.9 % IV SOLN: 2.0000 g | Freq: Three times a day (TID) | INTRAVENOUS | Status: DC

## 2022-12-12 MED ORDER — VANCOMYCIN HCL IN DEXTROSE 1-5 GM/200ML-% IV SOLN
1000.0000 mg | INTRAVENOUS | Status: DC
  Administered 2022-12-13: 1000 mg via INTRAVENOUS
  Filled 2022-12-12: qty 200

## 2022-12-12 MED ORDER — SODIUM CHLORIDE 0.9% FLUSH
3.0000 mL | Freq: Two times a day (BID) | INTRAVENOUS | Status: DC
  Administered 2022-12-12 – 2022-12-18 (×10): 3 mL via INTRAVENOUS

## 2022-12-12 MED ORDER — IPRATROPIUM-ALBUTEROL 0.5-2.5 (3) MG/3ML IN SOLN: 3.0000 mL | Freq: Four times a day (QID) | RESPIRATORY_TRACT | Status: DC

## 2022-12-12 MED ORDER — SODIUM CHLORIDE 0.9 % IV SOLN
500.0000 mg | Freq: Once | INTRAVENOUS | Status: AC
  Administered 2022-12-12: 500 mg via INTRAVENOUS
  Filled 2022-12-12: qty 5

## 2022-12-12 MED ORDER — CHLORHEXIDINE GLUCONATE CLOTH 2 % EX PADS
6.0000 | MEDICATED_PAD | Freq: Every day | CUTANEOUS | Status: DC
  Administered 2022-12-12 – 2022-12-13 (×2): 6 via TOPICAL

## 2022-12-12 MED ORDER — VANCOMYCIN HCL 2000 MG/400ML IV SOLN
2000.0000 mg | Freq: Once | INTRAVENOUS | Status: AC
  Administered 2022-12-12: 2000 mg via INTRAVENOUS
  Filled 2022-12-12: qty 400

## 2022-12-12 MED ORDER — POLYETHYLENE GLYCOL 3350 17 G PO PACK
17.0000 g | PACK | Freq: Every day | ORAL | Status: DC | PRN
  Administered 2022-12-16: 17 g via ORAL
  Filled 2022-12-12: qty 1

## 2022-12-12 NOTE — ED Notes (Signed)
ED TO INPATIENT HANDOFF REPORT  ED Nurse Name and Phone #: Laquinda Moller/Isaac [phone]  S Name/Age/Gender [name] 89 y.o. female Room/Bed: 018C/018C  Code Status   Code Status: Full Code  Home/SNF/Other Skilled nursing facility Patient oriented to: self and situation Is this baseline? Yes   Triage Complete: Triage complete  Chief Complaint Acute hypoxic respiratory failure (HCC) [J96.01] Acute respiratory failure with hypoxemia (HCC) [J96.01]  Triage Note Pt BIB EMS [facility]. Facility called ems with concerns of pneumonia, SHOB, crackles in lungs, difficulty speaking r/t hoarse voice. 83-89% on RA.   A&Ox4,RA restriction Hx of copd, chf, pneumonia   EMS VS: 142/88, cbg 134, HR 80's hx afib, Temp 98.3, 96% on 4L [place]   Allergies Allergies  Allergen Reactions   Morphine And Codeine Other (See Comments)    Other Reaction: mild Intolerance, Allergy   Sulfa Antibiotics     Other reaction(s): Other (See Comments) Other Reaction: mild Intolerance, Allergy   Meperidine Hcl Nausea Only   Nsaids     Other reaction(s): Other (See Comments) Bleeding ulcer   Tolmetin Other (See Comments)    Other reaction(s): Other (See Comments) Bleeding ulcer    Level of Care/Admitting Diagnosis ED Disposition     ED Disposition  Admit   Condition  --   Comment  Hospital Area: [facility] [[id number]]  Level of Care: ICU [6]  May admit patient to [facility] or [facility] if equivalent level of care is available:: Yes  Covid Evaluation: Confirmed COVID Negative  Diagnosis: Acute respiratory failure with hypoxemia [facility]) [[id number]]  Admitting Physician: Tomma Lightning [[id number]]  Attending Physician: Tomma Lightning [phone]  Certification:: I certify this patient will need inpatient services for at least 2 midnights          B Medical/Surgery History Past Medical History:  Diagnosis Date   Atrial fibrillation (HCC)     CARCINOMA, BREAST    CHEST PAIN    DEGENERATIVE JOINT DISEASE    Diastolic heart failure (HCC) 08/03/2008   Qualifier: Diagnosis of  By: Kem Parkinson     Difficult intubation    Edema    GIB (gastrointestinal bleeding) 05/24/2015   Humerus fracture 09/12/2020   HYPERTENSION    Long term (current) use of anticoagulants    Malignant neoplasm of female breast (HCC) 08/03/2008   Qualifier: Diagnosis of  By: Kem Parkinson     Unspecified diastolic heart failure    Past Surgical History:  Procedure Laterality Date   BREAST LUMPECTOMY     ESOPHAGOGASTRODUODENOSCOPY (EGD) WITH PROPOFOL Left 05/28/2015   Procedure: ESOPHAGOGASTRODUODENOSCOPY (EGD) WITH PROPOFOL;  Surgeon: Charlott Rakes, MD;  Location: [facility] ENDOSCOPY;  Service: Endoscopy;  Laterality: Left;   HIP SURGERY     KNEE SURGERY     LAMINECTOMY     ORIF FEMUR FRACTURE Right 08/08/2021   Procedure: OPEN REDUCTION INTERNAL FIXATION (ORIF) DISTAL FEMUR FRACTURE;  Surgeon: Joen Laura, MD;  Location: [facility] ORS;  Service: Orthopedics;  Laterality: Right;   TONSILLECTOMY       A IV Location/Drains/Wounds Patient Lines/Drains/Airways Status     Active Line/Drains/Airways     Name Placement date Placement time Site Days   Peripheral IV 12/11/22 22 G Anterior;Left Forearm 12/11/22  2320  Forearm  1   Peripheral IV 12/12/22 20 G 1.88" Anterior;Left;Proximal Forearm 12/12/22  0220  Forearm  less than 1   Wound / Incision (Open or Dehisced) 05/28/22 Venous stasis ulcer Heel  Right stage 3 ulcer to posterior lower leg/heel 05/28/22  2022  Heel  198   Wound / Incision (Open or Dehisced) 05/28/22  Tibial Posterior;Proximal;Right 05/28/22  2346  Tibial  198            Intake/Output Last 24 hours No intake or output data in the 24 hours ending 12/12/22 1026  Labs/Imaging Results for orders placed or performed during the hospital encounter of 12/11/22 (from the past 48 hour(s))  CBC with Differential     Status: Abnormal    Collection Time: 12/11/22 11:13 PM  Result Value Ref Range   WBC 18.7 (H) 4.0 - 10.5 K/uL   RBC 3.91 3.87 - 5.11 MIL/uL   Hemoglobin 11.3 (L) 12.0 - 15.0 g/dL   HCT 62.1 30.8 - 65.7 %   MCV 95.4 80.0 - 100.0 fL   MCH 28.9 26.0 - 34.0 pg   MCHC 30.3 30.0 - 36.0 g/dL   RDW 84.6 96.2 - 95.2 %   Platelets 382 150 - 400 K/uL   nRBC 0.0 0.0 - 0.2 %   Neutrophils Relative % 88 %   Neutro Abs 16.5 (H) 1.7 - 7.7 K/uL   Lymphocytes Relative 2 %   Lymphs Abs 0.4 (L) 0.7 - 4.0 K/uL   Monocytes Relative 10 %   Monocytes Absolute 1.9 (H) 0.1 - 1.0 K/uL   Eosinophils Relative 0 %   Eosinophils Absolute 0.0 0.0 - 0.5 K/uL   Basophils Relative 0 %   Basophils Absolute 0.0 0.0 - 0.1 K/uL   nRBC 0 0 /100 WBC   Abs Immature Granulocytes 0.00 0.00 - 0.07 K/uL    Comment: Performed at [facility], [address], [place], [place] [address]  Comprehensive metabolic panel     Status: Abnormal   Collection Time: 12/11/22 11:13 PM  Result Value Ref Range   Sodium 143 135 - 145 mmol/L   Potassium 5.4 (H) 3.5 - 5.1 mmol/L   Chloride 100 98 - 111 mmol/L   CO2 28 22 - 32 mmol/L   Glucose, Bld 131 (H) 70 - 99 mg/dL    Comment: Glucose reference range applies only to samples taken after fasting for at least 8 hours.   BUN 44 (H) 8 - 23 mg/dL   Creatinine, Ser 4.40 (H) 0.44 - 1.00 mg/dL   Calcium 9.3 8.9 - 10.2 mg/dL   Total Protein 7.1 6.5 - 8.1 g/dL   Albumin 2.7 (L) 3.5 - 5.0 g/dL   AST 26 15 - 41 U/L   ALT 30 0 - 44 U/L   Alkaline Phosphatase 141 (H) 38 - 126 U/L   Total Bilirubin 0.8 0.3 - 1.2 mg/dL   GFR, Estimated 46 (L) >60 mL/min    Comment: (NOTE) Calculated using the CKD-EPI Creatinine Equation (2021)    Anion gap 15 5 - 15    Comment: Performed at [facility], [address], [place], [place] [address]  Troponin I (High Sensitivity)     Status: Abnormal   Collection Time: 12/11/22 11:13 PM  Result Value Ref Range   Troponin I (High Sensitivity) 40 (H) <18 ng/L     Comment: (NOTE) Elevated high sensitivity troponin I (hsTnI) values and significant  changes across serial measurements may suggest ACS but many other  chronic and acute conditions are known to elevate hsTnI results.  Refer to the "Links" section for chest pain algorithms and additional  guidance. Performed at [facility], [address], [place], [place] [address]   Brain natriuretic peptide     Status: Abnormal   Collection Time: 12/11/22 11:13 PM  Result Value Ref Range   B Natriuretic Peptide 206.0 (H) 0.0 - 100.0 pg/mL    Comment: Performed at [facility], [address] [place], [place] [address]  SARS Coronavirus 2 by RT PCR (hospital order, performed in [facility] hospital lab) *cepheid single result test* Anterior Nasal Swab     Status: None   Collection Time: 12/11/22 11:13 PM   Specimen: Anterior Nasal Swab  Result Value Ref Range   SARS Coronavirus 2 by RT PCR NEGATIVE NEGATIVE    Comment: Performed at [facility], [address], [place], [place] [address]  Troponin I (High Sensitivity)     Status: Abnormal   Collection Time: 12/12/22  1:43 AM  Result Value Ref Range   Troponin I (High Sensitivity) 39 (H) <18 ng/L    Comment: (NOTE) Elevated high sensitivity troponin I (hsTnI) values and significant  changes across serial measurements may suggest ACS but many other  chronic and acute conditions are known to elevate hsTnI results.  Refer to the "Links" section for chest pain algorithms and additional  guidance. Performed at [facility], [address] [place], [place] [address]   Lactic acid, plasma     Status: None   Collection Time: 12/12/22  1:43 AM  Result Value Ref Range   Lactic Acid, Venous 1.1 0.5 - 1.9 mmol/L    Comment: Performed at [facility], [address] [place], [place] [address]  Procalcitonin     Status: None   Collection Time: 12/12/22  1:43 AM  Result Value Ref Range   Procalcitonin 0.49 ng/mL     Comment:        Interpretation: PCT (Procalcitonin) <= 0.5 ng/mL: Systemic infection (sepsis) is not likely. Local bacterial infection is possible. (NOTE)       Sepsis PCT Algorithm           Lower Respiratory Tract                                      Infection PCT Algorithm    ----------------------------     ----------------------------         PCT < 0.25 ng/mL                PCT < 0.10 ng/mL          Strongly encourage             Strongly discourage   discontinuation of antibiotics    initiation of antibiotics    ----------------------------     -----------------------------       PCT 0.25 - 0.50 ng/mL            PCT 0.10 - 0.25 ng/mL               OR       >80% decrease in PCT            Discourage initiation of                                            antibiotics      Encourage discontinuation  of antibiotics    ----------------------------     -----------------------------         PCT >= 0.50 ng/mL              PCT 0.26 - 0.50 ng/mL               AND        <80% decrease in PCT             Encourage initiation of                                             antibiotics       Encourage continuation           of antibiotics    ----------------------------     -----------------------------        PCT >= 0.50 ng/mL                  PCT > 0.50 ng/mL               AND         increase in PCT                  Strongly encourage                                      initiation of antibiotics    Strongly encourage escalation           of antibiotics                                     -----------------------------                                           PCT <= 0.25 ng/mL                                                 OR                                        > 80% decrease in PCT                                      Discontinue / Do not initiate                                             antibiotics  Performed at [facility], [address],  [place], [place] [address]   I-Stat arterial blood gas, ED [facility], [facility], [facility])     Status: Abnormal   Collection Time: 12/12/22  2:12 AM  Result Value Ref Range  pH, Arterial 7.315 (L) 7.35 - 7.45   pCO2 arterial 66.0 (HH) 32 - 48 mmHg   pO2, Arterial 76 (L) 83 - 108 mmHg   Bicarbonate 33.6 (H) 20.0 - 28.0 mmol/L   TCO2 36 (H) 22 - 32 mmol/L   O2 Saturation 93 %   Acid-Base Excess 6.0 (H) 0.0 - 2.0 mmol/L   Sodium 139 135 - 145 mmol/L   Potassium 4.7 3.5 - 5.1 mmol/L   Calcium, Ion 1.19 1.15 - 1.40 mmol/L   HCT 35.0 (L) 36.0 - 46.0 %   Hemoglobin 11.9 (L) 12.0 - 15.0 g/dL   Patient temperature 96.0 F    Collection site RADIAL, ALLEN'S TEST ACCEPTABLE    Drawn by RT    Sample type ARTERIAL    Comment NOTIFIED PHYSICIAN   Blood culture (routine x 2)     Status: None (Preliminary result)   Collection Time: 12/12/22  2:28 AM   Specimen: BLOOD  Result Value Ref Range   Specimen Description BLOOD BLOOD LEFT ARM    Special Requests      BOTTLES DRAWN AEROBIC AND ANAEROBIC Blood Culture adequate volume   Culture      NO GROWTH < 12 HOURS Performed at [facility], [address], [place], [place] [address]    Report Status PENDING   Blood culture (routine x 2)     Status: None (Preliminary result)   Collection Time: 12/12/22  2:28 AM   Specimen: BLOOD  Result Value Ref Range   Specimen Description BLOOD BLOOD LEFT ARM    Special Requests      BOTTLES DRAWN AEROBIC AND ANAEROBIC Blood Culture adequate volume   Culture      NO GROWTH < 12 HOURS Performed at [facility], [address], [place], [place] [address]    Report Status PENDING   Urinalysis, Routine w reflex microscopic -Urine, Clean Catch     Status: Abnormal   Collection Time: 12/12/22  3:32 AM  Result Value Ref Range   Color, Urine AMBER (A) YELLOW    Comment: BIOCHEMICALS MAY BE AFFECTED BY COLOR   APPearance HAZY (A) CLEAR   Specific Gravity, Urine 1.018 1.005 - 1.030   pH 5.0 5.0 - 8.0   Glucose, UA  NEGATIVE NEGATIVE mg/dL   Hgb urine dipstick NEGATIVE NEGATIVE   Bilirubin Urine NEGATIVE NEGATIVE   Ketones, ur NEGATIVE NEGATIVE mg/dL   Protein, ur NEGATIVE NEGATIVE mg/dL   Nitrite NEGATIVE NEGATIVE   Leukocytes,Ua NEGATIVE NEGATIVE    Comment: Performed at [facility], [address] [place], [place] [address]  Basic metabolic panel     Status: Abnormal   Collection Time: 12/12/22  6:25 AM  Result Value Ref Range   Sodium 140 135 - 145 mmol/L   Potassium 4.7 3.5 - 5.1 mmol/L   Chloride 100 98 - 111 mmol/L   CO2 29 22 - 32 mmol/L   Glucose, Bld 168 (H) 70 - 99 mg/dL    Comment: Glucose reference range applies only to samples taken after fasting for at least 8 hours.   BUN 41 (H) 8 - 23 mg/dL   Creatinine, Ser 5.62 (H) 0.44 - 1.00 mg/dL   Calcium 8.8 (L) 8.9 - 10.3 mg/dL   GFR, Estimated 40 (L) >60 mL/min    Comment: (NOTE) Calculated using the CKD-EPI Creatinine Equation (2021)    Anion gap 11 5 - 15    Comment: Performed at [facility],  [address] [place] [address] [place], [place] [address]  CBC     Status: Abnormal   Collection Time: 12/12/22  6:25 AM  Result Value Ref Range   WBC 13.3 (H) 4.0 - 10.5 K/uL   RBC 3.74 (L) 3.87 - 5.11 MIL/uL   Hemoglobin 11.1 (L) 12.0 - 15.0 g/dL   HCT 91.4 78.2 - 95.6 %   MCV 96.5 80.0 - 100.0 fL   MCH 29.7 26.0 - 34.0 pg   MCHC 30.7 30.0 - 36.0 g/dL   RDW 21.3 08.6 - 57.8 %   Platelets 372 150 - 400 K/uL   nRBC 0.0 0.0 - 0.2 %    Comment: Performed at [facility], [address] [place], [place] [address]  Magnesium     Status: None   Collection Time: 12/12/22  6:25 AM  Result Value Ref Range   Magnesium 2.3 1.7 - 2.4 mg/dL    Comment: Performed at [facility], [address] [place], [place] [address]  Phosphorus     Status: None   Collection Time: 12/12/22  6:25 AM  Result Value Ref Range   Phosphorus 4.5 2.5 - 4.6 mg/dL    Comment: Performed at [facility], [address] [place], [place] [address]   I-Stat arterial blood gas, ED     Status: Abnormal   Collection Time: 12/12/22  7:03 AM  Result Value Ref Range   pH, Arterial 7.331 (L) 7.35 - 7.45   pCO2 arterial 63.3 (H) 32 - 48 mmHg   pO2, Arterial 74 (L) 83 - 108 mmHg   Bicarbonate 33.5 (H) 20.0 - 28.0 mmol/L   TCO2 35 (H) 22 - 32 mmol/L   O2 Saturation 93 %   Acid-Base Excess 6.0 (H) 0.0 - 2.0 mmol/L   Sodium 139 135 - 145 mmol/L   Potassium 4.7 3.5 - 5.1 mmol/L   Calcium, Ion 1.12 (L) 1.15 - 1.40 mmol/L   HCT 34.0 (L) 36.0 - 46.0 %   Hemoglobin 11.6 (L) 12.0 - 15.0 g/dL   Patient temperature 01.0 F    Collection site RADIAL, ALLEN'S TEST ACCEPTABLE    Drawn by RT    Sample type ARTERIAL    CT HEAD WO CONTRAST ( )  Result Date: 12/12/2022 CLINICAL DATA:  89 year old female with encephalopathy. Shortness of breath. EXAM: CT HEAD WITHOUT CONTRAST TECHNIQUE: Contiguous axial images were obtained from the base of the skull through the vertex without intravenous contrast. RADIATION DOSE REDUCTION: This exam was performed according to the departmental dose-optimization program which includes automated exposure control, adjustment of the mA and/or kV according to patient size and/or use of iterative reconstruction technique. COMPARISON:  Head CT 08/06/2021. FINDINGS: Brain: Stable cerebral volume. No midline shift, ventriculomegaly, mass effect, evidence of mass lesion, intracranial hemorrhage or evidence of cortically based acute infarction. Patchy and confluent bilateral cerebral white matter hypodensity, deep white matter capsule involvement, some deep gray nuclei heterogeneity, chronic occipital pole encephalomalacia on the right are stable. No abnormal enhancement identified. Vascular: Residual intravascular contrast from CTA 0104 hours today. Calcified atherosclerosis at the skull base. Skull: No acute osseous abnormality identified. Sinuses/Orbits: Visualized paranasal sinuses and mastoids are clear. Other: No acute orbit or scalp  soft tissue finding. IMPRESSION: 1. No acute intracranial abnormality identified. 2. Stable noncontrast CT appearance of chronic ischemic disease since last year. Electronically Signed   By: Odessa Fleming M.D.   On: 12/12/2022 07:15   DG Chest Portable 1 View  Result  Date: 12/12/2022 CLINICAL DATA:  Shortness of breath. EXAM: PORTABLE CHEST 1 VIEW COMPARISON:  March 26, 2020 FINDINGS: The cardiac silhouette is mildly enlarged which may be, in part, secondary to low lung volumes. Mild, diffusely increased interstitial lung markings are noted. Moderate severity areas of atelectasis and/or infiltrate are seen within the right upper lobe and bilateral lung bases. Small bilateral pleural effusions are also present. No pneumothorax is identified. Radiopaque surgical clips are seen along the right axilla and lateral right chest wall. Marked severity degenerative changes are seen involving both shoulders with multilevel degenerative changes noted throughout the thoracic spine. IMPRESSION: 1. Mild cardiomegaly with mild interstitial edema. 2. Moderate severity right upper lobe and bilateral basilar atelectasis and/or infiltrate. 3. Small bilateral pleural effusions. Electronically Signed   By: Aram Candela M.D.   On: 12/12/2022 01:33   CT Angio Chest PE W and/or Wo Contrast  Result Date: 12/12/2022 CLINICAL DATA:  Shortness of breath and difficulty breathing. EXAM: CT ANGIOGRAPHY CHEST WITH CONTRAST TECHNIQUE: Multidetector CT imaging of the chest was performed using the standard protocol during bolus administration of intravenous contrast. Multiplanar CT image reconstructions and MIPs were obtained to evaluate the vascular anatomy. RADIATION DOSE REDUCTION: This exam was performed according to the departmental dose-optimization program which includes automated exposure control, adjustment of the mA and/or kV according to patient size and/or use of iterative reconstruction technique. CONTRAST:  70mL OMNIPAQUE  IOHEXOL 350 MG/ML SOLN COMPARISON:  None Available. FINDINGS: Cardiovascular: There is mild to moderate severity calcification of the aortic arch and descending thoracic aorta, without evidence of aortic aneurysm. Satisfactory opacification of the pulmonary arteries to the segmental level. No evidence of pulmonary embolism. The heart is borderline in size, with a small (approximately 7 mm thick) pericardial effusion. Mediastinum/Nodes: There is mild pretracheal lymphadenopathy. Thyroid gland, trachea, and esophagus demonstrate no significant findings. Lungs/Pleura: Mild, hazy bilateral upper lobe atelectasis and/or infiltrate is seen with moderate severity areas of linear scarring and/or atelectasis noted within the bilateral upper lobes and right middle lobe. Moderate to marked severity bilateral lower lobe compressive atelectasis is also seen. There are small bilateral pleural effusions. No pneumothorax is identified. Upper Abdomen: No acute abnormality. Musculoskeletal: A fracture deformity, and subsequent wedging, of the L1 vertebral body is seen. This involves predominantly the anterior aspect of the superior endplate and is of indeterminate age. Marked severity degenerative changes are seen involving both shoulders with multilevel degenerative changes noted throughout the thoracic spine. Review of the MIP images confirms the above findings. IMPRESSION: 1. No evidence of pulmonary embolism. 2. Small bilateral pleural effusions with moderate to marked severity bilateral lower lobe compressive atelectasis. 3. Mild, hazy bilateral upper lobe atelectasis and/or infiltrate with moderate severity areas of bilateral upper lobe and right middle lobe linear scarring and/or atelectasis. 4. Small pericardial effusion. 5. Fracture deformity of the L1 vertebral body of indeterminate age. 6. Aortic atherosclerosis. Aortic Atherosclerosis (ICD10-I70.0). Electronically Signed   By: Aram Candela M.D.   On: 12/12/2022 01:29     Pending Labs Unresulted Labs (From admission, onward)     Start     Ordered    0500  Creatinine, serum  (enoxaparin (LOVENOX)    CrCl >/= 30 ml/min)  Weekly,   R     Comments: while on enoxaparin therapy    12/12/22 0806   12/13/22 0500  CBC  Tomorrow morning,   R        12/12/22 0806   12/13/22 0500  Basic metabolic panel  Tomorrow morning,   R        12/12/22 0806   12/12/22 1000  CBC  Once,   R        12/12/22 1000   12/12/22 1000  Creatinine, serum  Once,   R        12/12/22 1000   12/12/22 0752  Strep pneumoniae urinary antigen  Once,   R        12/12/22 0751   12/12/22 0752  Legionella Pneumophila Serogp 1 Ur Ag  Once,   R        12/12/22 0751   12/12/22 0751  Expectorated Sputum Assessment w Gram Stain, Rflx to Resp Cult  Once,   R        12/12/22 0751   12/12/22 0643  Blood gas, arterial  Once,   R        12/12/22 0642   12/12/22 0512  Respiratory (~20 pathogens) panel by PCR  (Respiratory panel by PCR (~20 pathogens, ~24 hr TAT)  w precautions)  Once,   R        12/12/22 0511   12/12/22 0511  MRSA Next Gen by PCR, Nasal  Once,   R        12/12/22 0511            Vitals/Pain Today's Vitals   12/12/22 0845 12/12/22 0930 12/12/22 1000 12/12/22 1015  BP: (!) 148/63 (!) 145/68 (!) 143/84 133/60  Pulse: (!) 58 77 (!) 59 62  Resp: (!) 22 17 19  (!) 32  Temp:      TempSrc:      SpO2: 96% 91% 96% 96%  Weight:      Height:        Isolation Precautions Droplet precaution  Medications Medications  albuterol (PROVENTIL) (2.5 MG/3ML) 0.083% nebulizer solution 2.5 mg (has no administration in time range)  ceFEPIme (MAXIPIME) 2 g in sodium chloride 0.9 % 100 mL IVPB (0 g Intravenous Stopped 12/12/22 0906)  vancomycin (VANCOREADY) IVPB 750 mg/150 mL (has no administration in time range)  ipratropium-albuterol (DUONEB) 0.5-2.5 (3) MG/3ML nebulizer solution 3 mL (3 mLs Nebulization Given 12/12/22 0827)  enoxaparin (LOVENOX) injection 40 mg (has no  administration in time range)  sodium chloride flush (NS) 0.9 % injection 3 mL (3 mLs Intravenous Given 12/12/22 0912)  docusate sodium (COLACE) capsule 100 mg (has no administration in time range)  polyethylene glycol (MIRALAX / GLYCOLAX) packet 17 g (has no administration in time range)  lactated ringers infusion ( Intravenous New Bag/Given 12/12/22 0953)  ipratropium-albuterol (DUONEB) 0.5-2.5 (3) MG/3ML nebulizer solution 3 mL (3 mLs Nebulization Given 12/11/22 2323)  methylPREDNISolone sodium succinate (SOLU-MEDROL) 125 mg/2 mL injection 125 mg (125 mg Intravenous Given 12/11/22 2322)  furosemide (LASIX) injection 40 mg (40 mg Intravenous Given 12/11/22 2356)  iohexol (OMNIPAQUE) 350 MG/ML injection 70 mL (70 mLs Intravenous Contrast Given 12/12/22 0112)  cefTRIAXone (ROCEPHIN) 1 g in sodium chloride 0.9 % 100 mL IVPB (0 g Intravenous Stopped 12/12/22 0305)  azithromycin (ZITHROMAX) 500 mg in sodium chloride 0.9 % 250 mL IVPB (0 mg Intravenous Stopped 12/12/22 0617)  furosemide (LASIX) injection 80 mg (80 mg Intravenous Given 12/12/22 0606)  vancomycin (VANCOREADY) IVPB 2000 mg/400 mL (0 mg Intravenous Stopped 12/12/22 0824)  acetaZOLAMIDE (DIAMOX) injection 500 mg (500 mg Intravenous Given 12/12/22 0912)    Mobility walks with device     Focused Assessments Pulmonary Assessment Handoff:  Lung sounds: Bilateral Breath Sounds: Diminished, Fine crackles L Breath Sounds:  Fine crackles R Breath Sounds: Fine crackles O2 Device: Bi-PAP O2 Flow Rate (L/min): 10 L/min (pt placed on HFNC per MD)    R Recommendations: See Admitting Provider Note  Report given to:   Additional Notes:

## 2022-12-12 NOTE — Progress Notes (Signed)
PCCM progress note   On arrival to ICU full evaluation of skin completed and multiple wounds seen to right foot.  Majority appear pressure related but given depth and purulent drainage will obtain x-ray to assist in ruling out osteomyelitis.            Lovella Hardie D. Harris, NP-C [facility] Personal contact information can be found on Amion  If no contact or response made please call [phone] 12/12/2022, 11:49 AM

## 2022-12-12 NOTE — H&P (Addendum)
History and Physical    [name] ZOX:[id number] DOB: 12/05/1922 DOA: 20-Dec-2022  PCP: Eloisa Northern, MD   Patient coming from:  ALF    Chief Complaint:  Chief Complaint  Patient presents with   Shortness of Breath    HPI: History is limited due to patient's somnolence.  Provided entirely by her nephew at the bedside [name] is a 89 y.o. female with hx of chronic hypoxic respiratory failure on 2 L around-the-clock, PAD with dry gangrene involving the right foot, chronic osteomyelitis, chronic recent antibiotic use, bedbound after femur fracture, A-fib, not on anticoagulation, hypertension, hypothyroidism, who was brought in from her nursing home after having desaturation into the mid 80s on her home 2 L O2.  Per nephew reports that typically although being bedbound she is oriented, able to participate in some IADLs like managing her own finances, semi dependent on ADLs due to her functional status after previous periprosthetic femur fracture.  Over the past few days has had decline in her mental status, has been sleeping more, disoriented.  Otherwise no additional symptoms noted by family or communicated via nursing staff.  Today had the desaturation episode as described, and brought to the ED.   Review of Systems:  ROS complete and negative except as marked above   Allergies  Allergen Reactions   Morphine And Codeine Other (See Comments)    Other Reaction: mild Intolerance, Allergy   Sulfa Antibiotics     Other reaction(s): Other (See Comments) Other Reaction: mild Intolerance, Allergy   Meperidine Hcl Nausea Only   Nsaids     Other reaction(s): Other (See Comments) Bleeding ulcer   Tolmetin Other (See Comments)    Other reaction(s): Other (See Comments) Bleeding ulcer    Prior to Admission medications   Medication Sig Start Date End Date Taking? Authorizing Provider  acetaminophen (TYLENOL) 325 MG tablet Take 2 tablets (650 mg total) by mouth every 6 (six)  hours as needed for moderate pain. Patient taking differently: Take 650 mg by mouth in the morning, at noon, and at bedtime. 09/19/20   Arrien, York Ram, MD  albuterol (VENTOLIN HFA) 108 (90 Base) MCG/ACT inhaler Inhale 2 puffs into the lungs every 4 (four) hours as needed for wheezing or shortness of breath.    [provider]  Amino Acids-Protein Hydrolys (FEEDING SUPPLEMENT, PRO-STAT SUGAR FREE 64,) LIQD Take 30 mLs by mouth daily.    [provider]  calcium-vitamin D (OSCAL WITH D) 500-200 MG-UNIT per tablet Take 1 tablet by mouth daily.    [provider]  cyclobenzaprine (FLEXERIL) 5 MG tablet Take 5 mg by mouth at bedtime. 06/23/21   [provider]  Ensure (ENSURE) Take 237 mLs by mouth 2 (two) times daily.    [provider]  furosemide (LASIX) 40 MG tablet Take 40 mg by mouth daily. 06/23/21   [provider]  Guaifenesin 200 MG/5ML LIQD Take 10 mLs by mouth every 4 (four) hours as needed (Cough).    [provider]  ipratropium-albuterol (DUONEB) 0.5-2.5 (3) MG/3ML SOLN Take 3 mLs by nebulization every 6 (six) hours as needed (SHOB/Wheezing).    [provider]  levothyroxine (SYNTHROID) 50 MCG tablet Take 50 mcg by mouth at bedtime. 06/27/21   [provider]  Menthol, Topical Analgesic, (BIOFREEZE) 4 % GEL Apply 1 Application topically at bedtime. To BLE    [provider]  Misc. Devices (POSTURE SEAT) MISC Lift chair 01/19/20   Monica Becton, MD  Multiple Vitamins-Minerals (CENTRUM PO) Take 1 tablet by mouth daily.    [provider]  Omega-3 Fatty Acids (FISH OIL PO) Take 1 tablet by mouth daily.    [provider]  omeprazole (PRILOSEC) 20 MG capsule Take 1 capsule (20 mg total) by mouth every morning. 06/05/22   Zigmund Daniel., MD  oxyCODONE (OXY IR/ROXICODONE) 5 MG immediate release tablet Take 1 tablet (5 mg total) by mouth every 4 (four) hours as needed  for severe pain or moderate pain. Patient not taking: Reported on 05/29/2022 08/10/21   West Bali, PA-C  polyethylene glycol (MIRALAX) 17 g packet Take 17 g by mouth daily.    [provider]  potassium chloride (KLOR-CON M) 10 MEQ tablet Take 10 mEq by mouth daily. 06/23/21   [provider]  senna (SENOKOT) 8.6 MG TABS tablet Take 2 tablets by mouth at bedtime.    [provider]  SYMBICORT 160-4.5 MCG/ACT inhaler Inhale 2 puffs into the lungs 2 (two) times daily. 07/10/21   [provider]  zinc oxide 20 % ointment Apply 1 Application topically daily. Left upper posterior thigh    [provider]    Past Medical History:  Diagnosis Date   Atrial fibrillation (HCC)    CARCINOMA, BREAST    CHEST PAIN    DEGENERATIVE JOINT DISEASE    Diastolic heart failure (HCC) 08/03/2008   Qualifier: Diagnosis of  By: Kem Parkinson     Difficult intubation    Edema    GIB (gastrointestinal bleeding) 05/24/2015   Humerus fracture 09/12/2020   HYPERTENSION    Long term (current) use of anticoagulants    Malignant neoplasm of female breast (HCC) 08/03/2008   Qualifier: Diagnosis of  By: Kem Parkinson     Unspecified diastolic heart failure     Past Surgical History:  Procedure Laterality Date   BREAST LUMPECTOMY     ESOPHAGOGASTRODUODENOSCOPY (EGD) WITH PROPOFOL Left 05/28/2015   Procedure: ESOPHAGOGASTRODUODENOSCOPY (EGD) WITH PROPOFOL;  Surgeon: Charlott Rakes, MD;  Location: [facility];  Service: Endoscopy;  Laterality: Left;   HIP SURGERY     KNEE SURGERY     LAMINECTOMY     ORIF FEMUR FRACTURE Right 08/08/2021   Procedure: OPEN REDUCTION INTERNAL FIXATION (ORIF) DISTAL FEMUR FRACTURE;  Surgeon: Joen Laura, MD;  Location: [facility];  Service: Orthopedics;  Laterality: Right;   TONSILLECTOMY       reports that she has never smoked. She has never used smokeless tobacco. She reports that she does not drink alcohol and does not  use drugs.  Family History  Problem Relation Age of Onset   CAD Mother    Heart attack Mother    Hypertension Mother    CAD Father    Stroke Brother    Heart attack Brother      Physical Exam: Vitals:   12/12/22 0445 12/12/22 0500 12/12/22 0510 12/12/22 0515  BP: (!) 143/59 (!) 125/49  127/68  Pulse: (!) 58 (!) 57  (!) 58  Resp: 19 18  (!) 24  Temp:      TempSrc:      SpO2: 100% 99%  99%  Weight:   86.1 kg   Height:   5\' 8"  (1.727 m)     Gen: Somnolent, arouses to voice and falls back asleep easily.  She is elderly, frail, and chronically and acutely ill-appearing CV: Irregular, normal rate, normal S1, S2, no murmurs  Resp: Increased WOB, on BiPAP (unable to  remove mask 2/2 mental status).  Abd: Flat, normoactive, nontender MSK: There is generalized pitting anasarca  Skin: No rashes or lesions to exposed skin  Neuro: Somnolent, arouses to voice and falls back asleep easily. CN exam limited by mental status, PERRL, no obvious facial droop. Able to grip bilaterally. Does not move lower ext to command  Psych: Somnolent, unable to assess    Data review:   Labs reviewed, notable for:   ABG 7.3 1/66/74,  bicarb 28  K5.4, down to 4.7 on gas Creatinine 1 BNP 206 High-sensitivity troponin 40, down to 39 Lactate 1.1 WBC 18 UA not consistent with infection  Micro:  Results for orders placed or performed during the hospital encounter of 12/27/2022  SARS Coronavirus 2 by RT PCR (hospital order, performed in [facility] hospital lab) *cepheid single result test* Anterior Nasal Swab     Status: None   Collection Time: 12/20/2022 11:13 PM   Specimen: Anterior Nasal Swab  Result Value Ref Range Status   SARS Coronavirus 2 by RT PCR NEGATIVE NEGATIVE Final    Comment: Performed at [facility], [address], [place], [place] [address]    Imaging reviewed:  CT HEAD WO CONTRAST ( )  Result Date: 12/12/2022 CLINICAL DATA:  89 year old female with encephalopathy.  Shortness of breath. EXAM: CT HEAD WITHOUT CONTRAST TECHNIQUE: Contiguous axial images were obtained from the base of the skull through the vertex without intravenous contrast. RADIATION DOSE REDUCTION: This exam was performed according to the departmental dose-optimization program which includes automated exposure control, adjustment of the mA and/or kV according to patient size and/or use of iterative reconstruction technique. COMPARISON:  Head CT 08/06/2021. FINDINGS: Brain: Stable cerebral volume. No midline shift, ventriculomegaly, mass effect, evidence of mass lesion, intracranial hemorrhage or evidence of cortically based acute infarction. Patchy and confluent bilateral cerebral white matter hypodensity, deep white matter capsule involvement, some deep gray nuclei heterogeneity, chronic occipital pole encephalomalacia on the right are stable. No abnormal enhancement identified. Vascular: Residual intravascular contrast from CTA 0104 hours today. Calcified atherosclerosis at the skull base. Skull: No acute osseous abnormality identified. Sinuses/Orbits: Visualized paranasal sinuses and mastoids are clear. Other: No acute orbit or scalp soft tissue finding. IMPRESSION: 1. No acute intracranial abnormality identified. 2. Stable noncontrast CT appearance of chronic ischemic disease since last year. Electronically Signed   By: Odessa Fleming M.D.   On: 12/12/2022 07:15   DG Chest Portable 1 View  Result Date: 12/12/2022 CLINICAL DATA:  Shortness of breath. EXAM: PORTABLE CHEST 1 VIEW COMPARISON:  March 26, 2020 FINDINGS: The cardiac silhouette is mildly enlarged which may be, in part, secondary to low lung volumes. Mild, diffusely increased interstitial lung markings are noted. Moderate severity areas of atelectasis and/or infiltrate are seen within the right upper lobe and bilateral lung bases. Small bilateral pleural effusions are also present. No pneumothorax is identified. Radiopaque surgical clips are seen  along the right axilla and lateral right chest wall. Marked severity degenerative changes are seen involving both shoulders with multilevel degenerative changes noted throughout the thoracic spine. IMPRESSION: 1. Mild cardiomegaly with mild interstitial edema. 2. Moderate severity right upper lobe and bilateral basilar atelectasis and/or infiltrate. 3. Small bilateral pleural effusions. Electronically Signed   By: Aram Candela M.D.   On: 12/12/2022 01:33   CT Angio Chest PE W and/or Wo Contrast  Result Date: 12/12/2022 CLINICAL DATA:  Shortness of breath and difficulty breathing. EXAM: CT ANGIOGRAPHY CHEST WITH CONTRAST TECHNIQUE: Multidetector CT imaging of  the chest was performed using the standard protocol during bolus administration of intravenous contrast. Multiplanar CT image reconstructions and MIPs were obtained to evaluate the vascular anatomy. RADIATION DOSE REDUCTION: This exam was performed according to the departmental dose-optimization program which includes automated exposure control, adjustment of the mA and/or kV according to patient size and/or use of iterative reconstruction technique. CONTRAST:  70mL OMNIPAQUE IOHEXOL 350 MG/ML SOLN COMPARISON:  None Available. FINDINGS: Cardiovascular: There is mild to moderate severity calcification of the aortic arch and descending thoracic aorta, without evidence of aortic aneurysm. Satisfactory opacification of the pulmonary arteries to the segmental level. No evidence of pulmonary embolism. The heart is borderline in size, with a small (approximately 7 mm thick) pericardial effusion. Mediastinum/Nodes: There is mild pretracheal lymphadenopathy. Thyroid gland, trachea, and esophagus demonstrate no significant findings. Lungs/Pleura: Mild, hazy bilateral upper lobe atelectasis and/or infiltrate is seen with moderate severity areas of linear scarring and/or atelectasis noted within the bilateral upper lobes and right middle lobe. Moderate to marked  severity bilateral lower lobe compressive atelectasis is also seen. There are small bilateral pleural effusions. No pneumothorax is identified. Upper Abdomen: No acute abnormality. Musculoskeletal: A fracture deformity, and subsequent wedging, of the L1 vertebral body is seen. This involves predominantly the anterior aspect of the superior endplate and is of indeterminate age. Marked severity degenerative changes are seen involving both shoulders with multilevel degenerative changes noted throughout the thoracic spine. Review of the MIP images confirms the above findings. IMPRESSION: 1. No evidence of pulmonary embolism. 2. Small bilateral pleural effusions with moderate to marked severity bilateral lower lobe compressive atelectasis. 3. Mild, hazy bilateral upper lobe atelectasis and/or infiltrate with moderate severity areas of bilateral upper lobe and right middle lobe linear scarring and/or atelectasis. 4. Small pericardial effusion. 5. Fracture deformity of the L1 vertebral body of indeterminate age. 6. Aortic atherosclerosis. Aortic Atherosclerosis (ICD10-I70.0). Electronically Signed   By: Aram Candela M.D.   On: 12/12/2022 01:29    EKG:  A flutter with controlled ventricular rate, variable conduction, diffuse T wave inversions  ED Course:  Treated with methylprednisolone 125 mg IV, nebulizer, Lasix 40 mg IV, ceftriaxone, azithromycin.  She was placed on BiPAP due to increased work of breathing, and borderline sats on 6 L nasal cannula.  On my evaluation did not appear to be a good BiPAP candidate, unable to reach or take off mask, somnolent.  Initially transition back to mid flow nasal cannula.   After admission to [facility] , upon returning from CT head she had an acute desaturation into the mid to lower 80s.  She was placed back on rescue BiPAP, but again not a good candidate.  ICU was consulted and pending their evaluation.   Assessment/Plan:  89 y.o. female with hx chronic hypoxic respiratory  failure on 2 L around-the-clock, PAD with dry gangrene involving the right foot, chronic osteomyelitis, chronic recent antibiotic use, bedbound after femur fracture, A-fib, not on anticoagulation, hypertension, hypothyroidism, who was brought in from her nursing home for AHRF.   Acute on chronic hypoxic, hypercapnic respiratory failure Community acquired pneumonia with risk for MDRO  Acute exacerbation of diastolic heart failure  Baseline 2 L O2, Carries diagnosis of COPD, although no hx smoking, no emphysema reported on CT. Question underlying ILD. Had desat to mid 80's at nursing facility. Requiring rescue BiPAP with recurrent episodes of resp failure in the ED. ABG on BiPAP shows a partially compensated resp acidosis with low P/F. CT with hazy upper lobe infiltrates, and  mod-marked B/L lower lobe compression / atelectasis. Small pleural effusions. Underlying scarring in the UL and RML. Suspect acute component of resp failure combined due to CAP, Atelectasis / shunting, and pulmonary edema.  - Currently back on rescue BiPAP with close monitoring in ED. Do not feel she is a good BiPAP candidate due to her mental status and inability to take off mask.  - ICU has been consulted due to recurrent respiratory failure, pending their evaulation.  - s/p CTX, Azithromycin. Broadened to Vancomycin, Cefepime  - Check MRSA nares, RPP, resp culture  - S/p diuresis with Lasix 40 mg IV x 1 with minimal UOP, redosed 80 mg IV x 1  - Duoneb scheduled q 4 hr, albuterol q 4 prn, IS, flutter, Chest PT. Consider hypertonic saline nebs  - TTE to eval HF  - NPO  - GOC per below   Acute myocardial injury EKG with possible ischemic changes, diffuse T wave inversion.  High-sensitivity troponin downtrending 40 ->39, suspect demand related to her respiratory failure -Treatment targeted at respiratory failure per above  Goals of care  I had a frank discussion with her nephew Christiane Ha at the bedside who is POA, he tells me  POA is shared, but on review of chart previous note stating alternate listed as Deveron Furlong. I discussed that even outside the acute issues above that her likelihood of meaningful recovery after cardiac / respiratory arrest is low. And in the setting of her severe respiratory failure, CAP, volume overload, that she is at high risk of mortality during this hospitalization. I discussed that she is not a good bipap candidate but required this again for rescue. Given this, she is nearing point of intubation if that were her wishes or in her best interest. I made a recommendation for DNR/DNI. He is very understanding of all this but states previously she has expressed wanting full measures and he would like to continue Full code for now. He will talk more with Jan and we will continue to engage with them about goals of care.  - Continue GOC discussions, for now remains as full code.   Chronic medical problems: A-fib: Not on anticoagulation PAD with osteomyelitis, dry gangrene of the right foot: Wound care consult.  On antibiotics per above, previously on chronic oral antibiotics Bedbound status after femur fracture Hypothyroidism: Holding levothyroxine while n.p.o.   Body mass index is 28.86 kg/m.    DVT prophylaxis:  Lovenox Code Status:  Full Code; See GOC above  Diet:  Diet Orders (From admission, onward)    None      Family Communication:  Yes discussed with her nephew at the bedside Consults: ICU Admission status:   Inpatient, Step Down Unit  Severity of Illness: The appropriate patient status for this patient is INPATIENT. Inpatient status is judged to be reasonable and necessary in order to provide the required intensity of service to ensure the patient's safety. The patient's presenting symptoms, physical exam findings, and initial radiographic and laboratory data in the context of their chronic comorbidities is felt to place them at high risk for further clinical deterioration.  Furthermore, it is not anticipated that the patient will be medically stable for discharge from the hospital within 2 midnights of admission.   * I certify that at the point of admission it is my clinical judgment that the patient will require inpatient hospital care spanning beyond 2 midnights from the point of admission due to high intensity of service, high risk for further deterioration  and high frequency of surveillance required.*   Dolly Rias, MD [facility]  How to contact the [facility] Attending or Consulting provider 7A - 7P or covering provider during after hours 7P -7A, for this patient.  Check the care team in [facility] and look for a) attending/consulting [facility] provider listed and b) the [facility] team listed Log into [email or web address] and use [place]'s universal password to access. If you do not have the password, please contact the hospital operator. Locate the [facility] provider you are looking for under [facility] and page to a number that you can be directly reached. If you still have difficulty reaching the provider, please page the [facility] (Director on Call) for the Hospitalists listed on amion for assistance.  12/12/2022, 7:24 AM   CRITICAL CARE Performed by: Dolly Rias   Total critical care time: 35 minutes  Critical care time was exclusive of separately billable procedures and treating other patients.  Critical care was necessary to treat or prevent imminent or life-threatening deterioration. Including recurrent hypoxic and hypercapneic respiratory failure requiring rescue BiPAP   Critical care was time spent personally by me on the following activities: development of treatment plan with patient and/or surrogate as well as nursing, discussions with consultants, evaluation of patient's response to treatment, examination of patient, obtaining history from patient or surrogate, ordering and performing treatments and interventions, ordering and review of laboratory studies,  ordering and review of radiographic studies, pulse oximetry and re-evaluation of patient's condition.

## 2022-12-12 NOTE — Consult Note (Addendum)
NAME:  [name], MRN:  [id number], DOB:  [birth year], LOS: 0 ADMISSION DATE:  12/28/2022, CONSULTATION DATE: 12/12/2022 REFERRING MD: Dr. Valda Lamb, CHIEF COMPLAINT: Shortness of breath, hypoxemic respiratory failure  History of Present Illness:  89 year old with a history of chronic hypoxemic respiratory failure on 2 L of oxygen, came in with worsening shortness of breath, noted to be desaturating at a nursing home She is bedbound but usually awake and interactive History of peripheral arterial disease, dry gangrene involving the right foot, chronic osteomyelitis, chronic antibiotic use of recent Atrial fibrillation-not on anticoagulation, hypertension, hypothyroidism Declining mental status over the last couple of days, some disorientation Has not been coughing more  Pertinent  Medical History   Past Medical History:  Diagnosis Date   Atrial fibrillation (HCC)    CARCINOMA, BREAST    CHEST PAIN    DEGENERATIVE JOINT DISEASE    Diastolic heart failure (HCC) 08/03/2008   Qualifier: Diagnosis of  By: Kem Parkinson     Difficult intubation    Edema    GIB (gastrointestinal bleeding) 05/24/2015   Humerus fracture 09/12/2020   HYPERTENSION    Long term (current) use of anticoagulants    Malignant neoplasm of female breast (HCC) 08/03/2008   Qualifier: Diagnosis of  By: Kem Parkinson     Unspecified diastolic heart failure     Significant Hospital Events: Including procedures, antibiotic start and stop dates in addition to other pertinent events   12/11/2020 CT chest IMPRESSION: 1. No evidence of pulmonary embolism. 2. Small bilateral pleural effusions with moderate to marked severity bilateral lower lobe compressive atelectasis. 3. Mild, hazy bilateral upper lobe atelectasis and/or infiltrate with moderate severity areas of bilateral upper lobe and right middle lobe linear scarring and/or atelectasis. 4. Small pericardial effusion. 5. Fracture deformity of the L1  vertebral body of indeterminate age. 6. Aortic atherosclerosis. 12/11/2020 CT head impression: No acute abnormality   Interim History / Subjective:  Elderly, does not appear to be in distress, on BiPAP at present Not able to interact, nephew at bedside who reports that she was looking better when she was on a high flow but appears to have struggled following being moved around for the scans  Objective   Blood pressure (!) 160/68, pulse (!) 57, temperature 98.4 F (36.9 C), temperature source Axillary, resp. rate 17, height 5\' 8"  (1.727 m), weight 86.1 kg, SpO2 96%.    Vent Mode: PCV;BIPAP FiO2 (%):  [40 %] 40 % Set Rate:  [15 bmp] 15 bmp PEEP:  [5 cmH20-6 cmH20] 5 cmH20 Pressure Support:  [12 cmH20] 12 cmH20  No intake or output data in the 24 hours ending 12/12/22 0809 Filed Weights   12/12/22 0510  Weight: 86.1 kg    Examination: General: Elderly, does not appear to be in distress, BiPAP mask in place HENT: Dry oral mucosa Lungs: Decreased air movement bilaterally with rales at the bases Cardiovascular: S1-S2 appreciated, irregularly irregular pulse Abdomen: Soft, bowel sounds appreciated Extremities: No clubbing Neuro: Not following commands, responsive, moving extremities GU:   I reviewed nursing notes, hospitalist notes, last 24 h vitals and pain scores, last 48 h intake and output, last 24 h labs and trends, and last 24 h imaging results.  Resolved Hospital Problem list     Assessment & Plan:  Acute hypoxemic respiratory failure Acute on chronic hypoxemic/hypercapnic respiratory failure -Currently on BiPAP -ABG pending -Dose of Diamox added  Multifocal pneumonia Leukocytosis Bilateral infiltrates on CT scan of the chest -On antibiotics-vancomycin, cefepime -  Follow cultures -Follow pneumococcal and streptococcal antigen -Follow MRSA PCR and de-escalate  Atrial fibrillation -Rate currently controlled -Not on anticoagulation  Underlying history of chronic  obstructive pulmonary disease -Bronchodilators-DuoNeb  Demand ischemia -Continue to monitor -Telemetry monitoring  Peripheral arterial disease, osteomyelitis, dry gangrene of the right foot -Wound care consultation  Hypothyroidism -Will plan to resume Synthroid when she can take in orally  Goals of care discussions with nephew at bedside Family member who has power of attorney will be coming in at some point  Past history of breast cancer  History of diastolic heart failure Hypertension -Usually on Lasix 40 mg daily. -Hold home antihypertensives  Best Practice (right click and "Reselect all SmartList Selections" daily)   Diet/type: NPO DVT prophylaxis: LMWH GI prophylaxis: N/A Lines: N/A Foley:  N/A Code Status:  full code Last date of multidisciplinary goals of care discussion [will continue to discuss with family]  Labs   CBC: Recent Labs  Lab 12/18/2022 2313 12/12/22 0212 12/12/22 0625 12/12/22 0703  WBC 18.7*  --  13.3*  --   NEUTROABS 16.5*  --   --   --   HGB 11.3* 11.9* 11.1* 11.6*  HCT 37.3 35.0* 36.1 34.0*  MCV 95.4  --  96.5  --   PLT 382  --  372  --     Basic Metabolic Panel: Recent Labs  Lab 12/18/2022 2313 12/12/22 0212 12/12/22 0625 12/12/22 0703  NA 143 139 140 139  K 5.4* 4.7 4.7 4.7  CL 100  --  100  --   CO2 28  --  29  --   GLUCOSE 131*  --  168*  --   BUN 44*  --  41*  --   CREATININE 1.09*  --  1.23*  --   CALCIUM 9.3  --  8.8*  --   MG  --   --  2.3  --   PHOS  --   --  4.5  --    GFR: Estimated Creatinine Clearance: 30 mL/min (A) (by C-G formula based on SCr of 1.23 mg/dL (H)). Recent Labs  Lab 12/18/2022 2313 12/12/22 0143 12/12/22 0625  WBC 18.7*  --  13.3*  LATICACIDVEN  --  1.1  --     Liver Function Tests: Recent Labs  Lab 12/18/2022 2313  AST 26  ALT 30  ALKPHOS 141*  BILITOT 0.8  PROT 7.1  ALBUMIN 2.7*   No results for input(s): "LIPASE", "AMYLASE" in the last 168 hours. No results for input(s): "AMMONIA"  in the last 168 hours.  ABG    Component Value Date/Time   PHART 7.331 (L) 12/12/2022 0703   PCO2ART 63.3 (H) 12/12/2022 0703   PO2ART 74 (L) 12/12/2022 0703   HCO3 33.5 (H) 12/12/2022 0703   TCO2 35 (H) 12/12/2022 0703   O2SAT 93 12/12/2022 0703     Coagulation Profile: No results for input(s): "INR", "PROTIME" in the last 168 hours.  Cardiac Enzymes: No results for input(s): "CKTOTAL", "CKMB", "CKMBINDEX", "TROPONINI" in the last 168 hours.  HbA1C: Hgb A1c MFr Bld  Date/Time Value Ref Range Status  05/25/2015 05:34 AM 5.9 (H) 4.8 - 5.6 % Final    Comment:    (NOTE)         Pre-diabetes: 5.7 - 6.4         Diabetes: >6.4         Glycemic control for adults with diabetes: <7.0     CBG: No results for input(s): "GLUCAP"  in the last 168 hours.  Review of Systems:   On BiPAP at present Unable to give a history  Past Medical History:  She,  has a past medical history of Atrial fibrillation (HCC), CARCINOMA, BREAST, CHEST PAIN, DEGENERATIVE JOINT DISEASE, Diastolic heart failure (HCC) (95/62/1308), Difficult intubation, Edema, GIB (gastrointestinal bleeding) (05/24/2015), Humerus fracture (09/12/2020), HYPERTENSION, Long term (current) use of anticoagulants, Malignant neoplasm of female breast (HCC) (08/03/2008), and Unspecified diastolic heart failure.   Surgical History:   Past Surgical History:  Procedure Laterality Date   BREAST LUMPECTOMY     ESOPHAGOGASTRODUODENOSCOPY (EGD) WITH PROPOFOL Left 05/28/2015   Procedure: ESOPHAGOGASTRODUODENOSCOPY (EGD) WITH PROPOFOL;  Surgeon: Charlott Rakes, MD;  Location: [facility] ENDOSCOPY;  Service: Endoscopy;  Laterality: Left;   HIP SURGERY     KNEE SURGERY     LAMINECTOMY     ORIF FEMUR FRACTURE Right 08/08/2021   Procedure: OPEN REDUCTION INTERNAL FIXATION (ORIF) DISTAL FEMUR FRACTURE;  Surgeon: Joen Laura, MD;  Location: [facility] ORS;  Service: Orthopedics;  Laterality: Right;   TONSILLECTOMY       Social History:   reports  that she has never smoked. She has never used smokeless tobacco. She reports that she does not drink alcohol and does not use drugs.   Family History:  Her family history includes CAD in her father and mother; Heart attack in her brother and mother; Hypertension in her mother; Stroke in her brother.   Allergies Allergies  Allergen Reactions   Morphine And Codeine Other (See Comments)    Other Reaction: mild Intolerance, Allergy   Sulfa Antibiotics     Other reaction(s): Other (See Comments) Other Reaction: mild Intolerance, Allergy   Meperidine Hcl Nausea Only   Nsaids     Other reaction(s): Other (See Comments) Bleeding ulcer   Tolmetin Other (See Comments)    Other reaction(s): Other (See Comments) Bleeding ulcer     The patient is critically ill with multiple organ systems failure and requires high complexity decision making for assessment and support, frequent evaluation and titration of therapies, application of advanced monitoring technologies and extensive interpretation of multiple databases. Critical Care Time devoted to patient care services described in this note independent of APP/resident time (if applicable)  is 35 minutes.   Virl Diamond MD [facility] Personal pager: See Amion If unanswered, please page CCM On-call: #[phone]

## 2022-12-12 NOTE — ED Notes (Signed)
RT called and Dr Manus Gunning notified that the patient returned from CT and her PsO2 is 86-88% upon returning, new orders to be entered and patient placed back on Bipap

## 2022-12-12 NOTE — Hospital Course (Signed)
Taken from H&P.  [name] is a 89 y.o. female with hx of chronic hypoxic respiratory failure on 2 L around-the-clock, PAD with dry gangrene involving the right foot, chronic osteomyelitis, chronic recent antibiotic use, bedbound after femur fracture, A-fib, not on anticoagulation, hypertension, hypothyroidism, who was brought in from her nursing home after having desaturation into the mid 80s on her home 2 L O2.  Per nephew reports that typically although being bedbound she is oriented, able to participate in some IADLs like managing her own finances, semi dependent on ADLs due to her functional status after previous periprosthetic femur fracture.  Over the past few days has had decline in her mental status, has been sleeping more, disoriented.   Patient was found to be in respiratory distress with hypoxia and hypercapnia, initially placed on high flow later transition to BiPAP due to increased work of breathing.  Not a good candidate for BiPAP.  Patient was afebrile, tachypneic and hypoxic on arrival.  Labs pertinent for leukocytosis at 18.7, absolute neutrophils 16.5, hemoglobin 11.3 which is around her baseline, potassium 5.4, BUN 44, creatinine 1.09, alkaline phosphatase 141, BNP 206, COVID PCR negative, troponin 40>39, lactic acid normal, UA negative for UTI.  Chest x-ray with mild cardiomegaly with some interstitial edema, moderate severity right upper lobe and bilateral bases bibasilar atelectasis versus infiltrate. CTA chest was negative for PE, did show small bilateral pleural effusion with bilateral lower lobe compression atelectasis.  Mild hazy bilateral upper lobe atelectasis or infiltrate.  Small pericardial effusion.  Also noted an age-indeterminate L1 vertebral body fracture. CT head was negative for any acute abnormality.  Patient received IV Lasix and started on cefepime and vancomycin for concern of pneumonia with some resistant organism as she was chronically on  antibiotic.

## 2022-12-12 NOTE — Progress Notes (Signed)
Pharmacy Antibiotic Note  [name] is a 89 y.o. female admitted on 12/11/2022 with  osteomyelitis .  Pharmacy has been consulted for vancomycin dosing.  WBC 18 to 11, Scr 0.9 (CrCl 40 mL/min). LA 1.1, PCT 0.49. Afebrile. Xray large posterior calcaneal soft tissue wound with overt bony destruction of the underlying calcaneal process, consistent with osteomyelitis. Received 2g IV vancomycin on 10/12@0611 .   Plan: Vancomycin 1g IV every 24 hours (estAUC 470, Vd 0.72) - time from loading dose Monitor renal fx, cx results, clinical pic, and vanc levels prn  Height: 5\' 8"  (172.7 cm) Weight: 86.1 kg (189 lb 13.1 oz) IBW/kg (Calculated) : 63.9  Temp (24hrs), Avg:98 F (36.7 C), Min:97.4 F (36.3 C), Max:98.4 F (36.9 C)  Recent Labs  Lab 12/11/22 2313 12/12/22 0143 12/12/22 0625 12/12/22 1247  WBC 18.7*  --  13.3* 11.3*  CREATININE 1.09*  --  1.23* 0.92  LATICACIDVEN  --  1.1  --   --     Estimated Creatinine Clearance: 40.2 mL/min (by C-G formula based on SCr of 0.92 mg/dL).    Allergies  Allergen Reactions   Morphine And Codeine Other (See Comments)    Unknown reaction   Sulfa Antibiotics Other (See Comments)    Unknown reaction   Demerol [Meperidine Hcl] Nausea And Vomiting   Nsaids Other (See Comments)    Bleeding ulcer   Tolectin [Tolmetin] Other (See Comments)    Bleeding ulcer    Antimicrobials this admission: Azithromycin/ceftriaxone 10/12  Vancomycin 10/12 >>  Cefepime 10/12 >>   Dose adjustments this admission: N/A  Microbiology results: 10/12 BCx: ngtd 10/12 Resp PCR: neg   10/12 MRSA PCR: neg   Thank you for allowing pharmacy to participate in this patient's care,  Sherron Monday, PharmD, BCCCP Clinical Pharmacist  Phone: [phone] 12/12/2022 6:38 PM  Please check AMION for all [facility] phone numbers After 10:00 PM, call Main Pharmacy [phone]

## 2022-12-12 NOTE — Progress Notes (Signed)
eLink Physician-Brief Progress Note Patient Name: [name] DOB: May 18, 1922 MRN: [id number]   Date of Service  12/12/2022  HPI/Events of Note  Patient reportedly passed bedside swallow study today.  She remains NPO.  RN wants to know if she can give her diet.  eICU Interventions  Since it is already almost 10:30 PM, patient will not really be getting a diet at this time of the night.  Will wait for patient to get a formal swallow study in the morning and then advance her diet as tolerated. Discussed with bedside nurse.     Intervention Category Evaluation Type: Other  [name] 12/12/2022, 10:26 PM  Addendum: Bedside RN reports noticing scattered wheezing and crackles in patient's lungs.  She has LR running at 75 cc/h for 24 hours which was initiated by the intensivist from yesterday.  She wants to know if the LR should be continued.  Oxygen requirements are the same.  There is no increase in work of breathing.  Will continue LR and complete order placed by daytime physician. Bedside RN informed.  [name] 12/13/2022, 4:08 AM.

## 2022-12-12 NOTE — ED Notes (Signed)
1st lac in normal range, 2nd not needed can be discontinued unless Dr says otherwise

## 2022-12-12 NOTE — Progress Notes (Signed)
Pharmacy Antibiotic Note  [name] is a 89 y.o. female present on 12/11/2022 with respiratory failure and concern for pneumonia. Initially given ceftriaxone and azithromycin in ED. Antibiotics broadened to cefepime and vancomycin. Pharmacy has been consulted for vancomycin dosing.  Plan: Vancomycin 2g x 1 load followed by vancomycin 750mg  q24h (eAUC 415, Scr 1.09) F/u renal function, MRSA nares, and micro data to narrow as able Vancomycin levels as needed  Height: 5\' 8"  (172.7 cm) Weight: 86.1 kg (189 lb 13.1 oz) IBW/kg (Calculated) : 63.9  Temp (24hrs), Avg:98.4 F (36.9 C), Min:98.4 F (36.9 C), Max:98.4 F (36.9 C)  Recent Labs  Lab 12/11/22 2313 12/12/22 0143  WBC 18.7*  --   CREATININE 1.09*  --   LATICACIDVEN  --  1.1    Estimated Creatinine Clearance: 33.9 mL/min (A) (by C-G formula based on SCr of 1.09 mg/dL (H)).    Allergies  Allergen Reactions   Morphine And Codeine Other (See Comments)    Other Reaction: mild Intolerance, Allergy   Sulfa Antibiotics     Other reaction(s): Other (See Comments) Other Reaction: mild Intolerance, Allergy   Meperidine Hcl Nausea Only   Nsaids     Other reaction(s): Other (See Comments) Bleeding ulcer   Tolmetin Other (See Comments)    Other reaction(s): Other (See Comments) Bleeding ulcer    Antimicrobials this admission: Cefepime 10/12 > Vancomycin 10/12 > Ceftriaxone 10/12 x 1 Azithromycin 10/12 x 1  Microbiology results: 10/12 BCx: pending 10/12 resp panel: pending 10/12 MRSA nares: pending 10/12 COVID: negative  Thank you for allowing pharmacy to be a part of this patient's care.  Marja Kays 12/12/2022 5:34 AM

## 2022-12-12 NOTE — Progress Notes (Signed)
PCCM progress note  X-ray of foot consistent with large posterior calcaneal soft tissue wound with overt bony destruction of the underlying calcaneal process, consistent with osteomyelitis.  At this time we will add IV vancomycin to treatment regimen and plan for ID consult in AM.  Once medically stable can consider obtaining MRI of foot.  Makynzi Eastland D. Harris, NP-C [facility] Personal contact information can be found on Amion  If no contact or response made please call [phone] 12/12/2022, 5:58 PM

## 2022-12-13 DIAGNOSIS — J9601 Acute respiratory failure with hypoxia: Secondary | ICD-10-CM | POA: Diagnosis not present

## 2022-12-13 LAB — GLUCOSE, CAPILLARY
Glucose-Capillary: 105 mg/dL — ABNORMAL HIGH (ref 70–99)
Glucose-Capillary: 118 mg/dL — ABNORMAL HIGH (ref 70–99)
Glucose-Capillary: 80 mg/dL (ref 70–99)

## 2022-12-13 LAB — CBC
HCT: 31.4 % — ABNORMAL LOW (ref 36.0–46.0)
Hemoglobin: 9.6 g/dL — ABNORMAL LOW (ref 12.0–15.0)
MCH: 29.3 pg (ref 26.0–34.0)
MCHC: 30.6 g/dL (ref 30.0–36.0)
MCV: 95.7 fL (ref 80.0–100.0)
Platelets: 321 10*3/uL (ref 150–400)
RBC: 3.28 MIL/uL — ABNORMAL LOW (ref 3.87–5.11)
RDW: 14.6 % (ref 11.5–15.5)
WBC: 10.9 10*3/uL — ABNORMAL HIGH (ref 4.0–10.5)
nRBC: 0 % (ref 0.0–0.2)

## 2022-12-13 LAB — BASIC METABOLIC PANEL
Anion gap: 10 (ref 5–15)
BUN: 39 mg/dL — ABNORMAL HIGH (ref 8–23)
CO2: 30 mmol/L (ref 22–32)
Calcium: 8.4 mg/dL — ABNORMAL LOW (ref 8.9–10.3)
Chloride: 103 mmol/L (ref 98–111)
Creatinine, Ser: 0.61 mg/dL (ref 0.44–1.00)
GFR, Estimated: >60 mL/min (ref >60)
Glucose, Bld: 96 mg/dL (ref 70–99)
Potassium: 3.6 mmol/L (ref 3.5–5.1)
Sodium: 143 mmol/L (ref 135–145)

## 2022-12-13 MED ORDER — IPRATROPIUM-ALBUTEROL 0.5-2.5 (3) MG/3ML IN SOLN
3.0000 mL | Freq: Four times a day (QID) | RESPIRATORY_TRACT | Status: DC
  Administered 2022-12-13 – 2022-12-15 (×9): 3 mL via RESPIRATORY_TRACT
  Filled 2022-12-13 (×7): qty 3

## 2022-12-13 MED ORDER — LEVOTHYROXINE SODIUM 50 MCG PO TABS
50.0000 ug | ORAL_TABLET | Freq: Every day | ORAL | Status: DC
  Administered 2022-12-13 – 2022-12-16 (×3): 50 ug via ORAL
  Filled 2022-12-13 (×4): qty 1

## 2022-12-13 MED ORDER — FLUTICASONE FUROATE-VILANTEROL 200-25 MCG/ACT IN AEPB
1.0000 | INHALATION_SPRAY | Freq: Every day | RESPIRATORY_TRACT | Status: DC
  Administered 2022-12-13 – 2022-12-15 (×2): 1 via RESPIRATORY_TRACT
  Filled 2022-12-13 (×2): qty 28

## 2022-12-13 MED ORDER — FUROSEMIDE 40 MG PO TABS
40.0000 mg | ORAL_TABLET | Freq: Every day | ORAL | Status: DC
  Administered 2022-12-13 – 2022-12-14 (×2): 40 mg via ORAL
  Filled 2022-12-13 (×2): qty 1

## 2022-12-13 NOTE — Consult Note (Incomplete)
WOC Nurse Consult Note: Reason for Consult: right foot wounds Wound type: Pressure Injury POA: Yes/No/NA Measurement: Wound bed: Drainage (amount, consistency, odor)  Periwound: Dressing procedure/placement/frequency: Conservative sharp wound debridement (CSWD performed at the bedside):

## 2022-12-13 NOTE — Progress Notes (Signed)
NAME:  [name], MRN:  [id number], DOB:  06/02/1922, LOS: 1 ADMISSION DATE:  12/18/2022, CONSULTATION DATE:  12/12/2022 REFERRING MD:  Dr segers, CHIEF COMPLAINT: Shortness of breath, hypoxemic respiratory failure  History of Present Illness:  89 year old with a history of chronic hypoxemic respiratory failure on 2 L of oxygen, came in with worsening shortness of breath, noted to be desaturating at a nursing home She is bedbound but usually awake and interactive History of peripheral arterial disease, dry gangrene involving the right foot, chronic osteomyelitis, chronic antibiotic use of recent Atrial fibrillation-not on anticoagulation, hypertension, hypothyroidism Declining mental status over the last couple of days, some disorientation Has not been coughing more  Pertinent  Medical History   Past Medical History:  Diagnosis Date   Atrial fibrillation (HCC)    CARCINOMA, BREAST    CHEST PAIN    DEGENERATIVE JOINT DISEASE    Diastolic heart failure (HCC) 08/03/2008   Qualifier: Diagnosis of  By: Kem Parkinson     Difficult intubation    Edema    GIB (gastrointestinal bleeding) 05/24/2015   Humerus fracture 09/12/2020   HYPERTENSION    Long term (current) use of anticoagulants    Malignant neoplasm of female breast (HCC) 08/03/2008   Qualifier: Diagnosis of  By: Kem Parkinson     Unspecified diastolic heart failure      Significant Hospital Events: Including procedures, antibiotic start and stop dates in addition to other pertinent events   12/11/2020 CT chest IMPRESSION: 1. No evidence of pulmonary embolism. 2. Small bilateral pleural effusions with moderate to marked severity bilateral lower lobe compressive atelectasis. 3. Mild, hazy bilateral upper lobe atelectasis and/or infiltrate with moderate severity areas of bilateral upper lobe and right middle lobe linear scarring and/or atelectasis. 4. Small pericardial effusion. 5. Fracture deformity of the L1  vertebral body of indeterminate age. 6. Aortic atherosclerosis. 12/11/2020 CT head impression: No acute abnormality  10/12-x-ray foot consistent with a calcaneal process consistent with osteomyelitis  Interim History / Subjective:  More awake and interactive Denies any pain or discomfort at present  Objective   Blood pressure (!) 162/61, pulse 62, temperature 97.8 F (36.6 C), temperature source Oral, resp. rate 20, height 5\' 8"  (1.727 m), weight 86.1 kg, SpO2 93%.    Vent Mode: PCV;BIPAP FiO2 (%):  [40 %] 40 % Set Rate:  [8 bmp-15 bmp] 8 bmp PEEP:  [5 cmH20-6 cmH20] 6 cmH20 Pressure Support:  [12 cmH20] 12 cmH20   Intake/Output Summary (Last 24 hours) at 12/13/2022 0737 Last data filed at 12/13/2022 4098 Gross per 24 hour  Intake 2379.47 ml  Output 1050 ml  Net 1329.47 ml   Filed Weights   12/12/22 0510  Weight: 86.1 kg    Examination: General: Elderly, does not appear to be in distress HENT: Moist oral mucosa Lungs: Decreased air movement bilaterally with rales at the bases Cardiovascular: S1-S2 appreciated, irregularly irregular pulse Abdomen: Soft, bowel sounds appreciated Extremities: No clubbing, right foot dressing in place Neuro: Awake alert interactive GU:   Resolved Hospital Problem list     Assessment & Plan:  Acute hypoxemic respiratory failure Acute on chronic hypoxemic/hypercapnic respiratory failure -On oxygen supplementation at 3 L at present, usually on 2 L -Continue oxygen supplementation  Multilobar pneumonia Bilateral infiltrates on CT scan of the chest -On cefepime, vancomycin -Continue to follow cultures -Follow pneumococcal and streptococcal antigen -MRSA PCR negative however, continuing vancomycin  Atrial fibrillation-rate controlled Not on anticoagulation  Underlying history of chronic obstructive pulmonary  disease -Continue bronchodilators -DuoNeb  Demand ischemia -Continue telemetry monitoring  Peripheral arterial  disease Osteomyelitis Dry gangrene of the right foot -X-ray does show involvement of the bone -Will consult infectious disease -Continue vancomycin -Continue to monitor cultures -Wound consult  Hypothyroidism -Continue Synthroid-50 daily  History of diastolic heart failure -Resume Lasix 40 mg daily    Best Practice (right click and "Reselect all SmartList Selections" daily)   Diet/type: Regular consistency (see orders) DVT prophylaxis: LMWH GI prophylaxis: None Lines: N/A Foley:  N/A Code Status:  full code Last date of multidisciplinary goals of care discussion [discussed with family at bedside]  Labs   CBC: Recent Labs  Lab 12/14/2022 2313 12/12/22 0212 12/12/22 0625 12/12/22 0703 12/12/22 1247 12/13/22 0304  WBC 18.7*  --  13.3*  --  11.3* 10.9*  NEUTROABS 16.5*  --   --   --   --   --   HGB 11.3* 11.9* 11.1* 11.6* 10.8* 9.6*  HCT 37.3 35.0* 36.1 34.0* 35.1* 31.4*  MCV 95.4  --  96.5  --  97.0 95.7  PLT 382  --  372  --  345 321    Basic Metabolic Panel: Recent Labs  Lab 12/13/2022 2313 12/12/22 0212 12/12/22 0625 12/12/22 0703 12/12/22 1247 12/13/22 0304  NA 143 139 140 139  --  143  K 5.4* 4.7 4.7 4.7  --  3.6  CL 100  --  100  --   --  103  CO2 28  --  29  --   --  30  GLUCOSE 131*  --  168*  --   --  96  BUN 44*  --  41*  --   --  39*  CREATININE 1.09*  --  1.23*  --  0.92 0.61  CALCIUM 9.3  --  8.8*  --   --  8.4*  MG  --   --  2.3  --   --   --   PHOS  --   --  4.5  --   --   --    GFR: Estimated Creatinine Clearance: 46.2 mL/min (by C-G formula based on SCr of 0.61 mg/dL). Recent Labs  Lab 12/27/2022 2313 12/12/22 0143 12/12/22 0625 12/12/22 1247 12/13/22 0304  PROCALCITON  --  0.49  --   --   --   WBC 18.7*  --  13.3* 11.3* 10.9*  LATICACIDVEN  --  1.1  --   --   --     Liver Function Tests: Recent Labs  Lab 12/20/2022 2313  AST 26  ALT 30  ALKPHOS 141*  BILITOT 0.8  PROT 7.1  ALBUMIN 2.7*   No results for input(s):  "LIPASE", "AMYLASE" in the last 168 hours. No results for input(s): "AMMONIA" in the last 168 hours.  ABG    Component Value Date/Time   PHART 7.331 (L) 12/12/2022 0703   PCO2ART 63.3 (H) 12/12/2022 0703   PO2ART 74 (L) 12/12/2022 0703   HCO3 33.5 (H) 12/12/2022 0703   TCO2 35 (H) 12/12/2022 0703   O2SAT 93 12/12/2022 0703     Coagulation Profile: No results for input(s): "INR", "PROTIME" in the last 168 hours.  Cardiac Enzymes: No results for input(s): "CKTOTAL", "CKMB", "CKMBINDEX", "TROPONINI" in the last 168 hours.  HbA1C: Hgb A1c MFr Bld  Date/Time Value Ref Range Status  05/25/2015 05:34 AM 5.9 (H) 4.8 - 5.6 % Final    Comment:    (NOTE)  Pre-diabetes: 5.7 - 6.4         Diabetes: >6.4         Glycemic control for adults with diabetes: <7.0     CBG: Recent Labs  Lab 12/12/22 1158 12/12/22 2359 12/13/22 0618  GLUCAP 138* 105* 80    Review of Systems:   Awake alert and interactive this morning Denies any pain or discomfort  Past Medical History:  She,  has a past medical history of Atrial fibrillation (HCC), CARCINOMA, BREAST, CHEST PAIN, DEGENERATIVE JOINT DISEASE, Diastolic heart failure (HCC) (41/32/4401), Difficult intubation, Edema, GIB (gastrointestinal bleeding) (05/24/2015), Humerus fracture (09/12/2020), HYPERTENSION, Long term (current) use of anticoagulants, Malignant neoplasm of female breast (HCC) (08/03/2008), and Unspecified diastolic heart failure.   Surgical History:   Past Surgical History:  Procedure Laterality Date   BREAST LUMPECTOMY     ESOPHAGOGASTRODUODENOSCOPY (EGD) WITH PROPOFOL Left 05/28/2015   Procedure: ESOPHAGOGASTRODUODENOSCOPY (EGD) WITH PROPOFOL;  Surgeon: Charlott Rakes, MD;  Location: [facility] ENDOSCOPY;  Service: Endoscopy;  Laterality: Left;   HIP SURGERY     KNEE SURGERY     LAMINECTOMY     ORIF FEMUR FRACTURE Right 08/08/2021   Procedure: OPEN REDUCTION INTERNAL FIXATION (ORIF) DISTAL FEMUR FRACTURE;  Surgeon:  Joen Laura, MD;  Location: [facility] ORS;  Service: Orthopedics;  Laterality: Right;   TONSILLECTOMY       Social History:   reports that she has never smoked. She has never used smokeless tobacco. She reports that she does not drink alcohol and does not use drugs.   Family History:  Her family history includes CAD in her father and mother; Heart attack in her brother and mother; Hypertension in her mother; Stroke in her brother.   Allergies Allergies  Allergen Reactions   Morphine And Codeine Other (See Comments)    Unknown reaction   Sulfa Antibiotics Other (See Comments)    Unknown reaction   Demerol [Meperidine Hcl] Nausea And Vomiting   Nsaids Other (See Comments)    Bleeding ulcer   Tolectin [Tolmetin] Other (See Comments)    Bleeding ulcer    The patient is critically ill with multiple organ systems failure and requires high complexity decision making for assessment and support, frequent evaluation and titration of therapies, application of advanced monitoring technologies and extensive interpretation of multiple databases. Critical Care Time devoted to patient care services described in this note independent of APP/resident time (if applicable)  is 31 minutes.   Virl Diamond MD [facility] Personal pager: See Amion If unanswered, please page CCM On-call: #[phone]

## 2022-12-13 NOTE — Consult Note (Signed)
WOC Nurse Consult Note: Reason for Consult: gangrenous ulcerations of the right foot. We have seen patient for same this year. She has been seen by orthopedics and vascular, needs an amputation. Limb is not salvageable at this point. Xray + osteomyelitis  Wound type: Unstageable pressure injury: right heel in the presence of PAD Arterial ulcerations of the dorsal foot and malleolar lateral region  Pressure Injury POA: Yes Measurement:see nursing flow sheets Wound bed:all wounds are 100% non viable, necrotic tissue Drainage (amount, consistency, odor) see nursing flow sheet Periwound: intact  Dressing procedure/placement/frequency: Paint all right foot wounds with betadine, allow to air dry. Cover dorsal foot and malleolar wounds with xeroform gauze, top with ABD pads, secure with kerlix and tape. Change daily Re-consult orthopedics and/or vascular if aggressive care desired.    Re consult if needed, will not follow at this time. Thanks  Jacci Ruberg [employer], RN,CWOCN, CNS, CWON-AP [phone])

## 2022-12-13 NOTE — Progress Notes (Signed)
Patient brought to 4E from 51M. VSS. Telemetry applied, CCMD notified. Patient oriented to room and staff. Call bell in reach.  Kenard Gower, RN

## 2022-12-13 NOTE — Consult Note (Signed)
[facility]       Reason for Consult: gangrene    Referring Physician: Dr. Wynona Neat  Principal Problem:   Acute hypoxic respiratory failure (HCC) Active Problems:   Acute respiratory failure with hypoxemia (HCC)    Chlorhexidine Gluconate Cloth  6 each Topical Daily   enoxaparin (LOVENOX) injection  40 mg Subcutaneous Q24H   fluticasone furoate-vilanterol  1 puff Inhalation Daily   furosemide  40 mg Oral Daily   ipratropium-albuterol  3 mL Nebulization Q6H   levothyroxine  50 mcg Oral Q0600   mouth rinse  15 mL Mouth Rinse 4 times per day   sodium chloride flush  3 mL Intravenous Q12H    Recommendations: Stop vancomycin   Continue wound care  Assessment: She has multiple areas of gangrene particularly of the calcaneal area and followed by wound care.  Exam findings c/w dry gangrene with no purulence or signs of infection.  Xray findings certainly with bone destruction from the underlying process.  She has been seen by Dr. Karin Lieu of Vascular surgery and would require an AKA for this.  Also seen by Dr. Lajoyce Corners and no other options other than wound care.  Antibiotics have no role in this for cure of the wounds and any potential osteomyelitis that may be there.  Additionally, no evidence that antibiotics will prevent future bacterial seeding or future infections.   Can stop antibiotics related to her foot wounds.    HPI: [name] is a 89 y.o. female with a history of chronic hypoxic respiratory failure on chronic oxygen at home, PAD came to the ED from her facility for hypoxia.  She has ongoing gangrene of the right foot and an xray was done notable for bone destruction at the site of the gangrenous process.  Also with  several other locations of wounds and gangrene on the foot.  She has reportedly been on antibiotics for this.  Followed by wound care and last seen on 12/02/22.  Felt to be doing well and improving.  No purulence.     Review of  Systems:  Constitutional: negative for fevers and chills All other systems reviewed and are negative    Past Medical History:  Diagnosis Date   Atrial fibrillation (HCC)    CARCINOMA, BREAST    CHEST PAIN    DEGENERATIVE JOINT DISEASE    Diastolic heart failure (HCC) 08/03/2008   Qualifier: Diagnosis of  By: Kem Parkinson     Difficult intubation    Edema    GIB (gastrointestinal bleeding) 05/24/2015   Humerus fracture 09/12/2020   HYPERTENSION    Long term (current) use of anticoagulants    Malignant neoplasm of female breast (HCC) 08/03/2008   Qualifier: Diagnosis of  By: Kem Parkinson     Unspecified diastolic heart failure     Social History   Tobacco Use   Smoking status: Never   Smokeless tobacco: Never  Vaping Use   Vaping status: Never Used  Substance Use Topics   Alcohol use: No   Drug use: No    Family History  Problem Relation Age of Onset   CAD Mother    Heart attack Mother    Hypertension Mother    CAD Father    Stroke Brother    Heart attack Brother     Allergies  Allergen Reactions   Morphine And Codeine Other (See Comments)    Unknown reaction   Sulfa Antibiotics Other (See Comments)  Unknown reaction   Demerol [Meperidine Hcl] Nausea And Vomiting   Nsaids Other (See Comments)    Bleeding ulcer   Tolectin [Tolmetin] Other (See Comments)    Bleeding ulcer    Physical Exam: Constitutional: in no apparent distress  Vitals:   12/13/22 0830 12/13/22 0900  BP: (!) 153/57 (!) 131/55  Pulse: 61 65  Resp: (!) 22 (!) 25  Temp:    SpO2: 99% 97%   EYES: anicteric Respiratory: normal respiratory effort Musculoskeletal: foot wounds of right leg personally examined and gangrene of heel area, ankle, open wounds.   Lab Results  Component Value Date   WBC 10.9 (H) 12/13/2022   HGB 9.6 (L) 12/13/2022   HCT 31.4 (L) 12/13/2022   MCV 95.7 12/13/2022   PLT 321 12/13/2022    Lab Results  Component Value Date   CREATININE 0.61  12/13/2022   BUN 39 (H) 12/13/2022   NA 143 12/13/2022   K 3.6 12/13/2022   CL 103 12/13/2022   CO2 30 12/13/2022    Lab Results  Component Value Date   ALT 30 12/11/2022   AST 26 12/11/2022   ALKPHOS 141 (H) 12/11/2022     Microbiology: Recent Results (from the past 240 hour(s))  SARS Coronavirus 2 by RT PCR (hospital order, performed in [facility] hospital lab) *cepheid single result test* Anterior Nasal Swab     Status: None   Collection Time: 12/11/22 11:13 PM   Specimen: Anterior Nasal Swab  Result Value Ref Range Status   SARS Coronavirus 2 by RT PCR NEGATIVE NEGATIVE Final    Comment: Performed at [facility] Lab, [address], [place], [place] [address]  Blood culture (routine x 2)     Status: None (Preliminary result)   Collection Time: 12/12/22  2:28 AM   Specimen: BLOOD  Result Value Ref Range Status   Specimen Description BLOOD BLOOD LEFT ARM  Final   Special Requests   Final    BOTTLES DRAWN AEROBIC AND ANAEROBIC Blood Culture adequate volume   Culture   Final    NO GROWTH 1 DAY Performed at [facility], [address], [place], [place] [address]    Report Status PENDING  Incomplete  Blood culture (routine x 2)     Status: None (Preliminary result)   Collection Time: 12/12/22  2:28 AM   Specimen: BLOOD  Result Value Ref Range Status   Specimen Description BLOOD BLOOD LEFT ARM  Final   Special Requests   Final    BOTTLES DRAWN AEROBIC AND ANAEROBIC Blood Culture adequate volume   Culture   Final    NO GROWTH 1 DAY Performed at [facility], [address] [place], [place] [address]    Report Status PENDING  Incomplete  Respiratory (~20 pathogens) panel by PCR     Status: None   Collection Time: 12/12/22  5:12 AM   Specimen: Nasopharyngeal Swab; Respiratory  Result Value Ref Range Status   Adenovirus NOT DETECTED NOT DETECTED Final   Coronavirus 229E NOT DETECTED NOT DETECTED Final    Comment: (NOTE) The Coronavirus on the  Respiratory Panel, DOES NOT test for the novel  Coronavirus (2019 nCoV)    Coronavirus HKU1 NOT DETECTED NOT DETECTED Final   Coronavirus NL63 NOT DETECTED NOT DETECTED Final   Coronavirus OC43 NOT DETECTED NOT DETECTED Final   Metapneumovirus NOT DETECTED NOT DETECTED Final   Rhinovirus / Enterovirus NOT DETECTED NOT DETECTED Final   Influenza A  NOT DETECTED NOT DETECTED Final   Influenza B NOT DETECTED NOT DETECTED Final   Parainfluenza Virus 1 NOT DETECTED NOT DETECTED Final   Parainfluenza Virus 2 NOT DETECTED NOT DETECTED Final   Parainfluenza Virus 3 NOT DETECTED NOT DETECTED Final   Parainfluenza Virus 4 NOT DETECTED NOT DETECTED Final   Respiratory Syncytial Virus NOT DETECTED NOT DETECTED Final   Bordetella pertussis NOT DETECTED NOT DETECTED Final   Bordetella Parapertussis NOT DETECTED NOT DETECTED Final   Chlamydophila pneumoniae NOT DETECTED NOT DETECTED Final   Mycoplasma pneumoniae NOT DETECTED NOT DETECTED Final    Comment: Performed at [facility], [address] [place], [place] [address]  MRSA Next Gen by PCR, Nasal     Status: None   Collection Time: 12/12/22 11:39 AM   Specimen: Nasal Mucosa; Nasal Swab  Result Value Ref Range Status   MRSA by PCR Next Gen NOT DETECTED NOT DETECTED Final    Comment: (NOTE) The GeneXpert MRSA Assay (FDA approved for NASAL specimens only), is one component of a comprehensive MRSA colonization surveillance program. It is not intended to diagnose MRSA infection nor to guide or monitor treatment for MRSA infections. Test performance is not FDA approved in patients less than 8 years old. Performed at [facility], [address], [place], [place] [address]     Gardiner Barefoot, MD [facility] [email or web address] 12/13/2022, 12:04 PM

## 2022-12-14 DIAGNOSIS — J9601 Acute respiratory failure with hypoxia: Secondary | ICD-10-CM | POA: Diagnosis not present

## 2022-12-14 LAB — CBC
HCT: 35.5 % — ABNORMAL LOW (ref 36.0–46.0)
Hemoglobin: 10.8 g/dL — ABNORMAL LOW (ref 12.0–15.0)
MCH: 29.3 pg (ref 26.0–34.0)
MCHC: 30.4 g/dL (ref 30.0–36.0)
MCV: 96.5 fL (ref 80.0–100.0)
Platelets: 368 10*3/uL (ref 150–400)
RBC: 3.68 MIL/uL — ABNORMAL LOW (ref 3.87–5.11)
RDW: 14.6 % (ref 11.5–15.5)
WBC: 9 10*3/uL (ref 4.0–10.5)
nRBC: 0 % (ref 0.0–0.2)

## 2022-12-14 LAB — BASIC METABOLIC PANEL
Anion gap: 12 (ref 5–15)
BUN: 39 mg/dL — ABNORMAL HIGH (ref 8–23)
CO2: 30 mmol/L (ref 22–32)
Calcium: 9 mg/dL (ref 8.9–10.3)
Chloride: 104 mmol/L (ref 98–111)
Creatinine, Ser: 0.72 mg/dL (ref 0.44–1.00)
GFR, Estimated: >60 mL/min (ref >60)
Glucose, Bld: 93 mg/dL (ref 70–99)
Potassium: 3.5 mmol/L (ref 3.5–5.1)
Sodium: 146 mmol/L — ABNORMAL HIGH (ref 135–145)

## 2022-12-14 LAB — GLUCOSE, CAPILLARY
Glucose-Capillary: 108 mg/dL — ABNORMAL HIGH (ref 70–99)
Glucose-Capillary: 118 mg/dL — ABNORMAL HIGH (ref 70–99)
Glucose-Capillary: 79 mg/dL (ref 70–99)
Glucose-Capillary: 98 mg/dL (ref 70–99)

## 2022-12-14 MED ORDER — FUROSEMIDE 10 MG/ML IJ SOLN
40.0000 mg | Freq: Once | INTRAMUSCULAR | Status: AC
  Administered 2022-12-14: 40 mg via INTRAVENOUS
  Filled 2022-12-14: qty 4

## 2022-12-14 MED ORDER — FUROSEMIDE 40 MG PO TABS: 40.0000 mg | ORAL_TABLET | Freq: Every day | ORAL | Status: DC

## 2022-12-14 MED ORDER — FUROSEMIDE 40 MG PO TABS
40.0000 mg | ORAL_TABLET | Freq: Two times a day (BID) | ORAL | Status: DC
  Administered 2022-12-15 – 2022-12-16 (×2): 40 mg via ORAL
  Filled 2022-12-14 (×4): qty 1

## 2022-12-14 MED ORDER — LEVOFLOXACIN 750 MG PO TABS
750.0000 mg | ORAL_TABLET | ORAL | Status: DC
  Administered 2022-12-15: 750 mg via ORAL
  Filled 2022-12-14 (×2): qty 1

## 2022-12-14 NOTE — Progress Notes (Addendum)
HOSPITALIST ROUNDING NOTE [name] ZOX:[id number]  DOB: Sep 08, 1922  DOA: 2023-01-01  PCP: Eloisa Northern, MD  12/14/2022,8:57 AM   LOS: 2 days      Code Status: Full code From: [name]   Current Dispo: Probably back to [facility]    89 white female known history of atrial fibrillation CHADVASC >3 hasbled >3 not on anticoagulation (UGIB EGD 05/28/2015 antral ulcers) intolerant to beta-blocker secondary to bradycardia-slow ventricular response noted Chronically bedbound needs Michiel Sites Breast cancer right lumpectomy XRT 2002-previously followed at [facility] last seen 2017 Prior right humeral fracture in 2022  Admission 3/28 through 06/05/2022 chronic heel ulcer not a surgical candidate (right femoral fracture 2023) patient was recommended at the time AKA MRI showed early osteo patient was treated with oral Vantin Doxy X 8 weeks-patient adamantly refused surgery despite osteo on MRI  Follows with wound care [facility] for the wound  Admission 01-Jan-2023 DOE hypoxia sats 80s not on oxygen at home-white count 18 hemoglobin 11 BNP 206 found to be in a flutter 3-1 per ED Rx DuoNeb Solu-Medrol and placed on BiPAP CXR interstitial edema atelectasis----- CT chest compressive atelectasis upper lobe atelectasis RML scarring At baseline quite functional awake alert she was somnolent (baseline she manages her own finances is 72 dependent on ADLs) 10/13 ID consulted-recommend stopping vancomycin-only needs local wound care Transiently in ICU 10/12 through 10/13 and then transferred back to [facility] on 10/14   Plan  Acute hypoxic respiratory failure 2/2 aspiration PNA--has chronic respiratory failure on 2 L 2/2 COPD BiPAP discontinued 10/13 Continue cefepime -Legionella strep pneumo never collected?  RVP COVID etc. Negative--- de-escalate in 1 to 2 days to oral antibiotics  Chronic failure COPD Continue Breo Ellipta 1 daily DuoNeb every 6 as needed albuterol every 4 as  needed  Chronic osteomyelitis right lower extremity with dry gangrene superimposed on peripheral vascular disease Has previously refused above-knee amputation--no operative management currently as not wet gangrene Wounds appear unsalvageable with ulcerations as per prior pictures necrotic tissues-paint all wounds with Betadine and allowed to air dry cover with Xeroform and ABD pads and secure with Kerlix-Wound care---  Will schedule outpatient appointment if she is interested in operative management  with Dr. Lajoyce Corners   Mild demand ischemiaSuperimposed on HFpEF BNP 200 (baseline 110 Troponins in the 30-40 range Secondary to possible sepsis-repeat CXR a.m.-Lasix 40 from home held-resume at every other day dosing closer to discharge Cannot use GDMT hydralazine 25 every 4  Hypothyroidism Continue Synthroid 50 daily  Atrial fibrillation CHADVASC >3 not on anticoagulation History of bleed Seems to be rate controlled  Hypothyroidism Continue Synthroid 50 at bedtime-outpatient TSH in 2 weeks   DVT prophylaxis: Discontinue droplet precaution  Status is: Inpatient Remains inpatient appropriate because:   Needs de-escalation of antibiotics in 24 hours or so and may be return to [facility]   Subjective: Awake coherent no distress but quite weak quite frail says repeatedly she wants to "die" Redirectable however  Objective + exam Vitals:   12/14/22 0020 12/14/22 0345 12/14/22 0808 12/14/22 0828  BP: (!) 142/73 (!) 162/89 (!) 180/68   Pulse: 73 63 62   Resp: 20 20 16    Temp: (!) 96.4 F (35.8 C) (!) 96.1 F (35.6 C) (!) 96.4 F (35.8 C)   TempSrc: Axillary Axillary Axillary   SpO2: 100% 93% 99% 99%  Weight:  92.5 kg    Height:       Filed Weights   12/12/22 0510 12/14/22 0345  Weight:  86.1 kg 92.5 kg    Examination:  Awake but not distress S1-S2 normal no murmur no rub no gallop ROM intact but quite weak and frail Abdomen soft no rebound Chest clear Wounds have  dressings over them not examined today wound pictures and images reviewed  Data Reviewed: reviewed   CBC    Component Value Date/Time   WBC 9.0 12/14/2022 0402   RBC 3.68 (L) 12/14/2022 0402   HGB 10.8 (L) 12/14/2022 0402   HGB 14.1 08/30/2017 1629   HCT 35.5 (L) 12/14/2022 0402   HCT 44.2 08/30/2017 1629   PLT 368 12/14/2022 0402   PLT 238 08/30/2017 1629   MCV 96.5 12/14/2022 0402   MCV 96 08/30/2017 1629   MCH 29.3 12/14/2022 0402   MCHC 30.4 12/14/2022 0402   RDW 14.6 12/14/2022 0402   RDW 13.9 08/30/2017 1629   LYMPHSABS 0.4 (L) 01/06/2023 2313   MONOABS 1.9 (H) 2023-01-06 2313   EOSABS 0.0 01-06-2023 2313   BASOSABS 0.0 01/06/23 2313      Latest Ref Rng & Units 12/14/2022    4:02 AM 12/13/2022    3:04 AM 12/12/2022   12:47 PM  CMP  Glucose 70 - 99 mg/dL 93  96    BUN 8 - 23 mg/dL 39  39    Creatinine 3.66 - 1.00 mg/dL 4.40  3.47  4.25   Sodium 135 - 145 mmol/L 146  143    Potassium 3.5 - 5.1 mmol/L 3.5  3.6    Chloride 98 - 111 mmol/L 104  103    CO2 22 - 32 mmol/L 30  30    Calcium 8.9 - 10.3 mg/dL 9.0  8.4       Scheduled Meds:  enoxaparin (LOVENOX) injection  40 mg Subcutaneous Q24H   fluticasone furoate-vilanterol  1 puff Inhalation Daily   furosemide  40 mg Oral Daily   ipratropium-albuterol  3 mL Nebulization Q6H   levothyroxine  50 mcg Oral Q0600   mouth rinse  15 mL Mouth Rinse 4 times per day   sodium chloride flush  3 mL Intravenous Q12H   Continuous Infusions:  ceFEPime (MAXIPIME) IV 2 g (12/14/22 0438)    Time 40  Rhetta Mura, MD  [facility]

## 2022-12-14 NOTE — Plan of Care (Signed)
  Problem: Clinical Measurements: Goal: Ability to maintain clinical measurements within normal limits will improve Outcome: Progressing   

## 2022-12-14 NOTE — Plan of Care (Signed)
  Problem: Clinical Measurements: Goal: Respiratory complications will improve Outcome: Progressing   Problem: Coping: Goal: Level of anxiety will decrease Outcome: Progressing   Problem: Elimination: Goal: Will not experience complications related to urinary retention Outcome: Progressing   Problem: Education: Goal: Knowledge of General Education information will improve Description: Including pain rating scale, medication(s)/side effects and non-pharmacologic comfort measures Outcome: Not Progressing   Problem: Health Behavior/Discharge Planning: Goal: Ability to manage health-related needs will improve Outcome: Not Progressing   Problem: Activity: Goal: Risk for activity intolerance will decrease Outcome: Not Progressing   Problem: Nutrition: Goal: Adequate nutrition will be maintained Outcome: Not Progressing   Problem: Skin Integrity: Goal: Risk for impaired skin integrity will decrease Outcome: Not Progressing

## 2022-12-14 NOTE — Progress Notes (Signed)
RT note. Patient CPT Vest completed at this time. Patient was stating she could not breath 3 minutes into the treatment. Patient started to desat to 88%, Patient removed from vest at this time. Sat 90%, RN aware, RT will continue to monitor.    12/14/22 1326  Chest Physiotherapy Tx  $ Chest Physiotherapy (Vest, Bed, Metaneb) 1  $ Chest Vest Medium Yes  CPT Delivery Source Chest vest  CPT Duration 3  CPT Chest Site Full range  Post-Treatment Respirations 24  Cough Non-productive;Congested  Sputum Specimen Source Spontaneous cough  Position Supine  CPT Treatment Tolerance Tolerated poorly (pt. stated she could not breath and wanted it off)  Post-Treatment Pulse 63  Cough Upon request  Respiratory  Bilateral Breath Sounds Coarse crackles

## 2022-12-14 NOTE — Progress Notes (Signed)
Update from [facility] [name] who knows the patient very well  patient started 9 days prior both Lasix antibiotics but for some reason Lasix was not given-brought to emergency room 2/2 shortness of breath worsening symptoms Has been chronically on Lasix as well as oxygen for several months etiology of the need is unclear but presumed to be heart failure-has been on suppressive antibiotics chronically additionally for her lower extremity wounds and she absolutely refuses amputation unless absolutely needed  P Will de-escalate cefepime today as has received several days of this, will start Lasix 40 IV in now. and then continue Lasix 40 twice daily for 3 days and then de-escalate to once daily She may be able to discharge if her wounds on her lower extremities do not look infected She is a little bit hypothermic on review but as long as she is coherent and does not appear septic I think she can stabilized for discharge in 48 hours

## 2022-12-15 DIAGNOSIS — J9601 Acute respiratory failure with hypoxia: Secondary | ICD-10-CM | POA: Diagnosis not present

## 2022-12-15 LAB — COMPREHENSIVE METABOLIC PANEL
ALT: 20 U/L (ref 0–44)
AST: 18 U/L (ref 15–41)
Albumin: 2.3 g/dL — ABNORMAL LOW (ref 3.5–5.0)
Alkaline Phosphatase: 99 U/L (ref 38–126)
Anion gap: 10 (ref 5–15)
BUN: 31 mg/dL — ABNORMAL HIGH (ref 8–23)
CO2: 32 mmol/L (ref 22–32)
Calcium: 9.1 mg/dL (ref 8.9–10.3)
Chloride: 106 mmol/L (ref 98–111)
Creatinine, Ser: 0.76 mg/dL (ref 0.44–1.00)
GFR, Estimated: >60 mL/min (ref >60)
Glucose, Bld: 106 mg/dL — ABNORMAL HIGH (ref 70–99)
Potassium: 3.3 mmol/L — ABNORMAL LOW (ref 3.5–5.1)
Sodium: 148 mmol/L — ABNORMAL HIGH (ref 135–145)
Total Bilirubin: 0.6 mg/dL (ref 0.3–1.2)
Total Protein: 6.3 g/dL — ABNORMAL LOW (ref 6.5–8.1)

## 2022-12-15 LAB — GLUCOSE, CAPILLARY
Glucose-Capillary: 101 mg/dL — ABNORMAL HIGH (ref 70–99)
Glucose-Capillary: 116 mg/dL — ABNORMAL HIGH (ref 70–99)
Glucose-Capillary: 123 mg/dL — ABNORMAL HIGH (ref 70–99)

## 2022-12-15 LAB — CBC
HCT: 34.3 % — ABNORMAL LOW (ref 36.0–46.0)
Hemoglobin: 10.5 g/dL — ABNORMAL LOW (ref 12.0–15.0)
MCH: 28.4 pg (ref 26.0–34.0)
MCHC: 30.6 g/dL (ref 30.0–36.0)
MCV: 92.7 fL (ref 80.0–100.0)
Platelets: 354 10*3/uL (ref 150–400)
RBC: 3.7 MIL/uL — ABNORMAL LOW (ref 3.87–5.11)
RDW: 14.3 % (ref 11.5–15.5)
WBC: 8.2 10*3/uL (ref 4.0–10.5)
nRBC: 0 % (ref 0.0–0.2)

## 2022-12-15 MED ORDER — HYDRALAZINE HCL 25 MG PO TABS
25.0000 mg | ORAL_TABLET | Freq: Four times a day (QID) | ORAL | Status: DC
  Administered 2022-12-15 – 2022-12-16 (×3): 25 mg via ORAL
  Filled 2022-12-15 (×6): qty 1

## 2022-12-15 MED ORDER — IPRATROPIUM-ALBUTEROL 0.5-2.5 (3) MG/3ML IN SOLN: 3.0000 mL | Freq: Four times a day (QID) | RESPIRATORY_TRACT | Status: DC | PRN

## 2022-12-15 NOTE — Care Management Important Message (Signed)
Important Message  Patient Details  Name: JELENA MALICOAT MRN: [id number] Date of Birth: 1922/12/23   Important Message Given:  Yes - Medicare IM     Sherilyn Banker 12/15/2022, 3:28 PM

## 2022-12-15 NOTE — Progress Notes (Signed)
RT NOT: CPT held at this time. PT is agitated this morning and trying to push this RT away and remove mask when RT administered breathing treatment, RT worked with PT on flutter valve but PT unable to perform at this time. Vitals are stable. RT will continue to monitor.

## 2022-12-15 NOTE — Progress Notes (Signed)
Pt no longer taking fluids or food per family. Friend states that she is not able to swallow and spitting her fluid out. Family says that she is coughing.  I have not seen pt coughing or choking. She has spit out water and 2% milk for me. I gave her whole milk and some ensure earlier and she was able to drink through straw. Her nephew was able to give her a few bites of eggs for breakfast. Pt resting with call bell within reach.  Will continue to monitor. I have asked family not to feed her or give her liquids if she is choking. Thomas Hoff, RN

## 2022-12-15 NOTE — Progress Notes (Signed)
HOSPITALIST ROUNDING NOTE [name] OZH:[id number]  DOB: 1922/09/29  DOA: 12/14/2022  PCP: Eloisa Northern, MD  12/15/2022,1:08 PM   LOS: 3 days      Code Status: Full code From: [name]   Current Dispo: Probably back to [employer] white female known history of atrial fibrillation CHADVASC >3 hasbled >3 not on anticoagulation (UGIB EGD 05/28/2015 antral ulcers) intolerant to beta-blocker secondary to bradycardia-slow ventricular response noted Chronically bedbound needs [facility] Breast cancer right lumpectomy XRT 2002-previously followed at [facility] last seen 2017 Prior right humeral fracture in 2022  Admission 3/28 through 06/05/2022 chronic heel ulcer not a surgical candidate (right femoral fracture 2023) patient was recommended at the time AK[facility] MRI showed early osteo patient was treated with oral Vantin Doxy X 8 weeks-patient adamantly refused surgery despite osteo on MRI  Follows with [facility] care [facility] for the wound  Admission 12/21/2022 DOE hypoxia sats 80s not on oxygen at home-white count 18 hemoglobin 11 BNP 206 found to be in a flutter 3-1 per ED Rx DuoNeb Solu-Medrol and placed on BiPAP CXR interstitial edema atelectasis----- CT chest compressive atelectasis upper lobe atelectasis RML scarring At baseline quite functional awake alert she was somnolent (baseline she manages her own finances is 72 dependent on ADLs) 10/13 ID consulted-recommend stopping vancomycin-only needs local wound care Transiently in ICU 10/12 through 10/13 and then transferred back to [facility] 10/14   Plan  Acute hypoxic respiratory failure 2/2 aspiration PNA--has chronic respiratory failure on 2 L 2/2 COPD BiPAP discontinued 10/13 Continue cefepime de-escalate in 24 to 48 hours to possible Levaquin-white count is improved  Decompensated HFpEF BNP 200 on admission gray zone troponins only no chest pain Continue hydralazine 25 every 4 ACE inhibitor or ARB relatively  contraindicated given age and lack of benefit Was supposed to be on higher dose Lasix over the past several weeks (usually takes 40 daily)--given IV now 40 twice daily and change based on I/O/weight back to Lasix daily in 1 to 2 days--need to go slow with diuresis do not want to volume depleted  Chronic failure COPD Continue Breo Ellipta 1 daily DuoNeb every 6 as needed albuterol every 4 as needed  Chronic osteomyelitis right lower extremity with dry gangrene superimposed on peripheral vascular disease Has previously refused above-knee amputation--no operative management currently as not wet gangrene Wounds appear unsalvageable with ulcerations --paint all wounds with Betadine and allowed to air dry cover with Xeroform and ABD pads and secure with Kerlix-Wound care--- see below updated pictures Per family members these actually seem to have improved It seems that she was taking chronic Levaquin and would resume that ongoing 750 daily for the wounds at discharge  Hypothyroidism Continue Synthroid 50 daily Obtain outpatient TSH in about 2 week  Atrial fibrillation CHADVASC >3 not on anticoagulation History of bleed Seems to be rate controlled   DVT prophylaxis: Discontinue droplet precaution  Status is: Inpatient Remains inpatient appropriate because:   Needs de-escalation of antibiotics in  48 hours and return to skilled facility  Subjective:  Looks fair desats to 86% off oxygen no chest pain overall seems a little improved   Objective + exam Vitals:   12/15/22 0702 12/15/22 0705 12/15/22 0827 12/15/22 1205  BP:   (!) 172/64 (!) 135/59  Pulse: 65  64 66  Resp: 17  20 19   Temp:   (!) 97.3 F (36.3 C) 97.7 F (36.5 C)  TempSrc:   Axillary Oral  SpO2:  95% 95% 93% 94%  Weight:      Height:       Filed Weights   12/12/22 0510 12/14/22 0345  Weight: 86.1 kg 92.5 kg       Examination:  Awake no distress Chest has rhonchi no wheeze no rales Abdomen is soft Right  upper humerus seems to have a callus I examined the wounds behind the calf on the right side as below   Data Reviewed: reviewed   CBC    Component Value Date/Time   WBC 9.0 12/14/2022 0402   RBC 3.68 (L) 12/14/2022 0402   HGB 10.8 (L) 12/14/2022 0402   HGB 14.1 08/30/2017 1629   HCT 35.5 (L) 12/14/2022 0402   HCT 44.2 08/30/2017 1629   PLT 368 12/14/2022 0402   PLT 238 08/30/2017 1629   MCV 96.5 12/14/2022 0402   MCV 96 08/30/2017 1629   MCH 29.3 12/14/2022 0402   MCHC 30.4 12/14/2022 0402   RDW 14.6 12/14/2022 0402   RDW 13.9 08/30/2017 1629   LYMPHSABS 0.4 (L) 12/14/2022 2313   MONOABS 1.9 (H) 12/18/2022 2313   EOSABS 0.0 12/18/2022 2313   BASOSABS 0.0 12/25/2022 2313      Latest Ref Rng & Units 12/14/2022    4:02 AM 12/13/2022    3:04 AM 12/12/2022   12:47 PM  CMP  Glucose 70 - 99 mg/dL 93  96    BUN 8 - 23 mg/dL 39  39    Creatinine 1.30 - 1.00 mg/dL 8.65  7.84  6.96   Sodium 135 - 145 mmol/L 146  143    Potassium 3.5 - 5.1 mmol/L 3.5  3.6    Chloride 98 - 111 mmol/L 104  103    CO2 22 - 32 mmol/L 30  30    Calcium 8.9 - 10.3 mg/dL 9.0  8.4       Scheduled Meds:  enoxaparin (LOVENOX) injection  40 mg Subcutaneous Q24H   fluticasone furoate-vilanterol  1 puff Inhalation Daily   furosemide  40 mg Oral BID   ipratropium-albuterol  3 mL Nebulization Q6H   levofloxacin  750 mg Oral Q48H   levothyroxine  50 mcg Oral Q0600   mouth rinse  15 mL Mouth Rinse 4 times per day   sodium chloride flush  3 mL Intravenous Q12H   Continuous Infusions:    Time 40  Rhetta Mura, MD  [facility]

## 2022-12-15 NOTE — Evaluation (Signed)
Physical Therapy Evaluation Patient Details Name: [name] MRN: [id number] DOB: 09/22/1922 Today's Date: 12/15/2022  History of Present Illness  [name] is a 89 y.o. female who was brought in from her nursing home after having desaturation into the mid 80s on her home 2 L O2. PMH: hx of chronic hypoxic respiratory failure on 2 L around-the-clock, PAD with dry gangrene involving the right foot, chronic osteomyelitis, chronic recent antibiotic use, bedbound after femur fracture, A-fib, not on anticoagulation, hypertension, hypothyroidism,   Clinical Impression  Pt admitted with above. Pt near baseline functionally but not cognitively. Per nephew, [name], pt oriented, alert, sharp memory, does her own finances and can feed herself but is hoyer lifted OOB to a lift chair daily by [employer] and remains dependent for dressing and bathing. Pt now confused, unable to follow commands, crying, muffled speech and asking "what have you done to me?" Pt dependent for transfer to EOB with now initiation to aide in transfer but more so resistance. Pt remains appropriate to return to LTC at [facility]. Acute PT to monitor patient while in hospital.        If plan is discharge home, recommend the following:     Can travel by private vehicle   No    Equipment Recommendations None recommended by PT  Recommendations for Other Services       Functional Status Assessment Patient has had a recent decline in their functional status and/or demonstrates limited ability to make significant improvements in function in a reasonable and predictable amount of time     Precautions / Restrictions Precautions Precautions: Fall Precaution Comments: wounds on R foot Restrictions Weight Bearing Restrictions: No      Mobility  Bed Mobility Overal bed mobility: Needs Assistance Bed Mobility: Supine to Sit, Sit to Supine     Supine to sit: Total assist, +2 for physical assistance Sit to  supine: Total assist, +2 for physical assistance   General bed mobility comments: pt with no active assist with transfer, helicopter transfer to EOB, dependent on posterior support to maintain EOB balance    Transfers                   General transfer comment: hoyer lifted at baseline    Ambulation/Gait               General Gait Details: unable  Stairs            Wheelchair Mobility     Tilt Bed    Modified Rankin (Stroke Patients Only)       Balance Overall balance assessment: Needs assistance Sitting-balance support: Feet supported, Bilateral upper extremity supported Sitting balance-Leahy Scale: Zero Sitting balance - Comments: dependent on posterior maximal support                                     Pertinent Vitals/Pain Pain Assessment Pain Assessment: Faces Faces Pain Scale: Hurts even more Pain Location: generalized with movement, noted grimacing Pain Descriptors / Indicators: Grimacing    Home Living Family/patient expects to be discharged to:: Skilled nursing facility                   Additional Comments: pt is from LTC at [facility]    Prior Function Prior Level of Function : Needs assist             Mobility Comments: hoyer  lift to lift chair ADLs Comments: dependent for bathing and dressing but was able to feed self and sign checks     Extremity/Trunk Assessment   Upper Extremity Assessment Upper Extremity Assessment: Generalized weakness (noted edema, minimal active movement at baseline)    Lower Extremity Assessment Lower Extremity Assessment:  (not active movement bilaterally to command or initiated on own)       Communication   Communication Communication: Hearing impairment  Cognition Arousal: Alert Behavior During Therapy: Flat affect (crying) Overall Cognitive Status: Impaired/Different from baseline                                 General Comments: per nephew  [name] pt is typical oriented, has sharp memory, can sign her own check/do financials. pt now unable to follow commands, is very distracted, confused, asking "what have you all done to me?"        General Comments General comments (skin integrity, edema, etc.): noted edema in L UE, necrotic ulcer on R foot, covered in dressings    Exercises     Assessment/Plan    PT Assessment Patient needs continued PT services  PT Problem List Decreased strength;Decreased range of motion;Decreased activity tolerance;Decreased mobility;Decreased coordination       PT Treatment Interventions Functional mobility training;Therapeutic activities;Therapeutic exercise    PT Goals (Current goals can be found in the Care Plan section)  Acute Rehab PT Goals Patient Stated Goal: didn't state PT Goal Formulation: With family Time For Goal Achievement: 12/29/22 Potential to Achieve Goals: Fair    Frequency Min 1X/week     Co-evaluation               AM-PAC PT "6 Clicks" Mobility  Outcome Measure Help needed turning from your back to your side while in a flat bed without using bedrails?: Total Help needed moving from lying on your back to sitting on the side of a flat bed without using bedrails?: Total Help needed moving to and from a bed to a chair (including a wheelchair)?: Total Help needed standing up from a chair using your arms (e.g., wheelchair or bedside chair)?: Total Help needed to walk in hospital room?: Total Help needed climbing 3-5 steps with a railing? : Total 6 Click Score: 6    End of Session   Activity Tolerance: Patient limited by pain Patient left: in bed;with call bell/phone within reach;with family/visitor present Nurse Communication: Mobility status PT Visit Diagnosis: Muscle weakness (generalized) (M62.81)    Time: 4098-1191 PT Time Calculation (min) (ACUTE ONLY): 18 min   Charges:   PT Evaluation $PT Eval Moderate Complexity: 1 Mod   PT General Charges $$  ACUTE PT VISIT: 1 Visit         [name], PT, DPT Acute Rehabilitation Services Secure chat preferred Office #: [phone]   [name] 12/15/2022, 3:11 PM

## 2022-12-16 ENCOUNTER — Inpatient Hospital Stay (HOSPITAL_COMMUNITY): Payer: Medicare PPO

## 2022-12-16 DIAGNOSIS — J9601 Acute respiratory failure with hypoxia: Secondary | ICD-10-CM | POA: Diagnosis not present

## 2022-12-16 LAB — CBC
HCT: 33.4 % — ABNORMAL LOW (ref 36.0–46.0)
Hemoglobin: 10.3 g/dL — ABNORMAL LOW (ref 12.0–15.0)
MCH: 28.8 pg (ref 26.0–34.0)
MCHC: 30.8 g/dL (ref 30.0–36.0)
MCV: 93.3 fL (ref 80.0–100.0)
Platelets: 316 10*3/uL (ref 150–400)
RBC: 3.58 MIL/uL — ABNORMAL LOW (ref 3.87–5.11)
RDW: 14.5 % (ref 11.5–15.5)
WBC: 8.3 10*3/uL (ref 4.0–10.5)
nRBC: 0 % (ref 0.0–0.2)

## 2022-12-16 LAB — COMPREHENSIVE METABOLIC PANEL
ALT: 15 U/L (ref 0–44)
AST: 26 U/L (ref 15–41)
Albumin: 2.3 g/dL — ABNORMAL LOW (ref 3.5–5.0)
Alkaline Phosphatase: 93 U/L (ref 38–126)
Anion gap: 12 (ref 5–15)
BUN: 35 mg/dL — ABNORMAL HIGH (ref 8–23)
CO2: 31 mmol/L (ref 22–32)
Calcium: 8.9 mg/dL (ref 8.9–10.3)
Chloride: 105 mmol/L (ref 98–111)
Creatinine, Ser: 0.68 mg/dL (ref 0.44–1.00)
GFR, Estimated: >60 mL/min (ref >60)
Glucose, Bld: 109 mg/dL — ABNORMAL HIGH (ref 70–99)
Potassium: 3.7 mmol/L (ref 3.5–5.1)
Sodium: 148 mmol/L — ABNORMAL HIGH (ref 135–145)
Total Bilirubin: 0.8 mg/dL (ref 0.3–1.2)
Total Protein: 6 g/dL — ABNORMAL LOW (ref 6.5–8.1)

## 2022-12-16 LAB — GLUCOSE, CAPILLARY
Glucose-Capillary: 101 mg/dL — ABNORMAL HIGH (ref 70–99)
Glucose-Capillary: 116 mg/dL — ABNORMAL HIGH (ref 70–99)
Glucose-Capillary: 108 mg/dL — ABNORMAL HIGH (ref 70–99)
Glucose-Capillary: 100 mg/dL — ABNORMAL HIGH (ref 70–99)

## 2022-12-16 MED ORDER — POLYETHYLENE GLYCOL 3350 17 G PO PACK
17.0000 g | PACK | Freq: Two times a day (BID) | ORAL | Status: DC
  Filled 2022-12-16: qty 1

## 2022-12-16 MED ORDER — SENNOSIDES-DOCUSATE SODIUM 8.6-50 MG PO TABS
1.0000 | ORAL_TABLET | Freq: Two times a day (BID) | ORAL | Status: DC
  Administered 2022-12-16: 1 via ORAL
  Filled 2022-12-16 (×2): qty 1

## 2022-12-16 NOTE — IPAL (Addendum)
PROGRESS NOTE    [name]  MVH:[id number] DOB: 07-11-1922 DOA: 2022/12/31 PCP: Eloisa Northern, MD  Goals of care discussion -Lengthy discussion with patient's family today both at bedside and over the phone -Patient has voiced her wishes previously and that she would want "everything done" but patient does have advanced directives/HCPOA documentation in the system as of June 2023.  Unfortunately this documentation omits patient's medical wishes. -Patient expresses to me today that if she were to die she would not want CPR and attempts to resuscitate her.  She does express that she would like respiratory support to continue if her breathing got worse. Discussed with patient and family(Jonathan/Jan=HCPOA) that her dysphagia and risk of aspirating and decompensating is markedly elevated.  HCPoA both agree they would not want any heroic measures or life prolonging measures and would like to focus on comfort and quality of life -but felt compelled to comply with patient's prior wishes including respiratory support should she decompensate. Palliative care consulted to assist with ongoing discussion and end-of-life goals given patient's age comorbid conditions and risks of catastrophic events given her aspiration risks.   Assessment & Plan:   Principal Problem:   Acute hypoxic respiratory failure (HCC) Active Problems:   Acute respiratory failure with hypoxemia (HCC)  Time spent:  Azucena Fallen, DO [facility]  12/16/2022, 2:42 PM

## 2022-12-16 NOTE — Progress Notes (Signed)
PROGRESS NOTE    [name]  ZOX:[id number] DOB: December 23, 1922 DOA: 12/04/2022 PCP: Eloisa Northern, MD   Brief Narrative:  61 white female known history of atrial fibrillation CHADVASC >3 hasbled >3 not on anticoagulation (UGIB EGD 05/28/2015 antral ulcers) intolerant to beta-blocker secondary to bradycardia; Chronically bedbound needs lift; breast cancer right lumpectomy XRT 2002-previously followed at [facility] last seen 2017; prior right humeral fracture in 2022.  Patient admitted 12/20/2022 with dyspnea exertion and profound hypoxia.  Sats in the 80s, does not wear oxygen at baseline, EKG shows 3-1 flutter, initially placed on BiPAP for respiratory support.  Patient's baseline is functionally alert, bedbound, able to assist with finances and other complex tasks but relies on others for physical needs and mobility -she is not on oxygen at baseline.   Recent admission 3/28 through 06/05/2022 chronic heel ulcer not a surgical candidate (right femoral fracture 2023) patient was recommended AKA at that time. MRI showed early osteo patient was treated with oral Vantin Doxy X 8 weeks-patient adamantly refused surgery despite osteo on MRI. Follows with [facility] care [facility] Durene Cal for the wound   Assessment & Plan:   Principal Problem:   Acute hypoxic respiratory failure (HCC) Active Problems:   Acute respiratory failure with hypoxemia (HCC)  Goals of care discussion -Lengthy discussion with patient's family today both at bedside and over the phone -Patient has voiced her wishes previously and that she would want "everything done" but patient does have advanced directives/HCPOA documentation in the system as of June 2023.  Unfortunately this documentation omits patient's medical wishes. -Patient expresses to me today that if she were to die she would not want CPR and attempts to resuscitate her.  She does express that she would like respiratory support to continue if her breathing got  worse. Discussed with patient and family(Jonathan/Jan=HCPOA) that her dysphagia and risk of aspirating and decompensating is markedly elevated.  HCPoA both agree they would not want any heroic measures or life prolonging measures and would like to focus on comfort and quality of life -but felt compelled to comply with patient's prior wishes including respiratory support should she decompensate. Palliative care consulted to assist with ongoing discussion and end-of-life goals given patient's age comorbid conditions and risks of catastrophic events given her aspiration risks.  Acute hypoxic respiratory failure 2/2 aspiration PNA COPD, not in acute decompensation  BiPAP discontinued 10/13 Continue Levaquin Speech continues to follow, high risk for aspiration despite coaching/education Patient wishes to continue with p.o. intake despite risks, given this request we did discuss her CODE STATUS, patient states she would not want CPR but is currently agreeable to intubation/respiratory support   Decompensated HFpEF  Medication choices limited by hypotension, continue hydralazine ACE/ARB on hold, continue Lasix -creatinine stable, urine output improving Avoid overdiuresis, hypovolemic state  Chronic failure COPD Not in acute exacerbation, continue Breo Ellipta; DuoNeb q6h prn   Chronic osteomyelitis right lower extremity with dry gangrene superimposed on peripheral vascular disease Previously refused above-knee amputation--no operative management currently as not wet gangrene Wounds appear unsalvageable with ulcerations -wound care has been following, continue Betadine, Xeroform/ABD/Kerlix -Family reports patient was chronically on Levaquin -can continue inpatient   Hypothyroidism Continue Synthroid 50 daily  Atrial fibrillation CHADVASC >3 not on anticoagulation No anticoagulation in the setting of bleeding history  Rate controlled  DVT prophylaxis: enoxaparin (LOVENOX) injection 40 mg Start:  12/12/22 1600 SCDs Start: 12/12/22 0804   Code Status:   Code Status: Do not attempt resuscitation (DNR) PRE-ARREST  INTERVENTIONS DESIRED - NO CPR (Intubate only per discussion at bedside with family and patient 12/16/22)  Family Communication: At bedside, updated over the phone  Status is: Inpatient  Dispo: The patient is from: Home              Anticipated d/c is to: Home              Anticipated d/c date is: 24 to 48 hours              Patient currently not medically stable for discharge  Consultants:  None  Procedures:  None  Antimicrobials:  Levaquin  Subjective: No acute issues or events overnight denies nausea vomiting diarrhea constipation headache fevers chills or chest pain.  Appetite remains poor but reports she feels better than previous.  Objective: Vitals:   12/16/22 0623 12/16/22 0702 12/16/22 0747 12/16/22 0935  BP:   (!) 170/61   Pulse: 70 70 63 63  Resp:  16 18 20   Temp: 98.3 F (36.8 C)  98.4 F (36.9 C)   TempSrc: Oral  Oral   SpO2:  92% 94% 93%  Weight: 87.8 kg     Height:        Intake/Output Summary (Last 24 hours) at 12/16/2022 1003 Last data filed at 12/16/2022 0600 Gross per 24 hour  Intake 50 ml  Output 650 ml  Net -600 ml   Filed Weights   12/12/22 0510 12/14/22 0345 12/16/22 0623  Weight: 86.1 kg 92.5 kg 87.8 kg    Examination:  General exam: Appears calm and comfortable  Respiratory system: Clear to auscultation. Respiratory effort normal. Cardiovascular system: S1 & S2 heard, RRR. No JVD, murmurs, rubs, gallops or clicks. No pedal edema. Gastrointestinal system: Abdomen is nondistended, soft and nontender. No organomegaly or masses felt. Normal bowel sounds heard. Central nervous system: Alert and oriented. No focal neurological deficits. Extremities: Symmetric 5 x 5 power. Skin: No rashes, lesions or ulcers Psychiatry: Judgement and insight appear normal. Mood & affect appropriate.     Data Reviewed: I have personally  reviewed following labs and imaging studies  CBC: Recent Labs  Lab December 28, 2022 2313 12/12/22 0212 12/12/22 1247 12/13/22 0304 12/14/22 0402 12/15/22 2114 12/16/22 0352  WBC 18.7*   < > 11.3* 10.9* 9.0 8.2 8.3  NEUTROABS 16.5*  --   --   --   --   --   --   HGB 11.3*   < > 10.8* 9.6* 10.8* 10.5* 10.3*  HCT 37.3   < > 35.1* 31.4* 35.5* 34.3* 33.4*  MCV 95.4   < > 97.0 95.7 96.5 92.7 93.3  PLT 382   < > 345 321 368 354 316   < > = values in this interval not displayed.   Basic Metabolic Panel: Recent Labs  Lab 12/12/22 0625 12/12/22 0703 12/12/22 1247 12/13/22 0304 12/14/22 0402 12/15/22 2114 12/16/22 0352  NA 140 139  --  143 146* 148* 148*  K 4.7 4.7  --  3.6 3.5 3.3* 3.7  CL 100  --   --  103 104 106 105  CO2 29  --   --  30 30 32 31  GLUCOSE 168*  --   --  96 93 106* 109*  BUN 41*  --   --  39* 39* 31* 35*  CREATININE 1.23*  --  0.92 0.61 0.72 0.76 0.68  CALCIUM 8.8*  --   --  8.4* 9.0 9.1 8.9  MG 2.3  --   --   --   --   --   --  PHOS 4.5  --   --   --   --   --   --    GFR: Estimated Creatinine Clearance: 46.6 mL/min (by C-G formula based on SCr of 0.68 mg/dL). Liver Function Tests: Recent Labs  Lab 12/22/2022 2313 12/15/22 2114 12/16/22 0352  AST 26 18 26   ALT 30 20 15   ALKPHOS 141* 99 93  BILITOT 0.8 0.6 0.8  PROT 7.1 6.3* 6.0*  ALBUMIN 2.7* 2.3* 2.3*   No results for input(s): "LIPASE", "AMYLASE" in the last 168 hours. No results for input(s): "AMMONIA" in the last 168 hours. Coagulation Profile: No results for input(s): "INR", "PROTIME" in the last 168 hours. Cardiac Enzymes: No results for input(s): "CKTOTAL", "CKMB", "CKMBINDEX", "TROPONINI" in the last 168 hours. BNP (last 3 results) No results for input(s): "PROBNP" in the last 8760 hours. HbA1C: No results for input(s): "HGBA1C" in the last 72 hours. CBG: Recent Labs  Lab 12/14/22 1817 12/15/22 1207 12/15/22 1946 12/15/22 2333 12/16/22 0620  GLUCAP 118* 123* 101* 116* 101*   Lipid  Profile: No results for input(s): "CHOL", "HDL", "LDLCALC", "TRIG", "CHOLHDL", "LDLDIRECT" in the last 72 hours. Thyroid Function Tests: No results for input(s): "TSH", "T4TOTAL", "FREET4", "T3FREE", "THYROIDAB" in the last 72 hours. Anemia Panel: No results for input(s): "VITAMINB12", "FOLATE", "FERRITIN", "TIBC", "IRON", "RETICCTPCT" in the last 72 hours. Sepsis Labs: Recent Labs  Lab 12/12/22 0143  PROCALCITON 0.49  LATICACIDVEN 1.1    Recent Results (from the past 240 hour(s))  SARS Coronavirus 2 by RT PCR (hospital order, performed in [facility] hospital lab) *cepheid single result test* Anterior Nasal Swab     Status: None   Collection Time: 19-Dec-2022 11:13 PM   Specimen: Anterior Nasal Swab  Result Value Ref Range Status   SARS Coronavirus 2 by RT PCR NEGATIVE NEGATIVE Final    Comment: Performed at [facility], [address], [place], [place] [address]  Blood culture (routine x 2)     Status: None (Preliminary result)   Collection Time: 12/12/22  2:28 AM   Specimen: BLOOD  Result Value Ref Range Status   Specimen Description BLOOD BLOOD LEFT ARM  Final   Special Requests   Final    BOTTLES DRAWN AEROBIC AND ANAEROBIC Blood Culture adequate volume   Culture   Final    NO GROWTH 4 DAYS Performed at [facility], [address], [name], [place] [address]    Report Status PENDING  Incomplete  Blood culture (routine x 2)     Status: None (Preliminary result)   Collection Time: 12/12/22  2:28 AM   Specimen: BLOOD  Result Value Ref Range Status   Specimen Description BLOOD BLOOD LEFT ARM  Final   Special Requests   Final    BOTTLES DRAWN AEROBIC AND ANAEROBIC Blood Culture adequate volume   Culture   Final    NO GROWTH 4 DAYS Performed at [facility], [address] [place], [place] [address]    Report Status PENDING  Incomplete  Respiratory (~20 pathogens) panel by PCR     Status: None   Collection Time: 12/12/22  5:12 AM   Specimen:  Nasopharyngeal Swab; Respiratory  Result Value Ref Range Status   Adenovirus NOT DETECTED NOT DETECTED Final   Coronavirus 229E NOT DETECTED NOT DETECTED Final    Comment: (NOTE) The Coronavirus on the Respiratory Panel, DOES NOT test for the novel  Coronavirus (2019 nCoV)    Coronavirus HKU1 NOT DETECTED  NOT DETECTED Final   Coronavirus NL63 NOT DETECTED NOT DETECTED Final   Coronavirus OC43 NOT DETECTED NOT DETECTED Final   Metapneumovirus NOT DETECTED NOT DETECTED Final   Rhinovirus / Enterovirus NOT DETECTED NOT DETECTED Final   Influenza A NOT DETECTED NOT DETECTED Final   Influenza B NOT DETECTED NOT DETECTED Final   Parainfluenza Virus 1 NOT DETECTED NOT DETECTED Final   Parainfluenza Virus 2 NOT DETECTED NOT DETECTED Final   Parainfluenza Virus 3 NOT DETECTED NOT DETECTED Final   Parainfluenza Virus 4 NOT DETECTED NOT DETECTED Final   Respiratory Syncytial Virus NOT DETECTED NOT DETECTED Final   Bordetella pertussis NOT DETECTED NOT DETECTED Final   Bordetella Parapertussis NOT DETECTED NOT DETECTED Final   Chlamydophila pneumoniae NOT DETECTED NOT DETECTED Final   Mycoplasma pneumoniae NOT DETECTED NOT DETECTED Final    Comment: Performed at [facility], [address], [place], [place] [address]  MRSA Next Gen by PCR, Nasal     Status: None   Collection Time: 12/12/22 11:39 AM   Specimen: Nasal Mucosa; Nasal Swab  Result Value Ref Range Status   MRSA by PCR Next Gen NOT DETECTED NOT DETECTED Final    Comment: (NOTE) The GeneXpert MRSA Assay (FDA approved for NASAL specimens only), is one component of a comprehensive MRSA colonization surveillance program. It is not intended to diagnose MRSA infection nor to guide or monitor treatment for MRSA infections. Test performance is not FDA approved in patients less than 2 years old. Performed at [facility], [address], [place], [place] [address]     Radiology Studies: No results found.  Scheduled  Meds:  enoxaparin (LOVENOX) injection  40 mg Subcutaneous Q24H   fluticasone furoate-vilanterol  1 puff Inhalation Daily   furosemide  40 mg Oral BID   hydrALAZINE  25 mg Oral QID   levofloxacin  750 mg Oral Q48H   levothyroxine  50 mcg Oral Q0600   mouth rinse  15 mL Mouth Rinse 4 times per day   sodium chloride flush  3 mL Intravenous Q12H   Continuous Infusions:   LOS: 4 days   Time spent:  Azucena Fallen, DO [facility]  If 7PM-7AM, please contact night-coverage [email or web address]  12/16/2022, 10:03 AM

## 2022-12-16 NOTE — Evaluation (Signed)
Clinical/Bedside Swallow Evaluation Patient Details  Name: [name] MRN: [id number] Date of Birth: Oct 17, 1922  Today's Date: 12/16/2022 Time: SLP Start Time (ACUTE ONLY): 0840 SLP Stop Time (ACUTE ONLY): 0905 SLP Time Calculation (min) (ACUTE ONLY): 25 min  Past Medical History:  Past Medical History:  Diagnosis Date   Atrial fibrillation (HCC)    CARCINOMA, BREAST    CHEST PAIN    DEGENERATIVE JOINT DISEASE    Diastolic heart failure (HCC) 08/03/2008   Qualifier: Diagnosis of  By: Kem Parkinson     Difficult intubation    Edema    GIB (gastrointestinal bleeding) 05/24/2015   Humerus fracture 09/12/2020   HYPERTENSION    Long term (current) use of anticoagulants    Malignant neoplasm of female breast (HCC) 08/03/2008   Qualifier: Diagnosis of  By: Kem Parkinson     Unspecified diastolic heart failure    Past Surgical History:  Past Surgical History:  Procedure Laterality Date   BREAST LUMPECTOMY     ESOPHAGOGASTRODUODENOSCOPY (EGD) WITH PROPOFOL Left 05/28/2015   Procedure: ESOPHAGOGASTRODUODENOSCOPY (EGD) WITH PROPOFOL;  Surgeon: Charlott Rakes, MD;  Location: [facility] ENDOSCOPY;  Service: Endoscopy;  Laterality: Left;   HIP SURGERY     KNEE SURGERY     LAMINECTOMY     ORIF FEMUR FRACTURE Right 08/08/2021   Procedure: OPEN REDUCTION INTERNAL FIXATION (ORIF) DISTAL FEMUR FRACTURE;  Surgeon: Joen Laura, MD;  Location: [facility] ORS;  Service: Orthopedics;  Laterality: Right;   TONSILLECTOMY     HPI:  [name] is a 89 y.o. female who presented to ED with change in mental status, somnolence, and disorientation. Pt wwith hx of chronic hypoxic respiratory failure on 2 L, PAD with dry gangrene involving the right foot, chronic osteomyelitis, chronic recent antibiotic use, bedbound after femur fracture, A-fib, not on anticoagulation, hypertension, hypothyroidism, and is bedbound. CXR on admit, "1. Mild cardiomegaly with mild interstitial edema.  2. Moderate  severity right upper lobe and bilateral basilar  atelectasis and/or infiltrate.  3. Small bilateral pleural effusions." Head CT on admit, "1. No acute intracranial abnormality identified.  2. Stable noncontrast CT appearance of chronic ischemic disease  since last year."    Assessment / Plan / Recommendation  Clinical Impression  Pt seen for clinical swallowing evaluation. Pt alert and confused. Limited verbalizations appreciated which were c/b hypophonic/hoarse vocal quality. Pt unable to follow commands for participation in oral motor exam. Xerostomia appreciated. Pt on 5L/min O2 via [place]. Pt presents with s/sx mild oral dysphagia c/b mildly prolonged mastication and trace oral stasis with solids which cleared with liquid wash. Oral deficits likely impacted by xerstomia and overall mental status.  Pharyngeal swallow appeared [facility] per clinical assessment, although pt is at increased risk for aspiratoin/aspiration PNA given AMS, dependence for feeding at present, and multiple medical comorbidities. Concern for possible esophageal dysphagia given intermittent belching with thin liquids. Recommend initiation of a mech soft diet with thin liquids with safe swallowing strategies/aspiration precautions as outlined below. Concern for pt's ability to meet nutritional needs orally, pt may benefit from RD consult as well as a Palliative Care Consult. SLP to f/u per POC for diet tolerance. SLP to f/u per POC for diet tolerance/trials of upgraded solids pending improvement in mental status and respiratory status.   SLP Visit Diagnosis: Dysphagia, oral phase (R13.11)    Aspiration Risk  Mild aspiration risk;Moderate aspiration risk    Diet Recommendation      Liquid Administration via: Spoon;Cup;Straw Medication Administration: Crushed with  puree Supervision: Staff to assist with self feeding;Full supervision/cueing for compensatory strategies Compensations: Minimize environmental distractions;Slow rate;Small  sips/bites;Follow solids with liquid (rest breaks PRN) Postural Changes: Seated upright at 90 degrees;Remain upright for at least 30 minutes after po intake    Other  Recommendations Recommended Consults:  (RD consult; Palliative Care Consult) Oral Care Recommendations: Oral care QID;Staff/trained caregiver to provide oral care    Recommendations for follow up therapy are one component of a multi-disciplinary discharge planning process, led by the attending physician.  Recommendations may be updated based on patient status, additional functional criteria and insurance authorization.  Follow up Recommendations Follow physician's recommendations for discharge plan and follow up therapies         Functional Status Assessment Patient has had a recent decline in their functional status and demonstrates the ability to make significant improvements in function in a reasonable and predictable amount of time.  Frequency and Duration min 2x/week  2 weeks       Prognosis Prognosis for improved oropharyngeal function: Fair Barriers to Reach Goals: Cognitive deficits;Severity of deficits      Swallow Study   General Date of Onset: 30-Dec-2022 (admit date) HPI: [name] is a 89 y.o. female who presented to ED with change in mental status, somnolence, and disorientation. Pt wwith hx of chronic hypoxic respiratory failure on 2 L, PAD with dry gangrene involving the right foot, chronic osteomyelitis, chronic recent antibiotic use, bedbound after femur fracture, A-fib, not on anticoagulation, hypertension, hypothyroidism, and is bedbound. CXR on admit, "1. Mild cardiomegaly with mild interstitial edema.  2. Moderate severity right upper lobe and bilateral basilar  atelectasis and/or infiltrate.  3. Small bilateral pleural effusions." Head CT on admit, "1. No acute intracranial abnormality identified.  2. Stable noncontrast CT appearance of chronic ischemic disease  since last year." Type of Study:  Bedside Swallow Evaluation Previous Swallow Assessment: none Diet Prior to this Study: Regular;Thin liquids (Level 0) Temperature Spikes Noted: No Respiratory Status: Nasal cannula (5L/min) History of Recent Intubation: No Behavior/Cognition: Alert;Doesn't follow directions Oral Cavity Assessment: Dry Oral Care Completed by SLP: Yes Oral Cavity - Dentition: Adequate natural dentition Vision:  (did not attempt to feed self) Patient Positioning: Upright in bed Baseline Vocal Quality: Low vocal intensity;Hoarse Volitional Cough: Cognitively unable to elicit Volitional Swallow: Unable to elicit    Oral/Motor/Sensory Function Overall Oral Motor/Sensory Function: Other (comment) (unable to assess; no obvious functional deficits)   Ice Chips Ice chips: Not tested   Thin Liquid Thin Liquid: Within functional limits Presentation: Straw Other Comments: ~3 oz water via single and consecutive straw sips    Nectar Thick Nectar Thick Liquid: Not tested   Honey Thick Honey Thick Liquid: Not tested   Puree Puree: Within functional limits Presentation: Spoon Other Comments: x1 tsp   Solid     Solid: Impaired Oral Phase Impairments: Impaired mastication Oral Phase Functional Implications: Impaired mastication (trace lingual stasis; cleared with liquid wash) Pharyngeal Phase Impairments:  (appeared WFL)     Clyde Canterbury, M.S., CCC-SLP Speech-Language Pathologist [facility] [phone] (ASCOM)  Alessandra Bevels Haig Gerardo 12/16/2022,9:23 AM

## 2022-12-16 NOTE — Progress Notes (Signed)
Pt is alert and confused. She is oriented to persons and place. SPO2 87-88% on 3 LPM of O2 NCL, with short and shallow breathing, RR 28-30, breathe sounds are diminished on lower lung base bilaterally on auscultations.   We increased O2 to 5 LPM with humidifier and able to maintain her SPO2 above 92% at night. Deep breathing exercise is encouraged. Chest x-ray on 12/11/22 had moderate severity right upper lobe and bilateral basilar atelectasis and/or infiltrate with small bilateral pleural effusions. MD is aware. Pt has been getting Lasix 40 mg BID. She is stable hemodynamically, Afib/Aflutter on the monitor, HR is under controlled.    Pt has been lethargic, poor oral intake, and increased risk of aspiration per a family member reported that Pt had shown signs of difficulties to have oral intake today. She coughed after the family member tried to feed her milk with a strew today.  Later a family member tried to feed her milk and water with a teaspoon with head of bed up hight above 40 degrees. Pt had less motivations to drink or eat. However she was able to have small sips of thin liquid with no coughing.   Risk of aspiration was discussed with family member. We recommended SLP for further evaluation. We will continue to monitor and hand off to a morning team.   Filiberto Pinks, RN

## 2022-12-17 ENCOUNTER — Encounter (HOSPITAL_COMMUNITY): Payer: Self-pay | Admitting: Pulmonary Disease

## 2022-12-17 DIAGNOSIS — Z515 Encounter for palliative care: Secondary | ICD-10-CM | POA: Diagnosis not present

## 2022-12-17 DIAGNOSIS — J9601 Acute respiratory failure with hypoxia: Secondary | ICD-10-CM | POA: Diagnosis not present

## 2022-12-17 DIAGNOSIS — J690 Pneumonitis due to inhalation of food and vomit: Secondary | ICD-10-CM | POA: Diagnosis not present

## 2022-12-17 LAB — COMPREHENSIVE METABOLIC PANEL
ALT: 17 U/L (ref 0–44)
AST: 17 U/L (ref 15–41)
Albumin: 2.5 g/dL — ABNORMAL LOW (ref 3.5–5.0)
Alkaline Phosphatase: 96 U/L (ref 38–126)
Anion gap: 13 (ref 5–15)
BUN: 32 mg/dL — ABNORMAL HIGH (ref 8–23)
CO2: 34 mmol/L — ABNORMAL HIGH (ref 22–32)
Calcium: 9.6 mg/dL (ref 8.9–10.3)
Chloride: 105 mmol/L (ref 98–111)
Creatinine, Ser: 0.66 mg/dL (ref 0.44–1.00)
GFR, Estimated: >60 mL/min (ref >60)
Glucose, Bld: 90 mg/dL (ref 70–99)
Potassium: 3.4 mmol/L — ABNORMAL LOW (ref 3.5–5.1)
Sodium: 152 mmol/L — ABNORMAL HIGH (ref 135–145)
Total Bilirubin: 0.5 mg/dL (ref 0.3–1.2)
Total Protein: 6.6 g/dL (ref 6.5–8.1)

## 2022-12-17 LAB — CBC
HCT: 39 % (ref 36.0–46.0)
Hemoglobin: 11.5 g/dL — ABNORMAL LOW (ref 12.0–15.0)
MCH: 28.8 pg (ref 26.0–34.0)
MCHC: 29.5 g/dL — ABNORMAL LOW (ref 30.0–36.0)
MCV: 97.7 fL (ref 80.0–100.0)
Platelets: 357 10*3/uL (ref 150–400)
RBC: 3.99 MIL/uL (ref 3.87–5.11)
RDW: 14.7 % (ref 11.5–15.5)
WBC: 8 10*3/uL (ref 4.0–10.5)
nRBC: 0 % (ref 0.0–0.2)

## 2022-12-17 LAB — CULTURE, BLOOD (ROUTINE X 2)
Culture: NO GROWTH
Culture: NO GROWTH
Special Requests: ADEQUATE
Special Requests: ADEQUATE

## 2022-12-17 LAB — GLUCOSE, CAPILLARY: Glucose-Capillary: 96 mg/dL (ref 70–99)

## 2022-12-17 MED ORDER — SODIUM CHLORIDE 0.9 % IV SOLN: INTRAVENOUS | Status: AC

## 2022-12-17 MED ORDER — FUROSEMIDE 10 MG/ML IJ SOLN
20.0000 mg | Freq: Once | INTRAMUSCULAR | Status: AC
  Administered 2022-12-18: 20 mg via INTRAVENOUS
  Filled 2022-12-17: qty 2

## 2022-12-17 MED ORDER — BISACODYL 10 MG RE SUPP
10.0000 mg | Freq: Once | RECTAL | Status: AC
  Administered 2022-12-17: 10 mg via RECTAL
  Filled 2022-12-17: qty 1

## 2022-12-17 MED ORDER — SODIUM CHLORIDE 0.9 % IV SOLN
1.0000 g | Freq: Once | INTRAVENOUS | Status: AC
  Administered 2022-12-17: 1 g via INTRAVENOUS
  Filled 2022-12-17: qty 10

## 2022-12-17 NOTE — Progress Notes (Addendum)
Pt has been more drowsy, lethargic, no eyes opening, not interactive, withdrawn from pain stimuli, none verbal, unable to receive oral medications and any oral intakes.   Her hemodynamics stable, A-flutter on the monitor, HR 60s-70s, on 5 LPM of O2 NCL, normal respiratory efforts. SPO2 93-97%. Per her current code status is DNR with interventions desired. We will continue to monitor.   Pt's health care power of attorney, Ms. Deveron Furlong and Pt's nephew, Mr. Doniqua Saxby visited and expressed their concern that Pt has not had bowel movement since 12/13/22. Normally she has BM every day. Pt is unable to take oral laxative. We will hand off the day shift team for further bowel interventions. Pt has has not had urine output in the past 12 hours via [employer].   Filiberto Pinks, RN

## 2022-12-17 NOTE — Progress Notes (Addendum)
Was notified that Deveron Furlong, one of the patients' HCPOA, had concerns about pt care that she wanted addressed ASAP.   She is very concerned that the patient is not currently receiving IVF, nutrition, antibiotics, or Lasix. We had a long discussion about the patient's current status and the potential role of the therapies she mentioned.   Ildiko Tissot (shared HCPOA) echoed Ledon Snare concerns, and asked that the patient be given IVF, antibiotics, and Lasix. They both agree that the patient would not want to be intubated or receive ACLS (current code status order allows for this).   Based on our discussion, code status was updated to DNR/DNI.

## 2022-12-17 NOTE — Consult Note (Signed)
Consultation Note Date: 12/17/2022   Patient Name: [name]  DOB: 30-Nov-1922  MRN: [id number]  Age / Sex: 89 y.o., female  PCP: Eloisa Northern, MD Referring Physician: Azucena Fallen, MD  Reason for Consultation:  end of life care/discussion  HPI/Patient Profile: 89 y.o. female  with past medical history of a fib, bedbound, chronic osteomyelitis with gangrene of R heel- amputation was recommended, but she declined admitted on 12/01/2022 with acute hypoxic respiratory failure due to aspiration pneumonia, COPD, decompensated HF, RLE osteomyelitis. SLP eval with high risk for continue aspiration. Palliative medicine consulted for end of life care/discussion.    Primary Decision Maker HCPOA - Jaslynne Wyka, second HCPOA Deveron Furlong  Discussion: Chart reviewed including labs, progress notes, imaging from this and previous encounters.  Evaluated patient. She was not responsive to my voice or touch. Using accessory muscles for breathing. Appears to be active dying.  Spoke with her HCPOA- nephew- Varetta Browell.  Expressed concern that Jamiracle is dying and is not dying comfortably.  Recommended transition to full comfort measures only- Discussed transition to comfort measures only which includes stopping IV fluids, antibiotics, labs and providing symptom management for SOB, anxiety, nausea, vomiting, and other symptoms of dying.  Christiane Ha agrees with comfort measures, but needs to discuss with Jan.      SUMMARY OF RECOMMENDATIONS -Christiane Ha to call PMT or RN station re: wishes to transition to comfort measures after he speaks with Jan.  -Recommend following comfort interventions :  Transition to full comfort measures 2mg  IV morphine q4hrs 2mg  IV morphine q15 min prn SOB, air hunger Lorazepam 1mg   IV q4 hr anxiety Haldol .5mg  po, SL or IV q4 hr PRN agitation Glycopyrrolate .2mg  IV q4hr  secretions D/C IV fluids, medications, and other medications that are not intended comfort Anticipate hospital death in hours-days once transitioned to full comfort- would not recommend transfer out of facility     Code Status/Advance Care Planning: DNR   Prognosis:   Hours - Days  Discharge Planning: To Be Determined  Primary Diagnoses: Present on Admission:  Acute hypoxic respiratory failure (HCC)  Acute respiratory failure with hypoxemia (HCC)   Review of Systems  Unable to perform ROS: Acuity of condition    Physical Exam Pulmonary:     Comments: Incresed effort and rate, +accessory muscles Musculoskeletal:     Comments: Diffuse anasarca  Psychiatric:     Comments: unresponsive     Vital Signs: BP (!) 140/51 (BP Location: Left Arm)   Pulse 70   Temp 98.3 F (36.8 C) (Oral)   Resp 20   Ht 5\' 8"  (1.727 m)   Wt 87.8 kg   SpO2 97%   BMI 29.43 kg/m  Pain Scale: Faces   Pain Score: Asleep   SpO2: SpO2: 97 % O2 Device:SpO2: 97 % O2 Flow Rate: .O2 Flow Rate (L/min): 5 L/min  IO: Intake/output summary:  Intake/Output Summary (Last 24 hours) at 12/17/2022 1518 Last data filed at 12/17/2022 0444 Gross per 24 hour  Intake 0 ml  Output 300 ml  Net -300 ml    LBM: Last BM Date : 12/13/22 Baseline Weight: Weight: 86.1 kg Most recent weight: Weight: 87.8 kg       Thank you for this consult. Palliative medicine will continue to follow and assist as needed.   Signed by: Ocie Bob, AGNP-C Palliative Medicine    Please contact Palliative Medicine Team phone at [phone] for questions and concerns.  For individual provider: See Loretha Stapler

## 2022-12-17 NOTE — TOC Progression Note (Signed)
Transition of Care [facility]) - Progression Note    Patient Details  Name: [name] MRN: [id number] Date of Birth: April 04, 1922  Transition of Care [facility]) CM/SW Contact  Eduard Roux, [place] Phone Number: 12/17/2022, 2:17 PM  Clinical Narrative:     TOC continues to follow    Antony Blackbird, MSW, LCSW Clinical Social Worker         Expected Discharge Plan and Services                                               Social Determinants of Health (SDOH) Interventions SDOH Screenings   Food Insecurity: No Food Insecurity (12/17/2022)  Housing: High Risk (12/17/2022)  Transportation Needs: No Transportation Needs (12/17/2022)  Utilities: Patient Unable To Answer (12/17/2022)  Tobacco Use: Low Risk  (12/17/2022)    Readmission Risk Interventions    08/11/2021   12:14 PM  Readmission Risk Prevention Plan  Transportation Screening Complete  PCP or Specialist Appt within 3-5 Days Complete  HRI or Home Care Consult Complete  Social Work Consult for Recovery Care Planning/Counseling Complete  Palliative Care Screening Not Applicable  Medication Review Oceanographer) Complete

## 2022-12-17 NOTE — Progress Notes (Signed)
Patient in bed with eyes closed, equal chest rise and fall. No c/o pain patient continues to be lethargic and non interactive this shift. Bisacodyl suppository given to help treat sever constipation.

## 2022-12-17 NOTE — Progress Notes (Addendum)
PROGRESS NOTE    [name]  HYQ:[id number] DOB: [birth year] DOA: 12-15-22 PCP: Eloisa Northern, MD   Brief Narrative:  4 white female known history of atrial fibrillation CHADVASC >3 hasbled >3 not on anticoagulation (UGIB EGD 05/28/2015 antral ulcers) intolerant to beta-blocker secondary to bradycardia; Chronically bedbound needs lift; breast cancer right lumpectomy XRT 2002-previously followed at [facility] last seen 2017; prior right humeral fracture in 2022.  Patient admitted 2022-12-15 with dyspnea exertion and profound hypoxia.  Sats in the 80s, does not wear oxygen at baseline, EKG shows 3-1 flutter, initially placed on BiPAP for respiratory support.  Patient's baseline is functionally alert, bedbound, able to assist with finances and other complex tasks but relies on others for physical needs and mobility -she is not on oxygen at baseline.   Recent admission 3/28 through 06/05/2022 chronic heel ulcer not a surgical candidate (right femoral fracture 2023) patient was recommended AKA at that time. MRI showed early osteo patient was treated with oral Vantin Doxy X 8 weeks-patient adamantly refused surgery despite osteo on MRI. Follows with wound care [facility] for the wound   Assessment & Plan:   Principal Problem:   Acute hypoxic respiratory failure (HCC) Active Problems:   Acute respiratory failure with hypoxemia (HCC)  Goals of care discussion -Lengthy discussion with patient's family today both at bedside and over the phone -Patient continues to deteriorate, less responsive today, unable to take p.o. or interact in any meaningful way.  As previously discussed goals for patient were to focus on comfort and quality of life. -Palliative care consulted for assistance with further planning -Family to discuss CODE STATUS further - we discussed that intubating the patient at this point would only prolong her suffering and would not add any meaningful or quality time with  family  **Afternoon update: Palliative care discussed with power of attorney/family -transition to comfort measures.  Anticipate hospital death, if patient stabilizes family did discuss hospice at home as an alternative.  Acute hypoxic respiratory failure 2/2 aspiration PNA COPD, not in acute decompensation  BiPAP discontinued 10/13 Continue Levaquin Speech continues to follow, high risk for aspiration despite coaching/education Patient wishes to continue with p.o. intake despite risks   Decompensated HFpEF  Medication choices limited by hypotension, continue hydralazine ACE/ARB on hold, discontinue Lasix - urine output essentially nil over the past 12h  Chronic failure COPD Not in acute exacerbation, continue Breo Ellipta; DuoNeb q6h prn   Chronic osteomyelitis right lower extremity with dry gangrene superimposed on peripheral vascular disease Previously refused above-knee amputation--no operative management currently as not wet gangrene Wounds appear unsalvageable with ulcerations -wound care has been following, continue Betadine, Xeroform/ABD/Kerlix -Family reports patient was chronically on Levaquin -can continue inpatient   Hypothyroidism Continue Synthroid 50 daily  Atrial fibrillation CHADVASC >3 not on anticoagulation No anticoagulation in the setting of bleeding history  Rate controlled  DVT prophylaxis: enoxaparin (LOVENOX) injection 40 mg Start: 12/12/22 1600 SCDs Start: 12/12/22 0804   Code Status:   Code Status: Do not attempt resuscitation (DNR) PRE-ARREST INTERVENTIONS DESIRED - NO CPR (Intubate only per discussion at bedside with family and patient 12/16/22) Family to discuss DNR given ongoing decompensation.  Family Communication: At bedside+updated over the phone  Status is: Inpatient  Dispo: The patient is from: Facility              Anticipated d/c is to: ? Home with hospice              Anticipated  d/c date is: 24 to 48 hours              Patient  currently not medically stable for discharge  Consultants:  Palliative  Procedures:  None  Antimicrobials:  Levaquin  Subjective: No acute issues or events overnight, patient continues to deteriorate no longer interactive in any meaningful way today with family or staff.  Unable to tolerate p.o. safely, request by family to discontinue labs/other painful or uncomfortable procedures is certainly reasonable  Objective: Vitals:   12/16/22 2249 12/17/22 0000 12/17/22 0403 12/17/22 0409  BP: (!) 154/57  (!) 132/48   Pulse: 63 63 64   Resp: (!) 27 (!) 21 (!) 23   Temp: 98.2 F (36.8 C)  97.8 F (36.6 C)   TempSrc: Axillary  Axillary   SpO2: 95% 99% 98%   Weight:    87.8 kg  Height:        Intake/Output Summary (Last 24 hours) at 12/17/2022 0735 Last data filed at 12/17/2022 0444 Gross per 24 hour  Intake 0 ml  Output 300 ml  Net -300 ml   Filed Weights   12/14/22 0345 12/16/22 0623 12/17/22 0409  Weight: 92.5 kg 87.8 kg 87.8 kg    Examination:  General: Sitting bed comfortably, no acute distress, poorly responsive, withdraws to pain Neck:  Without mass or deformity.  Trachea is midline. Lungs: Diffuse rhonchi, tachypneic shallow breathing without overt rales. [name]:  Regular rate and rhythm.  Without murmurs, rubs, or gallops. Abdomen:  Soft, nontender, nondistended.   Extremities: Chronic bilateral lower extremity wounds  Data Reviewed: I have personally reviewed following labs and imaging studies  CBC: Recent Labs  Lab 12/29/2022 2313 12/12/22 0212 12/12/22 1247 12/13/22 0304 12/14/22 0402 12/15/22 2114 12/16/22 0352  WBC 18.7*   < > 11.3* 10.9* 9.0 8.2 8.3  NEUTROABS 16.5*  --   --   --   --   --   --   HGB 11.3*   < > 10.8* 9.6* 10.8* 10.5* 10.3*  HCT 37.3   < > 35.1* 31.4* 35.5* 34.3* 33.4*  MCV 95.4   < > 97.0 95.7 96.5 92.7 93.3  PLT 382   < > 345 321 368 354 316   < > = values in this interval not displayed.   Basic Metabolic Panel: Recent Labs   Lab 12/12/22 0625 12/12/22 0703 12/12/22 1247 12/13/22 0304 12/14/22 0402 12/15/22 2114 12/16/22 0352  NA 140 139  --  143 146* 148* 148*  K 4.7 4.7  --  3.6 3.5 3.3* 3.7  CL 100  --   --  103 104 106 105  CO2 29  --   --  30 30 32 31  GLUCOSE 168*  --   --  96 93 106* 109*  BUN 41*  --   --  39* 39* 31* 35*  CREATININE 1.23*  --  0.92 0.61 0.72 0.76 0.68  CALCIUM 8.8*  --   --  8.4* 9.0 9.1 8.9  MG 2.3  --   --   --   --   --   --   PHOS 4.5  --   --   --   --   --   --    GFR: Estimated Creatinine Clearance: 46.6 mL/min (by C-G formula based on SCr of 0.68 mg/dL). Liver Function Tests: Recent Labs  Lab 12/16/2022 2313 12/15/22 2114 12/16/22 0352  AST 26 18 26   ALT 30 20 15   ALKPHOS  141* 99 93  BILITOT 0.8 0.6 0.8  PROT 7.1 6.3* 6.0*  ALBUMIN 2.7* 2.3* 2.3*   CBG: Recent Labs  Lab 12/16/22 0620 12/16/22 1106 12/16/22 1829 12/16/22 2323 12/17/22 0559  GLUCAP 101* 116* 108* 100* 96   Sepsis Labs: Recent Labs  Lab 12/12/22 0143  PROCALCITON 0.49  LATICACIDVEN 1.1    Recent Results (from the past 240 hour(s))  SARS Coronavirus 2 by RT PCR (hospital order, performed in [facility] hospital lab) *cepheid single result test* Anterior Nasal Swab     Status: None   Collection Time: 01-09-2023 11:13 PM   Specimen: Anterior Nasal Swab  Result Value Ref Range Status   SARS Coronavirus 2 by RT PCR NEGATIVE NEGATIVE Final    Comment: Performed at [facility], [address] [place], [place] [address]  Blood culture (routine x 2)     Status: None (Preliminary result)   Collection Time: 12/12/22  2:28 AM   Specimen: BLOOD  Result Value Ref Range Status   Specimen Description BLOOD BLOOD LEFT ARM  Final   Special Requests   Final    BOTTLES DRAWN AEROBIC AND ANAEROBIC Blood Culture adequate volume   Culture   Final    NO GROWTH 4 DAYS Performed at [facility], [address], [place], [place] [address]    Report Status PENDING  Incomplete  Blood  culture (routine x 2)     Status: None (Preliminary result)   Collection Time: 12/12/22  2:28 AM   Specimen: BLOOD  Result Value Ref Range Status   Specimen Description BLOOD BLOOD LEFT ARM  Final   Special Requests   Final    BOTTLES DRAWN AEROBIC AND ANAEROBIC Blood Culture adequate volume   Culture   Final    NO GROWTH 4 DAYS Performed at [facility], [address], [place], [place] [address]    Report Status PENDING  Incomplete  Respiratory (~20 pathogens) panel by PCR     Status: None   Collection Time: 12/12/22  5:12 AM   Specimen: Nasopharyngeal Swab; Respiratory  Result Value Ref Range Status   Adenovirus NOT DETECTED NOT DETECTED Final   Coronavirus 229E NOT DETECTED NOT DETECTED Final    Comment: (NOTE) The Coronavirus on the Respiratory Panel, DOES NOT test for the novel  Coronavirus (2019 nCoV)    Coronavirus HKU1 NOT DETECTED NOT DETECTED Final   Coronavirus NL63 NOT DETECTED NOT DETECTED Final   Coronavirus OC43 NOT DETECTED NOT DETECTED Final   Metapneumovirus NOT DETECTED NOT DETECTED Final   Rhinovirus / Enterovirus NOT DETECTED NOT DETECTED Final   Influenza A NOT DETECTED NOT DETECTED Final   Influenza B NOT DETECTED NOT DETECTED Final   Parainfluenza Virus 1 NOT DETECTED NOT DETECTED Final   Parainfluenza Virus 2 NOT DETECTED NOT DETECTED Final   Parainfluenza Virus 3 NOT DETECTED NOT DETECTED Final   Parainfluenza Virus 4 NOT DETECTED NOT DETECTED Final   Respiratory Syncytial Virus NOT DETECTED NOT DETECTED Final   Bordetella pertussis NOT DETECTED NOT DETECTED Final   Bordetella Parapertussis NOT DETECTED NOT DETECTED Final   Chlamydophila pneumoniae NOT DETECTED NOT DETECTED Final   Mycoplasma pneumoniae NOT DETECTED NOT DETECTED Final    Comment: Performed at [facility], [address] [place], [place] [address]  MRSA Next Gen by PCR, Nasal     Status: None   Collection Time: 12/12/22 11:39 AM   Specimen: Nasal Mucosa; Nasal Swab  Result Value Ref Range Status   MRSA by PCR Next Gen NOT DETECTED NOT DETECTED Final    Comment: (NOTE) The GeneXpert MRSA Assay (FDA approved for NASAL specimens only), is one component of a comprehensive MRSA colonization surveillance program. It is not intended to diagnose MRSA infection nor to guide or monitor treatment for MRSA infections. Test performance is not FDA approved in patients less than 67 years old. Performed at [facility], [address] [place], [place] [address]     Radiology Studies: DG CHEST PORT 1 VIEW  Result Date: 12/16/2022 CLINICAL DATA:  Pneumonia. EXAM: PORTABLE CHEST 1 VIEW COMPARISON:  Chest x-ray 2024/12/2722. FINDINGS: Similar cardiomegaly and interstitial prominence with patchy opacities. Retrocardiac opacity. No visible pneumothorax. Probable small bilateral pleural effusions. No acute bony abnormality. Polyarticular degenerative change. IMPRESSION: 1. Similar cardiomegaly and probable mild interstitial pulmonary edema. Retrocardiac opacity could represent atelectasis or pneumonia. 2. Similar probable small bilateral pleural effusions. Electronically Signed   By: Feliberto Harts M.D.   On: 12/16/2022 11:31    Scheduled Meds:  enoxaparin (LOVENOX) injection  40 mg Subcutaneous Q24H   fluticasone furoate-vilanterol  1 puff Inhalation Daily   furosemide  40 mg Oral BID   hydrALAZINE  25 mg Oral QID   levofloxacin  750 mg Oral Q48H   levothyroxine  50 mcg Oral Q0600   mouth rinse  15 mL Mouth Rinse 4 times per day   polyethylene glycol  17 g Oral BID   senna-docusate  1 tablet Oral BID   sodium chloride flush  3 mL Intravenous Q12H   Continuous Infusions:   LOS: 5 days   Time spent:  Azucena Fallen, DO [facility]  If 7PM-7AM, please contact night-coverage [email or web address]  12/17/2022, 7:35 AM

## 2022-12-18 DIAGNOSIS — Z7189 Other specified counseling: Secondary | ICD-10-CM | POA: Diagnosis not present

## 2022-12-18 DIAGNOSIS — J9601 Acute respiratory failure with hypoxia: Secondary | ICD-10-CM | POA: Diagnosis not present

## 2022-12-18 DIAGNOSIS — Z515 Encounter for palliative care: Secondary | ICD-10-CM | POA: Diagnosis not present

## 2022-12-18 MED ORDER — LORAZEPAM 2 MG/ML PO CONC: 1.0000 mg | ORAL | Status: DC | PRN

## 2022-12-18 MED ORDER — GLYCOPYRROLATE 0.2 MG/ML IJ SOLN
0.1000 mg | Freq: Four times a day (QID) | INTRAMUSCULAR | Status: DC | PRN
  Administered 2022-12-19: 0.1 mg via ORAL
  Filled 2022-12-18: qty 1

## 2022-12-18 MED ORDER — MORPHINE SULFATE (PF) 2 MG/ML IV SOLN: 1.0000 mg | INTRAVENOUS | Status: DC | PRN

## 2022-12-18 NOTE — Progress Notes (Signed)
This nurse in to assess patient vital signs as per flowsheet BP soft and educated patient family on fluid and nutrition questions patient POA's concerned that she needs fluid and nutrition to possibly improve her condition. Palliative care to come round on patient today   12/18/22 0802  Vitals  Temp (!) 97.5 F (36.4 C)  Temp Source Oral  BP (!) 80/31  MAP (mmHg) (!) 47  BP Location Left Arm  BP Method Automatic  Patient Position (if appropriate) Lying  Pulse Rate (!) 56  Pulse Rate Source Monitor  ECG Heart Rate 63  Resp 18  MEWS COLOR  MEWS Score Color Red  Oxygen Therapy  SpO2 93 %  O2 Device Nasal Cannula  MEWS Score  MEWS Temp 0  MEWS Systolic 2  MEWS Pulse 0  MEWS RR 0  MEWS LOC 3  MEWS Score 5

## 2022-12-18 NOTE — Progress Notes (Signed)
Jan and Almyra Brace would like to speak with M.D. about plan of care for this patient. . Dr. Antionette Char paged at 21:17 and call return. Dr. Antionette Char  spoke with Jan on the phone.Marland Kitchen

## 2022-12-18 NOTE — Progress Notes (Signed)
This am received report from off going nurse and family at bedside asking about kidney function since no output was observed overnight.  This nurse explained current orders to not obtain lab work for comfort of patient. Family appreciative of care awaiting provider and co HCPOA to make decision related to comfort measures at this time. Call light in reach with the bed in lowest position with safety ensured. Current plan of care in progress

## 2022-12-18 NOTE — Progress Notes (Signed)
PROGRESS NOTE    [name]  ZOX:[id number] DOB: 04-Apr-1922 DOA: 12/04/2022 PCP: Eloisa Northern, MD   Brief Narrative:  55 white female known history of atrial fibrillation CHADVASC >3 hasbled >3 not on anticoagulation (UGIB EGD 05/28/2015 antral ulcers) intolerant to beta-blocker secondary to bradycardia; Chronically bedbound needs lift; breast cancer right lumpectomy XRT 2002-previously followed at [facility] last seen 2017; prior right humeral fracture in 2022.  Patient admitted 12/24/2022 with dyspnea exertion and profound hypoxia.  Sats in the 80s, does not wear oxygen at baseline, EKG shows 3-1 flutter, initially placed on BiPAP for respiratory support.  Patient's baseline is functionally alert, bedbound, able to assist with finances and other complex tasks but relies on others for physical needs and mobility -she is not on oxygen at baseline.   Recent admission 3/28 through 06/05/2022 chronic heel ulcer not a surgical candidate (right femoral fracture 2023) patient was recommended AKA at that time. MRI showed early osteo patient was treated with oral Vantin Doxy X 8 weeks-patient adamantly refused surgery despite osteo on MRI. Follows with [facility] care [facility] PA Durene Cal for the wound   Assessment & Plan:   Principal Problem:   Acute hypoxic respiratory failure (HCC) Active Problems:   Acute respiratory failure with hypoxemia (HCC)  Goals of care discussion -Patient continues as DNR per family discussion -Lengthy discussion again this morning about goals of care, will continue fluids in the interim, Foley placed given likely urinary obstruction and retention -Morphine and Ativan glycopyrrolate ordered as needed per comfort measures order set -Expect in-hospital death  Acute hypoxic respiratory failure 2/2 aspiration PNA COPD, not in acute decompensation  BiPAP discontinued 10/13 Completed antibiotics   Decompensated HFpEF  Chronic failure COPD Chronic 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  DVT prophylaxis: SCDs Start: 12/12/22 0804 Code Status:   Code Status: Limited: Do not attempt resuscitation (DNR) -DNR-LIMITED -Do Not Intubate/DNI   Family Communication: At bedside  Status is: Inpatient  Dispo: The patient is from: Facility              Anticipated d/c is to: Anticipate in-hospital death              Anticipated d/c date is: 24 to 48 hours              Patient currently not medically stable for discharge  Consultants:  Palliative  Procedures:  None  Antimicrobials:  None  Subjective: Confusion overnight about patient's CODE STATUS and care, lengthy discussion this morning as above, essentially comfort measures is to move forward with supportive care, IV fluids.  Review of systems unable to be obtained  Objective: Vitals:   12/18/22 0612 12/18/22 0802 12/18/22 0900 12/18/22 1205  BP:  (!) 80/31 (!) 86/37 (!) 79/52  Pulse:  (!) 56 69 (!) 45  Resp:  18 (!) 8 11  Temp:  (!) 97.5 F (36.4 C)    TempSrc:  Oral    SpO2:  93% 94% 94%  Weight: 85.2 kg     Height:        Intake/Output Summary (Last 24 hours) at 12/18/2022 1328 Last data filed at 12/18/2022 1139 Gross per 24 hour  Intake 273 ml  Output 1000 ml  Net -727 ml  Filed Weights   12/16/22 0623 12/17/22 0409 12/18/22 0612  Weight: 87.8 kg 87.8 kg 85.2 kg    Examination:  General: Sitting bed comfortably, no acute distress, poorly responsive, withdraws to pain Neck:  Without mass or deformity.  Trachea is midline. Lungs: Diffuse rhonchi, tachypneic shallow breathing without overt rales. Heart:  Regular rate and rhythm.  Without murmurs, rubs,  or gallops. Abdomen:  Soft, nontender, nondistended.   Extremities: Chronic bilateral lower extremity wounds  Data Reviewed: I have personally reviewed following labs and imaging studies  CBC: Recent Labs  Lab 12/12/2022 2313 12/12/22 0212 12/13/22 0304 12/14/22 0402 12/15/22 2114 12/16/22 0352 12/17/22 0724  WBC 18.7*   < > 10.9* 9.0 8.2 8.3 8.0  NEUTROABS 16.5*  --   --   --   --   --   --   HGB 11.3*   < > 9.6* 10.8* 10.5* 10.3* 11.5*  HCT 37.3   < > 31.4* 35.5* 34.3* 33.4* 39.0  MCV 95.4   < > 95.7 96.5 92.7 93.3 97.7  PLT 382   < > 321 368 354 316 357   < > = values in this interval not displayed.   Basic Metabolic Panel: Recent Labs  Lab 12/12/22 0625 12/12/22 0703 12/13/22 0304 12/14/22 0402 12/15/22 2114 12/16/22 0352 12/17/22 0724  NA 140   < > 143 146* 148* 148* 152*  K 4.7   < > 3.6 3.5 3.3* 3.7 3.4*  CL 100  --  103 104 106 105 105  CO2 29  --  30 30 32 31 34*  GLUCOSE 168*  --  96 93 106* 109* 90  BUN 41*  --  39* 39* 31* 35* 32*  CREATININE 1.23*   < > 0.61 0.72 0.76 0.68 0.66  CALCIUM 8.8*  --  8.4* 9.0 9.1 8.9 9.6  MG 2.3  --   --   --   --   --   --   PHOS 4.5  --   --   --   --   --   --    < > = values in this interval not displayed.   GFR: Estimated Creatinine Clearance: 45.9 mL/min (by C-G formula based on SCr of 0.66 mg/dL). Liver Function Tests: Recent Labs  Lab 12/06/2022 2313 12/15/22 2114 12/16/22 0352 12/17/22 0724  AST 26 18 26 17   ALT 30 20 15 17   ALKPHOS 141* 99 93 96  BILITOT 0.8 0.6 0.8 0.5  PROT 7.1 6.3* 6.0* 6.6  ALBUMIN 2.7* 2.3* 2.3* 2.5*   CBG: Recent Labs  Lab 12/16/22 0620 12/16/22 1106 12/16/22 1829 12/16/22 2323 12/17/22 0559  GLUCAP 101* 116* 108* 100* 96   Sepsis Labs: Recent Labs  Lab 12/12/22 0143  PROCALCITON 0.49  LATICACIDVEN 1.1    Recent Results (from the past 240 hour(s))  SARS Coronavirus 2 by RT PCR (hospital order, performed in [facility] hospital lab) *cepheid single result test*  Anterior Nasal Swab     Status: None   Collection Time: 12/21/2022 11:13 PM   Specimen: Anterior Nasal Swab  Result Value Ref Range Status   SARS Coronavirus 2 by RT PCR NEGATIVE NEGATIVE Final    Comment: Performed at [facility] Lab, [address], [place], [place] [address]  Blood culture (routine x 2)     Status: None   Collection Time: 12/12/22  2:28 AM   Specimen: BLOOD  Result Value Ref Range Status   Specimen Description BLOOD  BLOOD LEFT ARM  Final   Special Requests   Final    BOTTLES DRAWN AEROBIC AND ANAEROBIC Blood Culture adequate volume   Culture   Final    NO GROWTH 5 DAYS Performed at [facility], [address], [place], [place] [address]    Report Status 12/17/2022 FINAL  Final  Blood culture (routine x 2)     Status: None   Collection Time: 12/12/22  2:28 AM   Specimen: BLOOD  Result Value Ref Range Status   Specimen Description BLOOD BLOOD LEFT ARM  Final   Special Requests   Final    BOTTLES DRAWN AEROBIC AND ANAEROBIC Blood Culture adequate volume   Culture   Final    NO GROWTH 5 DAYS Performed at [facility], [address] [place], [place] [address]    Report Status 12/17/2022 FINAL  Final  Respiratory (~20 pathogens) panel by PCR     Status: None   Collection Time: 12/12/22  5:12 AM   Specimen: Nasopharyngeal Swab; Respiratory  Result Value Ref Range Status   Adenovirus NOT DETECTED NOT DETECTED Final   Coronavirus 229E NOT DETECTED NOT DETECTED Final    Comment: (NOTE) The Coronavirus on the Respiratory Panel, DOES NOT test for the novel  Coronavirus (2019 nCoV)    Coronavirus HKU1 NOT DETECTED NOT DETECTED Final   Coronavirus NL63 NOT DETECTED NOT DETECTED Final   Coronavirus OC43 NOT DETECTED NOT DETECTED Final   Metapneumovirus NOT DETECTED NOT DETECTED Final   Rhinovirus / Enterovirus NOT DETECTED NOT DETECTED Final   Influenza A NOT DETECTED NOT DETECTED Final   Influenza B NOT DETECTED NOT DETECTED Final   Parainfluenza  Virus 1 NOT DETECTED NOT DETECTED Final   Parainfluenza Virus 2 NOT DETECTED NOT DETECTED Final   Parainfluenza Virus 3 NOT DETECTED NOT DETECTED Final   Parainfluenza Virus 4 NOT DETECTED NOT DETECTED Final   Respiratory Syncytial Virus NOT DETECTED NOT DETECTED Final   Bordetella pertussis NOT DETECTED NOT DETECTED Final   Bordetella Parapertussis NOT DETECTED NOT DETECTED Final   Chlamydophila pneumoniae NOT DETECTED NOT DETECTED Final   Mycoplasma pneumoniae NOT DETECTED NOT DETECTED Final    Comment: Performed at [facility], [address], [place], [place] [address]  MRSA Next Gen by PCR, Nasal     Status: None   Collection Time: 12/12/22 11:39 AM   Specimen: Nasal Mucosa; Nasal Swab  Result Value Ref Range Status   MRSA by PCR Next Gen NOT DETECTED NOT DETECTED Final    Comment: (NOTE) The GeneXpert MRSA Assay (FDA approved for NASAL specimens only), is one component of a comprehensive MRSA colonization surveillance program. It is not intended to diagnose MRSA infection nor to guide or monitor treatment for MRSA infections. Test performance is not FDA approved in patients less than 64 years old. Performed at [facility], [address] [place], [place] [address]     Radiology Studies: No results found.  Scheduled Meds:  fluticasone furoate-vilanterol  1 puff Inhalation Daily   mouth rinse  15 mL Mouth Rinse 4 times per day   polyethylene glycol  17 g Oral BID   senna-docusate  1 tablet Oral BID   sodium chloride flush  3 mL Intravenous Q12H   Continuous Infusions:     LOS: 6 days   Time spent:  Azucena Fallen, DO [facility]  If 7PM-7AM, please contact night-coverage [email or web address]  12/18/2022, 1:28 PM

## 2022-12-18 NOTE — Progress Notes (Signed)
Text paged Dr. Antionette Char patient still has not vded and no response to Lasix. Bladder scan 308 ml.Cont. to be a RED mews. Tim R.N. aware

## 2022-12-18 NOTE — Progress Notes (Signed)
Tim R.N. spoke with family for 30 minutes about care. Family requesting I.V. fluids be restarted and antibiotics and Lasix to be restarted. Dr. Antionette Char paged and made aware.

## 2022-12-18 NOTE — Progress Notes (Signed)
Was told in report patient is unresponsive to pain stim. Patient remains unresp. And comfort. SKin warm and dry. Resp. even and unlabour. VSS. COnt. To monitor patient.

## 2022-12-18 NOTE — Progress Notes (Signed)
Mindy R.N. Rapid Response nurse was called and made aware of family wanting plan of care to be change and code status reviewed. Miny R.N spoke with family in detail and spoke with Dr. Antionette Char about the above.. I.V. fluids started lasix and Rocephin given. Per M.D. orders.

## 2022-12-18 NOTE — Progress Notes (Signed)
Patient blood pressures dropping to 82/32 per flowsheet MD and rapid response notified patient is DNR limited code status rapid response notified per red mews protocol. Current plan of care in progress safety ensured.

## 2022-12-18 NOTE — Plan of Care (Signed)
[facility] Tonsillectomy/Adenoidectomy, Postoperative PEDS care plan entered in error.

## 2022-12-18 NOTE — Care Management Important Message (Signed)
Important Message  Patient Details  Name: [name] MRN: [id number] Date of Birth: [birth year]   Important Message Given:  Yes - Medicare IM     Sherilyn Banker 12/18/2022, 2:01 PM

## 2022-12-18 NOTE — Progress Notes (Signed)
Daily Progress Note   Patient Name: [name]       Date: 12/18/2022 DOB: 01/07/1923  Age: 89 y.o. MRN#: [id number] Attending Physician: Azucena Fallen, MD Primary Care Physician: Eloisa Northern, MD Admit Date: December 24, 2022  Reason for Consultation/Follow-up: End of life care/ discussion   Length of Stay: 6  Current Medications: Scheduled Meds:   fluticasone furoate-vilanterol  1 puff Inhalation Daily   mouth rinse  15 mL Mouth Rinse 4 times per day   polyethylene glycol  17 g Oral BID   senna-docusate  1 tablet Oral BID   sodium chloride flush  3 mL Intravenous Q12H    Continuous Infusions:   PRN Meds: ipratropium-albuterol, mouth rinse  Physical Exam Vitals reviewed.  Constitutional:      Appearance: She is ill-appearing.     Interventions: Nasal cannula in place.  Cardiovascular:     Rate and Rhythm: Normal rate.  Pulmonary:     Comments: Irregular, shallow, with periods of apnea Skin:    General: Skin is warm and dry.  Neurological:     Mental Status: She is lethargic.             Vital Signs: BP (!) 79/52 (BP Location: Left Arm)   Pulse (!) 45   Temp (!) 97.5 F (36.4 C) (Oral)   Resp 11   Ht 5\' 8"  (1.727 m)   Wt 85.2 kg   SpO2 94%   BMI 28.56 kg/m  SpO2: SpO2: 94 % O2 Device: O2 Device: Nasal Cannula O2 Flow Rate: O2 Flow Rate (L/min): 6 L/min       Palliative Assessment/Data: 10%      Patient Active Problem List   Diagnosis Date Noted   Acute hypoxic respiratory failure (HCC) 12/12/2022   Acute respiratory failure with hypoxemia (HCC) 12/12/2022   Pyogenic inflammation of bone (HCC) 06/04/2022   PAD (peripheral artery disease) (HCC) 06/04/2022   Pressure injury of right leg, unstageable (HCC) 05/28/2022   Bedbound 05/28/2022    GERD (gastroesophageal reflux disease) 05/28/2022   Closed fracture of right distal femur (HCC) 08/07/2021   Periprosthetic fracture around internal prosthetic right knee joint 08/06/2021   Spinal stenosis of thoracolumbar region 11/02/2017   Right shoulder pain 05/24/2015   Primary osteoarthritis of left knee 05/24/2015   Encounter for therapeutic drug  monitoring 09/15/2013   Hip pain 05/02/2012   Fall 05/02/2012   Long term (current) use of anticoagulants 08/08/2010   Osteoarthritis 08/06/2008   Malignant neoplasm of female breast (HCC) 08/03/2008   Essential hypertension 08/03/2008   Atrial fibrillation (HCC) 08/03/2008   Diastolic heart failure (HCC) 08/03/2008   Edema 08/03/2008    Palliative Care Assessment & Plan   Patient Profile: 89 y.o. female  with past medical history of a fib, bedbound, chronic osteomyelitis with gangrene of R heel- amputation was recommended, but she declined admitted on 12/10/2022 with acute hypoxic respiratory failure due to aspiration pneumonia, COPD, decompensated HF, RLE osteomyelitis. SLP eval with high risk for continue aspiration.    Discussion: Extensive chart review has been completed prior to seeing the patient and meeting with [facility] Jan and Christiane Ha including labs, vital signs, imaging, progress/consult notes, orders, medications, and available advance directive documents.  Met with HCPOAs Jan and Christiane Ha at bedside. I reviewed my understanding of the patient's condition, including limitations of ongoing interventions and high risk for further decline despite aggressive treatment efforts. They have just met with Dr. Natale Milch and they understand the patient's poor prognosis. We confirmed the patient is DNR and DNI. We discussed what a full comfort path could look like.  They are having a hard time with not providing any IV hydration to the patient as had shared she did not want nutrition of hydration stopped at end-of-life. We discuss that the  family could prevent discomfort from dry mouth by using moist sponges to keep her mouth moist. We discuss that decreased intake is part of the dying process and expected. We discuss that continuing IV fluids at end of life can lead to discomfort. They express understanding but would like to keep some IV hydration continued but agreed to a very low rate of administration.  The patient's HCPOAs did share that they would like to keep the patient comfortable during her dying time. We discussed how we can manage symptoms should they arise. We discussed the importance of considering her quality of life, overall suffering, what would be acceptable to her during this time.  Provided family a "Gone from my sight" pamphlet to help them understand what they can expect as the patient transitions.   Discussed the importance of continued conversation with family and the medical providers regarding overall plan of care ensuring decisions are within the context of the patient's values and GOCs.  Questions and concerns were addressed. The family was encouraged to call with questions or concerns. PMT will continue to support holistically.  Recommendations/Plan: DNR/ DNI Plan is to keep patient comfortable as  Anticipated hospital death Continued PMT support    Code Status:    Code Status Orders  (From admission, onward)           Start     Ordered   12/17/22 2344  Do not attempt resuscitation (DNR)- Limited -Do Not Intubate (DNI)  (Code Status)  Continuous       Question Answer Comment  If pulseless and not breathing No CPR or chest compressions.   In Pre-Arrest Conditions (Patient Is Breathing and Has A Pulse) Do not intubate. Provide all appropriate non-invasive medical interventions. Avoid ICU transfer unless indicated or required.   Consent: Discussion documented in EHR or advanced directives reviewed      12/17/22 2345           Prognosis:  Hours - Days  Discharge  Planning: Anticipated Hospital Death  Care plan was  discussed with Dr. Natale Milch  Time spent: 65 minutes  Thank you for allowing the Palliative Medicine Team to assist in the care of this patient.     Sherryll Burger, NP  Please contact Palliative Medicine Team phone at [phone] for questions and concerns.

## 2022-12-23 ENCOUNTER — Ambulatory Visit (HOSPITAL_BASED_OUTPATIENT_CLINIC_OR_DEPARTMENT_OTHER): Payer: Medicare PPO | Admitting: Physician Assistant

## 2022-12-30 ENCOUNTER — Ambulatory Visit (HOSPITAL_BASED_OUTPATIENT_CLINIC_OR_DEPARTMENT_OTHER): Payer: Medicare PPO | Admitting: Physician Assistant

## 2023-01-01 NOTE — Plan of Care (Signed)
  Problem: Coping: Goal: Level of anxiety will decrease Outcome: Progressing   Problem: Elimination: Goal: Will not experience complications related to bowel motility Outcome: Progressing Goal: Will not experience complications related to urinary retention Outcome: Progressing   Problem: Pain Managment: Goal: General experience of comfort will improve Outcome: Progressing   Problem: Education: Goal: Knowledge of General Education information will improve Description: Including pain rating scale, medication(s)/side effects and non-pharmacologic comfort measures Outcome: Not Progressing   Problem: Health Behavior/Discharge Planning: Goal: Ability to manage health-related needs will improve Outcome: Not Progressing   Problem: Clinical Measurements: Goal: Will remain free from infection Outcome: Not Progressing   Problem: Activity: Goal: Risk for activity intolerance will decrease Outcome: Not Progressing   Problem: Nutrition: Goal: Adequate nutrition will be maintained Outcome: Not Progressing

## 2023-01-01 NOTE — Progress Notes (Signed)
Notified ME of death awaiting call back.  Family said they would probably go with [name] and [employer] funeral home off [address] in [place]. Family not requesting autopsy.

## 2023-01-01 NOTE — Discharge Summary (Signed)
DEATH SUMMARY   Patient Details  Name: [name] MRN: [id number] DOB: 1922/08/06 [id number], [name], MD Admission/Discharge Information   Admit Date:  12-15-2022  Date of Death: Date of Death: 12/23/22  Time of Death: Time of Death: 0740  Length of Stay: 7   Principle Cause of death: Aspiration pneumonia versus pneumonitis  Hospital Diagnoses: Principal Problem:   Acute hypoxic respiratory failure (HCC) Active Problems:   Acute respiratory failure with hypoxemia [facility])   Hospital Course: 28 white female known history of atrial fibrillation CHADVASC >3 hasbled >3 not on anticoagulation (UGIB EGD 05/28/2015 antral ulcers) intolerant to beta-blocker secondary to bradycardia; Chronically bedbound needs lift; breast cancer right lumpectomy XRT 2002-previously followed at [facility] last seen January 24, 2016; prior right humeral fracture in January 23, 2021.  Patient admitted 2022-12-15 with dyspnea exertion and profound hypoxia.  Sats in the 80s, does not wear oxygen at baseline, EKG shows 3-1 flutter, initially placed on BiPAP for respiratory support.  Patient's baseline is functionally alert, bedbound, able to assist with finances and other complex tasks but relies on others for physical needs and mobility -she is not on oxygen at baseline.   Recent admission 3/28 through 06/05/2022 chronic heel ulcer not a surgical candidate (right femoral fracture 01-23-22) patient was recommended AKA at that time. MRI showed early osteo patient was treated with oral Vantin Doxy X 8 weeks-patient adamantly refused surgery despite osteo on MRI. Follows with wound care [facility] for the wound.  Patient admitted as above with acute hypoxic respiratory failure in the setting of aspiration pneumonia versus pneumonitis as well as possible underlying heart failure exacerbation.  Patient was treated with broad-spectrum antibiotics but unfortunately continued to have worsening mental status, speech evaluated and given  patient's profound risk for aspiration lengthy discussion was had with family about goals of care.  During hospitalization patient made it clear that she did not want any heroic measures, subsequently she became more somnolent, less responsive requiring more oxygen likely due to ongoing aspiration.  Goals of care conversation had again with family and palliative care on the 17th and 18th, ultimately transitioning patient to comfort measures.  Patient passed Dec 23, 2022 at 07 40, hospital death was expected.  Assessment and Plan:  Goals of care discussion -Patient continues as DNR per family discussion -Morphine, Ativan, glycopyrrolate ordered as needed per comfort measures order set -Expect in-hospital death   Acute hypoxic respiratory failure 2/2 aspiration PNA COPD, not in acute decompensation  BiPAP discontinued 10/13 Completed antibiotics   Decompensated HFpEF  Chronic failure COPD Chronic osteomyelitis right lower extremity with dry gangrene superimposed on peripheral vascular disease Hypothyroidism Atrial fibrillation CHADVASC >3 not on anticoagulation     Consultations: Palliative care  The results of significant diagnostics from this hospitalization (including imaging, microbiology, ancillary and laboratory) are listed below for reference.   Significant Diagnostic Studies: DG CHEST PORT 1 VIEW  Result Date: 12/16/2022 CLINICAL DATA:  Pneumonia. EXAM: PORTABLE CHEST 1 VIEW COMPARISON:  Chest x-ray 10-15-202424. FINDINGS: Similar cardiomegaly and interstitial prominence with patchy opacities. Retrocardiac opacity. No visible pneumothorax. Probable small bilateral pleural effusions. No acute bony abnormality. Polyarticular degenerative change. IMPRESSION: 1. Similar cardiomegaly and probable mild interstitial pulmonary edema. Retrocardiac opacity could represent atelectasis or pneumonia. 2. Similar probable small bilateral pleural effusions. Electronically Signed   By: Feliberto Harts M.D.   On: 12/16/2022 11:31   DG Foot Complete Right  Result Date: 12/12/2022 CLINICAL DATA:  Osteomyelitis EXAM: RIGHT FOOT COMPLETE - 3+  VIEW COMPARISON:  05/28/2022 FINDINGS: Osteopenia. No acute fracture or dislocation of the right foot. Angulated chronic fracture deformity of the right fifth metatarsal neck. Bunion deformity of the right great toe with moderate associated arthrosis, as well as severe arthrosis of the second metatarsophalangeal joint. Large posterior calcaneal soft tissue wound with overt bony destruction of the underlying calcaneal process. Diffuse soft tissue edema about the foot and ankle. Vascular calcinosis. IMPRESSION: 1. Large posterior calcaneal soft tissue wound with overt bony destruction of the underlying calcaneal process, consistent with osteomyelitis. MRI is the test of choice for the assessment of osteomyelitis and bone marrow edema. 2. No acute fracture or dislocation of the right foot. Angulated chronic fracture deformity of the right fifth metatarsal neck. 3. Bunion deformity of the right great toe with moderate associated arthrosis, as well as severe arthrosis of the second metatarsophalangeal joint. Electronically Signed   By: Jearld Lesch M.D.   On: 12/12/2022 16:29   CT HEAD WO CONTRAST ( )  Result Date: 12/12/2022 CLINICAL DATA:  89 year old female with encephalopathy. Shortness of breath. EXAM: CT HEAD WITHOUT CONTRAST TECHNIQUE: Contiguous axial images were obtained from the base of the skull through the vertex without intravenous contrast. RADIATION DOSE REDUCTION: This exam was performed according to the departmental dose-optimization program which includes automated exposure control, adjustment of the mA and/or kV according to patient size and/or use of iterative reconstruction technique. COMPARISON:  Head CT 08/06/2021. FINDINGS: Brain: Stable cerebral volume. No midline shift, ventriculomegaly, mass effect, evidence of mass lesion, intracranial  hemorrhage or evidence of cortically based acute infarction. Patchy and confluent bilateral cerebral white matter hypodensity, deep white matter capsule involvement, some deep gray nuclei heterogeneity, chronic occipital pole encephalomalacia on the right are stable. No abnormal enhancement identified. Vascular: Residual intravascular contrast from CTA 0104 hours today. Calcified atherosclerosis at the skull base. Skull: No acute osseous abnormality identified. Sinuses/Orbits: Visualized paranasal sinuses and mastoids are clear. Other: No acute orbit or scalp soft tissue finding. IMPRESSION: 1. No acute intracranial abnormality identified. 2. Stable noncontrast CT appearance of chronic ischemic disease since last year. Electronically Signed   By: Odessa Fleming M.D.   On: 12/12/2022 07:15   DG Chest Portable 1 View  Result Date: 12/12/2022 CLINICAL DATA:  Shortness of breath. EXAM: PORTABLE CHEST 1 VIEW COMPARISON:  March 26, 2020 FINDINGS: The cardiac silhouette is mildly enlarged which may be, in part, secondary to low lung volumes. Mild, diffusely increased interstitial lung markings are noted. Moderate severity areas of atelectasis and/or infiltrate are seen within the right upper lobe and bilateral lung bases. Small bilateral pleural effusions are also present. No pneumothorax is identified. Radiopaque surgical clips are seen along the right axilla and lateral right chest wall. Marked severity degenerative changes are seen involving both shoulders with multilevel degenerative changes noted throughout the thoracic spine. IMPRESSION: 1. Mild cardiomegaly with mild interstitial edema. 2. Moderate severity right upper lobe and bilateral basilar atelectasis and/or infiltrate. 3. Small bilateral pleural effusions. Electronically Signed   By: Aram Candela M.D.   On: 12/12/2022 01:33   CT Angio Chest PE W and/or Wo Contrast  Result Date: 12/12/2022 CLINICAL DATA:  Shortness of breath and difficulty breathing.  EXAM: CT ANGIOGRAPHY CHEST WITH CONTRAST TECHNIQUE: Multidetector CT imaging of the chest was performed using the standard protocol during bolus administration of intravenous contrast. Multiplanar CT image reconstructions and MIPs were obtained to evaluate the vascular anatomy. RADIATION DOSE REDUCTION: This exam was performed according to the departmental dose-optimization program  which includes automated exposure control, adjustment of the mA and/or kV according to patient size and/or use of iterative reconstruction technique. CONTRAST:  70mL OMNIPAQUE IOHEXOL 350 MG/ML SOLN COMPARISON:  None Available. FINDINGS: Cardiovascular: There is mild to moderate severity calcification of the aortic arch and descending thoracic aorta, without evidence of aortic aneurysm. Satisfactory opacification of the pulmonary arteries to the segmental level. No evidence of pulmonary embolism. The heart is borderline in size, with a small (approximately 7 mm thick) pericardial effusion. Mediastinum/Nodes: There is mild pretracheal lymphadenopathy. Thyroid gland, trachea, and esophagus demonstrate no significant findings. Lungs/Pleura: Mild, hazy bilateral upper lobe atelectasis and/or infiltrate is seen with moderate severity areas of linear scarring and/or atelectasis noted within the bilateral upper lobes and right middle lobe. Moderate to marked severity bilateral lower lobe compressive atelectasis is also seen. There are small bilateral pleural effusions. No pneumothorax is identified. Upper Abdomen: No acute abnormality. Musculoskeletal: A fracture deformity, and subsequent wedging, of the L1 vertebral body is seen. This involves predominantly the anterior aspect of the superior endplate and is of indeterminate age. Marked severity degenerative changes are seen involving both shoulders with multilevel degenerative changes noted throughout the thoracic spine. Review of the MIP images confirms the above findings. IMPRESSION: 1. No  evidence of pulmonary embolism. 2. Small bilateral pleural effusions with moderate to marked severity bilateral lower lobe compressive atelectasis. 3. Mild, hazy bilateral upper lobe atelectasis and/or infiltrate with moderate severity areas of bilateral upper lobe and right middle lobe linear scarring and/or atelectasis. 4. Small pericardial effusion. 5. Fracture deformity of the L1 vertebral body of indeterminate age. 6. Aortic atherosclerosis. Aortic Atherosclerosis (ICD10-I70.0). Electronically Signed   By: Aram Candela M.D.   On: 12/12/2022 01:29    Microbiology: Recent Results (from the past 240 hour(s))  SARS Coronavirus 2 by RT PCR (hospital order, performed in [facility] hospital lab) *cepheid single result test* Anterior Nasal Swab     Status: None   Collection Time: 12/23/2022 11:13 PM   Specimen: Anterior Nasal Swab  Result Value Ref Range Status   SARS Coronavirus 2 by RT PCR NEGATIVE NEGATIVE Final    Comment: Performed at [facility], [address] [place], [place] [address]  Blood culture (routine x 2)     Status: None   Collection Time: 12/12/22  2:28 AM   Specimen: BLOOD  Result Value Ref Range Status   Specimen Description BLOOD BLOOD LEFT ARM  Final   Special Requests   Final    BOTTLES DRAWN AEROBIC AND ANAEROBIC Blood Culture adequate volume   Culture   Final    NO GROWTH 5 DAYS Performed at [facility], [address] [place], [place] [address]    Report Status 12/17/2022 FINAL  Final  Blood culture (routine x 2)     Status: None   Collection Time: 12/12/22  2:28 AM   Specimen: BLOOD  Result Value Ref Range Status   Specimen Description BLOOD BLOOD LEFT ARM  Final   Special Requests   Final    BOTTLES DRAWN AEROBIC AND ANAEROBIC Blood Culture adequate volume   Culture   Final    NO GROWTH 5 DAYS Performed at [facility], [address], [place], [place] [address]    Report Status 12/17/2022 FINAL  Final  Respiratory (~20 pathogens)  panel by PCR     Status: None   Collection Time: 12/12/22  5:12 AM   Specimen: Nasopharyngeal Swab; Respiratory  Result  Value Ref Range Status   Adenovirus NOT DETECTED NOT DETECTED Final   Coronavirus 229E NOT DETECTED NOT DETECTED Final    Comment: (NOTE) The Coronavirus on the Respiratory Panel, DOES NOT test for the novel  Coronavirus (2019 nCoV)    Coronavirus HKU1 NOT DETECTED NOT DETECTED Final   Coronavirus NL63 NOT DETECTED NOT DETECTED Final   Coronavirus OC43 NOT DETECTED NOT DETECTED Final   Metapneumovirus NOT DETECTED NOT DETECTED Final   Rhinovirus / Enterovirus NOT DETECTED NOT DETECTED Final   Influenza A NOT DETECTED NOT DETECTED Final   Influenza B NOT DETECTED NOT DETECTED Final   Parainfluenza Virus 1 NOT DETECTED NOT DETECTED Final   Parainfluenza Virus 2 NOT DETECTED NOT DETECTED Final   Parainfluenza Virus 3 NOT DETECTED NOT DETECTED Final   Parainfluenza Virus 4 NOT DETECTED NOT DETECTED Final   Respiratory Syncytial Virus NOT DETECTED NOT DETECTED Final   Bordetella pertussis NOT DETECTED NOT DETECTED Final   Bordetella Parapertussis NOT DETECTED NOT DETECTED Final   Chlamydophila pneumoniae NOT DETECTED NOT DETECTED Final   Mycoplasma pneumoniae NOT DETECTED NOT DETECTED Final    Comment: Performed at [facility], [address], [place], [place] [address]  MRSA Next Gen by PCR, Nasal     Status: None   Collection Time: 12/12/22 11:39 AM   Specimen: Nasal Mucosa; Nasal Swab  Result Value Ref Range Status   MRSA by PCR Next Gen NOT DETECTED NOT DETECTED Final    Comment: (NOTE) The GeneXpert MRSA Assay (FDA approved for NASAL specimens only), is one component of a comprehensive MRSA colonization surveillance program. It is not intended to diagnose MRSA infection nor to guide or monitor treatment for MRSA infections. Test performance is not FDA approved in patients less than 70 years old. Performed at [facility], [address],  [place], [place] [address]     Time spent: 55 minutes  Signed: Carma Leaven DO 20-Dec-2022

## 2023-01-01 NOTE — Progress Notes (Signed)
[name] passed at 7:40am 12/22/2022, Marcy Panning RN and Lesle Chris RN confirmed per order.  Doctor Natale Milch notified of time of death. Nephew and grand nephew at bedside, will notify patient's brother, nephew is emergency contact.  [employer] notified. Family undecided where to send as yet but will notify this nurse when decided and will take belongings home.  Still visiting at this time. Respecting families mourning, will give end of life care when they are ready.

## 2023-01-01 DEATH — deceased
